# Patient Record
Sex: Female | Born: 1955 | Race: White | Hispanic: No | Marital: Married | Smoking: Never smoker
Health system: Southern US, Community
[De-identification: ages and names within clinical notes are randomized; demographics above are authoritative.]

## PROBLEM LIST (undated history)

## (undated) DIAGNOSIS — K589 Irritable bowel syndrome without diarrhea: Secondary | ICD-10-CM

## (undated) DIAGNOSIS — E2740 Unspecified adrenocortical insufficiency: Secondary | ICD-10-CM

## (undated) DIAGNOSIS — G4730 Sleep apnea, unspecified: Secondary | ICD-10-CM

## (undated) DIAGNOSIS — Z9889 Other specified postprocedural states: Secondary | ICD-10-CM

## (undated) DIAGNOSIS — I456 Pre-excitation syndrome: Secondary | ICD-10-CM

## (undated) DIAGNOSIS — K219 Gastro-esophageal reflux disease without esophagitis: Secondary | ICD-10-CM

## (undated) DIAGNOSIS — H269 Unspecified cataract: Secondary | ICD-10-CM

## (undated) DIAGNOSIS — D649 Anemia, unspecified: Secondary | ICD-10-CM

## (undated) DIAGNOSIS — H049 Disorder of lacrimal system, unspecified: Secondary | ICD-10-CM

## (undated) DIAGNOSIS — C4491 Basal cell carcinoma of skin, unspecified: Secondary | ICD-10-CM

## (undated) DIAGNOSIS — I639 Cerebral infarction, unspecified: Secondary | ICD-10-CM

## (undated) DIAGNOSIS — R059 Cough, unspecified: Secondary | ICD-10-CM

## (undated) DIAGNOSIS — N390 Urinary tract infection, site not specified: Secondary | ICD-10-CM

## (undated) DIAGNOSIS — I4891 Unspecified atrial fibrillation: Secondary | ICD-10-CM

## (undated) DIAGNOSIS — Z8619 Personal history of other infectious and parasitic diseases: Secondary | ICD-10-CM

## (undated) DIAGNOSIS — I509 Heart failure, unspecified: Secondary | ICD-10-CM

## (undated) DIAGNOSIS — I10 Essential (primary) hypertension: Secondary | ICD-10-CM

## (undated) DIAGNOSIS — Z87440 Personal history of urinary (tract) infections: Secondary | ICD-10-CM

## (undated) DIAGNOSIS — Z87442 Personal history of urinary calculi: Secondary | ICD-10-CM

## (undated) DIAGNOSIS — R112 Nausea with vomiting, unspecified: Secondary | ICD-10-CM

## (undated) DIAGNOSIS — F419 Anxiety disorder, unspecified: Secondary | ICD-10-CM

## (undated) DIAGNOSIS — E785 Hyperlipidemia, unspecified: Secondary | ICD-10-CM

## (undated) DIAGNOSIS — R05 Cough: Secondary | ICD-10-CM

## (undated) DIAGNOSIS — N183 Chronic kidney disease, stage 3 unspecified: Secondary | ICD-10-CM

## (undated) DIAGNOSIS — M1990 Unspecified osteoarthritis, unspecified site: Secondary | ICD-10-CM

## (undated) HISTORY — PX: URETEROSCOPY: SHX842

## (undated) HISTORY — PX: APPENDECTOMY: SHX54

## (undated) HISTORY — DX: Irritable bowel syndrome, unspecified: K58.9

## (undated) HISTORY — DX: Unspecified atrial fibrillation: I48.91

## (undated) HISTORY — PX: SPINE SURGERY: SHX786

## (undated) HISTORY — PX: CARDIAC ELECTROPHYSIOLOGY STUDY AND ABLATION: SHX1294

## (undated) HISTORY — PX: CERVICAL SPINE SURGERY: SHX589

## (undated) HISTORY — DX: Unspecified cataract: H26.9

## (undated) HISTORY — DX: Hyperlipidemia, unspecified: E78.5

## (undated) HISTORY — DX: Unspecified osteoarthritis, unspecified site: M19.90

## (undated) HISTORY — DX: Pre-excitation syndrome: I45.6

## (undated) HISTORY — DX: Essential (primary) hypertension: I10

## (undated) HISTORY — PX: CARPAL TUNNEL RELEASE: SHX101

## (undated) HISTORY — PX: TOTAL HIP ARTHROPLASTY: SHX124

## (undated) HISTORY — PX: EYE SURGERY: SHX253

## (undated) HISTORY — PX: COLON SURGERY: SHX602

## (undated) HISTORY — DX: Disorder of lacrimal system, unspecified: H04.9

## (undated) HISTORY — PX: JOINT REPLACEMENT: SHX530

## (undated) HISTORY — PX: EYELID CARCINOMA EXCISION: SHX1563

## (undated) HISTORY — PX: OTHER SURGICAL HISTORY: SHX169

## (undated) HISTORY — PX: TOTAL ABDOMINAL HYSTERECTOMY: SHX209

## (undated) SURGERY — CYSTOSCOPY, WITH STENT INSERTION
Anesthesia: Choice | Laterality: Left

---

## 1998-02-09 DIAGNOSIS — Z8619 Personal history of other infectious and parasitic diseases: Secondary | ICD-10-CM

## 1998-02-09 HISTORY — DX: Personal history of other infectious and parasitic diseases: Z86.19

## 2000-02-11 ENCOUNTER — Other Ambulatory Visit: Admission: RE | Admit: 2000-02-11 | Discharge: 2000-02-11 | Payer: Self-pay | Admitting: Obstetrics and Gynecology

## 2001-09-05 ENCOUNTER — Other Ambulatory Visit: Admission: RE | Admit: 2001-09-05 | Discharge: 2001-09-05 | Payer: Self-pay | Admitting: Obstetrics and Gynecology

## 2002-11-09 ENCOUNTER — Other Ambulatory Visit: Admission: RE | Admit: 2002-11-09 | Discharge: 2002-11-09 | Payer: Self-pay | Admitting: Obstetrics and Gynecology

## 2003-11-19 ENCOUNTER — Other Ambulatory Visit: Admission: RE | Admit: 2003-11-19 | Discharge: 2003-11-19 | Payer: Self-pay | Admitting: Obstetrics and Gynecology

## 2004-04-15 ENCOUNTER — Ambulatory Visit (HOSPITAL_COMMUNITY): Admission: RE | Admit: 2004-04-15 | Discharge: 2004-04-15 | Payer: Self-pay | Admitting: Orthopedic Surgery

## 2006-11-26 ENCOUNTER — Encounter: Admission: RE | Admit: 2006-11-26 | Discharge: 2006-11-26 | Payer: Self-pay | Admitting: Orthopedic Surgery

## 2007-01-04 ENCOUNTER — Inpatient Hospital Stay (HOSPITAL_COMMUNITY): Admission: RE | Admit: 2007-01-04 | Discharge: 2007-01-07 | Payer: Self-pay | Admitting: Orthopedic Surgery

## 2007-07-05 ENCOUNTER — Encounter: Admission: RE | Admit: 2007-07-05 | Discharge: 2007-07-05 | Payer: Self-pay | Admitting: Orthopedic Surgery

## 2007-08-02 ENCOUNTER — Inpatient Hospital Stay (HOSPITAL_COMMUNITY): Admission: RE | Admit: 2007-08-02 | Discharge: 2007-08-05 | Payer: Self-pay | Admitting: Orthopedic Surgery

## 2007-12-15 ENCOUNTER — Ambulatory Visit (HOSPITAL_BASED_OUTPATIENT_CLINIC_OR_DEPARTMENT_OTHER): Admission: RE | Admit: 2007-12-15 | Discharge: 2007-12-15 | Payer: Self-pay | Admitting: Family Medicine

## 2009-07-18 ENCOUNTER — Encounter: Admission: RE | Admit: 2009-07-18 | Discharge: 2009-07-18 | Payer: Self-pay | Admitting: Orthopedic Surgery

## 2009-08-08 ENCOUNTER — Ambulatory Visit (HOSPITAL_COMMUNITY): Admission: RE | Admit: 2009-08-08 | Discharge: 2009-08-08 | Payer: Self-pay | Admitting: Orthopedic Surgery

## 2010-03-02 ENCOUNTER — Encounter: Payer: Self-pay | Admitting: Orthopedic Surgery

## 2010-06-24 NOTE — Op Note (Signed)
Connie Haynes, Connie Haynes                  ACCOUNT NO.:  [id number]   MEDICAL RECORD NO.:  [id number]          PATIENT TYPE:  INP   LOCATION:  0006                         FACILITY:  [facility]   PHYSICIAN:  Madlyn Frankel. Charlann Boxer, M.D.  DATE OF BIRTH:  09-Feb-1956   DATE OF PROCEDURE:  08/02/2007  DATE OF DISCHARGE:                               OPERATIVE REPORT   PREOPERATIVE DIAGNOSIS:  Left hip osteoarthritis.   POSTOPERATIVE DIAGNOSIS:  Left hip osteoarthritis.   PROCEDURE:  Left total hip replacement.   COMPONENTS USED:  DePuy hip system size 50 pinnacle cup, a 36 metal  neutral liner, size 2 high Trilock stem with 36 +1.5 ceramic ball.   SURGEON:  Madlyn Frankel. Charlann Boxer, M.D.   ASSISTANT:  [name]. Mann, PA.   ANESTHESIA:  General.   BLOOD LOSS:  450.   DRAINS:  One.   COMPLICATIONS:  None.   INDICATIONS FOR PROCEDURE:  Ms. Deemer is a 55 year old female with  history of bilateral hip osteoarthritis.  She is status post a right hip  resurfacing that failed and was ultimately revised to a hip replacement.  She is here for complaints of left hip pain and wished to proceed with  hip replacement options versus resurfacing.  Given the results she had  with the right hip, she is eager to get this taken care of due to her  pain.  Risks and benefits reviewed including failure of component,  infection, DVT, she had been through this before and consent obtained.   PROCEDURE IN DETAIL:  The patient was brought to operative theater.  Once adequate anesthesia, preoperative antibiotics 2 mg of Ancef  administered, the patient was positioned in the right lateral decubitus  position with left side up.  Bony prominences were padded.  Left lower  extremity was then prescrubbed, prepped and draped in sterile fashion.  A lateral incision was made.  Sharp dissection was carried to the  iliotibial band which was then incised posteriorly into the gluteal  fascia.  The short external rotators were taken down  separate from the  posterior capsule.  Initially, I created an L capsulotomy preserving the  posterior leaflet to protect the sciatic nerve and was attempting to  repair it at the end of the case; however, due to debridement from  reamers the superior capsule was not available.  The hip was very stable  and deemed not necessary, so capsulectomy carried out at the end.  Hip  was dislocated and a neck osteotomy made based off anatomic landmarks  and reviewed under radiographs.  I checked with a trial after the  resection and was happy that the neck cut matched up to the neck length  and head size.   At this point, we attended to the femur.  Femoral retractors were  placed.  I used a starting drill and a box osteotome to assure  laterality of my component.  I hand reamed once irrigated to prevent fat  emboli.  I broached up to a size 2 broach with a 0 setting my  anteversion at 20-25 degrees.  Once I had broached up and this had a  good firm fit, I cleaned up the neck cut with a calcar planer.  I then  packed femur into the acetabulum.   Acetabular retractors were placed and labrectomy carried out and I began  reaming with a 44 reamer straight and with curved reamers up to 49  reamer with good bony bed preparation.  I subsequently impacted a 50 mm  pinnacle cup at 20 degrees of forward flexion and 35-40 degrees of  abduction.  It appeared to be positioned anatomically with good bony  coverage.  A single cancellus screw was used to support the cup.  I  placed a 36 trial liner and sent it back to the femur.  Trial reduction  was carried.  I first used a standard neck based on radiographs.  There  was touch bit of impingement with internal rotation and thus I chose to  use the offset neck and with this with a +1.5 I still had a millimeter  or so of shuck, the hip range of motion was very stable and the leg  lengths were very comparable to the down leg as I compared it in the  preoperative  position.   Given all these parameters, all trial components were removed, the  central hole then replaced in the pinnacle shell and the final 36 metal  liner impacted without any difficulties or complications.   The final 2 high Trilock stem was then impacted.  It sat at the level  where the neck cut was made and had very good torsional control and  stability.  Based on this and my trial reduction, I chose to use the 1.5  ceramic ball to provide her with a hard bearing surface but decrease  metal liner debris.  This was impacted onto the trunnion and the hip  reduced.  Hip was irrigated throughout the case and again at this point.  Again, I attempted to reapproximate the posterior capsule and it was not  positive, so I removed the posterior capsule to prevent impingement.  Medium Hemovac drain was placed deep.  The iliotibial band was  reapproximated using #1 Ethibond, #1 Vicryl was then used on the gluteal  fascia.  The wound was closed with 2-0 Vicryl and running 4-0 Monocryl.  Hip was cleaned, dried and dressed sterilely with Steri-Strips and  sterile dressing.  She was brought to the recovery room in stable  condition tolerating the procedure well.      Madlyn Frankel Charlann Boxer, M.D.  Electronically Signed     MDO/MEDQ  D:  08/02/2007  T:  08/02/2007  Job:  [id number]

## 2010-06-24 NOTE — H&P (Signed)
Connie Haynes, Connie Haynes                  ACCOUNT NO.:  [id number]   MEDICAL RECORD NO.:  [id number]          PATIENT TYPE:  INP   LOCATION:  NA                           FACILITY:  [facility]   PHYSICIAN:  Madlyn Frankel. Charlann Boxer, M.D.  DATE OF BIRTH:  October 15, 1955   DATE OF ADMISSION:  08/02/2007  DATE OF DISCHARGE:                              HISTORY & PHYSICAL   PROCEDURE:  Left total hip arthroplasty.   CHIEF COMPLAINT:  Left hip pain.   HISTORY OF PRESENT ILLNESS:  A 55 year old female with a history of left  hip pain secondary to osteoarthritis.  It has been refractory to all  conservative treatment.  It has required her to use a cane for assisted  ambulation.  She also has a history of right total hip replacement and  has done well with this.   PRIMARY CARE PHYSICIAN:  Birdena Jubilee, MD.   SPECIALIST:  Dr. Luella Cook.   PAST MEDICAL HISTORY:  1. Significant for osteoarthritis.  2. Hypertension.  3. Wolff-Parkinson-White syndrome.   PAST SURGICAL HISTORY:  1. Right hip resurfacing which failed requiring right hip replacement      by Dr. Charlann Boxer.  2. Hysterectomy.  3. Colon surgery.  4. Ablation for Wolff-Parkinson-White back in 1993.   FAMILY HISTORY:  Congestive heart failure, cancer.   SOCIAL HISTORY:  She is married.  She works as a Architectural technologist.  Primary caregiver will be her husband after surgery.   DRUG ALLERGIES:  1. CODEINE causes severe nausea and vomiting.  2. She also had nausea and vomiting after ANESTHESIA last time.   CURRENT MEDICATIONS:  1. Benicar 40/25 one p.o. daily.  2. Premarin 0.3 p.o. daily.  3. Amitiza 24 mcg two times a day with food.  4. Celebrex 200 mg one p.o. daily.  5. Multivitamin p.o. daily.   REVIEW OF SYSTEMS:  None other than the HPI.   PHYSICAL EXAMINATION:  GENERAL:  Awake, alert and oriented, well-  developed, well-nourished in no acute distress.  VITAL SIGNS:  Pulse 72, respirations 18, blood pressure 118/74.  NECK:  Supple.  No  carotid bruits.  CHEST:  Lungs are clear to auscultation bilaterally.  BREASTS:  Deferred.  HEART:  Regular rate and rhythm.  S1-S2 distinct.  ABDOMEN:  Soft, nontender, nondistended.  Bowel sounds are present.  GENITOURINARY:  Deferred.  EXTREMITIES:  Left hip has decreased range of motion, increased pain  with motion.  SKIN:  No cellulitis.  NEUROLOGIC:  Intact distal sensibilities.   DIAGNOSTICS:  Labs, EKG, chest x-ray all pending presurgical testing.   IMPRESSION:  Left hip osteoarthritis.   PLAN OF ACTION:  Left total hip arthroplasty at [facility] by  surgeon Dr. Durene Romans on August 02, 2007.  Risks and complications were  discussed.   PLAN:  1. Postoperative medications were provided at time of history and      physical including Lovenox Robaxin, iron, aspirin, Colace, MiraLax.      Pain medicines will be provided after surgery.  2. Pain control will be to utilize Toradol plus Darvocet as she  has      severe nausea and vomiting with any codeine or codeine derivatives.      At time of discharge, more than likely with utilize Darvocet for      pain control.     ______________________________  [name]. [address], [place]      Madlyn Frankel. Charlann Boxer, M.D.  Electronically Signed    BLM/MEDQ  D:  07/29/2007  T:  07/29/2007  Job:  [id number]   cc:   Birdena Jubilee, MD  Fax: [phone]

## 2010-06-24 NOTE — H&P (Signed)
NAMEELENORA, Connie Haynes                  ACCOUNT NO.:  [id number]   MEDICAL RECORD NO.:  [id number]          PATIENT TYPE:  INP   LOCATION:  NA                           FACILITY:  [facility]   PHYSICIAN:  Madlyn Frankel. Charlann Boxer, M.D.  DATE OF BIRTH:  March 08, 1955   DATE OF ADMISSION:  01/04/2007  DATE OF DISCHARGE:                              HISTORY & PHYSICAL   PROCEDURE:  Conversion right total hip arthroplasty.   CHIEF COMPLAINTS:  Right hip pain.   HISTORY OF PRESENT ILLNESS:  A 55 year old female seen in our office  with right hip and groin pain after a history of right hip resurfacing  surgery in 2007.  She has had a significant pain, 100% worse than she  was before surgery.  There has been no improvement in her overall pain  relief, strengthening, function, and has decreased quality of life as a  result.  There has been no source of infection noted with laboratory  testing.  After significant discussion with the patient, surgery with  conversion to total hip replacement would be needed to improve quality  of life and function.  She was presurgically assessed by her primary  care physician, Dr. Birdena Jubilee, with referral for full cardiology  workup before surgery.   PAST MEDICAL HISTORY:  Significant for:  1. Osteoarthritis.  2. Hypertension.  3. Wolff-Parkinson-White.   PAST SURGICAL HISTORY:  1. Detached colon 1985.  2. Hysterectomy 1989.  3. Ablation 1993.  4. Hip resurfacing 2007.   FAMILY HISTORY:  Heart disease, hypertension, cancer, arthritis.   SOCIAL HISTORY:  Married.  Primary caregiver after surgery will be  husband and friends.   DRUG ALLERGIES:  CODEINE.   MEDICATIONS:  1. Celebrex 200 mg p.o. b.i.d.  2. Premarin 0.3 mg daily.  3. Benicar 40/25 one p.o. daily.  4. Amitiza 24 mcg p.o. daily.  5. Multivitamin daily.   REVIEW OF SYSTEMS:  PULMONARY:  History of bronchitis and cough.  GENITOURINARY:  History of kidney stones.  Otherwise, see HPI.   PHYSICAL  EXAMINATION:  Pulse 72, respirations 18, blood pressure 114/76.  GENERAL:  Awake, alert and oriented, well-developed, well-nourished, in  no acute distress.  NECK:  Supple.  No carotid bruits.  CHEST:  Lungs clear to auscultation bilaterally.  BREASTS:  Deferred.  HEART:  Regular rate and rhythm without murmurs.  ABDOMEN:  Soft, nontender, nondistended.  Bowel sounds present.  GENITOURINARY:  Deferred.  EXTREMITIES:  Decreased range of motion right hip with increased pain  with any range of motion.  SKIN:  Old posterolateral incision well healed.  No cellulitis.  NEUROLOGIC:  Intact distal sensibilities.   LABORATORIES:  EKG attached with stress echocardiogram to follow.  Chest  x-ray pending.   IMPRESSION:  1. Failed right hip resurfacing.  2. Hypertension.  3. Wolff-Parkinson-White.   PLAN OF ACTION:  Conversion right total hip arthroplasty by surgeon Dr.  Durene Romans.  Risks and complications were discussed with Dr. Charlann Boxer.   Postoperative medications including Lovenox, Robaxin, iron, Colace,  MiraLax provided at time of history and physical.  Postoperative pain  medication will be provided at time of surgery.     ______________________________  [name] [address], [place]      Madlyn Frankel. Charlann Boxer, M.D.  Electronically Signed    BLM/MEDQ  D:  12/30/2006  T:  12/31/2006  Job:  [id number]   cc:   Birdena Jubilee, MD  Fax: [phone]

## 2010-06-24 NOTE — Op Note (Signed)
Connie Haynes, Connie Haynes                  ACCOUNT NO.:  [id number]   MEDICAL RECORD NO.:  [id number]          PATIENT TYPE:  INP   LOCATION:  X004                         FACILITY:  [facility]   PHYSICIAN:  Madlyn Frankel. Charlann Boxer, M.D.  DATE OF BIRTH:  Feb 14, 1955   DATE OF PROCEDURE:  01/04/2007  DATE OF DISCHARGE:                               OPERATIVE REPORT   PREOPERATIVE DIAGNOSIS:  Failed right hip resurfacing   POSTOPERATIVE DIAGNOSIS:  Failed right hip resurfacing   PROCEDURE:  Revision right total hip replacement with a Biomet hip  system, size 54 Regenerex cup, single cancellous screw, a 36 +5 vitamin  E knee polyethylene insert, neutral with a size 5 standard taper lock  stem with 36+ plus 0 Delta ceramic ball.   SURGEON:  Madlyn Frankel. Charlann Boxer, M.D.   ASSISTANT:  Dwyane Luo.   ANESTHESIA:  Spinal.   BLOOD LOSS:  500 mL.   DRAINS:  x1.   COMPLICATIONS:  None.   INDICATIONS FOR THE PROCEDURE:  Connie Haynes is a very pleasant 56 year old  female status post a right hip resurfacing done over a year ago with  persistent pain and inability to function normally.  Radiographs were  concerning for malposition of the cup versus complications related to  implant insertion.   I reviewed with her the concern, rule out infection of the perioperative  plate.  We reviewed the risks of persistent discomfort, persistent  weakness, persistent pain as well as increased risk with revision  surgery of dislocation, infection.  Consent was obtained for the benefit  of pain relief and increased function.   PROCEDURE IN DETAIL:  The patient was brought to operative theater.  Once adequate anesthesia and preoperative antibiotics, Ancef, were  administered, the patient was positioned in the left lateral decubitus  position with the right side up.  The right lower extremity prescrubbed  and prepped and draped in a sterile fashion.  A portion of her lateral  incision was made and sharp dissection was carried down to  the  iliotibial band, and gluteus fascia was incised posteriorly for  posterior approach.  A pseudocapsular layer was identified and taken  down off the posterior aspect of the hip joint and as well as at the  border the gluteus medius.  Flap was created and joint identified.  Using a bone hook, I was able to dislocate the hip joint.  It was  immediately evident at this point there was no fracture, and there was  no evidence of an obviously loose cup or femoral head component.  At  this point, I made a neck osteotomy into the trochanteric fossa.  I used  an oscillating saw down to the stem portion of this resurfaced femoral  component and then used a sharp osteotome to debride the remaining neck  without complication.  At this point, exposure of the femur was carried  out with the debridement of synovium and capsule around the area.  With  the femur exposed, I would used a starting drill and then a hand reamer  to open up the femoral canal.  Then began broaching with a size 5  broach.  This broach sat very tight within the proximal calcar.  I  milled the proximal area to match this of the neck so I could better fit  my trial components.  Following placement of this, I did attend down to  the acetabulum, with the femur rotated anteriorly, I was able to expose  the acetabulum without difficulty.  Synovial capsular debridement was  carried out as necessary preserving the posterior leaflet for protection  against the sciatic nerve with retractors and then later to be used in  anatomic repair.  Cup was exposed and the rim identified.  I used an  osteotome.  After approximately 15-20 minutes of work with care not to  cause bone loss, I was able to remove the cup.  It did have some  fixation, but there was no significant bone loss upon removal.  At this  point, I began reaming with a 51 reamer.  She had a 52 cup in place.  I  reamed up to 53 reamer at which point I felt there was good bony   fixation.  I chose a 54 Regenerex cup to improve my fixation with the  holes placed anterior.  The cup had initial excellent scratch fit at 35-  40 degrees of abduction, 20 degrees of flexion.  Single cancellous screw  was used to augment this with an excellent purchase.  Trial liner was  placed, and attention was now directed to a trial reduction.  With a  size 5 femoral stem at basically 20 degrees of anteversion, I used a  standard neck and a 36 +0 ball.  The hip was very stable.  Leg lengths  appeared to be more equal.  She was probably at least a centimeter short  on this right side preoperatively.   At this point, all trial components were removed.  The final size 5  standard stem was then impacted into the level of the neck cut.  The e-  poly 36 +5 neutral liner was impacted into the shell without difficulty.  Based on the position of the trials and the position of the final  components, the final 36 +0 standard ceramic ball was impacted on a  clean and dried trunnion and the hip reduced.  Hip stability was noted  to be as good as the trial reduction.  I irrigated the wound throughout  the case and again at this point.  The posterior pseudocapsular layer  was reapproximated to the superior pseudocapsule layer.   A medium Hemovac drain was placed deep.  The iliotibial band and gluteal  fascia was reapproximated using a #1 Vicryl.  The remaining wound was  closed with 2-0 Vicryl and a running 4-0 Monocryl.  The hip was cleaned,  dried and dressed sterilely with Steri-Strips and a Mepilex dressing.  She was brought to the recovery room in stable condition.      Madlyn Frankel Charlann Boxer, M.D.  Electronically Signed     MDO/MEDQ  D:  01/04/2007  T:  01/04/2007  Job:  [id number]

## 2010-06-27 NOTE — Discharge Summary (Signed)
NAMETALULA, ISLAND                  ACCOUNT NO.:  [id number]   MEDICAL RECORD NO.:  [id number]          PATIENT TYPE:  INP   LOCATION:  1621                         FACILITY:  [facility]   PHYSICIAN:  Madlyn Frankel. Charlann Boxer, M.D.  DATE OF BIRTH:  02-12-1955   DATE OF ADMISSION:  01/04/2007  DATE OF DISCHARGE:  01/07/2007                               DISCHARGE SUMMARY   ADMITTING DIAGNOSES:  1. Osteoarthritis status post right hip resurfacing.  2. Hypertension.  3. Wolff-Parkinson-White.   DISCHARGE DIAGNOSES:  1. Osteoarthritis.  2. Hypertension.  3. Wolff-Parkinson-White.  4. Postoperative hypokalemia.   HISTORY OF PRESENT ILLNESS:  [name] is a very pleasant 55 year old  female with a history of right hip and groin pain after history of hip  resurfacing in 2007.  She had a significant amount of pain, 100% worse  than she was before surgery.  She had no improvement in overall pain  relief.  Her strength and functioning and quality of life were all  decreased.  No source of infection noted with laboratory testing.  After  significant discussion with the patient, surgery with converting to  total hip replacement was needed to improve quality of life and  function.  She was presurgically assessed by her primary care physician,  Dr. Birdena Jubilee, with referral for full cardiology workup before  surgery.   LABORATORIES:  Preadmission CBC, hematocrit 37.7; at discharge was 25.3  and stable.  Coags all negative.  Routine chemistry:  Glucose 102, all  others normal.  During her hospital stay her potassium dipped down to  3.3 and at discharge her sodium was 134, potassium 3.3, glucose was 120.  Kidney function normal, GFR greater than 60, her calcium was 7.9 at  discharge.  Routine chemistry, GI workup negative.  UA showed cloudy,  small leukocyte esterase, many epithelials and some hyaline casts.   RADIOLOGY:  Chest two-view:  No active disease.  Pelvis one-view:  Right  total hip replacement  in satisfactory position.   CARDIOLOGY:  EKG showed nonspecific changes unchanged from previous EKG  and cleared by cardiology.   PROCEDURE:  Revision right total hip replacement.  Surgeon:  Dr. Durene Romans.  Assistant:  Dwyane Luo, PA-C.  Components were metal on poly.   HOSPITAL COURSE:  The patient underwent revision right total hip  replacement, tolerated procedure well, was admitted to orthopedic floor.  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  She did remain afebrile throughout her course of  stay.  She states she felt much better even on postoperative day #1  compared with her last surgery.  Hemovac was pulled, dressing was  changed on a daily basis after postoperative day #1.  There was no  significant drainage from the wound at any time.  She was  neurovascularly intact of her right lower extremity throughout.  She was  weightbearing as tolerated during her physical therapy.  We held her  blood pressure medicines and gave her some K-Dur.  Day #2, again  replaced a little bit of potassium.  She was afebrile.  She was walking  without pain.  Day #3, doing extremely well, ready to go home.   DISCHARGE DISPOSITION:  Stable and improved condition with home health  care PT.   DISCHARGE PHYSICAL THERAPY:  She is weightbearing as tolerated with the  use of a rolling walker.   DISCHARGE DIET:  No restrictions.   DISCHARGE WOUND CARE:  Keep dry.   DISCHARGE MEDICATIONS:  1. Lovenox 40 mg subcu q.24h. x11 days.  2. Robaxin 500 mg p.o. q.6h.  3. Iron 325 mg one p.o. t.i.d. x3 weeks.  4. Enteric-coated aspirin 325 mg one p.o. daily x4 weeks after Lovenox      completed.  5. Colace 100 mg p.o. b.i.d.  6. MiraLax 17 g p.o. daily.  7. Vicodin 5/325 one to two p.o. q.4-6h. p.r.n. pain.  8. Premarin 0.3 mg p.o. q.a.m.  9. Benicar 40/25 one p.o. q.a.m.  10.Amitiza 24 mcg one b.i.d.  11.Celebrex 200 mg one p.o. b.i.d. x1 week.   12.Multivitamin daily.   DISCHARGE FOLLOWUP:  Follow up with Dr. Charlann Boxer at phone number [phone].     ______________________________  [name]. [address], [place]      Madlyn Frankel. Charlann Boxer, M.D.  Electronically Signed    BLM/MEDQ  D:  01/18/2007  T:  01/18/2007  Job:  [id number]   cc:   Birdena Jubilee, MD  Fax: [phone]

## 2010-06-27 NOTE — Discharge Summary (Signed)
Connie Haynes, [name]                  ACCOUNT NO.:  [id number]   MEDICAL RECORD NO.:  [id number]          PATIENT TYPE:  INP   LOCATION:  1607                         FACILITY:  [facility]   PHYSICIAN:  Madlyn Frankel. Charlann Boxer, M.D.  DATE OF BIRTH:  1955-03-30   DATE OF ADMISSION:  08/02/2007  DATE OF DISCHARGE:  08/05/2007                               DISCHARGE SUMMARY   ADMITTING DIAGNOSES:  1. Osteoarthritis.  2. Hypertension.  3. Wolff-Parkinson-White syndrome.   DISCHARGE DIAGNOSES:  1. Osteoarthritis.  2. Hypertension.  3. Wolff-Parkinson-White syndrome.   HISTORY OF PRESENT ILLNESS:  A 55 year old female with a history of left  hip pain secondary to osteoarthritis.  Refractory to all conservative  treatment.   PROCEDURE:  Left total hip arthroplasty by surgeon Dr. Durene Romans.  Assistant, Dwyane Luo, PA-C.   LABS PREADMISSION:  CBC, hemoglobin/hematocrit, platelets respectively  were 13.7, 39.6, and 252,000.  At time of discharge hemoglobin 9.2,  hematocrit 26.8, and platelets 200,000.  White cell differential on  admission was all within normal limits.  Coagulation, her PTT was 23,  otherwise all normal.  Routine chemistry on admission sodium 139,  potassium 3.7, glucose 79, creatinine 0.86.  Time of discharge sodium  137, potassium 3.8, glucose 96, creatinine 0.79.  Kidney function, GFR  greater than 60 throughout, her calcium is 8.4 at time of discharge.  UA  showed a trace leukocyte esterase and some hyaline casts.   Cardiology EKG normal sinus rhythm.   HOSPITAL COURSE:  The patient admitted to hospital underwent left total  hip arthroplasty, tolerated procedure well.  She remained  hemodynamically stable throughout the course of stay.  Day 1, we pulled  her Hemovac drain from her hip.  The dressing was dry.  She is  neurovascularly intact to the left lower extremity.  She was  weightbearing as tolerated.  She made good progress with physical  therapy.  Pain was  well-controlled with her pain management regimen.  She had minimal nausea and vomiting.  She did extremely well.  By day 3  was stable.  Made good progress in physical therapy and was ready to go  home.   DISCHARGE DISPOSITION:  Discharged home, stable improved condition.   DISCHARGE PHYSICAL THERAPY:  Weightbearing as tolerated with use of  rolling walker.   DISCHARGE DIET:  Regular.   DISCHARGE WOUND CARE:  Keep dry.   DISCHARGE MEDICATIONS:  1. Lovenox 40 mg subcu q.24 times 11 days.  2. Robaxin 500 mg q. p.o. q.6.  3. Iron 325 mg p.o. t.i.d. x2 weeks.  4. Aspirin 325 mg p.o. daily x4 weeks after Lovenox.  5. Colace 100 mg p.o. b.i.d.  6. MiraLax 17 grams p.o. daily.  7. Darvocet N 100 one to two p.o. q.4 to 6 p.r.n. moderate pain.  8. Dilaudid 2 mg 1 to 2 p.o. q.4 to 6 p.r.n. severe pain.  9. Celebrex 2 mg p.o. b.i.d.  10.Multivitamin daily.  11.Benicar 40/25 one p.o. q. a.m.  12.Premarin 0.3 mg p.o. q. a.m.  13.Amitiza 24 mg IV as twice daily.   DISCHARGE  FOLLOWUP:  Follow up with Dr. Charlann Boxer at phone number [phone] in  2 weeks for wound check.     ______________________________  Connie Haynes. Connie Haynes, Connie Haynes      Madlyn Frankel. Charlann Boxer, M.D.  Electronically Signed    BLM/MEDQ  D:  08/29/2007  T:  08/29/2007  Job:  [id number]   cc:   Birdena Jubilee, MD  Fax: [phone]

## 2010-11-06 LAB — CBC
HCT: 26.8 — ABNORMAL LOW
HCT: 28.1 — ABNORMAL LOW
HCT: 39.6
Hemoglobin: 13.7
Hemoglobin: 9.2 — ABNORMAL LOW
Hemoglobin: 9.8 — ABNORMAL LOW
MCHC: 34.4
MCHC: 34.7
MCHC: 34.7
MCV: 88.7
MCV: 89.2
MCV: 89.4
Platelets: 200
Platelets: 215
Platelets: 252
RBC: 3 — ABNORMAL LOW
RBC: 3.16 — ABNORMAL LOW
RBC: 4.44
RDW: 13.4
RDW: 13.6
RDW: 13.6
WBC: 7.3
WBC: 7.4
WBC: 7.8

## 2010-11-06 LAB — TYPE AND SCREEN
ABO/RH(D): O POS
Antibody Screen: NEGATIVE

## 2010-11-06 LAB — URINE MICROSCOPIC-ADD ON

## 2010-11-06 LAB — BASIC METABOLIC PANEL
BUN: 14
BUN: 17
BUN: 22
CO2: 24
CO2: 26
CO2: 29
Calcium: 8.4
Calcium: 8.6
Calcium: 9.3
Chloride: 101
Chloride: 106
Chloride: 108
Creatinine, Ser: 0.79
Creatinine, Ser: 0.79
Creatinine, Ser: 0.86
GFR calc Af Amer: >60
GFR calc Af Amer: >60
GFR calc Af Amer: >60
GFR calc non Af Amer: >60
GFR calc non Af Amer: >60
GFR calc non Af Amer: >60
Glucose, Bld: 128 — ABNORMAL HIGH
Glucose, Bld: 79
Glucose, Bld: 96
Potassium: 3.7
Potassium: 3.8
Potassium: 4.2
Sodium: 137
Sodium: 139
Sodium: 139

## 2010-11-06 LAB — URINALYSIS, ROUTINE W REFLEX MICROSCOPIC
Bilirubin Urine: NEGATIVE
Glucose, UA: NEGATIVE
Hgb urine dipstick: NEGATIVE
Ketones, ur: NEGATIVE
Nitrite: NEGATIVE
Protein, ur: NEGATIVE
Specific Gravity, Urine: 1.017
Urobilinogen, UA: 0.2
pH: 5

## 2010-11-06 LAB — DIFFERENTIAL
Basophils Absolute: 0.1
Basophils Relative: 1
Eosinophils Absolute: 0.2
Eosinophils Relative: 3
Lymphocytes Relative: 30
Lymphs Abs: 2.2
Monocytes Absolute: 0.4
Monocytes Relative: 6
Neutro Abs: 4.4
Neutrophils Relative %: 60

## 2010-11-06 LAB — PROTIME-INR
INR: 0.9
Prothrombin Time: 12.2

## 2010-11-06 LAB — APTT: aPTT: 23 — ABNORMAL LOW

## 2010-11-18 LAB — COMPREHENSIVE METABOLIC PANEL
ALT: 18
AST: 17
Albumin: 4
Alkaline Phosphatase: 79
BUN: 19
CO2: 27
Calcium: 9.6
Chloride: 105
Creatinine, Ser: 0.86
GFR calc Af Amer: >60
GFR calc non Af Amer: >60
Glucose, Bld: 102 — ABNORMAL HIGH
Potassium: 4
Sodium: 140
Total Bilirubin: 0.8
Total Protein: 7.1

## 2010-11-18 LAB — PROTIME-INR
INR: 0.9
Prothrombin Time: 12.5

## 2010-11-18 LAB — URINALYSIS, ROUTINE W REFLEX MICROSCOPIC
Bilirubin Urine: NEGATIVE
Glucose, UA: NEGATIVE
Hgb urine dipstick: NEGATIVE
Ketones, ur: NEGATIVE
Nitrite: NEGATIVE
Protein, ur: NEGATIVE
Specific Gravity, Urine: 1.028
Urobilinogen, UA: 0.2
pH: 5

## 2010-11-18 LAB — CBC
HCT: 25.3 — ABNORMAL LOW
HCT: 26.2 — ABNORMAL LOW
HCT: 37.7
Hemoglobin: 13.1
Hemoglobin: 8.7 — ABNORMAL LOW
Hemoglobin: 9 — ABNORMAL LOW
MCHC: 34.2
MCHC: 34.5
MCHC: 34.7
MCV: 88.7
MCV: 89.6
MCV: 89.9
Platelets: 207
Platelets: 218
Platelets: 344
RBC: 2.83 — ABNORMAL LOW
RBC: 2.91 — ABNORMAL LOW
RBC: 4.25
RDW: 12.7
RDW: 12.7
RDW: 12.8
WBC: 5.6
WBC: 6
WBC: 9

## 2010-11-18 LAB — ABO/RH: ABO/RH(D): O POS

## 2010-11-18 LAB — BASIC METABOLIC PANEL
BUN: 9
BUN: 9
CO2: 26
CO2: 28
Calcium: 7.7 — ABNORMAL LOW
Calcium: 7.9 — ABNORMAL LOW
Chloride: 102
Chloride: 106
Creatinine, Ser: 0.65
Creatinine, Ser: 0.67
GFR calc Af Amer: >60
GFR calc Af Amer: >60
GFR calc non Af Amer: >60
GFR calc non Af Amer: >60
Glucose, Bld: 116 — ABNORMAL HIGH
Glucose, Bld: 120 — ABNORMAL HIGH
Potassium: 3.3 — ABNORMAL LOW
Potassium: 3.3 — ABNORMAL LOW
Sodium: 134 — ABNORMAL LOW
Sodium: 138

## 2010-11-18 LAB — URINE MICROSCOPIC-ADD ON

## 2010-11-18 LAB — TYPE AND SCREEN
ABO/RH(D): O POS
Antibody Screen: NEGATIVE

## 2010-11-18 LAB — APTT: aPTT: 27

## 2011-02-16 ENCOUNTER — Other Ambulatory Visit (HOSPITAL_BASED_OUTPATIENT_CLINIC_OR_DEPARTMENT_OTHER): Payer: Self-pay | Admitting: Family Medicine

## 2011-02-16 ENCOUNTER — Ambulatory Visit (HOSPITAL_BASED_OUTPATIENT_CLINIC_OR_DEPARTMENT_OTHER)
Admission: RE | Admit: 2011-02-16 | Discharge: 2011-02-16 | Disposition: A | Discharge status: Home / Self Care | Payer: BC Managed Care – PPO | Source: Ambulatory Visit | Attending: Family Medicine | Admitting: Family Medicine

## 2011-02-16 DIAGNOSIS — R05 Cough: Secondary | ICD-10-CM

## 2011-02-16 DIAGNOSIS — R0989 Other specified symptoms and signs involving the circulatory and respiratory systems: Secondary | ICD-10-CM

## 2011-02-16 DIAGNOSIS — R059 Cough, unspecified: Secondary | ICD-10-CM

## 2011-02-16 DIAGNOSIS — J8410 Pulmonary fibrosis, unspecified: Secondary | ICD-10-CM

## 2011-04-21 ENCOUNTER — Encounter: Payer: Self-pay | Admitting: *Deleted

## 2011-04-21 ENCOUNTER — Other Ambulatory Visit: Payer: BC Managed Care – PPO

## 2011-04-21 ENCOUNTER — Ambulatory Visit (INDEPENDENT_AMBULATORY_CARE_PROVIDER_SITE_OTHER): Payer: BC Managed Care – PPO | Admitting: Critical Care Medicine

## 2011-04-21 DIAGNOSIS — R05 Cough: Secondary | ICD-10-CM

## 2011-04-21 DIAGNOSIS — N200 Calculus of kidney: Secondary | ICD-10-CM | POA: Insufficient documentation

## 2011-04-21 DIAGNOSIS — E785 Hyperlipidemia, unspecified: Secondary | ICD-10-CM

## 2011-04-21 DIAGNOSIS — M1990 Unspecified osteoarthritis, unspecified site: Secondary | ICD-10-CM | POA: Insufficient documentation

## 2011-04-21 DIAGNOSIS — R059 Cough, unspecified: Secondary | ICD-10-CM

## 2011-04-21 DIAGNOSIS — K589 Irritable bowel syndrome without diarrhea: Secondary | ICD-10-CM | POA: Insufficient documentation

## 2011-04-21 DIAGNOSIS — I456 Pre-excitation syndrome: Secondary | ICD-10-CM

## 2011-04-21 DIAGNOSIS — I10 Essential (primary) hypertension: Secondary | ICD-10-CM

## 2011-04-21 MED ORDER — HYDROCOD POLST-CPM POLST ER 10-8 MG PO CP12: 1.0000 | ORAL_CAPSULE | Freq: Two times a day (BID) | ORAL | Status: DC | PRN

## 2011-04-21 MED ORDER — OMEPRAZOLE MAGNESIUM 20 MG PO TBEC: 20.0000 mg | DELAYED_RELEASE_TABLET | Freq: Two times a day (BID) | ORAL | Status: DC

## 2011-04-21 MED ORDER — PREDNISONE 10 MG PO TABS: ORAL_TABLET | ORAL | Status: DC

## 2011-04-21 MED ORDER — TRAMADOL HCL 50 MG PO TABS: ORAL_TABLET | ORAL | Status: DC

## 2011-04-21 MED ORDER — BENZONATATE 200 MG PO CAPS: ORAL_CAPSULE | ORAL | Status: DC

## 2011-04-21 NOTE — Patient Instructions (Addendum)
Use Tramadol Connie Haynes per cough protocol Labs today Stop dulera Stop fish oil Increase omeprazole to twice daily 1/2 before a meal Prednisone 10mg  Take 4 for two days three for two days two for two days one for two days Follow cough protocol Strict reflux diet Return 4 weeks

## 2011-04-21 NOTE — Progress Notes (Signed)
Subjective:    Patient ID: Connie Haynes, female    DOB: October 16, 1955, 56 y.o.   MRN: [id number] 56 y.o. WF with cyclical cough but no true asthma or rhinitis/sinusitis HPI Comments: Chronic cough since whooping cough episode in 2000 Now since 11/12 cough worsening . ? Etiology. Worse 12/12 and Rx abx and not improved 1/13 CXR not significant Now over past week worse.  Cough This is a chronic problem. The current episode started more than 1 year ago. The problem has been gradually worsening. The problem occurs every few minutes (Worse at night, and is nonstop then). The cough is non-productive. Associated symptoms include headaches, shortness of breath and wheezing. Pertinent negatives include no chest pain, chills, ear congestion, ear pain, fever, heartburn, hemoptysis, myalgias, nasal congestion, postnasal drip, rash, rhinorrhea or sore throat. Associated symptoms comments: Pt stays hoarse. The symptoms are aggravated by lying down and cold air. She has tried a beta-agonist inhaler and steroid inhaler (steroid shot has helped. dulera has not helped) for the symptoms. There is no history of asthma, bronchiectasis, bronchitis, COPD, emphysema, environmental allergies or pneumonia.    Past Medical History  Diagnosis Date  . Wolff-Parkinson-White (WPW) syndrome   . Hypertension   . Osteoarthritis   . Hyperlipidemia   . IBS (irritable bowel syndrome)   . Kidney stones      Family History  Problem Relation Age of Onset  . Leukemia Brother   . Heart disease Mother   . Heart disease Maternal Grandfather   . Hypertension Mother   . Hyperlipidemia Mother   . Cancer Paternal Grandfather   . Cancer Maternal Uncle     bone     History   Social History  . Marital Status: Married    Spouse Name: N/A    Number of Children: N/A  . Years of Education: N/A   Occupational History  . Geologist, engineering    Social History Main Topics  . Smoking status: Never Smoker   . Smokeless tobacco:  Never Used  . Alcohol Use: No  . Drug Use: No  . Sexually Active: Not on file   Other Topics Concern  . Not on file   Social History Narrative  . No narrative on file     Allergies  Allergen Reactions  . Codeine     vomiting     No outpatient prescriptions prior to visit.      Review of Systems  Constitutional: Positive for activity change and unexpected weight change. Negative for fever, chills, diaphoresis, appetite change and fatigue.  HENT: Positive for congestion and voice change. Negative for hearing loss, ear pain, nosebleeds, sore throat, facial swelling, rhinorrhea, sneezing, mouth sores, trouble swallowing, neck pain, neck stiffness, dental problem, postnasal drip, sinus pressure, tinnitus and ear discharge.   Eyes: Positive for discharge. Negative for photophobia, itching and visual disturbance.  Respiratory: Positive for cough, choking, chest tightness, shortness of breath and wheezing. Negative for apnea, hemoptysis and stridor.   Cardiovascular: Positive for palpitations. Negative for chest pain and leg swelling.  Gastrointestinal: Positive for vomiting. Negative for heartburn, nausea, abdominal pain, constipation, blood in stool and abdominal distention.  Genitourinary: Negative for dysuria, urgency, frequency, hematuria, flank pain, decreased urine volume and difficulty urinating.  Musculoskeletal: Positive for joint swelling and arthralgias. Negative for myalgias, back pain and gait problem.  Skin: Negative for color change, pallor and rash.  Neurological: Positive for dizziness, tremors, weakness, light-headedness and headaches. Negative for seizures, syncope, speech difficulty and numbness.  Hematological: Negative for environmental allergies and adenopathy. Does not bruise/bleed easily.  Psychiatric/Behavioral: Positive for sleep disturbance and agitation. Negative for confusion. The patient is not nervous/anxious.        Objective:   Physical Exam  Filed  Vitals:   04/21/11 1107  BP: 150/100  Pulse: 73  Temp: 97.6 F (36.4 C)  TempSrc: Oral  Height: 5\' 5"  (1.651 m)  Weight: 244 lb 9.6 oz (110.95 kg)  SpO2: 99%    Gen: Pleasant,obese , in no distress,  normal affect  ENT: No lesions,  mouth clear,  oropharynx clear, no postnasal drip  Neck: No JVD, no TMG, no carotid bruits  Lungs: No use of accessory muscles, no dullness to percussion, prominent pseudowheeze  Cardiovascular: RRR, heart sounds normal, no murmur or gallops, no peripheral edema  Abdomen: soft and NT, no HSM,  BS normal  Musculoskeletal: No deformities, no cyanosis or clubbing  Neuro: alert, non focal  Skin: Warm, no lesions or rashes   CXR : NAD        Assessment & Plan:   Cough Cyclical cough without true asthma No evident sinus disease Suspect due to upper airway instability and GERD/LPR Doubt mold is an issue but will check hypersens panel  Plan  Use Tramadol Kimberlee Nearing per cough protocol Labs today>>>rast assay/hypersensitivity panel Stop dulera Stop fish oil Increase omeprazole to twice daily 1/2 before a meal Prednisone 10mg  Take 4 for two days three for two days two for two days one for two days Strict reflux diet Return 4 weeks      Updated Medication List Outpatient Encounter Prescriptions as of 04/21/2011  Medication Sig Dispense Refill  . benzonatate (TESSALON) 200 MG capsule Take 1 every 4 hours per cough protocol  90 capsule  6  . Calcium-Vitamin D (CALTRATE 600 PLUS-VIT D PO) Take 1 tablet by mouth daily.      . celecoxib (CELEBREX) 200 MG capsule Take 200 mg by mouth daily.      . DULoxetine (CYMBALTA) 30 MG capsule Take 30 mg by mouth daily.      . ergocalciferol (VITAMIN D2) 50000 UNITS capsule Take 50,000 Units by mouth 2 (two) times a week.      . losartan-hydrochlorothiazide (HYZAAR) 100-12.5 MG per tablet Take 1 tablet by mouth 2 (two) times daily.      . Multiple Vitamin (MULTIVITAMIN) tablet Take 1 tablet by mouth  daily.      Marland Kitchen omeprazole (PRILOSEC OTC) 20 MG tablet Take 1 tablet (20 mg total) by mouth 2 (two) times daily.  60 tablet  6  . DISCONTD: benzonatate (TESSALON) 200 MG capsule Take 200 mg by mouth 3 (three) times daily as needed.      Marland Kitchen DISCONTD: mometasone-formoterol (DULERA) 100-5 MCG/ACT AERO Inhale 2 puffs into the lungs 2 (two) times daily.      Marland Kitchen DISCONTD: Omega-3 Fatty Acids (FISH OIL) 1200 MG CAPS Take 2 capsules by mouth daily.      Marland Kitchen DISCONTD: omeprazole (PRILOSEC OTC) 20 MG tablet Take 20 mg by mouth daily.      . predniSONE (DELTASONE) 10 MG tablet Take 4 for two days three for two days two for two days one for two days  20 tablet  0  . traMADol (ULTRAM) 50 MG tablet Take per cyclic cough protocol  1-2 every 4-6 hour  30 tablet  2  . DISCONTD: Hydrocod Polst-Chlorphen Polst (TUSSICAPS) 10-8 MG CP12 Take 1 capsule by mouth 2 (two) times daily as needed.  20 each  0

## 2011-04-21 NOTE — Assessment & Plan Note (Addendum)
Cyclical cough without true asthma No evident sinus disease Suspect due to upper airway instability and GERD/LPR Doubt mold is an issue but will check hypersens panel  Plan  Use Tramadol Kimberlee Nearing per cough protocol Labs today>>>rast assay/hypersensitivity panel Stop dulera Stop fish oil Increase omeprazole to twice daily 1/2 before a meal Prednisone 10mg  Take 4 for two days three for two days two for two days one for two days Strict reflux diet Return 4 weeks

## 2011-04-22 ENCOUNTER — Telehealth: Payer: Self-pay | Admitting: Critical Care Medicine

## 2011-04-22 LAB — ALLERGY FULL PROFILE
Allergen, D pternoyssinus,d7: <0.1 kU/L (ref <0.35)
Allergen,Goose feathers, e70: <0.1 kU/L (ref <0.35)
Alternaria Alternata: <0.1 kU/L (ref <0.35)
Aspergillus fumigatus, IgG: <0.1 kU/L (ref <0.35)
Bahia Grass: <0.1 kU/L (ref <0.35)
Bermuda Grass: <0.1 kU/L (ref <0.35)
Box Elder IgE: <0.1 kU/L (ref <0.35)
Candida Albicans: <0.1 kU/L (ref <0.35)
Cat Dander: <0.1 kU/L (ref <0.35)
Common Ragweed: <0.1 kU/L (ref <0.35)
Curvularia lunata: <0.1 kU/L (ref <0.35)
D. farinae: <0.1 kU/L (ref <0.35)
Dog Dander: <0.1 kU/L (ref <0.35)
Elm IgE: <0.1 kU/L (ref <0.35)
Fescue: <0.1 kU/L (ref <0.35)
G005 Rye, Perennial: <0.1 kU/L (ref <0.35)
G009 Red Top: <0.1 kU/L (ref <0.35)
Goldenrod: <0.1 kU/L (ref <0.35)
Helminthosporium halodes: <0.1 kU/L (ref <0.35)
House Dust Hollister: <0.1 kU/L (ref <0.35)
IgE (Immunoglobulin E), Serum: <1.5 IU/mL (ref 0.0–180.0)
Lamb's Quarters: <0.1 kU/L (ref <0.35)
Oak: <0.1 kU/L (ref <0.35)
Plantain: <0.1 kU/L (ref <0.35)
Stemphylium Botryosum: <0.1 kU/L (ref <0.35)
Sycamore Tree: <0.1 kU/L (ref <0.35)
Timothy Grass: <0.1 kU/L (ref <0.35)

## 2011-04-22 NOTE — Telephone Encounter (Signed)
Call pt and tell her allergy lab all negative for any severe allergies

## 2011-04-23 NOTE — Telephone Encounter (Signed)
Pt returned Crystal's call.  Holly D Pryor ° °

## 2011-04-23 NOTE — Telephone Encounter (Signed)
Pt returned call & stated she can be reached at [phone].  Connie Haynes

## 2011-04-23 NOTE — Telephone Encounter (Signed)
Called # provided below - [email or web address] 

## 2011-04-23 NOTE — Telephone Encounter (Signed)
Called pt's home and cell #'s - [email or web address] on both #'s

## 2011-04-24 NOTE — Telephone Encounter (Signed)
Pt returned Crystal's call.  Pt has asked that you call her at work, [phone].  Pt stated to tell whomever answers the phone that you MUST speak w/ her.  Antionette Fairy

## 2011-04-24 NOTE — Telephone Encounter (Signed)
Called, spoke with pt.  I informed her allergy lab all neg for any severe allergies per PW.  She verbalized understanding of this and voiced no further questions/concerns at this time.

## 2011-04-26 LAB — HYPERSENSITIVITY PNUEMONITIS PROFILE

## 2011-04-27 NOTE — Progress Notes (Signed)
Quick Note:  Call pt and tell her labs are ok, No change in medications No evidence of any allergies ______

## 2011-04-28 ENCOUNTER — Telehealth: Payer: Self-pay | Admitting: Critical Care Medicine

## 2011-04-28 NOTE — Progress Notes (Signed)
Quick Note:  Called pt's home # - [email or web address] ______ 

## 2011-04-28 NOTE — Telephone Encounter (Signed)
lmomtcb x1--looks like crystal has already given pt her results--not showing where anyone else has tried to call her

## 2011-04-29 NOTE — Telephone Encounter (Signed)
lmomtcb x 2  

## 2011-04-30 NOTE — Progress Notes (Signed)
Quick Note:  Called, spoke with pt. She is aware labs ar ok; no evidence of any allergies per PW. Advised no change in recs. She verbalized understanding of this and voiced no further questions/concerns at this time ______

## 2011-04-30 NOTE — Telephone Encounter (Signed)
LMTCB x 3 

## 2011-05-01 NOTE — Telephone Encounter (Signed)
Per protocol I will sign off on message and await call back. Jennifer Castillo, CMA  

## 2011-05-27 ENCOUNTER — Ambulatory Visit: Payer: BC Managed Care – PPO | Admitting: Critical Care Medicine

## 2011-06-04 ENCOUNTER — Ambulatory Visit (INDEPENDENT_AMBULATORY_CARE_PROVIDER_SITE_OTHER): Payer: BC Managed Care – PPO | Admitting: Adult Health

## 2011-06-04 ENCOUNTER — Encounter: Payer: Self-pay | Admitting: Adult Health

## 2011-06-04 VITALS — BP 118/64 | HR 68 | Temp 96.7°F | Ht 65.0 in | Wt 242.6 lb

## 2011-06-04 DIAGNOSIS — R05 Cough: Secondary | ICD-10-CM

## 2011-06-04 DIAGNOSIS — R059 Cough, unspecified: Secondary | ICD-10-CM

## 2011-06-04 MED ORDER — BENZONATATE 200 MG PO CAPS: ORAL_CAPSULE | ORAL | Status: DC

## 2011-06-04 MED ORDER — TRAMADOL HCL 50 MG PO TABS: ORAL_TABLET | ORAL | Status: AC

## 2011-06-04 NOTE — Progress Notes (Signed)
Subjective:    Patient ID: Connie Haynes, female    DOB: 07/03/1955, 56 y.o.   MRN: [id number] 56 y.o. WF with cyclical cough but no true asthma or rhinitis/sinusitis  04/21/11 Initial OV  Chronic cough since whooping cough episode in 2000.  Now since 11/12 cough worsening . ? Etiology. Worse 12/12 and Rx abx and not improved 1/13 CXR not significant Now over past week worse. 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 Does feel some better but  dry cough returned after finishing the cough protocol given at last ov. The prednisone really helped. Does not feel PND. Does have rare indigestion. No sinus symptoms or throat clearing. Cough can get quite severe .  Spirometry today in office is normal. She is a never smoker. 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  No weight loss, night sweats. No discolored mucus. Cough for >13 years.  She stopped her cyclical protocol and GERD tx after couple of weeks.     Past Medical History  Diagnosis Date  . Wolff-Parkinson-White (WPW) syndrome   . Hypertension   . Osteoarthritis   . Hyperlipidemia   . IBS (irritable bowel syndrome)   . Kidney stones      Family History  Problem Relation Age of Onset  . Leukemia Brother   . Heart disease Mother   . Heart disease Maternal Grandfather   . Hypertension Mother   . Hyperlipidemia Mother   . Cancer Paternal Grandfather   . Cancer Maternal Uncle     bone     History   Social History  . Marital Status: Married    Spouse Name: N/A    Number of Children: N/A  . Years of Education: N/A   Occupational History  . Geologist, engineering    Social History Main Topics  . Smoking status: Never Smoker   . Smokeless tobacco: Never Used  . Alcohol Use: No  . Drug Use: No  . Sexually Active: Not on file   Other Topics Concern  . Not on file   Social History Narrative  . No narrative on file      Allergies  Allergen Reactions  . Codeine     vomiting     No outpatient prescriptions prior to visit.      Review of Systems  Constitutional:   No  weight loss, night sweats,  Fevers, chills, fatigue, or  lassitude.  HEENT:   No headaches,  Difficulty swallowing,  Tooth/dental problems, or  Sore throat,                No sneezing, itching, ear ache, nasal congestion, post nasal drip,   CV:  No chest pain,  Orthopnea, PND, swelling in lower extremities, anasarca, dizziness, palpitations, syncope.   GI  No heartburn, indigestion, abdominal pain, nausea, vomiting, diarrhea, change in bowel habits, loss of appetite, bloody stools.   Resp: No shortness of breath with exertion or at rest.  No excess mucus, no productive cough,   No coughing up of blood.  No change in color of mucus.  No wheezing.  No chest wall deformity  Skin: no rash or lesions.  GU: no dysuria, change in color of urine, no urgency or frequency.  No flank pain, no hematuria   MS:  No joint pain or swelling.  No decreased range of motion.  No back pain.  Psych:  No change in mood or affect. No depression or anxiety.  No memory loss.          Objective:   Physical Exam                              Gen: Pleasant,obese , in no distress,  normal affect  ENT: No lesions,  mouth clear,  oropharynx clear, no postnasal drip  Neck: No JVD, no TMG, no carotid bruits  Lungs: No use of accessory muscles, no dullness to percussion, prominent pseudowheeze  Cardiovascular: RRR, heart sounds normal, no murmur or gallops, no peripheral edema  Abdomen: soft and NT, no HSM,  BS normal  Musculoskeletal: No deformities, no cyanosis or clubbing  Neuro: alert, non focal  Skin: Warm, no lesions or rashes   CXR : NAD        Assessment & Plan:

## 2011-06-04 NOTE — Assessment & Plan Note (Signed)
Cyclical cough. -w/up unrevealing. Suspect GERD/LPR /? PND component.  Spirometry is nml  CXR 02/2011 ok.  Never smoker.  RAST/Hypersensitivity profile neg.  If not improving may consider CT sinus next step.   Plan  Cont aggressive cough  Control with GERD and AR prevention regimen.  Advised may take >6 weeks to allow upper airway to heal.   Delsym 2 tsp Twice daily   Tessalon every 4 hr for cough   Add Tramadol 50mg  1-2 every 4 hr as needed.  Omeprazole 20mg  Twice daily  Before meal  Pepcid 20mg  At bedtime   Strict reflux diet Clortrimeton 4mg  2 At bedtime   Zyrtec 10mg  in am.  Sips of water, sugarless candy to avoid cough or throat clearing  NO MINTS>  follow up Dr. Delford Field  In 4 weeks.  Please contact office for sooner follow up if symptoms do not improve or worsen or seek emergency care

## 2011-06-04 NOTE — Patient Instructions (Addendum)
Delsym 2 tsp Twice daily   Tessalon every 4 hr for cough   Add Tramadol 50mg  1-2 every 4 hr as needed.  Omeprazole 20mg  Twice daily  Before meal  Pepcid 20mg  At bedtime   Strict reflux diet Clortrimeton 4mg  2 At bedtime   Zyrtec 10mg  in am.  Sips of water, sugarless candy to avoid cough or throat clearing  NO MINTS>  follow up Dr. Delford Field  In 4 weeks.  Please contact office for sooner follow up if symptoms do not improve or worsen or seek emergency care

## 2011-06-05 ENCOUNTER — Telehealth: Payer: Self-pay | Admitting: Critical Care Medicine

## 2011-06-05 NOTE — Telephone Encounter (Signed)
Pt states that she had ct done at [facility]. She is to come by on Monday and sign a release so that we can get her records. Will forward to JJ as a reminder of this.

## 2011-06-05 NOTE — Telephone Encounter (Signed)
Spoke with pt. She states that [facility] had wanted her to call back with information regarding ct chest done back in 2008. She states that she could not find the report, but did find letter that they mailed her and it states that there is a pulmonary nodule that is less than 4 mm in diameter and rec f/u ct chest in 6- 12 months. She states that she never did have a followup ct however. Please advise, thanks!

## 2011-06-05 NOTE — Telephone Encounter (Signed)
Please ask her where she had this done and get a copy please.

## 2011-06-08 NOTE — Telephone Encounter (Signed)
Pt returned to office and signed records release form.  Form faxed to [facility] @ [phone] for records to be sent to Dr Delford Field.  Release has been sent to be scanned into epic.

## 2011-06-16 ENCOUNTER — Telehealth: Payer: Self-pay | Admitting: Critical Care Medicine

## 2011-06-16 NOTE — Telephone Encounter (Signed)
Received copies from  [facility] ,on 06/16/11 . Forwarded  7 pages to Dr. Delford Field ,for review.

## 2011-07-02 ENCOUNTER — Ambulatory Visit: Payer: BC Managed Care – PPO | Admitting: Critical Care Medicine

## 2011-07-16 ENCOUNTER — Encounter: Payer: Self-pay | Admitting: Critical Care Medicine

## 2011-07-16 ENCOUNTER — Ambulatory Visit (HOSPITAL_BASED_OUTPATIENT_CLINIC_OR_DEPARTMENT_OTHER)
Admission: RE | Admit: 2011-07-16 | Discharge: 2011-07-16 | Disposition: A | Discharge status: Home / Self Care | Payer: BC Managed Care – PPO | Source: Ambulatory Visit | Attending: Critical Care Medicine | Admitting: Critical Care Medicine

## 2011-07-16 ENCOUNTER — Ambulatory Visit (INDEPENDENT_AMBULATORY_CARE_PROVIDER_SITE_OTHER): Payer: BC Managed Care – PPO | Admitting: Critical Care Medicine

## 2011-07-16 VITALS — BP 122/82 | HR 70 | Temp 98.0°F | Ht 65.0 in | Wt 246.0 lb

## 2011-07-16 DIAGNOSIS — R059 Cough, unspecified: Secondary | ICD-10-CM | POA: Insufficient documentation

## 2011-07-16 DIAGNOSIS — R05 Cough: Secondary | ICD-10-CM

## 2011-07-16 DIAGNOSIS — R911 Solitary pulmonary nodule: Secondary | ICD-10-CM | POA: Insufficient documentation

## 2011-07-16 DIAGNOSIS — J984 Other disorders of lung: Secondary | ICD-10-CM | POA: Insufficient documentation

## 2011-07-16 MED ORDER — FAMOTIDINE 20 MG PO TABS: ORAL_TABLET | ORAL | Status: DC

## 2011-07-16 MED ORDER — BENZONATATE 200 MG PO CAPS: ORAL_CAPSULE | ORAL | Status: DC

## 2011-07-16 MED ORDER — DEXTROMETHORPHAN POLISTIREX 30 MG/5ML PO LQCR: 60.0000 mg | Freq: Two times a day (BID) | ORAL | Status: DC | PRN

## 2011-07-16 NOTE — Assessment & Plan Note (Signed)
Right upper and right lower lobe lung nodules less than 5 mm in size 2008 Plan Repeat CT scan for further evaluation

## 2011-07-16 NOTE — Patient Instructions (Signed)
Change tessalon, delsym to as needed Stop pepcid once current bottle runs out Stay on omeprazole twice daily STRICT reflux diet CT chest today Stay on zyrtec as you are doing Return 4 months, I will call with CT Results

## 2011-07-16 NOTE — Progress Notes (Signed)
Subjective:    Patient ID: Connie Haynes, female    DOB: 07-28-1955, 56 y.o.   MRN: [id number]  HPI  56 y.o. WF with cyclical cough but no true asthma or rhinitis/sinusitis  04/21/11 Initial OV  Chronic cough since whooping cough episode in 2000.  Now since 11/12 cough worsening . ? Etiology. Worse 12/12 and Rx abx and not improved 1/13 CXR not significant Now over past week worse. 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 Does feel some better but  dry cough returned after finishing the cough protocol given at last ov. The prednisone really helped. Does not feel PND. Does have rare indigestion. No sinus symptoms or throat clearing. Cough can get quite severe .  Spirometry today in office is normal. She is a never smoker. 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  No weight loss, night sweats. No discolored mucus. Cough for >13 years.  She stopped her cyclical protocol and GERD tx after couple of weeks.   07/16/2011 Cough is now better.  Pt will still occ cough.  No real wheeze.  No real heartburn now. Pt denies any significant sore throat, nasal congestion or excess secretions, fever, chills, sweats, unintended weight loss, pleurtic or exertional chest pain, orthopnea PND, or leg swelling Pt denies any increase in rescue therapy over baseline, denies waking up needing it or having any early am or nocturnal exacerbations of coughing/wheezing/or dyspnea. Pt also denies any obvious fluctuation in symptoms with  weather or environmental change or other alleviating or aggravating factors  And additional issues the patient has a history of right lower lobe lung nodules these are less than 5 mm in diameter last image 2008. The patient desires additional imaging  Past Medical History  Diagnosis Date  . Wolff-Parkinson-White (WPW) syndrome   . Hypertension   . Osteoarthritis   .  Hyperlipidemia   . IBS (irritable bowel syndrome)   . Kidney stones      Family History  Problem Relation Age of Onset  . Leukemia Brother   . Heart disease Mother   . Heart disease Maternal Grandfather   . Hypertension Mother   . Hyperlipidemia Mother   . Cancer Paternal Grandfather   . Cancer Maternal Uncle     bone     History   Social History  . Marital Status: Married    Spouse Name: N/A    Number of Children: N/A  . Years of Education: N/A   Occupational History  . Geologist, engineering    Social History Main Topics  . Smoking status: Never Smoker   . Smokeless tobacco: Never Used  . Alcohol Use: No  . Drug Use: No  . Sexually Active: Not on file   Other Topics Concern  . Not on file   Social History Narrative  . No narrative on file     Allergies  Allergen Reactions  . Codeine     vomiting     Outpatient Prescriptions Prior to Visit  Medication Sig Dispense Refill  . Calcium-Vitamin D (CALTRATE 600 PLUS-VIT D PO) Take 1 tablet by mouth daily.      . DULoxetine (CYMBALTA) 30 MG capsule Take 30 mg by mouth daily.      . ergocalciferol (VITAMIN D2) 50000 UNITS capsule Take 50,000 Units by mouth 2 (two) times a week.      . losartan-hydrochlorothiazide (HYZAAR)  100-12.5 MG per tablet Take 1 tablet by mouth 2 (two) times daily.      . Multiple Vitamin (MULTIVITAMIN) tablet Take 1 tablet by mouth daily.      Marland Kitchen omeprazole (PRILOSEC OTC) 20 MG tablet Take 1 tablet (20 mg total) by mouth 2 (two) times daily.  60 tablet  6  . benzonatate (TESSALON) 200 MG capsule Take 1 every 4 hours per cough protocol  90 capsule  6  . celecoxib (CELEBREX) 200 MG capsule Take 200 mg by mouth daily.      . predniSONE (DELTASONE) 10 MG tablet Take 4 for two days three for two days two for two days one for two days  20 tablet  0     Review of Systems Constitutional:   No  weight loss, night sweats,  Fevers, chills, fatigue, lassitude. HEENT:   No headaches,  Difficulty  swallowing,  Tooth/dental problems,  Sore throat,                No sneezing, itching, ear ache, nasal congestion, post nasal drip,   CV:  No chest pain,  Orthopnea, PND, swelling in lower extremities, anasarca, dizziness, palpitations  GI  No heartburn, indigestion, abdominal pain, nausea, vomiting, diarrhea, change in bowel habits, loss of appetite  Resp: No shortness of breath with exertion or at rest.  No excess mucus, no productive cough,  No non-productive cough,  No coughing up of blood.  No change in color of mucus.  No wheezing.  No chest wall deformity  Skin: no rash or lesions.  GU: no dysuria, change in color of urine, no urgency or frequency.  No flank pain.  MS:  No joint pain or swelling.  No decreased range of motion.  No back pain.  Psych:  No change in mood or affect. No depression or anxiety.  No memory loss.     Objective:   Physical Exam  Filed Vitals:   07/16/11 0942  BP: 122/82  Pulse: 70  Temp: 98 F (36.7 C)  TempSrc: Oral  Height: 5\' 5"  (1.651 m)  Weight: 246 lb (111.585 kg)  SpO2: 98%    Gen: Pleasant, well-nourished, in no distress,  normal affect  ENT: No lesions,  mouth clear,  oropharynx clear, + postnasal drip, improvement in posterior pharyngeal erythema  Neck: No JVD, no TMG, no carotid bruits  Lungs: No use of accessory muscles, no dullness to percussion, clear without rales or rhonchi, resolution of pseudo-wheezing  Cardiovascular: RRR, heart sounds normal, no murmur or gallops, no peripheral edema  Abdomen: soft and NT, no HSM,  BS normal  Musculoskeletal: No deformities, no cyanosis or clubbing  Neuro: alert, non focal  Skin: Warm, no lesions or rashes  No results found.       Assessment & Plan:   Cough Cyclical cough without true asthma and no active sinus disease. Significant reflux component and postnasal drip syndrome all improved Plan Taper Delsym and Tessalon Perles to as needed Discontinue Pepcid Maintain  proton pump inhibitor twice daily Maintain antihistamine Strict reflux diet Return 4 months  Lung nodule Right upper and right lower lobe lung nodules less than 5 mm in size 2008 Plan Repeat CT scan for further evaluation     Updated Medication List Outpatient Encounter Prescriptions as of 07/16/2011  Medication Sig Dispense Refill  . benzonatate (TESSALON) 200 MG capsule Take 1 every 4 hours as needed  90 capsule  6  . Calcium-Vitamin D (CALTRATE 600 PLUS-VIT D PO)  Take 1 tablet by mouth daily.      . cetirizine (ZYRTEC ALLERGY) 10 MG tablet Take 10 mg by mouth every morning.      Marland Kitchen dextromethorphan (DELSYM) 30 MG/5ML liquid Take 10 mLs (60 mg total) by mouth 2 (two) times daily as needed for cough. 2 tsp twice daily  89 mL    . DULoxetine (CYMBALTA) 30 MG capsule Take 30 mg by mouth daily.      . ergocalciferol (VITAMIN D2) 50000 UNITS capsule Take 50,000 Units by mouth 2 (two) times a week.      . famotidine (PEPCID) 20 MG tablet When current bottle runs out , stop      . losartan-hydrochlorothiazide (HYZAAR) 100-12.5 MG per tablet Take 1 tablet by mouth 2 (two) times daily.      . Multiple Vitamin (MULTIVITAMIN) tablet Take 1 tablet by mouth daily.      Marland Kitchen omeprazole (PRILOSEC OTC) 20 MG tablet Take 1 tablet (20 mg total) by mouth 2 (two) times daily.  60 tablet  6  . traMADol (ULTRAM) 50 MG tablet Take 50 mg by mouth. 1 to 2 every 4 hours as needed      . DISCONTD: benzonatate (TESSALON) 200 MG capsule Take 1 every 4 hours per cough protocol  90 capsule  6  . DISCONTD: dextromethorphan (DELSYM) 30 MG/5ML liquid Take 60 mg by mouth. 2 tsp twice daily      . DISCONTD: famotidine (PEPCID) 20 MG tablet Take 20 mg by mouth at bedtime.      . celecoxib (CELEBREX) 200 MG capsule Take 200 mg by mouth daily.      Marland Kitchen DISCONTD: predniSONE (DELTASONE) 10 MG tablet Take 4 for two days three for two days two for two days one for two days  20 tablet  0

## 2011-07-16 NOTE — Assessment & Plan Note (Signed)
Cyclical cough without true asthma and no active sinus disease. Significant reflux component and postnasal drip syndrome all improved Plan Taper Delsym and Tessalon Perles to as needed Discontinue Pepcid Maintain proton pump inhibitor twice daily Maintain antihistamine Strict reflux diet Return 4 months

## 2011-09-23 DIAGNOSIS — H04209 Unspecified epiphora, unspecified lacrimal gland: Secondary | ICD-10-CM | POA: Insufficient documentation

## 2011-09-23 DIAGNOSIS — IMO0002 Reserved for concepts with insufficient information to code with codable children: Secondary | ICD-10-CM | POA: Insufficient documentation

## 2011-10-07 ENCOUNTER — Emergency Department (HOSPITAL_BASED_OUTPATIENT_CLINIC_OR_DEPARTMENT_OTHER): Payer: BC Managed Care – PPO

## 2011-10-07 ENCOUNTER — Encounter (HOSPITAL_BASED_OUTPATIENT_CLINIC_OR_DEPARTMENT_OTHER): Payer: Self-pay | Admitting: Family Medicine

## 2011-10-07 ENCOUNTER — Emergency Department (HOSPITAL_BASED_OUTPATIENT_CLINIC_OR_DEPARTMENT_OTHER)
Admission: EM | Admit: 2011-10-07 | Discharge: 2011-10-07 | Disposition: A | Discharge status: Home / Self Care | Payer: BC Managed Care – PPO | Attending: Emergency Medicine | Admitting: Emergency Medicine

## 2011-10-07 DIAGNOSIS — M25469 Effusion, unspecified knee: Secondary | ICD-10-CM | POA: Insufficient documentation

## 2011-10-07 DIAGNOSIS — E785 Hyperlipidemia, unspecified: Secondary | ICD-10-CM | POA: Insufficient documentation

## 2011-10-07 DIAGNOSIS — I10 Essential (primary) hypertension: Secondary | ICD-10-CM | POA: Insufficient documentation

## 2011-10-07 DIAGNOSIS — Z8249 Family history of ischemic heart disease and other diseases of the circulatory system: Secondary | ICD-10-CM | POA: Insufficient documentation

## 2011-10-07 DIAGNOSIS — M25461 Effusion, right knee: Secondary | ICD-10-CM

## 2011-10-07 DIAGNOSIS — Z806 Family history of leukemia: Secondary | ICD-10-CM | POA: Insufficient documentation

## 2011-10-07 DIAGNOSIS — Z8489 Family history of other specified conditions: Secondary | ICD-10-CM | POA: Insufficient documentation

## 2011-10-07 DIAGNOSIS — Z809 Family history of malignant neoplasm, unspecified: Secondary | ICD-10-CM | POA: Insufficient documentation

## 2011-10-07 DIAGNOSIS — Z808 Family history of malignant neoplasm of other organs or systems: Secondary | ICD-10-CM | POA: Insufficient documentation

## 2011-10-07 DIAGNOSIS — Z885 Allergy status to narcotic agent status: Secondary | ICD-10-CM | POA: Insufficient documentation

## 2011-10-07 MED ORDER — OXYCODONE-ACETAMINOPHEN 5-325 MG PO TABS: 2.0000 | ORAL_TABLET | ORAL | Status: AC | PRN

## 2011-10-07 NOTE — ED Provider Notes (Signed)
History     CSN: [id number]  Arrival date & time 10/07/11  1191   First MD Initiated Contact with Patient 10/07/11 1852      Chief Complaint  Patient presents with  . Knee Pain    (Consider location/radiation/quality/duration/timing/severity/associated sxs/prior treatment) Patient is a 56 y.o. female presenting with knee pain. The history is provided by the patient. No language interpreter was used.  Knee Pain This is a new problem. The current episode started today. The problem occurs constantly. The problem has been gradually worsening. Nothing aggravates the symptoms. She has tried nothing for the symptoms. The treatment provided moderate relief.  Pt complains of pain in her right knee.  Pt reports she had sudden onset of pain today, now swelling  Past Medical History  Diagnosis Date  . Wolff-Parkinson-White (WPW) syndrome   . Hypertension   . Osteoarthritis   . Hyperlipidemia   . IBS (irritable bowel syndrome)   . Kidney stones     Past Surgical History  Procedure Date  . Tear duct surgery   . Cervical spine surgery   . Total abdominal hysterectomy   . Total hip arthroplasty     x 3  . Colon surgery   . Ablation surgery   . Eyelid carcinoma excision     Family History  Problem Relation Age of Onset  . Leukemia Brother   . Heart disease Mother   . Heart disease Maternal Grandfather   . Hypertension Mother   . Hyperlipidemia Mother   . Cancer Paternal Grandfather   . Cancer Maternal Uncle     bone    History  Substance Use Topics  . Smoking status: Never Smoker   . Smokeless tobacco: Never Used  . Alcohol Use: No    OB History    Grav Para Term Preterm Abortions TAB SAB Ect Mult Living                  Review of Systems  All other systems reviewed and are negative.    Allergies  Codeine  Home Medications   Current Outpatient Rx  Name Route Sig Dispense Refill  . CALTRATE 600 PLUS-VIT D PO Oral Take 1 tablet by mouth daily.    .  CELECOXIB 200 MG PO CAPS Oral Take 200 mg by mouth daily.    . DULOXETINE HCL 30 MG PO CPEP Oral Take 30 mg by mouth daily.    Marland Kitchen LOSARTAN POTASSIUM-HCTZ 100-12.5 MG PO TABS Oral Take 1 tablet by mouth 2 (two) times daily.    . LUBIPROSTONE 24 MCG PO CAPS Oral Take 24 mcg by mouth 2 (two) times daily with a meal.    . ONE-DAILY MULTI VITAMINS PO TABS Oral Take 1 tablet by mouth daily.    . ERGOCALCIFEROL 50000 UNITS PO CAPS Oral Take 50,000 Units by mouth 2 (two) times a week.      BP 151/86  Pulse 98  Temp 99 F (37.2 C) (Oral)  Resp 16  SpO2 97%  Physical Exam  Nursing note and vitals reviewed. Constitutional: She is oriented to person, place, and time. She appears well-developed and well-nourished.  HENT:  Head: Normocephalic and atraumatic.  Nose: Nose normal.  Eyes: EOM are normal.  Musculoskeletal: She exhibits tenderness.       Effusion right knee, decreased range of motion,  nv and ns intact  Neurological: She is alert and oriented to person, place, and time. She has normal reflexes.  Skin: Skin is warm.  Psychiatric: She has a normal mood and affect.    ED Course  Procedures (including critical care time)  Labs Reviewed - No data to display Dg Knee Complete 4 Views Right  10/07/2011  *RADIOLOGY REPORT*  Clinical Data: Knee gave way.  RIGHT KNEE - COMPLETE 4+ VIEW  Comparison: None.  Findings: There is no acute bony or joint abnormality.  No joint effusion is identified.  Tricompartmental degenerative change appears most advanced in the medial compartment.  IMPRESSION: No acute finding.  Tricompartmental osteoarthritis.   Original Report Authenticated By: Bernadene Bell. D'ALESSIO, M.D.      1. Knee effusion, right       MDM  Knee imbolizer, crutches,   Pt has seen Dr. Charlann Boxer in the past.  I advised her to see him for evaluation        Elson Areas, PA 10/07/11 1942  Lonia Skinner [place], [place] 10/07/11 1946

## 2011-10-07 NOTE — ED Notes (Signed)
Pt c/o right knee pain since lunch today. Pt denies injury. Pt walking with a limp.

## 2011-10-08 NOTE — ED Provider Notes (Signed)
Medical screening examination/treatment/procedure(s) were performed by non-physician practitioner and as supervising physician I was immediately available for consultation/collaboration.  Cyndra Numbers, MD 10/08/11 [phone]

## 2011-12-17 ENCOUNTER — Encounter: Payer: Self-pay | Admitting: Critical Care Medicine

## 2011-12-17 ENCOUNTER — Ambulatory Visit (INDEPENDENT_AMBULATORY_CARE_PROVIDER_SITE_OTHER): Payer: BC Managed Care – PPO | Admitting: Critical Care Medicine

## 2011-12-17 ENCOUNTER — Telehealth: Payer: Self-pay | Admitting: *Deleted

## 2011-12-17 VITALS — BP 112/74 | HR 84 | Temp 98.3°F | Ht 65.0 in | Wt 244.0 lb

## 2011-12-17 DIAGNOSIS — R05 Cough: Secondary | ICD-10-CM

## 2011-12-17 DIAGNOSIS — R059 Cough, unspecified: Secondary | ICD-10-CM

## 2011-12-17 MED ORDER — PREDNISONE 10 MG PO TABS: ORAL_TABLET | ORAL | Status: DC

## 2011-12-17 MED ORDER — CHLORPHENIRAMINE TANNATE 12 MG PO TABS: 12.0000 mg | ORAL_TABLET | Freq: Every day | ORAL | Status: DC

## 2011-12-17 MED ORDER — DEXTROMETHORPHAN POLISTIREX 30 MG/5ML PO LQCR: 60.0000 mg | ORAL | Status: DC | PRN

## 2011-12-17 MED ORDER — FLUTICASONE PROPIONATE 50 MCG/ACT NA SUSP: 2.0000 | Freq: Every day | NASAL | Status: DC

## 2011-12-17 MED ORDER — FAMOTIDINE 20 MG PO TABS: 40.0000 mg | ORAL_TABLET | Freq: Every day | ORAL | Status: DC

## 2011-12-17 NOTE — Telephone Encounter (Signed)
Message copied by Valentino Hue on Thu Dec 17, 2011  4:28 PM ------      Message from: Gweneth Dimitri D      Created: Thu Dec 17, 2011  4:10 PM       Call pt regarding tramadol -- she forgot to mention that she is taking this qhs.  Is this still ok?

## 2011-12-17 NOTE — Telephone Encounter (Signed)
Per Dr. Delford Field, pt shouldn't take tramadol for now with the delsym cough protocol.   Called, spoke with pt.  Informed her of this.  She verbalized understanding.

## 2011-12-17 NOTE — Assessment & Plan Note (Signed)
Recurrent cyclical cough d/t postnasal drip and GERD Plan Pulse prednisone Chlorpheniramine 12mg  QHS Delsym/Tessalon for cough protocol Fluticasone nasal spray pepcid 40mg  nightly x 2 weeks Strict reflux diet Stay on PPI Rov 1 month

## 2011-12-17 NOTE — Progress Notes (Signed)
Subjective:    Patient ID: Connie Haynes, female    DOB: February 07, 1956, 55 y.o.   MRN: [id number]  HPI   56 y.o. WF with cyclical cough but no true asthma or rhinitis/sinusitis  04/21/11 Initial OV  Chronic cough since whooping cough episode in 2000.  Now since 11/12 cough worsening . ? Etiology. Worse 12/12 and Rx abx and not improved 1/13 CXR not significant Now over past week worse. 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 Does feel some better but  dry cough returned after finishing the cough protocol given at last ov. The prednisone really helped. Does not feel PND. Does have rare indigestion. No sinus symptoms or throat clearing. Cough can get quite severe .  Spirometry today in office is normal. She is a never smoker. 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  No weight loss, night sweats. No discolored mucus. Cough for >13 years.  She stopped her cyclical protocol and GERD tx after couple of weeks.   07/16/2011 Cough is now better.  Pt will still occ cough.  No real wheeze.  No real heartburn now. Pt denies any significant sore throat, nasal congestion or excess secretions, fever, chills, sweats, unintended weight loss, pleurtic or exertional chest pain, orthopnea PND, or leg swelling Pt denies any increase in rescue therapy over baseline, denies waking up needing it or having any early am or nocturnal exacerbations of coughing/wheezing/or dyspnea. Pt also denies any obvious fluctuation in symptoms with  weather or environmental change or other alleviating or aggravating factors  And additional issues the patient has a history of right lower lobe lung nodules these are less than 5 mm in diameter last image 2008. The patient desires additional imaging  12/17/2011 Hx of cyclical cough without true asthma/pulm disease At last ov 07/2011  we rec: Taper Delsym and Tessalon Perles to as  needed Discontinue Pepcid Maintain proton pump inhibitor twice daily Maintain antihistamine Strict reflux diet Now:  Cough returned one month ago.  No heartburn.  On PPI BID.  Pt had stopped the cough med>>then back on the protocol but did not help as much. On no antihistamine use. No pndrip. Notes some sneezing.  No sore throat.  No real chest pain.  Pt does sigh breaths.   Past Medical History  Diagnosis Date  . Wolff-Parkinson-White (WPW) syndrome   . Hypertension   . Osteoarthritis   . Hyperlipidemia   . IBS (irritable bowel syndrome)   . Kidney stones      Family History  Problem Relation Age of Onset  . Leukemia Brother   . Heart disease Mother   . Heart disease Maternal Grandfather   . Hypertension Mother   . Hyperlipidemia Mother   . Cancer Paternal Grandfather   . Cancer Maternal Uncle     bone     History   Social History  . Marital Status: Married    Spouse Name: N/A    Number of Children: N/A  . Years of Education: N/A   Occupational History  . Geologist, engineering    Social History Main Topics  . Smoking status: Never Smoker   . Smokeless tobacco: Never Used  . Alcohol Use: No  . Drug Use: No  . Sexually Active: Not on file   Other Topics Concern  . Not on file   Social History Narrative  . No narrative  on file     Allergies  Allergen Reactions  . Codeine     vomiting     Outpatient Prescriptions Prior to Visit  Medication Sig Dispense Refill  . Calcium-Vitamin D (CALTRATE 600 PLUS-VIT D PO) Take 1 tablet by mouth daily.      . DULoxetine (CYMBALTA) 30 MG capsule Take 30 mg by mouth daily.      Marland Kitchen losartan-hydrochlorothiazide (HYZAAR) 100-12.5 MG per tablet Take 1 tablet by mouth 2 (two) times daily.      Marland Kitchen lubiprostone (AMITIZA) 24 MCG capsule Take 24 mcg by mouth 2 (two) times daily with a meal.      . Multiple Vitamin (MULTIVITAMIN) tablet Take 1 tablet by mouth daily.      . celecoxib (CELEBREX) 200 MG capsule Take 200 mg by mouth  daily.      . ergocalciferol (VITAMIN D2) 50000 UNITS capsule Take 50,000 Units by mouth 2 (two) times a week.         Review of Systems  Constitutional:   No  weight loss, night sweats,  Fevers, chills, fatigue, lassitude. HEENT:   No headaches,  Difficulty swallowing,  Tooth/dental problems,  Sore throat,                No sneezing, itching, ear ache, nasal congestion, post nasal drip,   CV:  No chest pain,  Orthopnea, PND, swelling in lower extremities, anasarca, dizziness, palpitations  GI  No heartburn, indigestion, abdominal pain, nausea, vomiting, diarrhea, change in bowel habits, loss of appetite  Resp: No shortness of breath with exertion or at rest.  No excess mucus, no productive cough,  No non-productive cough,  No coughing up of blood.  No change in color of mucus.  No wheezing.  No chest wall deformity  Skin: no rash or lesions.  GU: no dysuria, change in color of urine, no urgency or frequency.  No flank pain.  MS:  No joint pain or swelling.  No decreased range of motion.  No back pain.  Psych:  No change in mood or affect. No depression or anxiety.  No memory loss.     Objective:   Physical Exam   Filed Vitals:   12/17/11 1530  BP: 112/74  Pulse: 84  Temp: 98.3 F (36.8 C)  TempSrc: Oral  Height: 5\' 5"  (1.651 m)  Weight: 244 lb (110.678 kg)  SpO2: 93%    Gen: Pleasant, well-nourished, in no distress,  normal affect  ENT: No lesions,  mouth clear,  oropharynx clear, + postnasal drip,mild  posterior pharyngeal erythema  Neck: No JVD, no TMG, no carotid bruits  Lungs: No use of accessory muscles, no dullness to percussion, clear without rales or rhonchi, mild  pseudo-wheezing  Cardiovascular: RRR, heart sounds normal, no murmur or gallops, no peripheral edema  Abdomen: soft and NT, no HSM,  BS normal  Musculoskeletal: No deformities, no cyanosis or clubbing  Neuro: alert, non focal  Skin: Warm, no lesions or rashes  No results found.         Assessment & Plan:   Cough Recurrent cyclical cough d/t postnasal drip and GERD Plan Pulse prednisone Chlorpheniramine 12mg  QHS Delsym/Tessalon for cough protocol Fluticasone nasal spray pepcid 40mg  nightly x 2 weeks Strict reflux diet Stay on PPI Rov 1 month    Updated Medication List Outpatient Encounter Prescriptions as of 12/17/2011  Medication Sig Dispense Refill  . ARTHROTEC 75-0.2 MG TBEC Take 1 tablet by mouth Twice daily.      Marland Kitchen  benzonatate (TESSALON) 200 MG capsule Take 2 capsules by mouth Every 4 hours as needed.      . Calcium-Vitamin D (CALTRATE 600 PLUS-VIT D PO) Take 1 tablet by mouth daily.      . Cholecalciferol (VITAMIN D) 1000 UNITS capsule Take 2,000 Units by mouth daily.      . DULoxetine (CYMBALTA) 30 MG capsule Take 30 mg by mouth daily.      Marland Kitchen losartan-hydrochlorothiazide (HYZAAR) 100-12.5 MG per tablet Take 1 tablet by mouth 2 (two) times daily.      Marland Kitchen lubiprostone (AMITIZA) 24 MCG capsule Take 24 mcg by mouth 2 (two) times daily with a meal.      . Multiple Vitamin (MULTIVITAMIN) tablet Take 1 tablet by mouth daily.      Marland Kitchen omeprazole (PRILOSEC OTC) 20 MG tablet Take 20 mg by mouth 2 (two) times daily.      Marland Kitchen rOPINIRole (REQUIP) 1 MG tablet Take 1 tablet by mouth At bedtime.      . Chlorpheniramine Tannate 12 MG TABS Take 1 tablet (12 mg total) by mouth at bedtime.  30 each  4  . dextromethorphan (DELSYM) 30 MG/5ML liquid Take 10 mLs (60 mg total) by mouth as needed for cough.  89 mL  0  . famotidine (PEPCID) 20 MG tablet Take 2 tablets (40 mg total) by mouth at bedtime.  60 tablet  1  . fluticasone (FLONASE) 50 MCG/ACT nasal spray Place 2 sprays into the nose daily.  16 g  2  . predniSONE (DELTASONE) 10 MG tablet Take 4 for two days three for two days two for two days one for two days  20 tablet  0  . [DISCONTINUED] celecoxib (CELEBREX) 200 MG capsule Take 200 mg by mouth daily.      . [DISCONTINUED] ergocalciferol (VITAMIN D2) 50000 UNITS capsule Take  50,000 Units by mouth 2 (two) times a week.

## 2011-12-17 NOTE — Patient Instructions (Addendum)
Prednisone 10mg  Take 4 for two days three for two days two for two days one for two days Chlorpheniramine 12mg  at bedtime Pepcid 40mg  at bedtime Stay on prilosec  Fluticasone nasal spray two puff each nostril daily Use Delsym and tessalon per cough protocol Return 1 month

## 2012-01-21 ENCOUNTER — Ambulatory Visit: Payer: BC Managed Care – PPO | Admitting: Critical Care Medicine

## 2012-03-03 ENCOUNTER — Ambulatory Visit: Payer: BC Managed Care – PPO | Admitting: Critical Care Medicine

## 2012-04-04 DIAGNOSIS — J329 Chronic sinusitis, unspecified: Secondary | ICD-10-CM | POA: Insufficient documentation

## 2012-04-04 DIAGNOSIS — R519 Headache, unspecified: Secondary | ICD-10-CM | POA: Insufficient documentation

## 2012-04-04 DIAGNOSIS — R51 Headache: Secondary | ICD-10-CM | POA: Insufficient documentation

## 2012-05-16 ENCOUNTER — Ambulatory Visit (INDEPENDENT_AMBULATORY_CARE_PROVIDER_SITE_OTHER): Payer: BC Managed Care – PPO | Admitting: Family Medicine

## 2012-05-16 ENCOUNTER — Encounter: Payer: Self-pay | Admitting: Family Medicine

## 2012-05-16 ENCOUNTER — Ambulatory Visit (HOSPITAL_BASED_OUTPATIENT_CLINIC_OR_DEPARTMENT_OTHER)
Admission: RE | Admit: 2012-05-16 | Discharge: 2012-05-16 | Disposition: A | Discharge status: Home / Self Care | Payer: BC Managed Care – PPO | Source: Ambulatory Visit | Attending: Family Medicine | Admitting: Family Medicine

## 2012-05-16 VITALS — BP 151/78 | HR 83

## 2012-05-16 DIAGNOSIS — M25561 Pain in right knee: Secondary | ICD-10-CM

## 2012-05-16 DIAGNOSIS — M25569 Pain in unspecified knee: Secondary | ICD-10-CM | POA: Insufficient documentation

## 2012-05-16 MED ORDER — DICLOFENAC SODIUM 1 % TD GEL: 4.0000 g | Freq: Four times a day (QID) | TRANSDERMAL | Status: DC

## 2012-05-16 MED ORDER — CLONAZEPAM 1 MG PO TABS: 1.0000 mg | ORAL_TABLET | Freq: Every evening | ORAL | Status: DC | PRN

## 2012-05-16 NOTE — Progress Notes (Signed)
Subjective:     Patient ID: Connie Haynes, female   DOB: [birth year], 57 y.o.   MRN: [id number]  HPI Connie Haynes is here today complaining of pain and swelling in her right knee.  She fell two days ago and would like to have it x-rayed to be sure that she has not broken anything. She had surgery on this knee 3 months ago.      Review of Systems  Musculoskeletal: Positive for joint swelling.  Neurological:       Sleeping Trouble          Objective:   Physical Exam  Musculoskeletal: She exhibits tenderness. Edema: Right knee.       Assessment:     Right Knee Pain  Insomnia     Plan:     Sent for an x-ray Prescriptions given for Voltaren Gel for his knee pain and Klonopin for her insomnia.

## 2012-05-17 ENCOUNTER — Telehealth: Payer: Self-pay | Admitting: Family Medicine

## 2012-05-17 NOTE — Telephone Encounter (Signed)
Connie Haynes tripped and fell a few days ago and injured her left knee.  She was sent for an x-ray which was normal.

## 2012-05-19 ENCOUNTER — Other Ambulatory Visit: Payer: Self-pay | Admitting: Family Medicine

## 2012-05-19 DIAGNOSIS — Z5181 Encounter for therapeutic drug level monitoring: Secondary | ICD-10-CM

## 2012-05-19 DIAGNOSIS — E785 Hyperlipidemia, unspecified: Secondary | ICD-10-CM

## 2012-05-19 DIAGNOSIS — R5383 Other fatigue: Secondary | ICD-10-CM

## 2012-05-19 DIAGNOSIS — R7301 Impaired fasting glucose: Secondary | ICD-10-CM

## 2012-05-19 DIAGNOSIS — R5381 Other malaise: Secondary | ICD-10-CM

## 2012-05-19 DIAGNOSIS — I10 Essential (primary) hypertension: Secondary | ICD-10-CM

## 2012-05-23 ENCOUNTER — Other Ambulatory Visit: Payer: BC Managed Care – PPO | Admitting: *Deleted

## 2012-05-23 DIAGNOSIS — R7301 Impaired fasting glucose: Secondary | ICD-10-CM

## 2012-05-23 DIAGNOSIS — Z5181 Encounter for therapeutic drug level monitoring: Secondary | ICD-10-CM

## 2012-05-23 DIAGNOSIS — R5381 Other malaise: Secondary | ICD-10-CM

## 2012-05-23 DIAGNOSIS — E785 Hyperlipidemia, unspecified: Secondary | ICD-10-CM

## 2012-05-23 DIAGNOSIS — I10 Essential (primary) hypertension: Secondary | ICD-10-CM

## 2012-05-23 DIAGNOSIS — R5383 Other fatigue: Secondary | ICD-10-CM

## 2012-05-23 LAB — COMPREHENSIVE METABOLIC PANEL
ALT: 16 U/L (ref 0–35)
AST: 13 U/L (ref 0–37)
Albumin: 4.7 g/dL (ref 3.5–5.2)
Alkaline Phosphatase: 75 U/L (ref 39–117)
BUN: 21 mg/dL (ref 6–23)
CO2: 26 mEq/L (ref 19–32)
Calcium: 9.7 mg/dL (ref 8.4–10.5)
Chloride: 100 mEq/L (ref 96–112)
Creat: 0.82 mg/dL (ref 0.50–1.10)
Glucose, Bld: 83 mg/dL (ref 70–99)
Potassium: 3.5 mEq/L (ref 3.5–5.3)
Sodium: 137 mEq/L (ref 135–145)
Total Bilirubin: 1 mg/dL (ref 0.3–1.2)
Total Protein: 7.4 g/dL (ref 6.0–8.3)

## 2012-05-23 LAB — CBC WITH DIFFERENTIAL/PLATELET
Basophils Absolute: 0.1 10*3/uL (ref 0.0–0.1)
Basophils Relative: 1 % (ref 0–1)
Eosinophils Absolute: 0.1 10*3/uL (ref 0.0–0.7)
Eosinophils Relative: 1 % (ref 0–5)
HCT: 40.8 % (ref 36.0–46.0)
Hemoglobin: 13.8 g/dL (ref 12.0–15.0)
Lymphocytes Relative: 23 % (ref 12–46)
Lymphs Abs: 2.1 10*3/uL (ref 0.7–4.0)
MCH: 30 pg (ref 26.0–34.0)
MCHC: 33.8 g/dL (ref 30.0–36.0)
MCV: 88.7 fL (ref 78.0–100.0)
Monocytes Absolute: 0.5 10*3/uL (ref 0.1–1.0)
Monocytes Relative: 5 % (ref 3–12)
Neutro Abs: 6.5 10*3/uL (ref 1.7–7.7)
Neutrophils Relative %: 70 % (ref 43–77)
Platelets: 292 10*3/uL (ref 150–400)
RBC: 4.6 MIL/uL (ref 3.87–5.11)
RDW: 13.2 % (ref 11.5–15.5)
WBC: 9.3 10*3/uL (ref 4.0–10.5)

## 2012-05-23 LAB — LIPID PANEL
Cholesterol: 217 mg/dL — ABNORMAL HIGH (ref 0–200)
HDL: 66 mg/dL (ref >39)
LDL Cholesterol: 136 mg/dL — ABNORMAL HIGH (ref 0–99)
Total CHOL/HDL Ratio: 3.3 Ratio
Triglycerides: 77 mg/dL (ref <150)
VLDL: 15 mg/dL (ref 0–40)

## 2012-05-23 LAB — TSH: TSH: 1.489 u[IU]/mL (ref 0.350–4.500)

## 2012-05-25 ENCOUNTER — Encounter: Payer: Self-pay | Admitting: *Deleted

## 2012-05-31 ENCOUNTER — Ambulatory Visit: Payer: BC Managed Care – PPO | Admitting: Family Medicine

## 2012-06-02 ENCOUNTER — Encounter: Payer: Self-pay | Admitting: Family Medicine

## 2012-06-02 ENCOUNTER — Ambulatory Visit (INDEPENDENT_AMBULATORY_CARE_PROVIDER_SITE_OTHER): Payer: BC Managed Care – PPO | Admitting: Family Medicine

## 2012-06-02 VITALS — BP 144/87 | HR 72

## 2012-06-02 DIAGNOSIS — Z01818 Encounter for other preprocedural examination: Secondary | ICD-10-CM

## 2012-06-02 DIAGNOSIS — G2581 Restless legs syndrome: Secondary | ICD-10-CM

## 2012-06-02 NOTE — Progress Notes (Signed)
Subjective:     Patient ID: Connie Haynes, female   DOB: 07-Jan-1956, 57 y.o.   MRN: [id number]  HPI  Connie Haynes is here today to discuss her lab results and to get surgical clearance.  She is scheduled for right shoulder joint replacement surgery on 07/15/12.  She is under the care of Dr. Ranell Patrick with [facility].  She needs a surgical clearance form completed.  Review of Systems  Constitutional: Negative for activity change, fatigue and unexpected weight change.  HENT: Negative.   Eyes: Negative.   Respiratory: Negative for shortness of breath.   Cardiovascular: Negative for chest pain, palpitations and leg swelling.  Gastrointestinal: Negative for diarrhea and constipation.  Endocrine: Negative.   Genitourinary: Negative for difficulty urinating.  Musculoskeletal: Positive for arthralgias (Right Shoulder Pain ).  Skin: Negative.   Neurological: Negative.   Hematological: Negative for adenopathy. Does not bruise/bleed easily.  Psychiatric/Behavioral: Negative for sleep disturbance and dysphoric mood. The patient is not nervous/anxious.       Objective:   Physical Exam  Constitutional: She appears well-nourished. No distress.  HENT:  Head: Normocephalic.  Eyes: No scleral icterus.  Neck: No thyromegaly present.  Cardiovascular: Normal rate, regular rhythm and normal heart sounds.   Pulmonary/Chest: Effort normal and breath sounds normal.  Abdominal: There is no tenderness.  Musculoskeletal: She exhibits no edema and no tenderness.  Neurological: She is alert.  Skin: Skin is warm and dry.  Psychiatric: She has a normal mood and affect. Her behavior is normal. Judgment and thought content normal.       Assessment:     Right Shoulder Pain Pre-Operative Exam Hyperlipidemia     Plan:     1)  She is medically stable for surgery.  Her EKG did not show any significant changes.    2)  She is to work harder on her diet and exercise once she has recovered from her surgery.      TIME 30 MINUTES:  MORE THAN 50 % OF TIME WAS INVOLVED IN COUNSELING.

## 2012-06-12 DIAGNOSIS — F419 Anxiety disorder, unspecified: Secondary | ICD-10-CM | POA: Insufficient documentation

## 2012-06-12 DIAGNOSIS — G2581 Restless legs syndrome: Secondary | ICD-10-CM | POA: Insufficient documentation

## 2012-06-12 DIAGNOSIS — Z01818 Encounter for other preprocedural examination: Secondary | ICD-10-CM | POA: Insufficient documentation

## 2012-06-12 DIAGNOSIS — F411 Generalized anxiety disorder: Secondary | ICD-10-CM | POA: Insufficient documentation

## 2012-06-30 NOTE — H&P (Signed)
Connie Haynes is an 57 y.o. female.    Chief Complaint: left shoulder pain  HPI: Pt is a 57 y.o. female complaining of left shoulder pain for multiple years. Pain had continually increased since the beginning. X-rays in the clinic show end-stage arthritic changes of the left shoulder. Pt has tried various conservative treatments which have failed to alleviate their symptoms, including therapy and shots. Various options are discussed with the patient. Risks, benefits and expectations were discussed with the patient. Patient understand the risks, benefits and expectations and wishes to proceed with surgery.   PCP:  Birdena Jubilee, MD  D/C Plans:  Home with HHPT  PMH: Past Medical History  Diagnosis Date  . Wolff-Parkinson-White (WPW) syndrome   . Hypertension   . Osteoarthritis   . Hyperlipidemia   . IBS (irritable bowel syndrome)   . Kidney stones     PSH: Past Surgical History  Procedure Laterality Date  . Tear duct surgery    . Cervical spine surgery    . Total abdominal hysterectomy    . Total hip arthroplasty      x 3  . Colon surgery    . Ablation surgery    . Eyelid carcinoma excision      Social History:  reports that she has never smoked. She has never used smokeless tobacco. She reports that she does not drink alcohol or use illicit drugs.  Allergies:  Allergies  Allergen Reactions  . Codeine     vomiting    Medications: No current facility-administered medications for this encounter.   Current Outpatient Prescriptions  Medication Sig Dispense Refill  . Calcium-Vitamin D (CALTRATE 600 PLUS-VIT D PO) Take 1 tablet by mouth daily.      . Cholecalciferol (VITAMIN D) 1000 UNITS capsule Take 2,000 Units by mouth daily.      . clonazePAM (KLONOPIN) 1 MG tablet Take 1 tablet (1 mg total) by mouth at bedtime as needed for anxiety. Take 1 tab po q hs for sleep  30 tablet  1  . DULoxetine (CYMBALTA) 30 MG capsule Take 30 mg by mouth daily.      Marland Kitchen  losartan-hydrochlorothiazide (HYZAAR) 100-12.5 MG per tablet Take 1 tablet by mouth 2 (two) times daily.      Marland Kitchen lubiprostone (AMITIZA) 24 MCG capsule Take 24 mcg by mouth 2 (two) times daily with a meal.      . meloxicam (MOBIC) 7.5 MG tablet       . Multiple Vitamin (MULTIVITAMIN) tablet Take 1 tablet by mouth daily.      Marland Kitchen rOPINIRole (REQUIP) 1 MG tablet Take 1 tablet by mouth At bedtime.        No results found for this or any previous visit (from the past 48 hour(s)). No results found.  ROS: Pain with rom of the left upper extremity  Physical Exam: BP:   138/94  ;  HR:   80  ; Resp:   14  : Alert and oriented 57 y.o. female in no acute distress Cranial nerves 2-12 intact Cervical spine: full rom with no tenderness, nv intact distally Chest: active breath sounds bilaterally, no wheeze rhonchi or rales Heart: regular rate and rhythm, no murmur Abd: non tender non distended with active bowel sounds Hip is stable with rom  Left shoulder with moderate decrease in rom Strength 4/5 with ER and IR as compared the left nv intact distally  Assessment/Plan Assessment: left shoulder end stage osteoarthritis  Plan: Patient will undergo a left  total shoulder arthroplasty by Dr. Ranell Patrick at [facility]. Risks benefits and expectations were discussed with the patient. Patient understand risks, benefits and expectations and wishes to proceed.

## 2012-07-01 ENCOUNTER — Ambulatory Visit (INDEPENDENT_AMBULATORY_CARE_PROVIDER_SITE_OTHER): Payer: BC Managed Care – PPO | Admitting: Family Medicine

## 2012-07-01 ENCOUNTER — Encounter: Payer: Self-pay | Admitting: Family Medicine

## 2012-07-01 VITALS — BP 131/85 | HR 74 | Wt 235.0 lb

## 2012-07-01 DIAGNOSIS — R059 Cough, unspecified: Secondary | ICD-10-CM

## 2012-07-01 DIAGNOSIS — R05 Cough: Secondary | ICD-10-CM

## 2012-07-01 MED ORDER — ROPINIROLE HCL 1 MG PO TABS: 1.0000 mg | ORAL_TABLET | Freq: Every day | ORAL | Status: DC

## 2012-07-01 MED ORDER — ALBUTEROL SULFATE HFA 108 (90 BASE) MCG/ACT IN AERS: 2.0000 | INHALATION_SPRAY | Freq: Four times a day (QID) | RESPIRATORY_TRACT | Status: DC | PRN

## 2012-07-01 MED ORDER — NEBIVOLOL HCL 10 MG PO TABS: 10.0000 mg | ORAL_TABLET | Freq: Every day | ORAL | Status: DC

## 2012-07-01 MED ORDER — BENZONATATE 200 MG PO CAPS: 200.0000 mg | ORAL_CAPSULE | Freq: Three times a day (TID) | ORAL | Status: DC | PRN

## 2012-07-01 MED ORDER — FLUTICASONE FUROATE-VILANTEROL 100-25 MCG/INH IN AEPB: 1.0000 | INHALATION_SPRAY | Freq: Every day | RESPIRATORY_TRACT | Status: DC

## 2012-07-01 NOTE — Progress Notes (Signed)
  Subjective:    Patient ID: Connie Haynes, female    DOB: 03-02-1955, 57 y.o.   MRN: [id number]  HPI  Connie Haynes comes in today complaining of chronic cough that she has had for several years.  She has been evaluated by Dr. Delford Field several times for this problem.  She has tried several treatments which have not helped her cough.    Review of Systems  Constitutional: Negative for fever and unexpected weight change.  Respiratory: Positive for cough. Negative for chest tightness, shortness of breath and wheezing.   Cardiovascular: Negative for chest pain, palpitations and leg swelling.   Past Medical History  Diagnosis Date  . Wolff-Parkinson-White (WPW) syndrome   . Hypertension   . Osteoarthritis   . Hyperlipidemia   . IBS (irritable bowel syndrome)   . Kidney stones   . PONV (postoperative nausea and vomiting)     " woke up to soon"  . Cough    Family History  Problem Relation Age of Onset  . Leukemia Brother   . Heart disease Mother   . Heart disease Maternal Grandfather   . Hypertension Mother   . Hyperlipidemia Mother   . Cancer Paternal Grandfather   . 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 No distress.  HENT:  Head: Normocephalic.  Mouth/Throat: No oropharyngeal exudate.  Eyes: Conjunctivae are normal. Right eye exhibits no discharge. Left eye exhibits no discharge.  Neck: Neck supple.  Cardiovascular: Normal rate, regular rhythm and normal heart sounds.  Exam reveals no gallop and no friction rub.   No murmur heard. Pulmonary/Chest: Effort normal and breath  sounds normal. She has no wheezes. She exhibits no tenderness.  Lymphadenopathy:    She has no cervical adenopathy.  Neurological: She is alert.  Skin: Skin is warm and dry. No rash noted.  Psychiatric: She has a normal mood and affect.          Assessment & Plan:

## 2012-07-05 ENCOUNTER — Encounter (HOSPITAL_COMMUNITY): Payer: Self-pay | Admitting: Pharmacy Technician

## 2012-07-07 ENCOUNTER — Encounter (HOSPITAL_COMMUNITY): Payer: Self-pay

## 2012-07-07 ENCOUNTER — Ambulatory Visit (HOSPITAL_COMMUNITY)
Admission: RE | Admit: 2012-07-07 | Discharge: 2012-07-07 | Disposition: A | Discharge status: Home / Self Care | Payer: BC Managed Care – PPO | Source: Ambulatory Visit | Attending: Orthopedic Surgery | Admitting: Orthopedic Surgery

## 2012-07-07 ENCOUNTER — Encounter (HOSPITAL_COMMUNITY)
Admission: RE | Admit: 2012-07-07 | Discharge: 2012-07-07 | Disposition: A | Discharge status: Home / Self Care | Payer: BC Managed Care – PPO | Source: Ambulatory Visit | Attending: Orthopedic Surgery | Admitting: Orthopedic Surgery

## 2012-07-07 DIAGNOSIS — I10 Essential (primary) hypertension: Secondary | ICD-10-CM | POA: Insufficient documentation

## 2012-07-07 DIAGNOSIS — Z01812 Encounter for preprocedural laboratory examination: Secondary | ICD-10-CM | POA: Insufficient documentation

## 2012-07-07 DIAGNOSIS — Z01818 Encounter for other preprocedural examination: Secondary | ICD-10-CM | POA: Insufficient documentation

## 2012-07-07 HISTORY — DX: Nausea with vomiting, unspecified: R11.2

## 2012-07-07 HISTORY — DX: Cough: R05

## 2012-07-07 HISTORY — DX: Other specified postprocedural states: Z98.890

## 2012-07-07 HISTORY — DX: Cough, unspecified: R05.9

## 2012-07-07 LAB — CBC
HCT: 39.1 % (ref 36.0–46.0)
Hemoglobin: 13.2 g/dL (ref 12.0–15.0)
MCH: 30.4 pg (ref 26.0–34.0)
MCHC: 33.8 g/dL (ref 30.0–36.0)
MCV: 90.1 fL (ref 78.0–100.0)
Platelets: 310 10*3/uL (ref 150–400)
RBC: 4.34 MIL/uL (ref 3.87–5.11)
RDW: 12.7 % (ref 11.5–15.5)
WBC: 9.2 10*3/uL (ref 4.0–10.5)

## 2012-07-07 LAB — BASIC METABOLIC PANEL
BUN: 20 mg/dL (ref 6–23)
CO2: 25 mEq/L (ref 19–32)
Calcium: 9.5 mg/dL (ref 8.4–10.5)
Chloride: 101 mEq/L (ref 96–112)
Creatinine, Ser: 0.82 mg/dL (ref 0.50–1.10)
GFR calc Af Amer: 89 mL/min — ABNORMAL LOW (ref >90)
GFR calc non Af Amer: 77 mL/min — ABNORMAL LOW (ref >90)
Glucose, Bld: 112 mg/dL — ABNORMAL HIGH (ref 70–99)
Potassium: 4 mEq/L (ref 3.5–5.1)
Sodium: 139 mEq/L (ref 135–145)

## 2012-07-07 LAB — TYPE AND SCREEN
ABO/RH(D): O POS
Antibody Screen: NEGATIVE

## 2012-07-07 LAB — ABO/RH: ABO/RH(D): O POS

## 2012-07-07 LAB — SURGICAL PCR SCREEN
MRSA, PCR: NEGATIVE
Staphylococcus aureus: NEGATIVE

## 2012-07-07 NOTE — Pre-Procedure Instructions (Signed)
Connie Haynes  07/07/2012   Your procedure is scheduled on:  07/15/12  Report to [facility] at 830 AM.  Call this number if you have problems the morning of surgery: [phone]   Remember:   Do not eat food or drink liquids after midnight.   Take these medicines the morning of surgery with A SIP OF WATER: cymbalta   Do not wear jewelry, make-up or nail polish.  Do not wear lotions, powders, or perfumes. You may wear deodorant.  Do not shave 48 hours prior to surgery. Men may shave face and neck.  Do not bring valuables to the hospital.  [facility] is not responsible                   for any belongings or valuables.  Contacts, dentures or bridgework may not be worn into surgery.  Leave suitcase in the car. After surgery it may be brought to your room.  For patients admitted to the hospital, checkout time is 11:00 AM the day of  discharge.   Patients discharged the day of surgery will not be allowed to drive  home.  Name and phone number of your driver: family  Special Instructions: Shower using CHG 2 nights before surgery and the night before surgery.  If you shower the day of surgery use CHG.  Use special wash - you have one bottle of CHG for all showers.  You should use approximately 1/3 of the bottle for each shower.   Please read over the following fact sheets that you were given: Pain Booklet, Coughing and Deep Breathing, Blood Transfusion Information, MRSA Information and Surgical Site Infection Prevention

## 2012-07-07 NOTE — Progress Notes (Signed)
Ablation was done in 1993 at [facility].   Dr Conni Slipper.    Pt stopped seeing Dr Luella Cook > 1 yr ago.

## 2012-07-11 ENCOUNTER — Encounter (HOSPITAL_COMMUNITY): Payer: Self-pay

## 2012-07-11 NOTE — Progress Notes (Addendum)
Anesthesia Chart Review:  Patient is a 57 year old female scheduled for left total shoulder arthroplasty on 07/15/12 by Dr. Ranell Patrick.  History includes morbid obesity, WPW s/p ablation at [facility] (Dr. Conni Slipper) in 1993, HTN, HLD, IBS, OA, nephrolithiasis, post-operative N/V, cyclical cough without true asthma and felt related to post-nasal drip and GERD (Pulmonologist Dr. Shan Levans), s/p right THA '08, left THA '09, c-spine surgery. She reported seeing cardiology Dr. Luella Cook at [facility] in the past (not since 01/2008), but is now followed by her PCP Dr. Birdena Jubilee who cleared patient for this procedure.    EKG (PCP) done on 06/02/12 showed NSR, occasional PACs, RSR prime in V1.  Preoperative CXR and labs noted.  Patient with a remote history of ablation for WPW.  She is no longer followed by a cardiologist, and is now followed by her PCP who cleared her for this procedure.  If no acute changes then I would anticipate that she could proceed as planned.   Velna Ochs [facility]/Anesthesiology Phone [phone] 07/11/2012 4:56 PM

## 2012-07-14 ENCOUNTER — Encounter: Payer: Self-pay | Admitting: *Deleted

## 2012-07-14 MED ORDER — CEFAZOLIN SODIUM-DEXTROSE 2-3 GM-% IV SOLR
2.0000 g | INTRAVENOUS | Status: AC
  Administered 2012-07-15: 2 g via INTRAVENOUS
  Filled 2012-07-14: qty 50

## 2012-07-15 ENCOUNTER — Inpatient Hospital Stay (HOSPITAL_COMMUNITY): Payer: BC Managed Care – PPO | Admitting: Anesthesiology

## 2012-07-15 ENCOUNTER — Encounter (HOSPITAL_COMMUNITY): Payer: Self-pay | Admitting: Vascular Surgery

## 2012-07-15 ENCOUNTER — Inpatient Hospital Stay (HOSPITAL_COMMUNITY)
Admission: RE | Admit: 2012-07-15 | Discharge: 2012-07-17 | DRG: 491 | Disposition: A | Discharge status: Home / Self Care | Payer: BC Managed Care – PPO | Source: Ambulatory Visit | Attending: Orthopedic Surgery | Admitting: Orthopedic Surgery

## 2012-07-15 ENCOUNTER — Inpatient Hospital Stay (HOSPITAL_COMMUNITY): Payer: BC Managed Care – PPO

## 2012-07-15 ENCOUNTER — Encounter (HOSPITAL_COMMUNITY): Payer: Self-pay | Admitting: Critical Care Medicine

## 2012-07-15 ENCOUNTER — Encounter (HOSPITAL_COMMUNITY)
Admission: RE | Disposition: A | Discharge status: Home / Self Care | Payer: Self-pay | Source: Ambulatory Visit | Attending: Orthopedic Surgery

## 2012-07-15 DIAGNOSIS — N200 Calculus of kidney: Secondary | ICD-10-CM | POA: Diagnosis present

## 2012-07-15 DIAGNOSIS — K589 Irritable bowel syndrome without diarrhea: Secondary | ICD-10-CM | POA: Diagnosis present

## 2012-07-15 DIAGNOSIS — Z96649 Presence of unspecified artificial hip joint: Secondary | ICD-10-CM

## 2012-07-15 DIAGNOSIS — E785 Hyperlipidemia, unspecified: Secondary | ICD-10-CM | POA: Diagnosis present

## 2012-07-15 DIAGNOSIS — I10 Essential (primary) hypertension: Secondary | ICD-10-CM | POA: Diagnosis present

## 2012-07-15 DIAGNOSIS — I456 Pre-excitation syndrome: Secondary | ICD-10-CM | POA: Diagnosis present

## 2012-07-15 DIAGNOSIS — M19019 Primary osteoarthritis, unspecified shoulder: Principal | ICD-10-CM | POA: Diagnosis present

## 2012-07-15 HISTORY — PX: TOTAL SHOULDER ARTHROPLASTY: SHX126

## 2012-07-15 SURGERY — ARTHROPLASTY, SHOULDER, TOTAL
Anesthesia: General | Site: Shoulder | Laterality: Left | Wound class: Clean

## 2012-07-15 MED ORDER — FENTANYL CITRATE 0.05 MG/ML IJ SOLN
INTRAMUSCULAR | Status: DC | PRN
  Administered 2012-07-15: 50 ug via INTRAVENOUS

## 2012-07-15 MED ORDER — PHENOL 1.4 % MT LIQD: 1.0000 | OROMUCOSAL | Status: DC | PRN

## 2012-07-15 MED ORDER — PROPOFOL 10 MG/ML IV BOLUS
INTRAVENOUS | Status: DC | PRN
  Administered 2012-07-15: 160 mg via INTRAVENOUS

## 2012-07-15 MED ORDER — ONDANSETRON HCL 4 MG/2ML IJ SOLN: 4.0000 mg | Freq: Four times a day (QID) | INTRAMUSCULAR | Status: DC | PRN

## 2012-07-15 MED ORDER — MENTHOL 3 MG MT LOZG: 1.0000 | LOZENGE | OROMUCOSAL | Status: DC | PRN

## 2012-07-15 MED ORDER — TRAMADOL HCL 50 MG PO TABS
50.0000 mg | ORAL_TABLET | Freq: Four times a day (QID) | ORAL | Status: DC | PRN
  Administered 2012-07-16: 50 mg via ORAL
  Filled 2012-07-15: qty 1

## 2012-07-15 MED ORDER — PRESCRIPTION MEDICATION: 1.0000 "application " | Freq: Two times a day (BID) | Status: DC

## 2012-07-15 MED ORDER — GLYCOPYRROLATE 0.2 MG/ML IJ SOLN
INTRAMUSCULAR | Status: DC | PRN
  Administered 2012-07-15: 0.2 mg via INTRAVENOUS
  Administered 2012-07-15: 0.1 mg via INTRAVENOUS

## 2012-07-15 MED ORDER — METHOCARBAMOL 100 MG/ML IJ SOLN
500.0000 mg | Freq: Four times a day (QID) | INTRAVENOUS | Status: DC | PRN
  Filled 2012-07-15: qty 5

## 2012-07-15 MED ORDER — LOSARTAN POTASSIUM-HCTZ 100-12.5 MG PO TABS: 1.0000 | ORAL_TABLET | Freq: Two times a day (BID) | ORAL | Status: DC

## 2012-07-15 MED ORDER — MORPHINE SULFATE 2 MG/ML IJ SOLN: 2.0000 mg | INTRAMUSCULAR | Status: DC | PRN

## 2012-07-15 MED ORDER — OXYCODONE HCL 5 MG PO TABS
5.0000 mg | ORAL_TABLET | ORAL | Status: DC | PRN
  Administered 2012-07-15 – 2012-07-17 (×11): 10 mg via ORAL
  Filled 2012-07-15 (×10): qty 2

## 2012-07-15 MED ORDER — METHOCARBAMOL 500 MG PO TABS
ORAL_TABLET | ORAL | Status: AC
  Filled 2012-07-15: qty 1

## 2012-07-15 MED ORDER — POTASSIUM CHLORIDE IN NACL 20-0.9 MEQ/L-% IV SOLN
INTRAVENOUS | Status: DC
  Administered 2012-07-15: 18:00:00 via INTRAVENOUS
  Filled 2012-07-15 (×3): qty 1000

## 2012-07-15 MED ORDER — METOCLOPRAMIDE HCL 5 MG/ML IJ SOLN: 5.0000 mg | Freq: Three times a day (TID) | INTRAMUSCULAR | Status: DC | PRN

## 2012-07-15 MED ORDER — BUPIVACAINE-EPINEPHRINE PF 0.25-1:200000 % IJ SOLN
INTRAMUSCULAR | Status: AC
  Filled 2012-07-15: qty 30

## 2012-07-15 MED ORDER — PHENYLEPHRINE HCL 10 MG/ML IJ SOLN
10.0000 mg | INTRAVENOUS | Status: DC | PRN
  Administered 2012-07-15: 25 ug/min via INTRAVENOUS

## 2012-07-15 MED ORDER — NEBIVOLOL HCL 10 MG PO TABS
10.0000 mg | ORAL_TABLET | Freq: Every day | ORAL | Status: DC
Start: 2012-07-16 — End: 2012-07-17
  Administered 2012-07-16 – 2012-07-17 (×2): 10 mg via ORAL
  Filled 2012-07-15 (×2): qty 1

## 2012-07-15 MED ORDER — BENZONATATE 100 MG PO CAPS
200.0000 mg | ORAL_CAPSULE | Freq: Three times a day (TID) | ORAL | Status: DC | PRN
Start: 2012-07-15 — End: 2012-07-17
  Filled 2012-07-15: qty 2

## 2012-07-15 MED ORDER — ACETAMINOPHEN 650 MG RE SUPP: 650.0000 mg | Freq: Four times a day (QID) | RECTAL | Status: DC | PRN

## 2012-07-15 MED ORDER — OXYCODONE HCL 5 MG PO TABS
ORAL_TABLET | ORAL | Status: AC
  Filled 2012-07-15: qty 2

## 2012-07-15 MED ORDER — MIDAZOLAM HCL 2 MG/2ML IJ SOLN
INTRAMUSCULAR | Status: AC
  Administered 2012-07-15: 2 mg
  Filled 2012-07-15: qty 2

## 2012-07-15 MED ORDER — LUBIPROSTONE 24 MCG PO CAPS
24.0000 ug | ORAL_CAPSULE | Freq: Two times a day (BID) | ORAL | Status: DC
  Administered 2012-07-15 – 2012-07-17 (×4): 24 ug via ORAL
  Filled 2012-07-15 (×6): qty 1

## 2012-07-15 MED ORDER — ONE-DAILY MULTI VITAMINS PO TABS: 1.0000 | ORAL_TABLET | Freq: Every day | ORAL | Status: DC

## 2012-07-15 MED ORDER — MELOXICAM 7.5 MG PO TABS
7.5000 mg | ORAL_TABLET | Freq: Every day | ORAL | Status: DC
Start: 2012-07-15 — End: 2012-07-17
  Administered 2012-07-15 – 2012-07-17 (×3): 7.5 mg via ORAL
  Filled 2012-07-15 (×3): qty 1

## 2012-07-15 MED ORDER — CEFAZOLIN SODIUM-DEXTROSE 2-3 GM-% IV SOLR
2.0000 g | Freq: Four times a day (QID) | INTRAVENOUS | Status: AC
  Administered 2012-07-15 – 2012-07-16 (×3): 2 g via INTRAVENOUS
  Filled 2012-07-15 (×3): qty 50

## 2012-07-15 MED ORDER — BUPIVACAINE-EPINEPHRINE 0.25% -1:200000 IJ SOLN
INTRAMUSCULAR | Status: DC | PRN
  Administered 2012-07-15: 10 mL

## 2012-07-15 MED ORDER — ROPIVACAINE HCL 5 MG/ML IJ SOLN
INTRAMUSCULAR | Status: DC | PRN
  Administered 2012-07-15: 30 mL

## 2012-07-15 MED ORDER — HEMOSTATIC AGENTS (NO CHARGE) OPTIME
TOPICAL | Status: DC | PRN
  Administered 2012-07-15: 1 via TOPICAL

## 2012-07-15 MED ORDER — VITAMIN D 1000 UNITS PO CAPS: 2000.0000 [IU] | ORAL_CAPSULE | Freq: Every day | ORAL | Status: DC

## 2012-07-15 MED ORDER — SUCCINYLCHOLINE CHLORIDE 20 MG/ML IJ SOLN
INTRAMUSCULAR | Status: DC | PRN
  Administered 2012-07-15: 100 mg via INTRAVENOUS

## 2012-07-15 MED ORDER — PHENYLEPHRINE HCL 10 MG/ML IJ SOLN
INTRAMUSCULAR | Status: DC | PRN
  Administered 2012-07-15 (×2): 80 ug via INTRAVENOUS
  Administered 2012-07-15: 40 ug via INTRAVENOUS

## 2012-07-15 MED ORDER — SODIUM CHLORIDE 0.9 % IR SOLN
Status: DC | PRN
  Administered 2012-07-15: 1000 mL

## 2012-07-15 MED ORDER — ONDANSETRON HCL 4 MG/2ML IJ SOLN
INTRAMUSCULAR | Status: DC | PRN
  Administered 2012-07-15 (×2): 4 mg via INTRAVENOUS

## 2012-07-15 MED ORDER — CHLORHEXIDINE GLUCONATE 4 % EX LIQD: 60.0000 mL | Freq: Once | CUTANEOUS | Status: DC

## 2012-07-15 MED ORDER — ROPINIROLE HCL 1 MG PO TABS
1.0000 mg | ORAL_TABLET | Freq: Every evening | ORAL | Status: DC | PRN
  Filled 2012-07-15: qty 1

## 2012-07-15 MED ORDER — ADULT MULTIVITAMIN W/MINERALS CH
1.0000 | ORAL_TABLET | Freq: Every day | ORAL | Status: DC
  Administered 2012-07-15 – 2012-07-17 (×3): 1 via ORAL
  Filled 2012-07-15 (×3): qty 1

## 2012-07-15 MED ORDER — METHOCARBAMOL 500 MG PO TABS
500.0000 mg | ORAL_TABLET | Freq: Four times a day (QID) | ORAL | Status: DC | PRN
  Administered 2012-07-15 – 2012-07-17 (×6): 500 mg via ORAL
  Filled 2012-07-15 (×6): qty 1

## 2012-07-15 MED ORDER — LACTATED RINGERS IV SOLN
INTRAVENOUS | Status: DC
  Administered 2012-07-15 (×2): via INTRAVENOUS

## 2012-07-15 MED ORDER — ONDANSETRON HCL 4 MG PO TABS
4.0000 mg | ORAL_TABLET | Freq: Four times a day (QID) | ORAL | Status: DC | PRN
  Administered 2012-07-16: 4 mg via ORAL
  Filled 2012-07-15: qty 1

## 2012-07-15 MED ORDER — LIDOCAINE HCL (CARDIAC) 20 MG/ML IV SOLN
INTRAVENOUS | Status: DC | PRN
  Administered 2012-07-15: 80 mg via INTRAVENOUS

## 2012-07-15 MED ORDER — CLONAZEPAM 1 MG PO TABS
1.0000 mg | ORAL_TABLET | Freq: Every evening | ORAL | Status: DC | PRN
  Administered 2012-07-16: 1 mg via ORAL
  Filled 2012-07-15: qty 1

## 2012-07-15 MED ORDER — DEXAMETHASONE SODIUM PHOSPHATE 4 MG/ML IJ SOLN
INTRAMUSCULAR | Status: DC | PRN
  Administered 2012-07-15: 8 mg via INTRAVENOUS

## 2012-07-15 MED ORDER — PROMETHAZINE HCL 25 MG PO TABS
25.0000 mg | ORAL_TABLET | Freq: Four times a day (QID) | ORAL | Status: DC | PRN
  Administered 2012-07-15 – 2012-07-17 (×6): 25 mg via ORAL
  Filled 2012-07-15 (×6): qty 1

## 2012-07-15 MED ORDER — THROMBIN 5000 UNITS EX SOLR
CUTANEOUS | Status: AC
  Filled 2012-07-15: qty 5000

## 2012-07-15 MED ORDER — FENTANYL CITRATE 0.05 MG/ML IJ SOLN
INTRAMUSCULAR | Status: AC
  Administered 2012-07-15: 100 ug
  Filled 2012-07-15: qty 2

## 2012-07-15 MED ORDER — DULOXETINE HCL 30 MG PO CPEP
30.0000 mg | ORAL_CAPSULE | Freq: Every day | ORAL | Status: DC
  Administered 2012-07-15 – 2012-07-17 (×3): 30 mg via ORAL
  Filled 2012-07-15 (×3): qty 1

## 2012-07-15 MED ORDER — VITAMIN D3 25 MCG (1000 UNIT) PO TABS
2000.0000 [IU] | ORAL_TABLET | Freq: Every day | ORAL | Status: DC
  Administered 2012-07-15 – 2012-07-17 (×3): 2000 [IU] via ORAL
  Filled 2012-07-15 (×3): qty 2

## 2012-07-15 MED ORDER — METOCLOPRAMIDE HCL 10 MG PO TABS
5.0000 mg | ORAL_TABLET | Freq: Three times a day (TID) | ORAL | Status: DC | PRN
  Administered 2012-07-16 – 2012-07-17 (×3): 10 mg via ORAL
  Filled 2012-07-15 (×2): qty 1
  Filled 2012-07-15: qty 2

## 2012-07-15 MED ORDER — ACETAMINOPHEN 325 MG PO TABS
650.0000 mg | ORAL_TABLET | Freq: Four times a day (QID) | ORAL | Status: DC | PRN
  Administered 2012-07-16 – 2012-07-17 (×2): 650 mg via ORAL
  Filled 2012-07-15 (×2): qty 2

## 2012-07-15 SURGICAL SUPPLY — 68 items
BLADE SAW SAG 73X25 THK (BLADE) ×1
BLADE SAW SGTL 73X25 THK (BLADE) ×1 IMPLANT
BUR SURG 4X8 MED (BURR) IMPLANT
BURR SURG 4X8 MED (BURR)
CEMENT BONE DEPUY (Cement) ×2 IMPLANT
CLOTH BEACON ORANGE TIMEOUT ST (SAFETY) ×2 IMPLANT
COVER SURGICAL LIGHT HANDLE (MISCELLANEOUS) ×2 IMPLANT
DRAPE INCISE IOBAN 66X45 STRL (DRAPES) ×6 IMPLANT
DRAPE U-SHAPE 47X51 STRL (DRAPES) ×2 IMPLANT
DRAPE X-RAY CASS 24X20 (DRAPES) IMPLANT
DRILL BIT 5/64 (BIT) ×2 IMPLANT
DRSG ADAPTIC 3X8 NADH LF (GAUZE/BANDAGES/DRESSINGS) ×2 IMPLANT
DRSG PAD ABDOMINAL 8X10 ST (GAUZE/BANDAGES/DRESSINGS) ×2 IMPLANT
DURAPREP 26ML APPLICATOR (WOUND CARE) ×2 IMPLANT
ELECT BLADE 4.0 EZ CLEAN MEGAD (MISCELLANEOUS) ×2
ELECT NEEDLE TIP 2.8 STRL (NEEDLE) ×2 IMPLANT
ELECT REM PT RETURN 9FT ADLT (ELECTROSURGICAL) ×2
ELECTRODE BLDE 4.0 EZ CLN MEGD (MISCELLANEOUS) ×1 IMPLANT
ELECTRODE REM PT RTRN 9FT ADLT (ELECTROSURGICAL) ×1 IMPLANT
GLENOID ANCHOR PEG CROSSLK 44 (Orthopedic Implant) ×2 IMPLANT
GLOVE BIOGEL PI ORTHO PRO 7.5 (GLOVE) ×1
GLOVE BIOGEL PI ORTHO PRO SZ8 (GLOVE) ×1
GLOVE ORTHO TXT STRL SZ7.5 (GLOVE) ×2 IMPLANT
GLOVE PI ORTHO PRO STRL 7.5 (GLOVE) ×1 IMPLANT
GLOVE PI ORTHO PRO STRL SZ8 (GLOVE) ×1 IMPLANT
GLOVE SURG ORTHO 8.5 STRL (GLOVE) ×4 IMPLANT
GOWN STRL REIN XL XLG (GOWN DISPOSABLE) ×6 IMPLANT
HANDPIECE INTERPULSE COAX TIP (DISPOSABLE)
HEAD HUM ECCENTRIC 44X18 STRL (Trauma) ×2 IMPLANT
HUMERAL STEM 10MM (Trauma) ×2 IMPLANT
KIT BASIN OR (CUSTOM PROCEDURE TRAY) ×2 IMPLANT
KIT ROOM TURNOVER OR (KITS) ×2 IMPLANT
MANIFOLD NEPTUNE II (INSTRUMENTS) ×2 IMPLANT
NDL SUT 6 .5 CRC .975X.05 MAYO (NEEDLE) ×1 IMPLANT
NEEDLE 1/2 CIR MAYO (NEEDLE) ×2 IMPLANT
NEEDLE HYPO 25GX1X1/2 BEV (NEEDLE) ×2 IMPLANT
NEEDLE MAYO TAPER (NEEDLE) ×1
NS IRRIG 1000ML POUR BTL (IV SOLUTION) ×2 IMPLANT
PACK SHOULDER (CUSTOM PROCEDURE TRAY) ×2 IMPLANT
PAD ARMBOARD 7.5X6 YLW CONV (MISCELLANEOUS) ×4 IMPLANT
SET HNDPC FAN SPRY TIP SCT (DISPOSABLE) IMPLANT
SLING ARM IMMOBILIZER LRG (SOFTGOODS) ×2 IMPLANT
SLING ARM IMMOBILIZER MED (SOFTGOODS) IMPLANT
SLING SWATHE LARGE (SOFTGOODS) ×2 IMPLANT
SMARTMIX MINI TOWER (MISCELLANEOUS) ×2
SPONGE GAUZE 4X4 12PLY (GAUZE/BANDAGES/DRESSINGS) ×2 IMPLANT
SPONGE LAP 18X18 X RAY DECT (DISPOSABLE) ×2 IMPLANT
SPONGE LAP 4X18 X RAY DECT (DISPOSABLE) ×2 IMPLANT
SPONGE SURGIFOAM ABS GEL SZ50 (HEMOSTASIS) IMPLANT
STEM HUMERAL 10MM (Trauma) ×1 IMPLANT
STRIP CLOSURE SKIN 1/2X4 (GAUZE/BANDAGES/DRESSINGS) ×2 IMPLANT
SUCTION FRAZIER TIP 10 FR DISP (SUCTIONS) ×2 IMPLANT
SUT FIBERWIRE #2 38 T-5 BLUE (SUTURE) ×8
SUT MNCRL AB 4-0 PS2 18 (SUTURE) ×2 IMPLANT
SUT VIC AB 0 CT1 27 (SUTURE) ×1
SUT VIC AB 0 CT1 27XBRD ANBCTR (SUTURE) ×1 IMPLANT
SUT VIC AB 2-0 CT1 27 (SUTURE) ×2
SUT VIC AB 2-0 CT1 TAPERPNT 27 (SUTURE) ×2 IMPLANT
SUT VICRYL AB 2 0 TIES (SUTURE) ×2 IMPLANT
SUTURE FIBERWR #2 38 T-5 BLUE (SUTURE) ×4 IMPLANT
SYR CONTROL 10ML LL (SYRINGE) ×2 IMPLANT
TAPE CLOTH SURG 6X10 WHT LF (GAUZE/BANDAGES/DRESSINGS) ×2 IMPLANT
TOWEL OR 17X24 6PK STRL BLUE (TOWEL DISPOSABLE) ×2 IMPLANT
TOWEL OR 17X26 10 PK STRL BLUE (TOWEL DISPOSABLE) ×2 IMPLANT
TOWER SMARTMIX MINI (MISCELLANEOUS) ×1 IMPLANT
TRAY FOLEY CATH 14FR (SET/KITS/TRAYS/PACK) ×2 IMPLANT
WATER STERILE IRR 1000ML POUR (IV SOLUTION) ×2 IMPLANT
YANKAUER SUCT BULB TIP NO VENT (SUCTIONS) IMPLANT

## 2012-07-15 NOTE — Brief Op Note (Signed)
07/15/2012  1:15 PM  PATIENT:  Connie Haynes  57 y.o. female  PRE-OPERATIVE DIAGNOSIS:  LEFT SHOULDER OA, END STAGE  POST-OPERATIVE DIAGNOSIS:  LEFT SHOULDER OA, END STAGE  PROCEDURE:  Procedure(s): LEFT TOTAL SHOULDER ARTHROPLASTY (Left),  [employer] APG  SURGEON:  Surgeon(s) and Role:    * Verlee Rossetti, MD - Primary  PHYSICIAN ASSISTANT:   ASSISTANTS: Thea Gist, PA-C   ANESTHESIA:   local, regional and general  EBL:  Total I/O In: 1500 [I.V.:1500] Out: 125 [Blood:125]  BLOOD ADMINISTERED:none  DRAINS: none   LOCAL MEDICATIONS USED:  MARCAINE     SPECIMEN:  No Specimen  DISPOSITION OF SPECIMEN:  N/A  COUNTS:  YES  TOURNIQUET:  * No tourniquets in log *  DICTATION: .Other Dictation: Dictation Number [id number]  PLAN OF CARE: Admit to inpatient   PATIENT DISPOSITION:  PACU - hemodynamically stable.   Delay start of Pharmacological VTE agent (>24hrs) due to surgical blood loss or risk of bleeding: not applicable

## 2012-07-15 NOTE — Transfer of Care (Signed)
Immediate Anesthesia Transfer of Care Note  Patient: Connie Haynes  Procedure(s) Performed: Procedure(s): LEFT TOTAL SHOULDER ARTHROPLASTY (Left)  Patient Location: PACU  Anesthesia Type:GA combined with regional for post-op pain  Level of Consciousness: awake, alert  and oriented  Airway & Oxygen Therapy: Patient Spontanous Breathing and Patient connected to nasal cannula oxygen  Post-op Assessment: Report given to PACU RN, Post -op Vital signs reviewed and stable and Patient moving all extremities  Post vital signs: Reviewed and stable  Complications: No apparent anesthesia complications

## 2012-07-15 NOTE — Interval H&P Note (Signed)
History and Physical Interval Note:  07/15/2012 10:11 AM  Connie Haynes  has presented today for surgery, with the diagnosis of LEFT SHOULDER OA  The various methods of treatment have been discussed with the patient and family. After consideration of risks, benefits and other options for treatment, the patient has consented to  Procedure(s): LEFT TOTAL SHOULDER ARTHROPLASTY (Left) as a surgical intervention .  The patient's history has been reviewed, patient examined, no change in status, stable for surgery.  I have reviewed the patient's chart and labs.  Questions were answered to the patient's satisfaction.     Trisa Cranor,STEVEN R

## 2012-07-15 NOTE — Progress Notes (Signed)
Utilization review completed. Stanly Si, RN, BSN. 

## 2012-07-15 NOTE — Anesthesia Procedure Notes (Addendum)
Anesthesia Regional Block:  Supraclavicular block  Pre-Anesthetic Checklist: ,, timeout performed, Correct Patient, Correct Site, Correct Laterality, Correct Procedure, Correct Position, site marked, Risks and benefits discussed, Surgical consent,  Pre-op evaluation,  At surgeon's request  Laterality: Left and Upper  Prep: chloraprep       Needles:   Needle Type: Echogenic Needle      Needle Gauge: 22 and 22 G    Additional Needles:  Procedures: Doppler guided and ultrasound guided (picture in chart) Supraclavicular block Narrative:  Start time: 07/15/2012 10:00 AM End time: 07/15/2012 10:23 AM Injection made incrementally with aspirations every 5 mL.  Performed by: Personally  Anesthesiologist: T Massagee  Additional Notes: Tolerated well.   Procedure Name: Intubation Date/Time: 07/15/2012 10:50 AM Performed by: Elon Alas Pre-anesthesia Checklist: Patient identified, Timeout performed, Emergency Drugs available, Suction available and Patient being monitored Patient Re-evaluated:Patient Re-evaluated prior to inductionOxygen Delivery Method: Circle system utilized Preoxygenation: Pre-oxygenation with 100% oxygen Intubation Type: IV induction Ventilation: Mask ventilation without difficulty Laryngoscope Size: Mac and 3 Grade View: Grade I Tube type: Oral Tube size: 7.0 mm Number of attempts: 1 Airway Equipment and Method: Stylet Placement Confirmation: positive ETCO2,  ETT inserted through vocal cords under direct vision and breath sounds checked- equal and bilateral Secured at: 21 cm Tube secured with: Tape Dental Injury: Teeth and Oropharynx as per pre-operative assessment

## 2012-07-15 NOTE — Anesthesia Postprocedure Evaluation (Signed)
Anesthesia Post Note  Patient: Connie Haynes  Procedure(s) Performed: Procedure(s) (LRB): LEFT TOTAL SHOULDER ARTHROPLASTY (Left)  Anesthesia type: general  Patient location: PACU  Post pain: Pain level controlled  Post assessment: Patient's Cardiovascular Status Stable  Last Vitals:  Filed Vitals:   07/15/12 1315  BP:   Pulse:   Temp: 36.6 C  Resp:     Post vital signs: Reviewed and stable  Level of consciousness: sedated  Complications: No apparent anesthesia complications

## 2012-07-15 NOTE — Preoperative (Signed)
Beta Blockers   Reason not to administer Beta Blockers:Not Applicable 

## 2012-07-15 NOTE — Anesthesia Preprocedure Evaluation (Addendum)
Anesthesia Evaluation  Patient identified by MRN, date of birth, ID band Patient awake    History of Anesthesia Complications (+) PONV  Airway Mallampati: II  Neck ROM: Full    Dental  (+) Teeth Intact and Dental Advisory Given   Pulmonary  breath sounds clear to auscultation        Cardiovascular hypertension, + dysrhythmias Rhythm:Regular Rate:Normal     Neuro/Psych PSYCHIATRIC DISORDERS Anxiety    GI/Hepatic   Endo/Other  Morbid obesity  Renal/GU      Musculoskeletal  (+) Arthritis -, Osteoarthritis,    Abdominal (+) + obese,   Peds  Hematology   Anesthesia Other Findings   Reproductive/Obstetrics                         Anesthesia Physical Anesthesia Plan  ASA: III  Anesthesia Plan:    Post-op Pain Management:    Induction: Intravenous  Airway Management Planned: Oral ETT  Additional Equipment:   Intra-op Plan:   Post-operative Plan: Extubation in OR  Informed Consent: I have reviewed the patients History and Physical, chart, labs and discussed the procedure including the risks, benefits and alternatives for the proposed anesthesia with the patient or authorized representative who has indicated his/her understanding and acceptance.   Dental advisory given  Plan Discussed with: CRNA and Surgeon  Anesthesia Plan Comments:        Anesthesia Quick Evaluation

## 2012-07-16 ENCOUNTER — Encounter: Payer: Self-pay | Admitting: Family Medicine

## 2012-07-16 LAB — BASIC METABOLIC PANEL
BUN: 13 mg/dL (ref 6–23)
CO2: 24 mEq/L (ref 19–32)
Calcium: 8.8 mg/dL (ref 8.4–10.5)
Chloride: 104 mEq/L (ref 96–112)
Creatinine, Ser: 0.79 mg/dL (ref 0.50–1.10)
GFR calc Af Amer: >90 mL/min (ref >90)
GFR calc non Af Amer: 89 mL/min — ABNORMAL LOW (ref >90)
Glucose, Bld: 127 mg/dL — ABNORMAL HIGH (ref 70–99)
Potassium: 4.1 mEq/L (ref 3.5–5.1)
Sodium: 138 mEq/L (ref 135–145)

## 2012-07-16 LAB — HEMOGLOBIN AND HEMATOCRIT, BLOOD
HCT: 32.8 % — ABNORMAL LOW (ref 36.0–46.0)
Hemoglobin: 11 g/dL — ABNORMAL LOW (ref 12.0–15.0)

## 2012-07-16 NOTE — Op Note (Signed)
NAMEEMILYNN, SRINIVASAN                  ACCOUNT NO.:  [id number]  MEDICAL RECORD NO.:  [id number]  LOCATION:  [id number]                        FACILITY:  [facility]  PHYSICIAN:  Almedia Balls. Ranell Patrick, M.D. DATE OF BIRTH:  20-Sep-1955  DATE OF PROCEDURE:  07/15/2012 DATE OF DISCHARGE:                              OPERATIVE REPORT   PREOPERATIVE DIAGNOSIS:  Left shoulder end-stage osteoarthritis.  POSTOPERATIVE DIAGNOSIS:  Left shoulder end-stage osteoarthritis.  PROCEDURE PERFORMED:  Left shoulder total shoulder arthroplasty using [employer]vantage system.  ATTENDING SURGEON:  Almedia Balls. Ranell Patrick, M.D.  ASSISTANT:  Donnie Coffin. Dixon, P.A., who scrubbed during the entire procedure and necessary for satisfactory completion of surgery.  ANESTHESIA:  General anesthesia was used plus interscalene block.  ESTIMATED BLOOD LOSS:  100 mL.  FLUID REPLACED:  1005 mL crystalloids.  INSTRUMENT COUNTS:  Correct.  COMPLICATIONS:  There were no complications.  ANTIBIOTICS:  Perioperative antibiotics were given.  INDICATIONS:  The patient is a 57 year old female with end-stage arthritis to her left shoulder.  The patient has disabling pain and limited function affecting ADLs and sleep.  The patient has failed conservative management consisting of injections, modification of activity, pain medications, and presents for operative treatment to restore function, eliminate pain in her shoulder.  Informed consent was obtained.  DESCRIPTION OF PROCEDURE:  After adequate level of anesthesia, the patient was positioned in the modified beach-chair position.  Left shoulder correctly identified, sterilely prepped, draped in the usual manner.  Time-out called.  We entered the shoulder using standard deltopectoral incision, started at the coracoid process extending down to the anterior humerus.  Dissection down through subcutaneous tissues. Deltopectoral was identified, cephalic vein taken in line with the deltoid.   Pectoralis was taken medially.  The upper 0.5 cm pectoralis was released off the humeral side.  Conjoined tendon was identified and retracted medially.  Deep retractors placed.  We then released the subscapularis subperiosteally with needle-tip Bovie.  Placed #2 FiberWire suture in a modified Mason-Allen suture technique in the free end of the tendon for repair at the end of the case.  We drilled the rotator interval and released the capsule off the inferior humeral neck as we progressively externally rotated the humerus.  We then assessed the rotator cuff.  It felt robust and no signs of any tearing.  We made a decision to proceed with a standard total shoulder arthroplasty, and the biceps was intact.  We left that in place to protect the leading edge of supraspinatus.  We placed our T-handled retractor, then resected with about 15 degrees of external rotation to 50 degrees of retroversion.  The humeral head with the humeral neck resection guide. Using oscillating saw  we removed the head and used it for bone graft in the back table.  We then removed the bone spurs off the humeral neck and then went ahead and reamed up to a size 10 diaphyseal reaming and then punched for the size 10 stem and then broached first size 8, then size 10, again and set on about 10-15 degrees retroversion.  Next, we went ahead and left our trial humeral component in place, retracted the humerus posteriorly, did a  360-degree glenoid labrum removal and then capsular removal, freed up subscap so it balanced nicely and was in very good condition.  Placed our retractors.  We had 360 degree exposure of glenoid, removed minimal remaining cartilage from that, it was basically full worn.  There was just some granulation tissue present on the face of the glenoid.  No significant deformity was noted.  There was some significant cyst.  We found our center point with our pen for 44 glenoid, reamed for the 44 first by power  and then by hand.  We then went ahead and drilled our central PEG hole.  Finding some cyst, we removed that with rongeur.  We then placed our peripheral guide and drilled our superior to inferior PEG holes.  Once we had this drilled, we dried those peripheral holes with Gelfoam soaked in thrombin.  We mixed our cement on the back table and then using vacuum mixing technique and pressurized and injected cement into the 3 peripheral holes.  We impacted our anchor PEG glenoid in place, allowed the cement to harden in back table.  We then went ahead and trialed with a 44 x 18 eccentric head.  We are happy with soft tissue balancing.  We removed the trial components on the humeral side.  We thoroughly irrigated the canal.  We placed the drill holes for repair of the subscap and placed #2 FiberWire suture in that.  We then went ahead and using impaction grafting technique and graft on the head and impacted the real press-fit stem, global advantage to a size 10 stem in place.  Once that was in place, we went ahead and resected the remaining biceps tissue and did a tenodesis.  We next went ahead and impacted our real 44 x 18 eccentric with the eccentricity doubt [place] and little posteriorly to give the best coverage over the humeral head.  We then reduced the shoulder, thoroughly irrigated, and then repaired the subscap anatomically with the sutures through bone and then rotator interval sutures as well.  We had an anatomic repair not under tension with good mobility, nice stability through irrigation of the deltopectoral interval with repair of the deltopectoral interval with 0 Vicryl suture followed by 2-0 Vicryl subcutaneous closure, and 4-0 Monocryl for skin.  Steri-Strips applied followed by sterile dressing.  The patient tolerated the surgery well.     Almedia Balls. Ranell Patrick, M.D.     SRN/MEDQ  D:  07/15/2012  T:  07/15/2012  Job:  [id number]

## 2012-07-16 NOTE — Evaluation (Signed)
Occupational Therapy Evaluation Patient Details Name: Connie Haynes MRN: [id number] DOB: December 01, 1955 Today's Date: 07/16/2012 Time: 4098-1191 OT Time Calculation (min): 45 min  OT Assessment / Plan / Recommendation Clinical Impression  Pt is a 57 yr old female admitted with left shoulder end stage osteoarthritis.  Underwent left TSA by Dr. Ranell Patrick.  Pt and husband educated on positioning, ADLs, AROM, and AAROM exercises for the LUE.  No further acute OT needs.  Recommend HHOT for progression of rehab on the left shoulder.    OT Assessment  All further OT needs can be met in the next venue of care    Follow Up Recommendations  Home health OT       Equipment Recommendations  None recommended by OT          Precautions / Restrictions Precautions Precautions: Shoulder Type of Shoulder Precautions: elbow wrist and hand AROM OK, AAROM for flexion to 90 degrees, and external rotation to 30 degrees. Precaution Booklet Issued: Yes (comment) Required Braces or Orthoses: Other Brace/Splint Other Brace/Splint: sling for mobility and comfort Restrictions Weight Bearing Restrictions: Yes LUE Weight Bearing: Non weight bearing   Pertinent Vitals/Pain Minimal pain reported in the left shoulder during rest, increases with AAROM exercises but no number stated.    ADL  Eating/Feeding: Simulated;Modified independent Grooming: Simulated;Supervision/safety Where Assessed - Grooming: Unsupported standing Upper Body Bathing: Simulated;Minimal assistance Where Assessed - Upper Body Bathing: Supported sit to stand Lower Body Bathing: Simulated;Minimal assistance Where Assessed - Lower Body Bathing: Supported sit to stand Upper Body Dressing: Performed;Moderate assistance Where Assessed - Upper Body Dressing: Unsupported sitting Lower Body Dressing: Performed;Minimal assistance Where Assessed - Lower Body Dressing: Supported sit to stand Toilet Transfer: Simulated;Modified independent Toilet Transfer  Method: Other (comment) (ambulate without assistive device) Toilet Transfer Equipment: Comfort height toilet Toileting - Clothing Manipulation and Hygiene: Simulated;Minimal assistance Where Assessed - Toileting Clothing Manipulation and Hygiene: Sit to stand from 3-in-1 or toilet Tub/Shower Transfer Method: Not assessed Equipment Used: Other (comment) (sling) Transfers/Ambulation Related to ADLs: Pt is independent for mobility at this time. ADL Comments: Pt and husband educated on ADLS, sling proper positioning, AAROM exercises for flexion and external rotation, and AROM exercises for the non-involved joints.  No further acute OT needs.  Feel pt will benefit from [facility] to continue progression of LUE AROM and strengthening.     OT Problem List: Decreased strength;Decreased range of motion;Pain     Visit Information  Last OT Received On: 07/16/12 Assistance Needed: +1    Subjective Data  Subjective: I finally go some pain medicine so I'm doing good. Patient Stated Goal: to get her shoulder better.   Prior Functioning     Home Living Lives With: Spouse Prior Function Level of Independence: Independent Able to Take Stairs?: Yes Driving: Yes Communication Communication: No difficulties Dominant Hand: Right         Vision/Perception Vision - History Baseline Vision: No visual deficits Patient Visual Report: No change from baseline Vision - Assessment Eye Alignment: Within Functional Limits Vision Assessment: Vision not tested Perception Perception: Within Functional Limits Praxis Praxis: Intact   Cognition  Cognition Arousal/Alertness: Awake/alert Behavior During Therapy: WFL for tasks assessed/performed Overall Cognitive Status: Within Functional Limits for tasks assessed    Extremity/Trunk Assessment Right Upper Extremity Assessment RUE ROM/Strength/Tone: Within functional levels RUE Sensation: WFL - Light Touch RUE Coordination: WFL - gross/fine motor Left  Upper Extremity Assessment LUE ROM/Strength/Tone: Deficits;Unable to fully assess;Due to precautions LUE ROM/Strength/Tone Deficits: Pt able to demonstrate  full AROM elbow flexion and extension as well as grip.  AAROM able to tolerate 0-90 degrees shoulder flexion and 0-30 degrees external rotation. LUE Sensation: WFL - Light Touch LUE Coordination: WFL - gross/fine motor Trunk Assessment Trunk Assessment: Normal     Mobility Bed Mobility Bed Mobility: Supine to Sit Supine to Sit: 6: Modified independent (Device/Increase time);HOB elevated Transfers Transfers: Sit to Stand;Stand to Sit Sit to Stand: 7: Independent;With upper extremity assist;From bed Stand to Sit: 7: Independent;With upper extremity assist;To bed     Exercise Shoulder Exercises Pendulum Exercise: PROM;Left;Standing;15 reps Shoulder Flexion: AAROM;Left;15 reps;Supine Shoulder External Rotation: AAROM;Left;Supine Elbow Flexion: AROM;5 reps;Standing;Left Digit Composite Flexion: AROM;5 reps;Seated;Left Donning/doffing shirt without moving shoulder: Caregiver independent with task;Independent Method for sponge bathing under operated UE: Caregiver independent with task;Independent Donning/doffing sling/immobilizer: Caregiver independent with task;Minimal assistance Correct positioning of sling/immobilizer: Independent;Caregiver independent with task Pendulum exercises (written home exercise program): Independent ROM for elbow, wrist and digits of operated UE: Independent Sling wearing schedule (on at all times/off for ADL's): Independent Proper positioning of operated UE when showering: Independent Dressing change:  (Nursing to address) Positioning of UE while sleeping: Independent   Balance Balance Balance Assessed: Yes Static Standing Balance Static Standing - Balance Support: No upper extremity supported Static Standing - Level of Assistance: 7: Independent   End of Session OT - End of Session Activity  Tolerance: Patient tolerated treatment well Patient left: in bed;with call bell/phone within reach;with family/visitor present Nurse Communication: Mobility status     Dayona Shaheen OTR/L Pager number [phone] 07/16/2012, 3:18 PM

## 2012-07-16 NOTE — Progress Notes (Signed)
Orthopedic Tech Progress Note Patient Details:  Connie Haynes 08/26/1955 [id number] OHF applied to bed Patient ID: [name], female   DOB: [birth year], 57 y.o.   MRN: [id number]   [name] 07/16/2012, 10:07 AM

## 2012-07-16 NOTE — Assessment & Plan Note (Signed)
She was given a prescription for Tessalon Perles.   

## 2012-07-16 NOTE — Progress Notes (Signed)
Orthopedics Progress Note  Subjective: I am hurting a lot  Objective:  Filed Vitals:   07/16/12 0429  BP: 124/57  Pulse: 68  Temp: 98.8 F (37.1 C)  Resp: 18    General: Awake and alert  Musculoskeletal: left shoulder dressing clean and dry Neurovascularly intact  Lab Results  Component Value Date   WBC 9.2 07/07/2012   HGB 11.0* 07/16/2012   HCT 32.8* 07/16/2012   MCV 90.1 07/07/2012   PLT 310 07/07/2012       Component Value Date/Time   NA 138 07/16/2012 0510   K 4.1 07/16/2012 0510   CL 104 07/16/2012 0510   CO2 24 07/16/2012 0510   GLUCOSE 127* 07/16/2012 0510   BUN 13 07/16/2012 0510   CREATININE 0.79 07/16/2012 0510   CREATININE 0.82 05/23/2012 1601   CALCIUM 8.8 07/16/2012 0510   GFRNONAA 89* 07/16/2012 0510   GFRAA >90 07/16/2012 0510    Lab Results  Component Value Date   INR 0.9 07/27/2007   INR 0.9 12/31/2006    Assessment/Plan: POD #1 s/p Procedure(s): LEFT TOTAL SHOULDER ARTHROPLASTY Doing well this morning. Pain control Likely D/C tomorrow Will need home health OT/PT for her shoulder for AAROM, ADLs, minimal WB on the left arm  Almedia Balls. Ranell Patrick, MD 07/16/2012 9:06 AM

## 2012-07-17 MED ORDER — OXYCODONE-ACETAMINOPHEN 5-325 MG PO TABS: 1.0000 | ORAL_TABLET | ORAL | Status: DC | PRN

## 2012-07-17 MED ORDER — BISACODYL 10 MG RE SUPP
10.0000 mg | Freq: Once | RECTAL | Status: AC
  Administered 2012-07-17: 10 mg via RECTAL
  Filled 2012-07-17: qty 1

## 2012-07-17 MED ORDER — METHOCARBAMOL 500 MG PO TABS: 500.0000 mg | ORAL_TABLET | Freq: Three times a day (TID) | ORAL | Status: DC | PRN

## 2012-07-17 NOTE — Progress Notes (Signed)
Orthopedics Progress Note  Subjective: My stomach aches  Objective:  Filed Vitals:   07/17/12 0730  BP: 116/59  Pulse: 87  Temp: 98.9 F (37.2 C)  Resp: 18    General: Awake and alert  Musculoskeletal: left shoulder incision CDI, no erythema, mod swelling in the arm Neurovascularly intact  Lab Results  Component Value Date   WBC 9.2 07/07/2012   HGB 11.0* 07/16/2012   HCT 32.8* 07/16/2012   MCV 90.1 07/07/2012   PLT 310 07/07/2012       Component Value Date/Time   NA 138 07/16/2012 0510   K 4.1 07/16/2012 0510   CL 104 07/16/2012 0510   CO2 24 07/16/2012 0510   GLUCOSE 127* 07/16/2012 0510   BUN 13 07/16/2012 0510   CREATININE 0.79 07/16/2012 0510   CREATININE 0.82 05/23/2012 1601   CALCIUM 8.8 07/16/2012 0510   GFRNONAA 89* 07/16/2012 0510   GFRAA >90 07/16/2012 0510    Lab Results  Component Value Date   INR 0.9 07/27/2007   INR 0.9 12/31/2006    Assessment/Plan: POD #2 s/p Procedure(s): LEFT TOTAL SHOULDER ARTHROPLASTY Stomach distress this AM, no BM yet, just gas. Will recommend a suppository and try to have a good BM prior to her d/c this afternoon If stomach continues to bother her, then will keep her overnight.  Almedia Balls. Ranell Patrick, MD 07/17/2012 10:17 AM

## 2012-07-17 NOTE — Progress Notes (Signed)
   CARE MANAGEMENT NOTE 07/17/2012  Patient:  Connie Haynes, Connie Haynes   Account Number:  [id number]  Date Initiated:  07/17/2012  Documentation initiated by:  [facility]  Subjective/Objective Assessment:   LEFT TOTAL SHOULDER ARTHROPLASTY on 07/15/2012     Action/Plan:   [facility] PT/OT   Anticipated DC Date:  07/17/2012   Anticipated DC Plan:  HOME W HOME HEALTH SERVICES      DC Planning Services  CM consult      Choice offered to / List presented to:          [facility] arranged  HH-2 PT  HH-3 OT      [facility] agency  [facility]   Status of service:  Completed, signed off Medicare Important Message given?   (If response is "NO", the following Medicare IM given date fields will be blank) Date Medicare IM given:   Date Additional Medicare IM given:    Discharge Disposition:  HOME W HOME HEALTH SERVICES  Per UR Regulation:    If discussed at Long Length of Stay Meetings, dates discussed:    Comments:  07/17/2012 1530 Connie Haynes is aware of scheduled dc home today with HH. Isidoro Donning RN CCM Case Mgmt phone [phone]

## 2012-07-17 NOTE — Discharge Summary (Signed)
Physician Discharge Summary   Patient ID: Connie Haynes MRN: [id number] DOB/AGE: 1955-10-27 57 y.o.  Admit date: 07/15/2012 Discharge date: 07/17/2012  Admission Diagnoses:  Left shoulder OA, end stage  Discharge Diagnoses:  Same   Surgeries: Procedure(s): LEFT TOTAL SHOULDER ARTHROPLASTY on 07/15/2012   Consultants: OT Discharged Condition: Stable  Hospital Course: Connie Haynes is an 57 y.o. female who was admitted 07/15/2012 with a chief complaint of left shoulder pain and lack of function, and found to have a diagnosis of left shoulder OA.  They were brought to the operating room on 07/15/2012 and underwent the above named procedures.    The patient had an uncomplicated hospital course and was stable for discharge.  Recent vital signs:  Filed Vitals:   07/17/12 0730  BP: 116/59  Pulse: 87  Temp: 98.9 F (37.2 C)  Resp: 18    Recent laboratory studies:  Results for orders placed during the hospital encounter of 07/15/12  HEMOGLOBIN AND HEMATOCRIT, BLOOD      Result Value Range   Hemoglobin 11.0 (*) 12.0 - 15.0 g/dL   HCT 78.2 (*) 95.6 - 21.3 %  BASIC METABOLIC PANEL      Result Value Range   Sodium 138  135 - 145 mEq/L   Potassium 4.1  3.5 - 5.1 mEq/L   Chloride 104  96 - 112 mEq/L   CO2 24  19 - 32 mEq/L   Glucose, Bld 127 (*) 70 - 99 mg/dL   BUN 13  6 - 23 mg/dL   Creatinine, Ser 0.86  0.50 - 1.10 mg/dL   Calcium 8.8  8.4 - 57.8 mg/dL   GFR calc non Af Amer 89 (*) >90 mL/min   GFR calc Af Amer >90  >90 mL/min    Discharge Medications:     Medication List    ASK your doctor about these medications       benzonatate 200 MG capsule  Commonly known as:  TESSALON  Take 1 capsule (200 mg total) by mouth 3 (three) times daily as needed for cough.     CALTRATE 600 PLUS-VIT D PO  Take 1 tablet by mouth daily.     clonazePAM 1 MG tablet  Commonly known as:  KLONOPIN  Take 1 tablet (1 mg total) by mouth at bedtime as needed for anxiety. Take 1 tab po q hs for  sleep     DULoxetine 30 MG capsule  Commonly known as:  CYMBALTA  Take 30 mg by mouth daily.     lubiprostone 24 MCG capsule  Commonly known as:  AMITIZA  Take 24 mcg by mouth 2 (two) times daily with a meal.     meloxicam 7.5 MG tablet  Commonly known as:  MOBIC  Take 7.5 mg by mouth daily.     multivitamin tablet  Take 1 tablet by mouth daily.     nebivolol 10 MG tablet  Commonly known as:  BYSTOLIC  Take 10 mg by mouth daily.     PRESCRIPTION MEDICATION  Apply 1 application topically 2 (two) times daily. IDCTG cream=  For knee (samples from doctor)     promethazine 25 MG tablet  Commonly known as:  PHENERGAN  Take 25 mg by mouth every 6 (six) hours as needed for nausea.     rOPINIRole 1 MG tablet  Commonly known as:  REQUIP  Take 1 mg by mouth at bedtime as needed (for restless leg).     traMADol 50 MG tablet  Commonly known as:  ULTRAM  Take 50 mg by mouth every 6 (six) hours as needed for pain.     Vitamin D 1000 UNITS capsule  Take 2,000 Units by mouth daily.        Diagnostic Studies: Dg Chest 2 View  07/07/2012   *RADIOLOGY REPORT*  Clinical Data: Preop for left shoulder surgery, on medication for hypertension  CHEST - 2 VIEW  Comparison: Chest x-ray of 02/16/2011 and CT chest of 07/16/2011  Findings: No active infiltrate or effusion is seen.  A probable bone island is noted within the left posterior fifth rib which appears stable.  This area does represent bone not when compared to the CT from 2013.  Mediastinal contours appear normal.  The heart is within normal limits in size.  An anterior cervical spine fusion plate is present.  IMPRESSION: No active lung disease.  Stable bone island in the posterior left fifth rib.   Original Report Authenticated By: Dwyane Dee, M.D.   Dg Shoulder Left Port  07/15/2012   *RADIOLOGY REPORT*  Clinical Data: Postop left shoulder replacement.  PORTABLE LEFT SHOULDER - 2+ VIEW  Comparison: None.  Findings: 1341 hours.  The patient  is status post left shoulder replacement.  No evidence for immediate hardware complications. Gas in the soft tissues is compatible with immediate postoperative state.  IMPRESSION: No evidence for immediate hardware complications.   Original Report Authenticated By: Kennith Center, M.D.    Disposition: 01-Home or Self Care        Follow-up Information   Follow up with Dyneisha Murchison,STEVEN R, MD. Call in 2 weeks. [phone])    Contact information:   [address] [place] [address] [phone]        Signed: Verlee Rossetti 07/17/2012, 10:21 AM

## 2012-07-19 ENCOUNTER — Encounter (HOSPITAL_COMMUNITY): Payer: Self-pay | Admitting: Orthopedic Surgery

## 2012-07-22 ENCOUNTER — Ambulatory Visit (INDEPENDENT_AMBULATORY_CARE_PROVIDER_SITE_OTHER): Payer: BC Managed Care – PPO | Admitting: Family Medicine

## 2012-07-22 ENCOUNTER — Encounter: Payer: Self-pay | Admitting: Family Medicine

## 2012-07-22 ENCOUNTER — Ambulatory Visit (HOSPITAL_BASED_OUTPATIENT_CLINIC_OR_DEPARTMENT_OTHER)
Admission: RE | Admit: 2012-07-22 | Discharge: 2012-07-22 | Disposition: A | Discharge status: Home / Self Care | Payer: BC Managed Care – PPO | Source: Ambulatory Visit | Attending: Family Medicine | Admitting: Family Medicine

## 2012-07-22 VITALS — BP 149/90 | HR 65

## 2012-07-22 DIAGNOSIS — M7989 Other specified soft tissue disorders: Secondary | ICD-10-CM

## 2012-07-22 DIAGNOSIS — R209 Unspecified disturbances of skin sensation: Secondary | ICD-10-CM | POA: Insufficient documentation

## 2012-07-22 DIAGNOSIS — I10 Essential (primary) hypertension: Secondary | ICD-10-CM

## 2012-07-22 MED ORDER — POTASSIUM CHLORIDE ER 10 MEQ PO TBCR: 10.0000 meq | EXTENDED_RELEASE_TABLET | Freq: Two times a day (BID) | ORAL | Status: DC

## 2012-07-22 MED ORDER — RIVAROXABAN 15 MG PO TABS: 15.0000 mg | ORAL_TABLET | Freq: Two times a day (BID) | ORAL | Status: DC

## 2012-07-22 MED ORDER — FUROSEMIDE 40 MG PO TABS: 40.0000 mg | ORAL_TABLET | Freq: Every day | ORAL | Status: DC

## 2012-07-22 MED ORDER — NEBIVOLOL HCL 10 MG PO TABS: 10.0000 mg | ORAL_TABLET | Freq: Every day | ORAL | Status: DC

## 2012-07-22 NOTE — Progress Notes (Signed)
  Subjective:    Patient ID: Connie Haynes, female    DOB: 1955-07-14, 57 y.o.   MRN: [id number]  HPI  Connie Haynes is here today complaining of pain and swelling in both legs.  She recently had surgery (shoulder replacement).  She has noticed some numbness on the outside of her right leg and has been having pian in this leg while doing her physical therapy.  She describes the pain as being mild to moderate in nature and she feels that she is worsening. She is concerned about having a blood clot in her legs.     Review of Systems  Constitutional:       Generalized Edema  Respiratory: Positive for cough (She thinks that her cough may be slightly better.  ).   Cardiovascular: Positive for leg swelling. Negative for palpitations.  Musculoskeletal:       Pain in legs R > L.    Neurological: Positive for numbness.       Right outer upper leg   Past Medical History  Diagnosis Date  . Wolff-Parkinson-White (WPW) syndrome   . Hypertension   . Osteoarthritis   . Hyperlipidemia   . IBS (irritable bowel syndrome)   . Kidney stones   . PONV (postoperative nausea and vomiting)     " woke up to soon"  . Cough    Family History  Problem Relation Age of Onset  . Leukemia Brother   . Heart disease Mother   . Heart disease Maternal Grandfather   . Hypertension Mother   . Hyperlipidemia Mother   . Cancer Paternal Grandfather   . 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 No distress.  Cardiovascular: Normal rate, regular rhythm and normal heart sounds.   Pulmonary/Chest: Effort normal  and breath sounds normal. She has no wheezes.  Musculoskeletal: She exhibits edema and tenderness.  Skin: Skin is warm and dry.          Assessment & Plan:

## 2012-08-16 NOTE — Progress Notes (Deleted)
  Subjective:    Patient ID: Connie Haynes, female    DOB: [birth year], 57 y.o.   MRN: [id number]  HPI    Review of Systems     Objective:   Physical Exam        Assessment & Plan:

## 2012-08-21 DIAGNOSIS — I10 Essential (primary) hypertension: Secondary | ICD-10-CM | POA: Insufficient documentation

## 2012-08-21 DIAGNOSIS — M7989 Other specified soft tissue disorders: Secondary | ICD-10-CM | POA: Insufficient documentation

## 2012-08-21 NOTE — Assessment & Plan Note (Deleted)
She thinks that her cough may be a little better off of the losartan so she was given a prescription for Bystolic.    

## 2012-08-21 NOTE — Assessment & Plan Note (Addendum)
Sent for a venous doppler study which was negative for DVT.  She was given a prescription for Lasix.

## 2012-08-21 NOTE — Assessment & Plan Note (Signed)
She thinks that her cough may be a little better off of the losartan so she was given a prescription for Bystolic.

## 2012-10-04 ENCOUNTER — Ambulatory Visit (INDEPENDENT_AMBULATORY_CARE_PROVIDER_SITE_OTHER): Payer: BC Managed Care – PPO | Admitting: Family Medicine

## 2012-10-04 ENCOUNTER — Encounter: Payer: Self-pay | Admitting: Family Medicine

## 2012-10-04 VITALS — BP 140/86 | HR 77

## 2012-10-04 DIAGNOSIS — Z23 Encounter for immunization: Secondary | ICD-10-CM | POA: Insufficient documentation

## 2012-10-04 DIAGNOSIS — M154 Erosive (osteo)arthritis: Secondary | ICD-10-CM | POA: Insufficient documentation

## 2012-10-04 DIAGNOSIS — R059 Cough, unspecified: Secondary | ICD-10-CM

## 2012-10-04 DIAGNOSIS — R05 Cough: Secondary | ICD-10-CM

## 2012-10-04 DIAGNOSIS — R053 Chronic cough: Secondary | ICD-10-CM | POA: Insufficient documentation

## 2012-10-04 DIAGNOSIS — M159 Polyosteoarthritis, unspecified: Secondary | ICD-10-CM

## 2012-10-04 NOTE — Patient Instructions (Addendum)
1)  Osteoarthritis:    You have an appointment with Dr. Radonna Ricker on 12-13-12 at 12:40 at [facility].

## 2012-10-04 NOTE — Assessment & Plan Note (Signed)
The patient confirmed that they are not allergic to eggs and have never had a bad reaction with the flu shot in the past.  The vaccination was given without difficulty.   

## 2012-10-04 NOTE — Progress Notes (Signed)
Subjective:    Patient ID: Connie Haynes, female    DOB: [birth year], 57 y.o.   MRN: [id number]  HPI  Connie Haynes is here today to discuss getting a referral to a rheumatologist at [facility] (Dr. Radonna Ricker).  He was recommended to her by her physical therapist's brother who is from [facility].  She would like to see a rheumatologist for a couple of reasons.  First, Dr. Charlann Boxer (her orthopedist) wants her to see one to see if they can shed some light/figure out why her cartilage is deteriorating.  She has had several surgeries and needs several more and Dr. Charlann Boxer is really interested in why this is happening to [facility].   She has also had a chronic cough that continues to annoy/worry her.  She has been evaluated several times by a pulmonologist and has been told that her lungs are fine but no real explanation can be given for this chronic cough.  She is worried that whatever is going on in her joints may also be affecting her lungs. She had surgery on her left shoulder with Dr. Ranell Patrick on 07/15/12.  It was noted that her O2 level was very low when she was in recovery which concerned her.   Review of Systems  Constitutional: Negative.   HENT: Negative.   Eyes: Negative.   Respiratory: Positive for apnea, cough and shortness of breath.   Cardiovascular: Negative.   Gastrointestinal: Negative.   Endocrine: Negative.   Genitourinary: Negative.   Musculoskeletal: Positive for myalgias, back pain, joint swelling and arthralgias.  Allergic/Immunologic: Negative.   Neurological: Negative.   Hematological: Negative.   Psychiatric/Behavioral: Negative.     Past Medical History  Diagnosis Date  . Wolff-Parkinson-White (WPW) syndrome   . Hypertension   . Osteoarthritis   . Hyperlipidemia   . IBS (irritable bowel syndrome)   . Kidney stones   . PONV (postoperative nausea and vomiting)     " woke up to soon"  . Cough     Family History  Problem Relation Age of Onset  . Leukemia Brother   . Heart disease Mother    . Heart disease Maternal Grandfather   . Hypertension Mother   . Hyperlipidemia Mother   . Cancer Paternal Grandfather   . 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 No distress.  HENT:  Head: Normocephalic.  Eyes: No scleral icterus.  Neck: No thyromegaly present.  Cardiovascular: Normal rate, regular rhythm and normal heart sounds.   Pulmonary/Chest: Effort normal and breath sounds normal.  Abdominal: There is no tenderness.  Musculoskeletal: She exhibits tenderness (She has pain alll over her body.  She is unable to lift her left arm.  ). She exhibits no edema.  Neurological: She is alert.  Skin: Skin is warm and dry.  Psychiatric: She has a normal mood and affect. Her behavior is normal. Judgment and thought content normal.  Assessment & Plan:

## 2012-10-04 NOTE — Assessment & Plan Note (Addendum)
Sending to [facility] for a thorough evaluation.  She was given a copy of the arthritis panel we did in the spring.  She has an appointment with Dr. Radonna Ricker on 12/13/12.

## 2012-10-04 NOTE — Assessment & Plan Note (Addendum)
Her cough remains despite several attempts to make it go away.  We'll see if Dr. Radonna Ricker can help figure out what is going on.

## 2012-11-24 ENCOUNTER — Encounter: Payer: Self-pay | Admitting: *Deleted

## 2012-11-24 LAB — HM MAMMOGRAPHY: HM Mammogram: NORMAL

## 2012-12-29 ENCOUNTER — Encounter: Payer: Self-pay | Admitting: Family Medicine

## 2012-12-29 ENCOUNTER — Ambulatory Visit (INDEPENDENT_AMBULATORY_CARE_PROVIDER_SITE_OTHER): Payer: BC Managed Care – PPO | Admitting: Family Medicine

## 2012-12-29 VITALS — BP 152/100 | HR 73 | Resp 16 | Ht 65.0 in | Wt 245.0 lb

## 2012-12-29 DIAGNOSIS — R110 Nausea: Secondary | ICD-10-CM

## 2012-12-29 DIAGNOSIS — F411 Generalized anxiety disorder: Secondary | ICD-10-CM

## 2012-12-29 DIAGNOSIS — I10 Essential (primary) hypertension: Secondary | ICD-10-CM

## 2012-12-29 DIAGNOSIS — F3289 Other specified depressive episodes: Secondary | ICD-10-CM

## 2012-12-29 DIAGNOSIS — G894 Chronic pain syndrome: Secondary | ICD-10-CM

## 2012-12-29 DIAGNOSIS — F329 Major depressive disorder, single episode, unspecified: Secondary | ICD-10-CM

## 2012-12-29 MED ORDER — BUPROPION HCL ER (XL) 150 MG PO TB24: 150.0000 mg | ORAL_TABLET | ORAL | Status: DC

## 2012-12-29 MED ORDER — CLONAZEPAM 1 MG PO TABS: 1.0000 mg | ORAL_TABLET | Freq: Every evening | ORAL | Status: DC | PRN

## 2012-12-29 MED ORDER — PREDNISONE 10 MG PO KIT: PACK | ORAL | Status: DC

## 2012-12-29 MED ORDER — ESCITALOPRAM OXALATE 5 MG PO TABS: 5.0000 mg | ORAL_TABLET | Freq: Every day | ORAL | Status: DC

## 2012-12-29 MED ORDER — CELECOXIB 200 MG PO CAPS: 200.0000 mg | ORAL_CAPSULE | Freq: Every day | ORAL | Status: DC

## 2012-12-29 MED ORDER — PROMETHAZINE HCL 25 MG PO TABS: 25.0000 mg | ORAL_TABLET | Freq: Four times a day (QID) | ORAL | Status: DC | PRN

## 2012-12-29 MED ORDER — TRAMADOL HCL 50 MG PO TABS: 50.0000 mg | ORAL_TABLET | Freq: Two times a day (BID) | ORAL | Status: DC | PRN

## 2012-12-29 MED ORDER — LOSARTAN POTASSIUM-HCTZ 100-12.5 MG PO TABS: 1.0000 | ORAL_TABLET | Freq: Every day | ORAL | Status: DC

## 2012-12-29 NOTE — Progress Notes (Signed)
Subjective:    Patient ID: Connie Haynes, female    DOB: [birth year], 57 y.o.   MRN: [id number]  HPI  Skyra is here today to discuss the conditions listed below:     1)  Joint Pain:  She was recently seen at [facility] by a rheumatologist named Dr. Radonna Ricker.  She feels that she really did not get much from her visit with him.  She says that basically she was told that she just has severe osteoarthritis but was not given any reason as to why her cartilage has been destroyed so quickly.  She says that Dr. Radonna Ricker noted that she has all the classic symptoms of Lupus but she was not given that diagnosis because her lab tests did not support the diagnoses. She was not offered any medications to manage her pain.  She controls her pain with Celebrex, Klonopin and Tramadol.  She needs these medications refilled.    2)  Hypertension:  She needs a refill on her Losartan.  She continues taking this medication daily but she feels that her blood pressure is so uncontrolled due to the severe pain she has every day.    3)  Anxiety/Depression:  She brought a letter (2 pages long) explaining her frustration, anxiety and depression that she is facing with her many medical conditions.  She took Cymbalta for a short period of time but she did not see any improvement in her symptoms and stopped taking it.      Review of Systems  Constitutional: Negative.   HENT: Negative.   Eyes: Negative.   Respiratory: Negative.   Cardiovascular: Negative.   Gastrointestinal: Negative.   Endocrine: Negative.   Genitourinary: Negative.   Musculoskeletal: Positive for arthralgias.       Shoulders, knees and feet joints.   Allergic/Immunologic: Negative.   Neurological: Negative.   Hematological: Negative.   Psychiatric/Behavioral: Negative.      Past Medical History  Diagnosis Date  . Wolff-Parkinson-White (WPW) syndrome   . Hypertension   . Osteoarthritis   . Hyperlipidemia   . IBS (irritable bowel syndrome)   . Kidney  stones   . PONV (postoperative nausea and vomiting)     " woke up to soon"  . Cough      Family History  Problem Relation Age of Onset  . Leukemia Brother   . Heart disease Mother   . Heart disease Maternal Grandfather   . Hypertension Mother   . Hyperlipidemia Mother   . Cancer Paternal Grandfather   . 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 Constitutional: She is oriented to person, place, and time. She appears well-developed and well-nourished.  Cardiovascular: Normal rate and regular rhythm.   Pulmonary/Chest: Effort normal and breath sounds normal.  Neurological: She is alert and oriented to person, place, and time.  Skin: Skin is warm and dry.  Psychiatric: Her behavior is normal. Judgment and thought content normal.  She is very  depressed/hopeless at today's visit.        Assessment & Plan:    Susen was seen today for medication management.  Diagnoses and associated orders for this visit:  Chronic pain syndrome - celecoxib (CELEBREX) 200 MG capsule; Take 1 capsule (200 mg total) by mouth daily. - traMADol (ULTRAM) 50 MG tablet; Take 1 tablet (50 mg total) by mouth every 12 (twelve) hours as needed. - PredniSONE 10 MG KIT; Take the dosepak as directed for 12 days  Depressive disorder, not elsewhere classified  -     buPROPion (WELLBUTRIN XL) 150 MG 24 hr tablet; Take 1 tablet (150 mg total) by mouth every morning. - escitalopram (LEXAPRO) 5 MG tablet; Take 1 tablet (5 mg total) by mouth daily.   Anxiety state, unspecified - clonazePAM (KLONOPIN) 1 MG tablet; Take 1  tablet (1 mg total) by mouth at bedtime as needed for anxiety. Take 1 tab po q hs for sleep  Essential hypertension, benign - losartan-hydrochlorothiazide (HYZAAR) 100-12.5 MG per tablet; Take 1 tablet by mouth daily.  Nausea alone - promethazine (PHENERGAN) 25 MG tablet; Take 1 tablet (25 mg total) by mouth every 6 (six) hours as needed for nausea.    Jevon is having a very hard time.  She has significant pain and wants to have some answers.  She was hopeful that her visit to [facility] would provide some explanation as to why her cartilage is being destroyed.  I read Dr. Jaquelyn Bitter notes and did not get any answers.  Sharicka always feels better when she takes a course of steroids so she was given a dosepak.  She is very depressed so we're going to try her on a combination of Wellbutrin and Lexapro.    TIME SPENT "FACE TO FACE" WITH PATIENT - 60 MINS

## 2012-12-29 NOTE — Patient Instructions (Signed)
1)  Mood - Start on 5 mg of Lexapro at night and 150 mg of Wellbutrin XL in the am.  Call [facility] and Dr. Carie Caddy office for an appointment.   2)  Pain - Take the Prednisone Dose Pak for 12 days.   3)  Weight - Get Dr. Nilsa Nutting opinion about gastric surgery.

## 2013-02-09 DIAGNOSIS — Z87440 Personal history of urinary (tract) infections: Secondary | ICD-10-CM

## 2013-02-09 HISTORY — DX: Personal history of urinary (tract) infections: Z87.440

## 2013-02-13 ENCOUNTER — Encounter: Payer: Self-pay | Admitting: *Deleted

## 2013-02-20 DIAGNOSIS — H02059 Trichiasis without entropian unspecified eye, unspecified eyelid: Secondary | ICD-10-CM | POA: Insufficient documentation

## 2013-02-20 DIAGNOSIS — H02139 Senile ectropion of unspecified eye, unspecified eyelid: Secondary | ICD-10-CM | POA: Insufficient documentation

## 2013-02-20 DIAGNOSIS — IMO0001 Reserved for inherently not codable concepts without codable children: Secondary | ICD-10-CM | POA: Insufficient documentation

## 2013-03-03 ENCOUNTER — Encounter: Payer: Self-pay | Admitting: Family Medicine

## 2013-03-03 ENCOUNTER — Ambulatory Visit (INDEPENDENT_AMBULATORY_CARE_PROVIDER_SITE_OTHER): Payer: BC Managed Care – PPO | Admitting: Family Medicine

## 2013-03-03 DIAGNOSIS — G894 Chronic pain syndrome: Secondary | ICD-10-CM

## 2013-03-03 DIAGNOSIS — F411 Generalized anxiety disorder: Secondary | ICD-10-CM

## 2013-03-03 MED ORDER — DULOXETINE HCL 30 MG PO CPEP: 30.0000 mg | ORAL_CAPSULE | Freq: Every day | ORAL | Status: DC

## 2013-03-03 MED ORDER — ESCITALOPRAM OXALATE 5 MG PO TABS: 5.0000 mg | ORAL_TABLET | Freq: Every day | ORAL | Status: DC

## 2013-03-03 MED ORDER — DULOXETINE HCL 60 MG PO CPEP: 60.0000 mg | ORAL_CAPSULE | Freq: Every day | ORAL | Status: DC

## 2013-03-03 MED ORDER — DULOXETINE HCL 30 MG PO CPEP: ORAL_CAPSULE | ORAL | Status: DC

## 2013-03-03 NOTE — Patient Instructions (Addendum)
1)  Mood -   Plan A - Cymbalta 30 mg for 7 days then 60 mg  Plan B - Cymbalta 30 mg and Lexapro 5 mg   2)  Rheumatologist - ? Autoimmune Disease to cartilage?; How low of prednisone could you do chronically to improve quality of life.

## 2013-03-03 NOTE — Progress Notes (Signed)
Subjective:    Patient ID: Connie Haynes, female    DOB: [birth year], 58 y.o.   MRN: [id number]  HPI  Connie Haynes is here today to follow up on her medications. She started taking Lexapro and Wellbutrin last month. She did not like the Welblutrin so she stopped it about 2 weeks into it. She feels that she did pretty well on the Lexapro but likes how Cymbalta helped both her mood and pain.  She thinks that the Lexapro does not last the entire day.  She is wondering if the Lexapro dosage should be increased or if she should switch back to the Cymbalta.   Review of Systems  Constitutional: Positive for fatigue.  Psychiatric/Behavioral: The patient is nervous/anxious.        She feels that the Lexapro stops helping in the evening hours  All other systems reviewed and are negative.    Past Medical History  Diagnosis Date  . Wolff-Parkinson-White (WPW) syndrome   . Hypertension   . Osteoarthritis   . Hyperlipidemia   . IBS (irritable bowel syndrome)   . Kidney stones   . PONV (postoperative nausea and vomiting)     " woke up to soon"  . Cough   . Depression   . Disorder of tear duct system      Past Surgical History  Procedure Laterality Date  . Tear duct surgery    . Cervical spine surgery    . Total abdominal hysterectomy    . Total hip arthroplasty      x 3  . Colon surgery    . Ablation surgery      1993 [facility] (Dr. Hulen Skains)  . Eyelid carcinoma excision    . Joint replacement    . Cervical spine surgery    . 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 Leukemia Brother   . Heart disease Mother   . Hypertension Mother   . Hyperlipidemia Mother   . Heart disease Maternal Grandfather   . Cancer Paternal Grandfather   . Lung cancer Paternal Grandfather   . Cancer Maternal Uncle     bone     Current Outpatient Prescriptions on File Prior to Visit  Medication Sig Dispense Refill  . Calcium-Vitamin D (CALTRATE 600 PLUS-VIT D PO) Take 1 tablet by mouth daily.      . celecoxib (CELEBREX) 200 MG capsule Take 1 capsule (200 mg total) by mouth daily.  60 capsule  11  . Cholecalciferol (VITAMIN D) 1000 UNITS capsule Take 2,000 Units by mouth daily.      . clonazePAM (KLONOPIN) 1 MG tablet Take 1 tablet (1 mg total) by mouth at bedtime as needed for anxiety. Take 1 tab po q hs for sleep  30 tablet  0  . furosemide (LASIX) 40 MG tablet Take 1 tablet (  40 mg total) by mouth daily.  90 tablet  3  . losartan-hydrochlorothiazide (HYZAAR) 100-12.5 MG per tablet Take 1 tablet by mouth daily.  30 tablet  11  . lubiprostone (AMITIZA) 24 MCG capsule Take 24 mcg by mouth 2 (two) times daily with a meal.      . methocarbamol (ROBAXIN) 500 MG tablet Take 1 tablet (500 mg total) by mouth 3 (three) times daily as needed.  60 tablet  1  . Multiple Vitamin (MULTIVITAMIN) tablet Take 1 tablet by mouth daily.      . potassium chloride (K-DUR) 10 MEQ tablet Take 1 tablet (10 mEq total) by mouth 2 (two) times daily.  60 tablet  11  . PRESCRIPTION MEDICATION Apply 1 application topically 2 (two) times daily. IDCTG cream=  For knee (samples from doctor)      . promethazine (PHENERGAN) 25 MG tablet Take 1 tablet (25 mg total) by mouth every 6 (six) hours as needed for nausea.  60 tablet  5  . rOPINIRole (REQUIP) 1 MG tablet Take 1 mg by mouth at bedtime as needed (for restless leg).      . traMADol (ULTRAM) 50 MG tablet Take 1 tablet (50 mg total) by  mouth every 12 (twelve) hours as needed.  60 tablet  3   No current facility-administered medications on file prior to visit.     Allergies  Allergen Reactions  . Codeine     vomiting     Immunization History  Administered Date(s) Administered  . Influenza Whole 11/10/2010  . Influenza,inj,Quad PF,36+ Mos 10/04/2012  . Tdap 05/06/2005        Objective:   Physical Exam  Nursing note and vitals reviewed. Constitutional: She is oriented to person, place, and time. She appears well-developed and well-nourished.  Cardiovascular: Normal rate and regular rhythm.   Pulmonary/Chest: Effort normal and breath sounds normal.  Neurological: She is alert and oriented to person, place, and time.  Skin: Skin is warm and dry.  Psychiatric: She has a normal mood and affect.      Assessment & Plan:    Colinda was seen today for medication management.  Diagnoses and associated orders for this visit:  Anxiety state, unspecified Comments: She is going to start with Cymbalta and made add some Lexapro if needed.   - escitalopram (LEXAPRO) 5 MG tablet; Take 1 tablet (5 mg total) by mouth daily.  Chronic pain syndrome Comments: She will start on 30 mg of Cymbalta then increase to 60 mg.   - DULoxetine (CYMBALTA) 30 MG capsule; Take 1 capsule daily for 7 days then increase to 2 capsules daily - DULoxetine (CYMBALTA) 60 MG capsule; Take 1 capsule (60 mg total) by mouth daily. - DULoxetine (CYMBALTA) 30 MG capsule; Take 1 capsule (30 mg total) by mouth daily.

## 2013-03-04 DIAGNOSIS — F329 Major depressive disorder, single episode, unspecified: Secondary | ICD-10-CM | POA: Insufficient documentation

## 2013-03-04 DIAGNOSIS — F3289 Other specified depressive episodes: Secondary | ICD-10-CM | POA: Insufficient documentation

## 2013-03-04 DIAGNOSIS — G894 Chronic pain syndrome: Secondary | ICD-10-CM | POA: Insufficient documentation

## 2013-03-09 DIAGNOSIS — H04229 Epiphora due to insufficient drainage, unspecified lacrimal gland: Secondary | ICD-10-CM | POA: Insufficient documentation

## 2013-03-25 DIAGNOSIS — H04559 Acquired stenosis of unspecified nasolacrimal duct: Secondary | ICD-10-CM | POA: Insufficient documentation

## 2013-04-22 ENCOUNTER — Other Ambulatory Visit: Payer: Self-pay | Admitting: Family Medicine

## 2013-05-01 DIAGNOSIS — H04122 Dry eye syndrome of left lacrimal gland: Secondary | ICD-10-CM | POA: Insufficient documentation

## 2013-06-01 DIAGNOSIS — H019 Unspecified inflammation of eyelid: Secondary | ICD-10-CM | POA: Insufficient documentation

## 2013-07-25 NOTE — Progress Notes (Signed)
[place], [place]  - Please enter preop orders in Epic for Connie Haynes - surgery is 6/30 and she is coming for preop at [facility]  On Friday  6/19.  Thanks.

## 2013-07-26 ENCOUNTER — Encounter (HOSPITAL_COMMUNITY): Payer: Self-pay | Admitting: Pharmacy Technician

## 2013-07-28 ENCOUNTER — Encounter (HOSPITAL_COMMUNITY): Payer: Self-pay

## 2013-07-28 ENCOUNTER — Encounter (HOSPITAL_COMMUNITY)
Admission: RE | Admit: 2013-07-28 | Discharge: 2013-07-28 | Disposition: A | Discharge status: Home / Self Care | Payer: BC Managed Care – PPO | Source: Ambulatory Visit | Attending: Orthopedic Surgery | Admitting: Orthopedic Surgery

## 2013-07-28 ENCOUNTER — Ambulatory Visit (HOSPITAL_COMMUNITY)
Admission: RE | Admit: 2013-07-28 | Discharge: 2013-07-28 | Disposition: A | Discharge status: Home / Self Care | Payer: BC Managed Care – PPO | Source: Ambulatory Visit | Attending: Orthopedic Surgery | Admitting: Orthopedic Surgery

## 2013-07-28 ENCOUNTER — Encounter (INDEPENDENT_AMBULATORY_CARE_PROVIDER_SITE_OTHER): Payer: Self-pay

## 2013-07-28 DIAGNOSIS — Z01812 Encounter for preprocedural laboratory examination: Secondary | ICD-10-CM | POA: Insufficient documentation

## 2013-07-28 DIAGNOSIS — Z96619 Presence of unspecified artificial shoulder joint: Secondary | ICD-10-CM | POA: Insufficient documentation

## 2013-07-28 DIAGNOSIS — R05 Cough: Secondary | ICD-10-CM | POA: Insufficient documentation

## 2013-07-28 DIAGNOSIS — Z01810 Encounter for preprocedural cardiovascular examination: Secondary | ICD-10-CM | POA: Insufficient documentation

## 2013-07-28 DIAGNOSIS — R059 Cough, unspecified: Secondary | ICD-10-CM | POA: Insufficient documentation

## 2013-07-28 DIAGNOSIS — Z981 Arthrodesis status: Secondary | ICD-10-CM | POA: Insufficient documentation

## 2013-07-28 DIAGNOSIS — Z01818 Encounter for other preprocedural examination: Secondary | ICD-10-CM | POA: Insufficient documentation

## 2013-07-28 LAB — URINALYSIS, ROUTINE W REFLEX MICROSCOPIC
Bilirubin Urine: NEGATIVE
Glucose, UA: NEGATIVE mg/dL
Hgb urine dipstick: NEGATIVE
Ketones, ur: NEGATIVE mg/dL
Nitrite: NEGATIVE
Protein, ur: NEGATIVE mg/dL
Specific Gravity, Urine: 1.022 (ref 1.005–1.030)
Urobilinogen, UA: 0.2 mg/dL (ref 0.0–1.0)
pH: 5 (ref 5.0–8.0)

## 2013-07-28 LAB — CBC
HCT: 36.7 % (ref 36.0–46.0)
Hemoglobin: 12.1 g/dL (ref 12.0–15.0)
MCH: 29.8 pg (ref 26.0–34.0)
MCHC: 33 g/dL (ref 30.0–36.0)
MCV: 90.4 fL (ref 78.0–100.0)
Platelets: 283 10*3/uL (ref 150–400)
RBC: 4.06 MIL/uL (ref 3.87–5.11)
RDW: 12.7 % (ref 11.5–15.5)
WBC: 6.4 10*3/uL (ref 4.0–10.5)

## 2013-07-28 LAB — SURGICAL PCR SCREEN
MRSA, PCR: NEGATIVE
Staphylococcus aureus: POSITIVE — AB

## 2013-07-28 LAB — BASIC METABOLIC PANEL
BUN: 15 mg/dL (ref 6–23)
CO2: 28 mEq/L (ref 19–32)
Calcium: 9.9 mg/dL (ref 8.4–10.5)
Chloride: 102 mEq/L (ref 96–112)
Creatinine, Ser: 0.79 mg/dL (ref 0.50–1.10)
GFR calc Af Amer: >90 mL/min (ref >90)
GFR calc non Af Amer: 89 mL/min — ABNORMAL LOW (ref >90)
Glucose, Bld: 95 mg/dL (ref 70–99)
Potassium: 4.1 mEq/L (ref 3.7–5.3)
Sodium: 141 mEq/L (ref 137–147)

## 2013-07-28 LAB — URINE MICROSCOPIC-ADD ON

## 2013-07-28 LAB — APTT: aPTT: 27 seconds (ref 24–37)

## 2013-07-28 LAB — PROTIME-INR
INR: 0.87 (ref 0.00–1.49)
Prothrombin Time: 11.7 seconds (ref 11.6–15.2)

## 2013-07-28 NOTE — Progress Notes (Signed)
07-28-13 Labs viewable in epic note urinalysis.-[facility], is preferred Pharmacy [phone].

## 2013-07-28 NOTE — Pre-Procedure Instructions (Addendum)
07-28-13 EKG, CXR done today. 07-28-13 1600 Positive PCR for Staph aureus , Rx. Called to [place], [place] for Mupirocin Ointment. Pt. To use as directed. 08-01-12 1300 Surgery time changed-pt. Notified of change to 1005 AM , to arrive by 0700 AM, to be NPO after midnight except take meds with water.

## 2013-07-28 NOTE — Patient Instructions (Addendum)
[facility]  07/28/2013   Your procedure is scheduled on:  6-30 -2015  Enter through [facility] and follow signs to [facility]. Arrive at   1:00  PM.  Call this number if you have problems the morning of surgery: [phone]  Or Presurgical Testing [phone]([facility] [facility]) For Living Will and/or Health Care Power Attorney Forms: please provide copy for your medical record,may bring AM of surgery(Forms should be already notarized -we do not provide this service).(Yes/ 07-28-13 will provide AM of Surgery copy Living will/ HCPOA).     Do not eat food:After Midnight.  May have clear liquids:up to 6 Hours before arrival. Nothing after : 1000 Am  Clear liquids include soda, tea, black coffee, apple or grape juice, broth.  Take these medicines the morning of surgery with A SIP OF WATER: Cymbalta.   Do not wear jewelry, make-up or nail polish.  Do not wear lotions, powders, or perfumes. You may wear deodorant.  Do not shave 48 hours(2 days) prior to first CHG shower(legs and under arms).(Shaving face and neck okay.)  Do not bring valuables to the hospital.(Hospital is not responsible for lost valuables).  Contacts, dentures or removable bridgework, body piercing, hair pins may not be worn into surgery.  Leave suitcase in the car. After surgery it may be brought to your room.  For patients admitted to the hospital, checkout time is 11:00 AM the day of discharge.(Restricted visitors-Any Persons displaying flu-like symptoms or illness).    Patients discharged the day of surgery will not be allowed to drive home. Must have responsible person with you x 24 hours once discharged.  Name and phone number of your driver: spouse Toy Baker- [phone] cell  Special Instructions: CHG(Chlorhedine 4%-"Hibiclens","Betasept","Aplicare") Shower Use Special Wash: see special instructions.(avoid face and genitals)   Please read over the following fact sheets that you were given: MRSA  Information, Blood Transfusion fact sheet, Incentive Spirometry Instruction.  Remember : Type/Screen "Blue armbands" - may not be removed once applied(would result in being retested AM of surgery, if removed).  Failure to follow these instructions may result in Cancellation of your surgery.   ________________________    [facility] - Preparing for Surgery Before surgery, you can play an important role.  Because skin is not sterile, your skin needs to be as free of germs as possible.  You can reduce the number of germs on your skin by washing with CHG (chlorahexidine gluconate) soap before surgery.  CHG is an antiseptic cleaner which kills germs and bonds with the skin to continue killing germs even after washing. Please DO NOT use if you have an allergy to CHG or antibacterial soaps.  If your skin becomes reddened/irritated stop using the CHG and inform your nurse when you arrive at Short Stay. Do not shave (including legs and underarms) for at least 48 hours prior to the first CHG shower.  You may shave your face/neck. Please follow these instructions carefully:  1.  Shower with CHG Soap the night before surgery and the  morning of Surgery.  2.  If you choose to wash your hair, wash your hair first as usual with your  normal  shampoo.  3.  After you shampoo, rinse your hair and body thoroughly to remove the  shampoo.                           4.  Use CHG as you would any other  liquid soap.  You can apply chg directly  to the skin and wash                       Gently with a scrungie or clean washcloth.  5.  Apply the CHG Soap to your body ONLY FROM THE NECK DOWN.   Do not use on face/ open                           Wound or open sores. Avoid contact with eyes, ears mouth and genitals (private parts).                       Wash face,  Genitals (private parts) with your normal soap.             6.  Wash thoroughly, paying special attention to the area where your surgery  will be performed.  7.   Thoroughly rinse your body with warm water from the neck down.  8.  DO NOT shower/wash with your normal soap after using and rinsing off  the CHG Soap.                9.  Pat yourself dry with a clean towel.            10.  Wear clean pajamas.            11.  Place clean sheets on your bed the night of your first shower and do not  sleep with pets. Day of Surgery : Do not apply any lotions/deodorants the morning of surgery.  Please wear clean clothes to the hospital/surgery center.  FAILURE TO FOLLOW THESE INSTRUCTIONS MAY RESULT IN THE CANCELLATION OF YOUR SURGERY PATIENT SIGNATURE_________________________________  NURSE SIGNATURE__________________________________  ________________________________________________________________________   Adam Phenix  An incentive spirometer is a tool that can help keep your lungs clear and active. This tool measures how well you are filling your lungs with each breath. Taking long deep breaths may help reverse or decrease the chance of developing breathing (pulmonary) problems (especially infection) following:  A long period of time when you are unable to move or be active. BEFORE THE PROCEDURE   If the spirometer includes an indicator to show your best effort, your nurse or respiratory therapist will set it to a desired goal.  If possible, sit up straight or lean slightly forward. Try not to slouch.  Hold the incentive spirometer in an upright position. INSTRUCTIONS FOR USE  1. Sit on the edge of your bed if possible, or sit up as far as you can in bed or on a chair. 2. Hold the incentive spirometer in an upright position. 3. Breathe out normally. 4. Place the mouthpiece in your mouth and seal your lips tightly around it. 5. Breathe in slowly and as deeply as possible, raising the piston or the ball toward the top of the column. 6. Hold your breath for 3-5 seconds or for as long as possible. Allow the piston or ball to fall to the bottom  of the column. 7. Remove the mouthpiece from your mouth and breathe out normally. 8. Rest for a few seconds and repeat Steps 1 through 7 at least 10 times every 1-2 hours when you are awake. Take your time and take a few normal breaths between deep breaths. 9. The spirometer may include an indicator to show your best effort. Use the indicator as a  goal to work toward during each repetition. 10. After each set of 10 deep breaths, practice coughing to be sure your lungs are clear. If you have an incision (the cut made at the time of surgery), support your incision when coughing by placing a pillow or rolled up towels firmly against it. Once you are able to get out of bed, walk around indoors and cough well. You may stop using the incentive spirometer when instructed by your caregiver.  RISKS AND COMPLICATIONS  Take your time so you do not get dizzy or light-headed.  If you are in pain, you may need to take or ask for pain medication before doing incentive spirometry. It is harder to take a deep breath if you are having pain. AFTER USE  Rest and breathe slowly and easily.  It can be helpful to keep track of a log of your progress. Your caregiver can provide you with a simple table to help with this. If you are using the spirometer at home, follow these instructions: [place] IF:   You are having difficultly using the spirometer.  You have trouble using the spirometer as often as instructed.  Your pain medication is not giving enough relief while using the spirometer.  You develop fever of 100.5 F (38.1 C) or higher. SEEK IMMEDIATE MEDICAL CARE IF:   You cough up bloody sputum that had not been present before.  You develop fever of 102 F (38.9 C) or greater.  You develop worsening pain at or near the incision site. MAKE SURE YOU:   Understand these instructions.  Will watch your condition.  Will get help right away if you are not doing well or get worse. Document  Released: 06/08/2006 Document Revised: 04/20/2011 Document Reviewed: 08/09/2006 Exit[employer] Patient Information 2014 [employer], [place].   ________________________________________________________________________  WHAT IS A BLOOD TRANSFUSION? Blood Transfusion Information  A transfusion is the replacement of blood or some of its parts. Blood is made up of multiple cells which provide different functions.  Red blood cells carry oxygen and are used for blood loss replacement.  White blood cells fight against infection.  Platelets control bleeding.  Plasma helps clot blood.  Other blood products are available for specialized needs, such as hemophilia or other clotting disorders. BEFORE THE TRANSFUSION  Who gives blood for transfusions?   Healthy volunteers who are fully evaluated to make sure their blood is safe. This is blood bank blood. Transfusion therapy is the safest it has ever been in the practice of medicine. Before blood is taken from a donor, a complete history is taken to make sure that person has no history of diseases nor engages in risky social behavior (examples are intravenous drug use or sexual activity with multiple partners). The donor's travel history is screened to minimize risk of transmitting infections, such as malaria. The donated blood is tested for signs of infectious diseases, such as HIV and hepatitis. The blood is then tested to be sure it is compatible with you in order to minimize the chance of a transfusion reaction. If you or a relative donates blood, this is often done in anticipation of surgery and is not appropriate for emergency situations. It takes many days to process the donated blood. RISKS AND COMPLICATIONS Although transfusion therapy is very safe and saves many lives, the main dangers of transfusion include:   Getting an infectious disease.  Developing a transfusion reaction. This is an allergic reaction to something in the blood you were given. Every  precaution is taken to prevent this. The decision to have a blood transfusion has been considered carefully by your caregiver before blood is given. Blood is not given unless the benefits outweigh the risks. AFTER THE TRANSFUSION  Right after receiving a blood transfusion, you will usually feel much better and more energetic. This is especially true if your red blood cells have gotten low (anemic). The transfusion raises the level of the red blood cells which carry oxygen, and this usually causes an energy increase.  The nurse administering the transfusion will monitor you carefully for complications. HOME CARE INSTRUCTIONS  No special instructions are needed after a transfusion. You may find your energy is better. Speak with your caregiver about any limitations on activity for underlying diseases you may have. SEEK MEDICAL CARE IF:   Your condition is not improving after your transfusion.  You develop redness or irritation at the intravenous (IV) site. SEEK IMMEDIATE MEDICAL CARE IF:  Any of the following symptoms occur over the next 12 hours:  Shaking chills.  You have a temperature by mouth above 102 F (38.9 C), not controlled by medicine.  Chest, back, or muscle pain.  People around you feel you are not acting correctly or are confused.  Shortness of breath or difficulty breathing.  Dizziness and fainting.  You get a rash or develop hives.  You have a decrease in urine output.  Your urine turns a dark color or changes to pink, red, or brown. Any of the following symptoms occur over the next 10 days:  You have a temperature by mouth above 102 F (38.9 C), not controlled by medicine.  Shortness of breath.  Weakness after normal activity.  The white part of the eye turns yellow (jaundice).  You have a decrease in the amount of urine or are urinating less often.  Your urine turns a dark color or changes to pink, red, or brown. Document Released: 01/24/2000 Document  Revised: 04/20/2011 Document Reviewed: 09/12/2007 [facility] Patient Information 2014 [place], [place].  _______________________________________________________________________

## 2013-08-02 NOTE — H&P (Signed)
TOTAL KNEE ADMISSION H&P  Patient is being admitted for right total knee arthroplasty with saphenous nerve scar excision.  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  HPI: Connie Haynes, 58 y.o. female, has a history of pain and functional disability in the right knee due to arthritis and has failed non-surgical conservative treatments for greater than 12 weeks to includeNSAID's and/or analgesics, corticosteriod injections and activity modification.  Onset of symptoms was gradual, starting 1.5+ years ago with rapidlly worsening course since that time. The patient noted prior procedures on the knee to include  arthroscopy and menisectomy on the right knee(s).  Patient currently rates pain in the right knee(s) at 10 out of 10 with activity. Patient has night pain, worsening of pain with activity and weight bearing, pain that interferes with activities of daily living, pain with passive range of motion, crepitus and joint swelling.  Patient has evidence of periarticular osteophytes and joint space narrowing and meniscal injury by imaging studies.  There is no active infection.  Risks, benefits and expectations were discussed with the patient.  Risks including but not limited to the risk of anesthesia, blood clots, nerve damage, blood vessel damage, failure of the prosthesis, infection and up to and including death.  Patient understand the risks, benefits and expectations and wishes to proceed with surgery.   PCP: Ival Bible, MD  D/C Plans:      Home with HHPT  Post-op Meds:       No Rx given  Tranexamic Acid:      Is to be given  Decadron:      Is to be given  FYI:     ASA post-op  Norco with phenergan post-op   Patient Active Problem List   Diagnosis Date Noted  . Chronic pain syndrome 03/04/2013  . Depressive disorder, not elsewhere classified 03/04/2013  . Need for prophylactic vaccination and inoculation against influenza  10/04/2012  . Erosive osteoarthritis of multiple sites 10/04/2012  . Chronic cough 10/04/2012  . Swelling of limb 08/21/2012  . Essential hypertension, benign 08/21/2012  . Preoperative general physical examination 06/12/2012  . Restless leg syndrome 06/12/2012  . Anxiety state, unspecified 06/12/2012  . Lung nodule 07/16/2011  . Cough 04/21/2011  . Wolff-Parkinson-White (WPW) syndrome   . Osteoarthritis   . Hyperlipidemia   . IBS (irritable bowel syndrome)   . Kidney stones    Past Medical History  Diagnosis Date  . Wolff-Parkinson-White (WPW) syndrome   . Hypertension   . Hyperlipidemia   . IBS (irritable bowel syndrome)     controlled with med  . Kidney stones   . PONV (postoperative nausea and vomiting)     " woke up to soon"  . Cough   . Depression   . Disorder of tear duct system     frequent tearing of eyes  . Osteoarthritis     arthritis -joints-limited ROM shoulders more on right    Past Surgical History  Procedure Laterality Date  . Tear duct surgery    . Cervical spine surgery      cervical fusion with hardware retained  . Total abdominal hysterectomy    . Total hip arthroplasty      x 3(rt x2, lt x1)  . Ablation surgery      1993 [facility] (Dr. Hulen Skains)  . Eyelid carcinoma excision    . Joint replacement    . Cervical spine surgery    . Total  shoulder arthroplasty Left 07/15/2012    Procedure: LEFT TOTAL SHOULDER ARTHROPLASTY;  Surgeon: Augustin Schooling, MD;  Location: [facility];  Service: Orthopedics;  Laterality: Left;  . Colon surgery      "colon detached from abdomen"  . Ablation coronary      '93 for WPW syndrome    No prescriptions prior to admission   Allergies  Allergen Reactions  . Codeine     vomiting    History  Substance Use Topics  . Smoking status: Never Smoker   . Smokeless tobacco: Never Used  . Alcohol Use: No    Family History  Problem Relation Age of Onset  . Leukemia Brother   . Heart disease Mother   . Hypertension Mother    . Hyperlipidemia Mother   . Heart disease Maternal Grandfather   . Cancer Paternal Grandfather   . Lung cancer Paternal Grandfather   . Cancer Maternal Uncle     bone     Review of Systems  Constitutional: Negative.   HENT: Negative.   Eyes: Negative.   Respiratory: Positive for cough.   Cardiovascular: Positive for palpitations (WPW).  Gastrointestinal: Positive for diarrhea.  Genitourinary: Negative.   Musculoskeletal: Positive for joint pain.  Skin: Negative.   Neurological: Positive for tremors.  Endo/Heme/Allergies: Negative.   Psychiatric/Behavioral: Positive for depression. The patient is nervous/anxious.     Objective:  Physical Exam  Constitutional: She is oriented to person, place, and time. She appears well-developed and well-nourished.  HENT:  Head: Normocephalic and atraumatic.  Mouth/Throat: Oropharynx is clear and moist.  Eyes: Pupils are equal, round, and reactive to light.  Neck: Neck supple. No JVD present. No tracheal deviation present. No thyromegaly present.  Cardiovascular: Normal rate, regular rhythm, normal heart sounds and intact distal pulses.   Respiratory: Effort normal and breath sounds normal. No stridor. No respiratory distress. She has no wheezes.  GI: Soft. There is no tenderness. There is no guarding.  Musculoskeletal:       Right knee: She exhibits decreased range of motion, swelling, laceration (healed from KA, medial side painful) and bony tenderness. She exhibits no ecchymosis, no deformity and no erythema. Tenderness found.  Lymphadenopathy:    She has no cervical adenopathy.  Neurological: She is alert and oriented to person, place, and time.  Skin: Skin is warm and dry.  Psychiatric: She has a normal mood and affect.     Labs:  Estimated body mass index is 0.00 kg/(m^2) as calculated from the following:   Height as of 12/29/12: 5\' 5"  (1.651 m).   Weight as of 03/03/13: 0 kg (0 lb).   Imaging Review Plain radiographs  demonstrate severe degenerative joint disease of the right knee(s). The overall alignment is neutral. The bone quality appears to be good for age and reported activity level.  Assessment/Plan:  End stage arthritis, right knee   The patient history, physical examination, clinical judgment of the provider and imaging studies are consistent with end stage degenerative joint disease of the right knee(s) and total knee arthroplasty is deemed medically necessary. The treatment options including medical management, injection therapy arthroscopy and arthroplasty were discussed at length. The risks and benefits of total knee arthroplasty were presented and reviewed. The risks due to aseptic loosening, infection, stiffness, patella tracking problems, thromboembolic complications and other imponderables were discussed. The patient acknowledged the explanation, agreed to proceed with the plan and consent was signed. Patient is being admitted for inpatient treatment for surgery, pain control, PT, OT,  prophylactic antibiotics, VTE prophylaxis, progressive ambulation and ADL's and discharge planning. The patient is planning to be discharged home with home health services.     West Pugh Charlane Westry   PA-C  08/02/2013, 11:20 AM

## 2013-08-07 ENCOUNTER — Telehealth: Payer: Self-pay

## 2013-08-07 NOTE — Telephone Encounter (Signed)
Kathern from [facility] called to see about Connie Haynes's refills for Amitiza, she would like for [place] to call back at [phone]

## 2013-08-08 ENCOUNTER — Encounter (HOSPITAL_COMMUNITY): Payer: BC Managed Care – PPO | Admitting: Anesthesiology

## 2013-08-08 ENCOUNTER — Encounter (HOSPITAL_COMMUNITY)
Admission: RE | Disposition: A | Discharge status: Home Health | Payer: Self-pay | Source: Ambulatory Visit | Attending: Orthopedic Surgery

## 2013-08-08 ENCOUNTER — Inpatient Hospital Stay (HOSPITAL_COMMUNITY): Payer: BC Managed Care – PPO | Admitting: Anesthesiology

## 2013-08-08 ENCOUNTER — Inpatient Hospital Stay (HOSPITAL_COMMUNITY)
Admission: RE | Admit: 2013-08-08 | Discharge: 2013-08-09 | DRG: 470 | Disposition: A | Discharge status: Home Health | Payer: BC Managed Care – PPO | Source: Ambulatory Visit | Attending: Orthopedic Surgery | Admitting: Orthopedic Surgery

## 2013-08-08 ENCOUNTER — Encounter (HOSPITAL_COMMUNITY): Payer: Self-pay | Admitting: *Deleted

## 2013-08-08 DIAGNOSIS — Z87442 Personal history of urinary calculi: Secondary | ICD-10-CM

## 2013-08-08 DIAGNOSIS — M6580 Other synovitis and tenosynovitis, unspecified site: Secondary | ICD-10-CM | POA: Diagnosis present

## 2013-08-08 DIAGNOSIS — K589 Irritable bowel syndrome without diarrhea: Secondary | ICD-10-CM | POA: Diagnosis present

## 2013-08-08 DIAGNOSIS — G5780 Other specified mononeuropathies of unspecified lower limb: Secondary | ICD-10-CM | POA: Diagnosis present

## 2013-08-08 DIAGNOSIS — Z6841 Body Mass Index (BMI) 40.0 and over, adult: Secondary | ICD-10-CM

## 2013-08-08 DIAGNOSIS — E785 Hyperlipidemia, unspecified: Secondary | ICD-10-CM | POA: Diagnosis present

## 2013-08-08 DIAGNOSIS — M1710 Unilateral primary osteoarthritis, unspecified knee: Principal | ICD-10-CM | POA: Diagnosis present

## 2013-08-08 DIAGNOSIS — I10 Essential (primary) hypertension: Secondary | ICD-10-CM | POA: Diagnosis present

## 2013-08-08 DIAGNOSIS — Z96659 Presence of unspecified artificial knee joint: Secondary | ICD-10-CM

## 2013-08-08 DIAGNOSIS — Z96651 Presence of right artificial knee joint: Secondary | ICD-10-CM

## 2013-08-08 HISTORY — PX: TOTAL KNEE ARTHROPLASTY: SHX125

## 2013-08-08 LAB — TYPE AND SCREEN
ABO/RH(D): O POS
Antibody Screen: NEGATIVE

## 2013-08-08 SURGERY — ARTHROPLASTY, KNEE, TOTAL
Anesthesia: Spinal | Site: Knee | Laterality: Right

## 2013-08-08 MED ORDER — SODIUM CHLORIDE 0.9 % IJ SOLN
INTRAMUSCULAR | Status: DC | PRN
  Administered 2013-08-08: 9 mL

## 2013-08-08 MED ORDER — LOSARTAN POTASSIUM 50 MG PO TABS
100.0000 mg | ORAL_TABLET | Freq: Every day | ORAL | Status: DC
  Administered 2013-08-08: 100 mg via ORAL
  Filled 2013-08-08 (×2): qty 2

## 2013-08-08 MED ORDER — KETOROLAC TROMETHAMINE 30 MG/ML IJ SOLN
INTRAMUSCULAR | Status: DC | PRN
  Administered 2013-08-08: 30 mg

## 2013-08-08 MED ORDER — DEXAMETHASONE SODIUM PHOSPHATE 10 MG/ML IJ SOLN
INTRAMUSCULAR | Status: AC
  Filled 2013-08-08: qty 1

## 2013-08-08 MED ORDER — ROCURONIUM BROMIDE 100 MG/10ML IV SOLN
INTRAVENOUS | Status: AC
  Filled 2013-08-08: qty 1

## 2013-08-08 MED ORDER — ASPIRIN EC 325 MG PO TBEC
325.0000 mg | DELAYED_RELEASE_TABLET | Freq: Two times a day (BID) | ORAL | Status: DC
  Administered 2013-08-09: 325 mg via ORAL
  Filled 2013-08-08 (×3): qty 1

## 2013-08-08 MED ORDER — ALUM & MAG HYDROXIDE-SIMETH 200-200-20 MG/5ML PO SUSP: 30.0000 mL | ORAL | Status: DC | PRN

## 2013-08-08 MED ORDER — DEXAMETHASONE SODIUM PHOSPHATE 10 MG/ML IJ SOLN
10.0000 mg | Freq: Once | INTRAMUSCULAR | Status: AC
  Administered 2013-08-08: 10 mg via INTRAVENOUS

## 2013-08-08 MED ORDER — CHLORHEXIDINE GLUCONATE 4 % EX LIQD: 60.0000 mL | Freq: Once | CUTANEOUS | Status: DC

## 2013-08-08 MED ORDER — FENTANYL CITRATE 0.05 MG/ML IJ SOLN
INTRAMUSCULAR | Status: AC
  Filled 2013-08-08: qty 2

## 2013-08-08 MED ORDER — BUPIVACAINE-EPINEPHRINE (PF) 0.25% -1:200000 IJ SOLN
INTRAMUSCULAR | Status: DC | PRN
  Administered 2013-08-08: 26 mL

## 2013-08-08 MED ORDER — LACTATED RINGERS IV SOLN: INTRAVENOUS | Status: DC

## 2013-08-08 MED ORDER — FENTANYL CITRATE 0.05 MG/ML IJ SOLN
INTRAMUSCULAR | Status: DC | PRN
  Administered 2013-08-08: 100 ug via INTRAVENOUS

## 2013-08-08 MED ORDER — LACTATED RINGERS IV SOLN
INTRAVENOUS | Status: DC | PRN
  Administered 2013-08-08 (×2): via INTRAVENOUS

## 2013-08-08 MED ORDER — PROMETHAZINE HCL 25 MG/ML IJ SOLN: 6.2500 mg | Freq: Four times a day (QID) | INTRAMUSCULAR | Status: DC | PRN

## 2013-08-08 MED ORDER — SODIUM CHLORIDE 0.9 % IV SOLN
INTRAVENOUS | Status: DC
  Administered 2013-08-08: 16:00:00 via INTRAVENOUS
  Filled 2013-08-08 (×3): qty 1000

## 2013-08-08 MED ORDER — LIDOCAINE HCL (CARDIAC) 20 MG/ML IV SOLN
INTRAVENOUS | Status: AC
  Filled 2013-08-08: qty 5

## 2013-08-08 MED ORDER — PROPOFOL 10 MG/ML IV BOLUS
INTRAVENOUS | Status: AC
  Filled 2013-08-08: qty 20

## 2013-08-08 MED ORDER — CEFAZOLIN SODIUM-DEXTROSE 2-3 GM-% IV SOLR
INTRAVENOUS | Status: AC
  Filled 2013-08-08: qty 50

## 2013-08-08 MED ORDER — ONDANSETRON HCL 4 MG/2ML IJ SOLN
INTRAMUSCULAR | Status: AC
  Filled 2013-08-08: qty 2

## 2013-08-08 MED ORDER — BISACODYL 10 MG RE SUPP: 10.0000 mg | Freq: Every day | RECTAL | Status: DC | PRN

## 2013-08-08 MED ORDER — POLYETHYLENE GLYCOL 3350 17 G PO PACK
17.0000 g | PACK | Freq: Two times a day (BID) | ORAL | Status: DC
  Administered 2013-08-08: 17 g via ORAL

## 2013-08-08 MED ORDER — PROMETHAZINE HCL 25 MG/ML IJ SOLN: 6.2500 mg | INTRAMUSCULAR | Status: DC | PRN

## 2013-08-08 MED ORDER — BUPIVACAINE-EPINEPHRINE (PF) 0.25% -1:200000 IJ SOLN
INTRAMUSCULAR | Status: AC
  Filled 2013-08-08: qty 30

## 2013-08-08 MED ORDER — HYDROMORPHONE HCL PF 1 MG/ML IJ SOLN
0.2500 mg | INTRAMUSCULAR | Status: DC | PRN
  Administered 2013-08-08: 0.5 mg via INTRAVENOUS

## 2013-08-08 MED ORDER — DIPHENHYDRAMINE HCL 25 MG PO CAPS
25.0000 mg | ORAL_CAPSULE | Freq: Four times a day (QID) | ORAL | Status: DC | PRN
  Administered 2013-08-09 (×2): 25 mg via ORAL
  Filled 2013-08-08 (×2): qty 1

## 2013-08-08 MED ORDER — PROPOFOL INFUSION 10 MG/ML OPTIME
INTRAVENOUS | Status: DC | PRN
  Administered 2013-08-08: 70 ug/kg/min via INTRAVENOUS

## 2013-08-08 MED ORDER — ONDANSETRON HCL 4 MG/2ML IJ SOLN
INTRAMUSCULAR | Status: DC | PRN
  Administered 2013-08-08: 4 mg via INTRAVENOUS

## 2013-08-08 MED ORDER — DULOXETINE HCL 30 MG PO CPEP
30.0000 mg | ORAL_CAPSULE | Freq: Every morning | ORAL | Status: DC
  Administered 2013-08-09: 30 mg via ORAL
  Filled 2013-08-08: qty 1

## 2013-08-08 MED ORDER — LOSARTAN POTASSIUM-HCTZ 100-12.5 MG PO TABS: 1.0000 | ORAL_TABLET | Freq: Every morning | ORAL | Status: DC

## 2013-08-08 MED ORDER — FERROUS SULFATE 325 (65 FE) MG PO TABS
325.0000 mg | ORAL_TABLET | Freq: Three times a day (TID) | ORAL | Status: DC
  Administered 2013-08-09: 325 mg via ORAL
  Filled 2013-08-08 (×5): qty 1

## 2013-08-08 MED ORDER — KETOROLAC TROMETHAMINE 30 MG/ML IJ SOLN
INTRAMUSCULAR | Status: AC
  Filled 2013-08-08: qty 1

## 2013-08-08 MED ORDER — PHENOL 1.4 % MT LIQD: 1.0000 | OROMUCOSAL | Status: DC | PRN

## 2013-08-08 MED ORDER — METOCLOPRAMIDE HCL 5 MG/ML IJ SOLN: 5.0000 mg | Freq: Three times a day (TID) | INTRAMUSCULAR | Status: DC | PRN

## 2013-08-08 MED ORDER — CEFAZOLIN SODIUM-DEXTROSE 2-3 GM-% IV SOLR
2.0000 g | Freq: Four times a day (QID) | INTRAVENOUS | Status: AC
  Administered 2013-08-08 (×2): 2 g via INTRAVENOUS
  Filled 2013-08-08 (×2): qty 50

## 2013-08-08 MED ORDER — KETAMINE HCL 10 MG/ML IJ SOLN
INTRAMUSCULAR | Status: AC
  Filled 2013-08-08: qty 1

## 2013-08-08 MED ORDER — ONDANSETRON HCL 4 MG/2ML IJ SOLN
4.0000 mg | Freq: Four times a day (QID) | INTRAMUSCULAR | Status: DC | PRN
  Administered 2013-08-08: 4 mg via INTRAVENOUS
  Filled 2013-08-08: qty 2

## 2013-08-08 MED ORDER — METHOCARBAMOL 1000 MG/10ML IJ SOLN
500.0000 mg | Freq: Four times a day (QID) | INTRAVENOUS | Status: DC | PRN
  Administered 2013-08-08: 500 mg via INTRAVENOUS
  Filled 2013-08-08: qty 5

## 2013-08-08 MED ORDER — BUPIVACAINE LIPOSOME 1.3 % IJ SUSP
20.0000 mL | Freq: Once | INTRAMUSCULAR | Status: AC
  Administered 2013-08-08: 20 mL
  Filled 2013-08-08: qty 20

## 2013-08-08 MED ORDER — METHOCARBAMOL 500 MG PO TABS: 500.0000 mg | ORAL_TABLET | Freq: Four times a day (QID) | ORAL | Status: DC | PRN

## 2013-08-08 MED ORDER — HYDROCHLOROTHIAZIDE 12.5 MG PO CAPS
12.5000 mg | ORAL_CAPSULE | Freq: Every day | ORAL | Status: DC
  Administered 2013-08-08: 12.5 mg via ORAL
  Filled 2013-08-08 (×2): qty 1

## 2013-08-08 MED ORDER — CELECOXIB 200 MG PO CAPS
200.0000 mg | ORAL_CAPSULE | Freq: Two times a day (BID) | ORAL | Status: DC
  Administered 2013-08-08 – 2013-08-09 (×2): 200 mg via ORAL
  Filled 2013-08-08 (×3): qty 1

## 2013-08-08 MED ORDER — SODIUM CHLORIDE 0.9 % IJ SOLN
INTRAMUSCULAR | Status: AC
  Filled 2013-08-08: qty 10

## 2013-08-08 MED ORDER — SODIUM CHLORIDE 0.9 % IV SOLN
1000.0000 mg | Freq: Once | INTRAVENOUS | Status: AC
  Administered 2013-08-08: 1000 mg via INTRAVENOUS
  Filled 2013-08-08: qty 10

## 2013-08-08 MED ORDER — LUBIPROSTONE 24 MCG PO CAPS
24.0000 ug | ORAL_CAPSULE | Freq: Every evening | ORAL | Status: DC
  Filled 2013-08-08 (×3): qty 1

## 2013-08-08 MED ORDER — MIDAZOLAM HCL 2 MG/2ML IJ SOLN
INTRAMUSCULAR | Status: AC
  Filled 2013-08-08: qty 2

## 2013-08-08 MED ORDER — PROMETHAZINE HCL 25 MG PO TABS
12.5000 mg | ORAL_TABLET | Freq: Four times a day (QID) | ORAL | Status: DC | PRN
Start: 2013-08-08 — End: 2013-08-09

## 2013-08-08 MED ORDER — MAGNESIUM CITRATE PO SOLN: 1.0000 | Freq: Once | ORAL | Status: AC | PRN

## 2013-08-08 MED ORDER — 0.9 % SODIUM CHLORIDE (POUR BTL) OPTIME
TOPICAL | Status: DC | PRN
  Administered 2013-08-08: 1000 mL

## 2013-08-08 MED ORDER — HYDROMORPHONE HCL PF 1 MG/ML IJ SOLN
0.5000 mg | INTRAMUSCULAR | Status: DC | PRN
  Administered 2013-08-08: 0.5 mg via INTRAVENOUS
  Administered 2013-08-08: 2 mg via INTRAVENOUS
  Administered 2013-08-09: 1 mg via INTRAVENOUS
  Filled 2013-08-08 (×2): qty 1
  Filled 2013-08-08: qty 2
  Filled 2013-08-08: qty 1

## 2013-08-08 MED ORDER — KETAMINE HCL 10 MG/ML IJ SOLN
INTRAMUSCULAR | Status: DC | PRN
  Administered 2013-08-08: 20 mg via INTRAVENOUS

## 2013-08-08 MED ORDER — HYDROMORPHONE HCL PF 1 MG/ML IJ SOLN
INTRAMUSCULAR | Status: AC
  Filled 2013-08-08: qty 1

## 2013-08-08 MED ORDER — CEFAZOLIN SODIUM-DEXTROSE 2-3 GM-% IV SOLR
2.0000 g | INTRAVENOUS | Status: AC
  Administered 2013-08-08: 2 g via INTRAVENOUS

## 2013-08-08 MED ORDER — DEXAMETHASONE SODIUM PHOSPHATE 10 MG/ML IJ SOLN
10.0000 mg | Freq: Once | INTRAMUSCULAR | Status: DC
Start: 2013-08-09 — End: 2013-08-09
  Filled 2013-08-08: qty 1

## 2013-08-08 MED ORDER — DOCUSATE SODIUM 100 MG PO CAPS
100.0000 mg | ORAL_CAPSULE | Freq: Two times a day (BID) | ORAL | Status: DC
  Administered 2013-08-08 – 2013-08-09 (×2): 100 mg via ORAL

## 2013-08-08 MED ORDER — HYDROCODONE-ACETAMINOPHEN 7.5-325 MG PO TABS
1.0000 | ORAL_TABLET | ORAL | Status: DC
  Administered 2013-08-08 – 2013-08-09 (×6): 2 via ORAL
  Filled 2013-08-08 (×6): qty 2

## 2013-08-08 MED ORDER — MENTHOL 3 MG MT LOZG: 1.0000 | LOZENGE | OROMUCOSAL | Status: DC | PRN

## 2013-08-08 MED ORDER — ONDANSETRON HCL 4 MG PO TABS: 4.0000 mg | ORAL_TABLET | Freq: Four times a day (QID) | ORAL | Status: DC | PRN

## 2013-08-08 MED ORDER — MIDAZOLAM HCL 5 MG/5ML IJ SOLN
INTRAMUSCULAR | Status: DC | PRN
  Administered 2013-08-08: 2 mg via INTRAVENOUS

## 2013-08-08 MED ORDER — METOCLOPRAMIDE HCL 10 MG PO TABS: 5.0000 mg | ORAL_TABLET | Freq: Three times a day (TID) | ORAL | Status: DC | PRN

## 2013-08-08 MED ORDER — SODIUM CHLORIDE 0.9 % IR SOLN
Status: DC | PRN
  Administered 2013-08-08: 1000 mL

## 2013-08-08 SURGICAL SUPPLY — 58 items
BAG ZIPLOCK 12X15 (MISCELLANEOUS) IMPLANT
BANDAGE ELASTIC 6 VELCRO ST LF (GAUZE/BANDAGES/DRESSINGS) ×2 IMPLANT
BANDAGE ESMARK 6X9 LF (GAUZE/BANDAGES/DRESSINGS) ×1 IMPLANT
BLADE SAW SGTL 13.0X1.19X90.0M (BLADE) ×2 IMPLANT
BNDG ESMARK 6X9 LF (GAUZE/BANDAGES/DRESSINGS) ×2
BOWL SMART MIX CTS (DISPOSABLE) ×2 IMPLANT
CAP KNEE ATTUNE RP ×2 IMPLANT
CEMENT HV SMART SET (Cement) ×4 IMPLANT
CUFF TOURN SGL QUICK 34 (TOURNIQUET CUFF) ×1
CUFF TRNQT CYL 34X4X40X1 (TOURNIQUET CUFF) ×1 IMPLANT
DERMABOND ADVANCED (GAUZE/BANDAGES/DRESSINGS) ×1
DERMABOND ADVANCED .7 DNX12 (GAUZE/BANDAGES/DRESSINGS) ×1 IMPLANT
DRAPE EXTREMITY T 121X128X90 (DRAPE) ×2 IMPLANT
DRAPE POUCH INSTRU U-SHP 10X18 (DRAPES) ×2 IMPLANT
DRAPE U-SHAPE 47X51 STRL (DRAPES) ×2 IMPLANT
DRSG AQUACEL AG ADV 3.5X10 (GAUZE/BANDAGES/DRESSINGS) ×2 IMPLANT
DRSG TEGADERM 4X4.75 (GAUZE/BANDAGES/DRESSINGS) IMPLANT
DURAPREP 26ML APPLICATOR (WOUND CARE) ×4 IMPLANT
ELECT REM PT RETURN 9FT ADLT (ELECTROSURGICAL) ×2
ELECTRODE REM PT RTRN 9FT ADLT (ELECTROSURGICAL) ×1 IMPLANT
FACESHIELD WRAPAROUND (MASK) ×8 IMPLANT
GAUZE SPONGE 2X2 8PLY STRL LF (GAUZE/BANDAGES/DRESSINGS) IMPLANT
GLOVE BIOGEL PI IND STRL 6.5 (GLOVE) ×1 IMPLANT
GLOVE BIOGEL PI IND STRL 7.0 (GLOVE) ×1 IMPLANT
GLOVE BIOGEL PI IND STRL 7.5 (GLOVE) ×1 IMPLANT
GLOVE BIOGEL PI IND STRL 8 (GLOVE) ×2 IMPLANT
GLOVE BIOGEL PI INDICATOR 6.5 (GLOVE) ×1
GLOVE BIOGEL PI INDICATOR 7.0 (GLOVE) ×1
GLOVE BIOGEL PI INDICATOR 7.5 (GLOVE) ×1
GLOVE BIOGEL PI INDICATOR 8 (GLOVE) ×2
GLOVE ECLIPSE 7.5 STRL STRAW (GLOVE) ×2 IMPLANT
GLOVE ECLIPSE 8.0 STRL XLNG CF (GLOVE) ×4 IMPLANT
GLOVE ORTHO TXT STRL SZ7.5 (GLOVE) ×4 IMPLANT
GLOVE SURG SS PI 7.0 STRL IVOR (GLOVE) ×2 IMPLANT
GOWN SPEC L3 XXLG W/TWL (GOWN DISPOSABLE) ×4 IMPLANT
GOWN STRL REUS W/TWL LRG LVL3 (GOWN DISPOSABLE) ×2 IMPLANT
GOWN STRL REUS W/TWL XL LVL3 (GOWN DISPOSABLE) ×2 IMPLANT
HANDPIECE INTERPULSE COAX TIP (DISPOSABLE) ×1
KIT BASIN OR (CUSTOM PROCEDURE TRAY) ×2 IMPLANT
MANIFOLD NEPTUNE II (INSTRUMENTS) ×2 IMPLANT
NDL SAFETY ECLIPSE 18X1.5 (NEEDLE) ×1 IMPLANT
NEEDLE HYPO 18GX1.5 SHARP (NEEDLE) ×1
PACK TOTAL JOINT (CUSTOM PROCEDURE TRAY) ×2 IMPLANT
POSITIONER SURGICAL ARM (MISCELLANEOUS) ×2 IMPLANT
SET HNDPC FAN SPRY TIP SCT (DISPOSABLE) ×1 IMPLANT
SET PAD KNEE POSITIONER (MISCELLANEOUS) ×2 IMPLANT
SPONGE GAUZE 2X2 STER 10/PKG (GAUZE/BANDAGES/DRESSINGS)
SUCTION FRAZIER 12FR DISP (SUCTIONS) ×2 IMPLANT
SUT MNCRL AB 4-0 PS2 18 (SUTURE) ×2 IMPLANT
SUT VIC AB 1 CT1 36 (SUTURE) ×2 IMPLANT
SUT VIC AB 2-0 CT1 27 (SUTURE) ×3
SUT VIC AB 2-0 CT1 TAPERPNT 27 (SUTURE) ×3 IMPLANT
SUT VLOC 180 0 24IN GS25 (SUTURE) ×2 IMPLANT
SYRINGE 60CC LL (MISCELLANEOUS) ×2 IMPLANT
TOWEL OR 17X26 10 PK STRL BLUE (TOWEL DISPOSABLE) ×2 IMPLANT
TRAY FOLEY CATH 14FRSI W/METER (CATHETERS) ×2 IMPLANT
WATER STERILE IRR 1500ML POUR (IV SOLUTION) ×2 IMPLANT
WRAP KNEE MAXI GEL POST OP (GAUZE/BANDAGES/DRESSINGS) ×2 IMPLANT

## 2013-08-08 NOTE — Anesthesia Procedure Notes (Signed)
Spinal  Patient location during procedure: OR Start time: 08/08/2013 10:10 AM End time: 08/08/2013 10:15 AM Staffing Anesthesiologist: Freddie Apley F Performed by: anesthesiologist  Preanesthetic Checklist Completed: patient identified, site marked, surgical consent, pre-op evaluation, timeout performed, IV checked, risks and benefits discussed and monitors and equipment checked Spinal Block Patient position: sitting Prep: Betadine Patient monitoring: heart rate, continuous pulse ox and blood pressure Approach: midline Location: L3-4 Injection technique: single-shot Needle Needle type: Spinocan  Needle gauge: 22 G Needle length: 9 cm Additional Notes Expiration date of kit checked and confirmed. Patient tolerated procedure well, without complications.

## 2013-08-08 NOTE — Anesthesia Postprocedure Evaluation (Signed)
Anesthesia Post Note  Patient: Connie Haynes  Procedure(s) Performed: Procedure(s) (LRB): RIGHT TOTAL KNEE ARTHROPLASTY WITH SAPHENOUS NERVE SCAR EXCISION (Right)  Anesthesia type: Spinal  Patient location: PACU  Post pain: Pain level controlled  Post assessment: Post-op Vital signs reviewed  Last Vitals:  Filed Vitals:   08/08/13 1351  BP: 140/81  Pulse: 63  Temp:   Resp: 14    Post vital signs: Reviewed  Level of consciousness: sedated  Complications: No apparent anesthesia complications

## 2013-08-08 NOTE — Anesthesia Preprocedure Evaluation (Signed)
Anesthesia Evaluation  Patient identified by MRN, date of birth, ID band Patient awake    Reviewed: Allergy & Precautions, H&P , NPO status , Patient's Chart, lab work & pertinent test results  History of Anesthesia Complications (+) PONV and history of anesthetic complications  Airway Mallampati: II  Neck ROM: Full    Dental  (+) Teeth Intact, Dental Advisory Given   Pulmonary neg pulmonary ROS,  breath sounds clear to auscultation        Cardiovascular hypertension, Pt. on medications + dysrhythmias (Hx of WPW; s/p ablation with infrequent tachyarrhythmia ) Rhythm:Regular Rate:Normal     Neuro/Psych PSYCHIATRIC DISORDERS Anxiety Depression negative neurological ROS     GI/Hepatic negative GI ROS, Neg liver ROS,   Endo/Other  Morbid obesity  Renal/GU negative Renal ROS  negative genitourinary   Musculoskeletal  (+) Arthritis -, Osteoarthritis,    Abdominal (+) + obese,   Peds  Hematology negative hematology ROS (+)   Anesthesia Other Findings   Reproductive/Obstetrics negative OB ROS                           Anesthesia Physical Anesthesia Plan  ASA: III  Anesthesia Plan: Spinal   Post-op Pain Management:    Induction: Intravenous  Airway Management Planned: Simple Face Mask  Additional Equipment:   Intra-op Plan:   Post-operative Plan:   Informed Consent: I have reviewed the patients History and Physical, chart, labs and discussed the procedure including the risks, benefits and alternatives for the proposed anesthesia with the patient or authorized representative who has indicated his/her understanding and acceptance.   Dental advisory given  Plan Discussed with: CRNA  Anesthesia Plan Comments:         Anesthesia Quick Evaluation

## 2013-08-08 NOTE — Interval H&P Note (Signed)
History and Physical Interval Note:  08/08/2013 8:49 AM  Connie Haynes  has presented today for surgery, with the diagnosis of RIGHT KNEE OSTEOARTHRITIS  The various methods of treatment have been discussed with the patient and family. After consideration of risks, benefits and other options for treatment, the patient has consented to  Procedure(s): RIGHT TOTAL KNEE ARTHROPLASTY WITH SAPHENOUS NERVE SCAR EXCISION (Right) as a surgical intervention .  The patient's history has been reviewed, patient examined, no change in status, stable for surgery.  I have reviewed the patient's chart and labs.  Questions were answered to the patient's satisfaction.     Mauri Pole

## 2013-08-08 NOTE — Plan of Care (Signed)
Problem: Consults Goal: Diagnosis- Total Joint Replacement Right total knee     

## 2013-08-08 NOTE — Transfer of Care (Signed)
Immediate Anesthesia Transfer of Care Note  Patient: Connie Haynes  Procedure(s) Performed: Procedure(s): RIGHT TOTAL KNEE ARTHROPLASTY WITH SAPHENOUS NERVE SCAR EXCISION (Right)  Patient Location: PACU  Anesthesia Type:Spinal  Level of Consciousness: sedated  Airway & Oxygen Therapy: Patient Spontanous Breathing and Patient connected to face mask oxygen  Post-op Assessment: Report given to PACU RN and Post -op Vital signs reviewed and stable  Post vital signs: Reviewed and stable  Complications: No apparent anesthesia complications

## 2013-08-09 LAB — CBC
HCT: 36.3 % (ref 36.0–46.0)
Hemoglobin: 12.3 g/dL (ref 12.0–15.0)
MCH: 30.7 pg (ref 26.0–34.0)
MCHC: 33.9 g/dL (ref 30.0–36.0)
MCV: 90.5 fL (ref 78.0–100.0)
Platelets: 369 10*3/uL (ref 150–400)
RBC: 4.01 MIL/uL (ref 3.87–5.11)
RDW: 12.4 % (ref 11.5–15.5)
WBC: 14.7 10*3/uL — ABNORMAL HIGH (ref 4.0–10.5)

## 2013-08-09 LAB — BASIC METABOLIC PANEL
BUN: 15 mg/dL (ref 6–23)
CO2: 23 mEq/L (ref 19–32)
Calcium: 8.8 mg/dL (ref 8.4–10.5)
Chloride: 103 mEq/L (ref 96–112)
Creatinine, Ser: 0.77 mg/dL (ref 0.50–1.10)
GFR calc Af Amer: >90 mL/min (ref >90)
GFR calc non Af Amer: 89 mL/min — ABNORMAL LOW (ref >90)
Glucose, Bld: 144 mg/dL — ABNORMAL HIGH (ref 70–99)
Potassium: 4.2 mEq/L (ref 3.7–5.3)
Sodium: 141 mEq/L (ref 137–147)

## 2013-08-09 MED ORDER — POLYETHYLENE GLYCOL 3350 17 G PO PACK: 17.0000 g | PACK | Freq: Two times a day (BID) | ORAL | Status: DC

## 2013-08-09 MED ORDER — FERROUS SULFATE 325 (65 FE) MG PO TABS: 325.0000 mg | ORAL_TABLET | Freq: Three times a day (TID) | ORAL | Status: DC

## 2013-08-09 MED ORDER — METHOCARBAMOL 500 MG PO TABS: 500.0000 mg | ORAL_TABLET | Freq: Four times a day (QID) | ORAL | Status: DC | PRN

## 2013-08-09 MED ORDER — ASPIRIN 325 MG PO TBEC: 325.0000 mg | DELAYED_RELEASE_TABLET | Freq: Two times a day (BID) | ORAL | Status: AC

## 2013-08-09 MED ORDER — DSS 100 MG PO CAPS: 100.0000 mg | ORAL_CAPSULE | Freq: Two times a day (BID) | ORAL | Status: DC

## 2013-08-09 MED ORDER — HYDROCODONE-ACETAMINOPHEN 7.5-325 MG PO TABS: 1.0000 | ORAL_TABLET | ORAL | Status: DC

## 2013-08-09 MED ORDER — PROMETHAZINE HCL 12.5 MG PO TABS: 12.5000 mg | ORAL_TABLET | Freq: Four times a day (QID) | ORAL | Status: DC | PRN

## 2013-08-09 NOTE — Discharge Summary (Signed)
Physician Discharge Summary  Patient ID: CESIA ORF MRN: [id number] DOB/AGE: December 14, 1955 58 y.o.  Admit date: 08/08/2013 Discharge date: 08/09/2013   Procedures:  Procedure(s) (LRB): RIGHT TOTAL KNEE ARTHROPLASTY WITH SAPHENOUS NERVE SCAR EXCISION (Right)  Attending Physician:  Dr. 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 Wolff-Parkinson-White (WPW) syndrome   . Hypertension   . Hyperlipidemia   . IBS (irritable bowel syndrome)     controlled with med  . Kidney stones   . PONV (postoperative nausea and vomiting)     " woke up to soon"  . Cough   . Depression   . Disorder of tear duct system     frequent tearing of eyes  . Osteoarthritis     arthritis -joints-limited ROM shoulders more on right    HPI: Nicki Reaper, 58 y.o. female, has a history of pain and functional disability in the right knee due to arthritis and has failed non-surgical conservative treatments for greater than 12 weeks to includeNSAID's and/or analgesics, corticosteriod injections and activity modification. Onset of symptoms was gradual, starting 1.5+ years ago with rapidlly worsening course since that time. The patient noted prior procedures on the knee to include arthroscopy and menisectomy on the right knee(s). Patient currently rates pain in the right knee(s) at 10 out of 10 with activity. Patient has night pain, worsening of pain with activity and weight bearing, pain that interferes with activities of daily living, pain with passive range of motion, crepitus and joint swelling. Patient has evidence of periarticular osteophytes and joint space narrowing and meniscal injury by imaging studies. There is no active infection. Risks, benefits and expectations were discussed with the patient. Risks including  but not limited to the risk of anesthesia, blood clots, nerve damage, blood vessel damage, failure of the prosthesis, infection and up to and including death. Patient understand the risks, benefits and expectations and wishes to proceed with surgery.   PCP: Ival Bible, MD   Discharged Condition: good  Hospital Course:  Patient underwent the above stated procedure on 08/08/2013. Patient tolerated the procedure well and brought to the recovery room in good condition and subsequently to the floor.  POD #1 BP: 143/78 ; Pulse: 89 ; Temp: 98.6 F (37 C) ; Resp: 18 Patient reports pain as mild, pain controlled. Having some knee pain, but can already tell that the pain from the saphenous nerve has resolved. Ready to be discharged home. Neurovascular intact, dorsiflexion/plantar flexion intact, incision: dressing C/D/I, no cellulitis present and compartment soft.   LABS  Basename    HGB  12.3  HCT  36.3    Discharge Exam: General appearance: alert, cooperative and no distress Extremities: Homans sign is negative, no sign of DVT, no edema, redness or tenderness in the calves or thighs and no ulcers, gangrene or trophic changes  Disposition: Home with follow up in 2 weeks   Follow-up Information   Follow up with Mauri Pole, MD. Schedule an appointment as soon as possible for a visit in 2 weeks.   Specialty:  Orthopedic Surgery   Contact information:   [address] [place] [address] [phone]       Discharge Instructions   Call MD / Call 911    Complete by:  As directed   If you experience chest pain or shortness of breath, CALL 911 and be transported to the hospital emergency room.  If you develope a fever above 101 F, pus (white drainage) or increased drainage or redness at the wound, or calf pain, call your surgeon's office.     Change dressing    Complete by:  As directed   Maintain surgical dressing for 10-14 days, or until follow up in the clinic.      Constipation Prevention    Complete by:  As directed   Drink plenty of fluids.  Prune juice may be helpful.  You may use a stool softener, such as Colace (over the counter) 100 mg twice a day.  Use MiraLax (over the counter) for constipation as needed.     Diet - low sodium heart healthy    Complete by:  As directed      Discharge instructions    Complete by:  As directed   Maintain surgical dressing for 10-14 days, or until follow up in the clinic. Follow up in 2 weeks at [facility] Call with any questions or concerns.     Driving restrictions    Complete by:  As directed   No driving for 4 weeks     Increase activity slowly as tolerated    Complete by:  As directed      TED hose    Complete by:  As directed   Use stockings (TED hose) for 2 weeks on both leg(s).  You may remove them at night for sleeping.     Weight bearing as tolerated    Complete by:  As directed   Laterality:  right  Extremity:  Lower             Medication List    STOP taking these medications       mupirocin nasal ointment 2 %  Commonly known as:  BACTROBAN      TAKE these medications       aspirin 325 MG EC tablet  Take 1 tablet (325 mg total) by mouth 2 (two) times daily.     CALTRATE 600 PLUS-VIT D PO  Take 1 tablet by mouth daily.     celecoxib 200 MG capsule  Commonly known as:  CELEBREX  Take 200 mg by mouth every morning.     DSS 100 MG Caps  Take 100 mg by mouth 2 (two) times daily.     DULoxetine 30 MG capsule  Commonly known as:  CYMBALTA  Take 30 mg by mouth every morning.     ferrous sulfate 325 (65 FE) MG tablet  Take 1 tablet (325 mg total) by mouth 3 (three) times daily after meals.     HYDROcodone-acetaminophen 7.5-325 MG per tablet  Commonly known as:  NORCO  Take 1-2 tablets by mouth every 4 (four) hours.     losartan-hydrochlorothiazide 100-12.5 MG per tablet  Commonly known as:  HYZAAR  Take 1 tablet by mouth every morning.     lubiprostone 24  MCG capsule  Commonly known as:  AMITIZA  Take 24 mcg by mouth every evening.     methocarbamol 500 MG tablet  Commonly known as:  ROBAXIN  Take 1 tablet (500 mg total) by mouth every 6 (six) hours as needed for muscle spasms.     multivitamin tablet  Take 1 tablet by mouth daily.     polyethylene glycol packet  Commonly known as:  MIRALAX / GLYCOLAX  Take 17 g by mouth 2 (two) times daily.     promethazine 12.5 MG tablet  Commonly known as:  PHENERGAN  Take 1 tablet (12.5 mg total) by mouth every 6 (six) hours as needed for nausea.     Vitamin D 1000 UNITS capsule  Take 1,000 Units by mouth daily.         Signed: West Pugh. Gissel Keilman   PA-C  08/09/2013, 5:53 PM

## 2013-08-09 NOTE — Evaluation (Signed)
Occupational Therapy Evaluation Patient Details Name: Connie Haynes MRN: [id number] DOB: [birth year] Today's Date: 08/09/2013    History of Present Illness RTKA   Clinical Impression   This 58 year old female was admitted for the above surgery.  All education was completed. No further OT is needed at this time.    Follow Up Recommendations  No OT follow up    Equipment Recommendations  3 in 1 bedside comode    Recommendations for Other Services       Precautions / Restrictions Precautions Precautions: Knee Restrictions Weight Bearing Restrictions: No      Mobility Bed Mobility              Transfers Overall transfer level: Needs assistance Equipment used: Rolling walker (2 wheeled) Transfers: Sit to/from Stand Sit to Stand: Min guard;From elevated surface         General transfer comment: cues for technique, hand placement    Balance                                            ADL Overall ADL's : Needs assistance/impaired                         Toilet Transfer: Ambulation;Min guard       Tub/ Banker: Ambulation;Walk-in shower;Min guard     General ADL Comments: Husband had assisted with adls prior to OTs arrival.  Pt needs overall mod A for LB adls and set up for UB adls.  Practiced bathroom transfers and educated on sidestepping through tight spaces.  Pt does have a reacher at home:  explained adl uses.  She has a high bed with a long step (aerobic) to assist in getting up.  Reviewed sequence of this.       Vision                     Perception     Praxis      Pertinent Vitals/Pain Sore R knee; repositioned and ice applied     Hand Dominance     Extremity/Trunk Assessment Upper Extremity Assessment Upper Extremity Assessment: WFLs during eval    Lower Extremity Assessment Lower Extremity Assessment: RLE deficits/detail RLE Deficits / Details: able to perform SLR       Communication  Communication Communication: No difficulties   Cognition Arousal/Alertness: Awake/alert Behavior During Therapy: WFL for tasks assessed/performed Overall Cognitive Status: Within Functional Limits for tasks assessed                     General Comments       Exercises       Shoulder Instructions      Home Living Family/patient expects to be discharged to:: Private residence Living Arrangements: Spouse/significant other Available Help at Discharge: Family Type of Home: House Home Access: Stairs to enter Technical brewer of Steps: 2 Entrance Stairs-Rails: None Home Layout: Two level;Able to live on main level with bedroom/bathroom     Bathroom Shower/Tub: Occupational psychologist: Standard     Home Equipment: Environmental consultant - 2 wheels;Bedside commode          Prior Functioning/Environment Level of Independence: Independent             OT Diagnosis:     OT Problem List:  OT Treatment/Interventions:      OT Goals(Current goals can be found in the care plan section) Acute Rehab OT Goals Patient Stated Goal: to go home  OT Frequency:     Barriers to D/C:            Co-evaluation              End of Session    Activity Tolerance: Patient tolerated treatment well Patient left: in chair;with call bell/phone within reach;with family/visitor present   Time: 9417-4081 OT Time Calculation (min): 14 min Charges:  OT General Charges $OT Visit: 1 Procedure OT Evaluation $Initial OT Evaluation Tier I: 1 Procedure OT Treatments $Self Care/Home Management : 8-22 mins G-Codes:    Kaysan Peixoto September 01, 2013, 11:54 AM  Lesle Chris, OTR/L [phone] 2013/09/01

## 2013-08-09 NOTE — Progress Notes (Signed)
   Subjective: 1 Day Post-Op Procedure(s) (LRB): RIGHT TOTAL KNEE ARTHROPLASTY WITH SAPHENOUS NERVE SCAR EXCISION (Right)   Patient reports pain as mild, pain controlled. Having some knee pain, but can already tell that the pain from the saphenous nerve has resolved.  Objective:   VITALS:   Filed Vitals:   08/09/13 0541  BP: 143/78  Pulse: 89  Temp: 98.6 F (37 C)  Resp: 18    Neurovascular intact Dorsiflexion/Plantar flexion intact Incision: dressing C/D/I No cellulitis present Compartment soft  LABS  Recent Labs  08/09/13 0425  HGB 12.3  HCT 36.3  WBC 14.7*  PLT 369     Recent Labs  08/09/13 0425  NA 141  K 4.2  BUN 15  CREATININE 0.77  GLUCOSE 144*     Assessment/Plan: 1 Day Post-Op Procedure(s) (LRB): RIGHT TOTAL KNEE ARTHROPLASTY WITH SAPHENOUS NERVE SCAR EXCISION (Right) Foley cath d/c'ed Advance diet Up with therapy D/C IV fluids Discharge home with home health, if she does well with PT and pain controlled. Follow up in 2 weeks at [facility]. Follow up with OLIN,Filicia Scogin D in 2 weeks.  Contact information:  [facility] [address] [place] [address] [phone]    Morbid Obesity (BMI >40)  Estimated body mass index is 42.43 kg/(m^2) as calculated from the following:   Height as of this encounter: 5\' 5"  (1.651 m).   Weight as of this encounter: 115.667 kg (255 lb). Patient also counseled that weight may inhibit the healing process Patient counseled that losing weight will help with future health issues        West Pugh. Karrington Studnicka   PAC  08/09/2013, 10:05 AM

## 2013-08-09 NOTE — Evaluation (Signed)
Physical Therapy Evaluation Patient Details Name: Connie Haynes MRN: [id number] DOB: [birth year] Today's Date: 08/09/2013   History of Present Illness  RTKA  Clinical Impression  Pt tolerated ambulation well. Will see in PM for steps and decide in DC . Pt having considerable pain, just had meds. Pt will  Benefit from PT to address problems listed in note.    Follow Up Recommendations Home health PT;Supervision/Assistance - 24 hour    Equipment Recommendations  None recommended by PT    Recommendations for Other Services       Precautions / Restrictions Precautions Precautions: Knee      Mobility  Bed Mobility Overal bed mobility: Needs Assistance Bed Mobility: Supine to Sit     Supine to sit: Min guard     General bed mobility comments: for R leg but pt is able to manage R leg to floor  Transfers Overall transfer level: Needs assistance Equipment used: Rolling walker (2 wheeled) Transfers: Sit to/from Stand Sit to Stand: Min guard;From elevated surface         General transfer comment: cues for technique, hand placement  Ambulation/Gait Ambulation/Gait assistance: Min guard Ambulation Distance (Feet): 50 Feet Assistive device: Rolling walker (2 wheeled) Gait Pattern/deviations: Step-to pattern;Antalgic     General Gait Details: cues for sequence  Stairs            Wheelchair Mobility    Modified Rankin (Stroke Patients Only)       Balance                                             Pertinent Vitals/Pain 4-6 ice applied, premedicated.    Home Living Family/patient expects to be discharged to:: Private residence Living Arrangements: Spouse/significant other Available Help at Discharge: Family Type of Home: House Home Access: Stairs to enter Entrance Stairs-Rails: None Entrance Stairs-Number of Steps: 2 Home Layout: Two level;Able to live on main level with bedroom/bathroom Home Equipment: Gilford Rile - 2 wheels;Bedside  commode      Prior Function Level of Independence: Independent               Hand Dominance        Extremity/Trunk Assessment   Upper Extremity Assessment: Defer to OT evaluation           Lower Extremity Assessment: RLE deficits/detail RLE Deficits / Details: able to perform SLR       Communication   Communication: No difficulties  Cognition Arousal/Alertness: Awake/alert Behavior During Therapy: WFL for tasks assessed/performed Overall Cognitive Status: Within Functional Limits for tasks assessed                      General Comments      Exercises Total Joint Exercises Ankle Circles/Pumps: AROM;Both;10 reps;Supine Quad Sets: AROM;Right;10 reps;Supine Heel Slides: AAROM;Right;10 reps;Supine Hip ABduction/ADduction: AROM;Right;10 reps;Supine Straight Leg Raises: AAROM;Right;10 reps;Supine Goniometric ROM: 5-50      Assessment/Plan    PT Assessment Patient needs continued PT services  PT Diagnosis Difficulty walking;Acute pain   PT Problem List Decreased strength;Decreased range of motion;Decreased activity tolerance;Decreased mobility;Pain  PT Treatment Interventions DME instruction;Gait training;Stair training;Functional mobility training;Therapeutic activities;Therapeutic exercise;Patient/family education   PT Goals (Current goals can be found in the Care Plan section) Acute Rehab PT Goals Patient Stated Goal: to go home PT Goal Formulation: With patient Time For Goal Achievement: 08/11/13 Potential  to Achieve Goals: Good    Frequency 7X/week   Barriers to discharge        Co-evaluation               End of Session   Activity Tolerance: Patient tolerated treatment well Patient left: in chair;with call bell/phone within reach Nurse Communication: Mobility status         Time: 0835-0900 PT Time Calculation (min): 25 min   Charges:   PT Evaluation $Initial PT Evaluation Tier I: 1 Procedure PT Treatments $Gait  Training: 8-22 mins $Therapeutic Exercise: 8-22 mins   PT G Codes:          Claretha Cooper 08/09/2013, 9:15 AM Tresa Endo PT [phone]

## 2013-08-09 NOTE — Progress Notes (Signed)
[facility] is providing the following services: Patient declined rw and commode - has both at home already.   If patient discharges after hours, please call [phone].   Linward Headland 08/09/2013, 11:03 AM

## 2013-08-09 NOTE — Progress Notes (Signed)
Physical Therapy Treatment Patient Details Name: BAYLER NEHRING MRN: [id number] DOB: [birth year] Today's Date: 08/09/2013    History of Present Illness RTKA    PT Comments    Ready for DC, instructions complete.  Follow Up Recommendations  Home health PT;Supervision/Assistance - 24 hour     Equipment Recommendations       Recommendations for Other Services       Precautions / Restrictions Precautions Precautions: Knee    Mobility  Bed Mobility   Bed Mobility: Sit to Supine       Sit to supine: Min guard      Transfers Overall transfer level: Needs assistance Equipment used: Rolling walker (2 wheeled) Transfers: Sit to/from Stand Sit to Stand: Min guard         General transfer comment: cues for technique, hand placement  Ambulation/Gait Ambulation/Gait assistance: Min guard Ambulation Distance (Feet): 50 Feet Assistive device: Rolling walker (2 wheeled) Gait Pattern/deviations: Step-through pattern     General Gait Details: cues for sequence   Stairs Stairs: Yes Stairs assistance: Min assist Stair Management: No rails;Step to pattern;Backwards;With walker Number of Stairs: 2 General stair comments: family present for instruction  Wheelchair Mobility    Modified Rankin (Stroke Patients Only)       Balance                                    Cognition Arousal/Alertness: Awake/alert                          Exercises      General Comments        Pertinent Vitals/Pain 3  R knee    Home Living                      Prior Function            PT Goals (current goals can now be found in the care plan section) Progress towards PT goals: Progressing toward goals    Frequency       PT Plan Current plan remains appropriate    Co-evaluation             End of Session   Activity Tolerance: Patient tolerated treatment well Patient left: in bed;with call bell/phone within reach;with  family/visitor present     Time: 9678-9381 PT Time Calculation (min): 21 min  Charges:  $Gait Training: 8-22 mins                    G Codes:      Claretha Cooper 08/09/2013, 4:09 PM

## 2013-08-11 NOTE — Brief Op Note (Signed)
08/08/2013  8:45 AM  PATIENT:  Connie Haynes  58 y.o. female  PRE-OPERATIVE DIAGNOSIS:  1.  RIGHT KNEE OSTEOARTHRITIS, 2. Painful saphenous neuroma  POST-OPERATIVE DIAGNOSIS:  1.  RIGHT KNEE OSTEOARTHRITIS, 2. Painful saphenous neuroma  PROCEDURE:  Procedure(s): 1. RIGHT TOTAL KNEE ARTHROPLASTY - Templated operative note in system  2. SAPHENOUS NERVE SCAR EXCISION (Right) - dictated  SURGEON:  Surgeon(s) and Role:    * Mauri Pole, MD - Primary  PHYSICIAN ASSISTANT: Danae Orleans, PA-C   ANESTHESIA:   spinal  EBL:  Total I/O In: 1 [P.O.:480] Out: -   BLOOD ADMINISTERED:none  DRAINS: none   LOCAL MEDICATIONS USED:  Exparel/Marcaine combination  SPECIMEN:  No Specimen  DISPOSITION OF SPECIMEN:  N/A  COUNTS:  YES  TOURNIQUET:   Total Tourniquet Time Documented: Thigh (Right) - 41 minutes Total: Thigh (Right) - 41 minutes   DICTATION: .Other Dictation: Dictation Number [id number]  PLAN OF CARE: Admit to inpatient   PATIENT DISPOSITION:  PACU - hemodynamically stable.   Delay start of Pharmacological VTE agent (>24hrs) due to surgical blood loss or risk of bleeding: no

## 2013-08-11 NOTE — Op Note (Signed)
NAME:  Connie Haynes                      MEDICAL RECORD NO.:  [id number]                             FACILITY:  [facility]      PHYSICIAN:  Pietro Cassis. Alvan Dame, M.D.  DATE OF BIRTH:  [birth year]      DATE OF PROCEDURE:  6/30//2015                                     OPERATIVE REPORT         PREOPERATIVE DIAGNOSIS:  Right knee osteoarthritis.      POSTOPERATIVE DIAGNOSIS:  Right knee osteoarthritis.      FINDINGS:  The patient was noted to have complete loss of cartilage and   bone-on-bone arthritis with associated osteophytes in the medial and patellofemoral compartments of   the knee with a significant synovitis and associated effusion.      PROCEDURE:  Right total knee replacement.      COMPONENTS USED:  DePuy Attune rotating platform posterior stabilized knee   system, a size 5 femur, 6 tibia, 10 mm PS AOX insert, and 35 anatomic patellar   button.      SURGEON:  Pietro Cassis. Alvan Dame, M.D.      ASSISTANT:  Danae Orleans, PA-C.      ANESTHESIA:  Spinal.      SPECIMENS:  None.      COMPLICATION:  None.      DRAINS:  One Hemovac.  EBL: <100      TOURNIQUET TIME:   Total Tourniquet Time Documented: Thigh (Right) - 41 minutes Total: Thigh (Right) - 41 minutes  .      The patient was stable to the recovery room.      INDICATION FOR PROCEDURE:  Connie Haynes is a 58 y.o. female patient of   mine.  The patient had been seen, evaluated, and treated conservatively in the   office with medication, activity modification, and injections.  The patient had   radiographic changes of bone-on-bone arthritis with endplate sclerosis and osteophytes noted.      The patient failed conservative measures including medication, injections, and activity modification, and at this point was ready for more definitive measures.   Based on the radiographic changes and failed conservative measures, the patient   decided to proceed with total knee replacement.  Risks of infection,   DVT, component failure,  need for revision surgery, postop course, and   expectations were all   discussed and reviewed.  Consent was obtained for benefit of pain   relief.      PROCEDURE IN DETAIL:  The patient was brought to the operative theater.   Once adequate anesthesia, preoperative antibiotics, 2 gm of Ancef administered, the patient was positioned supine with the right thigh tourniquet placed.  The  right lower extremity was prepped and draped in sterile fashion.  A time-   out was performed identifying the patient, planned procedure, and   extremity.      The right lower extremity was placed in the [facility] leg holder.  The leg was   exsanguinated, tourniquet elevated to 250 mmHg.  A midline incision was   made followed by median parapatellar arthrotomy.  Following initial  exposure, attention was first directed to the patella.  Precut   measurement was noted to be 22 mm.  I resected down to 14 mm and used a   35 patellar button to restore patellar height as well as cover the cut   surface.      The lug holes were drilled and a metal shim was placed to protect the   patella from retractors and saw blades.      At this point, attention was now directed to the femur.  The femoral   canal was opened with a drill, irrigated to try to prevent fat emboli.  An   intramedullary rod was passed at 3 degrees valgus, 9 mm of bone was   resected off the distal femur.  Following this resection, the tibia was   subluxated anteriorly.  Using the extramedullary guide, 4 mm of bone was resected off   the proximal medially tibia.  We confirmed the gap would be   stable medially and laterally with a size 6 mm insert as well as confirmed   the cut was perpendicular in the coronal plane, checking with an alignment rod.      Once this was done, I sized the femur to be a size 5 in the anterior-   posterior dimension, chose a standard component based on medial and   lateral dimension.  The size 5 rotation block was then  pinned in   position anterior referenced using the tensioning device to set rotation and assist with balancing her flexion and extension gaps.  The   anterior, posterior, and  chamfer cuts were made without difficulty nor   notching making certain that I was along the anterior cortex to help   with flexion gap stability.      The final box cut was made off the lateral aspect of distal femur.      At this point, the tibia was sized to be a size 6, the size 6 tray was   then pinned in position through the medial third of the tubercle,   drilled, and keel punched.  Trial reduction was now carried with a 5 femur,  6 tibia, a 8 then the 36mm insert, and the 35 patella botton.  The knee was brought to   extension, full extension with good flexion stability with the patella   tracking through the trochlea without application of pressure.  Given   all these findings, the trial components removed.  Final components were   opened and cement was mixed.  The knee was irrigated with normal saline   solution and pulse lavage.  The synovial lining was   then injected with 20cc of Exparel, 30cc of 0.25% Marcaine with epinephrine and 1 cc of Toradol,   total of 61 cc.      The knee was irrigated.  Final implants were then cemented onto clean and   dried cut surfaces of bone with the knee brought to extension with a 10 mm trial insert.      Once the cement had fully cured, the excess cement was removed   throughout the knee.  I confirmed I was satisfied with the range of   motion and stability, and the final 10 mm PS AOX insert was chosen.  It was   placed into the knee.      The tourniquet had been let down at 40 minutes.  No significant   hemostasis required.  The   extensor mechanism was then  reapproximated using #1 Vicryl with the knee   in flexion.  The   remaining wound was closed with 2-0 Vicryl and running 4-0 Monocryl.   The knee was cleaned, dried, dressed sterilely using Dermabond and    Aquacel dressing.  The patient was then   brought to recovery room in stable condition, tolerating the procedure   well.   Please note that Physician Assistant, Danae Orleans, PA-C, was present for the entirety of the case, and was utilized for pre-operative positioning, peri-operative retractor management, general facilitation of the procedure.  He was also utilized for primary wound closure at the end of the case.              Pietro Cassis Alvan Dame, M.D.    08/11/2013 8:53 AM

## 2013-08-11 NOTE — Op Note (Signed)
NAMECHRISANNE, LOOSE                  ACCOUNT NO.:  [id number]  MEDICAL RECORD NO.:  [id number]  LOCATION:  4034                         FACILITY:  [facility]  PHYSICIAN:  Pietro Cassis. Alvan Dame, M.D.  DATE OF BIRTH:  Oct 03, 1955  DATE OF PROCEDURE:  08/08/2013 DATE OF DISCHARGE:  08/09/2013                              OPERATIVE REPORT   PREOPERATIVE DIAGNOSES: 1. Right knee advanced osteoarthritis. 2. Painful right saphenous nerve neuroma.  POSTOPERATIVE DIAGNOSES: 1. Right knee advanced osteoarthritis. 2. Painful right saphenous nerve neuroma.  PROCEDURE: 1. Right total knee arthroplasty.  Please note that this operative     note in her chart as a templated operative note for total knee     arthroplasty. 2. Excision of painful saphenous neuroma dictated under this report.  SURGEON:  Pietro Cassis. Alvan Dame, M.D.  ASSISTANT:  Danae Orleans, PA-C.  Note that, Mr. Guinevere Scarlet was present for the entirety of these 2 cases from preoperative positioning, perioperative management of upper extremity, general facilitation of the case, and primary wound closure.  ANESTHESIA:  Spinal.  SPECIMENS:  None.  COMPLICATION:  None.  DRAINS:  None.  TOURNIQUET TIME:  Per anesthetic record at 40-41 minutes.  INDICATIONS FOR PROCEDURE:  [name] is a pleasant 58 year old female, who I have been following for some time with her right knee.  She had undergone arthroscopically attempted partial meniscectomy, management of her knee in a conservative fashion, but has had persistent and progressive pain.  In the office, we have been trying through differential injections to try to differentiate whether her pain is related to painful saphenous nerve versus intra-articular progression of degenerative changes.  Though she gets some relief with a temporary block of her saphenous nerve, she felt that she got the most relief with an intra-articular block indicating that to me and to her that arthritis and progressive  degenerative changes were reflecting her as much as the saphenous nerve was if not more.  After lengthy discussion and working through this, we felt that the best option at this point was to proceed with total knee arthroplasty with the same time attempt to excise the saphenous nerve and the scarring of it at the site of her previous arthroscopic portal inferior and medial portion of the knee.  Risks, benefits, and necessity of all procedures were discussed and reviewed and consent was also obtained for benefit of pain relief.  PROCEDURE IN DETAIL:  The patient was brought to operative theater. Once adequate anesthesia, preoperative antibiotics administered.  She was positioned supine.  A right thigh tourniquet was placed.  The right lower extremity was then prepped and draped in a sterile fashion.  A time-out was performed identifying the patient, planned procedure, and the extremity.  The 1st portion of the case was directed at the saphenous neuroma excision.  A midline incision was made for my knee arthroplasty.  Soft tissue planes were created, allowing for exposure of this medial aspect of the knee as I had routinely perform for the saphenous neurectomies particularly when there is significant amount of adipose tissue in the skin area, the nerve itself may be less readily available or less readily visualized.  I had  preoperatively marked on her skin the locations of her hypersensitivity and thus sharply excised the fat in the subcutaneous layer, and then doing so, I was able to identify the tract of the saphenous nerve as it went to the medial aspect of her leg and in the coronal plane.  I was able to apply some tension and then sharply excise the nerve branch to allow for retraction of the saphenous nerve more medially.  Upon completion of this, I have briefly washed this area and then proceeded to total knee arthroplasty.  Note again, that the total knee arthroplasty is under  a separate operative note in the system on templates.     Pietro Cassis Alvan Dame, M.D.     MDO/MEDQ  D:  08/11/2013  T:  08/11/2013  Job:  [id number]

## 2013-09-12 ENCOUNTER — Encounter: Payer: Self-pay | Admitting: Family Medicine

## 2013-09-12 ENCOUNTER — Ambulatory Visit (INDEPENDENT_AMBULATORY_CARE_PROVIDER_SITE_OTHER): Payer: BC Managed Care – PPO | Admitting: Family Medicine

## 2013-09-12 VITALS — BP 137/87 | HR 87 | Resp 16

## 2013-09-12 DIAGNOSIS — F411 Generalized anxiety disorder: Secondary | ICD-10-CM

## 2013-09-12 DIAGNOSIS — G894 Chronic pain syndrome: Secondary | ICD-10-CM

## 2013-09-12 DIAGNOSIS — I456 Pre-excitation syndrome: Secondary | ICD-10-CM

## 2013-09-12 DIAGNOSIS — G4700 Insomnia, unspecified: Secondary | ICD-10-CM

## 2013-09-12 DIAGNOSIS — K5900 Constipation, unspecified: Secondary | ICD-10-CM

## 2013-09-12 DIAGNOSIS — G2581 Restless legs syndrome: Secondary | ICD-10-CM

## 2013-09-12 DIAGNOSIS — I10 Essential (primary) hypertension: Secondary | ICD-10-CM

## 2013-09-12 DIAGNOSIS — R110 Nausea: Secondary | ICD-10-CM

## 2013-09-12 MED ORDER — ROPINIROLE HCL 1 MG PO TABS: 1.0000 mg | ORAL_TABLET | Freq: Every day | ORAL | Status: AC

## 2013-09-12 MED ORDER — TRAZODONE HCL 50 MG PO TABS: 50.0000 mg | ORAL_TABLET | Freq: Every day | ORAL | Status: DC

## 2013-09-12 MED ORDER — LOSARTAN POTASSIUM-HCTZ 100-25 MG PO TABS: 1.0000 | ORAL_TABLET | Freq: Every day | ORAL | Status: DC

## 2013-09-12 MED ORDER — CELECOXIB 200 MG PO CAPS: 200.0000 mg | ORAL_CAPSULE | Freq: Every day | ORAL | Status: DC

## 2013-09-12 MED ORDER — PROMETHAZINE HCL 25 MG PO TABS: ORAL_TABLET | ORAL | Status: DC

## 2013-09-12 MED ORDER — LUBIPROSTONE 24 MCG PO CAPS: 24.0000 ug | ORAL_CAPSULE | Freq: Two times a day (BID) | ORAL | Status: AC

## 2013-09-12 MED ORDER — SOTALOL HCL (AF) 80 MG PO TABS: 1.0000 | ORAL_TABLET | Freq: Two times a day (BID) | ORAL | Status: DC

## 2013-09-12 MED ORDER — DULOXETINE HCL 60 MG PO CPEP: 60.0000 mg | ORAL_CAPSULE | Freq: Every morning | ORAL | Status: AC

## 2013-09-12 NOTE — Progress Notes (Signed)
Subjective:    Patient ID: Connie Haynes, female    DOB: 1955/03/04, 58 y.o.   MRN: [id number]  HPI  Connie Haynes is here today to discuss the following conditions and to get refills for several of her medications.    1)  Hypertension:  She is taking the Hyzaar however she feels that it is not getting her blood pressure under control.   2)  IBS:  She is needing to get a refill on her Amitza.   3)  Anxiety:  She is taking the Cymbalta. She would like to get refills. She also wants the clonazepam refilled.  4)  Sleep Disturbanace:  She is having a hard time sleeping at night. She would like to get something to help her rest.   5)  Restless Leg:  She is doing well on the Requip. She needs a refill. She also needs a refill on the Robaxin and the Celebrex.   Review of Systems  Constitutional: Negative for activity change, appetite change and fatigue.  Cardiovascular: Negative for chest pain, palpitations and leg swelling.  Gastrointestinal: Negative for diarrhea and constipation.  Musculoskeletal: Positive for arthralgias, joint swelling and myalgias.  Psychiatric/Behavioral: Positive for sleep disturbance. The patient is nervous/anxious.   All other systems reviewed and are negative.    Past Medical History  Diagnosis Date  . Wolff-Parkinson-White (WPW) syndrome   . Hypertension   . Hyperlipidemia   . IBS (irritable bowel syndrome)     controlled with med  . Kidney stones   . PONV (postoperative nausea and vomiting)     " woke up to soon"  . Cough   . Depression   . Disorder of tear duct system     frequent tearing of eyes  . Osteoarthritis     arthritis -joints-limited ROM shoulders more on right     Past Surgical History  Procedure Laterality Date  . Tear duct surgery    . Cervical spine surgery      cervical fusion with hardware retained  . Total abdominal hysterectomy    . Total hip arthroplasty      x 3(rt x2, lt x1)  . Ablation surgery      1993 [facility] (Dr.  Hulen Skains)  . Eyelid carcinoma excision    . Joint replacement    . Cervical spine surgery    . Total shoulder arthroplasty Left 07/15/2012    Procedure: LEFT TOTAL SHOULDER ARTHROPLASTY;  Surgeon: Augustin Schooling, MD;  Location: [facility];  Service: Orthopedics;  Laterality: Left;  . Colon surgery      "colon detached from abdomen"  . Ablation coronary      '93 for WPW syndrome  . 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 Leukemia Brother   . Heart disease Mother   .  Hypertension Mother   . Hyperlipidemia Mother   . Heart disease Maternal Grandfather   . Cancer Paternal Grandfather   . Lung cancer Paternal Grandfather   . Cancer Maternal Uncle     bone     Current Outpatient Prescriptions on File Prior to Visit  Medication Sig Dispense Refill  . Cholecalciferol (VITAMIN D) 1000 UNITS capsule Take 1,000 Units by mouth daily.       Marland Kitchen losartan-hydrochlorothiazide (HYZAAR) 100-12.5 MG per tablet Take 1 tablet by mouth every morning.      . methocarbamol (ROBAXIN) 500 MG tablet Take 1 tablet (500 mg total) by mouth every 6 (six) hours as needed for muscle spasms.  50 tablet  0  . Multiple Vitamin (MULTIVITAMIN) tablet Take 1 tablet by mouth daily.       No current facility-administered medications on file prior to visit.     Allergies  Allergen Reactions  . Codeine     vomiting     Immunization History    Administered Date(s) Administered  . Influenza Whole 11/10/2010  . Influenza,inj,Quad PF,36+ Mos 10/04/2012  . Tdap 05/06/2005       Objective:   Physical Exam  Nursing note and vitals reviewed. Constitutional: She is oriented to person, place, and time. She appears well-developed and well-nourished.  Cardiovascular: Normal rate and regular rhythm.   Pulmonary/Chest: Effort normal and breath sounds normal.  Neurological: She is alert and oriented to person, place, and time.  Skin: Skin is warm and dry.  Psychiatric: She has a normal mood and affect.      Assessment & Plan:    Reganne was seen today for medication management and medical management of chronic issues.  Diagnoses and associated orders for this visit:  Essential hypertension, benign Comments: Her blood pressure is on the high end of normal.  We'll add a beta blocker to help control her heartrate which will lower her BP.   - losartan-hydrochlorothiazide (HYZAAR) 100-25 MG per tablet; Take 1 tablet by mouth daily.  Restless leg syndrome - rOPINIRole (REQUIP) 1 MG tablet; Take 1 tablet (1 mg total) by mouth at bedtime.  Anxiety state, unspecified - DULoxetine (CYMBALTA) 60 MG capsule; Take 1 capsule (60 mg total) by mouth every morning.  Chronic pain syndrome - DULoxetine (CYMBALTA) 60 MG capsule; Take 1 capsule (60 mg total) by mouth every morning. - celecoxib (CELEBREX) 200 MG capsule; Take 1 capsule (200 mg total) by mouth daily.  WPW (Wolff-Parkinson-White syndrome) Comments: She has been having some new cardiac symptoms recently (chest pain/palpitations).  We'll start her on sotalol and refer her to Dr. Caryl Comes for further evaluation. - Ambulatory referral to Cardiology - Discontinue: SOTALOL AF 80 MG TABS; Take 1 tablet (80 mg total) by mouth 2 (two) times daily.  Insomnia - Discontinue: traZODone (DESYREL) 50 MG tablet; Take 1 tablet (50 mg total) by mouth at bedtime.  Nausea alone - promethazine  (PHENERGAN) 25 MG tablet; Take 1 tab po at bedtime and then q 6 hrs as needed for nausea vomiting  Unspecified constipation - lubiprostone (AMITIZA) 24 MCG capsule; Take 1 capsule (24 mcg total) by mouth 2 (two) times daily with a meal.   TIME SPENT "FACE TO FACE" WITH PATIENT -  30 MINS

## 2013-09-12 NOTE — Patient Instructions (Addendum)
1)  BP/WPW - You are going to see Dr. Caryl Comes on 8/20 at 2:30.  He is located at their [facility] location. We are going to start you on a new medication called Sotalol that you will take twice a day.   2)  Sleep - Try the trazodone at bedtime    Insomnia Insomnia is frequent trouble falling and/or staying asleep. Insomnia can be a long term problem or a short term problem. Both are common. Insomnia can be a short term problem when the wakefulness is related to a certain stress or worry. Long term insomnia is often related to ongoing stress during waking hours and/or poor sleeping habits. Overtime, sleep deprivation itself can make the problem worse. Every little thing feels more severe because you are overtired and your ability to cope is decreased. CAUSES   Stress, anxiety, and depression.  Poor sleeping habits.  Distractions such as TV in the bedroom.  Naps close to bedtime.  Engaging in emotionally charged conversations before bed.  Technical reading before sleep.  Alcohol and other sedatives. They may make the problem worse. They can hurt normal sleep patterns and normal dream activity.  Stimulants such as caffeine for several hours prior to bedtime.  Pain syndromes and shortness of breath can cause insomnia.  Exercise late at night.  Changing time zones may cause sleeping problems (jet lag). It is sometimes helpful to have someone observe your sleeping patterns. They should look for periods of not breathing during the night (sleep apnea). They should also look to see how long those periods last. If you live alone or observers are uncertain, you can also be observed at a sleep clinic where your sleep patterns will be professionally monitored. Sleep apnea requires a checkup and treatment. Give your caregivers your medical history. Give your caregivers observations your family has made about your sleep.  SYMPTOMS   Not feeling rested in the morning.  Anxiety and restlessness at  bedtime.  Difficulty falling and staying asleep. TREATMENT   Your caregiver may prescribe treatment for an underlying medical disorders. Your caregiver can give advice or help if you are using alcohol or other drugs for self-medication. Treatment of underlying problems will usually eliminate insomnia problems.  Medications can be prescribed for short time use. They are generally not recommended for lengthy use.  Over-the-counter sleep medicines are not recommended for lengthy use. They can be habit forming.  You can promote easier sleeping by making lifestyle changes such as:  Using relaxation techniques that help with breathing and reduce muscle tension.  Exercising earlier in the day.  Changing your diet and the time of your last meal. No night time snacks.  Establish a regular time to go to bed.  Counseling can help with stressful problems and worry.  Soothing music and white noise may be helpful if there are background noises you cannot remove.  Stop tedious detailed work at least one hour before bedtime. HOME CARE INSTRUCTIONS   Keep a diary. Inform your caregiver about your progress. This includes any medication side effects. See your caregiver regularly. Take note of:  Times when you are asleep.  Times when you are awake during the night.  The quality of your sleep.  How you feel the next day. This information will help your caregiver care for you.  Get out of bed if you are still awake after 15 minutes. Read or do some quiet activity. Keep the lights down. Wait until you feel sleepy and go back to  bed.  Keep regular sleeping and waking hours. Avoid naps.  Exercise regularly.  Avoid distractions at bedtime. Distractions include watching television or engaging in any intense or detailed activity like attempting to balance the household checkbook.  Develop a bedtime ritual. Keep a familiar routine of bathing, brushing your teeth, climbing into bed at the same time  each night, listening to soothing music. Routines increase the success of falling to sleep faster.  Use relaxation techniques. This can be using breathing and muscle tension release routines. It can also include visualizing peaceful scenes. You can also help control troubling or intruding thoughts by keeping your mind occupied with boring or repetitive thoughts like the old concept of counting sheep. You can make it more creative like imagining planting one beautiful flower after another in your backyard garden.  During your day, work to eliminate stress. When this is not possible use some of the previous suggestions to help reduce the anxiety that accompanies stressful situations. MAKE SURE YOU:   Understand these instructions.  Will watch your condition.  Will get help right away if you are not doing well or get worse. Document Released: 01/24/2000 Document Revised: 04/20/2011 Document Reviewed: 02/23/2007 [facility] Patient Information 2015 [place], [place]. This information is not intended to replace advice given to you by your health care provider. Make sure you discuss any questions you have with your health care provider.

## 2013-09-28 ENCOUNTER — Ambulatory Visit (INDEPENDENT_AMBULATORY_CARE_PROVIDER_SITE_OTHER): Payer: BC Managed Care – PPO | Admitting: Internal Medicine

## 2013-09-28 VITALS — BP 140/82 | HR 79 | Ht 66.0 in | Wt 242.0 lb

## 2013-09-28 DIAGNOSIS — R400 Somnolence: Secondary | ICD-10-CM

## 2013-09-28 DIAGNOSIS — R079 Chest pain, unspecified: Secondary | ICD-10-CM

## 2013-09-28 DIAGNOSIS — R404 Transient alteration of awareness: Secondary | ICD-10-CM

## 2013-09-28 MED ORDER — CARVEDILOL 6.25 MG PO TABS: 6.2500 mg | ORAL_TABLET | Freq: Two times a day (BID) | ORAL | Status: DC

## 2013-09-28 NOTE — Progress Notes (Signed)
ELECTROPHYSIOLOGY CONSULT NOTE  Patient ID: Connie Haynes, MRN: [id number], DOB/AGE: [birth year] 58 y.o. Admit date: (Not on file) Date of Consult: 09/28/2013  Primary Physician: Ival Bible, MD Primary Cardiologist: New  Chief Complaint: palpiat6ions and chest pain     HPI Connie Haynes is a 58 y.o. female   Referred because of a history of WPW now with symptoms of palpitations and chest pain.  She has noted over her recent surgery to have significant perioperative hypertension. She is noted more recently predictably inducible chest discomfort described as pressure in the chest radiating to back and left shoulder associated with diaphoresis nausea and shortness of breath. Typically resolves in 15-60 minutes. It is also   associated with her awaking  at night; A2 last about 30-60 minutes. He has noticed a brackish taste   cardiac risk factors include hypertension dyslipidemia and family history. She does not smoke.   She has sleep disorder breathing and daytime somnolence; nocturnal oxygen saturation monitoring demonstrated low oxygen levels  She has 2 distinct palpitation syndromes. The first is that she is noted her 80 and heart rate will wean into the 80-110 range were for many years and had been in the 70 range. Apparently her thyroid studies and blood count recently have been normal.  She also has spells where she feels her heart racing in the 120-150 range this can last minutes. Sometimes it feels as if its irregular. 1   Past Medical History  Diagnosis Date  . Wolff-Parkinson-White (WPW) syndrome   . Hypertension   . Hyperlipidemia   . IBS (irritable bowel syndrome)     controlled with med  . Kidney stones   . PONV (postoperative nausea and vomiting)     " woke up to soon"  . Cough   . Depression   . Disorder of tear duct system     frequent tearing of eyes  . Osteoarthritis     arthritis -joints-limited ROM shoulders more on right      Surgical History:    Past Surgical History  Procedure Laterality Date  . Tear duct surgery    . Cervical spine surgery      cervical fusion with hardware retained  . Total abdominal hysterectomy    . Total hip arthroplasty      x 3(rt x2, lt x1)  . Ablation surgery      1993 [facility] (Dr. Hulen Skains)  . Eyelid carcinoma excision    . Joint replacement    . Cervical spine surgery    . Total shoulder arthroplasty Left 07/15/2012    Procedure: LEFT TOTAL SHOULDER ARTHROPLASTY;  Surgeon: Augustin Schooling, MD;  Location: [facility];  Service: Orthopedics;  Laterality: Left;  . Colon surgery      "colon detached from abdomen"  . Ablation coronary      '93 for WPW syndrome  . Total knee arthroplasty Right 08/08/2013    Procedure: RIGHT TOTAL KNEE ARTHROPLASTY WITH SAPHENOUS NERVE SCAR EXCISION;  Surgeon: Mauri Pole, MD;  Location: [facility];  Service: Orthopedics;  Laterality: Right;     Home Meds: Prior to Admission medications   Medication Sig Start Date End Date Taking? Authorizing Provider  aspirin 81 MG tablet Take 81 mg by mouth daily.   Yes Historical Provider, MD  celecoxib (CELEBREX) 200 MG capsule Take 1 capsule (200 mg total) by mouth daily. 09/12/13 09/13/14 Yes [name], MD  Cholecalciferol (VITAMIN D) 1000 UNITS capsule Take 1,000 Units by  mouth daily.    Yes Historical Provider, MD  DULoxetine (CYMBALTA) 60 MG capsule Take 1 capsule (60 mg total) by mouth every morning. 09/12/13 09/13/14 Yes [name], MD  losartan-hydrochlorothiazide (HYZAAR) 100-12.5 MG per tablet Take 1 tablet by mouth every morning.   Yes Historical Provider, MD  lubiprostone (AMITIZA) 24 MCG capsule Take 1 capsule (24 mcg total) by mouth 2 (two) times daily with a meal. 09/12/13 09/13/14 Yes [name], MD  methocarbamol (ROBAXIN) 500 MG tablet Take 1 tablet (500 mg total) by mouth every 6 (six) hours as needed for muscle spasms. 08/09/13  Yes Lucille Passy Babish, PA-C  Multiple Vitamin (MULTIVITAMIN) tablet Take 1 tablet by mouth  daily.   Yes Historical Provider, MD  promethazine (PHENERGAN) 25 MG tablet Take 1 tab po at bedtime and then q 6 hrs as needed for nausea vomiting 09/12/13 09/13/14 Yes [name], MD  rOPINIRole (REQUIP) 1 MG tablet Take 1 tablet (1 mg total) by mouth at bedtime. 09/12/13 09/13/14 Yes [name], MD     Allergies:  Allergies  Allergen Reactions  . Codeine     vomiting    History   Social History  . Marital Status: Married    Spouse Name: Toy Baker    Number of Children: N/A  . Years of Education: N/A   Occupational History  . [employer]   Social History Main Topics  . Smoking status: Never Smoker   . Smokeless tobacco: Never Used  . Alcohol Use: No  . Drug Use: No  . Sexual Activity: Yes    Partners: Male   Other Topics Concern  . 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 Leukemia Brother   . Heart disease Mother   . Hypertension Mother   . Hyperlipidemia Mother   . Heart disease Maternal Grandfather   . Cancer Paternal Grandfather   . Lung cancer Paternal Grandfather   . Cancer Maternal Uncle     bone     ROS:  Please see the history of present illness.     All other systems reviewed and negative.    Physical Exam:   Blood pressure 140/82, pulse 79, height 5\' 6"  (1.676 m), weight 242 lb (109.77 kg). General: Well developed, well nourished female in no acute distress. Head: Normocephalic, atraumatic, sclera non-icteric, no xanthomas, nares are without discharge. EENT: normal Lymph Nodes:  none Back: without scoliosis/kyphosis*  no CVA tendersness Neck: Negative for carotid  bruits. JVD not elevated. Lungs: Clear bilaterally to auscultation without wheezes, rales, or rhonchi. Breathing is unlabored. Heart: RRR with a  Loud S1 S2. No  murmur , rubs, or gallops appreciated. Abdomen: Soft, non-tender, non-distended with normoactive bowel sounds. No hepatomegaly. No rebound/guarding. No obvious abdominal masses. Msk:  Strength and tone appear normal for age. Extremities: No clubbing or cyanosis. tredema.  Distal pedal pulses are 2+ and equal bilaterally. Skin: Warm and Dry Neuro: Alert and oriented X 3. CN III-XII intact Grossly  normal sensory and motor function . Psych:  Responds to questions appropriately with a normal affect.      Labs: Cardiac Enzymes No results found for this basename: CKTOTAL, CKMB, TROPONINI,  in the last 72 hours CBC Lab Results  Component Value Date   WBC 14.7* 08/09/2013   HGB 12.3 08/09/2013   HCT 36.3 08/09/2013   MCV 90.5 08/09/2013   PLT 369 08/09/2013   PROTIME: No results found for this basename: LABPROT, INR,  in the last 72 hours Chemistry No results found for this basename: NA, K, CL, CO2, BUN, CREATININE, CALCIUM, LABALBU, PROT, BILITOT, ALKPHOS, ALT, AST, GLUCOSE,  in the last 168 hours Lipids Lab Results  Component Value Date   CHOL 217* 05/23/2012   HDL 66 05/23/2012   LDLCALC 136* 05/23/2012   TRIG 77 05/23/2012   BNP No results found for this basename: probnp   Miscellaneous No results found for this basename: DDIMER    Radiology/Studies:  No results found.  EKG: NSR 80 Gen. 01/18/36   Assessment and Plan:  Palpitations  Hypertension  Chest pain-atypical  Sleep-disordered breathing  Patient has a remote history of ablation for WPW. Her recurrent palpitations are quite distinct from her symptoms then. There are brief her and slower as well as some irregularity suggesting the possibility of atrial fibrillation. We'll undertake monitoring using AliveCor monitor.  Given her chest pain syndrome which is quite  typical we'll undertake a scan Myoview imaging.  Sleep-disordered breathing may be contributing to her blood pressure. We will undertake a sleep study. In addition, she has noted a heart rate; she tells me that outpatient blood work including thyroid is normal. We'll begin her on carvedilol 6.25 twice daily.     Virl Axe

## 2013-09-28 NOTE — Patient Instructions (Signed)
Your physician has recommended you make the following change in your medication:  1) START Carvedilol 6.25 mg twice daily  Your physician has requested that you have a lexiscan myoview. For further information please visit [email or web address]. Please follow instruction sheet, as given.  Your physician has recommended that you have a sleep study. This test records several body functions during sleep, including: brain activity, eye movement, oxygen and carbon dioxide blood levels, heart rate and rhythm, breathing rate and rhythm, the flow of air through your mouth and nose, snoring, body muscle movements, and chest and belly movement.  Your physician recommends that you schedule a follow-up appointment in: 6 weeks with Dr. Caryl Comes.   AliveCor monitor is recommended for you to purchase.

## 2013-10-04 ENCOUNTER — Ambulatory Visit (HOSPITAL_COMMUNITY): Payer: BC Managed Care – PPO | Attending: Internal Medicine | Admitting: Radiology

## 2013-10-04 VITALS — BP 119/81 | Ht 66.0 in | Wt 242.0 lb

## 2013-10-04 DIAGNOSIS — I456 Pre-excitation syndrome: Secondary | ICD-10-CM | POA: Insufficient documentation

## 2013-10-04 DIAGNOSIS — R079 Chest pain, unspecified: Secondary | ICD-10-CM | POA: Diagnosis present

## 2013-10-04 DIAGNOSIS — R9439 Abnormal result of other cardiovascular function study: Secondary | ICD-10-CM | POA: Diagnosis not present

## 2013-10-04 DIAGNOSIS — Z8249 Family history of ischemic heart disease and other diseases of the circulatory system: Secondary | ICD-10-CM | POA: Diagnosis not present

## 2013-10-04 DIAGNOSIS — R0602 Shortness of breath: Secondary | ICD-10-CM

## 2013-10-04 DIAGNOSIS — R0789 Other chest pain: Secondary | ICD-10-CM

## 2013-10-04 MED ORDER — REGADENOSON 0.4 MG/5ML IV SOLN
0.4000 mg | Freq: Once | INTRAVENOUS | Status: AC
  Administered 2013-10-04: 0.4 mg via INTRAVENOUS

## 2013-10-04 MED ORDER — TECHNETIUM TC 99M SESTAMIBI GENERIC - CARDIOLITE
30.0000 | Freq: Once | INTRAVENOUS | Status: AC | PRN
  Administered 2013-10-04: 30 via INTRAVENOUS

## 2013-10-04 NOTE — Progress Notes (Signed)
[facility] [address] [place], [place] [address] [phone]    Cardiology Nuclear Med Study  Connie Haynes is a 58 y.o. female     MRN : [id number]     DOB: 1955-03-09  Procedure Date: 10/04/2013  Nuclear Med Background Indication for Stress Test:  Evaluation for Ischemia History:  H/O WPW Cardiac Risk Factors: Family History - CAD, Hypertension and Lipids  Symptoms:  Chest Pain, Palpitations and SOB   Nuclear Pre-Procedure Caffeine/Decaff Intake:  None NPO After: 5 pm   Lungs:  clear O2 Sat: 96% on room air. IV 0.9% NS with Angio Cath:  22g  IV Site: R Antecubital  IV Started by:  Perrin Maltese, EMT-P  Chest Size (in):  42 Cup Size: C  Height: 5\' 6"  (1.676 m)  Weight:  242 lb (109.77 kg)  BMI:  Body mass index is 39.08 kg/(m^2). Tech Comments:  No Rx this am    Nuclear Med Study 1 or 2 day study: 2 day  Stress Test Type:  Carlton Adam  Reading MD: n/a  Order Authorizing Provider:  S.Klein MD  Resting Radionuclide: Technetium 79m Sestamibi  Resting Radionuclide Dose: 33.0 mCi  On     10-05-13  Stress Radionuclide:  Technetium 66m Sestamibi  Stress Radionuclide Dose: 33.0 mCi   On      10-04-13          Stress Protocol Rest HR: 68 Stress HR: 95  Rest BP: 119/81 Stress BP: 143/84  Exercise Time (min): n/a METS: n/a   Predicted Max HR: 160 bpm % Max HR: 59.38 bpm Rate Pressure Product: 13585   Dose of Adenosine (mg):  n/a Dose of Lexiscan: 0.4 mg  Dose of Atropine (mg): n/a Dose of Dobutamine: n/a mcg/kg/min (at max HR)  Stress Test Technologist: Perrin Maltese, EMT-P  Nuclear Technologist:  Vedia Pereyra, CNMT     Rest Procedure:  Myocardial perfusion imaging was performed at rest 45 minutes following the intravenous administration of Technetium 64m Sestamibi. Rest ECG: NSR - Normal EKG  Stress Procedure:  The patient received IV Lexiscan 0.4 mg over 15-seconds.  Technetium 48m Sestamibi injected at 30-seconds. This patient had  sob, felt funny, and had nausea with the Lexiscan injection. Quantitative spect images were obtained after a 45 minute delay. Stress ECG: No significant change from baseline ECG  QPS Raw Data Images:  There is interference from nuclear activity from structures below the diaphragm. This does not affect the ability to read the study. Stress Images:  There is a medium sized , mild defect in the inferior wall due to the increased uptake in adjacent structures.  The uptake in the other regions is normal.    Rest Images:  There is a medium sized , mild defect in the inferior wall due to the increased uptake in adjacent structures.  The uptake in the other regions is normal. Subtraction (SDS):  No evidence of ischemia. Transient Ischemic Dilatation (Normal <1.22):  1.10 Lung/Heart Ratio (Normal <0.45):  0.24  Quantitative Gated Spect Images QGS EDV:  84 ml QGS ESV:  31 ml  Impression Exercise Capacity:  Lexiscan with no exercise. BP Response:  Normal blood pressure response. Clinical Symptoms:  No significant symptoms noted. ECG Impression:  No significant ST segment change suggestive of ischemia. Comparison with Prior Nuclear Study: No images to compare  Overall Impression:  Low risk stress nuclear study .  There is mild attenuation of the inferior wall that is most  likely due to uptake in the adjacent structures.  LV Ejection Fraction: 63%.  LV Wall Motion:  NL LV Function; NL Wall Motion   Thayer Headings, Brooke Bonito., MD, [facility] 10/05/2013, 4:33 PM [address],  [place] Pager [phone]

## 2013-10-05 ENCOUNTER — Encounter: Payer: Self-pay | Admitting: *Deleted

## 2013-10-05 ENCOUNTER — Ambulatory Visit (HOSPITAL_COMMUNITY): Payer: BC Managed Care – PPO | Attending: Cardiovascular Disease

## 2013-10-05 ENCOUNTER — Telehealth: Payer: Self-pay | Admitting: Internal Medicine

## 2013-10-05 DIAGNOSIS — R0989 Other specified symptoms and signs involving the circulatory and respiratory systems: Secondary | ICD-10-CM

## 2013-10-05 MED ORDER — TECHNETIUM TC 99M SESTAMIBI GENERIC - CARDIOLITE
33.0000 | Freq: Once | INTRAVENOUS | Status: AC | PRN
  Administered 2013-10-05: 33 via INTRAVENOUS

## 2013-10-05 NOTE — Telephone Encounter (Signed)
°  Connie Haynes is patients PT and needs to know if its ok for patient to have PT today? She is scheduled for her 2nd part of stress test. Patient is at PT now. Please call and advise.

## 2013-10-10 ENCOUNTER — Other Ambulatory Visit: Payer: Self-pay | Admitting: *Deleted

## 2013-10-10 MED ORDER — CARVEDILOL 6.25 MG PO TABS: 6.2500 mg | ORAL_TABLET | Freq: Two times a day (BID) | ORAL | Status: DC

## 2013-10-23 ENCOUNTER — Other Ambulatory Visit: Payer: Self-pay | Admitting: Family Medicine

## 2013-11-07 ENCOUNTER — Encounter (HOSPITAL_BASED_OUTPATIENT_CLINIC_OR_DEPARTMENT_OTHER): Payer: BC Managed Care – PPO

## 2013-11-09 ENCOUNTER — Ambulatory Visit (INDEPENDENT_AMBULATORY_CARE_PROVIDER_SITE_OTHER): Payer: BC Managed Care – PPO | Admitting: Internal Medicine

## 2013-11-09 ENCOUNTER — Encounter: Payer: Self-pay | Admitting: Internal Medicine

## 2013-11-09 VITALS — BP 133/86 | HR 62 | Ht 65.0 in | Wt 248.8 lb

## 2013-11-09 DIAGNOSIS — R002 Palpitations: Secondary | ICD-10-CM

## 2013-11-09 MED ORDER — CARVEDILOL 6.25 MG PO TABS: 6.2500 mg | ORAL_TABLET | Freq: Two times a day (BID) | ORAL | Status: DC

## 2013-11-09 MED ORDER — LOSARTAN POTASSIUM-HCTZ 100-12.5 MG PO TABS: 1.0000 | ORAL_TABLET | Freq: Every morning | ORAL | Status: DC

## 2013-11-09 NOTE — Progress Notes (Signed)
Patient Care Team: [name], MD as PCP - General (Family Medicine) Elsie Stain, MD as Attending Physician (Pulmonary Disease)   HPI  Connie Haynes is a 58 y.o. female Seen in followup for chest pain sleep disordered breathing and a history of ablation for WPW with recurrent palpitations.  Myoview scanning was normal 9/15; sleep study is pending. We will try to arrange it as home study  At our last visit we added carvedilol for her blood pressure. She swears is the next best thing to slice bread or chocolate chip cookies  She was using the AliveCor monitor for palpitations. She brings with her today; it was reviewed and showed only normal rhythm. Unfortunately palpitations of greatest note are really rather brief and by the time she gets the AliveCor monitor to have passed  Past Medical History  Diagnosis Date  . Wolff-Parkinson-White (WPW) syndrome   . Hypertension   . Hyperlipidemia   . IBS (irritable bowel syndrome)     controlled with med  . Kidney stones   . PONV (postoperative nausea and vomiting)     " woke up to soon"  . Cough   . Depression   . Disorder of tear duct system     frequent tearing of eyes  . Osteoarthritis     arthritis -joints-limited ROM shoulders more on right    Past Surgical History  Procedure Laterality Date  . Tear duct surgery    . Cervical spine surgery      cervical fusion with hardware retained  . Total abdominal hysterectomy    . Total hip arthroplasty      x 3(rt x2, lt x1)  . Ablation surgery      1993 [facility] (Dr. Hulen Skains)  . Eyelid carcinoma excision    . Joint replacement    . Cervical spine surgery    . Total shoulder arthroplasty Left 07/15/2012    Procedure: LEFT TOTAL SHOULDER ARTHROPLASTY;  Surgeon: Augustin Schooling, MD;  Location: [facility];  Service: Orthopedics;  Laterality: Left;  . Colon surgery      "colon detached from abdomen"  . Ablation coronary      '93 for WPW syndrome  . Total knee arthroplasty  Right 08/08/2013    Procedure: RIGHT TOTAL KNEE ARTHROPLASTY WITH SAPHENOUS NERVE SCAR EXCISION;  Surgeon: Mauri Pole, MD;  Location: [facility];  Service: Orthopedics;  Laterality: Right;    Current Outpatient Prescriptions  Medication Sig Dispense Refill  . aspirin 81 MG tablet Take 81 mg by mouth daily.      . carvedilol (COREG) 6.25 MG tablet Take 1 tablet (6.25 mg total) by mouth 2 (two) times daily.  30 tablet  2  . celecoxib (CELEBREX) 200 MG capsule Take 1 capsule (200 mg total) by mouth daily.  30 capsule  11  . Cholecalciferol (VITAMIN D) 1000 UNITS capsule Take 1,000 Units by mouth daily.       . DULoxetine (CYMBALTA) 60 MG capsule Take 1 capsule (60 mg total) by mouth every morning.  30 capsule  5  . losartan-hydrochlorothiazide (HYZAAR) 100-12.5 MG per tablet Take 1 tablet by mouth every morning.      . lubiprostone (AMITIZA) 24 MCG capsule Take 1 capsule (24 mcg total) by mouth 2 (two) times daily with a meal.  60 capsule  11  . Multiple Vitamin (MULTIVITAMIN) tablet Take 1 tablet by mouth daily.      . promethazine (PHENERGAN) 25 MG tablet  Take 1 tab po at bedtime and then q 6 hrs as needed for nausea vomiting  60 tablet  5  . rOPINIRole (REQUIP) 1 MG tablet Take 1 tablet (1 mg total) by mouth at bedtime.  30 tablet  11   No current facility-administered medications for this visit.    Allergies  Allergen Reactions  . Codeine Other (See Comments)    vomiting    Review of Systems negative except from HPI and PMH  Physical Exam BP 133/86  Pulse 62  Ht 5\' 5"  (1.651 m)  Wt 248 lb 12.8 oz (112.855 kg)  BMI 41.40 kg/m2 Well developed and well nourished in no acute distress HENT normal E scleral and icterus clear Neck Supple JVP flat; carotids brisk and full Clear to ausculation  Regular rate and rhythm, no murmurs gallops or rub Soft with active bowel sounds No clubbing cyanosis  Edema Alert and oriented, grossly normal motor and sensory function Skin Warm and  Dry  ECG demonstrates sinus rhythm at 62 Intervals 13/09/39 axis leftward at -6 RSR prime  Assessment and  Plan  Hypertension  Palpitations  Sleep apnea  We will see if we can arrange a home sleep study given the delay in hospital study.  She will continue to use the AliveCor monitor which demonstrated on these occasions only artifact is noted they're frequently very brief and thus have  past.  Blood pressure is much improved  Potassium levels July 15 were normal

## 2013-11-09 NOTE — Patient Instructions (Addendum)
Your physician recommends that you continue on your current medications as directed. Please refer to the Current Medication list given to you today.  Your physician has recommended that you have a home sleep study. This test records several body functions during sleep, including: brain activity, eye movement, oxygen and carbon dioxide blood levels, heart rate and rhythm, breathing rate and rhythm, the flow of air through your mouth and nose, snoring, body muscle movements, and chest and belly movement.  Your physician wants you to follow-up in: 6 months with Dr. Caryl Comes. You will receive a reminder letter in the mail two months in advance. If you don't receive a letter, please call our office to schedule the follow-up appointment.

## 2013-11-20 ENCOUNTER — Telehealth: Payer: Self-pay | Admitting: Internal Medicine

## 2013-11-20 MED ORDER — CARVEDILOL 6.25 MG PO TABS: 12.5000 mg | ORAL_TABLET | Freq: Two times a day (BID) | ORAL | Status: DC

## 2013-11-20 NOTE — Telephone Encounter (Signed)
Discussed with patient she wakes with headache and high BP readings as below. Her palpitations have improved greatly after starting new medications but BP has been uncontrolled.  HR has been 60s-80s. She is wondering what is the status of a home sleep study. Advised her will forward message to Dr. Caryl Comes and his nurse

## 2013-11-20 NOTE — Telephone Encounter (Signed)
New message      New medication is not working.  Bp is 194/112 in the mornings----it goes down a little to 160/94 until the medication wears off.  Pt also says she has dizziness, lightheadedness, etc.  Please advise

## 2013-11-20 NOTE — Telephone Encounter (Signed)
Spoke with Richardson Dopp, PA-C Increased coreg to 12.5 mg bid EKG in one week.

## 2013-11-28 ENCOUNTER — Ambulatory Visit (INDEPENDENT_AMBULATORY_CARE_PROVIDER_SITE_OTHER): Payer: BC Managed Care – PPO | Admitting: *Deleted

## 2013-11-28 VITALS — BP 144/76 | HR 67 | Ht 65.0 in | Wt 249.5 lb

## 2013-11-28 DIAGNOSIS — I10 Essential (primary) hypertension: Secondary | ICD-10-CM

## 2013-11-28 DIAGNOSIS — I456 Pre-excitation syndrome: Secondary | ICD-10-CM

## 2013-11-28 MED ORDER — CARVEDILOL 25 MG PO TABS: 25.0000 mg | ORAL_TABLET | Freq: Two times a day (BID) | ORAL | Status: DC

## 2013-11-28 NOTE — Progress Notes (Signed)
1.) Reason for visit: EKG - Scott Weaver,PA-C increased coreg from 6.25mg  two times a day to 12.5mg  two times a day 11/20/13.                  2.) Name of MD requesting visit: Nicki Reaper Weaver,PA-C/Dr Caryl Comes         3.)H&P: Copied from Dr Olin Pia 11/09/13 office note           Connie Haynes is a 58 y.o. female            Seen in followup for chest pain sleep disordered breathing and a history of ablation for WPW with recurrent palpitations.            Myoview scanning was normal 9/15; sleep study is pending. We will try to arrange it as home study            At our last visit we added carvedilol for her blood pressure. She swears is the next best thing to slice bread or chocolate chip cookies            She was using the [employer] monitor for palpitations. She brings with her today; it was reviewed and showed only normal rhythm.                     Unfortunately palpitations of greatest note are really rather brief and by the time she gets the [employer]ive[employer] monitor to have passed    4.) ROS related to problem:                      Pt states BP continues to be elevated in the afternoon--staying in the 160/94 range after taking medications.              Pt states her heart rate is in the 60-80 range when she checks her BP.  5.)Assessment and plan per MD:             EKG done and reviewed by Dr Radford Pax.            Per Dr Radford Pax :            Increase coreg to 25mg  two times a day.           Pt to take and record her heart rate and blood pressure two hours after taking her medication.           Pt to call with BP readings in 7-10 days.             Note above reviewed and agree with assessment and plan as stated above. Will increase Coreg to 25mg  BID for better BP control.  Signed: Fransico Him, MD [facility]

## 2013-11-28 NOTE — Patient Instructions (Signed)
Increase coreg to 25mg  two times a day.  Your physician has requested that you regularly monitor and record your blood pressure and heart rate readings at home. Please use the same machine at the same time of day to check your readings and record them. Call our office in 7-10 days to report the blood pressure and heart rate readings.

## 2013-12-07 ENCOUNTER — Telehealth: Payer: Self-pay | Admitting: Internal Medicine

## 2013-12-07 MED ORDER — AMLODIPINE BESYLATE 5 MG PO TABS: 5.0000 mg | ORAL_TABLET | Freq: Every day | ORAL | Status: DC

## 2013-12-07 MED ORDER — CARVEDILOL 25 MG PO TABS: ORAL_TABLET | ORAL | Status: DC

## 2013-12-07 NOTE — Telephone Encounter (Signed)
Follow up  ° ° ° °Returning call back to nurse  °

## 2013-12-07 NOTE — Telephone Encounter (Signed)
Called w/BP readings.  Was in office 10/20 for nurse visit for EKG.  BP was elevated. Dr. Radford Pax (DOD) increased Coreg to 25 mg BID.  Her BP readings over the last week are: 10/21: 8 AM 184/114 63; 12:00 164/100; 4:00 169/101 64; 7:30 PM 144/88 72  10/22: AM 162/98 74; 1:00 164/97 66; 4:00 169/97 97; 7 pm 160/103 78  other days BP range was 160-170/90's-114  10/27 197-110 after meds Also states Coreg makes her very tired and elevated BP makes her hyper Also wanted to know if her sleep study had been read. Spoke w/Dr. Caryl Comes who advises for her to reduce Coreg 12.5 mg BID; will add Amlopidine 5 mg daily. The copy of the sleep study results from [facility] are on Dr. Olin Pia station to review.  Advised pt that someone would call her once reviewed. Also advised per Dr. Caryl Comes to continue to monitor her BP and call her PCP for follow up of BP

## 2013-12-07 NOTE — Telephone Encounter (Signed)
New message          bp is still very high since medication change / pt would like to know if you have her sleep study results

## 2013-12-08 ENCOUNTER — Other Ambulatory Visit: Payer: Self-pay

## 2013-12-08 DIAGNOSIS — G4730 Sleep apnea, unspecified: Secondary | ICD-10-CM

## 2013-12-08 DIAGNOSIS — R0683 Snoring: Secondary | ICD-10-CM

## 2013-12-17 ENCOUNTER — Emergency Department (HOSPITAL_BASED_OUTPATIENT_CLINIC_OR_DEPARTMENT_OTHER): Payer: BC Managed Care – PPO

## 2013-12-17 ENCOUNTER — Emergency Department (HOSPITAL_BASED_OUTPATIENT_CLINIC_OR_DEPARTMENT_OTHER)
Admission: EM | Admit: 2013-12-17 | Discharge: 2013-12-17 | Disposition: A | Discharge status: Home / Self Care | Payer: BC Managed Care – PPO | Attending: Emergency Medicine | Admitting: Emergency Medicine

## 2013-12-17 ENCOUNTER — Encounter (HOSPITAL_BASED_OUTPATIENT_CLINIC_OR_DEPARTMENT_OTHER): Payer: Self-pay | Admitting: *Deleted

## 2013-12-17 DIAGNOSIS — Z79899 Other long term (current) drug therapy: Secondary | ICD-10-CM | POA: Insufficient documentation

## 2013-12-17 DIAGNOSIS — M1990 Unspecified osteoarthritis, unspecified site: Secondary | ICD-10-CM | POA: Insufficient documentation

## 2013-12-17 DIAGNOSIS — I10 Essential (primary) hypertension: Secondary | ICD-10-CM | POA: Diagnosis not present

## 2013-12-17 DIAGNOSIS — Z7982 Long term (current) use of aspirin: Secondary | ICD-10-CM | POA: Diagnosis not present

## 2013-12-17 DIAGNOSIS — Z791 Long term (current) use of non-steroidal anti-inflammatories (NSAID): Secondary | ICD-10-CM | POA: Insufficient documentation

## 2013-12-17 DIAGNOSIS — N200 Calculus of kidney: Secondary | ICD-10-CM | POA: Diagnosis not present

## 2013-12-17 DIAGNOSIS — R109 Unspecified abdominal pain: Secondary | ICD-10-CM | POA: Diagnosis present

## 2013-12-17 LAB — URINALYSIS, ROUTINE W REFLEX MICROSCOPIC
Bilirubin Urine: NEGATIVE
Glucose, UA: NEGATIVE mg/dL
Ketones, ur: NEGATIVE mg/dL
Nitrite: NEGATIVE
Protein, ur: NEGATIVE mg/dL
Specific Gravity, Urine: 1.027 (ref 1.005–1.030)
Urobilinogen, UA: 0.2 mg/dL (ref 0.0–1.0)
pH: 5 (ref 5.0–8.0)

## 2013-12-17 LAB — COMPREHENSIVE METABOLIC PANEL
ALT: 19 U/L (ref 0–35)
AST: 18 U/L (ref 0–37)
Albumin: 4.2 g/dL (ref 3.5–5.2)
Alkaline Phosphatase: 86 U/L (ref 39–117)
Anion gap: 15 (ref 5–15)
BUN: 21 mg/dL (ref 6–23)
CO2: 23 mEq/L (ref 19–32)
Calcium: 9.7 mg/dL (ref 8.4–10.5)
Chloride: 104 mEq/L (ref 96–112)
Creatinine, Ser: 1 mg/dL (ref 0.50–1.10)
GFR calc Af Amer: 70 mL/min — ABNORMAL LOW (ref >90)
GFR calc non Af Amer: 60 mL/min — ABNORMAL LOW (ref >90)
Glucose, Bld: 125 mg/dL — ABNORMAL HIGH (ref 70–99)
Potassium: 4.1 mEq/L (ref 3.7–5.3)
Sodium: 142 mEq/L (ref 137–147)
Total Bilirubin: 0.5 mg/dL (ref 0.3–1.2)
Total Protein: 7.2 g/dL (ref 6.0–8.3)

## 2013-12-17 LAB — CBC WITH DIFFERENTIAL/PLATELET
Basophils Absolute: 0.1 10*3/uL (ref 0.0–0.1)
Basophils Relative: 1 % (ref 0–1)
Eosinophils Absolute: 0.2 10*3/uL (ref 0.0–0.7)
Eosinophils Relative: 1 % (ref 0–5)
HCT: 37.7 % (ref 36.0–46.0)
Hemoglobin: 12.5 g/dL (ref 12.0–15.0)
Lymphocytes Relative: 8 % — ABNORMAL LOW (ref 12–46)
Lymphs Abs: 1.4 10*3/uL (ref 0.7–4.0)
MCH: 30.3 pg (ref 26.0–34.0)
MCHC: 33.2 g/dL (ref 30.0–36.0)
MCV: 91.3 fL (ref 78.0–100.0)
Monocytes Absolute: 0.8 10*3/uL (ref 0.1–1.0)
Monocytes Relative: 5 % (ref 3–12)
Neutro Abs: 14.8 10*3/uL — ABNORMAL HIGH (ref 1.7–7.7)
Neutrophils Relative %: 85 % — ABNORMAL HIGH (ref 43–77)
Platelets: 306 10*3/uL (ref 150–400)
RBC: 4.13 MIL/uL (ref 3.87–5.11)
RDW: 13.6 % (ref 11.5–15.5)
WBC: 17.4 10*3/uL — ABNORMAL HIGH (ref 4.0–10.5)

## 2013-12-17 LAB — URINE MICROSCOPIC-ADD ON

## 2013-12-17 LAB — LIPASE, BLOOD: Lipase: 15 U/L (ref 11–59)

## 2013-12-17 MED ORDER — ONDANSETRON HCL 4 MG/2ML IJ SOLN
4.0000 mg | Freq: Once | INTRAMUSCULAR | Status: AC
Start: 2013-12-17 — End: 2013-12-17
  Administered 2013-12-17: 4 mg via INTRAVENOUS
  Filled 2013-12-17: qty 2

## 2013-12-17 MED ORDER — OXYCODONE-ACETAMINOPHEN 5-325 MG PO TABS: 1.0000 | ORAL_TABLET | Freq: Four times a day (QID) | ORAL | Status: DC | PRN

## 2013-12-17 MED ORDER — FENTANYL CITRATE 0.05 MG/ML IJ SOLN
50.0000 ug | Freq: Once | INTRAMUSCULAR | Status: AC
  Administered 2013-12-17: 50 ug via INTRAVENOUS
  Filled 2013-12-17: qty 2

## 2013-12-17 MED ORDER — MORPHINE SULFATE 4 MG/ML IJ SOLN
4.0000 mg | Freq: Once | INTRAMUSCULAR | Status: AC
  Administered 2013-12-17: 4 mg via INTRAVENOUS
  Filled 2013-12-17: qty 1

## 2013-12-17 MED ORDER — ONDANSETRON 8 MG PO TBDP: ORAL_TABLET | ORAL | Status: DC

## 2013-12-17 MED ORDER — ONDANSETRON HCL 4 MG/2ML IJ SOLN
4.0000 mg | Freq: Once | INTRAMUSCULAR | Status: AC
  Administered 2013-12-17: 4 mg via INTRAVENOUS
  Filled 2013-12-17: qty 2

## 2013-12-17 MED ORDER — KETOROLAC TROMETHAMINE 30 MG/ML IJ SOLN
30.0000 mg | Freq: Once | INTRAMUSCULAR | Status: AC
  Administered 2013-12-17: 30 mg via INTRAVENOUS
  Filled 2013-12-17: qty 1

## 2013-12-17 NOTE — Discharge Instructions (Signed)
Percocet as prescribed as needed for pain.  If your symptoms have not resolved in the next 2-3 days, call [facility] to schedule a follow-up appointment. The contact information has been provided in this discharge summary.  Return to the emergency department if you develop worsening pain, high fever, or other new and concerning symptoms.   Kidney Stones Kidney stones (urolithiasis) are deposits that form inside your kidneys. The intense pain is caused by the stone moving through the urinary tract. When the stone moves, the ureter goes into spasm around the stone. The stone is usually passed in the urine.  CAUSES   A disorder that makes certain neck glands produce too much parathyroid hormone (primary hyperparathyroidism).  A buildup of uric acid crystals, similar to gout in your joints.  Narrowing (stricture) of the ureter.  A kidney obstruction present at birth (congenital obstruction).  Previous surgery on the kidney or ureters.  Numerous kidney infections. SYMPTOMS   Feeling sick to your stomach (nauseous).  Throwing up (vomiting).  Blood in the urine (hematuria).  Pain that usually spreads (radiates) to the groin.  Frequency or urgency of urination. DIAGNOSIS   Taking a history and physical exam.  Blood or urine tests.  CT scan.  Occasionally, an examination of the inside of the urinary bladder (cystoscopy) is performed. TREATMENT   Observation.  Increasing your fluid intake.  Extracorporeal shock wave lithotripsy--This is a noninvasive procedure that uses shock waves to break up kidney stones.  Surgery may be needed if you have severe pain or persistent obstruction. There are various surgical procedures. Most of the procedures are performed with the use of small instruments. Only small incisions are needed to accommodate these instruments, so recovery time is minimized. The size, location, and chemical composition are all important variables that will  determine the proper choice of action for you. Talk to your health care provider to better understand your situation so that you will minimize the risk of injury to yourself and your kidney.  HOME CARE INSTRUCTIONS   Drink enough water and fluids to keep your urine clear or pale yellow. This will help you to pass the stone or stone fragments.  Strain all urine through the provided strainer. Keep all particulate matter and stones for your health care provider to see. The stone causing the pain may be as small as a grain of salt. It is very important to use the strainer each and every time you pass your urine. The collection of your stone will allow your health care provider to analyze it and verify that a stone has actually passed. The stone analysis will often identify what you can do to reduce the incidence of recurrences.  Only take over-the-counter or prescription medicines for pain, discomfort, or fever as directed by your health care provider.  Make a follow-up appointment with your health care provider as directed.  Get follow-up X-rays if required. The absence of pain does not always mean that the stone has passed. It may have only stopped moving. If the urine remains completely obstructed, it can cause loss of kidney function or even complete destruction of the kidney. It is your responsibility to make sure X-rays and follow-ups are completed. Ultrasounds of the kidney can show blockages and the status of the kidney. Ultrasounds are not associated with any radiation and can be performed easily in a matter of minutes. SEEK MEDICAL CARE IF:  You experience pain that is progressive and unresponsive to any pain medicine you have been  prescribed. SEEK IMMEDIATE MEDICAL CARE IF:   Pain cannot be controlled with the prescribed medicine.  You have a fever or shaking chills.  The severity or intensity of pain increases over 18 hours and is not relieved by pain medicine.  You develop a new onset  of abdominal pain.  You feel faint or pass out.  You are unable to urinate. MAKE SURE YOU:   Understand these instructions.  Will watch your condition.  Will get help right away if you are not doing well or get worse. Document Released: 01/26/2005 Document Revised: 09/28/2012 Document Reviewed: 06/29/2012 [employer] Patient Information 2015 [place], [place]. This information is not intended to replace advice given to you by your health care provider. Make sure you discuss any questions you have with your health care provider.

## 2013-12-17 NOTE — ED Notes (Signed)
Left flank pain radiating to LL abdomen- pain started at 0430- +vomiting

## 2013-12-18 ENCOUNTER — Encounter: Payer: Self-pay | Admitting: Internal Medicine

## 2013-12-18 ENCOUNTER — Encounter (HOSPITAL_COMMUNITY)
Admission: EM | Disposition: A | Discharge status: Home Health | Payer: Self-pay | Source: Home / Self Care | Attending: Internal Medicine

## 2013-12-18 ENCOUNTER — Inpatient Hospital Stay (HOSPITAL_BASED_OUTPATIENT_CLINIC_OR_DEPARTMENT_OTHER)
Admission: EM | Admit: 2013-12-18 | Discharge: 2013-12-22 | DRG: 872 | Disposition: A | Discharge status: Home Health | Payer: BC Managed Care – PPO | Attending: Internal Medicine | Admitting: Internal Medicine

## 2013-12-18 ENCOUNTER — Inpatient Hospital Stay (HOSPITAL_COMMUNITY): Payer: BC Managed Care – PPO | Admitting: Anesthesiology

## 2013-12-18 ENCOUNTER — Encounter (HOSPITAL_BASED_OUTPATIENT_CLINIC_OR_DEPARTMENT_OTHER): Payer: Self-pay | Admitting: *Deleted

## 2013-12-18 ENCOUNTER — Other Ambulatory Visit: Payer: Self-pay | Admitting: Urology

## 2013-12-18 DIAGNOSIS — Z801 Family history of malignant neoplasm of trachea, bronchus and lung: Secondary | ICD-10-CM | POA: Diagnosis not present

## 2013-12-18 DIAGNOSIS — Z7982 Long term (current) use of aspirin: Secondary | ICD-10-CM | POA: Diagnosis not present

## 2013-12-18 DIAGNOSIS — Z23 Encounter for immunization: Secondary | ICD-10-CM | POA: Diagnosis not present

## 2013-12-18 DIAGNOSIS — N10 Acute tubulo-interstitial nephritis: Secondary | ICD-10-CM

## 2013-12-18 DIAGNOSIS — E785 Hyperlipidemia, unspecified: Secondary | ICD-10-CM | POA: Diagnosis present

## 2013-12-18 DIAGNOSIS — D72829 Elevated white blood cell count, unspecified: Secondary | ICD-10-CM | POA: Diagnosis present

## 2013-12-18 DIAGNOSIS — F329 Major depressive disorder, single episode, unspecified: Secondary | ICD-10-CM | POA: Diagnosis present

## 2013-12-18 DIAGNOSIS — N179 Acute kidney failure, unspecified: Secondary | ICD-10-CM | POA: Diagnosis present

## 2013-12-18 DIAGNOSIS — Z79899 Other long term (current) drug therapy: Secondary | ICD-10-CM | POA: Diagnosis not present

## 2013-12-18 DIAGNOSIS — R509 Fever, unspecified: Secondary | ICD-10-CM | POA: Diagnosis present

## 2013-12-18 DIAGNOSIS — N390 Urinary tract infection, site not specified: Secondary | ICD-10-CM | POA: Diagnosis present

## 2013-12-18 DIAGNOSIS — N136 Pyonephrosis: Secondary | ICD-10-CM | POA: Diagnosis present

## 2013-12-18 DIAGNOSIS — N201 Calculus of ureter: Secondary | ICD-10-CM

## 2013-12-18 DIAGNOSIS — K589 Irritable bowel syndrome without diarrhea: Secondary | ICD-10-CM | POA: Diagnosis present

## 2013-12-18 DIAGNOSIS — N202 Calculus of kidney with calculus of ureter: Secondary | ICD-10-CM | POA: Diagnosis present

## 2013-12-18 DIAGNOSIS — I10 Essential (primary) hypertension: Secondary | ICD-10-CM | POA: Diagnosis present

## 2013-12-18 DIAGNOSIS — Z885 Allergy status to narcotic agent status: Secondary | ICD-10-CM | POA: Diagnosis not present

## 2013-12-18 DIAGNOSIS — Z8249 Family history of ischemic heart disease and other diseases of the circulatory system: Secondary | ICD-10-CM

## 2013-12-18 DIAGNOSIS — Z87442 Personal history of urinary calculi: Secondary | ICD-10-CM | POA: Diagnosis not present

## 2013-12-18 DIAGNOSIS — Z79891 Long term (current) use of opiate analgesic: Secondary | ICD-10-CM | POA: Diagnosis not present

## 2013-12-18 DIAGNOSIS — Z96643 Presence of artificial hip joint, bilateral: Secondary | ICD-10-CM | POA: Diagnosis present

## 2013-12-18 DIAGNOSIS — A4151 Sepsis due to Escherichia coli [E. coli]: Secondary | ICD-10-CM | POA: Diagnosis present

## 2013-12-18 DIAGNOSIS — M1990 Unspecified osteoarthritis, unspecified site: Secondary | ICD-10-CM | POA: Diagnosis present

## 2013-12-18 DIAGNOSIS — Z806 Family history of leukemia: Secondary | ICD-10-CM | POA: Diagnosis not present

## 2013-12-18 DIAGNOSIS — I456 Pre-excitation syndrome: Secondary | ICD-10-CM | POA: Diagnosis present

## 2013-12-18 DIAGNOSIS — Z96612 Presence of left artificial shoulder joint: Secondary | ICD-10-CM | POA: Diagnosis present

## 2013-12-18 DIAGNOSIS — N12 Tubulo-interstitial nephritis, not specified as acute or chronic: Secondary | ICD-10-CM

## 2013-12-18 HISTORY — PX: CYSTOSCOPY/RETROGRADE/URETEROSCOPY: SHX5316

## 2013-12-18 LAB — CBC WITH DIFFERENTIAL/PLATELET
Basophils Absolute: 0.1 10*3/uL (ref 0.0–0.1)
Basophils Relative: 0 % (ref 0–1)
Eosinophils Absolute: 0.1 10*3/uL (ref 0.0–0.7)
Eosinophils Relative: 1 % (ref 0–5)
HCT: 32.7 % — ABNORMAL LOW (ref 36.0–46.0)
Hemoglobin: 10.9 g/dL — ABNORMAL LOW (ref 12.0–15.0)
Lymphocytes Relative: 4 % — ABNORMAL LOW (ref 12–46)
Lymphs Abs: 0.7 10*3/uL (ref 0.7–4.0)
MCH: 30.6 pg (ref 26.0–34.0)
MCHC: 33.3 g/dL (ref 30.0–36.0)
MCV: 91.9 fL (ref 78.0–100.0)
Monocytes Absolute: 0.8 10*3/uL (ref 0.1–1.0)
Monocytes Relative: 5 % (ref 3–12)
Neutro Abs: 15.7 10*3/uL — ABNORMAL HIGH (ref 1.7–7.7)
Neutrophils Relative %: 90 % — ABNORMAL HIGH (ref 43–77)
Platelets: 225 10*3/uL (ref 150–400)
RBC: 3.56 MIL/uL — ABNORMAL LOW (ref 3.87–5.11)
RDW: 13.6 % (ref 11.5–15.5)
WBC: 17.4 10*3/uL — ABNORMAL HIGH (ref 4.0–10.5)

## 2013-12-18 LAB — BASIC METABOLIC PANEL
Anion gap: 16 — ABNORMAL HIGH (ref 5–15)
BUN: 26 mg/dL — ABNORMAL HIGH (ref 6–23)
CO2: 23 mEq/L (ref 19–32)
Calcium: 8.7 mg/dL (ref 8.4–10.5)
Chloride: 97 mEq/L (ref 96–112)
Creatinine, Ser: 1.3 mg/dL — ABNORMAL HIGH (ref 0.50–1.10)
GFR calc Af Amer: 51 mL/min — ABNORMAL LOW (ref >90)
GFR calc non Af Amer: 44 mL/min — ABNORMAL LOW (ref >90)
Glucose, Bld: 152 mg/dL — ABNORMAL HIGH (ref 70–99)
Potassium: 3.8 mEq/L (ref 3.7–5.3)
Sodium: 136 mEq/L — ABNORMAL LOW (ref 137–147)

## 2013-12-18 LAB — URINALYSIS, ROUTINE W REFLEX MICROSCOPIC
Bilirubin Urine: NEGATIVE
Glucose, UA: NEGATIVE mg/dL
Ketones, ur: NEGATIVE mg/dL
Nitrite: NEGATIVE
Protein, ur: 100 mg/dL — AB
Specific Gravity, Urine: 1.021 (ref 1.005–1.030)
Urobilinogen, UA: 1 mg/dL (ref 0.0–1.0)
pH: 5 (ref 5.0–8.0)

## 2013-12-18 LAB — URINE MICROSCOPIC-ADD ON

## 2013-12-18 SURGERY — CYSTOSCOPY/RETROGRADE/URETEROSCOPY
Anesthesia: General | Site: Ureter | Laterality: Left

## 2013-12-18 MED ORDER — LIDOCAINE HCL (CARDIAC) 20 MG/ML IV SOLN
INTRAVENOUS | Status: AC
  Filled 2013-12-18: qty 5

## 2013-12-18 MED ORDER — SODIUM CHLORIDE 0.9 % IR SOLN
Status: DC | PRN
  Administered 2013-12-18: 3000 mL

## 2013-12-18 MED ORDER — HYDROMORPHONE HCL 1 MG/ML IJ SOLN
1.0000 mg | INTRAMUSCULAR | Status: AC | PRN
  Administered 2013-12-18: 1 mg via INTRAVENOUS
  Filled 2013-12-18: qty 1

## 2013-12-18 MED ORDER — IOHEXOL 300 MG/ML  SOLN
INTRAMUSCULAR | Status: DC | PRN
  Administered 2013-12-18: 4 mL

## 2013-12-18 MED ORDER — METOCLOPRAMIDE HCL 5 MG/ML IJ SOLN
INTRAMUSCULAR | Status: AC
  Filled 2013-12-18: qty 2

## 2013-12-18 MED ORDER — HYDROMORPHONE HCL 1 MG/ML IJ SOLN
1.0000 mg | Freq: Once | INTRAMUSCULAR | Status: AC
  Administered 2013-12-18: 1 mg via INTRAVENOUS
  Filled 2013-12-18: qty 1

## 2013-12-18 MED ORDER — METOCLOPRAMIDE HCL 5 MG/ML IJ SOLN
INTRAMUSCULAR | Status: DC | PRN
Start: 2013-12-18 — End: 2013-12-19
  Administered 2013-12-18: 10 mg via INTRAVENOUS

## 2013-12-18 MED ORDER — PIPERACILLIN-TAZOBACTAM 3.375 G IVPB
3.3750 g | Freq: Once | INTRAVENOUS | Status: AC
  Administered 2013-12-18: 3.375 g via INTRAVENOUS
  Filled 2013-12-18: qty 50

## 2013-12-18 MED ORDER — DEXAMETHASONE SODIUM PHOSPHATE 10 MG/ML IJ SOLN
INTRAMUSCULAR | Status: DC | PRN
  Administered 2013-12-18: 10 mg via INTRAVENOUS

## 2013-12-18 MED ORDER — ONDANSETRON HCL 4 MG/2ML IJ SOLN
INTRAMUSCULAR | Status: AC
  Filled 2013-12-18: qty 2

## 2013-12-18 MED ORDER — SUCCINYLCHOLINE CHLORIDE 20 MG/ML IJ SOLN
INTRAMUSCULAR | Status: DC | PRN
  Administered 2013-12-18: 100 mg via INTRAVENOUS

## 2013-12-18 MED ORDER — METOPROLOL TARTRATE 1 MG/ML IV SOLN
INTRAVENOUS | Status: DC | PRN
  Administered 2013-12-18 – 2013-12-19 (×2): 1 mg via INTRAVENOUS

## 2013-12-18 MED ORDER — METOPROLOL TARTRATE 1 MG/ML IV SOLN
INTRAVENOUS | Status: AC
  Filled 2013-12-18: qty 5

## 2013-12-18 MED ORDER — FENTANYL CITRATE 0.05 MG/ML IJ SOLN
INTRAMUSCULAR | Status: AC
  Filled 2013-12-18: qty 2

## 2013-12-18 MED ORDER — ONDANSETRON HCL 4 MG/2ML IJ SOLN
4.0000 mg | Freq: Three times a day (TID) | INTRAMUSCULAR | Status: AC | PRN
  Administered 2013-12-18: 4 mg via INTRAVENOUS
  Filled 2013-12-18: qty 2

## 2013-12-18 MED ORDER — DEXTROSE 5 % IV SOLN
1.0000 g | Freq: Every day | INTRAVENOUS | Status: DC
  Administered 2013-12-19 – 2013-12-21 (×3): 1 g via INTRAVENOUS
  Filled 2013-12-18 (×5): qty 10

## 2013-12-18 MED ORDER — ONDANSETRON HCL 4 MG/2ML IJ SOLN
4.0000 mg | Freq: Once | INTRAMUSCULAR | Status: AC
  Administered 2013-12-18: 4 mg via INTRAVENOUS
  Filled 2013-12-18: qty 2

## 2013-12-18 MED ORDER — LIDOCAINE HCL (PF) 2 % IJ SOLN
INTRAMUSCULAR | Status: DC | PRN
Start: 2013-12-18 — End: 2013-12-19
  Administered 2013-12-18: 75 mg via INTRADERMAL

## 2013-12-18 MED ORDER — PROPOFOL 10 MG/ML IV BOLUS
INTRAVENOUS | Status: DC | PRN
Start: 2013-12-18 — End: 2013-12-19
  Administered 2013-12-18: 200 mg via INTRAVENOUS

## 2013-12-18 MED ORDER — DEXAMETHASONE SODIUM PHOSPHATE 10 MG/ML IJ SOLN
INTRAMUSCULAR | Status: AC
Start: 2013-12-18 — End: 2013-12-18
  Filled 2013-12-18: qty 1

## 2013-12-18 MED ORDER — 0.9 % SODIUM CHLORIDE (POUR BTL) OPTIME
TOPICAL | Status: DC | PRN
  Administered 2013-12-18: 1000 mL

## 2013-12-18 MED ORDER — LACTATED RINGERS IV SOLN
INTRAVENOUS | Status: DC | PRN
  Administered 2013-12-18: 23:00:00 via INTRAVENOUS

## 2013-12-18 MED ORDER — HEPARIN SODIUM (PORCINE) 5000 UNIT/ML IJ SOLN
5000.0000 [IU] | Freq: Three times a day (TID) | INTRAMUSCULAR | Status: DC
  Administered 2013-12-19 – 2013-12-22 (×11): 5000 [IU] via SUBCUTANEOUS
  Filled 2013-12-18 (×12): qty 1

## 2013-12-18 MED ORDER — FENTANYL CITRATE 0.05 MG/ML IJ SOLN
INTRAMUSCULAR | Status: DC | PRN
  Administered 2013-12-18: 100 ug via INTRAVENOUS

## 2013-12-18 MED ORDER — ONDANSETRON HCL 4 MG/2ML IJ SOLN
INTRAMUSCULAR | Status: DC | PRN
  Administered 2013-12-18: 4 mg via INTRAVENOUS

## 2013-12-18 MED ORDER — PROPOFOL 10 MG/ML IV BOLUS
INTRAVENOUS | Status: AC
  Filled 2013-12-18: qty 20

## 2013-12-18 MED ORDER — PROMETHAZINE HCL 25 MG/ML IJ SOLN
25.0000 mg | Freq: Once | INTRAMUSCULAR | Status: AC
  Administered 2013-12-18: 25 mg via INTRAVENOUS
  Filled 2013-12-18: qty 1

## 2013-12-18 SURGICAL SUPPLY — 14 items
BAG URO CATCHER STRL LF (DRAPE) ×2 IMPLANT
BASKET LASER NITINOL 1.9FR (BASKET) ×2 IMPLANT
BASKET ZERO TIP NITINOL 2.4FR (BASKET) IMPLANT
CATH URET 5FR 28IN OPEN ENDED (CATHETERS) IMPLANT
DRAPE CAMERA CLOSED 9X96 (DRAPES) ×2 IMPLANT
GLOVE SURG SS PI 8.0 STRL IVOR (GLOVE) IMPLANT
GOWN STRL REUS W/TWL XL LVL3 (GOWN DISPOSABLE) ×2 IMPLANT
GUIDEWIRE ANG ZIPWIRE 038X150 (WIRE) IMPLANT
GUIDEWIRE STR DUAL SENSOR (WIRE) IMPLANT
MANIFOLD NEPTUNE II (INSTRUMENTS) ×2 IMPLANT
PACK CYSTO (CUSTOM PROCEDURE TRAY) ×2 IMPLANT
STENT CONTOUR 6FRX26X.038 (STENTS) ×2 IMPLANT
TUBING CONNECTING 10 (TUBING) ×2 IMPLANT
WIRE COONS/BENSON .038X145CM (WIRE) ×2 IMPLANT

## 2013-12-18 NOTE — ED Provider Notes (Signed)
CSN: [id number]     Arrival date & time 12/18/13  1718 History   First MD Initiated Contact with Patient 12/18/13 1743     Chief Complaint  Patient presents with  . Back Pain     (Consider location/radiation/quality/duration/timing/severity/associated sxs/prior Treatment) HPI Comments: Patient presents to the emergency department tonight after being diagnosed with a left ureteral stone yesterday. Patient's pain was controlled last night and she was discharged to home with oxycodone. She has also been taking Phenergan which she had at home. Her pain returned this morning is been severe. It is located in her left flank and suprapubic area of her abdomen. It has been associated with a new fever that has been as high as 102.58F. She has had several episodes of vomiting. No chest pain, cough, shortness of breath. She has taken aspirin with improvement in her fever. Pt passed three small stones she describes as gravel this morning.   Patient is a 58 y.o. female presenting with back pain. The history is provided by the patient.  Back Pain Associated symptoms: dysuria and fever   Associated symptoms: no abdominal pain, no chest pain and no headaches     Past Medical History  Diagnosis Date  . Wolff-Parkinson-White (WPW) syndrome   . Hypertension   . Hyperlipidemia   . IBS (irritable bowel syndrome)     controlled with med  . Kidney stones   . PONV (postoperative nausea and vomiting)     " woke up to soon"  . Cough   . Depression   . Disorder of tear duct system     frequent tearing of eyes  . Osteoarthritis     arthritis -joints-limited ROM shoulders more on right   Past Surgical History  Procedure Laterality Date  . Tear duct surgery    . Cervical spine surgery      cervical fusion with hardware retained  . Total abdominal hysterectomy    . Total hip arthroplasty      x 3(rt x2, lt x1)  . Ablation surgery      1993 [facility] (Dr. Hulen Skains)  . Eyelid carcinoma excision    . Joint  replacement    . Cervical spine surgery    . Total shoulder arthroplasty Left 07/15/2012    Procedure: LEFT TOTAL SHOULDER ARTHROPLASTY;  Surgeon: Augustin Schooling, MD;  Location: [facility];  Service: Orthopedics;  Laterality: Left;  . Colon surgery      "colon detached from abdomen"  . Ablation coronary      '93 for WPW syndrome  . Total knee arthroplasty Right 08/08/2013    Procedure: RIGHT TOTAL KNEE ARTHROPLASTY WITH SAPHENOUS NERVE SCAR EXCISION;  Surgeon: Mauri Pole, MD;  Location: [facility];  Service: Orthopedics;  Laterality: Right;   Family History  Problem Relation Age of Onset  . Leukemia Brother   . Heart disease Mother   . Hypertension Mother   . Hyperlipidemia Mother   . Heart disease Maternal Grandfather   . Cancer Paternal Grandfather   . Lung cancer Paternal Grandfather   . Cancer Maternal Uncle     bone   History  Substance Use Topics  . Smoking status: Never Smoker   . Smokeless tobacco: Never Used  . Alcohol Use: No   OB History    No data available     Review of Systems  Constitutional: Positive for fever.  HENT: Negative for rhinorrhea and sore throat.   Eyes: Negative for redness.  Respiratory: Negative for  cough.   Cardiovascular: Negative for chest pain.  Gastrointestinal: Negative for nausea, vomiting, abdominal pain and diarrhea.  Genitourinary: Positive for dysuria, hematuria and flank pain.  Musculoskeletal: Positive for back pain. Negative for myalgias.  Skin: Negative for rash.  Neurological: Negative for headaches.    Allergies  Codeine  Home Medications   Prior to Admission medications   Medication Sig Start Date End Date Taking? Authorizing Provider  amLODipine (NORVASC) 5 MG tablet Take 1 tablet (5 mg total) by mouth daily. 12/07/13   Deboraha Sprang, MD  aspirin 81 MG tablet Take 81 mg by mouth daily.    Historical Provider, MD  carvedilol (COREG) 25 MG tablet Take 12.5 mg (1/2 tablet) twice a day 12/07/13   Deboraha Sprang, MD   celecoxib (CELEBREX) 200 MG capsule Take 1 capsule (200 mg total) by mouth daily. 09/12/13 09/13/14  [name], MD  Cholecalciferol (VITAMIN D) 1000 UNITS capsule Take 1,000 Units by mouth daily.     Historical Provider, MD  DULoxetine (CYMBALTA) 60 MG capsule Take 1 capsule (60 mg total) by mouth every morning. 09/12/13 09/13/14  [name], MD  losartan-hydrochlorothiazide (HYZAAR) 100-12.5 MG per tablet Take 1 tablet by mouth every morning. 11/09/13   Deboraha Sprang, MD  lubiprostone (AMITIZA) 24 MCG capsule Take 1 capsule (24 mcg total) by mouth 2 (two) times daily with a meal. 09/12/13 09/13/14  [name], MD  Multiple Vitamin (MULTIVITAMIN) tablet Take 1 tablet by mouth daily.    Historical Provider, MD  ondansetron (ZOFRAN ODT) 8 MG disintegrating tablet 8mg  ODT q4 hours prn nausea 12/17/13   Veryl Speak, MD  oxyCODONE-acetaminophen (PERCOCET) 5-325 MG per tablet Take 1-2 tablets by mouth every 6 (six) hours as needed. 12/17/13   Veryl Speak, MD  promethazine (PHENERGAN) 25 MG tablet Take 1 tab po at bedtime and then q 6 hrs as needed for nausea vomiting 09/12/13 09/13/14  [name], MD  rOPINIRole (REQUIP) 1 MG tablet Take 1 tablet (1 mg total) by mouth at bedtime. 09/12/13 09/13/14  [name], MD   BP 146/86 mmHg  Pulse 98  Temp(Src) 100.5 F (38.1 C) (Oral)  Resp 22  SpO2 99%   Physical Exam  Constitutional: She appears well-developed and well-nourished.  HENT:  Head: Normocephalic and atraumatic.  Eyes: Conjunctivae are normal. Right eye exhibits no discharge. Left eye exhibits no discharge.  Neck: Normal range of motion. Neck supple.  Cardiovascular: Normal rate, regular rhythm and normal heart sounds.   Pulmonary/Chest: Effort normal and breath sounds normal.  Abdominal: Soft. There is no tenderness. There is CVA tenderness (severe, left-sided).  Musculoskeletal: Normal range of motion. She exhibits tenderness.  Neurological: She is alert.  Skin: Skin is warm and dry.   Psychiatric: She has a normal mood and affect.  Nursing note and vitals reviewed.   ED Course  Procedures (including critical care time) Labs Review Labs Reviewed  URINALYSIS, ROUTINE W REFLEX MICROSCOPIC - Abnormal; Notable for the following:    Color, Urine AMBER (*)    APPearance TURBID (*)    Hgb urine dipstick LARGE (*)    Protein, ur 100 (*)    Leukocytes, UA SMALL (*)    All other components within normal limits  URINE MICROSCOPIC-ADD ON - Abnormal; Notable for the following:    Bacteria, UA MANY (*)    All other components within normal limits  CBC WITH DIFFERENTIAL - Abnormal; Notable for the following:  WBC 17.4 (*)    RBC 3.56 (*)    Hemoglobin 10.9 (*)    HCT 32.7 (*)    Neutrophils Relative % 90 (*)    Neutro Abs 15.7 (*)    Lymphocytes Relative 4 (*)    All other components within normal limits  BASIC METABOLIC PANEL - Abnormal; Notable for the following:    Sodium 136 (*)    Glucose, Bld 152 (*)    BUN 26 (*)    Creatinine, Ser 1.30 (*)    GFR calc non Af Amer 44 (*)    GFR calc Af Amer 51 (*)    Anion gap 16 (*)    All other components within normal limits  URINE CULTURE    Imaging Review Ct Renal Stone Study  12/17/2013   CLINICAL DATA:  Severe left flank pain with microscopic hematuria for the past 8 hr. History of nephrolithiasis, hysterectomy and bilateral oophorectomy. Detached cecum repair with appendectomy.  EXAM: CT ABDOMEN AND PELVIS WITHOUT CONTRAST  TECHNIQUE: Multidetector CT imaging of the abdomen and pelvis was performed following the standard protocol without IV contrast.  COMPARISON:  Portable pelvis dated 08/02/2007.  No prior CTs.  FINDINGS: Mild dilatation of the the left renal collecting system and ureter to the level of the distal ureter. The ureter below that level is obscured by streak artifact produced by the bilateral hip prostheses. Left perinephric soft tissue stranding. Mild enlargement of the left kidney compared to the right  kidney. The right kidney and visualized portion of the right ureter have normal appearances. The urinary bladder is obscured by the streak artifacts. There are multiple small rounded calcific densities in the inferior left pelvis on the scout image. At least some of these were previously present.  Normal non contrasted appearance of the liver, spleen, pancreas, gallbladder and adrenal glands. Uterus and ovaries are surgically absent. The visualized bowel loops are unremarkable. No evidence of appendicitis. No enlarged lymph nodes. Mild linear scarring at both lung bases. Lumbar and lower thoracic spine degenerative changes.  IMPRESSION: 1. Mild left hydronephrosis and hydroureter to the level of the inferior pelvis. The distal ureter and urinary bladder are obscured by the streak artifact produced by the patient's bilateral hip prostheses. The hydronephrosis this most likely due to a nonvisualized distal left ureteral calculus. 2. Otherwise, unremarkable examination.   Electronically Signed   By: Enrique Sack M.D.   On: 12/17/2013 13:13     EKG Interpretation None       7:13 PM Patient seen and examined. Work-up initiated. Pt discussed with Dr. Leonides Schanz who has seen.    Vital signs reviewed and are as follows: BP 146/86 mmHg  Pulse 98  Temp(Src) 100.5 F (38.1 C) (Oral)  Resp 22  SpO2 99%  I spoke with Dr. Jeffie Pollock who would like patient transported to [facility] for stenting. Zosyn ordered.   I spoke with Dr. Alcario Drought who will be the admitting physician.   Pt updated. Additional pain medication ordered.   MDM   Final diagnoses:  Left ureteral stone  Pyelonephritis   Admit for infected L ureteral stone, intractable symptoms.     Carlisle Cater, PA-C 12/18/13 1922

## 2013-12-18 NOTE — H&P (Addendum)
Day of Surgery  Subjective: Connie Haynes is a 58 yo WM who I was asked to see in consultation for a history of stones who had the onset of left flank pain with nausea and  Vomiting and a low grade fever on Sunday.   She was seen in the ER and sent home with pain meds but returned today with increased pain and a fever to 102 with chills and tachycardia.   Her CT reveals a possible small left UVJ stone with obstruction. She has pyuria, hematuria and bacturia on her UA but the culture is pending.   She was given Zosyn at [facility] and was sent to [facility] for cystoscopy and stenting.    ROS:  Review of Systems  Constitutional: Positive for fever and chills.  Respiratory: Positive for shortness of breath.   Cardiovascular: Positive for chest pain.  Gastrointestinal: Positive for nausea, vomiting and constipation.  Genitourinary: Positive for dysuria, urgency and flank pain.  Musculoskeletal: Positive for back pain and joint pain.  Skin: Negative.   Neurological: Positive for headaches.  Endo/Heme/Allergies: Does not bruise/bleed easily.  Psychiatric/Behavioral: Negative for depression. The patient is not nervous/anxious.   All other systems reviewed and are negative.  Allergies  Allergen Reactions  . Codeine Other (See Comments)    vomiting    Past Medical History  Diagnosis Date  . Wolff-Parkinson-White (WPW) syndrome   . Hypertension   . Hyperlipidemia   . IBS (irritable bowel syndrome)     controlled with med  . Kidney stones   . PONV (postoperative nausea and vomiting)     " woke up to soon"  . Cough   . Depression   . Disorder of tear duct system     frequent tearing of eyes  . Osteoarthritis     arthritis -joints-limited ROM shoulders more on right    Past Surgical History  Procedure Laterality Date  . Tear duct surgery    . Cervical spine surgery      cervical fusion with hardware retained  . Total abdominal hysterectomy    . Total hip arthroplasty      x 3(rt x2, lt x1)   . Ablation surgery      19 93 [facility] (Dr. Hulen Skains)  . Eyelid carcinoma excision    . Joint replacement    . Cervical spine surgery    . Total shoulder arthroplasty Left 07/15/2012    Procedure: LEFT TOTAL SHOULDER ARTHROPLASTY;  Surgeon: Augustin Schooling, MD;  Location: [place];  Service: Orthopedics;  Laterality: Left;  . Colon surgery      "colon detached from abdomen"  . Cardiac electrophysiology study and ablation      '93 for WPW syndrome  . Total knee arthroplasty Right 08/08/2013    Procedure: RIGHT TOTAL KNEE ARTHROPLASTY WITH SAPHENOUS NERVE SCAR EXCISION;  Surgeon: Mauri Pole, MD;  Location: [facility];  Service: Orthopedics;  Laterality: Right;    History   Social History  . Marital Status: Married    Spouse Name: Toy Baker    Number of Children: N/A  . Years of Education: N/A   Occupational History  . [employer]   Social History Main Topics  . Smoking status: Never Smoker   . Smokeless tobacco: Never Used  . Alcohol Use: No  . Drug Use: No  . Sexual Activity:    Partners: Male   Other Topics Concern  . 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 Leukemia Brother   . Heart disease Mother   . Hypertension Mother   . Hyperlipidemia Mother   . Heart disease Maternal Grandfather   . Cancer Paternal Grandfather   . Lung cancer Paternal Grandfather   . Cancer Maternal Uncle     bone    Anti-infectives: Anti-infectives    Start     Dose/Rate Route Frequency Ordered Stop   12/18/13 2359  [MAR Hold]  cefTRIAXone (ROCEPHIN) 1 g in dextrose 5 % 50 mL IVPB     (MAR Hold since 12/18/13  2307)   1 g100 mL/hr over 30 Minutes Intravenous Daily at bedtime 12/18/13 2252     12/18/13 1915  piperacillin-tazobactam (ZOSYN) IVPB 3.375 g     3.375 g12.5 mL/hr over 240 Minutes Intravenous  Once 12/18/13 1902        Current Facility-Administered Medications  Medication Dose Route Frequency Provider Last Rate Last Dose  . [MAR Hold] cefTRIAXone (ROCEPHIN) 1 g in dextrose 5 % 50 mL IVPB  1 g Intravenous QHS Etta Quill, DO      . [MAR Hold] heparin injection 5,000 Units  5,000 Units Subcutaneous 3 times per day Etta Quill, DO      . [MAR Hold] HYDROmorphone (DILAUDID) injection 1 mg  1 mg Intravenous Q4H PRN Carlisle Cater, PA-C   1 mg at 12/18/13 2057  . [MAR Hold] ondansetron (ZOFRAN) injection 4 mg  4 mg Intravenous Q8H PRN Carlisle Cater, PA-C   4 mg at 12/18/13 1926  . piperacillin-tazobactam (ZOSYN) IVPB 3.375 g  3.375 g Intravenous Once Carlisle Cater, PA-C 12.5 mL/hr at 12/18/13 1927 3.375 g at 12/18/13 1927     Objective: Vital signs in last 24 hours: Temp:  [98.1 F (36.7 C)-100.5 F (38.1 C)] 98.1 F (36.7 C) (11/09 2255) Pulse Rate:  [89-106] 106 (11/09 2255) Resp:  [18-22] 18 (11/09 2153) BP: (109-146)/(62-86) 130/83 mmHg (11/09 2255) SpO2:  [90 %-99 %] 90 % (11/09 2255) Weight:  [112 kg (246 lb 14.6 oz)] 112 kg (246 lb 14.6 oz) (11/09 2255)  Intake/Output from previous day:   Intake/Output this shift: Total I/O In: -  Out: 400 [Urine:400]   Physical Exam  Constitutional: She is oriented to person, place, and time and well-developed, well-nourished, and in no distress.  HENT:  Head: Normocephalic and atraumatic.  Neck: Normal range of motion. Neck supple. No JVD present. No thyromegaly present.  Cardiovascular: Regular rhythm and normal heart sounds.   tachycardic  Pulmonary/Chest: Effort normal and breath sounds normal. No respiratory distress.  Abdominal: Soft. She exhibits no mass. There is tenderness (right upper and lower quadrant tender).  There is no rebound.  Musculoskeletal: Normal range of motion. She exhibits no edema or tenderness.  Neurological: She is alert and oriented to person, place, and time.  Skin: Skin is warm and dry.  Psychiatric: Mood and affect normal.    Lab Results:   Recent Labs  12/17/13 1212 12/18/13 1810  WBC 17.4* 17.4*  HGB 12.5 10.9*  HCT 37.7  32.7*  PLT 306 225   BMET  Recent Labs  12/17/13 1212 12/18/13 1810  NA 142 136*  K 4.1 3.8  CL 104 97  CO2 23 23  GLUCOSE 125* 152*  BUN 21 26*  CREATININE 1.00 1.30*  CALCIUM 9.7 8.7   PT/INR No results for input(s): LABPROT, INR in the last 72 hours. ABG No results for input(s): PHART, HCO3 in the last 72 hours.  Invalid input(s): PCO2, PO2  Studies/Results: Ct Renal Stone Study  12/17/2013   CLINICAL DATA:  Severe left flank pain with microscopic hematuria for the past 8 hr. History of nephrolithiasis, hysterectomy and bilateral oophorectomy. Detached cecum repair with appendectomy.  EXAM: CT ABDOMEN AND PELVIS WITHOUT CONTRAST  TECHNIQUE: Multidetector CT imaging of the abdomen and pelvis was performed following the standard protocol without IV contrast.  COMPARISON:  Portable pelvis dated 08/02/2007.  No prior CTs.  FINDINGS: Mild dilatation of the the left renal collecting system and ureter to the level of the distal ureter. The ureter below that level is obscured by streak artifact produced by the bilateral hip prostheses. Left perinephric soft tissue stranding. Mild enlargement of the left kidney compared to the right kidney. The right kidney and visualized portion of the right ureter have normal appearances. The urinary bladder is obscured by the streak artifacts. There are multiple small rounded calcific densities in the inferior left pelvis on the scout image. At least some of these were previously present.  Normal non contrasted appearance of the liver, spleen, pancreas, gallbladder and adrenal glands. Uterus and ovaries are  surgically absent. The visualized bowel loops are unremarkable. No evidence of appendicitis. No enlarged lymph nodes. Mild linear scarring at both lung bases. Lumbar and lower thoracic spine degenerative changes.  IMPRESSION: 1. Mild left hydronephrosis and hydroureter to the level of the inferior pelvis. The distal ureter and urinary bladder are obscured by the streak artifact produced by the patient's bilateral hip prostheses. The hydronephrosis this most likely due to a nonvisualized distal left ureteral calculus. 2. Otherwise, unremarkable examination.   Electronically Signed   By: Enrique Sack M.D.   On: 12/17/2013 13:13   I have discussed her case with the ER physician and reviewed her CT films and report as well as her labs and UA.   She has mild ARI and a leukocytosis.   Assessment: s/p Procedure(s): CYSTOSCOPY/RETROGRADE/LEFT STENT  Probably LUVJ stone with fever and sepsis.   Plan: I am going to do cystoscopy with left stent insertion with delayed ureteroscopy.   I have reviewed the risks of bleeding, infection, ureteral injury, need for secondary procedures, thrombotic events and anesthetic risks.   CC: Dr. Dion Saucier and Dr. Jennette Kettle.     LOS: 0 days    Tauna Macfarlane J 12/18/2013

## 2013-12-18 NOTE — Plan of Care (Signed)
58 yo F with infected and obstructing ureteral stone.  Being sent over for cystoscopy and stent.  Fevers and leukocytosis at this point.  Headed to [place] per report from [facility].

## 2013-12-18 NOTE — Anesthesia Preprocedure Evaluation (Addendum)
Anesthesia Evaluation  Patient identified by MRN, date of birth, ID band Patient awake    Reviewed: Allergy & Precautions, H&P , NPO status , Patient's Chart, lab work & pertinent test results  History of Anesthesia Complications (+) PONV and history of anesthetic complications  Airway Mallampati: II  TM Distance: >3 FB Neck ROM: Full    Dental no notable dental hx.    Pulmonary  Chronic cough breath sounds clear to auscultation  Pulmonary exam normal       Cardiovascular hypertension, Pt. on medications and Pt. on home beta blockers Rhythm:Regular Rate:Normal  Cardiology office visit 11-09-13 reviewed. (Dr. Caryl Comes)  H/O WPW s/p ablation.  ECG: Normal. CXR: June 2015: No active disease.   Neuro/Psych PSYCHIATRIC DISORDERS Anxiety Depression negative neurological ROS     GI/Hepatic negative GI ROS, Neg liver ROS,   Endo/Other  Morbid obesity  Renal/GU Renal disease  negative genitourinary   Musculoskeletal  (+) Arthritis -,   Abdominal (+) + obese,   Peds negative pediatric ROS (+)  Hematology negative hematology ROS (+)   Anesthesia Other Findings   Reproductive/Obstetrics negative OB ROS                            Anesthesia Physical Anesthesia Plan  ASA: III and emergent  Anesthesia Plan: General   Post-op Pain Management:    Induction: Intravenous  Airway Management Planned: LMA  Additional Equipment:   Intra-op Plan:   Post-operative Plan: Extubation in OR  Informed Consent: I have reviewed the patients History and Physical, chart, labs and discussed the procedure including the risks, benefits and alternatives for the proposed anesthesia with the patient or authorized representative who has indicated his/her understanding and acceptance.   Dental advisory given  Plan Discussed with: CRNA  Anesthesia Plan Comments:         Anesthesia Quick Evaluation

## 2013-12-18 NOTE — Brief Op Note (Signed)
12/18/2013  11:55 PM  PATIENT:  Connie Haynes  58 y.o. female  PRE-OPERATIVE DIAGNOSIS:  left ureteral stone  POST-OPERATIVE DIAGNOSIS:  left ureteral stone  PROCEDURE:  Procedure(s): CYSTOSCOPY/RETROGRADE/LEFT STENT (Left)  SURGEON:  Surgeon(s) and Role:    * Malka So, MD - Primary  PHYSICIAN ASSISTANT:   ASSISTANTS: none   ANESTHESIA:   general  EBL:  Total I/O In: -  Out: 400 [Urine:400]  BLOOD ADMINISTERED:none  DRAINS: 6 x 26 left JJ stent   LOCAL MEDICATIONS USED:  NONE  SPECIMEN:  No Specimen  DISPOSITION OF SPECIMEN:  N/A  COUNTS:  YES  TOURNIQUET:  * No tourniquets in log *  DICTATION: .Other Dictation: Dictation Number [id number]  PLAN OF CARE: Discharge to home after PACU  PATIENT DISPOSITION:  PACU - hemodynamically stable.   Delay start of Pharmacological VTE agent (>24hrs) due to surgical blood loss or risk of bleeding: not applicable

## 2013-12-18 NOTE — Assessment & Plan Note (Signed)
Ablated.

## 2013-12-18 NOTE — ED Notes (Signed)
Back pain since yesterday.

## 2013-12-18 NOTE — Plan of Care (Signed)
Problem: Phase I Progression Outcomes Goal: Pain controlled with appropriate interventions Outcome: Progressing     

## 2013-12-18 NOTE — ED Provider Notes (Signed)
CSN: [id number]     Arrival date & time 12/17/13  1126 History   First MD Initiated Contact with Patient 12/17/13 1228     Chief Complaint  Patient presents with  . Flank Pain     (Consider location/radiation/quality/duration/timing/severity/associated sxs/prior Treatment) HPI Comments: Patient is a 58 year old female who presents with severe left flank pain that woke her from sleep this morning.  She denies dysuria, fevers, injury.  She has had kidney stones in the past and feels as though this may be what is happening now.   Patient is a 58 y.o. female presenting with flank pain. The history is provided by the patient.  Flank Pain This is a new problem. The current episode started 3 to 5 hours ago. The problem occurs constantly. The problem has been rapidly worsening. Pertinent negatives include no abdominal pain. Nothing aggravates the symptoms. Nothing relieves the symptoms. She has tried nothing for the symptoms.    Past Medical History  Diagnosis Date  . Wolff-Parkinson-White (WPW) syndrome   . Hypertension   . Hyperlipidemia   . IBS (irritable bowel syndrome)     controlled with med  . Kidney stones   . PONV (postoperative nausea and vomiting)     " woke up to soon"  . Cough   . Depression   . Disorder of tear duct system     frequent tearing of eyes  . Osteoarthritis     arthritis -joints-limited ROM shoulders more on right   Past Surgical History  Procedure Laterality Date  . Tear duct surgery    . Cervical spine surgery      cervical fusion with hardware retained  . Total abdominal hysterectomy    . Total hip arthroplasty      x 3(rt x2, lt x1)  . Ablation surgery      1993 [facility] (Dr. Hulen Skains)  . Eyelid carcinoma excision    . Joint replacement    . Cervical spine surgery    . Total shoulder arthroplasty Left 07/15/2012    Procedure: LEFT TOTAL SHOULDER ARTHROPLASTY;  Surgeon: Augustin Schooling, MD;  Location: [place];  Service: Orthopedics;  Laterality: Left;   . Colon surgery      "colon detached from abdomen"  . Ablation coronary      '93 for WPW syndrome  . Total knee arthroplasty Right 08/08/2013    Procedure: RIGHT TOTAL KNEE ARTHROPLASTY WITH SAPHENOUS NERVE SCAR EXCISION;  Surgeon: Mauri Pole, MD;  Location: [facility];  Service: Orthopedics;  Laterality: Right;   Family History  Problem Relation Age of Onset  . Leukemia Brother   . Heart disease Mother   . Hypertension Mother   . Hyperlipidemia Mother   . Heart disease Maternal Grandfather   . Cancer Paternal Grandfather   . Lung cancer Paternal Grandfather   . Cancer Maternal Uncle     bone   History  Substance Use Topics  . Smoking status: Never Smoker   . Smokeless tobacco: Never Used  . Alcohol Use: No   OB History    No data available     Review of Systems  Gastrointestinal: Negative for abdominal pain.  Genitourinary: Positive for flank pain.  All other systems reviewed and are negative.     Allergies  Codeine  Home Medications   Prior to Admission medications   Medication Sig Start Date End Date Taking? Authorizing Provider  amLODipine (NORVASC) 5 MG tablet Take 1 tablet (5 mg total) by mouth  daily. 12/07/13   Deboraha Sprang, MD  aspirin 81 MG tablet Take 81 mg by mouth daily.    Historical Provider, MD  carvedilol (COREG) 25 MG tablet Take 12.5 mg (1/2 tablet) twice a day 12/07/13   Deboraha Sprang, MD  celecoxib (CELEBREX) 200 MG capsule Take 1 capsule (200 mg total) by mouth daily. 09/12/13 09/13/14  [name], MD  Cholecalciferol (VITAMIN D) 1000 UNITS capsule Take 1,000 Units by mouth daily.     Historical Provider, MD  DULoxetine (CYMBALTA) 60 MG capsule Take 1 capsule (60 mg total) by mouth every morning. 09/12/13 09/13/14  [name], MD  losartan-hydrochlorothiazide (HYZAAR) 100-12.5 MG per tablet Take 1 tablet by mouth every morning. 11/09/13   Deboraha Sprang, MD  lubiprostone (AMITIZA) 24 MCG capsule Take 1 capsule (24 mcg total) by mouth 2 (two)  times daily with a meal. 09/12/13 09/13/14  [name], MD  Multiple Vitamin (MULTIVITAMIN) tablet Take 1 tablet by mouth daily.    Historical Provider, MD  ondansetron (ZOFRAN ODT) 8 MG disintegrating tablet 8mg  ODT q4 hours prn nausea 12/17/13   Veryl Speak, MD  oxyCODONE-acetaminophen (PERCOCET) 5-325 MG per tablet Take 1-2 tablets by mouth every 6 (six) hours as needed. 12/17/13   Veryl Speak, MD  promethazine (PHENERGAN) 25 MG tablet Take 1 tab po at bedtime and then q 6 hrs as needed for nausea vomiting 09/12/13 09/13/14  [name], MD  rOPINIRole (REQUIP) 1 MG tablet Take 1 tablet (1 mg total) by mouth at bedtime. 09/12/13 09/13/14  [name], MD   BP 150/70 mmHg  Pulse 77  Temp(Src) 99.4 F (37.4 C) (Oral)  Resp 18  Ht 5\' 5"  (1.651 m)  Wt 212 lb (96.163 kg)  BMI 35.28 kg/m2  SpO2 98%  LMP  Physical Exam  Constitutional: She is oriented to person, place, and time. She appears well-developed and well-nourished. No distress.  HENT:  Head: Normocephalic and atraumatic.  Neck: Normal range of motion. Neck supple.  Cardiovascular: Normal rate and regular rhythm.  Exam reveals no gallop and no friction rub.   No murmur heard. Pulmonary/Chest: Effort normal and breath sounds normal. No respiratory distress. She has no wheezes.  Abdominal: Soft. Bowel sounds are normal. She exhibits no distension. There is tenderness. There is no rebound and no guarding.  There is ttp in the left lower quadrant and left flank.    Musculoskeletal: Normal range of motion.  Neurological: She is alert and oriented to person, place, and time.  Skin: Skin is warm and dry. She is not diaphoretic.  Nursing note and vitals reviewed.   ED Course  Procedures (including critical care time) Labs Review Labs Reviewed  URINALYSIS, ROUTINE W REFLEX MICROSCOPIC - Abnormal; Notable for the following:    APPearance TURBID (*)    Hgb urine dipstick LARGE (*)    Leukocytes, UA TRACE (*)    All other components  within normal limits  CBC WITH DIFFERENTIAL - Abnormal; Notable for the following:    WBC 17.4 (*)    Neutrophils Relative % 85 (*)    Neutro Abs 14.8 (*)    Lymphocytes Relative 8 (*)    All other components within normal limits  COMPREHENSIVE METABOLIC PANEL - Abnormal; Notable for the following:    Glucose, Bld 125 (*)    GFR calc non Af Amer 60 (*)    GFR calc Af Amer 70 (*)    All other components within  normal limits  URINE MICROSCOPIC-ADD ON - Abnormal; Notable for the following:    Bacteria, UA MANY (*)    All other components within normal limits  LIPASE, BLOOD    Imaging Review Ct Renal Stone Study  12/17/2013   CLINICAL DATA:  Severe left flank pain with microscopic hematuria for the past 8 hr. History of nephrolithiasis, hysterectomy and bilateral oophorectomy. Detached cecum repair with appendectomy.  EXAM: CT ABDOMEN AND PELVIS WITHOUT CONTRAST  TECHNIQUE: Multidetector CT imaging of the abdomen and pelvis was performed following the standard protocol without IV contrast.  COMPARISON:  Portable pelvis dated 08/02/2007.  No prior CTs.  FINDINGS: Mild dilatation of the the left renal collecting system and ureter to the level of the distal ureter. The ureter below that level is obscured by streak artifact produced by the bilateral hip prostheses. Left perinephric soft tissue stranding. Mild enlargement of the left kidney compared to the right kidney. The right kidney and visualized portion of the right ureter have normal appearances. The urinary bladder is obscured by the streak artifacts. There are multiple small rounded calcific densities in the inferior left pelvis on the scout image. At least some of these were previously present.  Normal non contrasted appearance of the liver, spleen, pancreas, gallbladder and adrenal glands. Uterus and ovaries are surgically absent. The visualized bowel loops are unremarkable. No evidence of appendicitis. No enlarged lymph nodes. Mild linear  scarring at both lung bases. Lumbar and lower thoracic spine degenerative changes.  IMPRESSION: 1. Mild left hydronephrosis and hydroureter to the level of the inferior pelvis. The distal ureter and urinary bladder are obscured by the streak artifact produced by the patient's bilateral hip prostheses. The hydronephrosis this most likely due to a nonvisualized distal left ureteral calculus. 2. Otherwise, unremarkable examination.   Electronically Signed   By: Enrique Sack M.D.   On: 12/17/2013 13:13     EKG Interpretation None      MDM   Final diagnoses:  Left flank pain  Renal calculus    CT reveals hydronephrosis on the left consistent with obstruction.  She has had a hip replacement in the past and the metal is obscuring the distal ureter an bladder.  I suspect a stone.  As the obstruction seems to be in the distal ureter, I feel she has a good chance of passing the stone on her own.  If she is not improving in the next 2-3 days, she is to follow up with urology.    Veryl Speak, MD 12/18/13 [phone]

## 2013-12-18 NOTE — ED Provider Notes (Signed)
Medical screening examination/treatment/procedure(s) were conducted as a shared visit with non-physician practitioner(s) and myself.  I personally evaluated the patient during the encounter.   EKG Interpretation None      Pt is a 58 y.o. F with history of hypertension, hyperlipidemia, prior kidney stones who presents to the emergency department with left flank pain, fevers, dysuria, decreased urinary output. Recently diagnosed with a left ureteral stone. She appears to have a infected stone today with a urine with small leukocytes and many bacteria, large hemoglobin. Leukocytosis of 17.4. She has mild acute renal failure, creatinine 1.3.  She does have a fever in the ED. We'll give IV antibiotics. Discussed with urology on-call who will see her at [facility] for intervention tonight. Dynamically stable, nontoxic.  [place], DO 12/18/13 1930

## 2013-12-18 NOTE — H&P (Signed)
[facility] History and Physical  Connie Haynes MCN:[id number] DOB: 11-01-1955 DOA: 12/18/2013  Referring physician: EDP PCP: Ival Bible, MD   Chief Complaint: Fever, chills   HPI: Connie Haynes is a 58 y.o. female h/o renal stones in past, diagnosed with probable L ureteral stone yesterday, returns to ED at [facility] today with new onset fever, chills, ongoing left flank pain, new dysuria, decreased UOP.  Nothing makes symptoms better or worse.  Now has UTI on top of kidney stone, transferred to [facility] at request of urology with plan to put in stent tonight (actually patient is already in pre-op area).  Review of Systems: Systems reviewed.  As above, otherwise negative  Past Medical History  Diagnosis Date  . Wolff-Parkinson-White (WPW) syndrome   . Hypertension   . Hyperlipidemia   . IBS (irritable bowel syndrome)     controlled with med  . Kidney stones   . PONV (postoperative nausea and vomiting)     " woke up to soon"  . Cough   . Depression   . Disorder of tear duct system     frequent tearing of eyes  . Osteoarthritis     arthritis -joints-limited ROM shoulders more on right   Past Surgical History  Procedure Laterality Date  . Tear duct surgery    . Cervical spine surgery      cervical fusion with hardware retained  . Total abdominal hysterectomy    . Total hip arthroplasty      x 3(rt x2, lt x1)  . Ablation surgery      1993 [facility] (Dr. Hulen Skains)  . Eyelid carcinoma excision    . Joint replacement    . Cervical spine surgery    . Total shoulder arthroplasty Left 07/15/2012    Procedure: LEFT TOTAL SHOULDER ARTHROPLASTY;  Surgeon: Augustin Schooling, MD;  Location: [facility];  Service: Orthopedics;  Laterality: Left;  . Colon surgery      "colon detached from abdomen"  . Cardiac electrophysiology study and ablation      '93 for WPW syndrome  . Total knee arthroplasty Right 08/08/2013    Procedure: RIGHT TOTAL KNEE ARTHROPLASTY WITH SAPHENOUS NERVE SCAR EXCISION;   Surgeon: Mauri Pole, MD;  Location: [facility];  Service: Orthopedics;  Laterality: Right;   Social History:  reports that she has never smoked. She has never used smokeless tobacco. She reports that she does not drink alcohol or use illicit drugs.  Allergies  Allergen Reactions  . Codeine Other (See Comments)    vomiting    Family History  Problem Relation Age of Onset  . Leukemia Brother   . Heart disease Mother   . Hypertension Mother   . Hyperlipidemia Mother   . Heart disease Maternal Grandfather   . Cancer Paternal Grandfather   . Lung cancer Paternal Grandfather   . Cancer Maternal Uncle     bone     Prior to Admission medications   Medication Sig Start Date End Date Taking? Authorizing Provider  amLODipine (NORVASC) 5 MG tablet Take 1 tablet (5 mg total) by mouth daily. 12/07/13   Deboraha Sprang, MD  aspirin 81 MG tablet Take 81 mg by mouth daily.    Historical Provider, MD  carvedilol (COREG) 25 MG tablet Take 12.5 mg (1/2 tablet) twice a day 12/07/13   Deboraha Sprang, MD  celecoxib (CELEBREX) 200 MG capsule Take 1 capsule (200 mg total) by mouth daily. 09/12/13 09/13/14  [name], MD  Cholecalciferol (VITAMIN D) 1000 UNITS capsule Take 1,000 Units by mouth daily.     Historical Provider, MD  DULoxetine (CYMBALTA) 60 MG capsule Take 1 capsule (60 mg total) by mouth every morning. 09/12/13 09/13/14  [name], MD  losartan-hydrochlorothiazide (HYZAAR) 100-12.5 MG per tablet Take 1 tablet by mouth every morning. 11/09/13   Deboraha Sprang, MD  lubiprostone (AMITIZA) 24 MCG capsule Take 1 capsule (24 mcg total) by mouth 2 (two) times daily with a meal. 09/12/13 09/13/14  [name], MD  Multiple Vitamin (MULTIVITAMIN) tablet Take 1 tablet by mouth daily.    Historical Provider, MD  ondansetron (ZOFRAN ODT) 8 MG disintegrating tablet 8mg  ODT q4 hours prn nausea 12/17/13   Veryl Speak, MD  oxyCODONE-acetaminophen (PERCOCET) 5-325 MG per tablet Take 1-2 tablets by mouth every 6  (six) hours as needed. 12/17/13   Veryl Speak, MD  promethazine (PHENERGAN) 25 MG tablet Take 1 tab po at bedtime and then q 6 hrs as needed for nausea vomiting 09/12/13 09/13/14  [name], MD  rOPINIRole (REQUIP) 1 MG tablet Take 1 tablet (1 mg total) by mouth at bedtime. 09/12/13 09/13/14  [name], MD   Physical Exam: Filed Vitals:   12/18/13 2255  BP: 130/83  Pulse: 106  Temp: 98.1 F (36.7 C)  Resp:     BP 130/83 mmHg  Pulse 106  Temp(Src) 98.1 F (36.7 C) (Oral)  Resp 18  Ht 5\' 5"  (1.651 m)  Wt 112 kg (246 lb 14.6 oz)  BMI 41.09 kg/m2  SpO2 90%  General Appearance:    Alert, oriented, no distress, appears stated age  Head:    Normocephalic, atraumatic  Eyes:    PERRL, EOMI, sclera non-icteric        Nose:   Nares without drainage or epistaxis. Mucosa, turbinates normal  Throat:   Moist mucous membranes. Oropharynx without erythema or exudate.  Neck:   Supple. No carotid bruits.  No thyromegaly.  No lymphadenopathy.   Back:     No CVA tenderness, no spinal tenderness  Lungs:     Clear to auscultation bilaterally, without wheezes, rhonchi or rales  Chest wall:    No tenderness to palpitation  Heart:    Regular rate and rhythm without murmurs, gallops, rubs  Abdomen:     Soft, non-tender, nondistended, normal bowel sounds, no organomegaly  Genitalia:    deferred  Rectal:    deferred  Extremities:   No clubbing, cyanosis or edema.  Pulses:   2+ and symmetric all extremities  Skin:   Skin color, texture, turgor normal, no rashes or lesions  Lymph nodes:   Cervical, supraclavicular, and axillary nodes normal  Neurologic:   CNII-XII intact. Normal strength, sensation and reflexes      throughout    Labs on Admission:  Basic Metabolic Panel:  Recent Labs Lab 12/17/13 1212 12/18/13 1810  NA 142 136*  K 4.1 3.8  CL 104 97  CO2 23 23  GLUCOSE 125* 152*  BUN 21 26*  CREATININE 1.00 1.30*  CALCIUM 9.7 8.7   Liver Function Tests:  Recent Labs Lab  12/17/13 1212  AST 18  ALT 19  ALKPHOS 86  BILITOT 0.5  PROT 7.2  ALBUMIN 4.2    Recent Labs Lab 12/17/13 1212  LIPASE 15   No results for input(s): AMMONIA in the last 168 hours. CBC:  Recent Labs Lab 12/17/13 1212 12/18/13 1810  WBC 17.4* 17.4*  NEUTROABS 14.8* 15.7*  HGB 12.5 10.9*  HCT 37.7 32.7*  MCV 91.3 91.9  PLT 306 225   Cardiac Enzymes: No results for input(s): CKTOTAL, CKMB, CKMBINDEX, TROPONINI in the last 168 hours.  BNP (last 3 results) No results for input(s): PROBNP in the last 8760 hours. CBG: No results for input(s): GLUCAP in the last 168 hours.  Radiological Exams on Admission: Ct Renal Stone Study  12/17/2013   CLINICAL DATA:  Severe left flank pain with microscopic hematuria for the past 8 hr. History of nephrolithiasis, hysterectomy and bilateral oophorectomy. Detached cecum repair with appendectomy.  EXAM: CT ABDOMEN AND PELVIS WITHOUT CONTRAST  TECHNIQUE: Multidetector CT imaging of the abdomen and pelvis was performed following the standard protocol without IV contrast.  COMPARISON:  Portable pelvis dated 08/02/2007.  No prior CTs.  FINDINGS: Mild dilatation of the the left renal collecting system and ureter to the level of the distal ureter. The ureter below that level is obscured by streak artifact produced by the bilateral hip prostheses. Left perinephric soft tissue stranding. Mild enlargement of the left kidney compared to the right kidney. The right kidney and visualized portion of the right ureter have normal appearances. The urinary bladder is obscured by the streak artifacts. There are multiple small rounded calcific densities in the inferior left pelvis on the scout image. At least some of these were previously present.  Normal non contrasted appearance of the liver, spleen, pancreas, gallbladder and adrenal glands. Uterus and ovaries are surgically absent. The visualized bowel loops are unremarkable. No evidence of appendicitis. No enlarged  lymph nodes. Mild linear scarring at both lung bases. Lumbar and lower thoracic spine degenerative changes.  IMPRESSION: 1. Mild left hydronephrosis and hydroureter to the level of the inferior pelvis. The distal ureter and urinary bladder are obscured by the streak artifact produced by the patient's bilateral hip prostheses. The hydronephrosis this most likely due to a nonvisualized distal left ureteral calculus. 2. Otherwise, unremarkable examination.   Electronically Signed   By: Enrique Sack M.D.   On: 12/17/2013 13:13    EKG: Independently reviewed.  Assessment/Plan Principal Problem:   Left ureteral stone Active Problems:   UTI (urinary tract infection)   1. Left ureteral stone complicated by presence of UTI now - 1. Patient already in pre-op area with plans to put in left ureteral stent momentarily, agree with proceeding 1. From cardiac risk standpoint has a low-risk stress test done just under 3 months ago 2. H/o WPW but this was ablated back in 1993. 2. ABX: got zosyn in ED x1, putting patient on rocephin IV q24h 3. Repeat CBC in AM to monitor leukocytosis. 4. Tylenol PRN fever 5. Dilaudid PRN pain, caution with this as dilaudid has already caused her to drop O2 sats in ED requiring O2 via [place], she is not normally on O2 at home.  Hopefully pain will be greatly improved post stent placement. 6. Zofran PRN nausea 7. Advance diet post op as tolerated.    Code Status: Full Code  Family Communication: Brother at bedside Disposition Plan: Admit to inpatient   Time spent: 70 min  Shyna Duignan M. [facility] Pager [phone]  If 7AM-7PM, please contact the day team taking care of the patient [email or web address] Password [email or web address] 12/18/2013, 11:09 PM

## 2013-12-19 ENCOUNTER — Encounter (HOSPITAL_COMMUNITY): Payer: Self-pay | Admitting: Urology

## 2013-12-19 DIAGNOSIS — N179 Acute kidney failure, unspecified: Secondary | ICD-10-CM

## 2013-12-19 DIAGNOSIS — N390 Urinary tract infection, site not specified: Secondary | ICD-10-CM

## 2013-12-19 DIAGNOSIS — N132 Hydronephrosis with renal and ureteral calculous obstruction: Secondary | ICD-10-CM

## 2013-12-19 DIAGNOSIS — A419 Sepsis, unspecified organism: Secondary | ICD-10-CM

## 2013-12-19 DIAGNOSIS — R319 Hematuria, unspecified: Secondary | ICD-10-CM

## 2013-12-19 LAB — BASIC METABOLIC PANEL
Anion gap: 15 (ref 5–15)
BUN: 22 mg/dL (ref 6–23)
CO2: 21 mEq/L (ref 19–32)
Calcium: 8.8 mg/dL (ref 8.4–10.5)
Chloride: 103 mEq/L (ref 96–112)
Creatinine, Ser: 1.26 mg/dL — ABNORMAL HIGH (ref 0.50–1.10)
GFR calc Af Amer: 53 mL/min — ABNORMAL LOW (ref >90)
GFR calc non Af Amer: 45 mL/min — ABNORMAL LOW (ref >90)
Glucose, Bld: 160 mg/dL — ABNORMAL HIGH (ref 70–99)
Potassium: 4.2 mEq/L (ref 3.7–5.3)
Sodium: 139 mEq/L (ref 137–147)

## 2013-12-19 LAB — CBC
HCT: 32 % — ABNORMAL LOW (ref 36.0–46.0)
Hemoglobin: 10.7 g/dL — ABNORMAL LOW (ref 12.0–15.0)
MCH: 30.1 pg (ref 26.0–34.0)
MCHC: 33.4 g/dL (ref 30.0–36.0)
MCV: 90.1 fL (ref 78.0–100.0)
Platelets: 199 10*3/uL (ref 150–400)
RBC: 3.55 MIL/uL — ABNORMAL LOW (ref 3.87–5.11)
RDW: 13.8 % (ref 11.5–15.5)
WBC: 17.3 10*3/uL — ABNORMAL HIGH (ref 4.0–10.5)

## 2013-12-19 MED ORDER — LACTATED RINGERS IV SOLN: INTRAVENOUS | Status: DC

## 2013-12-19 MED ORDER — OXYCODONE-ACETAMINOPHEN 5-325 MG PO TABS
2.0000 | ORAL_TABLET | ORAL | Status: DC | PRN
  Administered 2013-12-19 – 2013-12-22 (×9): 2 via ORAL
  Filled 2013-12-19 (×9): qty 2

## 2013-12-19 MED ORDER — FENTANYL CITRATE 0.05 MG/ML IJ SOLN
INTRAMUSCULAR | Status: AC
  Filled 2013-12-19: qty 2

## 2013-12-19 MED ORDER — ONDANSETRON 8 MG PO TBDP
8.0000 mg | ORAL_TABLET | Freq: Three times a day (TID) | ORAL | Status: DC | PRN
  Filled 2013-12-19: qty 1

## 2013-12-19 MED ORDER — CARVEDILOL 12.5 MG PO TABS
12.5000 mg | ORAL_TABLET | Freq: Two times a day (BID) | ORAL | Status: DC
  Administered 2013-12-19 – 2013-12-22 (×7): 12.5 mg via ORAL
  Filled 2013-12-19 (×7): qty 1

## 2013-12-19 MED ORDER — DULOXETINE HCL 60 MG PO CPEP
60.0000 mg | ORAL_CAPSULE | Freq: Every morning | ORAL | Status: DC
  Administered 2013-12-19 – 2013-12-22 (×4): 60 mg via ORAL
  Filled 2013-12-19 (×4): qty 1

## 2013-12-19 MED ORDER — LUBIPROSTONE 24 MCG PO CAPS
24.0000 ug | ORAL_CAPSULE | Freq: Two times a day (BID) | ORAL | Status: DC
  Administered 2013-12-19 – 2013-12-22 (×7): 24 ug via ORAL
  Filled 2013-12-19 (×8): qty 1

## 2013-12-19 MED ORDER — LOSARTAN POTASSIUM 50 MG PO TABS
100.0000 mg | ORAL_TABLET | Freq: Every day | ORAL | Status: DC
  Administered 2013-12-19: 100 mg via ORAL
  Filled 2013-12-19: qty 2

## 2013-12-19 MED ORDER — AMLODIPINE BESYLATE 5 MG PO TABS
5.0000 mg | ORAL_TABLET | Freq: Every day | ORAL | Status: DC
  Administered 2013-12-19 – 2013-12-22 (×4): 5 mg via ORAL
  Filled 2013-12-19 (×4): qty 1

## 2013-12-19 MED ORDER — PROMETHAZINE HCL 25 MG/ML IJ SOLN: 6.2500 mg | INTRAMUSCULAR | Status: DC | PRN

## 2013-12-19 MED ORDER — HYDROCHLOROTHIAZIDE 12.5 MG PO CAPS
12.5000 mg | ORAL_CAPSULE | Freq: Every day | ORAL | Status: DC
  Administered 2013-12-19: 12.5 mg via ORAL
  Filled 2013-12-19: qty 1

## 2013-12-19 MED ORDER — CELECOXIB 200 MG PO CAPS
200.0000 mg | ORAL_CAPSULE | Freq: Every day | ORAL | Status: DC
  Administered 2013-12-19 – 2013-12-22 (×4): 200 mg via ORAL
  Filled 2013-12-19 (×4): qty 1

## 2013-12-19 MED ORDER — ROPINIROLE HCL 1 MG PO TABS
1.0000 mg | ORAL_TABLET | Freq: Every day | ORAL | Status: DC
  Administered 2013-12-19 – 2013-12-21 (×3): 1 mg via ORAL
  Filled 2013-12-19 (×4): qty 1

## 2013-12-19 MED ORDER — PNEUMOCOCCAL VAC POLYVALENT 25 MCG/0.5ML IJ INJ
0.5000 mL | INJECTION | INTRAMUSCULAR | Status: AC
  Administered 2013-12-20: 0.5 mL via INTRAMUSCULAR
  Filled 2013-12-19 (×2): qty 0.5

## 2013-12-19 MED ORDER — FENTANYL CITRATE 0.05 MG/ML IJ SOLN
25.0000 ug | INTRAMUSCULAR | Status: DC | PRN
  Administered 2013-12-19: 25 ug via INTRAVENOUS

## 2013-12-19 MED ORDER — SODIUM CHLORIDE 0.9 % IV SOLN
INTRAVENOUS | Status: DC
  Administered 2013-12-19: 11:00:00 via INTRAVENOUS

## 2013-12-19 MED ORDER — ASPIRIN 81 MG PO CHEW
81.0000 mg | CHEWABLE_TABLET | Freq: Every day | ORAL | Status: DC
  Administered 2013-12-19 – 2013-12-22 (×4): 81 mg via ORAL
  Filled 2013-12-19 (×4): qty 1

## 2013-12-19 MED ORDER — LOSARTAN POTASSIUM-HCTZ 100-12.5 MG PO TABS: 1.0000 | ORAL_TABLET | Freq: Every morning | ORAL | Status: DC

## 2013-12-19 MED ORDER — PROMETHAZINE HCL 25 MG/ML IJ SOLN
25.0000 mg | Freq: Four times a day (QID) | INTRAMUSCULAR | Status: DC | PRN
  Administered 2013-12-19 – 2013-12-22 (×9): 25 mg via INTRAVENOUS
  Filled 2013-12-19 (×9): qty 1

## 2013-12-19 MED ORDER — SODIUM CHLORIDE 0.9 % IV SOLN
INTRAVENOUS | Status: DC
  Administered 2013-12-19 – 2013-12-21 (×5): via INTRAVENOUS
  Administered 2013-12-22: 75 mL/h via INTRAVENOUS

## 2013-12-19 NOTE — Progress Notes (Signed)
[facility] PROGRESS NOTE  Connie Haynes WSF:[id number] DOB: [birth year] DOA: 12/18/2013 PCP: Ival Bible, MD  Brief narrative 58 year old female with history of hypertension,irritable bowel syndrome, history of kidney stones, depression, WPW syndrome, hypertension who presented to the ED with a severe flank pain noted prior to admission without any fever, dysuria. A  CT scan in the ED showed left-sided hydronephrosis with possible small left UVJ stone. She was discharged home but returned back to the ED the same evening with dysuria and fever of 102F.with workup showing leukocytosis and UTI. Given renal stone with hydronephrosis and UTI and mild acute kidney injury she was transferred to [facility] for urology evaluation. Patient was taken to or for cystoscopy and left ureteral stenting done. No stone was found but had some inspissated pus which appear to be obstructing.    Assessment/plan Sepsis secondary to UTI with acute obstructing left hydronephrosis Patient underwent cystoscopy with left ureteral stent placement. No stone was identified as per urology. -UA positive for UTI. Received one dose Zosyn in the ED and currently on empiric IV Rocephin.Marland Kitchen Pending urine culture and plan for antibiotic course for 10-14 days -Supportive care with IV fluids, pain control and antiemetics. Still has leukocytosis but afebrile and abdominal pain improving.. Monitor repeat WBC in a.m.  Acute kidney injury Mild likely secondary to hydronephrosis. Monitor with fluids.will for her Hyzaar.  Hypertension Blood pressure stable. Holding Hyzaar for acute kidney injury. Continue carvedilol.  Continue remaining home medications.    Code Status:full code Family Communication:none at bedside Disposition Plan: home possibly tomorrow with antibiotics based on urine culture and sensitivity   Consultants:  Dr. Jeffie Pollock (urology)  Procedures:  Cystoscopy with stenting of left  ureter  Antibiotics:  IV Zosyn at 1  IV Rocephin since 11/10  HPI/Subjective: Patient seen and examined. Reports left flank and lower quadrant pain which is improving. Reports some nausea but no vomiting. Has been afebrile today .  Objective: Filed Vitals:   12/19/13 0428  BP: 108/66  Pulse: 75  Temp: 98 F (36.7 C)  Resp: 20    Intake/Output Summary (Last 24 hours) at 12/19/13 1058 Last data filed at 12/19/13 0449  Gross per 24 hour  Intake    600 ml  Output   1150 ml  Net   -550 ml   Filed Weights   12/18/13 2255  Weight: 112 kg (246 lb 14.6 oz)    Exam:   General:  Middle aged obese female in no acute distress  HEENT: No pallor, moist oral mucosa  Chest: Clear to auscultation bilaterally no normal S1 and S2, no murmurs  Abdomen: Soft, nondistended, left lower quadrant and left flank tenderness  Extremities: Warm, no edema   Data Reviewed: Basic Metabolic Panel:  Recent Labs Lab 12/17/13 1212 12/18/13 1810 12/19/13 0425  NA 142 136* 139  K 4.1 3.8 4.2  CL 104 97 103  CO2 23 23 21   GLUCOSE 125* 152* 160*  BUN 21 26* 22  CREATININE 1.00 1.30* 1.26*  CALCIUM 9.7 8.7 8.8   Liver Function Tests:  Recent Labs Lab 12/17/13 1212  AST 18  ALT 19  ALKPHOS 86  BILITOT 0.5  PROT 7.2  ALBUMIN 4.2    Recent Labs Lab 12/17/13 1212  LIPASE 15   No results for input(s): AMMONIA in the last 168 hours. CBC:  Recent Labs Lab 12/17/13 1212 12/18/13 1810 12/19/13 0425  WBC 17.4* 17.4* 17.3*  NEUTROABS 14.8* 15.7*  --   HGB 12.5  10.9* 10.7*  HCT 37.7 32.7* 32.0*  MCV 91.3 91.9 90.1  PLT 306 225 199   Cardiac Enzymes: No results for input(s): CKTOTAL, CKMB, CKMBINDEX, TROPONINI in the last 168 hours. BNP (last 3 results) No results for input(s): PROBNP in the last 8760 hours. CBG: No results for input(s): GLUCAP in the last 168 hours.  No results found for this or any previous visit (from the past 240 hour(s)).   Studies: Ct Renal  Stone Study  12/17/2013   CLINICAL DATA:  Severe left flank pain with microscopic hematuria for the past 8 hr. History of nephrolithiasis, hysterectomy and bilateral oophorectomy. Detached cecum repair with appendectomy.  EXAM: CT ABDOMEN AND PELVIS WITHOUT CONTRAST  TECHNIQUE: Multidetector CT imaging of the abdomen and pelvis was performed following the standard protocol without IV contrast.  COMPARISON:  Portable pelvis dated 08/02/2007.  No prior CTs.  FINDINGS: Mild dilatation of the the left renal collecting system and ureter to the level of the distal ureter. The ureter below that level is obscured by streak artifact produced by the bilateral hip prostheses. Left perinephric soft tissue stranding. Mild enlargement of the left kidney compared to the right kidney. The right kidney and visualized portion of the right ureter have normal appearances. The urinary bladder is obscured by the streak artifacts. There are multiple small rounded calcific densities in the inferior left pelvis on the scout image. At least some of these were previously present.  Normal non contrasted appearance of the liver, spleen, pancreas, gallbladder and adrenal glands. Uterus and ovaries are surgically absent. The visualized bowel loops are unremarkable. No evidence of appendicitis. No enlarged lymph nodes. Mild linear scarring at both lung bases. Lumbar and lower thoracic spine degenerative changes.  IMPRESSION: 1. Mild left hydronephrosis and hydroureter to the level of the inferior pelvis. The distal ureter and urinary bladder are obscured by the streak artifact produced by the patient's bilateral hip prostheses. The hydronephrosis this most likely due to a nonvisualized distal left ureteral calculus. 2. Otherwise, unremarkable examination.   Electronically Signed   By: Enrique Sack M.D.   On: 12/17/2013 13:13    Scheduled Meds: . amLODipine  5 mg Oral Daily  . aspirin  81 mg Oral Daily  . carvedilol  12.5 mg Oral BID WC  .  cefTRIAXone (ROCEPHIN)  IV  1 g Intravenous QHS  . celecoxib  200 mg Oral Daily  . DULoxetine  60 mg Oral q morning - 10a  . fentaNYL      . heparin  5,000 Units Subcutaneous 3 times per day  . lubiprostone  24 mcg Oral BID WC  . [START ON 12/20/2013] pneumococcal 23 valent vaccine  0.5 mL Intramuscular Tomorrow-1000  . rOPINIRole  1 mg Oral QHS   Continuous Infusions: . sodium chloride       Time spent: 25 minutes    Kensley Valladares  [facility] [phone] If 7PM-7AM, please contact night-coverage at [email or web address], password [facility] 12/19/2013, 10:58 AM  LOS: 1 day

## 2013-12-19 NOTE — Care Management Note (Addendum)
    Page 1 of 1   12/23/2013     2:18:53 PM CARE MANAGEMENT NOTE 12/23/2013  Patient:  Connie Haynes, Connie Haynes   Account Number:  [id number]  Date Initiated:  12/19/2013  Documentation initiated by:  [facility]  Subjective/Objective Assessment:   58 Y/O F ADMITTED W/L URETERAL STONE.     Action/Plan:   FROM HOME.   Anticipated DC Date:  12/22/2013   Anticipated DC Plan:  [place]  CM consult  Patient refused services      Choice offered to / List presented to:  C-1 Patient           Status of service:  Completed, signed off Medicare Important Message given?   (If response is "NO", the following Medicare IM given date fields will be blank) Date Medicare IM given:   Medicare IM given by:   Date Additional Medicare IM given:   Additional Medicare IM given by:    Discharge Disposition:  HOME/SELF CARE  Per UR Regulation:  Reviewed for med. necessity/level of care/duration of stay  If discussed at [place] of Stay Meetings, dates discussed:    Comments:  12/23/13 Connie Makela RN,BSN NCM [phone] UPON REVIEW OF CHART NOTED THAT Connie Haynes SPOKE TO PATIENT ABOUT Connie Haynes WOULD NEED TO PRIVATE PAY FOR HHC(SEE [place] DECLINED [place].  12/19/13 Connie Kozuch RN,[name] POD#1 CYSTOSCOPY.IV ABX.NO ANTICIPATED D/C NEEDS.

## 2013-12-19 NOTE — Progress Notes (Signed)
Patient ID: Connie Haynes, female   DOB: 1955/11/22, 58 y.o.   MRN: [id number] 1 Day Post-Op  Subjective: Connie Haynes had urosepsis with left ureteral obstruction and is feeling much better this morning with reduced pain.  At cystoscopy last night I didn't find an obvious stone but she had some inspissated pus that appeared obstructing.  Her temperature curve is declining and her Cr has improved.    ROS:  Review of Systems  Constitutional: Negative for chills.  Gastrointestinal: Negative for nausea.  Genitourinary: Positive for flank pain.    Anti-infectives: Anti-infectives    Start     Dose/Rate Route Frequency Ordered Stop   12/18/13 2359  cefTRIAXone (ROCEPHIN) 1 g in dextrose 5 % 50 mL IVPB     1 g100 mL/hr over 30 Minutes Intravenous Daily at bedtime 12/18/13 2252     12/18/13 1915  piperacillin-tazobactam (ZOSYN) IVPB 3.375 g     3.375 g12.5 mL/hr over 240 Minutes Intravenous  Once 12/18/13 1902 12/18/13 2327      Current Facility-Administered Medications  Medication Dose Route Frequency Provider Last Rate Last Dose  . amLODipine (NORVASC) tablet 5 mg  5 mg Oral Daily Malka So, MD      . aspirin chewable tablet 81 mg  81 mg Oral Daily Malka So, MD      . carvedilol (COREG) tablet 12.5 mg  12.5 mg Oral BID WC Malka So, MD      . cefTRIAXone (ROCEPHIN) 1 g in dextrose 5 % 50 mL IVPB  1 g Intravenous QHS Etta Quill, DO      . celecoxib (CELEBREX) capsule 200 mg  200 mg Oral Daily Malka So, MD      . DULoxetine (CYMBALTA) DR capsule 60 mg  60 mg Oral q morning - 10a Malka So, MD      . fentaNYL (SUBLIMAZE) 0.05 MG/ML injection           . heparin injection 5,000 Units  5,000 Units Subcutaneous 3 times per day Etta Quill, DO   5,000 Units at 12/19/13 4132  . losartan (COZAAR) tablet 100 mg  100 mg Oral Daily Etta Quill, DO       And  . hydrochlorothiazide (MICROZIDE) capsule 12.5 mg  12.5 mg Oral Daily Etta Quill, DO      . lubiprostone (AMITIZA)  capsule 24 mcg  24 mcg Oral BID WC Malka So, MD      . ondansetron (ZOFRAN-ODT) disintegrating tablet 8 mg  8 mg Oral Q8H PRN Malka So, MD      . Derrill Memo ON 12/20/2013] pneumococcal 23 valent vaccine (PNU-IMMUNE) injection 0.5 mL  0.5 mL Intramuscular Tomorrow-1000 Etta Quill, DO      . rOPINIRole (REQUIP) tablet 1 mg  1 mg Oral QHS Malka So, MD   1 mg at 12/19/13 0130     Objective: Vital signs in last 24 hours: Temp:  [98 F (36.7 C)-100.5 F (38.1 C)] 98 F (36.7 C) (11/10 0428) Pulse Rate:  [75-106] 75 (11/10 0428) Resp:  [2-25] 20 (11/10 0428) BP: (108-146)/(60-87) 108/66 mmHg (11/10 0428) SpO2:  [90 %-100 %] 92 % (11/10 0428) Weight:  [112 kg (246 lb 14.6 oz)] 112 kg (246 lb 14.6 oz) (11/09 2255)  Intake/Output from previous day: 11/09 0701 - 11/10 0700 In: 600 [I.V.:600] Out: 1150 [Urine:1150] Intake/Output this shift:     Physical Exam  Constitutional: She is well-developed, well-nourished, and  in no distress.  Cardiovascular: Normal rate and regular rhythm.   Pulmonary/Chest: Effort normal and breath sounds normal.  Abdominal: Soft. She exhibits no distension. There is tenderness (left flank). There is no guarding.  Vitals reviewed.   Lab Results:   Recent Labs  12/18/13 1810 12/19/13 0425  WBC 17.4* 17.3*  HGB 10.9* 10.7*  HCT 32.7* 32.0*  PLT 225 199   BMET  Recent Labs  12/18/13 1810 12/19/13 0425  NA 136* 139  K 3.8 4.2  CL 97 103  CO2 23 21  GLUCOSE 152* 160*  BUN 26* 22  CREATININE 1.30* 1.26*  CALCIUM 8.7 8.8   PT/INR No results for input(s): LABPROT, INR in the last 72 hours. ABG No results for input(s): PHART, HCO3 in the last 72 hours.  Invalid input(s): PCO2, PO2  Studies/Results: Ct Renal Stone Study  12/17/2013   CLINICAL DATA:  Severe left flank pain with microscopic hematuria for the past 8 hr. History of nephrolithiasis, hysterectomy and bilateral oophorectomy. Detached cecum repair with appendectomy.   EXAM: CT ABDOMEN AND PELVIS WITHOUT CONTRAST  TECHNIQUE: Multidetector CT imaging of the abdomen and pelvis was performed following the standard protocol without IV contrast.  COMPARISON:  Portable pelvis dated 08/02/2007.  No prior CTs.  FINDINGS: Mild dilatation of the the left renal collecting system and ureter to the level of the distal ureter. The ureter below that level is obscured by streak artifact produced by the bilateral hip prostheses. Left perinephric soft tissue stranding. Mild enlargement of the left kidney compared to the right kidney. The right kidney and visualized portion of the right ureter have normal appearances. The urinary bladder is obscured by the streak artifacts. There are multiple small rounded calcific densities in the inferior left pelvis on the scout image. At least some of these were previously present.  Normal non contrasted appearance of the liver, spleen, pancreas, gallbladder and adrenal glands. Uterus and ovaries are surgically absent. The visualized bowel loops are unremarkable. No evidence of appendicitis. No enlarged lymph nodes. Mild linear scarring at both lung bases. Lumbar and lower thoracic spine degenerative changes.  IMPRESSION: 1. Mild left hydronephrosis and hydroureter to the level of the inferior pelvis. The distal ureter and urinary bladder are obscured by the streak artifact produced by the patient's bilateral hip prostheses. The hydronephrosis this most likely due to a nonvisualized distal left ureteral calculus. 2. Otherwise, unremarkable examination.   Electronically Signed   By: Enrique Sack M.D.   On: 12/17/2013 13:13   Labs reviewed.   Assessment: s/p Procedure(s): CYSTOSCOPY/RETROGRADE/LEFT STENT  She is doing well post cystoscopy and stenting for left ureteral obstruction with urosepsis.  Plan: Continue antibiotics for a total of 10 days based on her culture which is pending.   Could empirically switch to oral cipro and consider discharge. I  will arrange office f/u next week for stent removal.   CC: Dr. Clementeen Graham.     LOS: 1 day    Vannie Hilgert J 12/19/2013

## 2013-12-19 NOTE — Transfer of Care (Signed)
Immediate Anesthesia Transfer of Care Note  Patient: Connie Haynes  Procedure(s) Performed: Procedure(s): CYSTOSCOPY/RETROGRADE/LEFT STENT (Left)  Patient Location: PACU  Anesthesia Type:General  Level of Consciousness: awake, sedated and patient cooperative  Airway & Oxygen Therapy: Patient Spontanous Breathing and Patient connected to face mask oxygen  Post-op Assessment: Report given to PACU RN and Post -op Vital signs reviewed and stable  Post vital signs: Reviewed and stable  Complications: No apparent anesthesia complications

## 2013-12-19 NOTE — Op Note (Signed)
Connie Haynes, [place]                  ACCOUNT NO.:  [id number]  MEDICAL RECORD NO.:  [id number]  LOCATION:  52                         FACILITY:  [facility]  PHYSICIAN:  Marshall Cork. Jeffie Pollock, M.D.    DATE OF BIRTH:  [birth year]  DATE OF PROCEDURE:  12/18/2013 DATE OF DISCHARGE:                              OPERATIVE REPORT   Patient of Jennette Kettle.  PROCEDURE:  Cystoscopy, left retrograde pyelogram with placement of left ureteral stent.  PREOPERATIVE DIAGNOSES:  Left distal ureteral stone with obstruction and sepsis.  POSTOPERATIVE DIAGNOSES:  Left distal ureteral stone with obstruction and sepsis.  SURGEON:  Marshall Cork. Jeffie Pollock, M.D.  ANESTHESIA:  General.  SPECIMEN:  None.  DRAINS:  6-French 26-cm left double-J stent.  COMPLICATIONS:  None.  INDICATIONS:  Ms. Ferch is a 58 year old white female with a history of stones, she had an onset on Sunday of left flank pain.  She had low- grade fever.  She was initially seen in the emergency room then, but sent home with pain medication.  She returns today with increased pain and a fever of 102.  A CT scan demonstrated hydronephrosis on the left with a possible left distal ureteral stone.  Although, she has bilateral hip arthroplasties which caused artifact rendering accurate assessment of the distal ureter difficult but it was felt that she needed a left ureteral stent to relieve the obstruction.  FINDINGS OF PROCEDURE:  She had been given Zosyn in the ER at approximately 7 o'clock.  She was taken to the operating room, where general anesthetic was induced.  She was placed in lithotomy position and was fitted with PAS hose.  Her perineum and genitalia were prepped with Betadine solution.  She was draped in usual sterile fashion.  Cystoscopy was performed, a 22-French scope and 12-degree lens. Examination revealed a normal urethra.  The bladder wall had mild trabeculation.  No tumors, stones, or inflammation were noted.  The right ureteral  orifice was unremarkable.  The left ureteral orifice had some slight erythema at the meatus, but no edema.  A 5-French open-end catheter was inserted and contrast was instilled in the left ureter very gently.  This demonstrated no real filling defect in the distal ureter and the ureter was fairly delicate to the kidney without __________ much hydronephrosis had been noted on the CT scan.  However, with the patient's persistent symptoms in the holding area, it was felt that the placement of the stent was indicated.  A guidewire was passed through the open-end catheter to the kidney. Once the guidewire was in place, there was some thick purulent material that effluxed with moderate pressure from the ureteral orifice consistent with a mild pyelonephrosis.  Once the wire was in position, a 6-French 26-cm double-J stent without string was inserted into the kidney under fluoroscopic guidance.  The tip of the stent went in the upper calyx and did not coil completely, but was well positioned.  Upon removal of the wire, good coil formed in the bladder.  At this point, the bladder was drained.  The patient was taken down from lithotomy position.  Her anesthetic was reversed.  She was moved to recovery room  in stable condition.  There were no complications.     Marshall Cork. Jeffie Pollock, M.D.     JJW/MEDQ  D:  12/19/2013  T:  12/19/2013  Job:  [id number]

## 2013-12-19 NOTE — Discharge Instructions (Signed)
Ureteral Stent Implantation Ureteral stent implantation is the implantation of a soft plastic tube with multiple holes into the tube that drains urine from your kidney to your bladder (ureter). The stent helps drain your kidney when there is a blockage of the flow of urine in your ureter. The stent has a coil on each end to keep it from falling out. One end stays in the kidney. The other end stays in the bladder. It is most often taken out after any blockage has been removed or your ureter has healed. Short-term stents have a string attached to make removal quite easy. Removal of a short-term stent can be done in your health care provider's office or by you at home. Long-term stents need to be changed every few months. LET [facility] CARE PROVIDER KNOW ABOUT:  Any allergies you have.  All medicines you are taking, including vitamins, herbs, eye drops, creams, and over-the-counter medicines.  Previous problems you or members of your family have had with the use of anesthetics.  Any blood disorders you have.  Previous surgeries you have had.  Medical conditions you have. RISKS AND COMPLICATIONS Generally, ureteral stent implantation is a safe procedure. However, as with any procedure, complications can occur. Possible complications include:  Movement of the stent away from where it was originally placed (migration). This may affect the ability of the stent to properly drain your kidney. If migration of the stent occurs, the stent may need to be replaced or repositioned.  Perforation of the ureter.  Infection. BEFORE THE PROCEDURE  You may be asked to wash your genital area with sterile soap the morning of your procedure.  You may be given an oral antibiotic which you should take with a sip of water as prescribed by your health care provider.  You may be asked to not eat or drink for 8 hours before the surgery. PROCEDURE  First you will be given an anesthetic so you do not feel pain  during the procedure.  Your health care provider will insert a special lighted instrument called a cystoscope into your bladder. This allows your health care provider to see the opening to your ureter.  A thin wire is carefully threaded into your bladder and up the ureter. The stent is inserted over the wire and the wire is then removed.  Your bladder will be emptied of urine. AFTER THE PROCEDURE You will be taken to a recovery room until it is okay for you to go home. Document Released: 01/24/2000 Document Revised: 01/31/2013 Document Reviewed: 07/05/2012 [facility] Patient Information 2015 [place], [place]. This information is not intended to replace advice given to you by your health care provider. Make sure you discuss any questions you have with your health care provider.

## 2013-12-19 NOTE — Anesthesia Postprocedure Evaluation (Signed)
  Anesthesia Post-op Note  Patient: Connie Haynes  Procedure(s) Performed: Procedure(s) (LRB): CYSTOSCOPY/RETROGRADE/LEFT STENT (Left)  Patient Location: PACU  Anesthesia Type: General  Level of Consciousness: awake and alert   Airway and Oxygen Therapy: Patient Spontanous Breathing  Post-op Pain: mild  Post-op Assessment: Post-op Vital signs reviewed, Patient's Cardiovascular Status Stable, Respiratory Function Stable, Patent Airway and No signs of Nausea or vomiting  Last Vitals:  Filed Vitals:   12/19/13 0428  BP: 108/66  Pulse: 75  Temp: 36.7 C  Resp: 20    Post-op Vital Signs: stable   Complications: No apparent anesthesia complications

## 2013-12-20 LAB — BASIC METABOLIC PANEL
Anion gap: 12 (ref 5–15)
BUN: 28 mg/dL — ABNORMAL HIGH (ref 6–23)
CO2: 22 mEq/L (ref 19–32)
Calcium: 8.9 mg/dL (ref 8.4–10.5)
Chloride: 104 mEq/L (ref 96–112)
Creatinine, Ser: 1.25 mg/dL — ABNORMAL HIGH (ref 0.50–1.10)
GFR calc Af Amer: 53 mL/min — ABNORMAL LOW (ref >90)
GFR calc non Af Amer: 46 mL/min — ABNORMAL LOW (ref >90)
Glucose, Bld: 142 mg/dL — ABNORMAL HIGH (ref 70–99)
Potassium: 4.2 mEq/L (ref 3.7–5.3)
Sodium: 138 mEq/L (ref 137–147)

## 2013-12-20 LAB — CBC
HCT: 30.3 % — ABNORMAL LOW (ref 36.0–46.0)
Hemoglobin: 10 g/dL — ABNORMAL LOW (ref 12.0–15.0)
MCH: 30.1 pg (ref 26.0–34.0)
MCHC: 33 g/dL (ref 30.0–36.0)
MCV: 91.3 fL (ref 78.0–100.0)
Platelets: 191 10*3/uL (ref 150–400)
RBC: 3.32 MIL/uL — ABNORMAL LOW (ref 3.87–5.11)
RDW: 14 % (ref 11.5–15.5)
WBC: 12.2 10*3/uL — ABNORMAL HIGH (ref 4.0–10.5)

## 2013-12-20 MED ORDER — MORPHINE SULFATE 2 MG/ML IJ SOLN: 1.0000 mg | INTRAMUSCULAR | Status: DC | PRN

## 2013-12-20 NOTE — Plan of Care (Signed)
Problem: Phase II Progression Outcomes Goal: Pain controlled Outcome: Completed/Met Date Met:  12/20/13 Goal: Progress activity as tolerated unless otherwise ordered Outcome: Completed/Met Date Met:  12/20/13 Goal: Progressing with IS, TCDB Outcome: Completed/Met Date Met:  12/20/13 Goal: Surgical site without signs of infection Outcome: Completed/Met Date Met:  12/20/13 Goal: Foley discontinued Outcome: Completed/Met Date Met:  12/20/13 Goal: Tolerating diet Outcome: Completed/Met Date Met:  12/20/13

## 2013-12-20 NOTE — Progress Notes (Signed)
[facility] PROGRESS NOTE  RAYHANA SLIDER TGG:[id number] DOB: October 24, 1955 DOA: 12/18/2013 PCP: Ival Bible, MD  Brief narrative 58 year old female with history of hypertension,irritable bowel syndrome, history of kidney stones, depression, WPW syndrome, hypertension who presented to the ED with a severe flank pain noted prior to admission without any fever, dysuria. A  CT scan in the ED showed left-sided hydronephrosis with possible small left UVJ stone. She was discharged home but returned back to the ED the same evening with dysuria and fever of 102F.with workup showing leukocytosis and UTI. Given renal stone with hydronephrosis and UTI and mild acute kidney injury she was transferred to [facility] for urology evaluation. Patient was taken to or for cystoscopy and left ureteral stenting done. No stone was found but had some inspissated pus which appear to be obstructing. Earlier today she reported that she passed two small stones.     Assessment/plan Sepsis secondary to UTI with acute obstructing left hydronephrosis Patient underwent cystoscopy with left ureteral stent placement. No stone was identified as per urology. -UA positive for UTI. Received one dose Zosyn in the ED and currently on empiric IV Rocephin.Marland Kitchen Pending urine culture and plan for antibiotic course for 10-14 days. Urine cultures grew enterococcus and sensitivities are pending.  -Supportive care with IV fluids, pain control and antiemetics. Still has leukocytosis but afebrile and abdominal pain improving.. Monitor repeat WBC in a.m.  Acute kidney injury Mild likely secondary to hydronephrosis. Monitor with fluids.will for her Hyzaar.  Hypertension Blood pressure stable. Holding Hyzaar for acute kidney injury. Continue carvedilol.   Leukocytosis: improving.   Continue remaining home medications.    Code Status:full code Family Communication:none at bedside Disposition Plan: home possibly tomorrow with  antibiotics based on urine culture and sensitivity   Consultants:  Dr. Jeffie Pollock (urology)  Procedures:  Cystoscopy with stenting of left ureter  Antibiotics:  IV Zosyn at 1  IV Rocephin since 11/10  HPI/Subjective: Patient seen and examined. Reports left flank and lower quadrant pain which is improving. Reports some nausea but no vomiting. Has been afebrile today .  Objective: Filed Vitals:   12/20/13 1549  BP: 117/61  Pulse: 68  Temp: 97.7 F (36.5 C)  Resp: 18    Intake/Output Summary (Last 24 hours) at 12/20/13 1603 Last data filed at 12/20/13 1341  Gross per 24 hour  Intake   2305 ml  Output   1100 ml  Net   1205 ml   Filed Weights   12/18/13 2255  Weight: 112 kg (246 lb 14.6 oz)    Exam:   General:  Middle aged obese female in no acute distress  HEENT: No pallor, moist oral mucosa  Chest: Clear to auscultation bilaterally no normal S1 and S2, no murmurs  Abdomen: Soft, nondistended, left lower quadrant and left flank tenderness  Extremities: Warm, no edema   Data Reviewed: Basic Metabolic Panel:  Recent Labs Lab 12/17/13 1212 12/18/13 1810 12/19/13 0425 12/20/13 0456  NA 142 136* 139 138  K 4.1 3.8 4.2 4.2  CL 104 97 103 104  CO2 23 23 21 22   GLUCOSE 125* 152* 160* 142*  BUN 21 26* 22 28*  CREATININE 1.00 1.30* 1.26* 1.25*  CALCIUM 9.7 8.7 8.8 8.9   Liver Function Tests:  Recent Labs Lab 12/17/13 1212  AST 18  ALT 19  ALKPHOS 86  BILITOT 0.5  PROT 7.2  ALBUMIN 4.2    Recent Labs Lab 12/17/13 1212  LIPASE 15   No results  for input(s): AMMONIA in the last 168 hours. CBC:  Recent Labs Lab 12/17/13 1212 12/18/13 1810 12/19/13 0425 12/20/13 0456  WBC 17.4* 17.4* 17.3* 12.2*  NEUTROABS 14.8* 15.7*  --   --   HGB 12.5 10.9* 10.7* 10.0*  HCT 37.7 32.7* 32.0* 30.3*  MCV 91.3 91.9 90.1 91.3  PLT 306 225 199 191   Cardiac Enzymes: No results for input(s): CKTOTAL, CKMB, CKMBINDEX, TROPONINI in the last 168 hours. BNP  (last 3 results) No results for input(s): PROBNP in the last 8760 hours. CBG: No results for input(s): GLUCAP in the last 168 hours.  Recent Results (from the past 240 hour(s))  Urine culture     Status: None (Preliminary result)   Collection Time: 12/18/13  5:32 PM  Result Value Ref Range Status   Specimen Description URINE, CLEAN CATCH  Final   Special Requests none Normal  Final   Culture  Setup Time   Final    12/19/2013 00:38 Performed at [facility]   Final    30,000 COLONIES/ML Performed at [place] Performed at [facility]    Report Status PENDING  Incomplete     Studies: No results found.  Scheduled Meds: . amLODipine  5 mg Oral Daily  . aspirin  81 mg Oral Daily  . carvedilol  12.5 mg Oral BID WC  . cefTRIAXone (ROCEPHIN)  IV  1 g Intravenous QHS  . celecoxib  200 mg Oral Daily  . DULoxetine  60 mg Oral q morning - 10a  . heparin  5,000 Units Subcutaneous 3 times per day  . lubiprostone  24 mcg Oral BID WC  . rOPINIRole  1 mg Oral QHS   Continuous Infusions: . sodium chloride 75 mL/hr at 12/20/13 1425     Time spent: 25 minutes    [facility] [phone] If 7PM-7AM, please contact night-coverage at [email or web address], password [facility] 12/20/2013, 4:03 PM  LOS: 2 days

## 2013-12-20 NOTE — Progress Notes (Signed)
Patient ID: Connie Haynes, female   DOB: Aug 29, 1955, 58 y.o.   MRN: [id number] 2 Days Post-Op  Subjective: Connie Haynes continues to improve.   Her pain is better and she has no more chills, fever or nausea.  Her culture is growing GNR's but sens are pending.  ROS:  Review of Systems  Constitutional: Negative for fever and chills.  Gastrointestinal: Negative for nausea.  Genitourinary: Positive for flank pain (but improved).  All other systems reviewed and are negative.   Anti-infectives: Anti-infectives    Start     Dose/Rate Route Frequency Ordered Stop   12/18/13 2359  cefTRIAXone (ROCEPHIN) 1 g in dextrose 5 % 50 mL IVPB     1 g100 mL/hr over 30 Minutes Intravenous Daily at bedtime 12/18/13 2252     12/18/13 1915  piperacillin-tazobactam (ZOSYN) IVPB 3.375 g     3.375 g12.5 mL/hr over 240 Minutes Intravenous  Once 12/18/13 1902 12/18/13 2327      Current Facility-Administered Medications  Medication Dose Route Frequency Provider Last Rate Last Dose  . 0.9 %  sodium chloride infusion   Intravenous Continuous Nishant Dhungel, MD 75 mL/hr at 12/20/13 0025    . amLODipine (NORVASC) tablet 5 mg  5 mg Oral Daily Malka So, MD   5 mg at 12/19/13 1005  . aspirin chewable tablet 81 mg  81 mg Oral Daily Malka So, MD   81 mg at 12/19/13 1005  . carvedilol (COREG) tablet 12.5 mg  12.5 mg Oral BID WC Malka So, MD   12.5 mg at 12/19/13 1713  . cefTRIAXone (ROCEPHIN) 1 g in dextrose 5 % 50 mL IVPB  1 g Intravenous QHS Etta Quill, DO   1 g at 12/19/13 2313  . celecoxib (CELEBREX) capsule 200 mg  200 mg Oral Daily Malka So, MD   200 mg at 12/19/13 1005  . DULoxetine (CYMBALTA) DR capsule 60 mg  60 mg Oral q morning - 10a Malka So, MD   60 mg at 12/19/13 1005  . heparin injection 5,000 Units  5,000 Units Subcutaneous 3 times per day Etta Quill, DO   5,000 Units at 12/20/13 0545  . lubiprostone (AMITIZA) capsule 24 mcg  24 mcg Oral BID WC Malka So, MD   24 mcg at 12/19/13  1713  . oxyCODONE-acetaminophen (PERCOCET/ROXICET) 5-325 MG per tablet 2 tablet  2 tablet Oral Q4H PRN Louellen Molder, MD   2 tablet at 12/19/13 1713  . pneumococcal 23 valent vaccine (PNU-IMMUNE) injection 0.5 mL  0.5 mL Intramuscular Tomorrow-1000 Etta Quill, DO      . promethazine (PHENERGAN) injection 25 mg  25 mg Intravenous Q6H PRN Nishant Dhungel, MD   25 mg at 12/19/13 1713  . rOPINIRole (REQUIP) tablet 1 mg  1 mg Oral QHS Malka So, MD   1 mg at 12/19/13 2313     Objective: Vital signs in last 24 hours: Temp:  [97.4 F (36.3 C)-98.1 F (36.7 C)] 98.1 F (36.7 C) (11/11 0429) Pulse Rate:  [66-89] 66 (11/11 0429) Resp:  [18-20] 18 (11/11 0429) BP: (107-115)/(59-64) 115/64 mmHg (11/11 0429) SpO2:  [93 %-95 %] 95 % (11/11 0429)  Intake/Output from previous day: 11/10 0701 - 11/11 0700 In: 813.8 [P.O.:480; I.V.:283.8; IV Piggyback:50] Out: 1100 [Urine:1100] Intake/Output this shift: Total I/O In: 290 [P.O.:240; IV Piggyback:50] Out: 300 [Urine:300]   Physical Exam  Constitutional: She is well-developed, well-nourished, and in no distress.  Pulmonary/Chest: Effort normal.  Abdominal: Soft. There is tenderness (reduced right flank tenderness).    Lab Results:   Recent Labs  12/19/13 0425 12/20/13 0456  WBC 17.3* 12.2*  HGB 10.7* 10.0*  HCT 32.0* 30.3*  PLT 199 191   BMET  Recent Labs  12/19/13 0425 12/20/13 0456  NA 139 138  K 4.2 4.2  CL 103 104  CO2 21 22  GLUCOSE 160* 142*  BUN 22 28*  CREATININE 1.26* 1.25*  CALCIUM 8.8 8.9   PT/INR No results for input(s): LABPROT, INR in the last 72 hours. ABG No results for input(s): PHART, HCO3 in the last 72 hours.  Invalid input(s): PCO2, PO2  Studies/Results: No results found.  Labs reviewed.   WBC falling.  Cr stable.  Cx noted above.  Notes from med service reviewed.  Assessment: s/p Procedure(s): CYSTOSCOPY/RETROGRADE/LEFT STENT  Left hydro with urosepsis now improving post  stent.  30K GNR on culture but sens pending.  Plan: Probable D/C home today. She will see me on Monday for stent removal.      LOS: 2 days    Malka So 12/20/2013

## 2013-12-20 NOTE — Evaluation (Signed)
Physical Therapy Evaluation Patient Details Name: Connie Haynes MRN: [id number] DOB: 1955/12/06 Today's Date: 12/20/2013   History of Present Illness  58 yo female s/p cystoscopy 12/19/13. 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   Clinical Impression  On eval, pt was Min guard assist for mobility-able to ambulate ~150 feet with rollator. Pt is noticeably weaker than normal-was independent at baseline. Recommend use of walker until strength, balance improve and HHPT. Feel pt could potentially benefit from another night's stay in hospital  if okay with MD.     Follow Up Recommendations Home health PT;Supervision - Intermittent    Equipment Recommendations  None recommended by PT    Recommendations for Other Services       Precautions / Restrictions Precautions Precautions: Fall Restrictions Weight Bearing Restrictions: No      Mobility  Bed Mobility Overal bed mobility: Modified Independent                Transfers Overall transfer level: Needs assistance   Transfers: Sit to/from Stand Sit to Stand: Supervision            Ambulation/Gait Ambulation/Gait assistance: Min guard Ambulation Distance (Feet): 150 Feet Assistive device: 4-wheeled walker Gait Pattern/deviations: Step-through pattern;Trunk flexed;Decreased stride length     General Gait Details: very slow gait speed. fatigues fairly easily.   Stairs            Wheelchair Mobility    Modified Rankin (Stroke Patients Only)       Balance                                             Pertinent Vitals/Pain Pain Assessment: No/denies pain    Home Living Family/patient expects to be discharged to:: Private residence Living Arrangements: Spouse/significant other Available Help at Discharge: Family Type of Home: House Home Access: Stairs to enter Entrance Stairs-Rails: None Entrance Stairs-Number of Steps: 2 Home Layout: Two level;Able to live on main level with bedroom/bathroom Home  Equipment: Gilford Rile - 2 wheels;Bedside commode      Prior Function Level of Independence: Independent               Hand Dominance        Extremity/Trunk Assessment   Upper Extremity Assessment: Generalized weakness           Lower Extremity Assessment: Generalized weakness      Cervical / Trunk Assessment: Normal  Communication   Communication: No difficulties  Cognition Arousal/Alertness: Awake/alert Behavior During Therapy: WFL for tasks assessed/performed Overall Cognitive Status: Within Functional Limits for tasks assessed                      General Comments      Exercises        Assessment/Plan    PT Assessment Patient needs continued PT services  PT Diagnosis Difficulty walking;Generalized weakness   PT Problem List Decreased strength;Decreased activity tolerance;Decreased balance;Decreased mobility  PT Treatment Interventions DME instruction;Gait training;Functional mobility training;Therapeutic activities;Therapeutic exercise;Patient/family education;Balance training   PT Goals (Current goals can be found in the Care Plan section) Acute Rehab PT Goals Patient Stated Goal: to feel better/stronger PT Goal Formulation: With patient Time For Goal Achievement: 01/03/14 Potential to Achieve Goals: Good    Frequency Min 3X/week   Barriers to discharge        Co-evaluation  End of Session   Activity Tolerance: Patient limited by fatigue Patient left: in bed;with call bell/phone within reach;with family/visitor present           Time: 1425-1445 PT Time Calculation (min) (ACUTE ONLY): 20 min   Charges:   PT Evaluation $Initial PT Evaluation Tier I: 1 Procedure PT Treatments $Gait Training: 8-22 mins   PT G Codes:          Weston Anna, MPT Pager: [phone]

## 2013-12-21 ENCOUNTER — Encounter (HOSPITAL_COMMUNITY): Payer: Self-pay | Admitting: Urology

## 2013-12-21 LAB — URINE CULTURE
Colony Count: 30000
Special Requests: NORMAL

## 2013-12-21 MED ORDER — ACETAMINOPHEN 325 MG PO TABS
650.0000 mg | ORAL_TABLET | Freq: Four times a day (QID) | ORAL | Status: DC | PRN
  Administered 2013-12-21: 650 mg via ORAL
  Filled 2013-12-21: qty 2

## 2013-12-21 MED ORDER — ACETAMINOPHEN 650 MG RE SUPP: 650.0000 mg | Freq: Four times a day (QID) | RECTAL | Status: DC | PRN

## 2013-12-21 NOTE — Progress Notes (Signed)
[facility] PROGRESS NOTE  Connie Haynes JQZ:[id number] DOB: [birth year] DOA: 12/18/2013 PCP: Ival Bible, MD  Brief narrative 58 year old female with history of hypertension,irritable bowel syndrome, history of kidney stones, depression, WPW syndrome, hypertension who presented to the ED with a severe flank pain noted prior to admission without any fever, dysuria. A  CT scan in the ED showed left-sided hydronephrosis with possible small left UVJ stone. She was discharged home but returned back to the ED the same evening with dysuria and fever of 102F.with workup showing leukocytosis and UTI. Given renal stone with hydronephrosis and UTI and mild acute kidney injury she was transferred to [facility] for urology evaluation. Patient was taken to or for cystoscopy and left ureteral stenting done. No stone was found but had some inspissated pus which appear to be obstructing. Earlier today she reported that she passed two small stones/ clots.     Assessment/plan Sepsis secondary to UTI with acute obstructing left hydronephrosis Patient underwent cystoscopy with left ureteral stent placement. No stone was identified as per urology. -UA positive for UTI. Received one dose Zosyn in the ED and currently on empiric IV Rocephin.Marland Kitchen Pending urine culture and plan for antibiotic course for 10-14 days. Urine cultures grew  ecoli sensitive to cipro and keflex.  -Supportive care with IV fluids, pain control and antiemetics. Still has leukocytosis but afebrile and abdominal pain improving.. Monitor repeat WBC in a.m.  Acute kidney injury Mild likely secondary to hydronephrosis. Monitor with fluids.will for her Hyzaar.  Hypertension Blood pressure stable. Holding Hyzaar for acute kidney injury. Continue carvedilol.   Leukocytosis: improving.   Continue remaining home medications.    Code Status:full code Family Communication:none at bedside Disposition Plan: home possibly tomorrow with  antibiotics   Consultants:  Dr. Jeffie Pollock (urology)  Procedures:  Cystoscopy with stenting of left ureter  Antibiotics:  IV Zosyn at 1  IV Rocephin since 11/10  HPI/Subjective: Patient seen and examined. Reports left flank and lower quadrant pain which is improving. Reports some nausea but no vomiting. Has been afebrile today but in severe pain requiring IV pain medications.   Objective: Filed Vitals:   12/21/13 1423  BP: 114/50  Pulse: 89  Temp: 99.7 F (37.6 C)  Resp: 18    Intake/Output Summary (Last 24 hours) at 12/21/13 1747 Last data filed at 12/21/13 1300  Gross per 24 hour  Intake    480 ml  Output    950 ml  Net   -470 ml   Filed Weights   12/18/13 2255  Weight: 112 kg (246 lb 14.6 oz)    Exam:   General:  Middle aged obese female in no acute distress  HEENT: No pallor, moist oral mucosa  Chest: Clear to auscultation bilaterally no normal S1 and S2, no murmurs  Abdomen: Soft, nondistended, left lower quadrant and left flank tenderness  Extremities: Warm, no edema   Data Reviewed: Basic Metabolic Panel:  Recent Labs Lab 12/17/13 1212 12/18/13 1810 12/19/13 0425 12/20/13 0456  NA 142 136* 139 138  K 4.1 3.8 4.2 4.2  CL 104 97 103 104  CO2 23 23 21 22   GLUCOSE 125* 152* 160* 142*  BUN 21 26* 22 28*  CREATININE 1.00 1.30* 1.26* 1.25*  CALCIUM 9.7 8.7 8.8 8.9   Liver Function Tests:  Recent Labs Lab 12/17/13 1212  AST 18  ALT 19  ALKPHOS 86  BILITOT 0.5  PROT 7.2  ALBUMIN 4.2    Recent Labs Lab 12/17/13 1212  LIPASE 15   No results for input(s): AMMONIA in the last 168 hours. CBC:  Recent Labs Lab 12/17/13 1212 12/18/13 1810 12/19/13 0425 12/20/13 0456  WBC 17.4* 17.4* 17.3* 12.2*  NEUTROABS 14.8* 15.7*  --   --   HGB 12.5 10.9* 10.7* 10.0*  HCT 37.7 32.7* 32.0* 30.3*  MCV 91.3 91.9 90.1 91.3  PLT 306 225 199 191   Cardiac Enzymes: No results for input(s): CKTOTAL, CKMB, CKMBINDEX, TROPONINI in the last 168  hours. BNP (last 3 results) No results for input(s): PROBNP in the last 8760 hours. CBG: No results for input(s): GLUCAP in the last 168 hours.  Recent Results (from the past 240 hour(s))  Urine culture     Status: None   Collection Time: 12/18/13  5:32 PM  Result Value Ref Range Status   Specimen Description URINE, CLEAN CATCH  Final   Special Requests none Normal  Final   Culture  Setup Time   Final    12/19/2013 00:38 Performed at [place]   Final    30,000 COLONIES/ML Performed at [place]   Final    ESCHERICHIA COLI Performed at [facility]    Report Status 12/21/2013 FINAL  Final   Organism ID, Bacteria ESCHERICHIA COLI  Final      Susceptibility   Escherichia coli - MIC*    AMPICILLIN >=32 RESISTANT Resistant     CEFAZOLIN <=4 SENSITIVE Sensitive     CEFTRIAXONE <=1 SENSITIVE Sensitive     CIPROFLOXACIN <=0.25 SENSITIVE Sensitive     GENTAMICIN <=1 SENSITIVE Sensitive     LEVOFLOXACIN <=0.12 SENSITIVE Sensitive     NITROFURANTOIN <=16 SENSITIVE Sensitive     TOBRAMYCIN <=1 SENSITIVE Sensitive     TRIMETH/SULFA >=320 RESISTANT Resistant     PIP/TAZO <=4 SENSITIVE Sensitive     * ESCHERICHIA COLI     Studies: No results found.  Scheduled Meds: . amLODipine  5 mg Oral Daily  . aspirin  81 mg Oral Daily  . carvedilol  12.5 mg Oral BID WC  . cefTRIAXone (ROCEPHIN)  IV  1 g Intravenous QHS  . celecoxib  200 mg Oral Daily  . DULoxetine  60 mg Oral q morning - 10a  . heparin  5,000 Units Subcutaneous 3 times per day  . lubiprostone  24 mcg Oral BID WC  . rOPINIRole  1 mg Oral QHS   Continuous Infusions: . sodium chloride 75 mL/hr at 12/21/13 0516     Time spent: 25 minutes    Norlene Lanes  [facility] [phone] If 7PM-7AM, please contact night-coverage at [email or web address], password [facility] 12/21/2013, 5:47 PM  LOS: 3 days

## 2013-12-21 NOTE — Plan of Care (Signed)
Problem: Discharge Progression Outcomes Goal: Tolerating diet Outcome: Completed/Met Date Met:  12/21/13     

## 2013-12-21 NOTE — Progress Notes (Signed)
Physical Therapy Treatment Patient Details Name: Connie Haynes MRN: [id number] DOB: [birth year] Today's Date: 12/21/2013    History of Present Illness 58 yo female s/p cystoscopy 12/19/13. 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     PT Comments    Pt continues to be limited by fatigue, and self reports of weakness and pain.  Pt's O2 sats dropped to 85% on RA after 50 feet of gait; all other vitals WNL.  Pt was positioned in chair and taken back to her room; nursing aware.    Follow Up Recommendations  Home health PT;Supervision - Intermittent     Equipment Recommendations  None recommended by PT    Recommendations for Other Services       Precautions / Restrictions Precautions Precautions: Fall Restrictions Weight Bearing Restrictions: No    Mobility  Bed Mobility Overal bed mobility: Modified Independent             General bed mobility comments: HOB elevated; increased time  Transfers Overall transfer level: Needs assistance Equipment used: Rolling walker (2 wheeled) Transfers: Sit to/from Stand Sit to Stand: Min guard         General transfer comment: pt requires min time to steady   Ambulation/Gait Ambulation/Gait assistance: Min assist;+2 safety/equipment Ambulation Distance (Feet): 50 Feet Assistive device: Rolling walker (2 wheeled) Gait Pattern/deviations: Step-through pattern;Decreased stride length;Decreased weight shift to right;Decreased weight shift to left Gait velocity: decreased   General Gait Details: significantly decreased gait speed; pt fatigues easily; O2 sats dropped to 85% on RA; pt states she is limited by pain and weakness   Stairs            Wheelchair Mobility    Modified Rankin (Stroke Patients Only)       Balance                                    Cognition Arousal/Alertness: Awake/alert Behavior During Therapy: WFL for tasks assessed/performed Overall Cognitive Status: Within Functional Limits for tasks assessed                      Exercises      General Comments        Pertinent Vitals/Pain Pain Assessment: 0-10 Pain Score: 4  Pain Location: abdomen Pain Intervention(s): Limited activity within patient's tolerance;Monitored during session;Repositioned    Home Living                      Prior Function            PT Goals (current goals can now be found in the care plan section) Progress towards PT goals: Progressing toward goals    Frequency  Min 3X/week    PT Plan Current plan remains appropriate    Co-evaluation             End of Session Equipment Utilized During Treatment: Gait belt Activity Tolerance: Patient limited by fatigue Patient left: in chair;with call bell/phone within reach;with nursing/sitter in room     Time: 1357-1425 PT Time Calculation (min) (ACUTE ONLY): 28 min  Charges:                       G Codes:      Connie Haynes,Connie Haynes, SPTA 12/21/2013, 3:15 PM   reviewed above  Connie Haynes  PTA [facility]  Acute  Rehab Pager  [phone]    

## 2013-12-21 NOTE — Progress Notes (Signed)
Agree with previous nurse assessment/documentation. No new changes noted.

## 2013-12-21 NOTE — Progress Notes (Signed)
Patient ID: Connie Haynes, female   DOB: [birth year], 58 y.o.   MRN: [id number] 3 Days Post-Op  Subjective: Connie Haynes had a chill and increased pain today but is doing better now.  She was too weak for discharge.   She has been passing small clots which might be the source of the pain.    ROS:  Review of Systems  Constitutional: Positive for chills.  Gastrointestinal: Positive for abdominal pain. Negative for nausea.    Anti-infectives: Anti-infectives    Start     Dose/Rate Route Frequency Ordered Stop   12/18/13 2359  cefTRIAXone (ROCEPHIN) 1 g in dextrose 5 % 50 mL IVPB     1 g100 mL/hr over 30 Minutes Intravenous Daily at bedtime 12/18/13 2252     12/18/13 1915  piperacillin-tazobactam (ZOSYN) IVPB 3.375 g     3.375 g12.5 mL/hr over 240 Minutes Intravenous  Once 12/18/13 1902 12/18/13 2327      Current Facility-Administered Medications  Medication Dose Route Frequency Provider Last Rate Last Dose  . 0.9 %  sodium chloride infusion   Intravenous Continuous Nishant Dhungel, MD 75 mL/hr at 12/21/13 0516    . amLODipine (NORVASC) tablet 5 mg  5 mg Oral Daily Malka So, MD   5 mg at 12/21/13 1004  . aspirin chewable tablet 81 mg  81 mg Oral Daily Malka So, MD   81 mg at 12/21/13 1004  . carvedilol (COREG) tablet 12.5 mg  12.5 mg Oral BID WC Malka So, MD   12.5 mg at 12/21/13 1717  . cefTRIAXone (ROCEPHIN) 1 g in dextrose 5 % 50 mL IVPB  1 g Intravenous QHS Etta Quill, DO   1 g at 12/20/13 2228  . celecoxib (CELEBREX) capsule 200 mg  200 mg Oral Daily Malka So, MD   200 mg at 12/21/13 1004  . DULoxetine (CYMBALTA) DR capsule 60 mg  60 mg Oral q morning - 10a Malka So, MD   60 mg at 12/21/13 1004  . heparin injection 5,000 Units  5,000 Units Subcutaneous 3 times per day Etta Quill, DO   5,000 Units at 12/21/13 1448  . lubiprostone (AMITIZA) capsule 24 mcg  24 mcg Oral BID WC Malka So, MD   24 mcg at 12/21/13 1717  . morphine 2 MG/ML injection 1-2 mg  1-2 mg  Intravenous Q4H PRN Hosie Poisson, MD      . oxyCODONE-acetaminophen (PERCOCET/ROXICET) 5-325 MG per tablet 2 tablet  2 tablet Oral Q4H PRN Nishant Dhungel, MD   2 tablet at 12/21/13 1104  . promethazine (PHENERGAN) injection 25 mg  25 mg Intravenous Q6H PRN Nishant Dhungel, MD   25 mg at 12/21/13 1104  . rOPINIRole (REQUIP) tablet 1 mg  1 mg Oral QHS Malka So, MD   1 mg at 12/20/13 2229     Objective: Vital signs in last 24 hours: Temp:  [98.2 F (36.8 C)-99.7 F (37.6 C)] 99.7 F (37.6 C) (11/12 1423) Pulse Rate:  [65-89] 89 (11/12 1423) Resp:  [18] 18 (11/12 1423) BP: (114-125)/(50-71) 114/50 mmHg (11/12 1423) SpO2:  [94 %-99 %] 98 % (11/12 1423)  Intake/Output from previous day: 11/11 0701 - 11/12 0700 In: 575 [I.V.:575] Out: 500 [Urine:500] Intake/Output this shift: Total I/O In: 480 [P.O.:480] Out: 650 [Urine:650]   Physical Exam  Constitutional: She is well-developed, well-nourished, and in no distress.  Vitals reviewed.   Lab Results:   Recent Labs  12/19/13 0425 12/20/13  0456  WBC 17.3* 12.2*  HGB 10.7* 10.0*  HCT 32.0* 30.3*  PLT 199 191   BMET  Recent Labs  12/19/13 0425 12/20/13 0456  NA 139 138  K 4.2 4.2  CL 103 104  CO2 21 22  GLUCOSE 160* 142*  BUN 22 28*  CREATININE 1.26* 1.25*  CALCIUM 8.8 8.9   PT/INR No results for input(s): LABPROT, INR in the last 72 hours. ABG No results for input(s): PHART, HCO3 in the last 72 hours.  Invalid input(s): PCO2, PO2  Studies/Results: No results found.   Assessment: s/p Procedure(s): CYSTOSCOPY/RETROGRADE/LEFT STENT  She had another chill and pain episode today and has been passing small clots which may have contributed to the pain.   Her culture grew e. Coli with resistance to sulfa and ampicillin but sensitive to the rest of the panel.  Plan: Hopefully she will be able to go home tomorrow.      LOS: 3 days    Jaylend Reiland J 12/21/2013

## 2013-12-21 NOTE — Plan of Care (Signed)
Problem: Phase II Progression Outcomes Goal: Vital signs stable Outcome: Completed/Met Date Met:  12/21/13

## 2013-12-22 MED ORDER — PROMETHAZINE HCL 25 MG PO TABS: ORAL_TABLET | ORAL | Status: DC

## 2013-12-22 MED ORDER — CIPROFLOXACIN HCL 500 MG PO TABS: 500.0000 mg | ORAL_TABLET | Freq: Two times a day (BID) | ORAL | Status: DC

## 2013-12-22 MED ORDER — OXYCODONE-ACETAMINOPHEN 5-325 MG PO TABS: 1.0000 | ORAL_TABLET | Freq: Four times a day (QID) | ORAL | Status: DC | PRN

## 2013-12-22 NOTE — Discharge Summary (Signed)
Physician Discharge Summary  Connie Haynes TGG:[id number] DOB: Oct 06, 1955 DOA: 12/18/2013  PCP: Ival Bible, MD  Admit date: 12/18/2013 Discharge date: 12/22/2013  Time spent: 30 minutes  Recommendations for Outpatient Follow-up:  1. Follow up with urology on Monday as recommended.   Discharge Diagnoses:  Principal Problem:   Left ureteral stone Active Problems:   UTI (urinary tract infection)   Discharge Condition: improved  Diet recommendation:regular.  Filed Weights   12/18/13 2255  Weight: 112 kg (246 lb 14.6 oz)    History of present illness: 58 year old female with history of hypertension,irritable bowel syndrome, history of kidney stones, depression, WPW syndrome, hypertension who presented to the ED with a severe flank pain noted prior to admission without any fever, dysuria. A CT scan in the ED showed left-sided hydronephrosis with possible small left UVJ stone. She was discharged home but returned back to the ED the same evening with dysuria and fever of 102F.with workup showing leukocytosis and UTI. Given renal stone with hydronephrosis and UTI and mild acute kidney injury she was transferred to [facility] for urology evaluation. Patient was taken to or for cystoscopy and left ureteral stenting done. No stone was found but had some inspissated pus which appear to be obstructing.   Hospital Course:  Sepsis secondary to UTI with acute obstructing left hydronephrosis Patient underwent cystoscopy with left ureteral stent placement. No stone was identified as per urology. -UA positive for UTI. Received one dose Zosyn in the ED and currently on empiric IV Rocephin.. . Urine cultures grew ecoli sensitive to cipro and keflex.  -Supportive care with IV fluids, pain control and antiemetics given during the hospitalization. Still has leukocytosis but afebrile and abdominal pain improving.  Acute kidney injury Mild likely secondary to hydronephrosis. Improved.    Hypertension Blood pressure stable. Continue carvedilol.   Leukocytosis: improving.   Continue remaining home medications.   Procedures:  Cystoscopy with stenting of left ureter  Consultations:  DR Jeffie Pollock  Discharge Exam: Filed Vitals:   12/22/13 1008  BP: 132/72  Pulse: 77  Temp:   Resp:     General: alert afebrile comfortable  Cardiovascular: s1s2 Respiratory:ctab  Discharge Instructions You were cared for by a hospitalist during your hospital stay. If you have any questions about your discharge medications or the care you received while you were in the hospital after you are discharged, you can call the unit and asked to speak with the hospitalist on call if the hospitalist that took care of you is not available. Once you are discharged, your primary care physician will handle any further medical issues. Please note that NO REFILLS for any discharge medications will be authorized once you are discharged, as it is imperative that you return to your primary care physician (or establish a relationship with a primary care physician if you do not have one) for your aftercare needs so that they can reassess your need for medications and monitor your lab values.  Discharge Instructions    Diet - low sodium heart healthy    Complete by:  As directed      Discharge instructions    Complete by:  As directed   Follow up with Urology as recommended.          Current Discharge Medication List    START taking these medications   Details  ciprofloxacin (CIPRO) 500 MG tablet Take 1 tablet (500 mg total) by mouth 2 (two) times daily. Qty: 10 tablet, Refills: 0  CONTINUE these medications which have CHANGED   Details  oxyCODONE-acetaminophen (PERCOCET) 5-325 MG per tablet Take 1-2 tablets by mouth every 6 (six) hours as needed. Qty: 15 tablet, Refills: 0    promethazine (PHENERGAN) 25 MG tablet Take 1 tab po at bedtime and then q 6 hrs as needed for nausea  vomiting Qty: 15 tablet, Refills: 0      CONTINUE these medications which have NOT CHANGED   Details  amLODipine (NORVASC) 5 MG tablet Take 1 tablet (5 mg total) by mouth daily. Qty: 30 tablet, Refills: 11    aspirin 81 MG tablet Take 81 mg by mouth daily.    carvedilol (COREG) 25 MG tablet Take 12.5 mg (1/2 tablet) twice a day Qty: 60 tablet, Refills: 3    celecoxib (CELEBREX) 200 MG capsule Take 1 capsule (200 mg total) by mouth daily. Qty: 30 capsule, Refills: 11   Associated Diagnoses: Chronic pain syndrome    Cholecalciferol (VITAMIN D) 1000 UNITS capsule Take 1,000 Units by mouth daily.     DULoxetine (CYMBALTA) 60 MG capsule Take 1 capsule (60 mg total) by mouth every morning. Qty: 30 capsule, Refills: 5   Associated Diagnoses: Anxiety state, unspecified; Chronic pain syndrome    losartan-hydrochlorothiazide (HYZAAR) 100-12.5 MG per tablet Take 1 tablet by mouth every morning. Qty: 30 tablet, Refills: 5    lubiprostone (AMITIZA) 24 MCG capsule Take 1 capsule (24 mcg total) by mouth 2 (two) times daily with a meal. Qty: 60 capsule, Refills: 11   Associated Diagnoses: Unspecified constipation    Multiple Vitamin (MULTIVITAMIN) tablet Take 1 tablet by mouth daily.    ondansetron (ZOFRAN ODT) 8 MG disintegrating tablet 8mg  ODT q4 hours prn nausea Qty: 8 tablet, Refills: 0    rOPINIRole (REQUIP) 1 MG tablet Take 1 tablet (1 mg total) by mouth at bedtime. Qty: 30 tablet, Refills: 11   Associated Diagnoses: Restless leg syndrome       Allergies  Allergen Reactions  . Codeine Other (See Comments)    vomiting   Follow-up Information    Follow up with Malka So, MD.   Specialty:  Urology   Why:  Please call office for a f/u appt for next week to have the stent removed.    Contact information:   [address] [place] [address] [phone]       Follow up with Ival Bible, MD.   Specialty:  Family Medicine   Why:  As needed   Contact information:    [address] [place] [address] [phone]        The results of significant diagnostics from this hospitalization (including imaging, microbiology, ancillary and laboratory) are listed below for reference.    Significant Diagnostic Studies: Ct Renal Stone Study  12/17/2013   CLINICAL DATA:  Severe left flank pain with microscopic hematuria for the past 8 hr. History of nephrolithiasis, hysterectomy and bilateral oophorectomy. Detached cecum repair with appendectomy.  EXAM: CT ABDOMEN AND PELVIS WITHOUT CONTRAST  TECHNIQUE: Multidetector CT imaging of the abdomen and pelvis was performed following the standard protocol without IV contrast.  COMPARISON:  Portable pelvis dated 08/02/2007.  No prior CTs.  FINDINGS: Mild dilatation of the the left renal collecting system and ureter to the level of the distal ureter. The ureter below that level is obscured by streak artifact produced by the bilateral hip prostheses. Left perinephric soft tissue stranding. Mild enlargement of the left kidney compared to the right kidney. The right  kidney and visualized portion of the right ureter have normal appearances. The urinary bladder is obscured by the streak artifacts. There are multiple small rounded calcific densities in the inferior left pelvis on the scout image. At least some of these were previously present.  Normal non contrasted appearance of the liver, spleen, pancreas, gallbladder and adrenal glands. Uterus and ovaries are surgically absent. The visualized bowel loops are unremarkable. No evidence of appendicitis. No enlarged lymph nodes. Mild linear scarring at both lung bases. Lumbar and lower thoracic spine degenerative changes.  IMPRESSION: 1. Mild left hydronephrosis and hydroureter to the level of the inferior pelvis. The distal ureter and urinary bladder are obscured by the streak artifact produced by the patient's bilateral hip prostheses. The hydronephrosis this most likely due to a  nonvisualized distal left ureteral calculus. 2. Otherwise, unremarkable examination.   Electronically Signed   By: Enrique Sack M.D.   On: 12/17/2013 13:13    Microbiology: Recent Results (from the past 240 hour(s))  Urine culture     Status: None   Collection Time: 12/18/13  5:32 PM  Result Value Ref Range Status   Specimen Description URINE, CLEAN CATCH  Final   Special Requests none Normal  Final   Culture  Setup Time   Final    12/19/2013 00:38 Performed at [place]   Final    30,000 COLONIES/ML Performed at [facility]    Culture   Final    ESCHERICHIA COLI Performed at [facility]    Report Status 12/21/2013 FINAL  Final   Organism ID, Bacteria ESCHERICHIA COLI  Final      Susceptibility   Escherichia coli - MIC*    AMPICILLIN >=32 RESISTANT Resistant     CEFAZOLIN <=4 SENSITIVE Sensitive     CEFTRIAXONE <=1 SENSITIVE Sensitive     CIPROFLOXACIN <=0.25 SENSITIVE Sensitive     GENTAMICIN <=1 SENSITIVE Sensitive     LEVOFLOXACIN <=0.12 SENSITIVE Sensitive     NITROFURANTOIN <=16 SENSITIVE Sensitive     TOBRAMYCIN <=1 SENSITIVE Sensitive     TRIMETH/SULFA >=320 RESISTANT Resistant     PIP/TAZO <=4 SENSITIVE Sensitive     * ESCHERICHIA COLI     Labs: Basic Metabolic Panel:  Recent Labs Lab 12/17/13 1212 12/18/13 1810 12/19/13 0425 12/20/13 0456  NA 142 136* 139 138  K 4.1 3.8 4.2 4.2  CL 104 97 103 104  CO2 23 23 21 22   GLUCOSE 125* 152* 160* 142*  BUN 21 26* 22 28*  CREATININE 1.00 1.30* 1.26* 1.25*  CALCIUM 9.7 8.7 8.8 8.9   Liver Function Tests:  Recent Labs Lab 12/17/13 1212  AST 18  ALT 19  ALKPHOS 86  BILITOT 0.5  PROT 7.2  ALBUMIN 4.2    Recent Labs Lab 12/17/13 1212  LIPASE 15   No results for input(s): AMMONIA in the last 168 hours. CBC:  Recent Labs Lab 12/17/13 1212 12/18/13 1810 12/19/13 0425 12/20/13 0456  WBC 17.4* 17.4* 17.3* 12.2*  NEUTROABS 14.8* 15.7*  --   --   HGB  12.5 10.9* 10.7* 10.0*  HCT 37.7 32.7* 32.0* 30.3*  MCV 91.3 91.9 90.1 91.3  PLT 306 225 199 191   Cardiac Enzymes: No results for input(s): CKTOTAL, CKMB, CKMBINDEX, TROPONINI in the last 168 hours. BNP: BNP (last 3 results) No results for input(s): PROBNP in the last 8760 hours. CBG: No results for input(s): GLUCAP in the last 168 hours.  SignedHosie Poisson  [facility] 12/22/2013, 2:32 PM

## 2013-12-22 NOTE — Progress Notes (Signed)
Physical Therapy Treatment Patient Details Name: Connie Haynes MRN: [id number] DOB: 1955-11-22 Today's Date: 12/22/2013    History of Present Illness 58 yo female s/p cystoscopy 12/19/13. 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     PT Comments    Pt feeling much better.  Already OOB, assisted with  amb in hallway using RW then practiced going up/down one step.  Pt hoping to D/C to home today.  Follow Up Recommendations  Home health PT;Supervision - Intermittent     Equipment Recommendations   (has all equipment from prior TKR)    Recommendations for Other Services       Precautions / Restrictions Precautions Precautions: Fall Restrictions Weight Bearing Restrictions: No    Mobility  Bed Mobility               General bed mobility comments: Pt OOB in recliner  Transfers Overall transfer level: Needs assistance Equipment used: Rolling walker (2 wheeled) Transfers: Sit to/from Stand Sit to Stand: Modified independent (Device/Increase time)         General transfer comment: good safety cognition and use of hands to steady self  Ambulation/Gait Ambulation/Gait assistance: Supervision Ambulation Distance (Feet): 85 Feet Assistive device: Rolling walker (2 wheeled) Gait Pattern/deviations: Step-through pattern Gait velocity: WFL slightly decreased   General Gait Details: increased gait distance and tolerance.  Still required use of RW due to mod c/o weakness/fatigue   Stairs            Wheelchair Mobility    Modified Rankin (Stroke Patients Only)       Balance                                    Cognition                            Exercises      General Comments        Pertinent Vitals/Pain Pain Assessment: No/denies pain    Home Living                      Prior Function            PT Goals (current goals can now be found in the care plan section) Progress towards PT goals: Progressing toward goals     Frequency  Min 3X/week    PT Plan      Co-evaluation             End of Session Equipment Utilized During Treatment: Gait belt Activity Tolerance: Patient tolerated treatment well Patient left: in chair;with call bell/phone within reach;with nursing/sitter in room     Time: 0922-0935 PT Time Calculation (min) (ACUTE ONLY): 13 min  Charges:  $Gait Training: 8-22 mins                    G Codes:      Rica Koyanagi  PTA [facility]  Acute  Rehab Pager      [phone]

## 2013-12-22 NOTE — Progress Notes (Signed)
[place], RN CM referred this patient to me yesterday based on the PT recommendation for [facility] PT. After verifying the patient's insurance, it was found that she has used 30/30 PT visits allotted through her insurance plan for the year. I informed the patient of this and let her know the private pay cost for PT is $175/visit. The patient decided she did not want to have [facility] PT at this time.   Lurlean Leyden 12/22/2013, 2:31 PM

## 2014-01-08 ENCOUNTER — Encounter (HOSPITAL_BASED_OUTPATIENT_CLINIC_OR_DEPARTMENT_OTHER): Payer: BC Managed Care – PPO

## 2014-01-22 ENCOUNTER — Encounter: Payer: Self-pay | Admitting: Pulmonary Disease

## 2014-01-22 ENCOUNTER — Ambulatory Visit (INDEPENDENT_AMBULATORY_CARE_PROVIDER_SITE_OTHER): Payer: BC Managed Care – PPO | Admitting: Pulmonary Disease

## 2014-01-22 ENCOUNTER — Other Ambulatory Visit: Payer: Self-pay | Admitting: Pulmonary Disease

## 2014-01-22 VITALS — BP 134/74 | HR 76 | Temp 97.7°F | Ht 65.0 in | Wt 240.0 lb

## 2014-01-22 DIAGNOSIS — G4733 Obstructive sleep apnea (adult) (pediatric): Secondary | ICD-10-CM

## 2014-01-22 NOTE — Assessment & Plan Note (Signed)
The patient has severe obstructive sleep apnea by her recent sleep study, and clearly has disrupted sleep at night but not a lot of sleepiness during the day. I have had a long discussion with her about sleep apnea, including its impact to her quality of life and cardiovascular health. I have recommended a trial of C Pap, and she is agreeable to this. I will set the patient up on cpap at a moderate pressure level to allow for desensitization, and will troubleshoot the device over the next 4-6weeks if needed.  The pt is to call me if having issues with tolerance.  Will then optimize the pressure once patient is able to wear cpap on a consistent basis.

## 2014-01-22 NOTE — Progress Notes (Signed)
Subjective:    Patient ID: Connie Haynes, female    DOB: 10/12/1955, 58 y.o.   MRN: [id number]  HPI The patient is a 58 year old female who I've been asked to see for management of obstructive sleep apnea. She has had a recent home sleep test in October of this year, and was found to have severe OSA with an AHI of 40 events per hour. The patient has been noted to have loud snoring, as well as witnessed apneas during her various hospital stays. She is also had occasional choking arousals. She has frequent awakenings at night, and is not rested in the mornings upon arising. Despite this, she denies any alertness issues during the day or night, and has no sleepiness with driving. Her Epworth score today is 0, and she tells me that her weight is neutral over the last 2 years.   Sleep Questionnaire What time do you typically go to bed?( Between what hours) 9-9:30 Pm 9-9:30 Pm at 1434 on 01/22/14 by Inge Rise, CMA How long does it take you to fall asleep? 20 min or more 20 min or more at 1434 on 01/22/14 by Inge Rise, CMA How many times during the night do you wake up? 4 4 at 1434 on 01/22/14 by Inge Rise, [employer] What time do you get out of bed to start your day? 0700 0700 at 1434 on 01/22/14 by Inge Rise, CMA Do you drive or operate heavy machinery in your occupation? No No at 1434 on 01/22/14 by Inge Rise, CMA How much has your weight changed (up or down) over the past two years? (In pounds) 0 oz (0 kg) 0 oz (0 kg) at 1434 on 01/22/14 by Inge Rise, CMA Have you ever had a sleep study before? Yes Yes at 1434 on 01/22/14 by [place] If yes, [facility] at 1434 on 01/22/14 by Inge Rise, CMA If yes, date of study? 11/2013 11/2013 at 1434 on 01/22/14 by Inge Rise, CMA Do you currently use CPAP? No No at 1434 on 01/22/14 by Inge Rise, CMA Do you wear oxygen at any time? No No at 1434 on 01/22/14 by Inge Rise,  CMA   Review of Systems  Constitutional: Negative for fever and unexpected weight change.  HENT: Negative for congestion, dental problem, ear pain, nosebleeds, postnasal drip, rhinorrhea, sinus pressure, sneezing, sore throat and trouble swallowing.   Eyes: Negative for redness and itching.  Respiratory: Positive for cough. Negative for chest tightness, shortness of breath and wheezing.   Cardiovascular: Positive for palpitations. Negative for leg swelling.  Gastrointestinal: Negative for nausea and vomiting.  Genitourinary: Negative for dysuria.  Musculoskeletal: Positive for arthralgias. Negative for joint swelling.  Skin: Negative for rash.  Neurological: Negative for headaches.  Hematological: Does not bruise/bleed easily.  Psychiatric/Behavioral: Negative for dysphoric mood. The patient is not nervous/anxious.        Objective:   Physical Exam Constitutional:  Overweight female, no acute distress  HENT:  Nares patent without discharge  Oropharynx without exudate, palate and uvula are mildly elongated  Eyes:  Perrla, eomi, no scleral icterus  Neck:  No JVD, no TMG  Cardiovascular:  Normal rate, regular rhythm, no rubs or gallops.  No murmurs        Intact distal pulses  Pulmonary :  Normal breath sounds, no stridor or respiratory distress   No rales, rhonchi, or wheezing  Abdominal:  Soft,  nondistended, bowel sounds present.  No tenderness noted.   Musculoskeletal:  No lower extremity edema noted.  Lymph Nodes:  No cervical lymphadenopathy noted  Skin:  No cyanosis noted  Neurologic:  Alert, appropriate, moves all 4 extremities without obvious deficit.         Assessment & Plan:

## 2014-01-22 NOTE — Patient Instructions (Signed)
Will start on cpap at a moderate level.  Please call if having tolerance issues. Work on weight loss followup with me again in 8 weeks.

## 2014-03-19 ENCOUNTER — Encounter: Payer: Self-pay | Admitting: Pulmonary Disease

## 2014-03-19 ENCOUNTER — Ambulatory Visit (INDEPENDENT_AMBULATORY_CARE_PROVIDER_SITE_OTHER): Payer: BC Managed Care – PPO | Admitting: Pulmonary Disease

## 2014-03-19 VITALS — BP 122/66 | HR 55 | Temp 97.0°F | Ht 65.0 in | Wt 241.0 lb

## 2014-03-19 DIAGNOSIS — G4733 Obstructive sleep apnea (adult) (pediatric): Secondary | ICD-10-CM

## 2014-03-19 NOTE — Patient Instructions (Signed)
Will send an order to your home care company to work with you on mask fit. Stay on cpap for now, but call in 3-4 weeks with feedback with how things are going.  If you continue to have issues, will change you to a bilevel device.  Work on weight loss Schedule a followup with me again in 66mos.

## 2014-03-19 NOTE — Progress Notes (Signed)
   Subjective:    Patient ID: Connie Haynes, female    DOB: [birth year], 59 y.o.   MRN: [id number]  HPI The patient comes in today for follow-up of her obstructive sleep apnea. She is wearing C Pap compliantly by her download, but is having significant issues with pressure tolerance. She is having to turn her machine off and let it start at lower pressure multiple times during the night, and her high pressure is only 12 cm. She is also having significant problems with mask leak, which leads to an elevated AHI at times. Does not feel that she has slept well.   Review of Systems  Constitutional: Negative for fever and unexpected weight change.  HENT: Negative for congestion, dental problem, ear pain, nosebleeds, postnasal drip, rhinorrhea, sinus pressure, sneezing, sore throat and trouble swallowing.   Eyes: Negative for redness and itching.  Respiratory: Negative for cough, chest tightness, shortness of breath and wheezing.   Cardiovascular: Negative for palpitations and leg swelling.  Gastrointestinal: Negative for nausea and vomiting.  Genitourinary: Negative for dysuria.  Musculoskeletal: Negative for joint swelling.  Skin: Negative for rash.  Neurological: Negative for headaches.  Hematological: Does not bruise/bleed easily.  Psychiatric/Behavioral: Negative for dysphoric mood. The patient is not nervous/anxious.        Objective:   Physical Exam Obese female in no acute distress Nose without purulence or discharge noted No skin breakdown or pressure necrosis from the C Pap mask Neck without lymphadenopathy or thyromegaly Lower extremities with mild edema, no cyanosis Alert and oriented, moves all 4 extremities.       Assessment & Plan:

## 2014-03-19 NOTE — Assessment & Plan Note (Signed)
The patient has severe obstructive sleep apnea, and has been wearing C Pap compliantly since the last visit. She is having significant issues with pressure tolerance, despite only being at a moderate level. I suspect she is going to have to be changed to bilevel, but she is willing to give this a little more time with a better fitting mask. Also encouraged her to work aggressively on weight loss. We will get her referred to her home care company to work on mask fit.

## 2014-03-20 ENCOUNTER — Other Ambulatory Visit: Payer: Self-pay | Admitting: Gastroenterology

## 2014-03-20 DIAGNOSIS — R112 Nausea with vomiting, unspecified: Secondary | ICD-10-CM

## 2014-04-05 ENCOUNTER — Ambulatory Visit (HOSPITAL_COMMUNITY)
Admission: RE | Admit: 2014-04-05 | Discharge: 2014-04-05 | Disposition: A | Discharge status: Home / Self Care | Payer: BC Managed Care – PPO | Source: Ambulatory Visit | Attending: Gastroenterology | Admitting: Gastroenterology

## 2014-04-05 DIAGNOSIS — R112 Nausea with vomiting, unspecified: Secondary | ICD-10-CM | POA: Diagnosis present

## 2014-04-05 MED ORDER — SINCALIDE 5 MCG IJ SOLR
0.0200 ug/kg | Freq: Once | INTRAMUSCULAR | Status: AC
  Administered 2014-04-05: 2.2 ug via INTRAVENOUS

## 2014-04-05 MED ORDER — TECHNETIUM TC 99M MEBROFENIN IV KIT
5.4000 | PACK | Freq: Once | INTRAVENOUS | Status: AC | PRN
  Administered 2014-04-05: 5 via INTRAVENOUS

## 2014-04-30 ENCOUNTER — Encounter (HOSPITAL_COMMUNITY): Payer: Self-pay | Admitting: *Deleted

## 2014-05-05 ENCOUNTER — Other Ambulatory Visit: Payer: Self-pay | Admitting: Cardiology

## 2014-05-07 ENCOUNTER — Telehealth: Payer: Self-pay | Admitting: Internal Medicine

## 2014-05-07 MED ORDER — CARVEDILOL 25 MG PO TABS: ORAL_TABLET | ORAL | Status: DC

## 2014-05-07 MED ORDER — LOSARTAN POTASSIUM-HCTZ 100-12.5 MG PO TABS: 1.0000 | ORAL_TABLET | Freq: Every morning | ORAL | Status: DC

## 2014-05-07 NOTE — Telephone Encounter (Signed)
Patient was asking for refills on Carvedilol and Hyzaar until she sees Dr. Caryl Comes in May. Informed patient rx sent for requested meds. Rx sent for 3 months, sent to [facility]. Patient thanked me for helping.

## 2014-05-07 NOTE — Telephone Encounter (Signed)
New Message  Pt wanted to speak w/ rn about medication. Pt made appt w/ Dr. Caryl Comes for 5/5 @ 9:30. Please call back and discuss.

## 2014-05-09 NOTE — Anesthesia Preprocedure Evaluation (Signed)
Anesthesia Evaluation  Patient identified by MRN, date of birth, ID band Patient awake    Reviewed: Allergy & Precautions, NPO status , Patient's Chart, lab work & pertinent test results  History of Anesthesia Complications (+) PONV and history of anesthetic complications  Airway Mallampati: II  TM Distance: >3 FB Neck ROM: Full    Dental no notable dental hx. (+) Dental Advisory Given   Pulmonary sleep apnea and Continuous Positive Airway Pressure Ventilation ,  breath sounds clear to auscultation  Pulmonary exam normal       Cardiovascular hypertension, Pt. on medications and Pt. on home beta blockers + dysrhythmias Rhythm:Regular Rate:Normal  Hx of wpw s/p ablation   Neuro/Psych PSYCHIATRIC DISORDERS Anxiety Depression negative neurological ROS     GI/Hepatic negative GI ROS, Neg liver ROS,   Endo/Other  negative endocrine ROS  Renal/GU Renal disease  negative genitourinary   Musculoskeletal  (+) Arthritis -, Osteoarthritis,    Abdominal   Peds negative pediatric ROS (+)  Hematology negative hematology ROS (+)   Anesthesia Other Findings   Reproductive/Obstetrics negative OB ROS                             Anesthesia Physical Anesthesia Plan  ASA: III  Anesthesia Plan: MAC   Post-op Pain Management:    Induction: Intravenous  Airway Management Planned: Nasal Cannula  Additional Equipment:   Intra-op Plan:   Post-operative Plan:   Informed Consent: I have reviewed the patients History and Physical, chart, labs and discussed the procedure including the risks, benefits and alternatives for the proposed anesthesia with the patient or authorized representative who has indicated his/her understanding and acceptance.   Dental advisory given  Plan Discussed with: CRNA  Anesthesia Plan Comments:         Anesthesia Quick Evaluation

## 2014-05-10 ENCOUNTER — Ambulatory Visit (HOSPITAL_COMMUNITY)
Admission: RE | Admit: 2014-05-10 | Discharge: 2014-05-10 | Disposition: A | Discharge status: Home / Self Care | Payer: BC Managed Care – PPO | Source: Ambulatory Visit | Attending: Gastroenterology | Admitting: Gastroenterology

## 2014-05-10 ENCOUNTER — Ambulatory Visit (HOSPITAL_COMMUNITY): Payer: BC Managed Care – PPO | Admitting: Anesthesiology

## 2014-05-10 ENCOUNTER — Encounter (HOSPITAL_COMMUNITY): Payer: Self-pay | Admitting: Gastroenterology

## 2014-05-10 ENCOUNTER — Encounter (HOSPITAL_COMMUNITY)
Admission: RE | Disposition: A | Discharge status: Home / Self Care | Payer: Self-pay | Source: Ambulatory Visit | Attending: Gastroenterology

## 2014-05-10 DIAGNOSIS — Z90710 Acquired absence of both cervix and uterus: Secondary | ICD-10-CM | POA: Diagnosis not present

## 2014-05-10 DIAGNOSIS — R112 Nausea with vomiting, unspecified: Secondary | ICD-10-CM | POA: Diagnosis present

## 2014-05-10 DIAGNOSIS — Z96651 Presence of right artificial knee joint: Secondary | ICD-10-CM | POA: Diagnosis not present

## 2014-05-10 DIAGNOSIS — F329 Major depressive disorder, single episode, unspecified: Secondary | ICD-10-CM | POA: Insufficient documentation

## 2014-05-10 DIAGNOSIS — Z7982 Long term (current) use of aspirin: Secondary | ICD-10-CM | POA: Diagnosis not present

## 2014-05-10 DIAGNOSIS — F419 Anxiety disorder, unspecified: Secondary | ICD-10-CM | POA: Insufficient documentation

## 2014-05-10 DIAGNOSIS — Z791 Long term (current) use of non-steroidal anti-inflammatories (NSAID): Secondary | ICD-10-CM | POA: Insufficient documentation

## 2014-05-10 DIAGNOSIS — G4730 Sleep apnea, unspecified: Secondary | ICD-10-CM | POA: Diagnosis not present

## 2014-05-10 DIAGNOSIS — I10 Essential (primary) hypertension: Secondary | ICD-10-CM | POA: Insufficient documentation

## 2014-05-10 DIAGNOSIS — K589 Irritable bowel syndrome without diarrhea: Secondary | ICD-10-CM | POA: Insufficient documentation

## 2014-05-10 DIAGNOSIS — Z9989 Dependence on other enabling machines and devices: Secondary | ICD-10-CM | POA: Insufficient documentation

## 2014-05-10 DIAGNOSIS — Z96643 Presence of artificial hip joint, bilateral: Secondary | ICD-10-CM | POA: Insufficient documentation

## 2014-05-10 DIAGNOSIS — Z6839 Body mass index (BMI) 39.0-39.9, adult: Secondary | ICD-10-CM | POA: Insufficient documentation

## 2014-05-10 DIAGNOSIS — Z79899 Other long term (current) drug therapy: Secondary | ICD-10-CM | POA: Insufficient documentation

## 2014-05-10 DIAGNOSIS — K2960 Other gastritis without bleeding: Secondary | ICD-10-CM | POA: Insufficient documentation

## 2014-05-10 DIAGNOSIS — Z79891 Long term (current) use of opiate analgesic: Secondary | ICD-10-CM | POA: Diagnosis not present

## 2014-05-10 DIAGNOSIS — Z1211 Encounter for screening for malignant neoplasm of colon: Secondary | ICD-10-CM | POA: Diagnosis present

## 2014-05-10 DIAGNOSIS — Z96612 Presence of left artificial shoulder joint: Secondary | ICD-10-CM | POA: Insufficient documentation

## 2014-05-10 DIAGNOSIS — K828 Other specified diseases of gallbladder: Secondary | ICD-10-CM | POA: Diagnosis not present

## 2014-05-10 DIAGNOSIS — M19011 Primary osteoarthritis, right shoulder: Secondary | ICD-10-CM | POA: Insufficient documentation

## 2014-05-10 DIAGNOSIS — K219 Gastro-esophageal reflux disease without esophagitis: Secondary | ICD-10-CM | POA: Diagnosis not present

## 2014-05-10 HISTORY — PX: ESOPHAGOGASTRODUODENOSCOPY (EGD) WITH PROPOFOL: SHX5813

## 2014-05-10 HISTORY — DX: Sleep apnea, unspecified: G47.30

## 2014-05-10 HISTORY — PX: COLONOSCOPY WITH PROPOFOL: SHX5780

## 2014-05-10 SURGERY — ESOPHAGOGASTRODUODENOSCOPY (EGD) WITH PROPOFOL
Anesthesia: Monitor Anesthesia Care

## 2014-05-10 MED ORDER — LIDOCAINE HCL (CARDIAC) 20 MG/ML IV SOLN
INTRAVENOUS | Status: AC
  Filled 2014-05-10: qty 5

## 2014-05-10 MED ORDER — LIDOCAINE HCL (CARDIAC) 20 MG/ML IV SOLN
INTRAVENOUS | Status: DC | PRN
  Administered 2014-05-10: 75 mg via INTRAVENOUS

## 2014-05-10 MED ORDER — GLYCOPYRROLATE 0.2 MG/ML IJ SOLN
INTRAMUSCULAR | Status: AC
  Filled 2014-05-10: qty 1

## 2014-05-10 MED ORDER — PROPOFOL 10 MG/ML IV BOLUS
INTRAVENOUS | Status: AC
  Filled 2014-05-10: qty 20

## 2014-05-10 MED ORDER — ONDANSETRON HCL 4 MG/2ML IJ SOLN
INTRAMUSCULAR | Status: AC
  Filled 2014-05-10: qty 2

## 2014-05-10 MED ORDER — PROPOFOL INFUSION 10 MG/ML OPTIME
INTRAVENOUS | Status: DC | PRN
  Administered 2014-05-10: 300 ug/kg/min via INTRAVENOUS

## 2014-05-10 MED ORDER — LABETALOL HCL 5 MG/ML IV SOLN
INTRAVENOUS | Status: DC | PRN
  Administered 2014-05-10: 5 mg via INTRAVENOUS

## 2014-05-10 MED ORDER — LACTATED RINGERS IV SOLN
INTRAVENOUS | Status: DC
  Administered 2014-05-10: 1000 mL via INTRAVENOUS

## 2014-05-10 MED ORDER — GLYCOPYRROLATE 0.2 MG/ML IJ SOLN
INTRAMUSCULAR | Status: DC | PRN
  Administered 2014-05-10: 0.2 mg via INTRAVENOUS

## 2014-05-10 MED ORDER — SODIUM CHLORIDE 0.9 % IV SOLN: INTRAVENOUS | Status: DC

## 2014-05-10 MED ORDER — LIDOCAINE VISCOUS 2 % MT SOLN
OROMUCOSAL | Status: AC
  Filled 2014-05-10: qty 15

## 2014-05-10 MED ORDER — ONDANSETRON HCL 4 MG/2ML IJ SOLN
INTRAMUSCULAR | Status: DC | PRN
  Administered 2014-05-10: 4 mg via INTRAVENOUS

## 2014-05-10 SURGICAL SUPPLY — 24 items

## 2014-05-10 NOTE — Anesthesia Postprocedure Evaluation (Signed)
  Anesthesia Post-op Note  Patient: Connie Haynes  Procedure(s) Performed: Procedure(s) (LRB): ESOPHAGOGASTRODUODENOSCOPY (EGD) WITH PROPOFOL (N/A) COLONOSCOPY WITH PROPOFOL (N/A)  Patient Location: PACU  Anesthesia Type: MAC  Level of Consciousness: awake and alert   Airway and Oxygen Therapy: Patient Spontanous Breathing  Post-op Pain: mild  Post-op Assessment: Post-op Vital signs reviewed, Patient's Cardiovascular Status Stable, Respiratory Function Stable, Patent Airway and No signs of Nausea or vomiting  Last Vitals:  Filed Vitals:   05/10/14 0830  BP: 198/96  Pulse: 67  Temp:   Resp: 19    Post-op Vital Signs: stable   Complications: No apparent anesthesia complications

## 2014-05-10 NOTE — H&P (Signed)
Connie Haynes is an 59 y.o. female.   Chief Complaint: Colorectal cancer screening. HPI: 59 year old white female with multiple medical problems listed below, here for an EGD/Colonoscopy. See office notes for details.   Past Medical History  Diagnosis Date  . Wolff-Parkinson-White (WPW) syndrome   . Hypertension   . Hyperlipidemia   . IBS (irritable bowel syndrome)     controlled with med  . Kidney stones   . PONV (postoperative nausea and vomiting)     " woke up to soon"  . Cough     hx. dry cough chronic  . Depression   . Disorder of tear duct system     frequent tearing of eyes  . Osteoarthritis     arthritis -joints-limited ROM shoulders more on right  . Sleep apnea     cpap use settings" 5-15 automatic"   Past Surgical History  Procedure Laterality Date  . Tear duct surgery    . Cervical spine surgery      cervical fusion with hardware retained  . Total abdominal hysterectomy    . Total hip arthroplasty      x 3(rt x2, lt x1)  . Ablation surgery      1993 [facility] (Dr. Hulen Skains)  . Eyelid carcinoma excision    . Joint replacement    . Cervical spine surgery    . Total shoulder arthroplasty Left 07/15/2012    Procedure: LEFT TOTAL SHOULDER ARTHROPLASTY;  Surgeon: Augustin Schooling, MD;  Location: [facility];  Service: Orthopedics;  Laterality: Left;  . Colon surgery      "colon detached from abdomen"  . Cardiac electrophysiology study and ablation      '93 for WPW syndrome  . Total knee arthroplasty Right 08/08/2013    Procedure: RIGHT TOTAL KNEE ARTHROPLASTY WITH SAPHENOUS NERVE SCAR EXCISION;  Surgeon: Mauri Pole, MD;  Location: [facility];  Service: Orthopedics;  Laterality: Right;  . Ureteroscopy    . Cystoscopy/retrograde/ureteroscopy Left 12/18/2013    Procedure: CYSTOSCOPY/RETROGRADE/LEFT STENT;  Surgeon: Malka So, MD;  Location: [facility];  Service: Urology;  Laterality: Left;   Family History  Problem Relation Age of Onset  . Leukemia Brother   . Heart disease  Mother   . Hypertension Mother   . Hyperlipidemia Mother   . Heart disease Maternal Grandfather   . Cancer Paternal Grandfather   . Lung cancer Paternal Grandfather   . Cancer Maternal Uncle     bone   Social History:  reports that she has never smoked. She has never used smokeless tobacco. She reports that she does not drink alcohol or use illicit drugs.  Allergies:  Allergies  Allergen Reactions  . Codeine Other (See Comments)    vomiting   Medications Prior to Admission  Medication Sig Dispense Refill  . amLODipine (NORVASC) 5 MG tablet Take 1 tablet (5 mg total) by mouth daily. 30 tablet 11  . aspirin 81 MG tablet Take 81 mg by mouth every morning.     . carvedilol (COREG) 25 MG tablet TAKE ONE TABLET BY MOUTH TWICE DAILY 60 tablet 0  . carvedilol (COREG) 25 MG tablet Take 12.5 mg (1/2 tablet) twice a day 60 tablet 3  . celecoxib (CELEBREX) 200 MG capsule Take 1 capsule (200 mg total) by mouth daily. 30 capsule 11  . DULoxetine (CYMBALTA) 60 MG capsule Take 1 capsule (60 mg total) by mouth every morning. 30 capsule 5  . esomeprazole (NEXIUM) 20 MG capsule Take 20  mg by mouth daily as needed (indigestion.).    Marland Kitchen losartan-hydrochlorothiazide (HYZAAR) 100-12.5 MG per tablet Take 1 tablet by mouth every morning. 30 tablet 3  . lubiprostone (AMITIZA) 24 MCG capsule Take 1 capsule (24 mcg total) by mouth 2 (two) times daily with a meal. 60 capsule 11  . Multiple Vitamin (MULTIVITAMIN) tablet Take 1 tablet by mouth every morning.     . promethazine (PHENERGAN) 25 MG tablet Take 1 tab po at bedtime and then q 6 hrs as needed for nausea vomiting 15 tablet 0  . rOPINIRole (REQUIP) 1 MG tablet Take 1 tablet (1 mg total) by mouth at bedtime. 30 tablet 11  . oxyCODONE-acetaminophen (PERCOCET) 5-325 MG per tablet Take 1-2 tablets by mouth every 6 (six) hours as needed. (Patient not taking: Reported on 04/18/2014) 15 tablet 0   No results found for this or any previous visit (from the past 48  hour(s)). No results found.  Review of Systems  Constitutional: Negative.   HENT: Negative.   Eyes: Negative.   Respiratory: Negative.   Cardiovascular: Negative.   Gastrointestinal: Positive for heartburn, nausea, vomiting and constipation. Negative for blood in stool and melena.  Genitourinary: Negative.   Musculoskeletal: Positive for joint pain.  Skin: Negative.   Psychiatric/Behavioral: Positive for depression. Negative for suicidal ideas, hallucinations, memory loss and substance abuse. The patient is nervous/anxious. The patient does not have insomnia.    Blood pressure 167/86, pulse 58, temperature 97.9 F (36.6 C), temperature source Oral, resp. rate 11, height 5\' 5"  (1.651 m), weight 108.863 kg (240 lb), SpO2 99 %. Physical Exam  Constitutional: She is oriented to person, place, and time. She appears well-developed and well-nourished.  Morbidly obese  HENT:  Head: Normocephalic and atraumatic.  Eyes: Conjunctivae and EOM are normal. Pupils are equal, round, and reactive to light.  Neck: Normal range of motion. Neck supple.  Cardiovascular: Normal rate and regular rhythm.   Respiratory: Effort normal and breath sounds normal.  GI: Soft. Bowel sounds are normal.  Musculoskeletal: Normal range of motion.  Neurological: She is alert and oriented to person, place, and time.  Skin: Skin is warm and dry.  Psychiatric: She has a normal mood and affect. Her behavior is normal. Judgment and thought content normal.    Assessment/Plan Colorectal cancer screening/GERD/Nausea & vomiting/Dysphagia: proceed with an EGD/Colonoscopy at this time.  Jameer Storie 05/10/2014, 7:26 AM

## 2014-05-10 NOTE — Transfer of Care (Signed)
Immediate Anesthesia Transfer of Care Note  Patient: Connie Haynes  Procedure(s) Performed: Procedure(s): ESOPHAGOGASTRODUODENOSCOPY (EGD) WITH PROPOFOL (N/A) COLONOSCOPY WITH PROPOFOL (N/A)  Patient Location: PACU  Anesthesia Type:MAC  Level of Consciousness: awake, alert , oriented and patient cooperative  Airway & Oxygen Therapy: Patient Spontanous Breathing and Patient connected to nasal cannula oxygen  Post-op Assessment: Report given to RN, Post -op Vital signs reviewed and stable and Patient moving all extremities X 4  Post vital signs: stable  Last Vitals:  Filed Vitals:   05/10/14 0804  BP: 141/96  Pulse: 76  Temp:   Resp: 22    Complications: No apparent anesthesia complications

## 2014-05-10 NOTE — Discharge Instructions (Signed)
Colonoscopy, Care After °These instructions give you information on caring for yourself after your procedure. Your doctor may also give you more specific instructions. Call your doctor if you have any problems or questions after your procedure. °HOME CARE °· Do not drive for 24 hours. °· Do not sign important papers or use machinery for 24 hours. °· You may shower. °· You may go back to your usual activities, but go slower for the first 24 hours. °· Take rest breaks often during the first 24 hours. °· Walk around or use warm packs on your belly (abdomen) if you have belly cramping or gas. °· Drink enough fluids to keep your pee (urine) clear or pale yellow. °· Resume your normal diet. Avoid heavy or fried foods. °· Avoid drinking alcohol for 24 hours or as told by your doctor. °· Only take medicines as told by your doctor. °If a tissue sample (biopsy) was taken during the procedure:  °· Do not take aspirin or blood thinners for 7 days, or as told by your doctor. °· Do not drink alcohol for 7 days, or as told by your doctor. °· Eat soft foods for the first 24 hours. °GET HELP IF: °You still have a small amount of blood in your poop (stool) 2-3 days after the procedure. °GET HELP RIGHT AWAY IF: °· You have more than a small amount of blood in your poop. °· You see clumps of tissue (blood clots) in your poop. °· Your belly is puffy (swollen). °· You feel sick to your stomach (nauseous) or throw up (vomit). °· You have a fever. °· You have belly pain that gets worse and medicine does not help. °MAKE SURE YOU: °· Understand these instructions. °· Will watch your condition. °· Will get help right away if you are not doing well or get worse. °Document Released: 02/28/2010 Document Revised: 01/31/2013 Document Reviewed: 10/03/2012 °[employer]® Patient Information ©2015 [employer]. This information is not intended to replace advice given to you by your health care provider. Make sure you discuss any questions you have with  your health care provider. ° °

## 2014-05-11 ENCOUNTER — Encounter (HOSPITAL_COMMUNITY): Payer: Self-pay | Admitting: Gastroenterology

## 2014-05-14 ENCOUNTER — Telehealth: Payer: Self-pay | Admitting: Pulmonary Disease

## 2014-05-14 DIAGNOSIS — G4733 Obstructive sleep apnea (adult) (pediatric): Secondary | ICD-10-CM

## 2014-05-14 NOTE — Telephone Encounter (Signed)
Spoke with patient-she would like to know if [facility] will adjust her pressure to 5-12 and see how she does; states the pressure is waking her up.   [place] please advise. Thanks.

## 2014-05-14 NOTE — Telephone Encounter (Signed)
Spoke with patient-she feels the true trouble could be coming from her pressure settings. Just not sleeping well at night and waking tired. I have printed Download from Eugenio Saenz and placed on your desk.   DME: [facility]  [place] please advise.

## 2014-05-14 NOTE — Telephone Encounter (Signed)
Let her know that I have reviewed her download.  This shows issues with mask leak the first 10 days of Feb, but now had been doing very well.  Her sleep apnea is completely controlled, her download looks very good.  She is needing between 8-13 of pressure.  If she wants to try a different mask for comfort, would be happy to send an order.

## 2014-05-15 NOTE — Telephone Encounter (Signed)
Spoke with pt and advised that order was sent to [facility] to change pressure per Dr Gwenette Greet.

## 2014-05-15 NOTE — Telephone Encounter (Signed)
Ok to send order to change pressure to 5-12 on auto

## 2014-05-16 NOTE — Op Note (Signed)
[facility] [place], [address]   OPERATIVE PROCEDURE REPORT  PATIENT: Connie, Haynes  MR#: [id number] BIRTHDATE: [birth year] GENDER: female ENDOSCOPIST: Edmonia James, MD ASSISTANT:   Elspeth Cho, technician & Laverta Baltimore, RN PROCEDURE DATE: 05/10/2014 PRE-PROCEDURE PREPARATION: Patient fasted for 4 hours prior to procedure. The patient was prepped with a gallon of Golytely the night prior to the procedure. The patient has fasted for 4 hours prior to the procedure PRE-PROCEDURE PHYSICAL: Patient has stable vital signs.  Neck is supple.  There is no JVD, thyromegaly or LAD.  Chest clear to auscultation.  S1 and S2 regular.  Abdomen soft, non-distended, non-tender with NABS. PROCEDURE:     Colonoscopy, diagnostic ASA CLASS:     Class III INDICATIONS:     Colorectal cancer screening-average risk patient for colon cancer. MEDICATIONS:     Monitored anesthesia care  DESCRIPTION OF PROCEDURE:   After the risks, benefits, and alternatives of the procedure were thoroughly explained [including a 10% missed rate of cancer and polyps], informed consent was obtained.  Digital rectal exam was performed.  The Pentax Adult Colon [id number]  was introduced through the anus  and advanced to the cecum, which was identified by both the appendix and ileocecal valve , limited by No adverse events experienced.  The quality of the prep was adequate . Multiple washes were done. Small lesions could be missed. The instrument was then slowly withdrawn as the colon was fully examined. Estimated blood loss is zero unless otherwise noted in this procedure report. Images could not be captured due to technical problems.     COLON FINDINGS: A normal appearing cecum, ileocecal valve, and appendiceal orifice were identified.  The ascending, transverse, descending, sigmoid colon, and rectum appeared unremarkable. The entire colonic mucosa appeared healthy with a normal  vascular pattern. No masses, polyps, diverticula or AVMs were noted. The appendiceal orifice and the ICV were identified and photographed. The terminal ileum appeared normal.  Retroflexed views revealed no abnormalities.  The patient tolerated the procedure without immediate complications. The scope was then withdrawn from the patient and the procedure terminated.  TIME TO CECUM:   5 minutes 00 seconds WITHDRAW TIME:  6 minutes 00 seconds  IMPRESSION:     Normal colonoscopy upto the ceum.  RECOMMENDATIONS:     1.  Continue current medications. 2.  Continue surveillance. 3.  High fiber diet with liberal fluid intake. 4.  OP follow-up is advised on a PRN basis.  REPEAT EXAM:      In 10 years  for a repeat colonoscopy.  If the patient has any abnormal GI symptoms in the interim, she have been advised to contact the office as soon as possible for further recommendations.   REFERRED PV:XYIAX Zanard, M.D.  eSigned:  Edmonia James, MD 2014/06/13 4:20 PM   CPT CODES:     [phone] Colonoscopy, flexible, proximal to splenic flexure; diagnostic, with or without collection of specimen(s) by brushing or washing, with or without colon decompression (separate procedure) ICD CODES:     Z12.11 Encounter for screening for malignant neoplasm of colon  The ICD and CPT codes recommended by this software are interpretations from the data that the clinical staff has captured with the software.  The verification of the translation of this report to the ICD and CPT codes and modifiers is the sole responsibility of the health care institution and practicing physician where this report was generated.  [place]. will not  be held responsible for the validity of the ICD and CPT codes included on this report.  AMA assumes no liability for data contained or not contained herein. CPT is a Designer, television/film set of the [employer].

## 2014-05-16 NOTE — Op Note (Signed)
[facility] [place], [address]   OPERATIVE PROCEDURE REPORT  PATIENT :Connie Haynes, Connie Haynes  MR#: [id number] BIRTHDATE :14-Apr-1955 GENDER: female ENDOSCOPIST: Edmonia James, MD ASSISTANT:   Elspeth Cho, technician Laverta Baltimore PROCEDURE DATE: 05/10/2014 PRE-PROCEDURE PREPERATION: Patient fasted for 4 hours prior to procedure. PRE-PROCEDURE PHYSICAL: Patient has stable vital signs.  Neck is supple.  There is no JVD, thyromegaly or LAD.  Chest clear to auscultation.  S1 and S2 regular.  Abdomen soft, non-distended, non-tender with NABS. PROCEDURE:     EGD, diagnostic ASA CLASS:     Class III INDICATIONS:     1) Nausea & vomiting 2) GERD 3) Biliary dyskinesia.  MEDICATIONS:     Monitored anesthesia care TOPICAL ANESTHETIC:   Viscous lidocaine-10 cc PO.  DESCRIPTION OF PROCEDURE: After the risks benefits and alternatives of the procedure were thoroughly explained, informed consent was obtained.  The Pentax Gastroscope [id number]  was introduced through the mouth and advanced to the second portion of the duodenum , without limitations. The instrument was slowly withdrawn as the mucosa was fully examined. Estimated blood loss is zero unless otherwise noted in this procedure report. Images were not available as there was a technical problems with the equipment.      STOMACH: Mild antral gastritis; otherwise normal EGD. Except for mild antral gastritis, the esophagus, stomach and the proximal small bowel appeared normal. There were no ulcers, erosions, masses or polyps noted. Retroflexed views revealed no abnormalities. The scope was then withdrawn from the patient and the procedure terminated. The patient tolerated the procedure without immediate complications.  IMPRESSION:  Mild antral gastritis; otherwise normal EGD.  RECOMMENDATIONS:     1.  Anti-reflux regimen to be followed. 2.  Continue current medications. 3.  Continue PPI's. 4.  OP  follow-up is advised on a PRN basis. 5. Surgical evaluation for biliary dyskinesia. 6. Low fat diet.  REPEAT EXAM:  no repeat exam necessary.  DISCHARGE INSTRUCTIONS: standard discharge instruction sgiven. _______________________________ eSignedEdmonia James, MD 06/06/2014 4:29 PM   CPT CODES:     [id number] Upper gastrointestinal endoscopy including esophagus, stomach, and either the duodenum and/or jejunum as appropriate; diagnostic, with or without collection of specimen(s) by brushing or washing (separate procedure) DIAGNOSIS CODES:     R11.2, Nausea & vomiting K21.9, GERD K82.8 Biliary dyskinesia a K29.00 Acute gastritis without bleeding  The ICD and CPT codes recommended by this software are interpretations from the data that the clinical staff has captured with the software.  The verification of the translation of this report to the ICD and CPT codes and modifiers is the sole responsibility of the health care institution and practicing physician where this report was generated.  [place]. will not be held responsible for the validity of the ICD and CPT codes included on this report.  AMA assumes no liability for data contained or not contained herein. CPT is a Designer, television/film set of the [employer].   CC: Ival Bible, M.D.

## 2014-06-14 ENCOUNTER — Ambulatory Visit (INDEPENDENT_AMBULATORY_CARE_PROVIDER_SITE_OTHER): Payer: BC Managed Care – PPO | Admitting: Internal Medicine

## 2014-06-14 ENCOUNTER — Encounter: Payer: Self-pay | Admitting: Internal Medicine

## 2014-06-14 VITALS — BP 130/80 | HR 58 | Ht 65.0 in | Wt 247.4 lb

## 2014-06-14 DIAGNOSIS — R002 Palpitations: Secondary | ICD-10-CM

## 2014-06-14 MED ORDER — CARVEDILOL 25 MG PO TABS: 25.0000 mg | ORAL_TABLET | Freq: Two times a day (BID) | ORAL | Status: DC

## 2014-06-14 MED ORDER — AMLODIPINE BESYLATE 5 MG PO TABS: 5.0000 mg | ORAL_TABLET | Freq: Every day | ORAL | Status: DC

## 2014-06-14 NOTE — Progress Notes (Signed)
Patient Care Team: [name], MD as PCP - General (Family Medicine) Elsie Stain, MD as Attending Physician (Pulmonary Disease)   HPI  Connie Haynes is a 59 y.o. female Seen in followup for chest pain sleep and disordered breathing and a history of ablation for WPW with recurrent palpitations.  Myoview scanning was normal 9/15;  sleep study was abnormal 11/15 with an AHI of 39 she saw Dr. Gwenette Greet. Unfortunately  CPAP had no symptomatic benefit. Her blood pressure remains well-controlled.  She is scheduled to undergo shoulder surgery next month.  She notes bruising on her arms. She has stopped her aspirin on her own. This is improved somewhat. She has not had recent blood work.         Past Medical History  Diagnosis Date  . Wolff-Parkinson-White (WPW) syndrome   . Hypertension   . Hyperlipidemia   . IBS (irritable bowel syndrome)     controlled with med  . Kidney stones   . PONV (postoperative nausea and vomiting)     " woke up to soon"  . Cough     hx. dry cough chronic  . Depression   . Disorder of tear duct system     frequent tearing of eyes  . Osteoarthritis     arthritis -joints-limited ROM shoulders more on right  . Sleep apnea     cpap use settings" 5-15 automatic"    Past Surgical History  Procedure Laterality Date  . Tear duct surgery    . Cervical spine surgery      cervical fusion with hardware retained  . Total abdominal hysterectomy    . Total hip arthroplasty      x 3(rt x2, lt x1)  . Ablation surgery      1993 [facility] (Dr. Hulen Skains)  . Eyelid carcinoma excision    . Joint replacement    . Cervical spine surgery    . Total shoulder arthroplasty Left 07/15/2012    Procedure: LEFT TOTAL SHOULDER ARTHROPLASTY;  Surgeon: Augustin Schooling, MD;  Location: [facility];  Service: Orthopedics;  Laterality: Left;  . Colon surgery      "colon detached from abdomen"  . Cardiac electrophysiology study and ablation      '93 for WPW syndrome  .  Total knee arthroplasty Right 08/08/2013    Procedure: RIGHT TOTAL KNEE ARTHROPLASTY WITH SAPHENOUS NERVE SCAR EXCISION;  Surgeon: Mauri Pole, MD;  Location: [facility];  Service: Orthopedics;  Laterality: Right;  . Ureteroscopy    . Cystoscopy/retrograde/ureteroscopy Left 12/18/2013    Procedure: CYSTOSCOPY/RETROGRADE/LEFT STENT;  Surgeon: Malka So, MD;  Location: [facility];  Service: Urology;  Laterality: Left;  . Esophagogastroduodenoscopy (egd) with propofol N/A 05/10/2014    Procedure: ESOPHAGOGASTRODUODENOSCOPY (EGD) WITH PROPOFOL;  Surgeon: Juanita Craver, MD;  [facility];  Service: Endoscopy;  Laterality: N/A;  . Colonoscopy with propofol N/A 05/10/2014    Procedure: COLONOSCOPY WITH PROPOFOL;  Surgeon: Juanita Craver, MD;  [facility];  Service: Endoscopy;  Laterality: N/A;    Current Outpatient Prescriptions  Medication Sig Dispense Refill  . amLODipine (NORVASC) 5 MG tablet Take 1 tablet (5 mg total) by mouth daily. 30 tablet 11  . carvedilol (COREG) 25 MG tablet TAKE ONE TABLET BY MOUTH TWICE DAILY 60 tablet 0  . DULoxetine (CYMBALTA) 60 MG capsule Take 1 capsule (60 mg total) by mouth every morning. 30 capsule 5  . esomeprazole (NEXIUM) 20 MG capsule Take 20 mg by  mouth daily as needed (indigestion.).    Marland Kitchen losartan-hydrochlorothiazide (HYZAAR) 100-12.5 MG per tablet Take 1 tablet by mouth every morning. 30 tablet 3  . lubiprostone (AMITIZA) 24 MCG capsule Take 1 capsule (24 mcg total) by mouth 2 (two) times daily with a meal. 60 capsule 11  . Multiple Vitamin (MULTIVITAMIN) tablet Take 1 tablet by mouth every morning.     . promethazine (PHENERGAN) 25 MG tablet Take 1 tab po at bedtime and then q 6 hrs as needed for nausea vomiting 15 tablet 0  . rOPINIRole (REQUIP) 1 MG tablet Take 1 tablet (1 mg total) by mouth at bedtime. 30 tablet 11   No current facility-administered medications for this visit.    Allergies  Allergen Reactions  . Codeine Other (See  Comments) and Nausea And Vomiting    vomiting  . Hydrocodone Nausea Only    Review of Systems negative except from HPI and PMH  Physical Exam BP 130/80 mmHg  Pulse 58  Ht 5\' 5"  (1.651 m)  Wt 247 lb 6.4 oz (112.22 kg)  BMI 41.17 kg/m2 Well developed and well nourished in no acute distress HENT normal E scleral and icterus clear Neck Supple JVP flat; carotids brisk and full Clear to ausculation  Regular rate and rhythm, no murmurs gallops or rub Soft with active bowel sounds No clubbing cyanosis  Edema Alert and oriented, grossly normal motor and sensory function Skin Warm and Dry superficial bruising   ECG demonstrates sinus rhythm at 62 Intervals 13/09/39 axis leftward at -6 RSR prime  Assessment and  Plan  Hypertension  Palpitations  Sleep apnea\  Preoperative consultation  Bruising   No improvement with CPAP.  Blood pressures improved  No significant symptoms of chest pain. She is exercising well. Surgical risks should be well within  acceptable range she will need CBC evaluation preoperatively. This is important context of her bruising also.

## 2014-06-14 NOTE — Patient Instructions (Signed)
Medication Instructions:  Your physician recommends that you continue on your current medications as directed. Please refer to the Current Medication list given to you today.  Labwork: None ordered  Testing/Procedures: None ordered  Follow-Up: Your physician wants you to follow-up in: 1 year with Dr. Caryl Comes.  You will receive a reminder letter in the mail two months in advance. If you don't receive a letter, please call our office to schedule the follow-up appointment.   Thank you for choosing [facility]!!

## 2014-06-15 ENCOUNTER — Telehealth: Payer: Self-pay | Admitting: *Deleted

## 2014-06-15 NOTE — Telephone Encounter (Signed)
Faxed cardiac clearance to [facility] for: right knee, tka-medial & lateral w/wo patella resurfacing.

## 2014-06-26 ENCOUNTER — Encounter (HOSPITAL_COMMUNITY)
Admission: RE | Admit: 2014-06-26 | Discharge: 2014-06-26 | Disposition: A | Discharge status: Home / Self Care | Payer: BC Managed Care – PPO | Source: Ambulatory Visit | Attending: Orthopedic Surgery | Admitting: Orthopedic Surgery

## 2014-06-26 ENCOUNTER — Encounter (HOSPITAL_COMMUNITY): Payer: Self-pay

## 2014-06-26 HISTORY — DX: Personal history of urinary calculi: Z87.442

## 2014-06-26 HISTORY — DX: Basal cell carcinoma of skin, unspecified: C44.91

## 2014-06-26 HISTORY — DX: Anxiety disorder, unspecified: F41.9

## 2014-06-26 HISTORY — DX: Personal history of other infectious and parasitic diseases: Z86.19

## 2014-06-26 LAB — BASIC METABOLIC PANEL
Anion gap: 13 (ref 5–15)
BUN: 14 mg/dL (ref 6–20)
CO2: 24 mmol/L (ref 22–32)
Calcium: 9.3 mg/dL (ref 8.9–10.3)
Chloride: 101 mmol/L (ref 101–111)
Creatinine, Ser: 0.88 mg/dL (ref 0.44–1.00)
GFR calc Af Amer: >60 mL/min (ref >60)
GFR calc non Af Amer: >60 mL/min (ref >60)
Glucose, Bld: 100 mg/dL — ABNORMAL HIGH (ref 65–99)
Potassium: 3 mmol/L — ABNORMAL LOW (ref 3.5–5.1)
Sodium: 138 mmol/L (ref 135–145)

## 2014-06-26 LAB — SURGICAL PCR SCREEN
MRSA, PCR: NEGATIVE
Staphylococcus aureus: NEGATIVE

## 2014-06-26 LAB — CBC
HCT: 37.4 % (ref 36.0–46.0)
Hemoglobin: 12.5 g/dL (ref 12.0–15.0)
MCH: 30.3 pg (ref 26.0–34.0)
MCHC: 33.4 g/dL (ref 30.0–36.0)
MCV: 90.8 fL (ref 78.0–100.0)
Platelets: 297 10*3/uL (ref 150–400)
RBC: 4.12 MIL/uL (ref 3.87–5.11)
RDW: 13.2 % (ref 11.5–15.5)
WBC: 5.8 10*3/uL (ref 4.0–10.5)

## 2014-06-26 NOTE — Pre-Procedure Instructions (Signed)
Connie Haynes  06/26/2014   Your procedure is scheduled on:  May 20  Report to [facility] Admitting at 08:30 AM.  Call this number if you have problems the morning of surgery: [phone]   Remember:   Do not eat food or drink liquids after midnight.   Take these medicines the morning of surgery with A SIP OF WATER: Amlodipine, Carvedilol, Cymbalta, Nexium,    STOP Multiple Vitamin, Cranberry, Vitamin D today   STOP/ Do not take Aspirin, Aleve, Naproxen, Advil, Ibuprofen, Motrin, Vitamins, Herbs, or Supplements starting today   Do not wear jewelry, make-up or nail polish.  Do not wear lotions, powders, or perfumes. You may wear deodorant.  Do not shave 48 hours prior to surgery. Men may shave face and neck.  Do not bring valuables to the hospital.  [facility] is not responsible for any belongings or valuables.               Contacts, dentures or bridgework may not be worn into surgery.  Leave suitcase in the car. After surgery it may be brought to your room.  For patients admitted to the hospital, discharge time is determined by your treatment team.               Special Instructions: [facility] - Preparing for Surgery  Before surgery, you can play an important role.  Because skin is not sterile, your skin needs to be as free of germs as possible.  You can reduce the number of germs on you skin by washing with CHG (chlorahexidine gluconate) soap before surgery.  CHG is an antiseptic cleaner which kills germs and bonds with the skin to continue killing germs even after washing.  Please DO NOT use if you have an allergy to CHG or antibacterial soaps.  If your skin becomes reddened/irritated stop using the CHG and inform your nurse when you arrive at Short Stay.  Do not shave (including legs and underarms) for at least 48 hours prior to the first CHG shower.  You may shave your face.  Please follow these instructions carefully:   1.  Shower with CHG Soap the night before  surgery and the morning of Surgery.  2.  If you choose to wash your hair, wash your hair first as usual with your normal shampoo.  3.  After you shampoo, rinse your hair and body thoroughly to remove the shampoo.  4.  Use CHG as you would any other liquid soap.  You can apply CHG directly to the skin and wash gently with scrungie or a clean washcloth.  5.  Apply the CHG Soap to your body ONLY FROM THE NECK DOWN.  Do not use on open wounds or open sores.  Avoid contact with your eyes, ears, mouth and genitals (private parts).  Wash genitals (private parts) with your normal soap.  6.  Wash thoroughly, paying special attention to the area where your surgery will be performed.  7.  Thoroughly rinse your body with warm water from the neck down.  8.  DO NOT shower/wash with your normal soap after using and rinsing off the CHG Soap.  9.  Pat yourself dry with a clean towel.            10.  Wear clean pajamas.            11.  Place clean sheets on your bed the night of your first shower and do not sleep with pets.  Day of Surgery  Do not apply any lotions the morning of surgery.  Please wear clean clothes to the hospital/surgery center.     Please read over the following fact sheets that you were given: Pain Booklet, Coughing and Deep Breathing and Surgical Site Infection Prevention

## 2014-06-26 NOTE — Progress Notes (Addendum)
Patient denies CP/ Shob. Cardiologist is Dr. Caryl Comes. Last OV noted in Epic. Report stress test 09/2013 in Epic. PCP Dr. Elza Rafter with [facility]. Reports home sleep study 01/2014. Home sleep study results printed out and placed on chart.

## 2014-06-26 NOTE — H&P (Signed)
Connie Haynes is an 59 y.o. female.    Chief Complaint: right shoulder pain  HPI: Pt is a 59 y.o. female complaining of right shoulder pain for multiple years. Pain had continually increased since the beginning. X-rays in the clinic show end-stage arthritic changes of the right shoulder. Pt has tried various conservative treatments which have failed to alleviate their symptoms, including injections and therapy. Various options are discussed with the patient. Risks, benefits and expectations were discussed with the patient. Patient understand the risks, benefits and expectations and wishes to proceed with surgery.   PCP:  Ival Bible, MD  D/C Plans: Home  PMH: Past Medical History  Diagnosis Date  . Wolff-Parkinson-White (WPW) syndrome   . Hypertension   . Hyperlipidemia   . IBS (irritable bowel syndrome)     controlled with med  . Kidney stones   . PONV (postoperative nausea and vomiting)     " woke up to soon"  . Cough     hx. dry cough chronic  . Depression   . Disorder of tear duct system     frequent tearing of eyes  . Osteoarthritis     arthritis -joints-limited ROM shoulders more on right  . Sleep apnea     cpap use settings" 5-15 automatic"    PSH: Past Surgical History  Procedure Laterality Date  . Tear duct surgery    . Cervical spine surgery      cervical fusion with hardware retained  . Total abdominal hysterectomy    . Total hip arthroplasty      x 3(rt x2, lt x1)  . Ablation surgery      1993 [facility] (Dr. Hulen Skains)  . Eyelid carcinoma excision    . Joint replacement    . Cervical spine surgery    . Total shoulder arthroplasty Left 07/15/2012    Procedure: LEFT TOTAL SHOULDER ARTHROPLASTY;  Surgeon: Augustin Schooling, MD;  Location: [facility];  Service: Orthopedics;  Laterality: Left;  . Colon surgery      "colon detached from abdomen"  . Cardiac electrophysiology study and ablation      '93 for WPW syndrome  . Total knee arthroplasty Right 08/08/2013   Procedure: RIGHT TOTAL KNEE ARTHROPLASTY WITH SAPHENOUS NERVE SCAR EXCISION;  Surgeon: Mauri Pole, MD;  [facility];  Service: Orthopedics;  Laterality: Right;  . Ureteroscopy    . Cystoscopy/retrograde/ureteroscopy Left 12/18/2013    Procedure: CYSTOSCOPY/RETROGRADE/LEFT STENT;  Surgeon: Malka So, MD;  [facility];  Service: Urology;  Laterality: Left;  . Esophagogastroduodenoscopy (egd) with propofol N/A 05/10/2014    Procedure: ESOPHAGOGASTRODUODENOSCOPY (EGD) WITH PROPOFOL;  Surgeon: Juanita Craver, MD;  [facility];  Service: Endoscopy;  Laterality: N/A;  . Colonoscopy with propofol N/A 05/10/2014    Procedure: COLONOSCOPY WITH PROPOFOL;  Surgeon: Juanita Craver, MD;  [facility];  Service: Endoscopy;  Laterality: N/A;    Social History:  reports that she has never smoked. She has never used smokeless tobacco. She reports that she does not drink alcohol or use illicit drugs.  Allergies:  Allergies  Allergen Reactions  . Codeine Nausea And Vomiting  . Hydrocodone Nausea Only    Medications: No current facility-administered medications for this encounter.   Current Outpatient Prescriptions  Medication Sig Dispense Refill  . amLODipine (NORVASC) 5 MG tablet Take 1 tablet (5 mg total) by mouth daily. 30 tablet 11  . amoxicillin (AMOXIL) 500 MG capsule Take 2,000 mg by mouth as needed (dental procedure).     Marland Kitchen  carvedilol (COREG) 25 MG tablet Take 1 tablet (25 mg total) by mouth 2 (two) times daily. (Patient taking differently: Take 12.5 mg by mouth 2 (two) times daily. ) 60 tablet 11  . cholecalciferol (VITAMIN D) 1000 UNITS tablet Take 1,000 Units by mouth daily.    . clonazePAM (KLONOPIN) 2 MG tablet Take 2 mg by mouth as needed (MRI).     Marland Kitchen CRANBERRY PO Take 1 tablet by mouth daily.    . DULoxetine (CYMBALTA) 60 MG capsule Take 1 capsule (60 mg total) by mouth every morning. 30 capsule 5  . esomeprazole (NEXIUM) 20 MG capsule Take 20 mg by mouth daily  as needed (indigestion.).    Marland Kitchen losartan-hydrochlorothiazide (HYZAAR) 100-12.5 MG per tablet Take 1 tablet by mouth every morning. 30 tablet 3  . lubiprostone (AMITIZA) 24 MCG capsule Take 1 capsule (24 mcg total) by mouth 2 (two) times daily with a meal. 60 capsule 11  . Multiple Vitamin (MULTIVITAMIN) tablet Take 1 tablet by mouth every morning.     . promethazine (PHENERGAN) 25 MG tablet Take 1 tab po at bedtime and then q 6 hrs as needed for nausea vomiting (Patient taking differently: Take 50 mg by mouth at bedtime. 25 mg with Requip and 25 mg with Percocet) 15 tablet 0  . rOPINIRole (REQUIP) 1 MG tablet Take 1 tablet (1 mg total) by mouth at bedtime. 30 tablet 11  . oxyCODONE-acetaminophen (PERCOCET/ROXICET) 5-325 MG per tablet Take 1 tablet by mouth at bedtime as needed (pain).       No results found for this or any previous visit (from the past 48 hour(s)). No results found.  ROS: Pain with rom of the right upper extremity  Physical Exam:  Alert and oriented 59 y.o. female in no acute distress Cranial nerves 2-12 intact Cervical spine: full rom with no tenderness, nv intact distally Chest: active breath sounds bilaterally, no wheeze rhonchi or rales Heart: regular rate and rhythm, no murmur Abd: non tender non distended with active bowel sounds Hip is stable with rom  Right shoulder moderate to severe pain with rom nv intact distally No rashes or edema  Assessment/Plan Assessment: right shoulder end stage osteoarthritis  Plan: Patient will undergo a right total vs reverse total shoulder arthroplasty by Dr. Veverly Fells at [facility]. Risks benefits and expectations were discussed with the patient. Patient understand risks, benefits and expectations and wishes to proceed.

## 2014-06-27 ENCOUNTER — Encounter (HOSPITAL_COMMUNITY): Payer: Self-pay

## 2014-06-27 NOTE — Progress Notes (Signed)
Anesthesia Chart Review:  Patient is a 59 year old female scheduled for right shoulder total versus reverse arthroplasty on 06/29/14 by Dr. Veverly Fells.  History includes non-smoker, WPW s/p ablation at [place] (Dr. Hulen Skains) in 1993, HTN, HLD, IBS, OA, post-operative N/V, cyclical cough without true asthma and felt related to post-nasal drip and GERD (Pulmonologist Dr. Asencion Noble), OSA with CPAP (Dr. Gwenette Greet), anxiety, skin cancer, s/p bilateral THA, c-spine surgery, colon surgery, right TKA '15, left total shoulder '14, nephrolithiasis s/p left ureteral stent '15. BMI is consistent with morbid obesity. PCP Dr. Ival Bible [facility]) medically cleared patient for this procedure. Cardiologist is Dr. Caryl Comes, last visit 06/14/14. He felt "Surgical risks should be well within acceptable range."   06/14/14 EKG: SB at 58 bpm, septal infarct (age undetermined).  10/04/13 Nuclear stress test: Overall Impression: Low risk stress nuclear study . There is mild attenuation of the inferior wall that is most likely due to uptake in the adjacent structures. LV Ejection Fraction: 63%. LV Wall Motion: NL LV Function; NL Wall Motion.  07/28/13 CXR: There is no active cardiopulmonary disease.  Preoperatively labs noted.   If no acute changes then I anticipate that she can proceed as planned.  George [facility] Short Stay Center/Anesthesiology Phone [phone] 06/27/2014 11:04 AM

## 2014-06-28 NOTE — Progress Notes (Signed)
Pt. Informed of surgery time change.  Patient stated she was already aware and was able to teach back that she will arrive at 05:30 DOS.

## 2014-06-29 ENCOUNTER — Inpatient Hospital Stay (HOSPITAL_COMMUNITY): Payer: BC Managed Care – PPO | Admitting: Anesthesiology

## 2014-06-29 ENCOUNTER — Inpatient Hospital Stay (HOSPITAL_COMMUNITY): Payer: BC Managed Care – PPO | Admitting: Vascular Surgery

## 2014-06-29 ENCOUNTER — Encounter (HOSPITAL_COMMUNITY): Payer: Self-pay | Admitting: *Deleted

## 2014-06-29 ENCOUNTER — Encounter (HOSPITAL_COMMUNITY)
Admission: RE | Disposition: A | Discharge status: Home / Self Care | Payer: Self-pay | Source: Ambulatory Visit | Attending: Orthopedic Surgery

## 2014-06-29 ENCOUNTER — Inpatient Hospital Stay (HOSPITAL_COMMUNITY)
Admission: RE | Admit: 2014-06-29 | Discharge: 2014-07-01 | DRG: 483 | Disposition: A | Discharge status: Home / Self Care | Payer: BC Managed Care – PPO | Source: Ambulatory Visit | Attending: Orthopedic Surgery | Admitting: Orthopedic Surgery

## 2014-06-29 ENCOUNTER — Inpatient Hospital Stay (HOSPITAL_COMMUNITY): Payer: BC Managed Care – PPO

## 2014-06-29 DIAGNOSIS — Z96651 Presence of right artificial knee joint: Secondary | ICD-10-CM | POA: Diagnosis present

## 2014-06-29 DIAGNOSIS — F329 Major depressive disorder, single episode, unspecified: Secondary | ICD-10-CM | POA: Diagnosis present

## 2014-06-29 DIAGNOSIS — M19011 Primary osteoarthritis, right shoulder: Principal | ICD-10-CM | POA: Diagnosis present

## 2014-06-29 DIAGNOSIS — Z96643 Presence of artificial hip joint, bilateral: Secondary | ICD-10-CM | POA: Diagnosis present

## 2014-06-29 DIAGNOSIS — Z885 Allergy status to narcotic agent status: Secondary | ICD-10-CM | POA: Diagnosis not present

## 2014-06-29 DIAGNOSIS — Z96619 Presence of unspecified artificial shoulder joint: Secondary | ICD-10-CM

## 2014-06-29 DIAGNOSIS — E785 Hyperlipidemia, unspecified: Secondary | ICD-10-CM | POA: Diagnosis present

## 2014-06-29 DIAGNOSIS — Z79899 Other long term (current) drug therapy: Secondary | ICD-10-CM

## 2014-06-29 DIAGNOSIS — I10 Essential (primary) hypertension: Secondary | ICD-10-CM | POA: Diagnosis present

## 2014-06-29 DIAGNOSIS — Z96612 Presence of left artificial shoulder joint: Secondary | ICD-10-CM | POA: Diagnosis present

## 2014-06-29 DIAGNOSIS — G4730 Sleep apnea, unspecified: Secondary | ICD-10-CM | POA: Diagnosis present

## 2014-06-29 DIAGNOSIS — Z6841 Body Mass Index (BMI) 40.0 and over, adult: Secondary | ICD-10-CM

## 2014-06-29 DIAGNOSIS — M25511 Pain in right shoulder: Secondary | ICD-10-CM | POA: Diagnosis present

## 2014-06-29 DIAGNOSIS — Z96611 Presence of right artificial shoulder joint: Secondary | ICD-10-CM

## 2014-06-29 DIAGNOSIS — I456 Pre-excitation syndrome: Secondary | ICD-10-CM | POA: Diagnosis present

## 2014-06-29 DIAGNOSIS — K589 Irritable bowel syndrome without diarrhea: Secondary | ICD-10-CM | POA: Diagnosis present

## 2014-06-29 DIAGNOSIS — Z79891 Long term (current) use of opiate analgesic: Secondary | ICD-10-CM | POA: Diagnosis not present

## 2014-06-29 HISTORY — PX: REVERSE SHOULDER ARTHROPLASTY: SHX5054

## 2014-06-29 SURGERY — ARTHROPLASTY, SHOULDER, TOTAL, REVERSE
Anesthesia: Regional | Site: Shoulder | Laterality: Right

## 2014-06-29 MED ORDER — PROMETHAZINE HCL 25 MG PO TABS
25.0000 mg | ORAL_TABLET | Freq: Four times a day (QID) | ORAL | Status: DC | PRN
  Administered 2014-06-29 – 2014-06-30 (×4): 25 mg via ORAL
  Filled 2014-06-29 (×4): qty 1

## 2014-06-29 MED ORDER — AMOXICILLIN 500 MG PO CAPS: 2000.0000 mg | ORAL_CAPSULE | ORAL | Status: DC | PRN

## 2014-06-29 MED ORDER — HYDROMORPHONE HCL 1 MG/ML IJ SOLN: 1.0000 mg | INTRAMUSCULAR | Status: DC | PRN

## 2014-06-29 MED ORDER — CEFAZOLIN SODIUM-DEXTROSE 2-3 GM-% IV SOLR
INTRAVENOUS | Status: AC
  Filled 2014-06-29: qty 50

## 2014-06-29 MED ORDER — BUPIVACAINE-EPINEPHRINE (PF) 0.25% -1:200000 IJ SOLN
INTRAMUSCULAR | Status: AC
  Filled 2014-06-29: qty 30

## 2014-06-29 MED ORDER — MIDAZOLAM HCL 5 MG/5ML IJ SOLN
INTRAMUSCULAR | Status: DC | PRN
  Administered 2014-06-29: 2 mg via INTRAVENOUS

## 2014-06-29 MED ORDER — DEXAMETHASONE SODIUM PHOSPHATE 4 MG/ML IJ SOLN
INTRAMUSCULAR | Status: DC | PRN
  Administered 2014-06-29: 4 mg via INTRAVENOUS

## 2014-06-29 MED ORDER — SODIUM CHLORIDE 0.9 % IR SOLN
Status: DC | PRN
  Administered 2014-06-29: 1000 mL

## 2014-06-29 MED ORDER — DULOXETINE HCL 60 MG PO CPEP
60.0000 mg | ORAL_CAPSULE | Freq: Every morning | ORAL | Status: DC
  Administered 2014-06-30 – 2014-07-01 (×2): 60 mg via ORAL
  Filled 2014-06-29 (×2): qty 1

## 2014-06-29 MED ORDER — CEFAZOLIN SODIUM-DEXTROSE 2-3 GM-% IV SOLR
2.0000 g | Freq: Four times a day (QID) | INTRAVENOUS | Status: AC
  Administered 2014-06-29 – 2014-06-30 (×3): 2 g via INTRAVENOUS
  Filled 2014-06-29 (×3): qty 50

## 2014-06-29 MED ORDER — SUCCINYLCHOLINE CHLORIDE 20 MG/ML IJ SOLN
INTRAMUSCULAR | Status: DC | PRN
  Administered 2014-06-29: 100 mg via INTRAVENOUS

## 2014-06-29 MED ORDER — CLONAZEPAM 2 MG PO TABS: 2.0000 mg | ORAL_TABLET | ORAL | Status: DC | PRN

## 2014-06-29 MED ORDER — ADULT MULTIVITAMIN W/MINERALS CH
1.0000 | ORAL_TABLET | Freq: Every morning | ORAL | Status: DC
  Administered 2014-06-30 – 2014-07-01 (×2): 1 via ORAL
  Filled 2014-06-29 (×4): qty 1

## 2014-06-29 MED ORDER — ARTIFICIAL TEARS OP OINT
TOPICAL_OINTMENT | OPHTHALMIC | Status: DC | PRN
  Administered 2014-06-29: 1 via OPHTHALMIC

## 2014-06-29 MED ORDER — ONDANSETRON HCL 4 MG/2ML IJ SOLN: 4.0000 mg | Freq: Four times a day (QID) | INTRAMUSCULAR | Status: DC | PRN

## 2014-06-29 MED ORDER — METOCLOPRAMIDE HCL 5 MG PO TABS: 5.0000 mg | ORAL_TABLET | Freq: Three times a day (TID) | ORAL | Status: DC | PRN

## 2014-06-29 MED ORDER — FENTANYL CITRATE (PF) 100 MCG/2ML IJ SOLN
INTRAMUSCULAR | Status: DC | PRN
  Administered 2014-06-29 (×3): 12.5 ug via INTRAVENOUS
  Administered 2014-06-29: 50 ug via INTRAVENOUS
  Administered 2014-06-29: 12.5 ug via INTRAVENOUS

## 2014-06-29 MED ORDER — OXYCODONE-ACETAMINOPHEN 5-325 MG PO TABS: 1.0000 | ORAL_TABLET | Freq: Every evening | ORAL | Status: DC | PRN

## 2014-06-29 MED ORDER — KETOROLAC TROMETHAMINE 30 MG/ML IJ SOLN
INTRAMUSCULAR | Status: AC
  Filled 2014-06-29: qty 1

## 2014-06-29 MED ORDER — PROMETHAZINE HCL 25 MG/ML IJ SOLN
6.2500 mg | INTRAMUSCULAR | Status: DC | PRN
Start: 2014-06-29 — End: 2014-06-29

## 2014-06-29 MED ORDER — HYDROMORPHONE HCL 1 MG/ML IJ SOLN
INTRAMUSCULAR | Status: AC
  Filled 2014-06-29: qty 1

## 2014-06-29 MED ORDER — CARVEDILOL 25 MG PO TABS
25.0000 mg | ORAL_TABLET | Freq: Two times a day (BID) | ORAL | Status: DC
  Administered 2014-06-30 – 2014-07-01 (×3): 25 mg via ORAL
  Filled 2014-06-29 (×3): qty 1

## 2014-06-29 MED ORDER — MIDAZOLAM HCL 2 MG/2ML IJ SOLN
INTRAMUSCULAR | Status: AC
  Filled 2014-06-29: qty 2

## 2014-06-29 MED ORDER — PHENYLEPHRINE HCL 10 MG/ML IJ SOLN
10.0000 mg | INTRAVENOUS | Status: DC | PRN
  Administered 2014-06-29: 20 ug/min via INTRAVENOUS

## 2014-06-29 MED ORDER — SODIUM CHLORIDE 0.9 % IV SOLN
INTRAVENOUS | Status: DC
  Administered 2014-06-29: 15:00:00 via INTRAVENOUS

## 2014-06-29 MED ORDER — VITAMIN D3 25 MCG (1000 UNIT) PO TABS
1000.0000 [IU] | ORAL_TABLET | Freq: Every day | ORAL | Status: DC
  Administered 2014-06-30 – 2014-07-01 (×2): 1000 [IU] via ORAL
  Filled 2014-06-29 (×5): qty 1

## 2014-06-29 MED ORDER — BUPIVACAINE-EPINEPHRINE 0.25% -1:200000 IJ SOLN
INTRAMUSCULAR | Status: DC | PRN
  Administered 2014-06-29: 10 mL

## 2014-06-29 MED ORDER — PROMETHAZINE HCL 25 MG PO TABS
25.0000 mg | ORAL_TABLET | Freq: Every day | ORAL | Status: DC
  Administered 2014-06-29: 25 mg via ORAL
  Filled 2014-06-29: qty 1

## 2014-06-29 MED ORDER — OXYCODONE HCL 5 MG PO TABS
5.0000 mg | ORAL_TABLET | ORAL | Status: DC | PRN
  Administered 2014-06-29: 10 mg via ORAL
  Administered 2014-06-29: 5 mg via ORAL
  Administered 2014-06-30 – 2014-07-01 (×10): 10 mg via ORAL
  Filled 2014-06-29 (×12): qty 2

## 2014-06-29 MED ORDER — LACTATED RINGERS IV SOLN
INTRAVENOUS | Status: DC | PRN
  Administered 2014-06-29 (×2): via INTRAVENOUS

## 2014-06-29 MED ORDER — KETOROLAC TROMETHAMINE 30 MG/ML IJ SOLN
30.0000 mg | Freq: Once | INTRAMUSCULAR | Status: AC | PRN
  Administered 2014-06-29: 30 mg via INTRAVENOUS

## 2014-06-29 MED ORDER — LIDOCAINE HCL (CARDIAC) 20 MG/ML IV SOLN
INTRAVENOUS | Status: DC | PRN
  Administered 2014-06-29: 50 mg via INTRAVENOUS

## 2014-06-29 MED ORDER — ACETAMINOPHEN 650 MG RE SUPP: 650.0000 mg | Freq: Four times a day (QID) | RECTAL | Status: DC | PRN

## 2014-06-29 MED ORDER — ONDANSETRON HCL 4 MG/2ML IJ SOLN
INTRAMUSCULAR | Status: DC | PRN
Start: 2014-06-29 — End: 2014-06-29
  Administered 2014-06-29: 4 mg via INTRAVENOUS

## 2014-06-29 MED ORDER — OXYCODONE-ACETAMINOPHEN 5-325 MG PO TABS: 1.0000 | ORAL_TABLET | ORAL | Status: DC | PRN

## 2014-06-29 MED ORDER — LOSARTAN POTASSIUM-HCTZ 100-12.5 MG PO TABS: 1.0000 | ORAL_TABLET | Freq: Every morning | ORAL | Status: DC

## 2014-06-29 MED ORDER — BUPIVACAINE-EPINEPHRINE (PF) 0.5% -1:200000 IJ SOLN
INTRAMUSCULAR | Status: DC | PRN
  Administered 2014-06-29: 20 mL via PERINEURAL

## 2014-06-29 MED ORDER — HYDROCHLOROTHIAZIDE 12.5 MG PO CAPS
12.5000 mg | ORAL_CAPSULE | Freq: Every day | ORAL | Status: DC
  Administered 2014-06-29 – 2014-07-01 (×3): 12.5 mg via ORAL
  Filled 2014-06-29 (×3): qty 1

## 2014-06-29 MED ORDER — PROPOFOL INFUSION 10 MG/ML OPTIME
INTRAVENOUS | Status: DC | PRN
  Administered 2014-06-29: 25 ug/kg/min via INTRAVENOUS

## 2014-06-29 MED ORDER — MEPERIDINE HCL 25 MG/ML IJ SOLN: 6.2500 mg | INTRAMUSCULAR | Status: DC | PRN

## 2014-06-29 MED ORDER — HYDROMORPHONE HCL 1 MG/ML IJ SOLN
0.2500 mg | INTRAMUSCULAR | Status: DC | PRN
  Administered 2014-06-29: 0.5 mg via INTRAVENOUS

## 2014-06-29 MED ORDER — METHOCARBAMOL 500 MG PO TABS: 500.0000 mg | ORAL_TABLET | Freq: Three times a day (TID) | ORAL | Status: DC | PRN

## 2014-06-29 MED ORDER — MENTHOL 3 MG MT LOZG: 1.0000 | LOZENGE | OROMUCOSAL | Status: DC | PRN

## 2014-06-29 MED ORDER — CEFAZOLIN SODIUM-DEXTROSE 2-3 GM-% IV SOLR
2.0000 g | INTRAVENOUS | Status: AC
  Administered 2014-06-29: 2 g via INTRAVENOUS

## 2014-06-29 MED ORDER — PROPOFOL 10 MG/ML IV BOLUS
INTRAVENOUS | Status: AC
  Filled 2014-06-29: qty 20

## 2014-06-29 MED ORDER — PROPOFOL 10 MG/ML IV BOLUS
INTRAVENOUS | Status: DC | PRN
  Administered 2014-06-29: 200 mg via INTRAVENOUS

## 2014-06-29 MED ORDER — ONDANSETRON HCL 4 MG PO TABS: 4.0000 mg | ORAL_TABLET | Freq: Four times a day (QID) | ORAL | Status: DC | PRN

## 2014-06-29 MED ORDER — PANTOPRAZOLE SODIUM 40 MG PO TBEC
80.0000 mg | DELAYED_RELEASE_TABLET | Freq: Every day | ORAL | Status: DC
  Administered 2014-06-30: 80 mg via ORAL
  Filled 2014-06-29: qty 2

## 2014-06-29 MED ORDER — FENTANYL CITRATE (PF) 250 MCG/5ML IJ SOLN
INTRAMUSCULAR | Status: AC
  Filled 2014-06-29: qty 5

## 2014-06-29 MED ORDER — METOCLOPRAMIDE HCL 5 MG/ML IJ SOLN: 5.0000 mg | Freq: Three times a day (TID) | INTRAMUSCULAR | Status: DC | PRN

## 2014-06-29 MED ORDER — LIDOCAINE HCL 4 % MT SOLN
OROMUCOSAL | Status: DC | PRN
  Administered 2014-06-29: 4 mL via TOPICAL

## 2014-06-29 MED ORDER — LUBIPROSTONE 24 MCG PO CAPS
24.0000 ug | ORAL_CAPSULE | Freq: Two times a day (BID) | ORAL | Status: DC
  Administered 2014-06-29 – 2014-07-01 (×4): 24 ug via ORAL
  Filled 2014-06-29 (×8): qty 1

## 2014-06-29 MED ORDER — PROMETHAZINE HCL 25 MG/ML IJ SOLN
12.5000 mg | INTRAMUSCULAR | Status: AC
  Filled 2014-06-29: qty 1

## 2014-06-29 MED ORDER — LOSARTAN POTASSIUM 50 MG PO TABS
100.0000 mg | ORAL_TABLET | Freq: Every day | ORAL | Status: DC
  Administered 2014-06-29 – 2014-07-01 (×3): 100 mg via ORAL
  Filled 2014-06-29 (×3): qty 2

## 2014-06-29 MED ORDER — PHENOL 1.4 % MT LIQD: 1.0000 | OROMUCOSAL | Status: DC | PRN

## 2014-06-29 MED ORDER — AMLODIPINE BESYLATE 5 MG PO TABS
5.0000 mg | ORAL_TABLET | Freq: Every day | ORAL | Status: DC
  Administered 2014-06-30 – 2014-07-01 (×2): 5 mg via ORAL
  Filled 2014-06-29 (×2): qty 1

## 2014-06-29 MED ORDER — METHOCARBAMOL 500 MG PO TABS
500.0000 mg | ORAL_TABLET | Freq: Four times a day (QID) | ORAL | Status: DC | PRN
  Administered 2014-06-29 – 2014-06-30 (×5): 500 mg via ORAL
  Filled 2014-06-29 (×5): qty 1

## 2014-06-29 MED ORDER — METHOCARBAMOL 1000 MG/10ML IJ SOLN: 500.0000 mg | Freq: Four times a day (QID) | INTRAVENOUS | Status: DC | PRN

## 2014-06-29 MED ORDER — CHLORHEXIDINE GLUCONATE 4 % EX LIQD
60.0000 mL | Freq: Once | CUTANEOUS | Status: DC
  Filled 2014-06-29: qty 60

## 2014-06-29 MED ORDER — ROPINIROLE HCL 1 MG PO TABS
1.0000 mg | ORAL_TABLET | Freq: Every day | ORAL | Status: DC
  Administered 2014-06-29 – 2014-06-30 (×2): 1 mg via ORAL
  Filled 2014-06-29 (×2): qty 1

## 2014-06-29 MED ORDER — THROMBIN 5000 UNITS EX SOLR
CUTANEOUS | Status: AC
  Filled 2014-06-29: qty 5000

## 2014-06-29 MED ORDER — ACETAMINOPHEN 325 MG PO TABS: 650.0000 mg | ORAL_TABLET | Freq: Four times a day (QID) | ORAL | Status: DC | PRN

## 2014-06-29 SURGICAL SUPPLY — 82 items
BIT DRILL 170X2.5X (BIT) IMPLANT
BIT DRILL 5/64X5 DISP (BIT) ×3 IMPLANT
BIT DRL 170X2.5X (BIT)
BLADE SAG 18X100X1.27 (BLADE) ×3 IMPLANT
BLADE SAW SAG 73X25 THK (BLADE)
BLADE SAW SGTL 73X25 THK (BLADE) IMPLANT
BUR SURG 4X8 MED (BURR) IMPLANT
BURR SURG 4X8 MED (BURR)
CAPT SHLDR REVTOTAL 1 ×3 IMPLANT
COVER SURGICAL LIGHT HANDLE (MISCELLANEOUS) ×3 IMPLANT
DRAPE IMP U-DRAPE 54X76 (DRAPES) ×6 IMPLANT
DRAPE INCISE IOBAN 66X45 STRL (DRAPES) IMPLANT
DRAPE ORTHO SPLIT 77X108 STRL (DRAPES) ×2
DRAPE SURG ORHT 6 SPLT 77X108 (DRAPES) ×4 IMPLANT
DRAPE U-SHAPE 47X51 STRL (DRAPES) ×3 IMPLANT
DRAPE X-RAY CASS 24X20 (DRAPES) IMPLANT
DRILL 2.5 (BIT)
DRILL BIT 5/64 (BIT) ×3 IMPLANT
DRSG ADAPTIC 3X8 NADH LF (GAUZE/BANDAGES/DRESSINGS) ×3 IMPLANT
DRSG PAD ABDOMINAL 8X10 ST (GAUZE/BANDAGES/DRESSINGS) ×3 IMPLANT
DURAPREP 26ML APPLICATOR (WOUND CARE) ×3 IMPLANT
ELECT BLADE 4.0 EZ CLEAN MEGAD (MISCELLANEOUS) ×3
ELECT NEEDLE TIP 2.8 STRL (NEEDLE) ×3 IMPLANT
ELECT REM PT RETURN 9FT ADLT (ELECTROSURGICAL) ×3
ELECTRODE BLDE 4.0 EZ CLN MEGD (MISCELLANEOUS) ×2 IMPLANT
ELECTRODE REM PT RTRN 9FT ADLT (ELECTROSURGICAL) ×2 IMPLANT
GAUZE SPONGE 4X4 12PLY STRL (GAUZE/BANDAGES/DRESSINGS) ×3 IMPLANT
GLOVE BIOGEL PI ORTHO PRO 7.5 (GLOVE) ×1
GLOVE BIOGEL PI ORTHO PRO SZ8 (GLOVE) ×1
GLOVE ORTHO TXT STRL SZ7.5 (GLOVE) ×3 IMPLANT
GLOVE PI ORTHO PRO STRL 7.5 (GLOVE) ×2 IMPLANT
GLOVE PI ORTHO PRO STRL SZ8 (GLOVE) ×2 IMPLANT
GLOVE SURG ORTHO 8.5 STRL (GLOVE) ×6 IMPLANT
GOWN STRL REUS W/ TWL LRG LVL3 (GOWN DISPOSABLE) ×2 IMPLANT
GOWN STRL REUS W/ TWL XL LVL3 (GOWN DISPOSABLE) ×4 IMPLANT
GOWN STRL REUS W/TWL LRG LVL3 (GOWN DISPOSABLE) ×1
GOWN STRL REUS W/TWL XL LVL3 (GOWN DISPOSABLE) ×2
HANDPIECE INTERPULSE COAX TIP (DISPOSABLE)
KIT BASIN OR (CUSTOM PROCEDURE TRAY) ×3 IMPLANT
KIT ROOM TURNOVER OR (KITS) ×3 IMPLANT
MANIFOLD NEPTUNE II (INSTRUMENTS) ×3 IMPLANT
NDL SUT 6 .5 CRC .975X.05 MAYO (NEEDLE) ×2 IMPLANT
NEEDLE 1/2 CIR MAYO (NEEDLE) IMPLANT
NEEDLE HYPO 25GX1X1/2 BEV (NEEDLE) ×3 IMPLANT
NEEDLE MAYO TAPER (NEEDLE) ×1
NS IRRIG 1000ML POUR BTL (IV SOLUTION) ×3 IMPLANT
PACK SHOULDER (CUSTOM PROCEDURE TRAY) ×3 IMPLANT
PACK UNIVERSAL I (CUSTOM PROCEDURE TRAY) ×3 IMPLANT
PAD ARMBOARD 7.5X6 YLW CONV (MISCELLANEOUS) ×6 IMPLANT
PIN GUIDE 1.2 (PIN) IMPLANT
PIN GUIDE GLENOPHERE 1.5MX300M (PIN) IMPLANT
PIN METAGLENE 2.5 (PIN) IMPLANT
SET HNDPC FAN SPRY TIP SCT (DISPOSABLE) IMPLANT
SLING ARM FOAM STRAP LRG (SOFTGOODS) ×3 IMPLANT
SLING ARM IMMOBILIZER LRG (SOFTGOODS) IMPLANT
SLING ARM IMMOBILIZER MED (SOFTGOODS) IMPLANT
SLING ARM LRG ADULT FOAM STRAP (SOFTGOODS) IMPLANT
SLING ARM MED ADULT FOAM STRAP (SOFTGOODS) IMPLANT
SMARTMIX MINI TOWER (MISCELLANEOUS)
SPONGE LAP 18X18 X RAY DECT (DISPOSABLE) ×3 IMPLANT
SPONGE LAP 4X18 X RAY DECT (DISPOSABLE) ×3 IMPLANT
SPONGE SURGIFOAM ABS GEL SZ50 (HEMOSTASIS) IMPLANT
STRIP CLOSURE SKIN 1/2X4 (GAUZE/BANDAGES/DRESSINGS) ×3 IMPLANT
SUCTION FRAZIER TIP 10 FR DISP (SUCTIONS) ×3 IMPLANT
SUT FIBERWIRE #2 38 T-5 BLUE (SUTURE) ×15
SUT MNCRL AB 4-0 PS2 18 (SUTURE) ×3 IMPLANT
SUT VIC AB 0 CT1 27 (SUTURE) ×1
SUT VIC AB 0 CT1 27XBRD ANBCTR (SUTURE) ×2 IMPLANT
SUT VIC AB 0 CT2 27 (SUTURE) ×3 IMPLANT
SUT VIC AB 2-0 CT1 27 (SUTURE) ×1
SUT VIC AB 2-0 CT1 TAPERPNT 27 (SUTURE) ×2 IMPLANT
SUT VICRYL 0 CT 1 36IN (SUTURE) ×3 IMPLANT
SUT VICRYL AB 2 0 TIES (SUTURE) IMPLANT
SUTURE FIBERWR #2 38 T-5 BLUE (SUTURE) ×10 IMPLANT
SYR CONTROL 10ML LL (SYRINGE) ×3 IMPLANT
TOWEL OR 17X24 6PK STRL BLUE (TOWEL DISPOSABLE) ×3 IMPLANT
TOWEL OR 17X26 10 PK STRL BLUE (TOWEL DISPOSABLE) ×3 IMPLANT
TOWER CARTRIDGE SMART MIX (DISPOSABLE) IMPLANT
TOWER SMARTMIX MINI (MISCELLANEOUS) IMPLANT
TRAY FOLEY CATH 16FRSI W/METER (SET/KITS/TRAYS/PACK) IMPLANT
WATER STERILE IRR 1000ML POUR (IV SOLUTION) ×3 IMPLANT
YANKAUER SUCT BULB TIP NO VENT (SUCTIONS) ×3 IMPLANT

## 2014-06-29 NOTE — Progress Notes (Signed)
Orthopedics Progress Note  Subjective: Patient reports block wearing off but pain manageable.  Objective:  Filed Vitals:   06/29/14 1258  BP: 148/78  Pulse: 70  Temp: 97.6 F (36.4 C)  Resp: 16    General: Awake and alert  Musculoskeletal: able to move the fingers and the hand well, not able to move the proximal arm due to block. Dressing intact Neurovascularly intact  Lab Results  Component Value Date   WBC 5.8 06/26/2014   HGB 12.5 06/26/2014   HCT 37.4 06/26/2014   MCV 90.8 06/26/2014   PLT 297 06/26/2014       Component Value Date/Time   NA 138 06/26/2014 1300   K 3.0* 06/26/2014 1300   CL 101 06/26/2014 1300   CO2 24 06/26/2014 1300   GLUCOSE 100* 06/26/2014 1300   BUN 14 06/26/2014 1300   CREATININE 0.88 06/26/2014 1300   CREATININE 0.82 05/23/2012 1601   CALCIUM 9.3 06/26/2014 1300   GFRNONAA >60 06/26/2014 1300   GFRAA >60 06/26/2014 1300    Lab Results  Component Value Date   INR 0.87 07/28/2013   INR 0.9 07/27/2007   INR 0.9 12/31/2006    Assessment/Plan: s/p Procedure(s): REVERSE TOTAL SHOULDER ARTHROPLASTY Limit weight bearing, OT tomorrow focused on gentle ADLs and positioning.  Very important to keep the arm up front and not let it get extended. Likely D/C Sunday  Doran Heater. Veverly Fells, MD 06/29/2014 6:18 PM

## 2014-06-29 NOTE — Anesthesia Procedure Notes (Addendum)
Procedure Name: Intubation Date/Time: 06/29/2014 7:48 AM Performed by: Suzy Bouchard Pre-anesthesia Checklist: Timeout performed, Patient identified, Emergency Drugs available, Suction available and Patient being monitored Patient Re-evaluated:Patient Re-evaluated prior to inductionOxygen Delivery Method: Circle system utilized Preoxygenation: Pre-oxygenation with 100% oxygen Intubation Type: IV induction Ventilation: Mask ventilation without difficulty Laryngoscope Size: Miller and 2 Grade View: Grade I Tube type: Oral Tube size: 7.0 mm Number of attempts: 1 Airway Equipment and Method: Stylet and LTA kit utilized Placement Confirmation: ETT inserted through vocal cords under direct vision,  breath sounds checked- equal and bilateral and positive ETCO2 Secured at: 21 cm Tube secured with: Tape Dental Injury: Teeth and Oropharynx as per pre-operative assessment    Anesthesia Regional Block:  Interscalene brachial plexus block  Pre-Anesthetic Checklist: ,, timeout performed, Correct Patient, Correct Site, Correct Laterality, Correct Procedure, Correct Position, site marked, Risks and benefits discussed,  Surgical consent,  Pre-op evaluation,  At surgeon's request and post-op pain management  Laterality: Right  Prep: chloraprep       Needles:  Injection technique: Single-shot  Needle Type: Stimiplex     Needle Length: 4cm 4 cm Needle Gauge: 21 and 21 G    Additional Needles:  Procedures: ultrasound guided (picture in chart) Interscalene brachial plexus block Narrative:  Injection made incrementally with aspirations every 5 mL.  Performed by: Personally  Anesthesiologist: Nolon Nations  Additional Notes: Patient tolerated the procedure well without complications

## 2014-06-29 NOTE — Transfer of Care (Signed)
Immediate Anesthesia Transfer of Care Note  Patient: Connie Haynes  Procedure(s) Performed: Procedure(s): REVERSE TOTAL SHOULDER ARTHROPLASTY (Right)  Patient Location: PACU  Anesthesia Type:GA combined with regional for post-op pain  Level of Consciousness: awake and alert   Airway & Oxygen Therapy: Patient Spontanous Breathing and Patient connected to nasal cannula oxygen  Post-op Assessment: Report given to RN and Post -op Vital signs reviewed and stable  Post vital signs: Reviewed and stable  Last Vitals:  Filed Vitals:   06/29/14 0551  BP: 134/59  Pulse: 61  Temp: 94.4 C    Complications: No apparent anesthesia complications

## 2014-06-29 NOTE — Anesthesia Preprocedure Evaluation (Signed)
Anesthesia Evaluation  Patient identified by MRN, date of birth, ID band Patient awake    Reviewed: Allergy & Precautions, NPO status , Patient's Chart, lab work & pertinent test results  History of Anesthesia Complications (+) PONV and history of anesthetic complications  Airway Mallampati: II  TM Distance: >3 FB Neck ROM: Full    Dental no notable dental hx. (+) Dental Advisory Given   Pulmonary sleep apnea and Continuous Positive Airway Pressure Ventilation ,  breath sounds clear to auscultation  Pulmonary exam normal       Cardiovascular hypertension, Pt. on medications and Pt. on home beta blockers Normal cardiovascular exam+ dysrhythmias Rhythm:Regular Rate:Normal  Hx of wpw s/p ablation   Neuro/Psych PSYCHIATRIC DISORDERS Anxiety Depression negative neurological ROS     GI/Hepatic negative GI ROS, Neg liver ROS,   Endo/Other  negative endocrine ROS  Renal/GU Renal disease  negative genitourinary   Musculoskeletal  (+) Arthritis -, Osteoarthritis,    Abdominal   Peds negative pediatric ROS (+)  Hematology negative hematology ROS (+)   Anesthesia Other Findings   Reproductive/Obstetrics negative OB ROS                             Anesthesia Physical  Anesthesia Plan  ASA: III  Anesthesia Plan: General and Regional   Post-op Pain Management: MAC Combined w/ Regional for Post-op pain   Induction: Intravenous  Airway Management Planned: Oral ETT  Additional Equipment: None  Intra-op Plan:   Post-operative Plan: Extubation in OR  Informed Consent: I have reviewed the patients History and Physical, chart, labs and discussed the procedure including the risks, benefits and alternatives for the proposed anesthesia with the patient or authorized representative who has indicated his/her understanding and acceptance.   Dental advisory given  Plan Discussed with:  CRNA  Anesthesia Plan Comments:         Anesthesia Quick Evaluation

## 2014-06-29 NOTE — Anesthesia Postprocedure Evaluation (Signed)
Anesthesia Post Note  Patient: Connie Haynes  Procedure(s) Performed: Procedure(s) (LRB): REVERSE TOTAL SHOULDER ARTHROPLASTY (Right)  Anesthesia type: General + interscalene block  Patient location: PACU  Post pain: Pain level controlled  Post assessment: Post-op Vital signs reviewed  Last Vitals: BP 138/99 mmHg  Pulse 61  Temp(Src) 36.2 C (Oral)  Resp 16  Ht 5\' 5"  (1.651 m)  Wt 246 lb (111.585 kg)  BMI 40.94 kg/m2  SpO2 96%  Post vital signs: Reviewed  Level of consciousness: sedated  Complications: No apparent anesthesia complications

## 2014-06-29 NOTE — Brief Op Note (Signed)
06/29/2014  10:44 AM  PATIENT:  Connie Haynes  59 y.o. female  PRE-OPERATIVE DIAGNOSIS:  RIGHT SHOULDER END-STAGE OA, RC INSUFFICIENCY  POST-OPERATIVE DIAGNOSIS:  RIGHT SHOULDER END-STAGE OA, RC INSUFFICIENCY  PROCEDURE:  Procedure(s): REVERSE TOTAL SHOULDER ARTHROPLASTY (Right) DePuy Delta Xtend  SURGEON:  Surgeon(s) and Role:    * Netta Cedars, MD - Primary  PHYSICIAN ASSISTANT:   ASSISTANTS: [name], PA-C   ANESTHESIA:   regional and general  EBL:  Total I/O In: 1000 [I.V.:1000] Out: 400 [Blood:400]  BLOOD ADMINISTERED:none  DRAINS: none   LOCAL MEDICATIONS USED:  MARCAINE     SPECIMEN:  No Specimen  DISPOSITION OF SPECIMEN:  N/A  COUNTS:  YES  TOURNIQUET:  * No tourniquets in log *  DICTATION: .Other Dictation: Dictation Number [id number]  PLAN OF CARE: Admit to inpatient   PATIENT DISPOSITION:  PACU - hemodynamically stable.   Delay start of Pharmacological VTE agent (>24hrs) due to surgical blood loss or risk of bleeding: not applicable

## 2014-06-29 NOTE — Discharge Instructions (Signed)
Ice to the shoulder at all times.  Keep the arm propped forward so you can see your elbow.  Ok to do gentle Activities of Daily Living.  No forceful use of the right arm.  Keep the incision clean and dry for one week, then ok to get wet in the shower.  Follow up in two weeks in the office  [phone]

## 2014-06-30 LAB — BASIC METABOLIC PANEL
Anion gap: 12 (ref 5–15)
BUN: 10 mg/dL (ref 6–20)
CO2: 23 mmol/L (ref 22–32)
Calcium: 8.4 mg/dL — ABNORMAL LOW (ref 8.9–10.3)
Chloride: 103 mmol/L (ref 101–111)
Creatinine, Ser: 0.85 mg/dL (ref 0.44–1.00)
GFR calc Af Amer: >60 mL/min (ref >60)
GFR calc non Af Amer: >60 mL/min (ref >60)
Glucose, Bld: 138 mg/dL — ABNORMAL HIGH (ref 65–99)
Potassium: 3.7 mmol/L (ref 3.5–5.1)
Sodium: 138 mmol/L (ref 135–145)

## 2014-06-30 LAB — HEMOGLOBIN AND HEMATOCRIT, BLOOD
HCT: 32.1 % — ABNORMAL LOW (ref 36.0–46.0)
Hemoglobin: 10.8 g/dL — ABNORMAL LOW (ref 12.0–15.0)

## 2014-06-30 NOTE — Op Note (Signed)
Connie Haynes, Connie Haynes                  ACCOUNT NO.:  [id number]  MEDICAL RECORD NO.:  [id number]  LOCATION:  5N25C                        FACILITY:  [facility]  PHYSICIAN:  Doran Heater. Veverly Fells, M.D. DATE OF BIRTH:  [birth year]  DATE OF PROCEDURE:  06/29/2014 DATE OF DISCHARGE:                              OPERATIVE REPORT   PREOPERATIVE DIAGNOSIS:  Right shoulder end-stage arthritis with rotator cuff insufficiency.  POSTOPERATIVE DIAGNOSIS:  Right shoulder end-stage arthritis with rotator cuff insufficiency.  PROCEDURE PERFORMED:  Right shoulder reverse total shoulder arthroplasty using DePuy Delta Xtend prosthesis.  ATTENDING SURGEON:  Doran Heater. Veverly Fells, MD  ASSISTANT:  Abbott Pao. Dixon, PA-C who has scrubbed during the entire procedure and necessary for satisfactory completion of surgery.  ANESTHESIA:  General anesthesia was used plus interscalene block.  ESTIMATED BLOOD LOSS:  Minimal.  FLUID REPLACEMENT:  1200 mL crystalloids.  INSTRUMENT COUNTS:  Correct.  COMPLICATIONS:  No complications.  ANTIBIOTICS:  Perioperative antibiotics were given.  INDICATIONS:  Patient is a 59 year old female who presents with history of right shoulder pain and dysfunction.  The patient had now years of pain, consistently getting worse despite injections, modification activity, and pain medications.  Patient also over the last at least 6 months to a year had declining function to the point where she is not able to return her hand up to behind her head without exquisite pain. The combination of severe wear in her shoulder based on x-ray and MRI scan and significant atrophy in her subscapularis and supraspinatus tendons had [place] concerned about her potential candidacy for a standard total shoulder arthroplasty.  Discussed with patient options including continued conservative management which she felt was just not acceptable due to the pain level versus treatment with surgical arthroplasty, either  reverse or standard total shoulder, she would like to proceed with surgery.  Our decision will be made at the time of surgery based on the quality of the rotator cuff tendon despite her having significant atrophy on the muscles on MRI.  Risks and benefits were discussed. Informed consent obtained.  DESCRIPTION OF PROCEDURE:  After an adequate level of anesthesia was achieved, the patient was positioned in modified beach chair position, right shoulder correctly identified and sterilely prepped and draped in usual manner.  Time-out was called.  We entered the shoulder using the standard deltopectoral approach starting at coracoid process extending down to the anterior humerus.  Dissection down through subcutaneous tissues.  We identified the cephalic vein took it laterally with the deltoid, pectoralis taken medially.  Conjoined tendon retracted medially.  Subscapularis released subperiosteally off the lesser tuberosity and tagged for repair at the end.  Subscap actually looked to be in decent shape.  The supraspinatus however was very thin and did not have great excursion.  We decided at that time that this would be a reverse type situation.  She had terrible synovitis in the shoulder, extreme thickening of the synovium and an inflammation.  There was advanced arthritis noted in the shoulder.  We went ahead and placed our shoulder in extension and externally rotated and was able to deliver the humeral head out of the wound.  The supraspinatus was sacrificed.  The infraspinatus was released and then tagged for repair.  We then entered the proximal humerus using a 6 mm reamer and then we reamed up to size 12 mm and then placed a 12 intramedullary guide for our head resection which we did a 10 degrees of retroversion with an oscillating saw.  We then did our metaphyseal preparation for a size 1 right.  Once we had that in place, we placed our trial implant, retracted the humerus posteriorly,  released the biceps and tenodesed it.  We then went ahead and did a 360-degree capsular removal, careful to protect axillary nerve inferiorly, made sure the subscap was released and balanced well.  We then went ahead and found our center point for our guide pin placed that and then reamed initially for the metaglene.  We then removed excess bone at the periphery, drilled our central PEG hole and then placed the bed again in glenoid position, got a 48 locked inferiorly, 30 locked in the base of the coracoid and 18 nonlocked posteriorly, could not get one anteriorly, she had a very small glenoid.  We then selected 38 standard glenosphere and impacted out and screwed it into position, nice and tight.  We checked the axillary nerve to make sure it was free and clear, which it was, reduced the shoulder to initially the 3, then a 6 and felt like we could probably get a 9 poly in place.  We had good stability with a 6, but I could get a little gapping and little sulcus. We dislocated the trial components.  We went ahead and removed our trial humeral components and then irrigated thoroughly the humeral shaft.  We placed #2 FiberWire suture in the lesser tuberosity for repair of the subscapularis at the end.  We then went ahead and used available bone graft in the humeral head with impaction grafting technique to impact the HA coated size 12 stem with 1 right metaphyseal component set on 0, were placed in 10 degrees of retroversion.  We impacted that in place, we had our sutures in place.  We then selected 38+ 9 poly, impacted that on the humeral side and reduced the shoulder, had nice snap as we reduced it.  Could get the patient's arm on her belly.  She had negative sulcus and no gapping with external rotation, so we felt like we had our attention right.  We do not have excessive tension on the axillary nerve.  Next, we went ahead and repaired our infraspinatus tendon back to the greater  tuberosity.  We then did a subscapularis anatomic repair back to lesser tuberosity and could externally rotate to about 20 degrees.  After we had completed that repair and we were able to put a little stitch in the rotator interval area as well, so we had a good soft tissue repair.  We were hoping all that will heal, that should give her better improved function with this reverse shoulder as she is young at 67 to have this type of placement.  We just felt that basically with over 6 months possibly year of not moving her shoulder much that she just would not be able to power a standard shoulder replacement.  Thus, we felt like we had a nice stable implant.  We thoroughly irrigated.  We then repaired deltopectoral interval with 0 Vicryl suture followed by 2- 0 Vicryl subcutaneous closure and 4-0 Monocryl for skin.  Sterile dressings applied.  The patient tolerated the surgery well.  Doran Heater. Veverly Fells, M.D.     SRN/MEDQ  D:  06/29/2014  T:  06/30/2014  Job:  [id number]

## 2014-06-30 NOTE — Plan of Care (Signed)
Problem: Consults Goal: Diagnosis - Shoulder Surgery Reverse Total Shoulder Arthroplasty     

## 2014-06-30 NOTE — Progress Notes (Signed)
Subjective: 1 Day Post-Op Procedure(s) (LRB): REVERSE TOTAL SHOULDER ARTHROPLASTY (Right) Patient reports pain as 2 on 0-10 scale.    Objective: Vital signs in last 24 hours: Temp:  [97.1 F (36.2 C)-98.3 F (36.8 C)] 98.1 F (36.7 C) (05/21 0609) Pulse Rate:  [60-96] 79 (05/21 0609) Resp:  [8-21] 16 (05/20 1258) BP: (119-152)/(60-99) 119/60 mmHg (05/21 0609) SpO2:  [93 %-99 %] 96 % (05/21 0609)  Intake/Output from previous day: 05/20 0701 - 05/21 0700 In: 1500 [I.V.:1500] Out: 400 [Blood:400] Intake/Output this shift:     Recent Labs  06/30/14 0345  HGB 10.8*    Recent Labs  06/30/14 0345  HCT 32.1*    Recent Labs  06/30/14 0345  NA 138  K 3.7  CL 103  CO2 23  BUN 10  CREATININE 0.85  GLUCOSE 138*  CALCIUM 8.4*   No results for input(s): LABPT, INR in the last 72 hours.  Neurovascular intact  Assessment/Plan: 1 Day Post-Op Procedure(s) (LRB): REVERSE TOTAL SHOULDER ARTHROPLASTY (Right) Up with therapy.DC Sunday  Monseratt Ledin A 06/30/2014, 8:06 AM

## 2014-06-30 NOTE — Evaluation (Signed)
Occupational Therapy Evaluation Patient Details Name: Connie Haynes MRN: [id number] DOB: [birth year] Today's Date: 06/30/2014    History of Present Illness Pt is a 59 y.o. Female s/p R reverse TSA.    Clinical Impression   PTA pt lived at home and was independent with ADLs. Pt is limited by decreased ROM and pain in R shoulder and requires assist for ADLs. Pt participated in therapeutic exercises for ROM and required assist. Pt will benefit from acute OT to progress to Supervision level to return home with family support.     Follow Up Recommendations  No OT follow up;Supervision/Assistance - 24 hour    Equipment Recommendations  None recommended by OT    Recommendations for Other Services       Precautions / Restrictions Precautions Precautions: Shoulder Type of Shoulder Precautions: Active Protocol: sling for comfort and sleep, AROM elbow, wrist, hand, ROM FF 0-90 and ER 0-30, NO ABD Shoulder Interventions: Shoulder sling/immobilizer;For comfort (And sleep) Precaution Booklet Issued: Yes (comment) Precaution Comments: Educated pt on shoulder precautions using handout Required Braces or Orthoses: Sling Restrictions Weight Bearing Restrictions: Yes RUE Weight Bearing: Non weight bearing      Mobility Bed Mobility               General bed mobility comments: Pt up in recliner when OT arrived  Transfers Overall transfer level: Needs assistance Equipment used: None Transfers: Sit to/from Stand Sit to Stand: Min guard         General transfer comment: Min guard for safety due to medication         ADL Overall ADL's : Needs assistance/impaired Eating/Feeding: Set up;Sitting   Grooming: Set up;Sitting   Upper Body Bathing: Minimal assitance;Sitting   Lower Body Bathing: Min guard;Sit to/from stand   Upper Body Dressing : Moderate assistance;Sitting   Lower Body Dressing: Min guard;Sit to/from stand   Toilet Transfer: Min guard;Ambulation            Functional mobility during ADLs: Min guard General ADL Comments: Pt dressed when OT arrived and familiar with compensatory techniques. She reports her family will assist.     Vision Additional Comments: No change from baseline          Pertinent Vitals/Pain Pain Assessment: 0-10 Pain Score: 3  Pain Location: R shoulder Pain Descriptors / Indicators: Aching;Sore Pain Intervention(s): Limited activity within patient's tolerance;Monitored during session;Repositioned;Premedicated before session;Ice applied     Hand Dominance Left   Extremity/Trunk Assessment Upper Extremity Assessment Upper Extremity Assessment: RUE deficits/detail RUE Deficits / Details: R reverse TSA RUE: Unable to fully assess due to pain;Unable to fully assess due to immobilization RUE Coordination: decreased gross motor   Lower Extremity Assessment Lower Extremity Assessment: Overall WFL for tasks assessed   Cervical / Trunk Assessment Cervical / Trunk Assessment: Normal   Communication Communication Communication: No difficulties   Cognition Arousal/Alertness: Awake/alert Behavior During Therapy: WFL for tasks assessed/performed Overall Cognitive Status: Within Functional Limits for tasks assessed                                Home Living Family/patient expects to be discharged to:: Private residence Living Arrangements: Spouse/significant other Available Help at Discharge: Family Type of Home: House Home Access: Stairs to enter Technical brewer of Steps: 2 Entrance Stairs-Rails: None Home Layout: Two level;Able to live on main level with bedroom/bathroom     Bathroom Shower/Tub: Walk-in shower  Home Equipment: [place] - 2 wheels;Bedside commode          Prior Functioning/Environment Level of Independence: Independent             OT Diagnosis: Generalized weakness;Acute pain   OT Problem List: Decreased strength;Decreased range of motion;Decreased  activity tolerance;Impaired UE functional use;Pain   OT Treatment/Interventions: Self-care/ADL training;Therapeutic exercise;Energy conservation;DME and/or AE instruction;Therapeutic activities;Patient/family education    OT Goals(Current goals can be found in the care plan section) Acute Rehab OT Goals Patient Stated Goal: to go home OT Goal Formulation: With patient Time For Goal Achievement: 07/14/14 Potential to Achieve Goals: Good ADL Goals Pt Will Perform Grooming: with supervision;standing Pt Will Transfer to Toilet: with supervision;ambulating Pt/caregiver will Perform Home Exercise Program: Increased ROM;Right Upper extremity;With written HEP provided;With Supervision  OT Frequency: Min 2X/week    End of Session Equipment Utilized During Treatment: Other (comment) (sling)  Activity Tolerance: Patient tolerated treatment well Patient left: in chair;with call bell/phone within reach;with family/visitor present   Time: 1109-1140 OT Time Calculation (min): 31 min Charges:  OT General Charges $OT Visit: 1 Procedure OT Evaluation $Initial OT Evaluation Tier I: 1 Procedure OT Treatments $Therapeutic Exercise: 8-22 mins G-Codes:    Villa Herb M 07-28-14, 1:41 PM  Cyndie Chime, OTR/L Occupational Therapist [phone] (pager)

## 2014-07-01 NOTE — Progress Notes (Signed)
Subjective: 2 Days Post-Op Procedure(s) (LRB): REVERSE TOTAL SHOULDER ARTHROPLASTY (Right) Patient reports pain as mild.  Tolerating regular diet.  Pain controlled with oral pain meds.  Eager to go home.  Husband at bedside.  Objective: Vital signs in last 24 hours: Temp:  [98 F (36.7 C)-98.4 F (36.9 C)] 98 F (36.7 C) (05/22 0445) Pulse Rate:  [69-91] 69 (05/22 0445) Resp:  [16] 16 (05/22 0445) BP: (99-102)/(46-50) 99/50 mmHg (05/22 0445) SpO2:  [91 %-94 %] 94 % (05/22 0445)  Intake/Output from previous day: 05/21 0701 - 05/22 0700 In: 480 [P.O.:480] Out: -  Intake/Output this shift:     Recent Labs  06/30/14 0345  HGB 10.8*    Recent Labs  06/30/14 0345  HCT 32.1*    Recent Labs  06/30/14 0345  NA 138  K 3.7  CL 103  CO2 23  BUN 10  CREATININE 0.85  GLUCOSE 138*  CALCIUM 8.4*   No results for input(s): LABPT, INR in the last 72 hours.  PE:  R UE dressed and dry.  NVI at R UE.  Assessment/Plan: 2 Days Post-Op Procedure(s) (LRB): REVERSE TOTAL SHOULDER ARTHROPLASTY (Right) D/c home today.  Wylene Simmer 07/01/2014, 9:28 AM

## 2014-07-01 NOTE — Progress Notes (Signed)
Occupational Therapy Treatment Patient Details Name: Connie Haynes MRN: [id number] DOB: 1955-12-17 Today's Date: 07/01/2014    History of present illness Pt is a 59 y.o. Female s/p R reverse TSA.    OT comments  Pt is progressing with therapeutic exercises and ADLs. Reinforced education on precautions and pt participated in therapeutic ROM with family assist correctly. Pt is safe for d/c home from OT standpoint.    Follow Up Recommendations  No OT follow up;Supervision/Assistance - 24 hour    Equipment Recommendations  None recommended by OT    Recommendations for Other Services      Precautions / Restrictions Precautions Precautions: Shoulder Type of Shoulder Precautions: Active Protocol: sling for comfort and sleep, AROM elbow, wrist, hand, ROM FF 0-90 and ER 0-30, NO ABD Shoulder Interventions: Shoulder sling/immobilizer;For comfort (and sleep) Precaution Comments: Educated pt on shoulder precautions using handout Required Braces or Orthoses: Sling Restrictions Weight Bearing Restrictions: Yes RUE Weight Bearing: Non weight bearing       Mobility Bed Mobility               General bed mobility comments: Pt sitting EOB.   Transfers Overall transfer level: Modified independent                        ADL Overall ADL's : Needs assistance/impaired                                       General ADL Comments: Session focused on reinforcing shoulder precautions and performing therapeutic exercises. Pt was unable to recall exercises from yesterday. Reviewed handout with exercises and pt performed with both OT and family assistance. Educated family member on how to assist pt with exercises including where to hold, movement, and goals.                 Cognition   Behavior During Therapy: WFL for tasks assessed/performed Overall Cognitive Status: Within Functional Limits for tasks assessed                                     Pertinent Vitals/ Pain       Pain Assessment: No/denies pain         Frequency Min 2X/week     Progress Toward Goals  OT Goals(current goals can now be found in the care plan section)  Progress towards OT goals: Progressing toward goals     Plan Discharge plan remains appropriate       End of Session Equipment Utilized During Treatment: Other (comment) (sling)   Activity Tolerance Patient tolerated treatment well   Patient Left in chair;with call bell/phone within reach;with family/visitor present   Nurse Communication          Time: 1157-2620 OT Time Calculation (min): 23 min  Charges: OT General Charges $OT Visit: 1 Procedure OT Treatments $Self Care/Home Management : 8-22 mins $Therapeutic Exercise: 8-22 mins  Juluis Rainier 07/01/2014, 8:49 AM  Cyndie Chime, OTR/L Occupational Therapist [phone] (pager)

## 2014-07-02 ENCOUNTER — Encounter (HOSPITAL_COMMUNITY): Payer: Self-pay | Admitting: Orthopedic Surgery

## 2014-07-04 NOTE — Discharge Summary (Signed)
Physician Discharge Summary   Patient ID: Connie Haynes MRN: [id number] DOB/AGE: 1955-03-23 59 y.o.  Admit date: 06/29/2014 Discharge date: 07/01/2014  Admission Diagnoses:  Active Problems:   S/P shoulder replacement   Discharge Diagnoses:  Same   Surgeries: Procedure(s): REVERSE TOTAL SHOULDER ARTHROPLASTY on 06/29/2014   Consultants: PT/OT  Discharged Condition: Stable  Hospital Course: Connie Haynes is an 59 y.o. female who was admitted 06/29/2014 with a chief complaint of No chief complaint on file. , and found to have a diagnosis of <principal problem not specified>.  They were brought to the operating room on 06/29/2014 and underwent the above named procedures.    The patient had an uncomplicated hospital course and was stable for discharge.  Recent vital signs:  Filed Vitals:   07/01/14 0445  BP: 99/50  Pulse: 69  Temp: 98 F (36.7 C)  Resp: 16    Recent laboratory studies:  Results for orders placed or performed during the hospital encounter of 06/29/14  Hemoglobin and hematocrit, blood  Result Value Ref Range   Hemoglobin 10.8 (L) 12.0 - 15.0 g/dL   HCT 32.1 (L) 36.0 - 03.5 %  Basic metabolic panel  Result Value Ref Range   Sodium 138 135 - 145 mmol/L   Potassium 3.7 3.5 - 5.1 mmol/L   Chloride 103 101 - 111 mmol/L   CO2 23 22 - 32 mmol/L   Glucose, Bld 138 (H) 65 - 99 mg/dL   BUN 10 6 - 20 mg/dL   Creatinine, Ser 0.85 0.44 - 1.00 mg/dL   Calcium 8.4 (L) 8.9 - 10.3 mg/dL   GFR calc non Af Amer >60 >60 mL/min   GFR calc Af Amer >60 >60 mL/min   Anion gap 12 5 - 15    Discharge Medications:     Medication List    TAKE these medications        amLODipine 5 MG tablet  Commonly known as:  NORVASC  Take 1 tablet (5 mg total) by mouth daily.     amoxicillin 500 MG capsule  Commonly known as:  AMOXIL  Take 2,000 mg by mouth as needed (dental procedure).     carvedilol 25 MG tablet  Commonly known as:  COREG  Take 1 tablet (25 mg total) by mouth 2  (two) times daily.     cholecalciferol 1000 UNITS tablet  Commonly known as:  VITAMIN D  Take 1,000 Units by mouth daily.     clonazePAM 2 MG tablet  Commonly known as:  KLONOPIN  Take 2 mg by mouth as needed (MRI).     CRANBERRY PO  Take 1 tablet by mouth daily.     DULoxetine 60 MG capsule  Commonly known as:  CYMBALTA  Take 1 capsule (60 mg total) by mouth every morning.     esomeprazole 20 MG capsule  Commonly known as:  NEXIUM  Take 20 mg by mouth daily as needed (indigestion.).     losartan-hydrochlorothiazide 100-12.5 MG per tablet  Commonly known as:  HYZAAR  Take 1 tablet by mouth every morning.     lubiprostone 24 MCG capsule  Commonly known as:  AMITIZA  Take 1 capsule (24 mcg total) by mouth 2 (two) times daily with a meal.     methocarbamol 500 MG tablet  Commonly known as:  ROBAXIN  Take 1 tablet (500 mg total) by mouth 3 (three) times daily as needed.     multivitamin tablet  Take 1 tablet by mouth  every morning.     oxyCODONE-acetaminophen 5-325 MG per tablet  Commonly known as:  PERCOCET/ROXICET  Take 1 tablet by mouth at bedtime as needed (pain).     oxyCODONE-acetaminophen 5-325 MG per tablet  Commonly known as:  ROXICET  Take 1-2 tablets by mouth every 4 (four) hours as needed for severe pain.     promethazine 25 MG tablet  Commonly known as:  PHENERGAN  Take 1 tab po at bedtime and then q 6 hrs as needed for nausea vomiting     rOPINIRole 1 MG tablet  Commonly known as:  REQUIP  Take 1 tablet (1 mg total) by mouth at bedtime.        Diagnostic Studies: Dg Shoulder Right Port  2014-07-09   CLINICAL DATA:  59 year old female with surgical history of shoulder replacement.  EXAM: PORTABLE RIGHT SHOULDER - 2+ VIEW  COMPARISON:  Chest x-ray 07/28/2013  FINDINGS: Early postoperative changes of reverse right shoulder hemi arthroplasty. Gas in the surgical bed.  No perihardware fracture identified.  IMPRESSION: Early postoperative changes of right  shoulder reverse hemi arthroplasty. No complicating features identified.  Signed,  [name]. [name], DO  [facility]  [facility]   Electronically Signed   By: [name] D.O.   On: 09-Jul-2014 14:08    Disposition: 01-Home or Self Care      Discharge Instructions    Call MD / Call 911    Complete by:  As directed   If you experience chest pain or shortness of breath, CALL 911 and be transported to the hospital emergency room.  If you develope a fever above 101 F, pus (white drainage) or increased drainage or redness at the wound, or calf pain, call your surgeon's office.     Constipation Prevention    Complete by:  As directed   Drink plenty of fluids.  Prune juice may be helpful.  You may use a stool softener, such as Colace (over the counter) 100 mg twice a day.  Use MiraLax (over the counter) for constipation as needed.     Diet - low sodium heart healthy    Complete by:  As directed      Increase activity slowly as tolerated    Complete by:  As directed      Non weight bearing    Complete by:  As directed   Laterality:  right  Extremity:  Upper           Follow-up Information    Follow up with NORRIS,STEVEN R, MD. Call in 2 weeks.   Specialty:  Orthopedic Surgery   Why:  [phone]   Contact information:   [address] [place] [address] [phone]        Signed: [name] 07/04/2014, 9:25 PM

## 2014-08-15 ENCOUNTER — Telehealth: Payer: Self-pay | Admitting: Pulmonary Disease

## 2014-08-15 NOTE — Telephone Encounter (Signed)
Patient says she is unable to wear CPAP machine.  She doesn't like it, but she tried to use it.  The machine makes her feel worse.  She is in a cervical collar right now and has strep throat, she has not been able to use it while having these issues. Patient is wondering if there is something else going on because she is tired all the time.  She said that she has C3-C4 moved forward and pushing on Esophagus.  Patient says that when she was in the hospital after her surgery, her oxygen level kept dropping to the 70's even on oxygen.  She said her blood pressure was dropping as well, she takes 3 blood pressure medications because her blood pressure is typically high.  Patient wants to know if the problem with her neck could be causing a lot of the issues with her breathing when she's sleeping.    Patient is aware that RA is out of office today and ok to wait until tomorrow.  RA - please advise.

## 2014-08-16 NOTE — Telephone Encounter (Signed)
Spoke with pt, she is aware of recs but does not agree with it.  States that she has not been wearing cpap and is feeling much better.  Pt scheduled for next available with TP to discuss this in office.  Nothing further needed.

## 2014-08-16 NOTE — Telephone Encounter (Signed)
Connie Haynes pt - CPAP is for severe OSA & would advise to continue using it - can lower pressure if required to 10 cm Pl make appt with TP for other issues - O2 check & CXR to ensure diaphragm function ok

## 2014-09-04 ENCOUNTER — Encounter: Payer: Self-pay | Admitting: Adult Health

## 2014-09-04 ENCOUNTER — Ambulatory Visit (INDEPENDENT_AMBULATORY_CARE_PROVIDER_SITE_OTHER): Payer: BC Managed Care – PPO | Admitting: Adult Health

## 2014-09-04 VITALS — BP 108/72 | HR 86 | Temp 98.1°F | Ht 65.0 in | Wt 250.0 lb

## 2014-09-04 DIAGNOSIS — G4733 Obstructive sleep apnea (adult) (pediatric): Secondary | ICD-10-CM | POA: Diagnosis not present

## 2014-09-04 NOTE — Progress Notes (Signed)
   Subjective:    Patient ID: Connie Haynes, female    DOB: 1955/06/06, 59 y.o.   MRN: [id number]  HPI 59 year old female with severe obstructive sleep apnea with an AHI of 40 events per hour. She was diagnosed in December 2015 and started on CPAP  09/04/2014 Follow up : CPAP  Patient presents for a follow-up for sleep apnea Patient complains that since she has started C Pap that she has not felt any better. Initially she tried to wear for several hours , had no foreseeable improvement in her symptoms. He feels that it has made her symptoms worse with restless leg daytime sleepiness. She feels better when she doesn't wear. He recently started only wearing it 2-4 hours a night. Family says that even with wearing C Pap. She still has significant apnea events that they have to shake her to wake her up. She says that she has a lot of trouble going to sleep and feels tired during the daytime.  She says that she was recently in the hospital for shoulder surgery and was wearing her C Pap and was told by the nursing her oxygen level had dropped down. Patients initial test was a home sleep study She denies any chest pain, orthopnea, PND, or increased leg swelling. She does tell me that she started Ambien.  She was advised to avoid over sedating medications and sleep meds.    Review of Systems    Constitutional:   No  weight loss, night sweats,  Fevers, chills, fatigue, or  lassitude.  HEENT:   No headaches,  Difficulty swallowing,  Tooth/dental problems, or  Sore throat,                No sneezing, itching, ear ache, nasal congestion, post nasal drip,   CV:  No chest pain,  Orthopnea, PND, swelling in lower extremities, anasarca, dizziness, palpitations, syncope.   GI  No heartburn, indigestion, abdominal pain, nausea, vomiting, diarrhea, change in bowel habits, loss of appetite, bloody stools.   Resp: No shortness of breath with exertion or at rest.  No excess mucus, no productive cough,  No  non-productive cough,  No coughing up of blood.  No change in color of mucus.  No wheezing.  No chest wall deformity  Skin: no rash or lesions.  GU: no dysuria, change in color of urine, no urgency or frequency.  No flank pain, no hematuria   MS:  No joint pain or swelling.  No decreased range of motion.  No back pain.  Psych:  No change in mood or affect. No depression or anxiety.  No memory loss.      Objective:   Physical Exam GEN: A/Ox3; pleasant , NAD, obese   HEENT:  [place]/AT,  EACs-clear, TMs-wnl, NOSE-clear, THROAT-clear, no lesions, no postnasal drip or exudate noted.   NECK:  Supple w/ fair ROM; no JVD; normal carotid impulses w/o bruits; no thyromegaly or nodules palpated; no lymphadenopathy.  RESP  Clear  P & A; w/o, wheezes/ rales/ or rhonchi.no accessory muscle use, no dullness to percussion  CARD:  RRR, no m/r/g  , no peripheral edema, pulses intact, no cyanosis or clubbing.  GI:   Soft & nt; nml bowel sounds; no organomegaly or masses detected.  Musco: Warm bil, no deformities or joint swelling noted.   Neuro: alert, no focal deficits noted.    Skin: Warm, no lesions or rashes         Assessment & Plan:

## 2014-09-04 NOTE — Assessment & Plan Note (Signed)
Severe sleep apnea without significant improvement with C Pap despite  reported usage. We'll set patient up for a C Pap titration study. Does see if we can determine what is contributing to her ongoing sleep apnea symptoms. Patient's avoid sleep medicines and over sedating medications

## 2014-09-04 NOTE — Assessment & Plan Note (Signed)
Encouraged on weight loss

## 2014-09-04 NOTE — Patient Instructions (Addendum)
We are setting you up for a CPAP titration study at the sleep lab.  Avoid Ambien please.  follow up Dr. Elsworth Soho  In 2 months and As needed

## 2014-09-05 NOTE — Progress Notes (Signed)
Reviewed & agree with plan  

## 2014-09-17 ENCOUNTER — Ambulatory Visit: Payer: BC Managed Care – PPO | Admitting: Pulmonary Disease

## 2014-10-04 ENCOUNTER — Encounter (HOSPITAL_COMMUNITY): Payer: Self-pay | Admitting: Orthopedic Surgery

## 2014-10-09 ENCOUNTER — Ambulatory Visit: Payer: BC Managed Care – PPO | Admitting: Pulmonary Disease

## 2014-11-22 ENCOUNTER — Encounter (HOSPITAL_BASED_OUTPATIENT_CLINIC_OR_DEPARTMENT_OTHER): Payer: BC Managed Care – PPO

## 2014-12-13 NOTE — H&P (Signed)
Connie Haynes is an 59 y.o. female.    Chief Complaint: right shoulder pain and right hand numbness  HPI: Pt is a 59 y.o. female complaining of subluxation of right shoulder s/p reverse total shoulder and ongoing carpal tunnel symptoms right hand. Pain had continually increased since the beginning. Pt has tried various conservative treatments which have failed to alleviate their symptoms, including injections and therapy. Various options are discussed with the patient. Risks, benefits and expectations were discussed with the patient. Patient understand the risks, benefits and expectations and wishes to proceed with surgery.   PCP:  Ival Bible, MD  D/C Plans: Home  PMH: Past Medical History  Diagnosis Date  . Wolff-Parkinson-White (WPW) syndrome   . Hypertension   . Hyperlipidemia   . IBS (irritable bowel syndrome)     controlled with med  . PONV (postoperative nausea and vomiting)     " woke up to soon"  . Cough     hx. dry cough chronic  . Disorder of tear duct system     frequent tearing of eyes  . Osteoarthritis     arthritis -joints-limited ROM shoulders more on right  . Anxiety   . History of kidney stones   . Basal cell carcinoma     eye lid  . H/O: whooping cough 2000  . Sleep apnea     cpap use settings" 5-15 automatic"    PSH: Past Surgical History  Procedure Laterality Date  . Tear duct surgery    . Cervical spine surgery      cervical fusion with hardware retained  . Total abdominal hysterectomy    . Total hip arthroplasty      x 3(rt x2, lt x1)  . Ablation surgery      1993 [facility] (Dr. Hulen Skains)  . Eyelid carcinoma excision    . Joint replacement    . Total shoulder arthroplasty Left 07/15/2012    Procedure: LEFT TOTAL SHOULDER ARTHROPLASTY;  Surgeon: Augustin Schooling, MD;  Location: [facility];  Service: Orthopedics;  Laterality: Left;  . Cardiac electrophysiology study and ablation      '93 for WPW syndrome  . Total knee arthroplasty Right 08/08/2013   Procedure: RIGHT TOTAL KNEE ARTHROPLASTY WITH SAPHENOUS NERVE SCAR EXCISION;  Surgeon: Mauri Pole, MD;  Location: [facility];  Service: Orthopedics;  Laterality: Right;  . Ureteroscopy  stent placement  . Cystoscopy/retrograde/ureteroscopy Left 12/18/2013    Procedure: CYSTOSCOPY/RETROGRADE/LEFT STENT;  Surgeon: Malka So, MD;  Location: [facility];  Service: Urology;  Laterality: Left;  . Esophagogastroduodenoscopy (egd) with propofol N/A 05/10/2014    Procedure: ESOPHAGOGASTRODUODENOSCOPY (EGD) WITH PROPOFOL;  Surgeon: Juanita Craver, MD;  Location: [facility];  Service: Endoscopy;  Laterality: N/A;  . Colonoscopy with propofol N/A 05/10/2014    Procedure: COLONOSCOPY WITH PROPOFOL;  Surgeon: Juanita Craver, MD;  Location: [facility];  Service: Endoscopy;  Laterality: N/A;  . Colon surgery      "colon detached from abdomen"  . Reverse shoulder arthroplasty Right 06/29/2014    Procedure: REVERSE TOTAL SHOULDER ARTHROPLASTY;  Surgeon: Netta Cedars, MD;  Location: [facility];  Service: Orthopedics;  Laterality: Right;    Social History:  reports that she has never smoked. She has never used smokeless tobacco. She reports that she does not drink alcohol or use illicit drugs.  Allergies:  Allergies  Allergen Reactions  . Codeine Nausea And Vomiting  . Hydrocodone Nausea Only    Medications: No current facility-administered medications for this encounter.  Current Outpatient Prescriptions  Medication Sig Dispense Refill  . amLODipine (NORVASC) 5 MG tablet Take 1 tablet (5 mg total) by mouth daily. 30 tablet 11  . amoxicillin (AMOXIL) 500 MG capsule Take 2,000 mg by mouth as needed (dental procedure).     . carvedilol (COREG) 25 MG tablet Take 1 tablet (25 mg total) by mouth 2 (two) times daily. (Patient taking differently: Take 12.5 mg by mouth 2 (two) times daily. ) 60 tablet 11  . cholecalciferol (VITAMIN D) 1000 UNITS tablet Take 1,000 Units by mouth daily.    Marland Kitchen CRANBERRY PO Take 1 tablet by  mouth daily.    Marland Kitchen esomeprazole (NEXIUM) 20 MG capsule Take 20 mg by mouth daily as needed (indigestion.).    Marland Kitchen losartan-hydrochlorothiazide (HYZAAR) 100-12.5 MG per tablet Take 1 tablet by mouth every morning. 30 tablet 3  . Multiple Vitamin (MULTIVITAMIN) tablet Take 1 tablet by mouth every morning.     Marland Kitchen oxyCODONE-acetaminophen (PERCOCET/ROXICET) 5-325 MG per tablet Take 1 tablet by mouth at bedtime as needed (pain).     . promethazine (PHENERGAN) 25 MG tablet Take 1 tab po at bedtime and then q 6 hrs as needed for nausea vomiting (Patient taking differently: Take 50 mg by mouth at bedtime. 25 mg with Requip and 25 mg with Percocet) 15 tablet 0  . zolpidem (AMBIEN) 10 MG tablet Take 1 tablet by mouth daily as needed.      No results found for this or any previous visit (from the past 48 hour(s)). No results found.  ROS: Pain with rom of the right upper extremity  Physical Exam:  Alert and oriented 59 y.o. female in no acute distress Cranial nerves 2-12 intact Cervical spine: full rom with no tenderness, nv intact distally Chest: active breath sounds bilaterally, no wheeze rhonchi or rales Heart: regular rate and rhythm, no murmur Abd: non tender non distended with active bowel sounds Hip is stable with rom  Right upper extremity s/p reverse total shoulder in good position, no dislocation currently Right palm/wrist with positive tinel's and phalen's signs No rashes or edema  Assessment/Plan Assessment: 1. Right reverse total shoulder subluxation 2. Right carpal tunnel syndrome  Plan: Patient will undergo a right shoulder poly exchange and right carpal tunnel release by Dr. Veverly Fells at [facility]. Risks benefits and expectations were discussed with the patient. Patient understand risks, benefits and expectations and wishes to proceed.

## 2014-12-19 ENCOUNTER — Other Ambulatory Visit (HOSPITAL_COMMUNITY): Payer: Self-pay | Admitting: *Deleted

## 2014-12-19 ENCOUNTER — Encounter (HOSPITAL_COMMUNITY): Payer: Self-pay

## 2014-12-19 ENCOUNTER — Encounter (HOSPITAL_COMMUNITY)
Admission: RE | Admit: 2014-12-19 | Discharge: 2014-12-19 | Disposition: A | Discharge status: Home / Self Care | Payer: BC Managed Care – PPO | Source: Ambulatory Visit | Attending: Orthopedic Surgery | Admitting: Orthopedic Surgery

## 2014-12-19 DIAGNOSIS — E785 Hyperlipidemia, unspecified: Secondary | ICD-10-CM | POA: Diagnosis not present

## 2014-12-19 DIAGNOSIS — Z96651 Presence of right artificial knee joint: Secondary | ICD-10-CM | POA: Diagnosis not present

## 2014-12-19 DIAGNOSIS — Z96612 Presence of left artificial shoulder joint: Secondary | ICD-10-CM | POA: Diagnosis not present

## 2014-12-19 DIAGNOSIS — Z6841 Body Mass Index (BMI) 40.0 and over, adult: Secondary | ICD-10-CM | POA: Diagnosis not present

## 2014-12-19 DIAGNOSIS — T8484XA Pain due to internal orthopedic prosthetic devices, implants and grafts, initial encounter: Secondary | ICD-10-CM | POA: Diagnosis not present

## 2014-12-19 DIAGNOSIS — G5601 Carpal tunnel syndrome, right upper limb: Secondary | ICD-10-CM | POA: Diagnosis not present

## 2014-12-19 DIAGNOSIS — I10 Essential (primary) hypertension: Secondary | ICD-10-CM | POA: Diagnosis not present

## 2014-12-19 DIAGNOSIS — K589 Irritable bowel syndrome without diarrhea: Secondary | ICD-10-CM | POA: Diagnosis not present

## 2014-12-19 DIAGNOSIS — G4730 Sleep apnea, unspecified: Secondary | ICD-10-CM | POA: Diagnosis not present

## 2014-12-19 DIAGNOSIS — Z96611 Presence of right artificial shoulder joint: Secondary | ICD-10-CM | POA: Diagnosis not present

## 2014-12-19 DIAGNOSIS — M1990 Unspecified osteoarthritis, unspecified site: Secondary | ICD-10-CM | POA: Diagnosis not present

## 2014-12-19 DIAGNOSIS — Z79899 Other long term (current) drug therapy: Secondary | ICD-10-CM | POA: Diagnosis not present

## 2014-12-19 DIAGNOSIS — Z96643 Presence of artificial hip joint, bilateral: Secondary | ICD-10-CM | POA: Diagnosis not present

## 2014-12-19 DIAGNOSIS — Z79891 Long term (current) use of opiate analgesic: Secondary | ICD-10-CM | POA: Diagnosis not present

## 2014-12-19 DIAGNOSIS — M25311 Other instability, right shoulder: Secondary | ICD-10-CM | POA: Diagnosis present

## 2014-12-19 DIAGNOSIS — Y831 Surgical operation with implant of artificial internal device as the cause of abnormal reaction of the patient, or of later complication, without mention of misadventure at the time of the procedure: Secondary | ICD-10-CM | POA: Diagnosis not present

## 2014-12-19 HISTORY — DX: Anemia, unspecified: D64.9

## 2014-12-19 HISTORY — DX: Personal history of urinary (tract) infections: Z87.440

## 2014-12-19 LAB — BASIC METABOLIC PANEL
Anion gap: 10 (ref 5–15)
BUN: 11 mg/dL (ref 6–20)
CO2: 23 mmol/L (ref 22–32)
Calcium: 9.9 mg/dL (ref 8.9–10.3)
Chloride: 105 mmol/L (ref 101–111)
Creatinine, Ser: 0.82 mg/dL (ref 0.44–1.00)
GFR calc Af Amer: >60 mL/min (ref >60)
GFR calc non Af Amer: >60 mL/min (ref >60)
Glucose, Bld: 100 mg/dL — ABNORMAL HIGH (ref 65–99)
Potassium: 4.3 mmol/L (ref 3.5–5.1)
Sodium: 138 mmol/L (ref 135–145)

## 2014-12-19 LAB — CBC
HCT: 37.7 % (ref 36.0–46.0)
Hemoglobin: 12.6 g/dL (ref 12.0–15.0)
MCH: 29.6 pg (ref 26.0–34.0)
MCHC: 33.4 g/dL (ref 30.0–36.0)
MCV: 88.7 fL (ref 78.0–100.0)
Platelets: 283 10*3/uL (ref 150–400)
RBC: 4.25 MIL/uL (ref 3.87–5.11)
RDW: 13.4 % (ref 11.5–15.5)
WBC: 6.3 10*3/uL (ref 4.0–10.5)

## 2014-12-19 LAB — SURGICAL PCR SCREEN
MRSA, PCR: NEGATIVE
Staphylococcus aureus: NEGATIVE

## 2014-12-19 NOTE — Progress Notes (Signed)
Pt has hx of WPW with an ablation done "years ago". She states she's not had any issues with it. Denies chest pain or sob.  EKG - 06/14/14 in [facility] Echo - 10/04/13 in [facility].  Has Sleep Apnea but unable to tolerate Cpap. Sleep Study in [facility].

## 2014-12-19 NOTE — Pre-Procedure Instructions (Signed)
Connie Haynes  12/19/2014     Your procedure is scheduled on Friday, December 21, 2014. Please call [phone] at 8 AM morning of surgery to find out time of surgery.   Report to [facility] Entrance "A" Admitting Office two hours prior to surgery start time.    Call this number if you have problems the morning of surgery: [phone]     Remember:  Do not eat food or drink liquids after midnight Thursday, 12/20/14.  Take these medicines the morning of surgery with A SIP OF WATER: Amlodipine (Norvasc), Carvedilol (Coreg), Duloxetine (Cymbalta), Nexium - if needed, Oxycodone (Percocet) - if needed  Stop  Multivitamins and Herbal medications as of today. Do not use any Aspirin products of NSAIDS (Ibuprofen, Aleve, etc.) prior to surgery.   Do not wear jewelry, make-up or nail polish.  Do not wear lotions, powders, or perfumes.  You may NOT wear deodorant.  Do not shave 48 hours prior to surgery.    Do not bring valuables to the hospital.  [facility] is not responsible for any belongings or valuables.  Contacts, dentures or bridgework may not be worn into surgery.  Leave your suitcase in the car.  After surgery it may be brought to your room.  For patients admitted to the hospital, discharge time will be determined by your treatment team.  Patients discharged the day of surgery will not be allowed to drive home.   Special instructions:  [place] - Preparing for Surgery  Before surgery, you can play an important role.  Because skin is not sterile, your skin needs to be as free of germs as possible.  You can reduce the number of germs on you skin by washing with CHG (chlorahexidine gluconate) soap before surgery.  CHG is an antiseptic cleaner which kills germs and bonds with the skin to continue killing germs even after washing.  Please DO NOT use if you have an allergy to CHG or antibacterial soaps.  If your skin becomes reddened/irritated stop using the CHG and inform  your nurse when you arrive at Short Stay.  Do not shave (including legs and underarms) for at least 48 hours prior to the first CHG shower.  You may shave your face.  Please follow these instructions carefully:   1.  Shower with CHG Soap the night before surgery and the                                morning of Surgery.  2.  If you choose to wash your hair, wash your hair first as usual with your       normal shampoo.  3.  After you shampoo, rinse your hair and body thoroughly to remove the                      Shampoo.  4.  Use CHG as you would any other liquid soap.  You can apply chg directly       to the skin and wash gently with scrungie or a clean washcloth.  5.  Apply the CHG Soap to your body ONLY FROM THE NECK DOWN.        Do not use on open wounds or open sores.  Avoid contact with your eyes, ears, mouth and genitals (private parts).  Wash genitals (private parts) with your normal soap.  6.  Wash thoroughly, paying special  attention to the area where your surgery        will be performed.  7.  Thoroughly rinse your body with warm water from the neck down.  8.  DO NOT shower/wash with your normal soap after using and rinsing off       the CHG Soap.  9.  Pat yourself dry with a clean towel.            10.  Wear clean pajamas.            11.  Place clean sheets on your bed the night of your first shower and do not        sleep with pets.  Day of Surgery  Do not apply any lotions/deodorants the morning of surgery.  Please wear clean clothes to the hospital.   Please read over the following fact sheets that you were given. Pain Booklet, Coughing and Deep Breathing, MRSA Information and Surgical Site Infection Prevention

## 2014-12-20 MED ORDER — CEFAZOLIN SODIUM-DEXTROSE 2-3 GM-% IV SOLR: 2.0000 g | INTRAVENOUS | Status: AC

## 2014-12-20 MED ORDER — CHLORHEXIDINE GLUCONATE 4 % EX LIQD: 60.0000 mL | Freq: Once | CUTANEOUS | Status: DC

## 2014-12-21 ENCOUNTER — Encounter (HOSPITAL_COMMUNITY)
Admission: RE | Disposition: A | Discharge status: Home / Self Care | Payer: Self-pay | Source: Ambulatory Visit | Attending: Orthopedic Surgery

## 2014-12-21 ENCOUNTER — Observation Stay (HOSPITAL_COMMUNITY): Payer: BC Managed Care – PPO

## 2014-12-21 ENCOUNTER — Ambulatory Visit (HOSPITAL_COMMUNITY): Payer: BC Managed Care – PPO | Admitting: Certified Registered"

## 2014-12-21 ENCOUNTER — Encounter (HOSPITAL_COMMUNITY): Payer: Self-pay | Admitting: Certified Registered"

## 2014-12-21 ENCOUNTER — Observation Stay (HOSPITAL_COMMUNITY)
Admission: RE | Admit: 2014-12-21 | Discharge: 2014-12-22 | Disposition: A | Discharge status: Home / Self Care | Payer: BC Managed Care – PPO | Source: Ambulatory Visit | Attending: Orthopedic Surgery | Admitting: Orthopedic Surgery

## 2014-12-21 DIAGNOSIS — Z96643 Presence of artificial hip joint, bilateral: Secondary | ICD-10-CM | POA: Insufficient documentation

## 2014-12-21 DIAGNOSIS — T8484XA Pain due to internal orthopedic prosthetic devices, implants and grafts, initial encounter: Principal | ICD-10-CM | POA: Insufficient documentation

## 2014-12-21 DIAGNOSIS — M1990 Unspecified osteoarthritis, unspecified site: Secondary | ICD-10-CM | POA: Insufficient documentation

## 2014-12-21 DIAGNOSIS — Z6841 Body Mass Index (BMI) 40.0 and over, adult: Secondary | ICD-10-CM | POA: Insufficient documentation

## 2014-12-21 DIAGNOSIS — Z96611 Presence of right artificial shoulder joint: Secondary | ICD-10-CM | POA: Insufficient documentation

## 2014-12-21 DIAGNOSIS — Z96619 Presence of unspecified artificial shoulder joint: Secondary | ICD-10-CM

## 2014-12-21 DIAGNOSIS — Z79899 Other long term (current) drug therapy: Secondary | ICD-10-CM | POA: Insufficient documentation

## 2014-12-21 DIAGNOSIS — G5601 Carpal tunnel syndrome, right upper limb: Secondary | ICD-10-CM | POA: Insufficient documentation

## 2014-12-21 DIAGNOSIS — G4730 Sleep apnea, unspecified: Secondary | ICD-10-CM | POA: Insufficient documentation

## 2014-12-21 DIAGNOSIS — K589 Irritable bowel syndrome without diarrhea: Secondary | ICD-10-CM | POA: Insufficient documentation

## 2014-12-21 DIAGNOSIS — Y831 Surgical operation with implant of artificial internal device as the cause of abnormal reaction of the patient, or of later complication, without mention of misadventure at the time of the procedure: Secondary | ICD-10-CM | POA: Insufficient documentation

## 2014-12-21 DIAGNOSIS — E785 Hyperlipidemia, unspecified: Secondary | ICD-10-CM | POA: Insufficient documentation

## 2014-12-21 DIAGNOSIS — M25311 Other instability, right shoulder: Secondary | ICD-10-CM | POA: Insufficient documentation

## 2014-12-21 DIAGNOSIS — Z79891 Long term (current) use of opiate analgesic: Secondary | ICD-10-CM | POA: Insufficient documentation

## 2014-12-21 DIAGNOSIS — I10 Essential (primary) hypertension: Secondary | ICD-10-CM | POA: Insufficient documentation

## 2014-12-21 DIAGNOSIS — Z96612 Presence of left artificial shoulder joint: Secondary | ICD-10-CM | POA: Insufficient documentation

## 2014-12-21 DIAGNOSIS — Z96651 Presence of right artificial knee joint: Secondary | ICD-10-CM | POA: Insufficient documentation

## 2014-12-21 HISTORY — PX: CARPAL TUNNEL RELEASE: SHX101

## 2014-12-21 HISTORY — PX: TOTAL SHOULDER REVISION: SHX6130

## 2014-12-21 SURGERY — REVISION, TOTAL ARTHROPLASTY, SHOULDER
Anesthesia: General | Site: Wrist | Laterality: Right

## 2014-12-21 MED ORDER — DEXTROSE 5 % IV SOLN
10.0000 mg | INTRAVENOUS | Status: DC | PRN
  Administered 2014-12-21: 25 ug/min via INTRAVENOUS

## 2014-12-21 MED ORDER — METOCLOPRAMIDE HCL 5 MG/ML IJ SOLN: 5.0000 mg | Freq: Three times a day (TID) | INTRAMUSCULAR | Status: DC | PRN

## 2014-12-21 MED ORDER — ONDANSETRON HCL 4 MG/2ML IJ SOLN
INTRAMUSCULAR | Status: AC
  Filled 2014-12-21: qty 2

## 2014-12-21 MED ORDER — AMLODIPINE BESYLATE 5 MG PO TABS
5.0000 mg | ORAL_TABLET | Freq: Every day | ORAL | Status: DC
Start: 2014-12-22 — End: 2014-12-22

## 2014-12-21 MED ORDER — POLYETHYLENE GLYCOL 3350 17 G PO PACK: 17.0000 g | PACK | Freq: Every day | ORAL | Status: DC | PRN

## 2014-12-21 MED ORDER — EPHEDRINE SULFATE 50 MG/ML IJ SOLN
INTRAMUSCULAR | Status: AC
  Filled 2014-12-21: qty 1

## 2014-12-21 MED ORDER — PANTOPRAZOLE SODIUM 40 MG PO TBEC: 80.0000 mg | DELAYED_RELEASE_TABLET | Freq: Every day | ORAL | Status: DC

## 2014-12-21 MED ORDER — SUCCINYLCHOLINE CHLORIDE 20 MG/ML IJ SOLN
INTRAMUSCULAR | Status: AC
  Filled 2014-12-21: qty 1

## 2014-12-21 MED ORDER — LIDOCAINE HCL (CARDIAC) 20 MG/ML IV SOLN
INTRAVENOUS | Status: DC | PRN
  Administered 2014-12-21: 50 mg via INTRAVENOUS

## 2014-12-21 MED ORDER — PROPOFOL 10 MG/ML IV BOLUS
INTRAVENOUS | Status: AC
  Filled 2014-12-21: qty 20

## 2014-12-21 MED ORDER — SCOPOLAMINE 1 MG/3DAYS TD PT72
MEDICATED_PATCH | TRANSDERMAL | Status: DC | PRN
  Administered 2014-12-21: 1 via TRANSDERMAL

## 2014-12-21 MED ORDER — ALBUMIN HUMAN 5 % IV SOLN
INTRAVENOUS | Status: DC | PRN
  Administered 2014-12-21: 17:00:00 via INTRAVENOUS

## 2014-12-21 MED ORDER — LACTATED RINGERS IV SOLN
INTRAVENOUS | Status: DC
  Administered 2014-12-21 (×2): via INTRAVENOUS

## 2014-12-21 MED ORDER — METOCLOPRAMIDE HCL 5 MG PO TABS
5.0000 mg | ORAL_TABLET | Freq: Three times a day (TID) | ORAL | Status: DC | PRN
  Administered 2014-12-22: 10 mg via ORAL
  Filled 2014-12-21: qty 2

## 2014-12-21 MED ORDER — ACETAMINOPHEN 650 MG RE SUPP: 650.0000 mg | Freq: Four times a day (QID) | RECTAL | Status: DC | PRN

## 2014-12-21 MED ORDER — ROCURONIUM BROMIDE 50 MG/5ML IV SOLN
INTRAVENOUS | Status: AC
  Filled 2014-12-21: qty 1

## 2014-12-21 MED ORDER — LOSARTAN POTASSIUM-HCTZ 100-12.5 MG PO TABS: 1.0000 | ORAL_TABLET | Freq: Every morning | ORAL | Status: DC

## 2014-12-21 MED ORDER — ONDANSETRON HCL 4 MG/2ML IJ SOLN
INTRAMUSCULAR | Status: DC | PRN
Start: 2014-12-21 — End: 2014-12-21
  Administered 2014-12-21 (×2): 4 mg via INTRAVENOUS

## 2014-12-21 MED ORDER — NEOSTIGMINE METHYLSULFATE 10 MG/10ML IV SOLN
INTRAVENOUS | Status: DC | PRN
  Administered 2014-12-21: 3 mg via INTRAVENOUS

## 2014-12-21 MED ORDER — OXYCODONE-ACETAMINOPHEN 5-325 MG PO TABS: 1.0000 | ORAL_TABLET | ORAL | Status: DC | PRN

## 2014-12-21 MED ORDER — CARVEDILOL 12.5 MG PO TABS
12.5000 mg | ORAL_TABLET | Freq: Two times a day (BID) | ORAL | Status: DC
  Administered 2014-12-22 (×2): 12.5 mg via ORAL
  Filled 2014-12-21 (×2): qty 1

## 2014-12-21 MED ORDER — PROMETHAZINE HCL 25 MG PO TABS: 25.0000 mg | ORAL_TABLET | Freq: Four times a day (QID) | ORAL | Status: DC | PRN

## 2014-12-21 MED ORDER — PROMETHAZINE HCL 25 MG PO TABS
50.0000 mg | ORAL_TABLET | Freq: Every day | ORAL | Status: DC
  Administered 2014-12-21: 50 mg via ORAL
  Filled 2014-12-21: qty 2

## 2014-12-21 MED ORDER — FENTANYL CITRATE (PF) 250 MCG/5ML IJ SOLN
INTRAMUSCULAR | Status: AC
  Filled 2014-12-21: qty 5

## 2014-12-21 MED ORDER — BISACODYL 10 MG RE SUPP: 10.0000 mg | Freq: Every day | RECTAL | Status: DC | PRN

## 2014-12-21 MED ORDER — MIDAZOLAM HCL 2 MG/2ML IJ SOLN
INTRAMUSCULAR | Status: AC
  Filled 2014-12-21: qty 2

## 2014-12-21 MED ORDER — GLYCOPYRROLATE 0.2 MG/ML IJ SOLN
INTRAMUSCULAR | Status: DC | PRN
  Administered 2014-12-21 (×2): 0.4 mg via INTRAVENOUS

## 2014-12-21 MED ORDER — DEXTROSE 5 % IV SOLN
500.0000 mg | Freq: Once | INTRAVENOUS | Status: AC
  Administered 2014-12-21: 500 mg via INTRAVENOUS
  Filled 2014-12-21: qty 5

## 2014-12-21 MED ORDER — ONDANSETRON HCL 4 MG/2ML IJ SOLN
4.0000 mg | Freq: Four times a day (QID) | INTRAMUSCULAR | Status: DC | PRN
  Administered 2014-12-22 (×2): 4 mg via INTRAVENOUS
  Filled 2014-12-21 (×2): qty 2

## 2014-12-21 MED ORDER — OXYCODONE-ACETAMINOPHEN 5-325 MG PO TABS: 1.0000 | ORAL_TABLET | Freq: Every evening | ORAL | Status: DC | PRN

## 2014-12-21 MED ORDER — SUCCINYLCHOLINE CHLORIDE 20 MG/ML IJ SOLN
INTRAMUSCULAR | Status: DC | PRN
  Administered 2014-12-21: 140 mg via INTRAVENOUS

## 2014-12-21 MED ORDER — MEPERIDINE HCL 25 MG/ML IJ SOLN: 6.2500 mg | INTRAMUSCULAR | Status: DC | PRN

## 2014-12-21 MED ORDER — ZOLPIDEM TARTRATE 5 MG PO TABS: 5.0000 mg | ORAL_TABLET | Freq: Every day | ORAL | Status: DC | PRN

## 2014-12-21 MED ORDER — LIDOCAINE HCL (CARDIAC) 20 MG/ML IV SOLN
INTRAVENOUS | Status: AC
  Filled 2014-12-21: qty 5

## 2014-12-21 MED ORDER — BUPIVACAINE-EPINEPHRINE (PF) 0.25% -1:200000 IJ SOLN
INTRAMUSCULAR | Status: AC
  Filled 2014-12-21: qty 30

## 2014-12-21 MED ORDER — DEXAMETHASONE SODIUM PHOSPHATE 10 MG/ML IJ SOLN
INTRAMUSCULAR | Status: DC | PRN
  Administered 2014-12-21: 8 mg via INTRAVENOUS

## 2014-12-21 MED ORDER — HYDROMORPHONE HCL 1 MG/ML IJ SOLN
INTRAMUSCULAR | Status: AC
  Administered 2014-12-21: 0.5 mg via INTRAVENOUS
  Filled 2014-12-21: qty 1

## 2014-12-21 MED ORDER — DEXAMETHASONE SODIUM PHOSPHATE 4 MG/ML IJ SOLN
INTRAMUSCULAR | Status: AC
  Filled 2014-12-21: qty 1

## 2014-12-21 MED ORDER — CARVEDILOL 25 MG PO TABS: 25.0000 mg | ORAL_TABLET | Freq: Two times a day (BID) | ORAL | Status: DC

## 2014-12-21 MED ORDER — OXYCODONE HCL 5 MG/5ML PO SOLN
5.0000 mg | Freq: Once | ORAL | Status: DC | PRN
Start: 2014-12-21 — End: 2014-12-21

## 2014-12-21 MED ORDER — BUPIVACAINE-EPINEPHRINE 0.25% -1:200000 IJ SOLN
INTRAMUSCULAR | Status: DC | PRN
  Administered 2014-12-21: 30 mL

## 2014-12-21 MED ORDER — CEFAZOLIN SODIUM-DEXTROSE 2-3 GM-% IV SOLR: 2.0000 g | INTRAVENOUS | Status: DC

## 2014-12-21 MED ORDER — PHENOL 1.4 % MT LIQD
1.0000 | OROMUCOSAL | Status: DC | PRN
Start: 2014-12-21 — End: 2014-12-22

## 2014-12-21 MED ORDER — ONDANSETRON HCL 4 MG PO TABS: 4.0000 mg | ORAL_TABLET | Freq: Four times a day (QID) | ORAL | Status: DC | PRN

## 2014-12-21 MED ORDER — BUPIVACAINE HCL (PF) 0.25 % IJ SOLN
INTRAMUSCULAR | Status: AC
  Filled 2014-12-21: qty 30

## 2014-12-21 MED ORDER — MENTHOL 3 MG MT LOZG: 1.0000 | LOZENGE | OROMUCOSAL | Status: DC | PRN

## 2014-12-21 MED ORDER — CEFAZOLIN SODIUM-DEXTROSE 2-3 GM-% IV SOLR
2.0000 g | Freq: Four times a day (QID) | INTRAVENOUS | Status: DC
  Administered 2014-12-22 (×2): 2 g via INTRAVENOUS
  Filled 2014-12-21 (×3): qty 50

## 2014-12-21 MED ORDER — MIDAZOLAM HCL 2 MG/2ML IJ SOLN
INTRAMUSCULAR | Status: AC
  Filled 2014-12-21: qty 4

## 2014-12-21 MED ORDER — SODIUM CHLORIDE 0.9 % IV SOLN: INTRAVENOUS | Status: DC

## 2014-12-21 MED ORDER — OXYCODONE HCL 5 MG PO TABS
5.0000 mg | ORAL_TABLET | ORAL | Status: DC | PRN
  Administered 2014-12-21 – 2014-12-22 (×2): 10 mg via ORAL
  Filled 2014-12-21: qty 2

## 2014-12-21 MED ORDER — 0.9 % SODIUM CHLORIDE (POUR BTL) OPTIME
TOPICAL | Status: DC | PRN
Start: 2014-12-21 — End: 2014-12-21
  Administered 2014-12-21: 1000 mL

## 2014-12-21 MED ORDER — CEFAZOLIN SODIUM-DEXTROSE 2-3 GM-% IV SOLR
INTRAVENOUS | Status: AC
  Administered 2014-12-21: 2 g via INTRAVENOUS
  Filled 2014-12-21: qty 50

## 2014-12-21 MED ORDER — DIPHENHYDRAMINE HCL 50 MG/ML IJ SOLN
INTRAMUSCULAR | Status: DC | PRN
  Administered 2014-12-21: 12.5 mg via INTRAVENOUS

## 2014-12-21 MED ORDER — HYDROCHLOROTHIAZIDE 12.5 MG PO CAPS: 12.5000 mg | ORAL_CAPSULE | Freq: Every day | ORAL | Status: DC

## 2014-12-21 MED ORDER — METHOCARBAMOL 500 MG PO TABS: 500.0000 mg | ORAL_TABLET | Freq: Four times a day (QID) | ORAL | Status: DC | PRN

## 2014-12-21 MED ORDER — LOSARTAN POTASSIUM 50 MG PO TABS: 100.0000 mg | ORAL_TABLET | Freq: Every day | ORAL | Status: DC

## 2014-12-21 MED ORDER — PROPOFOL 10 MG/ML IV BOLUS
INTRAVENOUS | Status: DC | PRN
  Administered 2014-12-21 (×2): 10 mg via INTRAVENOUS
  Administered 2014-12-21: 30 mg via INTRAVENOUS
  Administered 2014-12-21: 200 mg via INTRAVENOUS

## 2014-12-21 MED ORDER — LUBIPROSTONE 24 MCG PO CAPS
24.0000 ug | ORAL_CAPSULE | Freq: Two times a day (BID) | ORAL | Status: DC
  Administered 2014-12-22: 24 ug via ORAL
  Filled 2014-12-21 (×3): qty 1

## 2014-12-21 MED ORDER — PHENYLEPHRINE 40 MCG/ML (10ML) SYRINGE FOR IV PUSH (FOR BLOOD PRESSURE SUPPORT)
PREFILLED_SYRINGE | INTRAVENOUS | Status: AC
Start: 2014-12-21 — End: 2014-12-21
  Filled 2014-12-21: qty 10

## 2014-12-21 MED ORDER — ACETAMINOPHEN 325 MG PO TABS: 650.0000 mg | ORAL_TABLET | Freq: Four times a day (QID) | ORAL | Status: DC | PRN

## 2014-12-21 MED ORDER — ROPINIROLE HCL 1 MG PO TABS
4.0000 mg | ORAL_TABLET | Freq: Every day | ORAL | Status: DC
  Administered 2014-12-22: 4 mg via ORAL
  Filled 2014-12-21: qty 4

## 2014-12-21 MED ORDER — DULOXETINE HCL 60 MG PO CPEP: 60.0000 mg | ORAL_CAPSULE | Freq: Every day | ORAL | Status: DC

## 2014-12-21 MED ORDER — DOCUSATE SODIUM 100 MG PO CAPS: 100.0000 mg | ORAL_CAPSULE | Freq: Two times a day (BID) | ORAL | Status: DC

## 2014-12-21 MED ORDER — FENTANYL CITRATE (PF) 250 MCG/5ML IJ SOLN
INTRAMUSCULAR | Status: AC
Start: 2014-12-21 — End: 2014-12-21
  Filled 2014-12-21: qty 5

## 2014-12-21 MED ORDER — METHOCARBAMOL 1000 MG/10ML IJ SOLN
500.0000 mg | Freq: Four times a day (QID) | INTRAVENOUS | Status: DC | PRN
  Filled 2014-12-21: qty 5

## 2014-12-21 MED ORDER — ROCURONIUM BROMIDE 100 MG/10ML IV SOLN
INTRAVENOUS | Status: DC | PRN
  Administered 2014-12-21: 10 mg via INTRAVENOUS

## 2014-12-21 MED ORDER — MIDAZOLAM HCL 5 MG/5ML IJ SOLN
INTRAMUSCULAR | Status: DC | PRN
  Administered 2014-12-21: 2 mg via INTRAVENOUS

## 2014-12-21 MED ORDER — DIPHENHYDRAMINE HCL 50 MG/ML IJ SOLN
INTRAMUSCULAR | Status: AC
  Filled 2014-12-21: qty 1

## 2014-12-21 MED ORDER — HYDROMORPHONE HCL 1 MG/ML IJ SOLN
1.0000 mg | INTRAMUSCULAR | Status: DC | PRN
  Administered 2014-12-21 – 2014-12-22 (×3): 1 mg via INTRAVENOUS
  Filled 2014-12-21 (×3): qty 1

## 2014-12-21 MED ORDER — ADULT MULTIVITAMIN W/MINERALS CH
1.0000 | ORAL_TABLET | Freq: Every morning | ORAL | Status: DC
  Filled 2014-12-21: qty 1

## 2014-12-21 MED ORDER — OXYCODONE HCL 5 MG PO TABS: 5.0000 mg | ORAL_TABLET | Freq: Once | ORAL | Status: DC | PRN

## 2014-12-21 MED ORDER — OXYCODONE HCL 5 MG PO TABS
ORAL_TABLET | ORAL | Status: AC
  Filled 2014-12-21: qty 2

## 2014-12-21 MED ORDER — BUPIVACAINE HCL (PF) 0.25 % IJ SOLN
INTRAMUSCULAR | Status: DC | PRN
  Administered 2014-12-21: 8 mL

## 2014-12-21 MED ORDER — HYDROMORPHONE HCL 1 MG/ML IJ SOLN
0.2500 mg | INTRAMUSCULAR | Status: DC | PRN
  Administered 2014-12-21 (×2): 0.5 mg via INTRAVENOUS

## 2014-12-21 MED ORDER — FENTANYL CITRATE (PF) 100 MCG/2ML IJ SOLN
INTRAMUSCULAR | Status: DC | PRN
  Administered 2014-12-21 (×3): 50 ug via INTRAVENOUS
  Administered 2014-12-21: 100 ug via INTRAVENOUS
  Administered 2014-12-21 (×3): 50 ug via INTRAVENOUS

## 2014-12-21 MED ORDER — VITAMIN D 1000 UNITS PO TABS: 1000.0000 [IU] | ORAL_TABLET | Freq: Every day | ORAL | Status: DC

## 2014-12-21 MED ORDER — AMOXICILLIN 500 MG PO CAPS: 2000.0000 mg | ORAL_CAPSULE | ORAL | Status: DC | PRN

## 2014-12-21 SURGICAL SUPPLY — 99 items
BANDAGE ELASTIC 3 VELCRO ST LF (GAUZE/BANDAGES/DRESSINGS) ×6 IMPLANT
BANDAGE ELASTIC 4 VELCRO ST LF (GAUZE/BANDAGES/DRESSINGS) ×3 IMPLANT
BNDG COHESIVE 3X5 TAN STRL LF (GAUZE/BANDAGES/DRESSINGS) ×3 IMPLANT
BNDG ESMARK 4X9 LF (GAUZE/BANDAGES/DRESSINGS) ×3 IMPLANT
BNDG GAUZE ELAST 4 BULKY (GAUZE/BANDAGES/DRESSINGS) ×3 IMPLANT
BOWL SMART MIX CTS (DISPOSABLE) IMPLANT
BRUSH FEMORAL CANAL (MISCELLANEOUS) IMPLANT
BUR SURG 4X8 MED (BURR) IMPLANT
BURR SURG 4X8 MED (BURR)
COVER SURGICAL LIGHT HANDLE (MISCELLANEOUS) ×6 IMPLANT
CUFF TOURNIQUET SINGLE 18IN (TOURNIQUET CUFF) ×3 IMPLANT
CUFF TOURNIQUET SINGLE 24IN (TOURNIQUET CUFF) IMPLANT
CUP D38 DXTEND STAND PLUS 6 HU (Orthopedic Implant) ×3 IMPLANT
CUP STD D38 DXTEND PLUS 6 HU (Orthopedic Implant) ×2 IMPLANT
DRAPE INCISE IOBAN 66X45 STRL (DRAPES) ×9 IMPLANT
DRAPE SURG 17X23 STRL (DRAPES) ×3 IMPLANT
DRAPE U-SHAPE 47X51 STRL (DRAPES) ×3 IMPLANT
DRAPE X-RAY CASS 24X20 (DRAPES) IMPLANT
DRILL BIT 5/64 (BIT) IMPLANT
DRSG ADAPTIC 3X8 NADH LF (GAUZE/BANDAGES/DRESSINGS) ×3 IMPLANT
DRSG PAD ABDOMINAL 8X10 ST (GAUZE/BANDAGES/DRESSINGS) ×6 IMPLANT
DURAPREP 26ML APPLICATOR (WOUND CARE) ×6 IMPLANT
ELECT BLADE 4.0 EZ CLEAN MEGAD (MISCELLANEOUS) ×3
ELECT NEEDLE TIP 2.8 STRL (NEEDLE) ×3 IMPLANT
ELECT REM PT RETURN 9FT ADLT (ELECTROSURGICAL) ×3
ELECTRODE BLDE 4.0 EZ CLN MEGD (MISCELLANEOUS) ×2 IMPLANT
ELECTRODE REM PT RTRN 9FT ADLT (ELECTROSURGICAL) ×2 IMPLANT
GAUZE SPONGE 4X4 12PLY STRL (GAUZE/BANDAGES/DRESSINGS) ×3 IMPLANT
GAUZE XEROFORM 1X8 LF (GAUZE/BANDAGES/DRESSINGS) IMPLANT
GAUZE XEROFORM 5X9 LF (GAUZE/BANDAGES/DRESSINGS) ×3 IMPLANT
GLOVE BIOGEL PI IND STRL 6.5 (GLOVE) ×4 IMPLANT
GLOVE BIOGEL PI IND STRL 7.0 (GLOVE) ×4 IMPLANT
GLOVE BIOGEL PI INDICATOR 6.5 (GLOVE) ×2
GLOVE BIOGEL PI INDICATOR 7.0 (GLOVE) ×2
GLOVE BIOGEL PI ORTHO PRO 7.5 (GLOVE) ×2
GLOVE BIOGEL PI ORTHO PRO SZ8 (GLOVE) ×2
GLOVE ORTHO TXT STRL SZ7.5 (GLOVE) ×6 IMPLANT
GLOVE PI ORTHO PRO STRL 7.5 (GLOVE) ×4 IMPLANT
GLOVE PI ORTHO PRO STRL SZ8 (GLOVE) ×4 IMPLANT
GLOVE SURG ORTHO 8.5 STRL (GLOVE) ×6 IMPLANT
GOWN STRL REUS W/ TWL LRG LVL3 (GOWN DISPOSABLE) ×4 IMPLANT
GOWN STRL REUS W/ TWL XL LVL3 (GOWN DISPOSABLE) ×8 IMPLANT
GOWN STRL REUS W/TWL LRG LVL3 (GOWN DISPOSABLE) ×2
GOWN STRL REUS W/TWL XL LVL3 (GOWN DISPOSABLE) ×4
HANDPIECE INTERPULSE COAX TIP (DISPOSABLE)
HUMERAL SPACER (Trauma) ×3 IMPLANT
KIT BASIN OR (CUSTOM PROCEDURE TRAY) ×3 IMPLANT
KIT ROOM TURNOVER OR (KITS) ×3 IMPLANT
MANIFOLD NEPTUNE II (INSTRUMENTS) ×3 IMPLANT
NDL SUT 6 .5 CRC .975X.05 MAYO (NEEDLE) ×2 IMPLANT
NEEDLE 1/2 CIR MAYO (NEEDLE) IMPLANT
NEEDLE HYPO 25GX1X1/2 BEV (NEEDLE) ×3 IMPLANT
NEEDLE MAYO TAPER (NEEDLE) ×1
NS IRRIG 1000ML POUR BTL (IV SOLUTION) ×3 IMPLANT
PACK ORTHO EXTREMITY (CUSTOM PROCEDURE TRAY) ×3 IMPLANT
PACK SHOULDER (CUSTOM PROCEDURE TRAY) ×3 IMPLANT
PAD ARMBOARD 7.5X6 YLW CONV (MISCELLANEOUS) ×6 IMPLANT
PAD CAST 4YDX4 CTTN HI CHSV (CAST SUPPLIES) ×4 IMPLANT
PADDING CAST ABS 4INX4YD NS (CAST SUPPLIES) ×1
PADDING CAST ABS COTTON 4X4 ST (CAST SUPPLIES) ×2 IMPLANT
PADDING CAST COTTON 4X4 STRL (CAST SUPPLIES) ×2
SET HNDPC FAN SPRY TIP SCT (DISPOSABLE) IMPLANT
SLING ARM IMMOBILIZER LRG (SOFTGOODS) ×3 IMPLANT
SLING ARM IMMOBILIZER MED (SOFTGOODS) IMPLANT
SLING ARM IMMOBILIZER XL (CAST SUPPLIES) ×3 IMPLANT
SPACER HUMERAL (Trauma) ×2 IMPLANT
SPACER STAND PE CUP D38 PLUS 9 (Orthopedic Implant) ×3 IMPLANT
SPACER STD PE CUP D38 PLUS 9 (Orthopedic Implant) ×2 IMPLANT
SPLINT PLASTER CAST XFAST 4X15 (CAST SUPPLIES) ×2 IMPLANT
SPLINT PLASTER XTRA FAST SET 4 (CAST SUPPLIES) ×1
SPONGE GAUZE 4X4 12PLY STER LF (GAUZE/BANDAGES/DRESSINGS) ×6 IMPLANT
SPONGE LAP 18X18 X RAY DECT (DISPOSABLE) ×3 IMPLANT
SPONGE LAP 4X18 X RAY DECT (DISPOSABLE) ×3 IMPLANT
SPONGE SCRUB IODOPHOR (GAUZE/BANDAGES/DRESSINGS) IMPLANT
STAPLER VISISTAT 35W (STAPLE) ×3 IMPLANT
STRIP CLOSURE SKIN 1/2X4 (GAUZE/BANDAGES/DRESSINGS) ×3 IMPLANT
SUCTION FRAZIER TIP 10 FR DISP (SUCTIONS) ×3 IMPLANT
SUT ETHILON 3 0 PS 1 (SUTURE) ×3 IMPLANT
SUT FIBERWIRE #2 38 T-5 BLUE (SUTURE) ×6
SUT MNCRL AB 4-0 PS2 18 (SUTURE) ×3 IMPLANT
SUT VIC AB 0 CT1 27 (SUTURE) ×2
SUT VIC AB 0 CT1 27XBRD ANBCTR (SUTURE) ×4 IMPLANT
SUT VIC AB 2-0 CT1 27 (SUTURE) ×1
SUT VIC AB 2-0 CT1 TAPERPNT 27 (SUTURE) ×2 IMPLANT
SUT VICRYL AB 2 0 TIES (SUTURE) ×3 IMPLANT
SUTURE FIBERWR #2 38 T-5 BLUE (SUTURE) ×4 IMPLANT
SWAB CULTURE LIQUID MINI MALE (MISCELLANEOUS) ×3 IMPLANT
SYR CONTROL 10ML LL (SYRINGE) ×3 IMPLANT
SYR TOOMEY 50ML (SYRINGE) ×3 IMPLANT
TAPE CLOTH SURG 6X10 WHT LF (GAUZE/BANDAGES/DRESSINGS) ×3 IMPLANT
TOWEL OR 17X24 6PK STRL BLUE (TOWEL DISPOSABLE) ×3 IMPLANT
TOWEL OR 17X26 10 PK STRL BLUE (TOWEL DISPOSABLE) ×3 IMPLANT
TOWER CARTRIDGE SMART MIX (DISPOSABLE) IMPLANT
TRAY FOLEY CATH 16FRSI W/METER (SET/KITS/TRAYS/PACK) ×3 IMPLANT
TUBE ANAEROBIC SPECIMEN COL (MISCELLANEOUS) ×3 IMPLANT
TUBE CONNECTING 12X1/4 (SUCTIONS) IMPLANT
UNDERPAD 30X30 INCONTINENT (UNDERPADS AND DIAPERS) ×3 IMPLANT
WATER STERILE IRR 1000ML POUR (IV SOLUTION) IMPLANT
YANKAUER SUCT BULB TIP NO VENT (SUCTIONS) IMPLANT

## 2014-12-21 NOTE — Anesthesia Procedure Notes (Signed)
Procedure Name: Intubation Date/Time: 12/21/2014 3:30 PM Performed by: Lavell Luster Pre-anesthesia Checklist: Patient identified, Emergency Drugs available, Suction available, Patient being monitored and Timeout performed Patient Re-evaluated:Patient Re-evaluated prior to inductionOxygen Delivery Method: Circle system utilized Preoxygenation: Pre-oxygenation with 100% oxygen Intubation Type: IV induction Ventilation: Mask ventilation without difficulty Laryngoscope Size: Mac and 3 Grade View: Grade I Tube type: Oral Tube size: 7.0 mm Number of attempts: 1 Airway Equipment and Method: Stylet Secured at: 21 cm Tube secured with: Tape Dental Injury: Teeth and Oropharynx as per pre-operative assessment

## 2014-12-21 NOTE — Brief Op Note (Signed)
12/21/2014  5:23 PM  PATIENT:  Connie Haynes  59 y.o. 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 AND RIGHT CARPAL TUNNEL  PROCEDURE:  Procedure(s): RIGHT SHOULDER POLY EXCHANGE  (Right) RIGHT CARPAL TUNNEL RELEASE  (Right)  SURGEON:  Surgeon(s) and Role:    * Netta Cedars, MD - Primary  PHYSICIAN ASSISTANT:   ASSISTANTS: [name], PA-C   ANESTHESIA:   general  EBL:  Total I/O In: 250 [IV Piggyback:250] Out: 600 [Blood:600]  BLOOD ADMINISTERED:none  DRAINS: none   LOCAL MEDICATIONS USED:  MARCAINE     SPECIMEN:  No Specimen  DISPOSITION OF SPECIMEN:  micro  COUNTS:  YES  TOURNIQUET:   Total Tourniquet Time Documented: Forearm (Right) - 17 minutes Total: Forearm (Right) - 17 minutes   DICTATION: .Other Dictation: Dictation Number [id number]  PLAN OF CARE: Admit for overnight observation  PATIENT DISPOSITION:  PACU - hemodynamically stable.   Delay start of Pharmacological VTE agent (>24hrs) due to surgical blood loss or risk of bleeding: no

## 2014-12-21 NOTE — Anesthesia Postprocedure Evaluation (Signed)
  Anesthesia Post-op Note  Patient: Connie Haynes  Procedure(s) Performed: Procedure(s): RIGHT SHOULDER POLY EXCHANGE  (Right) RIGHT CARPAL TUNNEL RELEASE  (Right)  Patient Location: PACU  Anesthesia Type:General  Level of Consciousness: awake, alert  and oriented  Airway and Oxygen Therapy: Patient Spontanous Breathing  Post-op Pain: mild  Post-op Assessment: Post-op Vital signs reviewed, Patient's Cardiovascular Status Stable, Respiratory Function Stable, Patent Airway and Pain level controlled              Post-op Vital Signs: stable  Last Vitals:  Filed Vitals:   12/21/14 1750  BP:   Pulse:   Temp: 36.2 C  Resp:     Complications: No apparent anesthesia complications

## 2014-12-21 NOTE — Interval H&P Note (Signed)
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 After consideration of risks, benefits and other options for treatment, the patient has consented to  Procedure(s): RIGHT SHOULDER POLY EXCHANGE  (Right) RIGHT CARPAL TUNNEL RELEASE  (Right) as a surgical intervention .  The patient's history has been reviewed, patient examined, no change in status, stable for surgery.  I have reviewed the patient's chart and labs.  Questions were answered to the patient's satisfaction.     Niambi Smoak,STEVEN R

## 2014-12-21 NOTE — Transfer of Care (Signed)
Immediate Anesthesia Transfer of Care Note  Patient: Connie Haynes  Procedure(s) Performed: Procedure(s): RIGHT SHOULDER POLY EXCHANGE  (Right) RIGHT CARPAL TUNNEL RELEASE  (Right)  Patient Location: PACU  Anesthesia Type:General  Level of Consciousness: awake, alert , oriented and patient cooperative  Airway & Oxygen Therapy: Patient Spontanous Breathing and Patient connected to face mask oxygen  Post-op Assessment: Report given to RN, Post -op Vital signs reviewed and stable and Patient moving all extremities  Post vital signs: Reviewed and stable  Last Vitals:  BP 146/79 HR 75 RR 19 SpO2 95% Resting comfortably, maintains good airway .  Complications: No apparent anesthesia complications

## 2014-12-21 NOTE — Discharge Instructions (Signed)
Ice to the shoulder as much as possible.  Keep the right hand elevated at heart level if possible.  Prop a pillow behind the right shoulder/arm to keep the arm across the waist.  Keep the splint clean and dry. Wiggle fingers  Keep the shoulder incision clean and dry and covered for one week, then ok to shower.  Follow up in the office in two weeks  [phone]

## 2014-12-21 NOTE — Anesthesia Preprocedure Evaluation (Addendum)
Anesthesia Evaluation  Patient identified by MRN, date of birth, ID band Patient awake    Reviewed: Allergy & Precautions, NPO status , Patient's Chart, lab work & pertinent test results  History of Anesthesia Complications (+) PONV  Airway Mallampati: II  TM Distance: >3 FB Neck ROM: Full    Dental  (+) Teeth Intact, Dental Advisory Given   Pulmonary sleep apnea ,    breath sounds clear to auscultation       Cardiovascular hypertension, Pt. on medications and Pt. on home beta blockers  Rhythm:Regular Rate:Normal     Neuro/Psych Anxiety Depression    GI/Hepatic negative GI ROS, Neg liver ROS,   Endo/Other  Morbid obesity  Renal/GU Renal disease     Musculoskeletal  (+) Arthritis ,   Abdominal (+)  Abdomen: soft. Bowel sounds: normal.  Peds  Hematology  (+) anemia ,   Anesthesia Other Findings   Reproductive/Obstetrics                           Anesthesia Physical Anesthesia Plan  ASA: III  Anesthesia Plan: General   Post-op Pain Management:    Induction: Intravenous  Airway Management Planned: Oral ETT  Additional Equipment:   Intra-op Plan:   Post-operative Plan: Extubation in OR  Informed Consent: I have reviewed the patients History and Physical, chart, labs and discussed the procedure including the risks, benefits and alternatives for the proposed anesthesia with the patient or authorized representative who has indicated his/her understanding and acceptance.   Dental advisory given  Plan Discussed with: CRNA, Anesthesiologist and Surgeon  Anesthesia Plan Comments: (Pt has untreated OSA not compliant with CPAP.  She will need to stay overnight in the hospital for Sp02 monitoring.)        Anesthesia Quick Evaluation

## 2014-12-22 DIAGNOSIS — T8484XA Pain due to internal orthopedic prosthetic devices, implants and grafts, initial encounter: Secondary | ICD-10-CM | POA: Diagnosis not present

## 2014-12-22 NOTE — Progress Notes (Signed)
   Subjective: 1 Day Post-Op Procedure(s) (LRB): RIGHT SHOULDER POLY EXCHANGE  (Right) RIGHT CARPAL TUNNEL RELEASE  (Right)  Doing well this morning Mild pain in shoulder and hand but ready for d/c home Patient reports pain as mild.  Objective:   VITALS:   Filed Vitals:   12/22/14 0437  BP: 115/62  Pulse: 69  Temp: 97.8 F (36.6 C)  Resp: 16    Right shoulder dressing and splint intact nv intact distally Sling in place    LABS  Recent Labs  12/19/14 0944  HGB 12.6  HCT 37.7  WBC 6.3  PLT 283     Recent Labs  12/19/14 0944  NA 138  K 4.3  BUN 11  CREATININE 0.82  GLUCOSE 100*     Assessment/Plan: 1 Day Post-Op Procedure(s) (LRB): RIGHT SHOULDER POLY EXCHANGE  (Right) RIGHT CARPAL TUNNEL RELEASE  (Right) D/c home today  F/u in 2 weeks  Merla Riches, MPAS, PA-C  12/22/2014, 7:20 AM

## 2014-12-22 NOTE — Op Note (Signed)
Connie Haynes, HORSE                  ACCOUNT NO.:  [id number]  MEDICAL RECORD NO.:  [id number]  LOCATION:  [id number]                        FACILITY:  [facility]  PHYSICIAN:  Doran Heater. Veverly Fells, M.D. DATE OF BIRTH:  [birth year]  DATE OF PROCEDURE:  12/21/2014 DATE OF DISCHARGE:                              OPERATIVE REPORT   PREOPERATIVE DIAGNOSIS:  Right shoulder instability following reverse shoulder arthroplasty and right hand carpal tunnel syndrome, severe.  POSTOPERATIVE DIAGNOSIS:  Right shoulder instability following reverse shoulder arthroplasty and right hand carpal tunnel syndrome, severe.  PROCEDURE PERFORMED:  Right shoulder polyethylene exchange for total shoulder replacement and right open carpal tunnel release.  ATTENDING SURGEON:  Doran Heater. Veverly Fells, MD.  ASSISTANT:  Charletta Cousin Dixon, [place], who scrubbed the entire procedure and was necessary for satisfactory completion of surgery.  ANESTHESIA:  General anesthesia was used.  ESTIMATED BLOOD LOSS:  100 mL.  FLUID REPLACEMENT:  1000 mL crystalloid.  INSTRUMENT COUNTS:  Correct.  COMPLICATIONS:  There were no complications.  ANTIBIOTICS:  Perioperative antibiotics were given.  INDICATIONS:  The patient is a 59 year old female who is status post right reverse total shoulder arthroplasty.  Initially, the patient did very well with excellent pain relief and return of function.  The patient recently has complained of feeling of instability.  It has not formally dislocated, but it felt like it wanted to.  She has also had a worsening carpal tunnel syndrome status post carpal tunnel release on the left previously and has done well.  She presents desiring right carpal tunnel release and right shoulder polyethylene exchange to tighten the shoulder up, done during the same surgical setting.  The risks and benefits of surgery were discussed.  Informed consent was obtained.  DESCRIPTION OF PROCEDURE:  After an adequate level of  anesthesia was achieved, the patient was positioned in a supine position.  The right arm was correctly identified.  A nonsterile tourniquet was placed in the proximal forearm, the right arm, and sterilely prepped and draped from the performed tourniquet down to the hand.  A time-out was called.  We did go ahead and used local anesthesia in the carpal canal and in the palmar incision with 0.25% Marcaine without epinephrine prior to initiating the skin incision.  Once we had that Marcaine in, we did go ahead and do a small palmar skin incision line with the third and fourth ray with careful not to violate the palmar/wrist crease.  We used loupe magnification.  Charletta Cousin Dixon, PA-C scrubbed during this portion and the remainder of the surgery.  We dissected down through subcutaneous tissues.  We split the superficial palmar fascia in line with the skin incision and then identified the transverse carpal ligament, divided that in its entirety from the superficial forearm fascia into the mid palmar space.  We had complete release of the median nerve.  We protected the median nerve during the release which again was done under loupe magnification.  Once we were sure that we had a complete carpal tunnel decompression and release of the nerve that was in good shape, we irrigated the wound thoroughly and closed it with interrupted nylon suture.  We placed  a sterile compressive bandage, deflated the tourniquet at 14 minutes and 300 mmHg.  We then went ahead and completely changed our sterile prep and drape.  We removed the forearm tourniquet, set the patient up in modified beach chair position, ensured the patient was secured to the table, and all neurovascular structures were padded appropriately.  I did an exam under anesthesia in the right shoulder.  I did not feel any gross laxity in the shoulder.  We then sterilely prepped and draped the shoulder and arm in the usual manner. A second  time-out was called.  We used the patient's prior deltopectoral incision starting at the coracoid process extending down to the anterior humerus, dissection down through subcutaneous tissues.  We identified the plane between the deltoid and pectoralis muscle.  We developed that with a needle-tip Bovie, and then a Cobb elevator.  We retracted the conjoined tendon medially.  We then went ahead and identified the pseudo- capsule and opened that up.  Clear fluid was expressed.  We did go and culture this and sent it for an aerobic, anaerobic, and gram stain.  We dissected enough of the upper portion of the scarred subscap remnant. We left the lower portion intact, and we also removed scar tissue in the rotator interval window including some suture material.  We were then able to extend the shoulder and externally rotate, and I was able to look at the articulation to me.  It did appear loose.  I was able to sublux and almost dislocate the shoulder just with the patient in her semi-sitting position without a whole lot of force, and was unable to; after a little bit more releasing, dislocate the shoulder.  We went ahead and removed it and placed a trial +9 metal shim within a +6 poly and that seemed to fit her quite well.  We then removed the trial components.  We then impacted.  We also checked the stem integrity which was nice since stout and strong and secured.  We then impacted the +9 metal shim for the Delta Xtend reverse shoulder; and then, used a 38+6 initially; and then, we removed that and did a 38+9 as there was still a little bit of a slight sulcus even with the real 38+6 in place.  So, we did exchange for 38+9, and we were happy with that build up of 1.9 cm on the humeral side.  This seemed to secure within nicely.  Conjoined tendon was nice and tight, especially on extension if anything.  She had a tiny bit of slight sulcus in flexion but not very much.  We checked the axillary  nerve; it was not under undue tension.  We irrigated thoroughly and closed the deltopectoral interval with interrupted #1 Vicryl suture, followed by 2-0 Vicryl in the subcutaneous and staples. A sterile bandage was applied.  The patient was taken to the recovery room in stable condition.     Doran Heater. Veverly Fells, M.D.     SRN/MEDQ  D:  12/21/2014  T:  12/22/2014  Job:  [id number]

## 2014-12-22 NOTE — Evaluation (Signed)
Occupational Therapy Evaluation Patient Details Name: Connie Haynes MRN: [id number] DOB: 09-18-1955 Today's Date: 12/22/2014    History of Present Illness Connie Haynes is an 59 y.o. female who was admitted 12/21/2014. Pt is s/p right shoulder replacement.    Clinical Impression   Patient evaluated by OT. Patient plans to d/c home with husband who works during the day. No further acute OT needs at this time. Recommending OPOT once MD clears and finds appropriate. D/C shoulder handout given and reviewed with patient.Please see instructions completed and educated on below. Exercises that were completed are in documented in docflow sheets. Patient aware of all restrictions and limitations. FF -0-90*, Abduction -0-60*, and ER -0-30*. Patient independently able to direct caregiver prn for assistance with ADLs and caregiver shows competency. Pt with shoulder surgery ~5 months ago.     Follow Up Recommendations  No OT follow up;Supervision/Assistance - 24 hour    Equipment Recommendations  None recommended by OT    Recommendations for Other Services  None at this time    Precautions / Restrictions Precautions Precautions: Shoulder Type of Shoulder Precautions: FF 0-90*, Abduction 0-60*, ER 0-30*, AROM to elbow, wrist, fingers OK Shoulder Interventions: Shoulder sling/immobilizer;For comfort (and during sleep) Restrictions Weight Bearing Restrictions: Yes RUE Weight Bearing: Non weight bearing    Mobility Bed Mobility General bed mobility comments: Pt found seated EOB. Discussed bed mobility with patient and reiterated importance of NWB to RUE.   Transfers Overall transfer level: Needs assistance Equipment used: None Transfers: Sit to/from Stand Sit to Stand: Supervision General transfer comment: increased time needed, husband able to provide needed assistance prn.     Balance Overall balance assessment: Needs assistance Sitting-balance support: No upper extremity supported;Feet  supported Sitting balance-Leahy Scale: Good     Standing balance support: No upper extremity supported;During functional activity Standing balance-Leahy Scale: Good    ADL Overall ADL's : Needs assistance/impaired Eating/Feeding: Set up;Sitting   Grooming: Set up;Standing   Upper Body Bathing: Standing;Moderate assistance   Lower Body Bathing: Sit to/from stand;Minimal assistance   Upper Body Dressing : Moderate assistance;Sitting   Lower Body Dressing: Moderate assistance;Sit to/from stand   Toilet Transfer: Supervision/safety           Functional mobility during ADLs: Supervision/safety General ADL Comments: Pt requires increased assistance secondary to recent shoulder replacement surgery. Pt's husband able to assist prn and appropriately. Pt with reverse TSA ~5 months ago. Reiterated all precautions/restricitions, bathing & dressing techniques, and exercises. Also discussed use of 3-in-1 over toilet seat and in walk-in shower for safe transfers. Educated husband on how he can assist patient up when sitting on lower surfaces.     Pertinent Vitals/Pain Pain Assessment: 0-10 Pain Score: 10-Worst pain ever Pain Location: right shoulder during movement, 6/10 during rest Pain Descriptors / Indicators: Aching;Discomfort;Grimacing;Guarding Pain Intervention(s): Limited activity within patient's tolerance;Monitored during session;Repositioned;Ice applied     Hand Dominance Left   Extremity/Trunk Assessment Upper Extremity Assessment Upper Extremity Assessment: Generalized weakness;RUE deficits/detail;LUE deficits/detail RUE Deficits / Details: limited ROM due to recent shoulder replacement  LUE Deficits / Details: recent carpal tunnel release    Lower Extremity Assessment Lower Extremity Assessment: Generalized weakness   Cervical / Trunk Assessment Cervical / Trunk Assessment: Normal   Communication Communication Communication: No difficulties   Cognition  Arousal/Alertness: Awake/alert Behavior During Therapy: WFL for tasks assessed/performed Overall Cognitive Status: Within Functional Limits for tasks assessed         Shoulder Instructions Shoulder Instructions Donning/doffing shirt without  moving shoulder: Minimal assistance;Patient able to independently direct caregiver;Caregiver independent with task Method for sponge bathing under operated UE: Minimal assistance;Patient able to independently direct caregiver;Caregiver independent with task Donning/doffing sling/immobilizer: Moderate assistance;Patient able to independently direct caregiver;Caregiver independent with task Correct positioning of sling/immobilizer: Supervision/safety;Caregiver independent with task Pendulum exercises (written home exercise program):  (n/a) ROM for elbow, wrist and digits of operated UE: Minimal assistance;Patient able to independently direct caregiver;Caregiver independent with task Sling wearing schedule (on at all times/off for ADL's): Independent Proper positioning of operated UE when showering: Supervision/safety;Caregiver independent with task Positioning of UE while sleeping: Supervision/safety;Caregiver independent with task    Home Living Family/patient expects to be discharged to:: Private residence Living Arrangements: Spouse/significant other Available Help at Discharge: Family Type of Home: House Home Access: Stairs to enter Technical brewer of Steps: 2   [place]: Two level;Able to live on main level with bedroom/bathroom     Bathroom Shower/Tub: Occupational psychologist: Standard     Home Equipment: Environmental consultant - 2 wheels;Bedside commode;Cane - single point    Prior Functioning/Environment Level of Independence: Independent      OT Diagnosis: Generalized weakness;Acute pain   OT Problem List:  n/a, no acute OT needs identified    OT Treatment/Interventions:   n/a, no acute OT needs identified    OT  Goals(Current goals can be found in the care plan section) Acute Rehab OT Goals Patient Stated Goal: go home, decrease pain and increase independence  OT Goal Formulation: All assessment and education complete, DC therapy  OT Frequency:  n/a, no acute OT needs identified    Barriers to D/C:  None known at this time    End of Session Equipment Utilized During Treatment: Other (comment) (sling) Nurse Communication: Other (comment);Mobility status (pt ready for d/c from OT standpoing)  Activity Tolerance: Patient tolerated treatment well Patient left: in chair;with call bell/phone within reach;with family/visitor present   Time: 0821-0900 OT Time Calculation (min): 39 min Charges:  OT General Charges $OT Visit: 1 Procedure OT Evaluation $Initial OT Evaluation Tier I: 1 Procedure OT Treatments $Self Care/Home Management : 8-22 mins $Therapeutic Exercise: 8-22 mins G-Codes: OT G-codes **NOT FOR INPATIENT CLASS** Functional Limitation: Self care Self Care Current Status [id number]): At least 20 percent but less than 40 percent impaired, limited or restricted Self Care Goal Status [id number]): At least 20 percent but less than 40 percent impaired, limited or restricted Self Care Discharge Status [phone]): At least 20 percent but less than 40 percent impaired, limited or restricted  Braylee Bosher , MS, OTR/L, CLT Pager: [phone]  12/22/2014, 9:20 AM

## 2014-12-22 NOTE — Discharge Summary (Signed)
Physician Discharge Summary   Patient ID: Connie Haynes MRN: [id number] DOB/AGE: [id number] y.o.  Admit date: 12/21/2014 Discharge date: 12/22/2014  Admission Diagnoses:  Active Problems:   S/P shoulder replacement   Discharge Diagnoses:  Same   Surgeries: Procedure(s): RIGHT SHOULDER POLY EXCHANGE  RIGHT CARPAL TUNNEL RELEASE  on 12/21/2014   Consultants: PT/OT  Discharged Condition: Stable  Hospital Course: Connie Haynes is an 59 y.o. female who was admitted 12/21/2014 with a chief complaint of No chief complaint on file. , and found to have a diagnosis of <principal problem not specified>.  They were brought to the operating room on 12/21/2014 and underwent the above named procedures.    The patient had an uncomplicated hospital course and was stable for discharge.  Recent vital signs:  Filed Vitals:   12/22/14 0437  BP: 115/62  Pulse: 69  Temp: 97.8 F (36.6 C)  Resp: 16    Recent laboratory studies:  Results for orders placed or performed during the hospital encounter of 12/21/14  Anaerobic culture  Result Value Ref Range   Specimen Description WOUND RIGHT SHOULDER    Special Requests PATIENT ON FOLLOWING 2 GRAMS ANCEF    Gram Stain      RARE WBC PRESENT,BOTH PMN AND MONONUCLEAR NO SQUAMOUS EPITHELIAL CELLS SEEN NO ORGANISMS SEEN Performed at [facility]    Culture PENDING    Report Status PENDING   Wound culture  Result Value Ref Range   Specimen Description WOUND RIGHT SHOULDER    Special Requests PATIENT ON FOLLOWING 2 GRAMS ANCEF    Gram Stain      RARE WBC PRESENT,BOTH PMN AND MONONUCLEAR NO SQUAMOUS EPITHELIAL CELLS SEEN NO ORGANISMS SEEN Performed at [facility]    Culture PENDING    Report Status PENDING     Discharge Medications:     Medication List    STOP taking these medications        amoxicillin 500 MG capsule  Commonly known as:  AMOXIL      TAKE these medications        AMITIZA 24 MCG capsule    Generic drug:  lubiprostone  Take 24 mg by mouth 2 (two) times daily.     amLODipine 5 MG tablet  Commonly known as:  NORVASC  Take 1 tablet (5 mg total) by mouth daily.     carvedilol 25 MG tablet  Commonly known as:  COREG  Take 1 tablet (25 mg total) by mouth 2 (two) times daily.     cholecalciferol 1000 UNITS tablet  Commonly known as:  VITAMIN D  Take 1,000 Units by mouth daily.     CRANBERRY PO  Take 1 tablet by mouth daily.     DULoxetine 60 MG capsule  Commonly known as:  CYMBALTA  Take 60 mg by mouth daily.     esomeprazole 20 MG capsule  Commonly known as:  NEXIUM  Take 20 mg by mouth daily as needed (indigestion.).     losartan-hydrochlorothiazide 100-12.5 MG tablet  Commonly known as:  HYZAAR  Take 1 tablet by mouth every morning.     multivitamin tablet  Take 1 tablet by mouth every morning.     oxyCODONE-acetaminophen 5-325 MG tablet  Commonly known as:  PERCOCET/ROXICET  Take 1 tablet by mouth at bedtime as needed (pain).     oxyCODONE-acetaminophen 5-325 MG tablet  Commonly known as:  ROXICET  Take 1-2 tablets by mouth every 4 (four) hours as needed  for severe pain.     promethazine 25 MG tablet  Commonly known as:  PHENERGAN  Take 1 tab po at bedtime and then q 6 hrs as needed for nausea vomiting     promethazine 25 MG tablet  Commonly known as:  PHENERGAN  Take 1 tablet (25 mg total) by mouth every 6 (six) hours as needed for nausea or vomiting.     rOPINIRole 4 MG tablet  Commonly known as:  REQUIP  Take 4 mg by mouth at bedtime.     zolpidem 10 MG tablet  Commonly known as:  AMBIEN  Take 1 tablet by mouth daily as needed for sleep.        Diagnostic Studies: Dg Shoulder Right Port  12/22/2014  CLINICAL DATA:  Status post right shoulder arthroplasty. EXAM: PORTABLE RIGHT SHOULDER - 1 VIEW COMPARISON:  06/29/2014 FINDINGS: Reverse shoulder arthropathic components appear well seated and aligned. There is no acute fracture or evidence of  an operative complication. IMPRESSION: Well-aligned right shoulder prosthesis. Electronically Signed   By: Lajean Manes M.D.   On: 12-22-14 18:43    Disposition: 01-Home or Self Care        Follow-up Information    Follow up with NORRIS,STEVEN R, MD. Call in 2 weeks.   Specialty:  Orthopedic Surgery   Why:  [phone]   Contact information:   [address] [place] [address] [id number]        Signed: [name] 12/22/2014, 7:21 AM

## 2014-12-24 ENCOUNTER — Encounter (HOSPITAL_COMMUNITY): Payer: Self-pay | Admitting: Orthopedic Surgery

## 2014-12-24 LAB — WOUND CULTURE: Culture: NO GROWTH

## 2014-12-26 LAB — ANAEROBIC CULTURE

## 2015-03-19 ENCOUNTER — Other Ambulatory Visit: Payer: Self-pay | Admitting: Orthopedic Surgery

## 2015-03-19 DIAGNOSIS — M25512 Pain in left shoulder: Secondary | ICD-10-CM

## 2015-03-19 DIAGNOSIS — Z471 Aftercare following joint replacement surgery: Secondary | ICD-10-CM

## 2015-03-19 DIAGNOSIS — Z96612 Presence of left artificial shoulder joint: Secondary | ICD-10-CM

## 2015-03-20 ENCOUNTER — Ambulatory Visit
Admission: RE | Admit: 2015-03-20 | Discharge: 2015-03-20 | Disposition: A | Discharge status: Home / Self Care | Payer: BC Managed Care – PPO | Source: Ambulatory Visit | Attending: Orthopedic Surgery | Admitting: Orthopedic Surgery

## 2015-03-20 DIAGNOSIS — Z471 Aftercare following joint replacement surgery: Secondary | ICD-10-CM

## 2015-03-20 DIAGNOSIS — M25512 Pain in left shoulder: Secondary | ICD-10-CM

## 2015-03-20 DIAGNOSIS — Z96612 Presence of left artificial shoulder joint: Secondary | ICD-10-CM

## 2015-05-02 NOTE — H&P (Signed)
Connie Haynes is an 60 y.o. female.    Chief Complaint: left shoulder pain  HPI: Pt is a 60 y.o. female complaining of left shoulder pain for multiple years. Pain had continually increased since the beginning. X-rays in the clinic show loose glenoid s/p left total shoulder arthroplasty. Pt has tried various conservative treatments which have failed to alleviate their symptoms, including injections and therapy. Various options are discussed with the patient. Risks, benefits and expectations were discussed with the patient. Patient understand the risks, benefits and expectations and wishes to proceed with surgery.   PCP:  Ival Bible, MD  D/C Plans: Home  PMH: Past Medical History  Diagnosis Date  . Wolff-Parkinson-White (WPW) syndrome   . Hypertension   . Hyperlipidemia   . IBS (irritable bowel syndrome)     controlled with med  . PONV (postoperative nausea and vomiting)     " woke up to soon"  . Cough     hx. dry cough chronic  . Disorder of tear duct system     frequent tearing of eyes  . Osteoarthritis     arthritis -joints-limited ROM shoulders more on right  . Anxiety   . History of kidney stones   . Basal cell carcinoma     eye lid  . H/O: whooping cough 2000  . Sleep apnea     no longer using cpap  . History of kidney infection 2015    ? sepsis  . Anemia     PSH: Past Surgical History  Procedure Laterality Date  . Tear duct surgery    . Cervical spine surgery      cervical fusion with hardware retained  . Total abdominal hysterectomy    . Total hip arthroplasty      x 3(rt x2, lt x1)  . Ablation surgery      1993 [facility] (Dr. Hulen Skains)  . Eyelid carcinoma excision    . Joint replacement    . Total shoulder arthroplasty Left 07/15/2012    Procedure: LEFT TOTAL SHOULDER ARTHROPLASTY;  Surgeon: Augustin Schooling, MD;  Location: [facility];  Service: Orthopedics;  Laterality: Left;  . Cardiac electrophysiology study and ablation      '93 for WPW syndrome  . Total  knee arthroplasty Right 08/08/2013    Procedure: RIGHT TOTAL KNEE ARTHROPLASTY WITH SAPHENOUS NERVE SCAR EXCISION;  Surgeon: Mauri Pole, MD;  Location: [facility];  Service: Orthopedics;  Laterality: Right;  . Ureteroscopy  stent placement  . Cystoscopy/retrograde/ureteroscopy Left 12/18/2013    Procedure: CYSTOSCOPY/RETROGRADE/LEFT STENT;  Surgeon: Malka So, MD;  Location: [facility];  Service: Urology;  Laterality: Left;  . Esophagogastroduodenoscopy (egd) with propofol N/A 05/10/2014    Procedure: ESOPHAGOGASTRODUODENOSCOPY (EGD) WITH PROPOFOL;  Surgeon: Juanita Craver, MD;  Location: [facility];  Service: Endoscopy;  Laterality: N/A;  . Colonoscopy with propofol N/A 05/10/2014    Procedure: COLONOSCOPY WITH PROPOFOL;  Surgeon: Juanita Craver, MD;  Location: [facility];  Service: Endoscopy;  Laterality: N/A;  . Colon surgery      "colon detached from abdomen"  . Reverse shoulder arthroplasty Right 06/29/2014    Procedure: REVERSE TOTAL SHOULDER ARTHROPLASTY;  Surgeon: Netta Cedars, MD;  Location: [facility];  Service: Orthopedics;  Laterality: Right;  . Appendectomy    . Total shoulder revision Right 12/21/2014    Procedure: RIGHT SHOULDER POLY EXCHANGE ;  Surgeon: Netta Cedars, MD;  Location: [facility];  Service: Orthopedics;  Laterality: Right;  . Carpal tunnel release Right 12/21/2014  Procedure: RIGHT CARPAL TUNNEL RELEASE ;  Surgeon: Netta Cedars, MD;  Location: [facility];  Service: Orthopedics;  Laterality: Right;    Social History:  reports that she has never smoked. She has never used smokeless tobacco. She reports that she does not drink alcohol or use illicit drugs.  Allergies:  Allergies  Allergen Reactions  . Codeine Nausea And Vomiting  . Hydrocodone Nausea Only    Medications: No current facility-administered medications for this encounter.   Current Outpatient Prescriptions  Medication Sig Dispense Refill  . AMITIZA 24 MCG capsule Take 24 mg by mouth 2 (two) times daily.    Marland Kitchen  amLODipine (NORVASC) 5 MG tablet Take 1 tablet (5 mg total) by mouth daily. 30 tablet 11  . carvedilol (COREG) 25 MG tablet Take 1 tablet (25 mg total) by mouth 2 (two) times daily. (Patient taking differently: Take 12.5 mg by mouth 2 (two) times daily. ) 60 tablet 11  . cholecalciferol (VITAMIN D) 1000 UNITS tablet Take 1,000 Units by mouth daily.    Marland Kitchen CRANBERRY PO Take 1 tablet by mouth daily.    . DULoxetine (CYMBALTA) 60 MG capsule Take 60 mg by mouth daily.    Marland Kitchen esomeprazole (NEXIUM) 20 MG capsule Take 20 mg by mouth daily as needed (indigestion.).    Marland Kitchen losartan-hydrochlorothiazide (HYZAAR) 100-12.5 MG per tablet Take 1 tablet by mouth every morning. 30 tablet 3  . Multiple Vitamin (MULTIVITAMIN) tablet Take 1 tablet by mouth every morning.     Marland Kitchen oxyCODONE-acetaminophen (PERCOCET/ROXICET) 5-325 MG per tablet Take 1 tablet by mouth at bedtime as needed (pain).     Marland Kitchen oxyCODONE-acetaminophen (ROXICET) 5-325 MG tablet Take 1-2 tablets by mouth every 4 (four) hours as needed for severe pain. 60 tablet 0  . promethazine (PHENERGAN) 25 MG tablet Take 1 tablet (25 mg total) by mouth every 6 (six) hours as needed for nausea or vomiting. 60 tablet 1  . rOPINIRole (REQUIP) 4 MG tablet Take 4 mg by mouth at bedtime.     Marland Kitchen zolpidem (AMBIEN) 10 MG tablet Take 1 tablet by mouth daily as needed for sleep.       No results found for this or any previous visit (from the past 48 hour(s)). No results found.  ROS: Pain with rom of the left upper extremity  Physical Exam:  Alert and oriented 60 y.o. female in no acute distress Cranial nerves 2-12 intact Cervical spine: full rom with no tenderness, nv intact distally Chest: active breath sounds bilaterally, no wheeze rhonchi or rales Heart: regular rate and rhythm, no murmur Abd: non tender non distended with active bowel sounds Hip is stable with rom  Left shoulder painful rom nv intact distally No rashes or edema  Assessment/Plan Assessment: left  shoulder painful arthroplasty  Plan: Patient will undergo a left shoulder revision to reverse total shoulder by Dr. Veverly Fells at [facility]. Risks benefits and expectations were discussed with the patient. Patient understand risks, benefits and expectations and wishes to proceed.

## 2015-05-06 NOTE — Pre-Procedure Instructions (Addendum)
Connie Haynes  05/06/2015      [facility] - [place], Connie Haynes - [address] [place] [address] Phone: [phone] Fax: [phone]    Your procedure is scheduled on Wed, April 5  Report to [name] Admitting at 11:45 AM  Call this number if you have problems the morning of surgery:  [phone]   Remember:  Do not eat food or drink liquids after midnight.  Take these medicines the morning of surgery with A SIP OF WATER ,Amlodipine(Norvasc),Carvedilol(Coreg),Nexium(Esomeprazole),Pain Pill(if needed),and Phenergan(Promethazine-if needed)              No Goody's,BC's,Aleve,Aspirin,Ibuprofen,Motrin,Advil,Fish Oil,celebrex or any Herbal Medications.including  Vitamins(vit D, cranberry,multi)05/10/15   Do not wear jewelry, make-up or nail polish.  Do not wear lotions, powders, or perfumes.  You may wear deodorant.  Do not shave 48 hours prior to surgery.    Do not bring valuables to the hospital.  [facility] is not responsible for any belongings or valuables.  Contacts, dentures or bridgework may not be worn into surgery.  Leave your suitcase in the car.  After surgery it may be brought to your room.  For patients admitted to the hospital, discharge time will be determined by your treatment team.  Patients discharged the day of surgery will not be allowed to drive home.    Special instructions:  Connie Haynes - Preparing for Surgery  Before surgery, you can play an important role.  Because skin is not sterile, your skin needs to be as free of germs as possible.  You can reduce the number of germs on you skin by washing with CHG (chlorahexidine gluconate) soap before surgery.  CHG is an antiseptic cleaner which kills germs and bonds with the skin to continue killing germs even after washing.  Please DO NOT use if you have an allergy to CHG or antibacterial soaps.  If your skin becomes reddened/irritated stop using the CHG  and inform your nurse when you arrive at Short Stay.  Do not shave (including legs and underarms) for at least 48 hours prior to the first CHG shower.  You may shave your face.  Please follow these instructions carefully:   1.  Shower with CHG Soap the night before surgery and the                                morning of Surgery.  2.  If you choose to wash your hair, wash your hair first as usual with your       normal shampoo.  3.  After you shampoo, rinse your hair and body thoroughly to remove the                      Shampoo.  4.  Use CHG as you would any other liquid soap.  You can apply chg directly       to the skin and wash gently with scrungie or a clean washcloth.  5.  Apply the CHG Soap to your body ONLY FROM THE NECK DOWN.        Do not use on open wounds or open sores.  Avoid contact with your eyes,       ears, mouth and genitals (private parts).  Wash genitals (private parts)       with your normal soap.  6.  Wash thoroughly, paying  special attention to the area where your surgery        will be performed.  7.  Thoroughly rinse your body with warm water from the neck down.  8.  DO NOT shower/wash with your normal soap after using and rinsing off       the CHG Soap.  9.  Pat yourself dry with a clean towel.            10.  Wear clean pajamas.            11.  Place clean sheets on your bed the night of your first shower and do not        sleep with pets.  Day of Surgery  Do not apply any lotions/deoderants the morning of surgery.  Please wear clean clothes to the hospital/surgery center.    Please read over the following fact sheets that you were given. Pain Booklet, Coughing and Deep Breathing, Blood Transfusion Information, MRSA Information and Surgical Site Infection Prevention

## 2015-05-07 ENCOUNTER — Encounter (HOSPITAL_COMMUNITY): Payer: Self-pay

## 2015-05-07 ENCOUNTER — Encounter (HOSPITAL_COMMUNITY)
Admission: RE | Admit: 2015-05-07 | Discharge: 2015-05-07 | Disposition: A | Discharge status: Home / Self Care | Payer: BC Managed Care – PPO | Source: Ambulatory Visit | Attending: Orthopedic Surgery | Admitting: Orthopedic Surgery

## 2015-05-07 DIAGNOSIS — Z0183 Encounter for blood typing: Secondary | ICD-10-CM | POA: Diagnosis not present

## 2015-05-07 DIAGNOSIS — Z01812 Encounter for preprocedural laboratory examination: Secondary | ICD-10-CM | POA: Diagnosis not present

## 2015-05-07 DIAGNOSIS — I10 Essential (primary) hypertension: Secondary | ICD-10-CM | POA: Insufficient documentation

## 2015-05-07 DIAGNOSIS — K219 Gastro-esophageal reflux disease without esophagitis: Secondary | ICD-10-CM | POA: Insufficient documentation

## 2015-05-07 DIAGNOSIS — Z01818 Encounter for other preprocedural examination: Secondary | ICD-10-CM | POA: Diagnosis present

## 2015-05-07 DIAGNOSIS — Z96651 Presence of right artificial knee joint: Secondary | ICD-10-CM | POA: Diagnosis not present

## 2015-05-07 DIAGNOSIS — Z79899 Other long term (current) drug therapy: Secondary | ICD-10-CM | POA: Insufficient documentation

## 2015-05-07 DIAGNOSIS — K589 Irritable bowel syndrome without diarrhea: Secondary | ICD-10-CM | POA: Insufficient documentation

## 2015-05-07 DIAGNOSIS — Z96643 Presence of artificial hip joint, bilateral: Secondary | ICD-10-CM | POA: Insufficient documentation

## 2015-05-07 DIAGNOSIS — G4733 Obstructive sleep apnea (adult) (pediatric): Secondary | ICD-10-CM | POA: Diagnosis not present

## 2015-05-07 DIAGNOSIS — E785 Hyperlipidemia, unspecified: Secondary | ICD-10-CM | POA: Insufficient documentation

## 2015-05-07 HISTORY — DX: Urinary tract infection, site not specified: N39.0

## 2015-05-07 HISTORY — DX: Gastro-esophageal reflux disease without esophagitis: K21.9

## 2015-05-07 LAB — CBC
HCT: 38.3 % (ref 36.0–46.0)
Hemoglobin: 12.4 g/dL (ref 12.0–15.0)
MCH: 28.5 pg (ref 26.0–34.0)
MCHC: 32.4 g/dL (ref 30.0–36.0)
MCV: 88 fL (ref 78.0–100.0)
Platelets: 233 10*3/uL (ref 150–400)
RBC: 4.35 MIL/uL (ref 3.87–5.11)
RDW: 13.8 % (ref 11.5–15.5)
WBC: 5.5 10*3/uL (ref 4.0–10.5)

## 2015-05-07 LAB — BASIC METABOLIC PANEL
Anion gap: 11 (ref 5–15)
BUN: 16 mg/dL (ref 6–20)
CO2: 20 mmol/L — ABNORMAL LOW (ref 22–32)
Calcium: 8.8 mg/dL — ABNORMAL LOW (ref 8.9–10.3)
Chloride: 105 mmol/L (ref 101–111)
Creatinine, Ser: 1.2 mg/dL — ABNORMAL HIGH (ref 0.44–1.00)
GFR calc Af Amer: 55 mL/min — ABNORMAL LOW (ref >60)
GFR calc non Af Amer: 47 mL/min — ABNORMAL LOW (ref >60)
Glucose, Bld: 94 mg/dL (ref 65–99)
Potassium: 4 mmol/L (ref 3.5–5.1)
Sodium: 136 mmol/L (ref 135–145)

## 2015-05-07 LAB — TYPE AND SCREEN
ABO/RH(D): O POS
Antibody Screen: NEGATIVE

## 2015-05-07 LAB — PROTIME-INR
INR: 0.96 (ref 0.00–1.49)
Prothrombin Time: 13 seconds (ref 11.6–15.2)

## 2015-05-07 LAB — SURGICAL PCR SCREEN
MRSA, PCR: NEGATIVE
Staphylococcus aureus: NEGATIVE

## 2015-05-07 NOTE — Progress Notes (Signed)
Patient bp low at preadmit . Re taken and was a little higher. Patient stated usually higher  In 120's. No symptoms. inst her to call her pcp. And if had any symptoms

## 2015-05-08 NOTE — Progress Notes (Signed)
Anesthesia Chart Review: Patient is a 60 year old female scheduled for left shoulder revision total shoulder arthroplasty to reverse total shoulder arthroplasty on 05/15/15 by Dr. Veverly Fells. Anesthesia type is posted for Choice with interscalene block.  History includes non-smoker, WPW s/p ablation at [facility] (Dr. Hulen Skains) in 1993, HTN, HLD, IBS, OA, post-operative N/V, cyclical cough without true asthma and felt related to post-nasal drip and GERD, OSA with CPAP, anxiety, skin cancer, s/p bilateral THA, c-spine surgery, colon surgery, right TKA '15, left total shoulder '14, nephrolithiasis s/p left ureteral stent '15, right reverse total shoulder 06/29/14, right shoulder poly exchange 12/21/14. BMI is consistent with morbid obesity.   PCP Dr. Ival Bible [facility], see Care Everywhere). Pulmonologist is with [facility], last visit with Rexene Edison, NP/Dr. Elsworth Soho. Cardiologist is Dr. Caryl Comes, last visit 06/14/14.   Meds include Amitiza, amlodipine, carvedilol, celecoxib, esomeprazole, losartan-HCTZ, promethazine, ropinirole.  06/14/14 EKG: SB at 58 bpm, septal infarct (age undetermined).  10/04/13 Nuclear stress test: Overall Impression: Low risk stress nuclear study . There is mild attenuation of the inferior wall that is most likely due to uptake in the adjacent structures. LV Ejection Fraction: 63%. LV Wall Motion: NL LV Function; NL Wall Motion.  06/04/11 Spirometry: FVC 2.75 (84%), FEV1 2.15 (83%), FEF25-75% 2.08 (81%). Normal spirometry.  Preoperatively labs noted.   If no acute changes then I anticipate that she can proceed as planned.  George Hugh [facility] Short Stay Center/Anesthesiology Phone [phone] 05/08/2015 12:10 PM

## 2015-05-14 MED ORDER — CEFAZOLIN SODIUM-DEXTROSE 2-4 GM/100ML-% IV SOLN
2.0000 g | INTRAVENOUS | Status: AC
  Administered 2015-05-15: 2 g via INTRAVENOUS
  Filled 2015-05-14: qty 100

## 2015-05-15 ENCOUNTER — Inpatient Hospital Stay (HOSPITAL_COMMUNITY)
Admission: RE | Admit: 2015-05-15 | Discharge: 2015-05-17 | DRG: 483 | Disposition: A | Discharge status: Home / Self Care | Payer: BC Managed Care – PPO | Source: Ambulatory Visit | Attending: Orthopedic Surgery | Admitting: Orthopedic Surgery

## 2015-05-15 ENCOUNTER — Inpatient Hospital Stay (HOSPITAL_COMMUNITY): Payer: BC Managed Care – PPO | Admitting: Emergency Medicine

## 2015-05-15 ENCOUNTER — Inpatient Hospital Stay (HOSPITAL_COMMUNITY): Payer: BC Managed Care – PPO | Admitting: Anesthesiology

## 2015-05-15 ENCOUNTER — Encounter (HOSPITAL_COMMUNITY)
Admission: RE | Disposition: A | Discharge status: Home / Self Care | Payer: Self-pay | Source: Ambulatory Visit | Attending: Orthopedic Surgery

## 2015-05-15 ENCOUNTER — Encounter (HOSPITAL_COMMUNITY): Payer: Self-pay | Admitting: *Deleted

## 2015-05-15 ENCOUNTER — Inpatient Hospital Stay (HOSPITAL_COMMUNITY): Payer: BC Managed Care – PPO

## 2015-05-15 DIAGNOSIS — Z85828 Personal history of other malignant neoplasm of skin: Secondary | ICD-10-CM | POA: Diagnosis not present

## 2015-05-15 DIAGNOSIS — E785 Hyperlipidemia, unspecified: Secondary | ICD-10-CM | POA: Diagnosis present

## 2015-05-15 DIAGNOSIS — Z96619 Presence of unspecified artificial shoulder joint: Secondary | ICD-10-CM

## 2015-05-15 DIAGNOSIS — K589 Irritable bowel syndrome without diarrhea: Secondary | ICD-10-CM | POA: Diagnosis present

## 2015-05-15 DIAGNOSIS — Z79899 Other long term (current) drug therapy: Secondary | ICD-10-CM | POA: Diagnosis not present

## 2015-05-15 DIAGNOSIS — F419 Anxiety disorder, unspecified: Secondary | ICD-10-CM | POA: Diagnosis present

## 2015-05-15 DIAGNOSIS — M25512 Pain in left shoulder: Secondary | ICD-10-CM | POA: Diagnosis present

## 2015-05-15 DIAGNOSIS — G4730 Sleep apnea, unspecified: Secondary | ICD-10-CM | POA: Diagnosis present

## 2015-05-15 DIAGNOSIS — Z96612 Presence of left artificial shoulder joint: Secondary | ICD-10-CM

## 2015-05-15 DIAGNOSIS — I456 Pre-excitation syndrome: Secondary | ICD-10-CM | POA: Diagnosis present

## 2015-05-15 DIAGNOSIS — T84098A Other mechanical complication of other internal joint prosthesis, initial encounter: Secondary | ICD-10-CM | POA: Diagnosis present

## 2015-05-15 DIAGNOSIS — I10 Essential (primary) hypertension: Secondary | ICD-10-CM | POA: Diagnosis present

## 2015-05-15 DIAGNOSIS — Z96643 Presence of artificial hip joint, bilateral: Secondary | ICD-10-CM | POA: Diagnosis present

## 2015-05-15 DIAGNOSIS — Y838 Other surgical procedures as the cause of abnormal reaction of the patient, or of later complication, without mention of misadventure at the time of the procedure: Secondary | ICD-10-CM | POA: Diagnosis present

## 2015-05-15 HISTORY — PX: REVISION TOTAL SHOULDER TO REVERSE TOTAL SHOULDER: SHX6313

## 2015-05-15 SURGERY — REVISION, REVERSE TOTAL ARTHROPLASTY, SHOULDER
Anesthesia: General | Site: Shoulder | Laterality: Left

## 2015-05-15 MED ORDER — LACTATED RINGERS IV SOLN
INTRAVENOUS | Status: DC
  Administered 2015-05-15 (×3): via INTRAVENOUS

## 2015-05-15 MED ORDER — FENTANYL CITRATE (PF) 100 MCG/2ML IJ SOLN
50.0000 ug | INTRAMUSCULAR | Status: DC | PRN
Start: 2015-05-15 — End: 2015-05-15
  Administered 2015-05-15 (×2): 50 ug via INTRAVENOUS

## 2015-05-15 MED ORDER — PROPOFOL 10 MG/ML IV BOLUS
INTRAVENOUS | Status: DC | PRN
  Administered 2015-05-15: 30 mg via INTRAVENOUS
  Administered 2015-05-15: 140 mg via INTRAVENOUS

## 2015-05-15 MED ORDER — ONDANSETRON HCL 4 MG/2ML IJ SOLN
4.0000 mg | Freq: Four times a day (QID) | INTRAMUSCULAR | Status: DC | PRN
  Administered 2015-05-16 (×3): 4 mg via INTRAVENOUS
  Filled 2015-05-15 (×4): qty 2

## 2015-05-15 MED ORDER — FENTANYL CITRATE (PF) 250 MCG/5ML IJ SOLN
INTRAMUSCULAR | Status: AC
  Filled 2015-05-15: qty 5

## 2015-05-15 MED ORDER — METOCLOPRAMIDE HCL 5 MG PO TABS
5.0000 mg | ORAL_TABLET | Freq: Three times a day (TID) | ORAL | Status: DC | PRN
  Administered 2015-05-16: 10 mg via ORAL
  Filled 2015-05-15: qty 2

## 2015-05-15 MED ORDER — MENTHOL 3 MG MT LOZG: 1.0000 | LOZENGE | OROMUCOSAL | Status: DC | PRN

## 2015-05-15 MED ORDER — MIDAZOLAM HCL 2 MG/2ML IJ SOLN
INTRAMUSCULAR | Status: AC
  Filled 2015-05-15: qty 2

## 2015-05-15 MED ORDER — FENTANYL CITRATE (PF) 100 MCG/2ML IJ SOLN
25.0000 ug | INTRAMUSCULAR | Status: DC | PRN
  Administered 2015-05-15 (×2): 50 ug via INTRAVENOUS

## 2015-05-15 MED ORDER — OXYCODONE-ACETAMINOPHEN 5-325 MG PO TABS
ORAL_TABLET | ORAL | Status: AC
  Administered 2015-05-15: 2 via ORAL
  Filled 2015-05-15: qty 2

## 2015-05-15 MED ORDER — ONDANSETRON HCL 4 MG/2ML IJ SOLN
INTRAMUSCULAR | Status: DC | PRN
  Administered 2015-05-15: 4 mg via INTRAVENOUS

## 2015-05-15 MED ORDER — DOCUSATE SODIUM 100 MG PO CAPS
100.0000 mg | ORAL_CAPSULE | Freq: Two times a day (BID) | ORAL | Status: DC
  Administered 2015-05-15 – 2015-05-17 (×4): 100 mg via ORAL
  Filled 2015-05-15 (×4): qty 1

## 2015-05-15 MED ORDER — FENTANYL CITRATE (PF) 100 MCG/2ML IJ SOLN
INTRAMUSCULAR | Status: AC
  Administered 2015-05-15: 50 ug via INTRAVENOUS
  Filled 2015-05-15: qty 2

## 2015-05-15 MED ORDER — ROPINIROLE HCL 1 MG PO TABS
4.0000 mg | ORAL_TABLET | Freq: Every day | ORAL | Status: DC
  Administered 2015-05-15 – 2015-05-16 (×2): 4 mg via ORAL
  Filled 2015-05-15 (×2): qty 4

## 2015-05-15 MED ORDER — LUBIPROSTONE 24 MCG PO CAPS
24.0000 ug | ORAL_CAPSULE | Freq: Two times a day (BID) | ORAL | Status: DC
  Administered 2015-05-15 – 2015-05-17 (×4): 24 ug via ORAL
  Filled 2015-05-15 (×4): qty 1

## 2015-05-15 MED ORDER — BUPIVACAINE-EPINEPHRINE (PF) 0.5% -1:200000 IJ SOLN
INTRAMUSCULAR | Status: DC | PRN
  Administered 2015-05-15: 15 mL via PERINEURAL

## 2015-05-15 MED ORDER — PROMETHAZINE HCL 25 MG PO TABS
25.0000 mg | ORAL_TABLET | Freq: Four times a day (QID) | ORAL | Status: DC | PRN
  Administered 2015-05-15 – 2015-05-16 (×2): 25 mg via ORAL
  Filled 2015-05-15 (×2): qty 1

## 2015-05-15 MED ORDER — PROPOFOL 10 MG/ML IV BOLUS
INTRAVENOUS | Status: AC
  Filled 2015-05-15: qty 20

## 2015-05-15 MED ORDER — SODIUM CHLORIDE 0.9 % IV SOLN
INTRAVENOUS | Status: DC
  Administered 2015-05-15 – 2015-05-16 (×2): via INTRAVENOUS

## 2015-05-15 MED ORDER — LIDOCAINE HCL (CARDIAC) 20 MG/ML IV SOLN
INTRAVENOUS | Status: DC | PRN
  Administered 2015-05-15: 60 mg via INTRAVENOUS

## 2015-05-15 MED ORDER — 0.9 % SODIUM CHLORIDE (POUR BTL) OPTIME
TOPICAL | Status: DC | PRN
  Administered 2015-05-15: 1000 mL

## 2015-05-15 MED ORDER — CELECOXIB 200 MG PO CAPS
200.0000 mg | ORAL_CAPSULE | Freq: Two times a day (BID) | ORAL | Status: DC
  Administered 2015-05-15 – 2015-05-17 (×4): 200 mg via ORAL
  Filled 2015-05-15 (×4): qty 1

## 2015-05-15 MED ORDER — ROCURONIUM BROMIDE 50 MG/5ML IV SOLN
INTRAVENOUS | Status: AC
  Filled 2015-05-15: qty 1

## 2015-05-15 MED ORDER — POLYETHYLENE GLYCOL 3350 17 G PO PACK: 17.0000 g | PACK | Freq: Every day | ORAL | Status: DC | PRN

## 2015-05-15 MED ORDER — BUPIVACAINE-EPINEPHRINE (PF) 0.25% -1:200000 IJ SOLN
INTRAMUSCULAR | Status: AC
  Filled 2015-05-15: qty 30

## 2015-05-15 MED ORDER — TOBRAMYCIN SULFATE 1.2 G IJ SOLR
INTRAMUSCULAR | Status: DC | PRN
  Administered 2015-05-15: 1.2 g

## 2015-05-15 MED ORDER — HYDROCHLOROTHIAZIDE 12.5 MG PO CAPS
12.5000 mg | ORAL_CAPSULE | Freq: Every day | ORAL | Status: DC
  Administered 2015-05-16: 12.5 mg via ORAL
  Filled 2015-05-15 (×2): qty 1

## 2015-05-15 MED ORDER — CARVEDILOL 12.5 MG PO TABS
12.5000 mg | ORAL_TABLET | Freq: Two times a day (BID) | ORAL | Status: DC
  Administered 2015-05-15 – 2015-05-16 (×2): 12.5 mg via ORAL
  Filled 2015-05-15: qty 1

## 2015-05-15 MED ORDER — HYDROMORPHONE HCL 1 MG/ML IJ SOLN
1.0000 mg | INTRAMUSCULAR | Status: DC | PRN
  Administered 2015-05-16 (×2): 1 mg via INTRAVENOUS
  Filled 2015-05-15 (×2): qty 1

## 2015-05-15 MED ORDER — CARVEDILOL 12.5 MG PO TABS: 12.5000 mg | ORAL_TABLET | Freq: Two times a day (BID) | ORAL | Status: DC

## 2015-05-15 MED ORDER — PHENOL 1.4 % MT LIQD
1.0000 | OROMUCOSAL | Status: DC | PRN
Start: 2015-05-15 — End: 2015-05-17

## 2015-05-15 MED ORDER — METAXALONE 800 MG PO TABS: 800.0000 mg | ORAL_TABLET | Freq: Four times a day (QID) | ORAL | Status: DC | PRN

## 2015-05-15 MED ORDER — SCOPOLAMINE 1 MG/3DAYS TD PT72
MEDICATED_PATCH | TRANSDERMAL | Status: AC
Start: 2015-05-15 — End: 2015-05-15
  Administered 2015-05-15: 1 via TRANSDERMAL
  Filled 2015-05-15: qty 1

## 2015-05-15 MED ORDER — PHENYLEPHRINE HCL 10 MG/ML IJ SOLN
10.0000 mg | INTRAVENOUS | Status: DC | PRN
  Administered 2015-05-15: 30 ug/min via INTRAVENOUS

## 2015-05-15 MED ORDER — LIDOCAINE HCL (CARDIAC) 20 MG/ML IV SOLN
INTRAVENOUS | Status: AC
  Filled 2015-05-15: qty 5

## 2015-05-15 MED ORDER — VANCOMYCIN HCL 1000 MG IV SOLR
INTRAVENOUS | Status: AC
  Filled 2015-05-15: qty 1000

## 2015-05-15 MED ORDER — EPHEDRINE SULFATE 50 MG/ML IJ SOLN
INTRAMUSCULAR | Status: DC | PRN
  Administered 2015-05-15: 5 mg via INTRAVENOUS
  Administered 2015-05-15: 10 mg via INTRAVENOUS

## 2015-05-15 MED ORDER — PANTOPRAZOLE SODIUM 40 MG PO TBEC
40.0000 mg | DELAYED_RELEASE_TABLET | Freq: Every day | ORAL | Status: DC
  Administered 2015-05-16 – 2015-05-17 (×2): 40 mg via ORAL
  Filled 2015-05-15 (×2): qty 1

## 2015-05-15 MED ORDER — VANCOMYCIN HCL 1000 MG IV SOLR
INTRAVENOUS | Status: DC | PRN
  Administered 2015-05-15: 1000 mg

## 2015-05-15 MED ORDER — LACTATED RINGERS IV SOLN: INTRAVENOUS | Status: DC

## 2015-05-15 MED ORDER — CEFAZOLIN SODIUM-DEXTROSE 2-4 GM/100ML-% IV SOLN
2.0000 g | Freq: Four times a day (QID) | INTRAVENOUS | Status: AC
  Administered 2015-05-15 – 2015-05-16 (×3): 2 g via INTRAVENOUS
  Filled 2015-05-15 (×3): qty 100

## 2015-05-15 MED ORDER — ONDANSETRON HCL 4 MG PO TABS: 4.0000 mg | ORAL_TABLET | Freq: Four times a day (QID) | ORAL | Status: DC | PRN

## 2015-05-15 MED ORDER — ACETAMINOPHEN 650 MG RE SUPP: 650.0000 mg | Freq: Four times a day (QID) | RECTAL | Status: DC | PRN

## 2015-05-15 MED ORDER — LOSARTAN POTASSIUM-HCTZ 100-12.5 MG PO TABS: 1.0000 | ORAL_TABLET | Freq: Every morning | ORAL | Status: DC

## 2015-05-15 MED ORDER — BUPIVACAINE-EPINEPHRINE 0.25% -1:200000 IJ SOLN
INTRAMUSCULAR | Status: DC | PRN
  Administered 2015-05-15: 10 mL

## 2015-05-15 MED ORDER — CHLORHEXIDINE GLUCONATE 4 % EX LIQD: 60.0000 mL | Freq: Once | CUTANEOUS | Status: AC

## 2015-05-15 MED ORDER — ACETAMINOPHEN 325 MG PO TABS: 650.0000 mg | ORAL_TABLET | Freq: Four times a day (QID) | ORAL | Status: DC | PRN

## 2015-05-15 MED ORDER — PROMETHAZINE HCL 25 MG PO TABS: 25.0000 mg | ORAL_TABLET | Freq: Four times a day (QID) | ORAL | Status: DC | PRN

## 2015-05-15 MED ORDER — ADULT MULTIVITAMIN W/MINERALS CH
1.0000 | ORAL_TABLET | Freq: Every morning | ORAL | Status: DC
  Administered 2015-05-16 – 2015-05-17 (×2): 1 via ORAL
  Filled 2015-05-15 (×3): qty 1

## 2015-05-15 MED ORDER — AMLODIPINE BESYLATE 5 MG PO TABS
5.0000 mg | ORAL_TABLET | Freq: Every day | ORAL | Status: DC
  Administered 2015-05-16: 5 mg via ORAL
  Filled 2015-05-15 (×2): qty 1

## 2015-05-15 MED ORDER — VITAMIN D 1000 UNITS PO TABS
1000.0000 [IU] | ORAL_TABLET | Freq: Every day | ORAL | Status: DC
  Administered 2015-05-16 – 2015-05-17 (×2): 1000 [IU] via ORAL
  Filled 2015-05-15 (×2): qty 1

## 2015-05-15 MED ORDER — LOSARTAN POTASSIUM 50 MG PO TABS
100.0000 mg | ORAL_TABLET | Freq: Every day | ORAL | Status: DC
  Administered 2015-05-16: 100 mg via ORAL
  Filled 2015-05-15 (×2): qty 2

## 2015-05-15 MED ORDER — METAXALONE 800 MG PO TABS
800.0000 mg | ORAL_TABLET | Freq: Four times a day (QID) | ORAL | Status: DC
  Administered 2015-05-15 – 2015-05-17 (×6): 800 mg via ORAL
  Filled 2015-05-15 (×7): qty 1

## 2015-05-15 MED ORDER — FENTANYL CITRATE (PF) 100 MCG/2ML IJ SOLN
INTRAMUSCULAR | Status: AC
  Filled 2015-05-15: qty 2

## 2015-05-15 MED ORDER — TOBRAMYCIN SULFATE 1.2 G IJ SOLR
INTRAMUSCULAR | Status: AC
  Filled 2015-05-15: qty 1.2

## 2015-05-15 MED ORDER — MIDAZOLAM HCL 2 MG/2ML IJ SOLN
INTRAMUSCULAR | Status: AC
  Administered 2015-05-15: 2 mg via INTRAVENOUS
  Filled 2015-05-15: qty 2

## 2015-05-15 MED ORDER — CRANBERRY 250 MG PO TABS: ORAL_TABLET | Freq: Every day | ORAL | Status: DC

## 2015-05-15 MED ORDER — CARVEDILOL 25 MG PO TABS
25.0000 mg | ORAL_TABLET | Freq: Two times a day (BID) | ORAL | Status: DC
  Filled 2015-05-15: qty 1

## 2015-05-15 MED ORDER — MEPERIDINE HCL 25 MG/ML IJ SOLN: 6.2500 mg | INTRAMUSCULAR | Status: DC | PRN

## 2015-05-15 MED ORDER — OXYCODONE-ACETAMINOPHEN 5-325 MG PO TABS: 1.0000 | ORAL_TABLET | ORAL | Status: DC | PRN

## 2015-05-15 MED ORDER — DEXAMETHASONE SODIUM PHOSPHATE 4 MG/ML IJ SOLN
INTRAMUSCULAR | Status: DC | PRN
  Administered 2015-05-15: 4 mg via INTRAVENOUS

## 2015-05-15 MED ORDER — MIDAZOLAM HCL 2 MG/2ML IJ SOLN
1.0000 mg | INTRAMUSCULAR | Status: DC | PRN
  Administered 2015-05-15: 2 mg via INTRAVENOUS

## 2015-05-15 MED ORDER — METOCLOPRAMIDE HCL 5 MG/ML IJ SOLN
5.0000 mg | Freq: Three times a day (TID) | INTRAMUSCULAR | Status: DC | PRN
  Administered 2015-05-16: 5 mg via INTRAVENOUS
  Filled 2015-05-15: qty 2

## 2015-05-15 MED ORDER — OXYCODONE-ACETAMINOPHEN 5-325 MG PO TABS
1.0000 | ORAL_TABLET | ORAL | Status: DC | PRN
  Administered 2015-05-15 – 2015-05-16 (×6): 2 via ORAL
  Filled 2015-05-15 (×5): qty 2

## 2015-05-15 MED ORDER — ROCURONIUM BROMIDE 100 MG/10ML IV SOLN
INTRAVENOUS | Status: DC | PRN
  Administered 2015-05-15: 40 mg via INTRAVENOUS

## 2015-05-15 SURGICAL SUPPLY — 72 items
BIT DRILL 170X2.5X (BIT) ×1 IMPLANT
BIT DRILL 5/64X5 DISP (BIT) ×2 IMPLANT
BIT DRL 170X2.5X (BIT) ×1
BLADE SAG 18X100X1.27 (BLADE) ×2 IMPLANT
BNDG COHESIVE 4X5 TAN STRL (GAUZE/BANDAGES/DRESSINGS) IMPLANT
BONE CANC CHIPS 20CC PCAN1/4 (Bone Implant) ×2 IMPLANT
BOWL SMART MIX CTS (DISPOSABLE) ×2 IMPLANT
CAPT SHLDR REVTOTAL 1 ×2 IMPLANT
CEMENT BONE DEPUY (Cement) ×2 IMPLANT
CHIPS CANC BONE 20CC PCAN1/4 (Bone Implant) ×1 IMPLANT
COVER SURGICAL LIGHT HANDLE (MISCELLANEOUS) ×2 IMPLANT
DRAPE IMP U-DRAPE 54X76 (DRAPES) ×4 IMPLANT
DRAPE INCISE IOBAN 66X45 STRL (DRAPES) ×2 IMPLANT
DRAPE ORTHO SPLIT 77X108 STRL (DRAPES) ×2
DRAPE SURG ORHT 6 SPLT 77X108 (DRAPES) ×2 IMPLANT
DRAPE U-SHAPE 47X51 STRL (DRAPES) ×2 IMPLANT
DRAPE X-RAY CASS 24X20 (DRAPES) IMPLANT
DRILL 2.5 (BIT) ×1
DRSG ADAPTIC 3X8 NADH LF (GAUZE/BANDAGES/DRESSINGS) ×2 IMPLANT
DRSG PAD ABDOMINAL 8X10 ST (GAUZE/BANDAGES/DRESSINGS) ×2 IMPLANT
DURAPREP 26ML APPLICATOR (WOUND CARE) ×2 IMPLANT
ELECT BLADE 4.0 EZ CLEAN MEGAD (MISCELLANEOUS) ×2
ELECT NEEDLE TIP 2.8 STRL (NEEDLE) ×2 IMPLANT
ELECT REM PT RETURN 9FT ADLT (ELECTROSURGICAL) ×2
ELECTRODE BLDE 4.0 EZ CLN MEGD (MISCELLANEOUS) ×1 IMPLANT
ELECTRODE REM PT RTRN 9FT ADLT (ELECTROSURGICAL) ×1 IMPLANT
GAUZE SPONGE 4X4 12PLY STRL (GAUZE/BANDAGES/DRESSINGS) ×2 IMPLANT
GLOVE BIOGEL PI ORTHO PRO 7.5 (GLOVE) ×1
GLOVE BIOGEL PI ORTHO PRO SZ8 (GLOVE) ×1
GLOVE ORTHO TXT STRL SZ7.5 (GLOVE) ×2 IMPLANT
GLOVE PI ORTHO PRO STRL 7.5 (GLOVE) ×1 IMPLANT
GLOVE PI ORTHO PRO STRL SZ8 (GLOVE) ×1 IMPLANT
GLOVE SURG ORTHO 8.5 STRL (GLOVE) ×2 IMPLANT
GOWN STRL REUS W/ TWL LRG LVL3 (GOWN DISPOSABLE) ×1 IMPLANT
GOWN STRL REUS W/ TWL XL LVL3 (GOWN DISPOSABLE) ×2 IMPLANT
GOWN STRL REUS W/TWL LRG LVL3 (GOWN DISPOSABLE) ×1
GOWN STRL REUS W/TWL XL LVL3 (GOWN DISPOSABLE) ×2
HANDPIECE INTERPULSE COAX TIP (DISPOSABLE) ×1
KIT BASIN OR (CUSTOM PROCEDURE TRAY) ×2 IMPLANT
KIT ROOM TURNOVER OR (KITS) ×2 IMPLANT
MANIFOLD NEPTUNE II (INSTRUMENTS) ×2 IMPLANT
NEEDLE 1/2 CIR MAYO (NEEDLE) ×2 IMPLANT
NEEDLE HYPO 25GX1X1/2 BEV (NEEDLE) ×2 IMPLANT
NS IRRIG 1000ML POUR BTL (IV SOLUTION) ×2 IMPLANT
PACK SHOULDER (CUSTOM PROCEDURE TRAY) ×2 IMPLANT
PAD ARMBOARD 7.5X6 YLW CONV (MISCELLANEOUS) ×4 IMPLANT
PIN GUIDE 1.2 (PIN) ×2 IMPLANT
PIN GUIDE GLENOPHERE 1.5MX300M (PIN) ×2 IMPLANT
PIN METAGLENE 2.5 (PIN) ×2 IMPLANT
SET HNDPC FAN SPRY TIP SCT (DISPOSABLE) ×1 IMPLANT
SLING ARM LRG ADULT FOAM STRAP (SOFTGOODS) ×2 IMPLANT
SLING ARM MED ADULT FOAM STRAP (SOFTGOODS) IMPLANT
SPONGE LAP 18X18 X RAY DECT (DISPOSABLE) IMPLANT
SPONGE LAP 4X18 X RAY DECT (DISPOSABLE) ×2 IMPLANT
STAPLER SKIN 35 WIDE (STAPLE) ×2 IMPLANT
STRIP CLOSURE SKIN 1/2X4 (GAUZE/BANDAGES/DRESSINGS) ×2 IMPLANT
SUCTION FRAZIER HANDLE 10FR (MISCELLANEOUS) ×1
SUCTION TUBE FRAZIER 10FR DISP (MISCELLANEOUS) ×1 IMPLANT
SUT FIBERWIRE #2 38 T-5 BLUE (SUTURE) ×4
SUT MNCRL AB 4-0 PS2 18 (SUTURE) ×2 IMPLANT
SUT VIC AB 2-0 CT1 27 (SUTURE) ×2
SUT VIC AB 2-0 CT1 TAPERPNT 27 (SUTURE) ×2 IMPLANT
SUT VICRYL 0 CT 1 36IN (SUTURE) ×2 IMPLANT
SUTURE FIBERWR #2 38 T-5 BLUE (SUTURE) ×2 IMPLANT
SYR CONTROL 10ML LL (SYRINGE) ×2 IMPLANT
TAPE CLOTH SURG 6X10 WHT LF (GAUZE/BANDAGES/DRESSINGS) ×2 IMPLANT
TOWEL OR 17X24 6PK STRL BLUE (TOWEL DISPOSABLE) ×2 IMPLANT
TOWEL OR 17X26 10 PK STRL BLUE (TOWEL DISPOSABLE) ×2 IMPLANT
TOWER CARTRIDGE SMART MIX (DISPOSABLE) IMPLANT
TRAY FOLEY CATH 16FRSI W/METER (SET/KITS/TRAYS/PACK) ×2 IMPLANT
WATER STERILE IRR 1000ML POUR (IV SOLUTION) ×2 IMPLANT
YANKAUER SUCT BULB TIP NO VENT (SUCTIONS) ×2 IMPLANT

## 2015-05-15 NOTE — Anesthesia Postprocedure Evaluation (Signed)
Anesthesia Post Note  Patient: Connie Haynes  Procedure(s) Performed: Procedure(s) (LRB): LEFT SHOULDER REVISION TOTAL SHOULDER ARTHRPLASTY TO REVERSE TOTAL SHOULDER ARTHROPLASTY  (Left)  Patient location during evaluation: PACU Anesthesia Type: General Level of consciousness: awake and alert Pain management: pain level controlled Vital Signs Assessment: post-procedure vital signs reviewed and stable Respiratory status: spontaneous breathing, nonlabored ventilation, respiratory function stable and patient connected to nasal cannula oxygen Cardiovascular status: blood pressure returned to baseline and stable Postop Assessment: no signs of nausea or vomiting Anesthetic complications: no    Last Vitals:  Filed Vitals:   05/15/15 1935 05/15/15 2019  BP: 108/57 113/60  Pulse: 73 67  Temp:  36.5 C  Resp: 16 18    Last Pain:  Filed Vitals:   05/15/15 2020  PainSc: 4                  [name]

## 2015-05-15 NOTE — Transfer of Care (Signed)
Immediate Anesthesia Transfer of Care Note  Patient: Connie Haynes  Procedure(s) Performed: Procedure(s): LEFT SHOULDER REVISION TOTAL SHOULDER ARTHRPLASTY TO REVERSE TOTAL SHOULDER ARTHROPLASTY  (Left)  Patient Location: PACU  Anesthesia Type:GA combined with regional for post-op pain  Level of Consciousness: awake, oriented and patient cooperative  Airway & Oxygen Therapy: Patient Spontanous Breathing and Patient connected to nasal cannula oxygen  Post-op Assessment: Report given to RN, Post -op Vital signs reviewed and stable and Patient moving all extremities  Post vital signs: Reviewed and stable  Last Vitals:  Filed Vitals:   05/15/15 1315 05/15/15 1320  BP: 135/75 146/79  Pulse: 64 57  Temp:    Resp: 16 20    Complications: No apparent anesthesia complications

## 2015-05-15 NOTE — Anesthesia Procedure Notes (Addendum)
Anesthesia Regional Block:  Interscalene brachial plexus block  Pre-Anesthetic Checklist: ,, timeout performed, Correct Patient, Correct Site, Correct Laterality, Correct Procedure, Correct Position, site marked, Risks and benefits discussed,  Surgical consent,  Pre-op evaluation,  At surgeon's request and post-op pain management  Laterality: Left  Prep: chloraprep       Needles:  Injection technique: Single-shot  Needle Type: Echogenic Needle     Needle Length: 9cm 9 cm Needle Gauge: 21 and 21 G    Additional Needles:  Procedures: ultrasound guided (picture in chart) Interscalene brachial plexus block Narrative:  Start time: 05/15/2015 1:05 PM End time: 05/15/2015 1:10 PM Injection made incrementally with aspirations every 5 mL.  Performed by: Personally  Anesthesiologist: Suella Broad D  Additional Notes: No immediate complications noted. Pt tolerated well.    Procedure Name: Intubation Date/Time: 05/15/2015 3:10 PM Performed by: Jenne Campus Pre-anesthesia Checklist: Patient identified, Emergency Drugs available, Suction available and Patient being monitored Patient Re-evaluated:Patient Re-evaluated prior to inductionOxygen Delivery Method: Circle System Utilized Preoxygenation: Pre-oxygenation with 100% oxygen Intubation Type: IV induction Ventilation: Mask ventilation without difficulty Laryngoscope Size: Miller and 2 Grade View: Grade I Tube type: Oral Tube size: 7.0 mm Number of attempts: 1 Airway Equipment and Method: Stylet and Oral airway Placement Confirmation: ETT inserted through vocal cords under direct vision,  positive ETCO2 and breath sounds checked- equal and bilateral Secured at: 21 cm Tube secured with: Tape Dental Injury: Teeth and Oropharynx as per pre-operative assessment

## 2015-05-15 NOTE — Anesthesia Preprocedure Evaluation (Addendum)
Anesthesia Evaluation  Patient identified by MRN, date of birth, ID band Patient awake    Reviewed: Allergy & Precautions, NPO status , Patient's Chart, lab work & pertinent test results, reviewed documented beta blocker date and time   History of Anesthesia Complications (+) PONV and history of anesthetic complications  Airway Mallampati: II  TM Distance: >3 FB Neck ROM: Full    Dental  (+) Teeth Intact, Dental Advisory Given   Pulmonary sleep apnea ,    breath sounds clear to auscultation       Cardiovascular hypertension, Pt. on medications and Pt. on home beta blockers  Rhythm:Regular Rate:Normal     Neuro/Psych PSYCHIATRIC DISORDERS Anxiety Depression negative neurological ROS     GI/Hepatic Neg liver ROS, GERD  Medicated,  Endo/Other  negative endocrine ROS  Renal/GU Renal disease  negative genitourinary   Musculoskeletal  (+) Arthritis ,   Abdominal (+)  Abdomen: soft.    Peds negative pediatric ROS (+)  Hematology negative hematology ROS (+)   Anesthesia Other Findings - WPW Syndrome   Reproductive/Obstetrics negative OB ROS                          Lab Results  Component Value Date   WBC 5.5 05/07/2015   HGB 12.4 05/07/2015   HCT 38.3 05/07/2015   MCV 88.0 05/07/2015   PLT 233 05/07/2015   Lab Results  Component Value Date   CREATININE 1.20* 05/07/2015   BUN 16 05/07/2015   NA 136 05/07/2015   K 4.0 05/07/2015   CL 105 05/07/2015   CO2 20* 05/07/2015   Lab Results  Component Value Date   INR 0.96 05/07/2015   INR 0.87 07/28/2013   INR 0.9 07/27/2007   06/2014 EKG: sinus bradycardia.   Anesthesia Physical Anesthesia Plan  ASA: III  Anesthesia Plan: General   Post-op Pain Management: GA combined w/ Regional for post-op pain   Induction: Intravenous  Airway Management Planned: Oral ETT  Additional Equipment:   Intra-op Plan:   Post-operative Plan:  Extubation in OR  Informed Consent: I have reviewed the patients History and Physical, chart, labs and discussed the procedure including the risks, benefits and alternatives for the proposed anesthesia with the patient or authorized representative who has indicated his/her understanding and acceptance.   Dental advisory given  Plan Discussed with: CRNA  Anesthesia Plan Comments:         Anesthesia Quick Evaluation

## 2015-05-15 NOTE — Brief Op Note (Signed)
05/15/2015  5:33 PM  PATIENT:  Connie Haynes  60 y.o. female  PRE-OPERATIVE DIAGNOSIS:  LEFT SHOULDER FAILED TOTAL SHOULDER ARTHROPLASTY  POST-OPERATIVE DIAGNOSIS:  LEFT SHOULDER FAILED TOTAL SHOULDER ARTHROPLASTY, ROTATOR CUFF INSUFFICIENCY  PROCEDURE:  Procedure(s): LEFT SHOULDER REVISION TOTAL SHOULDER ARTHRPLASTY TO REVERSE TOTAL SHOULDER ARTHROPLASTY  (Left)  SURGEON:  Surgeon(s) and Role:    * Netta Cedars, MD - Primary  PHYSICIAN ASSISTANT:   ASSISTANTS: [name], PA-C   ANESTHESIA:   regional and general  EBL:  Total I/O In: 1500 [I.V.:1500] Out: 385 [Urine:235; Blood:150]  BLOOD ADMINISTERED:none  DRAINS: none   LOCAL MEDICATIONS USED:  MARCAINE     SPECIMEN:  Source of Specimen:  Left shoulder deep fluid culture  DISPOSITION OF SPECIMEN:  micro  COUNTS:  YES  TOURNIQUET:  * No tourniquets in log *  DICTATION: .Other Dictation: Dictation Number [id number]  PLAN OF CARE: Admit to inpatient   PATIENT DISPOSITION:  PACU - hemodynamically stable.   Delay start of Pharmacological VTE agent (>24hrs) due to surgical blood loss or risk of bleeding: not applicable

## 2015-05-15 NOTE — Discharge Instructions (Signed)
Ice to the shoulder as much as you can.  Ok to remove the sling as you would like.  Ok to use the arm for light activity.  Do not push up your full body weight with the left arm.  Keep the incision covered and clean and dry for one week, then ok to get it wet in the shower.   Follow up with Dr Veverly Fells in two weeks in the office [phone]

## 2015-05-15 NOTE — Interval H&P Note (Signed)
History and Physical Interval Note:  05/15/2015 2:53 PM  Connie Haynes  has presented today for surgery, with the diagnosis of [place]   The various methods of treatment have been discussed with the patient and family. After consideration of risks, benefits and other options for treatment, the patient has consented to  Procedure(s): Center Line  (Left) as a surgical intervention .  The patient's history has been reviewed, patient examined, no change in status, stable for surgery.  I have reviewed the patient's chart and labs.  Questions were answered to the patient's satisfaction.     Wava Kildow,STEVEN R

## 2015-05-16 ENCOUNTER — Encounter (HOSPITAL_COMMUNITY): Payer: Self-pay | Admitting: General Practice

## 2015-05-16 LAB — BASIC METABOLIC PANEL
Anion gap: 12 (ref 5–15)
BUN: 16 mg/dL (ref 6–20)
CO2: 20 mmol/L — ABNORMAL LOW (ref 22–32)
Calcium: 8.2 mg/dL — ABNORMAL LOW (ref 8.9–10.3)
Chloride: 104 mmol/L (ref 101–111)
Creatinine, Ser: 1.9 mg/dL — ABNORMAL HIGH (ref 0.44–1.00)
GFR calc Af Amer: 32 mL/min — ABNORMAL LOW (ref >60)
GFR calc non Af Amer: 27 mL/min — ABNORMAL LOW (ref >60)
Glucose, Bld: 118 mg/dL — ABNORMAL HIGH (ref 65–99)
Potassium: 3.9 mmol/L (ref 3.5–5.1)
Sodium: 136 mmol/L (ref 135–145)

## 2015-05-16 LAB — CBC WITH DIFFERENTIAL/PLATELET
Basophils Absolute: 0 10*3/uL (ref 0.0–0.1)
Basophils Relative: 0 %
Eosinophils Absolute: 0.3 10*3/uL (ref 0.0–0.7)
Eosinophils Relative: 4 %
HCT: 32.9 % — ABNORMAL LOW (ref 36.0–46.0)
Hemoglobin: 11 g/dL — ABNORMAL LOW (ref 12.0–15.0)
Lymphocytes Relative: 12 %
Lymphs Abs: 1 10*3/uL (ref 0.7–4.0)
MCH: 30.1 pg (ref 26.0–34.0)
MCHC: 33.4 g/dL (ref 30.0–36.0)
MCV: 89.9 fL (ref 78.0–100.0)
Monocytes Absolute: 0.4 10*3/uL (ref 0.1–1.0)
Monocytes Relative: 5 %
Neutro Abs: 6.8 10*3/uL (ref 1.7–7.7)
Neutrophils Relative %: 79 %
Platelets: 275 10*3/uL (ref 150–400)
RBC: 3.66 MIL/uL — ABNORMAL LOW (ref 3.87–5.11)
RDW: 13.6 % (ref 11.5–15.5)
WBC: 8.6 10*3/uL (ref 4.0–10.5)

## 2015-05-16 MED ORDER — SODIUM CHLORIDE 0.9 % IV BOLUS (SEPSIS)
500.0000 mL | Freq: Once | INTRAVENOUS | Status: AC
Start: 2015-05-16 — End: 2015-05-16
  Administered 2015-05-16: 500 mL via INTRAVENOUS

## 2015-05-16 MED ORDER — CARVEDILOL 12.5 MG PO TABS
12.5000 mg | ORAL_TABLET | Freq: Two times a day (BID) | ORAL | Status: DC
  Administered 2015-05-16: 12.5 mg via ORAL
  Filled 2015-05-16 (×2): qty 1

## 2015-05-16 NOTE — Progress Notes (Signed)
Orthopedics Progress Note  Subjective: Patient complains of nausea and weakness and pain in the left shoulder  Objective:  Filed Vitals:   05/15/15 2019 05/16/15 0552  BP: 113/60 118/50  Pulse: 67 93  Temp: 97.7 F (36.5 C) 97.6 F (36.4 C)  Resp: 18 16    General: Awake and alert  Musculoskeletal: left shoulder wound CDI, bandage changed to gel bandage. Minimal swelling Neurovascularly intact  Lab Results  Component Value Date   WBC 5.5 05/07/2015   HGB 12.4 05/07/2015   HCT 38.3 05/07/2015   MCV 88.0 05/07/2015   PLT 233 05/07/2015       Component Value Date/Time   NA 136 05/07/2015 1017   K 4.0 05/07/2015 1017   CL 105 05/07/2015 1017   CO2 20* 05/07/2015 1017   GLUCOSE 94 05/07/2015 1017   BUN 16 05/07/2015 1017   CREATININE 1.20* 05/07/2015 1017   CREATININE 0.82 05/23/2012 1601   CALCIUM 8.8* 05/07/2015 1017   GFRNONAA 47* 05/07/2015 1017   GFRAA 55* 05/07/2015 1017    Lab Results  Component Value Date   INR 0.96 05/07/2015   INR 0.87 07/28/2013   INR 0.9 07/27/2007    Assessment/Plan: POD #1 s/p Procedure(s): LEFT SHOULDER REVISION TOTAL SHOULDER ARTHRPLASTY TO REVERSE TOTAL SHOULDER ARTHROPLASTY  Patient feels too weak to go home. ? Dehydration following N/V Will hydrate and continue to work on the nausea.  She will need to stay one more night. Plan D/C tomorrow after therapy  Doran Heater. Veverly Fells, MD 05/16/2015 12:43 PM

## 2015-05-16 NOTE — Progress Notes (Signed)
Occupational Therapy Evaluation Patient Details Name: Connie Haynes MRN: [id number] DOB: [birth year] Today's Date: 05/16/2015    History of Present Illness s/p Left shoulder revision total shoulder arthroplasty; PMH includes previous shoulder surgeries   Clinical Impression   Pt admitted with the above diagnoses and presents with below problem list. Pt will benefit from continued acute OT to address the below listed deficits and maximize independence with BADLs prior to d/c home with family. PTA pt was independent to mod I with ADLs. Pt is currently mod A with ADLs, min A for transfers. Session limited by dizziness and nausea standing EOB. No mobility attempted due to dizziness with pt having 1 LOB requiring min A while sidestepping along EOB. Feel pt is not ready for d/c at this time. Weakness noted in LUE at shoulder through wrist levels. OT to continue to follow.    Follow Up Recommendations  Outpatient OT;Supervision/Assistance - 24 hour    Equipment Recommendations  None recommended by OT    Recommendations for Other Services       Precautions / Restrictions Precautions Precautions: Shoulder Type of Shoulder Precautions: NWB; OT Active Protocol  Shoulder Interventions: Shoulder sling/immobilizer Precaution Booklet Issued: Yes (comment) Required Braces or Orthoses: Spinal Brace Restrictions Weight Bearing Restrictions: Yes LUE Weight Bearing: Non weight bearing      Mobility Bed Mobility Overal bed mobility: Needs Assistance Bed Mobility: Supine to Sit;Sit to Supine     Supine to sit: Min guard Sit to supine: Min guard   General bed mobility comments: min guard for safety, HOB elevated, bed rails utilized  Transfers Overall transfer level: Needs assistance Equipment used: Rolling walker (2 wheeled) Transfers: Sit to/from Stand Sit to Stand: Min assist         General transfer comment: HHA and support at waist for balance, + dizziness    Balance Overall  balance assessment: Needs assistance Sitting-balance support: Feet supported;Single extremity supported;No upper extremity supported Sitting balance-Leahy Scale: Good     Standing balance support: Single extremity supported;During functional activity Standing balance-Leahy Scale: Poor Standing balance comment: min A for balance, HHA                            ADL Overall ADL's : Needs assistance/impaired Eating/Feeding: Set up;Sitting   Grooming: Set up;Sitting   Upper Body Bathing: Moderate assistance;Sitting   Lower Body Bathing: Sit to/from stand;Moderate assistance   Upper Body Dressing : Moderate assistance;Sitting   Lower Body Dressing: Moderate assistance;Sit to/from stand   Toilet Transfer: Minimal Production assistant, radio Details (indicate cue type and reason): therapist providing HHA  and support at waist for balance Toileting- Clothing Manipulation and Hygiene: Minimal assistance;Sit to/from stand       Functional mobility during ADLs:  (not attempted due to dizziness/nausea) General ADL Comments: Pt + dizziness/nausea in standing. Sidesteps along EOB completed initially with 1 LOB noted needing min A to correct. Pt later completed SPT to [facility] with min A. Shoulder handouts for exercise and ADLs provided to pt. Pt completed exercises at detailed below.      Vision     Perception     Praxis      Pertinent Vitals/Pain Pain Assessment: 0-10 Pain Score: 5  Pain Location: Left shoulder Pain Descriptors / Indicators: Aching;Sore;Tiring Pain Intervention(s): Limited activity within patient's tolerance;Monitored during session;Repositioned;Premedicated before session;Ice applied     Hand Dominance Left   Extremity/Trunk Assessment Upper Extremity Assessment Upper Extremity Assessment: LUE deficits/detail  LUE Deficits / Details: s/p shoulder repair; weakness noted at shoulder to wrist. LUE: Unable to fully assess due to pain    Lower Extremity Assessment Lower Extremity Assessment: Defer to PT evaluation       Communication Communication Communication: No difficulties   Cognition Arousal/Alertness: Awake/alert Behavior During Therapy: WFL for tasks assessed/performed Overall Cognitive Status: Within Functional Limits for tasks assessed                     General Comments    Pt c/o feeling urgent need to urinate during session. Nursing notified with pt reporting great discomfort. Pt reported sensation started "during therapy and maybe a little before therapy." Nursing removed catheter during session with pt reporting immediate sense of relief, "I feel 100 times better." Assisted pt to/from [facility]. Pt attempted but did not void.     Exercises Exercises: Shoulder     Shoulder Instructions      Home Living Family/patient expects to be discharged to:: Private residence Living Arrangements: Spouse/significant other Available Help at Discharge: Family Type of Home: House Home Access: Stairs to enter [employer] of Steps: 2 Entrance Stairs-Rails: None Home Layout: Two level;Able to live on main level with bedroom/bathroom     Bathroom Shower/Tub: Occupational psychologist: Standard     Home Equipment: Environmental consultant - 2 wheels;Bedside commode;Cane - single point          Prior Functioning/Environment Level of Independence: Independent             OT Diagnosis: Acute pain;Paresis (LUE)   OT Problem List: Decreased strength;Decreased range of motion;Impaired balance (sitting and/or standing);Decreased knowledge of use of DME or AE;Decreased knowledge of precautions;Pain;Impaired UE functional use   OT Treatment/Interventions: Self-care/ADL training;Therapeutic exercise;DME and/or AE instruction;Therapeutic activities;Patient/family education;Balance training    OT Goals(Current goals can be found in the care plan section) Acute Rehab OT Goals Patient Stated Goal: not  stated OT Goal Formulation: With patient Time For Goal Achievement: 05/23/15 Potential to Achieve Goals: Good ADL Goals Pt Will Perform Upper Body Bathing: with modified independence;sitting Pt Will Perform Lower Body Bathing: with supervision;with adaptive equipment;sit to/from stand Pt Will Perform Upper Body Dressing: with modified independence;sitting Pt Will Perform Lower Body Dressing: with supervision;with adaptive equipment;sit to/from stand Pt Will Transfer to Toilet: with modified independence;ambulating (3n1 over toilet) Pt Will Perform Toileting - Clothing Manipulation and hygiene: with modified independence;sit to/from stand Pt Will Perform Tub/Shower Transfer: Shower transfer;with modified independence;ambulating;3 in 1 Pt/caregiver will Perform Home Exercise Program: Left upper extremity;With written HEP provided (active protocol)  OT Frequency: Min 3X/week   Barriers to D/C:            Co-evaluation              End of Session Equipment Utilized During Treatment: Other (comment) (sling) Nurse Communication: Other (comment) (see general comments)  Activity Tolerance: Patient limited by pain;Other (comment) (dizziness, nausea) Patient left: in bed;with call bell/phone within reach;with family/visitor present   Time: 1001-1053 OT Time Calculation (min): 52 min Charges:  OT General Charges $OT Visit: 1 Procedure OT Evaluation $OT Eval Low Complexity: 1 Procedure OT Treatments $Self Care/Home Management : 8-22 mins $Therapeutic Exercise: 8-22 mins G-Codes:    Hortencia Pilar 06-15-2015, 11:26 AM

## 2015-05-16 NOTE — Op Note (Signed)
NAMETANGALA, TIERRABLANCA                  ACCOUNT NO.:  [id number]  MEDICAL RECORD NO.:  [id number]  LOCATION:  5N06C                        FACILITY:  [facility]  PHYSICIAN:  Doran Heater. Veverly Fells, M.D. DATE OF BIRTH:  1955/02/27  DATE OF PROCEDURE:  05/15/2015 DATE OF DISCHARGE:                              OPERATIVE REPORT   PREOPERATIVE DIAGNOSIS:  Left shoulder failed total shoulder arthroplasty.  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  PROCEDURE PERFORMED:  Left shoulder revision total shoulder arthroplasty to reverse total shoulder arthroplasty using DePuy Delta Xtend prosthesis.  ATTENDING SURGEON:  Doran Heater. Veverly Fells, M.D.  ASSISTANT:  Abbott Pao. Dixon, PA-C, who was scrubbed the entire procedure and necessary for satisfactory completion of surgery.  ANESTHESIA:  General anesthesia was used plus interscalene block.  ESTIMATED BLOOD LOSS:  Minimal.  FLUID REPLACEMENT:  1200 mL crystalloid.  COUNTS:  Instrument counts were correct.  COMPLICATIONS:  There were no complications.  ANTIBIOTICS:  Perioperative antibiotics were given.  INDICATIONS:  The patient is a 60 year old female with history of total shoulder arthroplasty several years ago.  The patient has had ongoing pain since her surgery, which is failed to resolve with conservative means.  Recent imaging suggesting potential glenoid loosening.  The patient has had no signs of any systemic infection or deep joint infection.  We discussed options for management, the patient's function has declined steadily, she really has almost no forward elevation or abduction concerning for rotator cuff insufficiency and lack of fixed fulcrum mechanics.  We discussed revising to a reverse shoulder arthroplasty to relieve pain and restore function of the shoulder and the patient strongly agreed with this as she has a reverse shoulder on her right and is quite happy  with that.  Risks and benefits of surgery were discussed, informed consent obtained.  DESCRIPTION OF PROCEDURE:  After an adequate level of anesthesia achieved, the patient was positioned in the modified beach-chair position, and left shoulder correctly identified and sterilely prepped and draped in usual manner.  Time-out called.  Exam under anesthesia revealed external rotation beyond 90 degrees, indicating no subscap passive motion basically full and unobstructed.  We then entered the shoulder using standard deltopectoral incision started at the coracoid process extending down to the anterior humerus.  Dissection was down through the subcutaneous tissues, identified the deltopectoral plane divided that and then elevated the deltoid off the humerus.  There was really no subscap, no supraspinatus or infraspinatus teres minor, seemed to be intact.  We removed the little bit of scarred infraspinatus, but this was thinned and really did not look quite normal tendon or muscle. We released all the way back to the junction of the infraspinatus and teres.  We left the teres intact just to hopefully avoid the horn-blower phenomenon.  There was no evidence of any deep infection.  I did not see any cloudy fluid.  The humeral head was removed with a wedge.  We then removed the bone around the proximal portion of the humeral component. We then placed our stem inserted and retractor in place a couple of distal impacts and then used the mallet to  extract the global advantage stem without any difficulty.  This was a size 10 stem.  Again, there were no signs of any infection.  We then subluxed the humerus posteriorly after releasing the inferior capsule or pseudocapsule.  We were careful to protect the axillary nerve using our local anatomical references.  We then went to the glenoid.  We removed the glenoid component, it was an anchor Peg glenoid, which was definitely loose, it did not appear grossly  loose until I placed a Cobb elevator under the edge of the glenoid and then, I was able to wiggle the Cobb and then definitely get the implant moving, so it did come out with a Cobb and a joker leaving four holes, the central large Peg hole and then three peripheral holes.  These were contained defects.  We used curettes and rongeurs to remove all soft tissue from these holes.  There was no evidence of any stability at all, any ingrowth to the APG Peg.  We sent that all to be cleaned and processed for getting back the patient.  Once we had the glenoid completely cleaned out and prepared, we used pulse irrigator, couple of liters of irrigation to clean out that area and then we took some deep cultures with some of the soft tissue that were removing, ensure doing her glenoid preparation and did aerobic, anaerobic and gram stain culture.  We then used allograft bone graft, cancellous chips and filled up the holes and then used the impactor for the metaglene to make sure that all that bone was compacted as best we possibly could.  We then drilled a central guidepin, which found good purchase.  We then went ahead and tested the little trial metaglene that they have as the yellow plastic metaglene and we were able to place that down and make sure that it seated appropriately and the convexity of the metaglene that matched nicely with convexity of the prepared glenoid. We did our peripheral hand reamer.  We then drilled out the central Peg hole.  We impacted the real metaglene into position and good purchase and good stability.  We then used a peripheral screws of 48 inferiorly, 36 in the base of the coracoid, and an 18 nonlocked posteriorly.  ALPS screws purchased very well and that we had good compression of the metaglene into the native prepared glenoid.  We then placed a 38 eccentric glenosphere in position with the centricity down inferiorly to get better clearance inferiorly off the  scapular neck.  Once that was impacted and screwed into place, then we went ahead and prepared our humerus.  We reamed with hand reamers, we went down through the pedestal and reamed up to size 10.  We then went ahead and impacted our metaphyseal reaming guide into position and then reamed and it was a size 1 central reamer.  Then, we reamed for the humeral component with the Delta Xtend reverse shoulder placement.  We then impacted a 10 body epi-1 centered metaphysis set on 0 and placed in 10 degrees of retroversion and impacted that in position.  We reduced the shoulder with 38 +3 poly trial, felt like the patient could probably tolerate a +6.  We removed the trial components, irrigated the canal with pulse irrigator, dried the canal and then used DePuy 1 cement, mixed with tobramycin and also vancomycin, 1 g of vancomycin and 1.3 g of tobramycin mixed into the cement.  We then placed that cement by hand, hand packed down into  the canal.  We then impacted the HA coated 10 body size 1 centered metaphysis, placed in 10 degrees of retroversion and impacted to the same depth.  We felt very comfortable with the depth that we had and we selected a 38 +6 poly, the real 1.  We impacted that in position, reduced the shoulder with a nice pop and had excellent stability, negative sulcus, negative gapping with external rotation and tight conjoined.  Really quite stable, was pleased with that.  We irrigated thoroughly, no impinging going on and then we went ahead and irrigated with pulse irrigated and then closed with the deltopectoral interval with 0 Vicryl suture followed by 2-0 Vicryl subcutaneous closure and staples for skin.  Sterile bandage was applied.  The patient tolerated the surgery well.     Doran Heater. Veverly Fells, M.D.     SRN/MEDQ  D:  05/15/2015  T:  05/16/2015  Job:  [id number]

## 2015-05-17 ENCOUNTER — Encounter (HOSPITAL_COMMUNITY): Payer: Self-pay | Admitting: Orthopedic Surgery

## 2015-05-17 LAB — BASIC METABOLIC PANEL
Anion gap: 14 (ref 5–15)
BUN: 22 mg/dL — ABNORMAL HIGH (ref 6–20)
CO2: 16 mmol/L — ABNORMAL LOW (ref 22–32)
Calcium: 8.3 mg/dL — ABNORMAL LOW (ref 8.9–10.3)
Chloride: 103 mmol/L (ref 101–111)
Creatinine, Ser: 2.11 mg/dL — ABNORMAL HIGH (ref 0.44–1.00)
GFR calc Af Amer: 28 mL/min — ABNORMAL LOW (ref >60)
GFR calc non Af Amer: 24 mL/min — ABNORMAL LOW (ref >60)
Glucose, Bld: 92 mg/dL (ref 65–99)
Potassium: 5 mmol/L (ref 3.5–5.1)
Sodium: 133 mmol/L — ABNORMAL LOW (ref 135–145)

## 2015-05-17 MED ORDER — SODIUM CHLORIDE 0.9 % IV BOLUS (SEPSIS)
500.0000 mL | Freq: Once | INTRAVENOUS | Status: AC
  Administered 2015-05-17: 500 mL via INTRAVENOUS

## 2015-05-17 NOTE — Progress Notes (Signed)
Pt ready for discharge. Education/instructions reviewed with pt and husband, and all questions/concerns addressed. IV removed and belongings gathered. Pt will be transported out via wheelchair to husband's car. Will continue to monitor 

## 2015-05-17 NOTE — Progress Notes (Signed)
   Subjective: 2 Days Post-Op Procedure(s) (LRB): LEFT SHOULDER REVISION TOTAL SHOULDER ARTHRPLASTY TO REVERSE TOTAL SHOULDER ARTHROPLASTY  (Left)  Pt feeling much better today Yesterday was rough with nausea and hypotension Ready for d/c home  Patient reports pain as mild.  Objective:   VITALS:   Filed Vitals:   05/17/15 0230 05/17/15 0545  BP: 90/55 93/51  Pulse:  65  Temp:  97.7 F (36.5 C)  Resp:  16    Left shoulder incision healing well nv intact distally No rashes or edema  Sling in place  LABS  Recent Labs  05/16/15 1139  HGB 11.0*  HCT 32.9*  WBC 8.6  PLT 275     Recent Labs  05/16/15 1139 05/17/15 0438  NA 136 133*  K 3.9 5.0  BUN 16 22*  CREATININE 1.90* 2.11*  GLUCOSE 118* 92     Assessment/Plan: 2 Days Post-Op Procedure(s) (LRB): LEFT SHOULDER REVISION TOTAL SHOULDER ARTHRPLASTY TO REVERSE TOTAL SHOULDER ARTHROPLASTY  (Left) D/c home today F/u in 2 weeks    Merla Riches, MPAS, PA-C  05/17/2015, 7:58 AM

## 2015-05-17 NOTE — Discharge Summary (Signed)
Physician Discharge Summary   Patient ID: Connie Haynes MRN: [id number] DOB/AGE: November 08, 1955 60 y.o.  Admit date: 05/15/2015 Discharge date: 05/17/2015  Admission Diagnoses:  Active Problems:   S/P shoulder replacement   Discharge Diagnoses:  Same   Surgeries: Procedure(s): LEFT SHOULDER REVISION TOTAL SHOULDER ARTHRPLASTY TO REVERSE TOTAL SHOULDER ARTHROPLASTY  on 05/15/2015   Consultants: PT/OT  Discharged Condition: Stable  Hospital Course: Connie Haynes is an 60 y.o. female who was admitted 05/15/2015 with a chief complaint of No chief complaint on file. , and found to have a diagnosis of <principal problem not specified>.  They were brought to the operating room on 05/15/2015 and underwent the above named procedures.    The patient had an uncomplicated hospital course and was stable for discharge.  Recent vital signs:  Filed Vitals:   05/17/15 0230 05/17/15 0545  BP: 90/55 93/51  Pulse:  65  Temp:  97.7 F (36.5 C)  Resp:  16    Recent laboratory studies:  Results for orders placed or performed during the hospital encounter of 05/15/15  Anaerobic culture  Result Value Ref Range   Specimen Description FLUID SYNOVIAL LEFT SHOULDER    Special Requests NONE    Gram Stain      RARE WBC PRESENT, PREDOMINANTLY MONONUCLEAR NO ORGANISMS SEEN    Culture      NO ANAEROBES ISOLATED; CULTURE IN PROGRESS FOR 5 DAYS   Report Status PENDING   Body fluid culture  Result Value Ref Range   Specimen Description FLUID SYNOVIAL LEFT SHOULDER    Special Requests NONE    Gram Stain      RARE WBC PRESENT, PREDOMINANTLY MONONUCLEAR NO ORGANISMS SEEN    Culture NO GROWTH < 12 HOURS    Report Status PENDING   CBC with Differential/Platelet  Result Value Ref Range   WBC 8.6 4.0 - 10.5 K/uL   RBC 3.66 (L) 3.87 - 5.11 MIL/uL   Hemoglobin 11.0 (L) 12.0 - 15.0 g/dL   HCT 32.9 (L) 36.0 - 46.0 %   MCV 89.9 78.0 - 100.0 fL   MCH 30.1 26.0 - 34.0 pg   MCHC 33.4 30.0 - 36.0 g/dL   RDW 13.6  11.5 - 15.5 %   Platelets 275 150 - 400 K/uL   Neutrophils Relative % 79 %   Neutro Abs 6.8 1.7 - 7.7 K/uL   Lymphocytes Relative 12 %   Lymphs Abs 1.0 0.7 - 4.0 K/uL   Monocytes Relative 5 %   Monocytes Absolute 0.4 0.1 - 1.0 K/uL   Eosinophils Relative 4 %   Eosinophils Absolute 0.3 0.0 - 0.7 K/uL   Basophils Relative 0 %   Basophils Absolute 0.0 0.0 - 0.1 K/uL  Basic metabolic panel  Result Value Ref Range   Sodium 136 135 - 145 mmol/L   Potassium 3.9 3.5 - 5.1 mmol/L   Chloride 104 101 - 111 mmol/L   CO2 20 (L) 22 - 32 mmol/L   Glucose, Bld 118 (H) 65 - 99 mg/dL   BUN 16 6 - 20 mg/dL   Creatinine, Ser 1.90 (H) 0.44 - 1.00 mg/dL   Calcium 8.2 (L) 8.9 - 10.3 mg/dL   GFR calc non Af Amer 27 (L) >60 mL/min   GFR calc Af Amer 32 (L) >60 mL/min   Anion gap 12 5 - 15  Basic metabolic panel  Result Value Ref Range   Sodium 133 (L) 135 - 145 mmol/L   Potassium 5.0 3.5 - 5.1  mmol/L   Chloride 103 101 - 111 mmol/L   CO2 16 (L) 22 - 32 mmol/L   Glucose, Bld 92 65 - 99 mg/dL   BUN 22 (H) 6 - 20 mg/dL   Creatinine, Ser 2.11 (H) 0.44 - 1.00 mg/dL   Calcium 8.3 (L) 8.9 - 10.3 mg/dL   GFR calc non Af Amer 24 (L) >60 mL/min   GFR calc Af Amer 28 (L) >60 mL/min   Anion gap 14 5 - 15    Discharge Medications:     Medication List    TAKE these medications        AMITIZA 24 MCG capsule  Generic drug:  lubiprostone  Take 24 mg by mouth 2 (two) times daily.     amLODipine 5 MG tablet  Commonly known as:  NORVASC  Take 1 tablet (5 mg total) by mouth daily.     carvedilol 25 MG tablet  Commonly known as:  COREG  Take 1 tablet (25 mg total) by mouth 2 (two) times daily.     celecoxib 200 MG capsule  Commonly known as:  CELEBREX  Take 200 mg by mouth 2 (two) times daily.     cholecalciferol 1000 units tablet  Commonly known as:  VITAMIN D  Take 1,000 Units by mouth daily.     CRANBERRY PO  Take 1 tablet by mouth daily.     esomeprazole 20 MG capsule  Commonly known as:   NEXIUM  Take 20 mg by mouth daily as needed (indigestion.).     losartan-hydrochlorothiazide 100-12.5 MG tablet  Commonly known as:  HYZAAR  Take 1 tablet by mouth every morning.     metaxalone 800 MG tablet  Commonly known as:  SKELAXIN  Take 1 tablet (800 mg total) by mouth 4 (four) times daily as needed for muscle spasms.     multivitamin tablet  Take 1 tablet by mouth every morning.     oxyCODONE-acetaminophen 5-325 MG tablet  Commonly known as:  ROXICET  Take 1-2 tablets by mouth every 4 (four) hours as needed for severe pain.     oxyCODONE-acetaminophen 5-325 MG tablet  Commonly known as:  ROXICET  Take 1-2 tablets by mouth every 4 (four) hours as needed for severe pain.     rOPINIRole 4 MG tablet  Commonly known as:  REQUIP  Take 4 mg by mouth at bedtime.      ASK your doctor about these medications        promethazine 25 MG tablet  Commonly known as:  PHENERGAN  Take 1 tab po at bedtime and then q 6 hrs as needed for nausea vomiting  Ask about: Which instructions should I use?     promethazine 25 MG tablet  Commonly known as:  PHENERGAN  Take 1 tablet (25 mg total) by mouth every 6 (six) hours as needed for nausea or vomiting.  Ask about: Which instructions should I use?     promethazine 25 MG tablet  Commonly known as:  PHENERGAN  Take 1 tablet (25 mg total) by mouth every 6 (six) hours as needed for nausea or vomiting.  Ask about: Which instructions should I use?        Diagnostic Studies: Dg Shoulder Left Port  05/15/2015  CLINICAL DATA:  Left shoulder replacement EXAM: LEFT SHOULDER - 1 VIEW COMPARISON:  None. FINDINGS: There is a left total shoulder arthroplasty. Components appear well aligned. There is no fracture or other immediate complication. IMPRESSION: Left total shoulder  arthroplasty Electronically Signed   By: Andreas Newport M.D.   On: 05/15/2015 18:33    Disposition: 01-Home or Self Care        Follow-up Information    Follow up with  NORRIS,STEVEN R, MD. Call in 2 weeks.   Specialty:  Orthopedic Surgery   Why:  [phone]   Contact information:   [address] [place] [address] [id number]        Signed: [name] 05/17/2015, 7:59 AM

## 2015-05-17 NOTE — Progress Notes (Signed)
Utilization review completed.  

## 2015-05-17 NOTE — Progress Notes (Signed)
Occupational Therapy Treatment Patient Details Name: OWEN HANSELMAN MRN: [id number] DOB: 03/28/1955 Today's Date: 05/17/2015    History of present illness s/p Left shoulder revision total shoulder arthroplasty; PMH includes previous shoulder surgeries   OT comments  Pt. Found standing in b.room performing oral care fully dressed.  States she feels much better today.  Spouse present and states he assisted with UB dressing and sling management.  Pt.s sling in proper position.  Both verbalize understanding of stated precautions.  Reviewed exercise handout provided yesterday and answered all questions regarding technique.  Eager for d/c home later today.    Follow Up Recommendations  Outpatient OT;Supervision/Assistance - 24 hour    Equipment Recommendations  None recommended by OT    Recommendations for Other Services      Precautions / Restrictions Precautions Precautions: Shoulder Type of Shoulder Precautions: NWB; OT Active Protocol  Shoulder Interventions: Shoulder sling/immobilizer Required Braces or Orthoses: Sling Restrictions Weight Bearing Restrictions: Yes LUE Weight Bearing: Non weight bearing       Mobility Bed Mobility                  Transfers                      Balance                                   ADL                                                Vision                     Perception     Praxis      Cognition                             Extremity/Trunk Assessment               Exercises Other Exercises Other Exercises: pt. wanted to review handout provided yesterday as she states she was not feeling well the day before. spouse present and also able to recall all indicated exercises on hand out Donning/doffing shirt without moving shoulder: Caregiver independent with task Donning/doffing sling/immobilizer: Caregiver independent with task Correct positioning of  sling/immobilizer: Caregiver independent with task ROM for elbow, wrist and digits of operated UE: Supervision/safety Sling wearing schedule (on at all times/off for ADL's): Caregiver independent with task   Shoulder Instructions Shoulder Instructions Donning/doffing shirt without moving shoulder: Caregiver independent with task Donning/doffing sling/immobilizer: Caregiver independent with task Correct positioning of sling/immobilizer: Caregiver independent with task ROM for elbow, wrist and digits of operated UE: Supervision/safety Sling wearing schedule (on at all times/off for ADL's): Caregiver independent with task     General Comments      Pertinent Vitals/ Pain          Home Living                                          Prior Functioning/Environment              Frequency Min 3X/week  Progress Toward Goals  OT Goals(current goals can now be found in the care plan section)  Progress towards OT goals: Progressing toward goals     Plan Discharge plan remains appropriate    Co-evaluation                 End of Session     Activity Tolerance Patient tolerated treatment well   Patient Left Other (comment) (standing in b.room performing oral care)   Nurse Communication          [id number] OT Time Calculation (min): 9 min  Charges: OT General Charges $OT Visit: 1 Procedure OT Treatments $Therapeutic Exercise: 8-22 mins  Janice Coffin, COTA/L 05/17/2015, 10:07 AM

## 2015-05-17 NOTE — Anesthesia Postprocedure Evaluation (Signed)
Anesthesia Post Note  Patient: Connie Haynes  Procedure(s) Performed: Procedure(s) (LRB): LEFT SHOULDER REVISION TOTAL SHOULDER ARTHRPLASTY TO REVERSE TOTAL SHOULDER ARTHROPLASTY  (Left)  Patient location during evaluation: PACU Anesthesia Type: General Level of consciousness: sedated and patient cooperative Pain management: pain level controlled Vital Signs Assessment: post-procedure vital signs reviewed and stable Respiratory status: spontaneous breathing Cardiovascular status: stable Anesthetic complications: no    Last Vitals:  Filed Vitals:   05/17/15 0230 05/17/15 0545  BP: 90/55 93/51  Pulse:  65  Temp:  36.5 C  Resp:  16    Last Pain:  Filed Vitals:   05/17/15 0545  PainSc: [place]

## 2015-05-17 NOTE — Progress Notes (Signed)
Patient continues to have some nausea, lightheadedness, dizziness and weakness. Patient's B/P trended down throughout the night. Notified on call PA. 500 bolus was ordered and completed. Patient's post-bolus B/P was 93/51. Patient stated that, "she still felt some dizziness and lightheadedness but it is not as bad". Patient continues to have weakness.  Patient is voiding scant amount. Patient voided 100 ml and 75 ml on this shift. Patient continues to retain urine. Bladder scan at 0530 showed 96. Patient is encouraged to drink fluids.   Will continue to monitor.

## 2015-05-19 LAB — BODY FLUID CULTURE: Culture: NO GROWTH

## 2015-05-21 LAB — ANAEROBIC CULTURE

## 2015-06-06 ENCOUNTER — Telehealth: Payer: Self-pay | Admitting: Internal Medicine

## 2015-06-06 DIAGNOSIS — N289 Disorder of kidney and ureter, unspecified: Secondary | ICD-10-CM

## 2015-06-06 NOTE — Telephone Encounter (Signed)
New Message  Pt c/o BP issue:  1. What are your last 5 BP readings?  Yesterday 95/53  Then 103/53 Last night before bed 85/51  2. Are you having any other symptoms (ex. Dizziness, headache, blurred vision, passed out)? Dizziness, headache, blurred vision, for about 3 weeks 3. What is your medication issue? Pt is not sure if it is a BP issue Request a call back to discuss

## 2015-06-06 NOTE — Telephone Encounter (Signed)
Maybe appropriate to see PCP, byut with renal issues would have her hold her losartan Will need  BMET  Tried to contact PCP but could not connect

## 2015-06-06 NOTE — Telephone Encounter (Signed)
I called and spoke with the patient. She is s/p shoulder replacement on 05/16/15. She reports that she was having some low BP readings prior to surgery, but also experienced this inpatient during her stay for her surgery. She states she was given some IV fluids to help with this. She was told to call her cardiologist to follow up. She states she has waited about 2 weeks to see if low BP's would resolve post surgery, but she did have episode of low BP yesterday (95/53) where she became pre-syncopal. She is having low BP readings everyday at home, but yesterday was the first time she felt pre-syncopal. She is currently on amlodipine 5 mg daily, coreg 12.5 mg BID, and losartan/hctz 100/12.5 mg daily. She will feel her heart beating hard with low BP's, but yesterday her HR was 60 bpm with her pre-syncopal spell.  In reviewing her labs-  05/16/15- Hgb- 11.0 (surgery) 05/17/15- BUN/ creat- 22/2.1 05/16/15- BUN/ creat- 16/1.9 05/07/15- BUN/ creat- 16/1.2 12/19/14- BUN/ creat- 11/0.82  I advised the patient I would review with Dr. Caryl Comes to see if med changes/ repeat labs are needed at this time. I will call her back with MD recommendations. She is agreeable.

## 2015-06-07 NOTE — Telephone Encounter (Signed)
I spoke with the patient. She is aware to hold losartan and have a repeat BMP done. She is aware if this is an ongoing issue for her, that she should follow up with her PCP. She states that she has tried to follow up with her PCP in the past for her BP at Dr. Olin Pia request and the PCP was quite upset about this. She already has a follow up with our PA on 5/9.  The patient will be out of town next week. She will hold losartan and we will repeat her BMP on 06/18/15. She is agreeable.

## 2015-06-12 ENCOUNTER — Telehealth: Payer: Self-pay | Admitting: Internal Medicine

## 2015-06-12 MED ORDER — FUROSEMIDE 20 MG PO TABS: 20.0000 mg | ORAL_TABLET | Freq: Every day | ORAL | Status: DC

## 2015-06-12 NOTE — Telephone Encounter (Signed)
New Message  Pt stated taht since she has been off Losartan - for controlling her BP- she has been retaining a lot of water- wanted to know if she can go back on until next OV w/ Caryl Comes. Please call back and discuss.

## 2015-06-12 NOTE — Telephone Encounter (Signed)
Nell Range PA suggest patient take Lasix 20 mg by mouth daily until her appointment with Dr. Caryl Comes. Informed patient to not drink wine for now. Patient will follow-up with Dr. Caryl Comes on 5/9. Patient verbalized understanding.

## 2015-06-12 NOTE — Telephone Encounter (Signed)
Returned patient's call. Patient is complaining of swelling and decreased urine output. Patient is out of town right now, and has been eating out, but trying to watch her salt in take. Patient BP 123/77 and HR 74. Patient also wants to know if she can drink wine. Will consult FLEX/DOD.

## 2015-06-18 ENCOUNTER — Encounter: Payer: Self-pay | Admitting: Physician Assistant

## 2015-06-18 ENCOUNTER — Ambulatory Visit (INDEPENDENT_AMBULATORY_CARE_PROVIDER_SITE_OTHER): Payer: BC Managed Care – PPO | Admitting: Physician Assistant

## 2015-06-18 ENCOUNTER — Other Ambulatory Visit (INDEPENDENT_AMBULATORY_CARE_PROVIDER_SITE_OTHER): Payer: BC Managed Care – PPO | Admitting: *Deleted

## 2015-06-18 VITALS — BP 145/90 | HR 80 | Ht 65.0 in | Wt 251.4 lb

## 2015-06-18 DIAGNOSIS — R002 Palpitations: Secondary | ICD-10-CM | POA: Diagnosis not present

## 2015-06-18 DIAGNOSIS — N289 Disorder of kidney and ureter, unspecified: Secondary | ICD-10-CM

## 2015-06-18 DIAGNOSIS — E8770 Fluid overload, unspecified: Secondary | ICD-10-CM | POA: Diagnosis not present

## 2015-06-18 LAB — BASIC METABOLIC PANEL
BUN: 17 mg/dL (ref 7–25)
CO2: 22 mmol/L (ref 20–31)
Calcium: 9.6 mg/dL (ref 8.6–10.4)
Chloride: 104 mmol/L (ref 98–110)
Creat: 0.87 mg/dL (ref 0.50–0.99)
Glucose, Bld: 93 mg/dL (ref 65–99)
Potassium: 4.3 mmol/L (ref 3.5–5.3)
Sodium: 138 mmol/L (ref 135–146)

## 2015-06-18 MED ORDER — FUROSEMIDE 20 MG PO TABS: 20.0000 mg | ORAL_TABLET | Freq: Every day | ORAL | Status: DC | PRN

## 2015-06-18 MED ORDER — LOSARTAN POTASSIUM-HCTZ 100-12.5 MG PO TABS: 1.0000 | ORAL_TABLET | Freq: Every day | ORAL | Status: DC

## 2015-06-18 NOTE — Patient Instructions (Addendum)
Medication Instructions:   TAKE LASIX 40 MG ONCE A DAY FOR 2 DAYS  THEN TAKE ONLY AS NEEDED FOR EDEMA AND SWELLING  IF YOUR BLOOD PRESSURE RUNS LOWER THAN 110 OR LESS TOP NUMBER OR EXPERIENCE LIGHTHEADENESS OR DIZZINESS  YOU MAY TAKE AN HALF TABLET OF LOSARTAN HCTZ   If you need a refill on your cardiac medications before your next appointment, please call your pharmacy.  LABWORK:  BMET TODAY   FOLLOW UP : WITH DR Caryl Comes AS SCHEDULED   Any Other Special Instructions Will Be Listed Below (If Applicable).  LIMIT YOUR WATER INTAKE TO 2 LITERS A DAY   Low-Sodium Eating Plan Sodium raises blood pressure and causes water to be held in the body. Getting less sodium from food will help lower your blood pressure, reduce any swelling, and protect your heart, liver, and kidneys. We get sodium by adding salt (sodium chloride) to food. Most of our sodium comes from canned, boxed, and frozen foods. Restaurant foods, fast foods, and pizza are also very high in sodium. Even if you take medicine to lower your blood pressure or to reduce fluid in your body, getting less sodium from your food is important.  WHAT IS MY PLAN?  Your health care provider recommends that you limit your sodium intake to _2000MG _________ a day.   WHAT DO I NEED TO KNOW ABOUT THIS EATING PLAN? For the low-sodium eating plan, you will follow these general guidelines:  Choose foods with a % Daily Value for sodium of less than 5% (as listed on the food label).   Use salt-free seasonings or herbs instead of table salt or sea salt.   Check with your health care provider or pharmacist before using salt substitutes.   Eat fresh foods.  Eat more vegetables and fruits.  Limit canned vegetables. If you do use them, rinse them well to decrease the sodium.   Limit cheese to 1 oz (28 g) per day.   Eat lower-sodium products, often labeled as "lower sodium" or "no salt added."  Avoid foods that contain monosodium glutamate  (MSG). MSG is sometimes added to [place] food and some canned foods.  Check food labels (Nutrition Facts labels) on foods to learn how much sodium is in one serving.  Eat more home-cooked food and less restaurant, buffet, and fast food.  When eating at a restaurant, ask that your food be prepared with less salt, or no salt if possible.  HOW DO I READ FOOD LABELS FOR SODIUM INFORMATION? The Nutrition Facts label lists the amount of sodium in one serving of the food. If you eat more than one serving, you must multiply the listed amount of sodium by the number of servings. Food labels may also identify foods as:  Sodium free--Less than 5 mg in a serving.  Very low sodium--35 mg or less in a serving.  Low sodium--140 mg or less in a serving.  Light in sodium--50% less sodium in a serving. For example, if a food that usually has 300 mg of sodium is changed to become light in sodium, it will have 150 mg of sodium.  Reduced sodium--25% less sodium in a serving. For example, if a food that usually has 400 mg of sodium is changed to reduced sodium, it will have 300 mg of sodium. WHAT FOODS CAN I EAT? Grains Low-sodium cereals, including oats, puffed wheat and rice, and shredded wheat cereals. Low-sodium crackers. Unsalted rice and pasta. Lower-sodium bread.  Vegetables Frozen or fresh vegetables. Low-sodium or  reduced-sodium canned vegetables. Low-sodium or reduced-sodium tomato sauce and paste. Low-sodium or reduced-sodium tomato and vegetable juices.  Fruits Fresh, frozen, and canned fruit. Fruit juice.  Meat and Other Protein Products Low-sodium canned tuna and salmon. Fresh or frozen meat, poultry, seafood, and fish. Lamb. Unsalted nuts. Dried beans, peas, and lentils without added salt. Unsalted canned beans. Homemade soups without salt. Eggs.  Dairy Milk. Soy milk. Ricotta cheese. Low-sodium or reduced-sodium cheeses. Yogurt.  Condiments Fresh and dried herbs and spices.  Salt-free seasonings. Onion and garlic powders. Low-sodium varieties of mustard and ketchup. Fresh or refrigerated horseradish. Lemon juice.  Fats and Oils Reduced-sodium salad dressings. Unsalted butter.  Other Unsalted popcorn and pretzels.  The items listed above may not be a complete list of recommended foods or beverages. Contact your dietitian for more options. WHAT FOODS ARE NOT RECOMMENDED? Grains Instant hot cereals. Bread stuffing, pancake, and biscuit mixes. Croutons. Seasoned rice or pasta mixes. Noodle soup cups. Boxed or frozen macaroni and cheese. Self-rising flour. Regular salted crackers. Vegetables Regular canned vegetables. Regular canned tomato sauce and paste. Regular tomato and vegetable juices. Frozen vegetables in sauces. Salted [place] fries. Olives. Angie Fava. Relishes. Sauerkraut. Salsa. Meat and Other Protein Products Salted, canned, smoked, spiced, or pickled meats, seafood, or fish. Bacon, ham, sausage, hot dogs, corned beef, chipped beef, and packaged luncheon meats. Salt pork. Jerky. Pickled herring. Anchovies, regular canned tuna, and sardines. Salted nuts. Dairy Processed cheese and cheese spreads. Cheese curds. Blue cheese and cottage cheese. Buttermilk.  Condiments Onion and garlic salt, seasoned salt, table salt, and sea salt. Canned and packaged gravies. Worcestershire sauce. Tartar sauce. Barbecue sauce. Teriyaki sauce. Soy sauce, including reduced sodium. Steak sauce. Fish sauce. Oyster sauce. Cocktail sauce. Horseradish that you find on the shelf. Regular ketchup and mustard. Meat flavorings and tenderizers. Bouillon cubes. Hot sauce. Tabasco sauce. Marinades. Taco seasonings. Relishes. Fats and Oils Regular salad dressings. Salted butter. Margarine. Ghee. Bacon fat.  Other Potato and tortilla chips. Corn chips and puffs. Salted popcorn and pretzels. Canned or dried soups. Pizza. Frozen entrees and pot pies.  The items listed above may not be a  complete list of foods and beverages to avoid. Contact your dietitian for more information.   This information is not intended to replace advice given to you by your health care provider. Make sure you discuss any questions you have with your health care provider.   Document Released: 07/18/2001 Document Revised: 02/16/2014 Document Reviewed: 11/30/2012 Elsevier Interactive Patient Education 2016 [employer] ENSURE TWICE A DAY

## 2015-06-18 NOTE — Progress Notes (Signed)
Cardiology Office Note   Date:  06/18/2015   ID:  Connie Haynes, DOB 01-08-1956, MRN [id number]  PCP:  Ival Bible, MD  Cardiologist:  Dr Suzie Portela, PA-C   No chief complaint on file.   History of Present Illness: RABECCA Haynes is a 60 y.o. female with a history of WPW, HTN, HLD, IBS, OA.   D/c 04/07 after L shoulder surgery. Before and surgery, low BP>>losartan stopped. Pt retaining water>>Lasix 20 mg qd  LACIANA PICKER presents for management of her blood pressure and volume issues  Ms Connie Haynes has not had chest pain or palpitations.  Her blood pressure was low before she went in the hospital and she was not urinating that often. Then she had shoulder surgery, and her blood pressure was low while she was in the hospital. She remembers being given multiple bags of IV fluid to get her blood pressure up. Her creatinine was up, losartan was discontinued.  After discharge, her blood pressure remained low. She was working to recover from surgery and get stronger. Gradually, her blood pressure increased and eventually became elevated, and size 173/95. She put herself back on losartan.  The edema did not improve and called our office. We started her on Lasix at 20 mg a day.  Since being on the Lasix, her edema has improved. She has not been on potassium supplement but was drinking and Ensure because it has a decent amount of potassium in it and she does not eat bananas. She has not had leg cramping or any other problems. She drinks well over 2 L daily, mostly water.  She is concerned about her blood pressure and about her kidney function. She also was worried because the volume has improved, but she is not back to baseline.   Past Medical History  Diagnosis Date  . Wolff-Parkinson-White (WPW) syndrome   . Hypertension   . Hyperlipidemia   . IBS (irritable bowel syndrome)     controlled with med  . PONV (postoperative nausea and vomiting)     " woke up to soon"  . Cough      hx. dry cough chronic  . Disorder of tear duct system     frequent tearing of eyes  . Osteoarthritis     arthritis -joints-limited ROM shoulders more on right  . Anxiety   . History of kidney stones   . Basal cell carcinoma     eye lid  . H/O: whooping cough 2000  . Sleep apnea     no longer using cpap  . History of kidney infection 2015    ? sepsis  . UTI (lower urinary tract infection)   . History of kidney stones   . GERD (gastroesophageal reflux disease)   . Anemia     denies    Past Surgical History  Procedure Laterality Date  . Tear duct surgery    . Cervical spine surgery      cervical fusion with hardware retained  . Total abdominal hysterectomy    . Total hip arthroplasty      x 3(rt x2, lt x1)  . Ablation surgery      1993 [facility] (Dr. Hulen Skains)  . Eyelid carcinoma excision    . Joint replacement    . Total shoulder arthroplasty Left 07/15/2012    Procedure: LEFT TOTAL SHOULDER ARTHROPLASTY;  Surgeon: Augustin Schooling, MD;  Location: [place];  Service: Orthopedics;  Laterality: Left;  . Cardiac electrophysiology study  and ablation      '93 for WPW syndrome  . Total knee arthroplasty Right 08/08/2013    Procedure: RIGHT TOTAL KNEE ARTHROPLASTY WITH SAPHENOUS NERVE SCAR EXCISION;  Surgeon: Mauri Pole, MD;  [facility];  Service: Orthopedics;  Laterality: Right;  . Ureteroscopy  stent placement  . Cystoscopy/retrograde/ureteroscopy Left 12/18/2013    Procedure: CYSTOSCOPY/RETROGRADE/LEFT STENT;  Surgeon: Malka So, MD;  Location: [facility];  Service: Urology;  Laterality: Left;  . Esophagogastroduodenoscopy (egd) with propofol N/A 05/10/2014    Procedure: ESOPHAGOGASTRODUODENOSCOPY (EGD) WITH PROPOFOL;  Surgeon: Juanita Craver, MD;  [facility];  Service: Endoscopy;  Laterality: N/A;  . Colonoscopy with propofol N/A 05/10/2014    Procedure: COLONOSCOPY WITH PROPOFOL;  Surgeon: Juanita Craver, MD;  [facility];  Service: Endoscopy;  Laterality:  N/A;  . Reverse shoulder arthroplasty Right 06/29/2014    Procedure: REVERSE TOTAL SHOULDER ARTHROPLASTY;  Surgeon: Netta Cedars, MD;  Location: [place];  Service: Orthopedics;  Laterality: Right;  . Appendectomy    . Total shoulder revision Right 12/21/2014    Procedure: RIGHT SHOULDER POLY EXCHANGE ;  Surgeon: Netta Cedars, MD;  Location: [place];  Service: Orthopedics;  Laterality: Right;  . Carpal tunnel release Right 12/21/2014    Procedure: RIGHT CARPAL TUNNEL RELEASE ;  Surgeon: Netta Cedars, MD;  Location: [place];  Service: Orthopedics;  Laterality: Right;  . Carpal tunnel release Left   . Colon surgery      "colon detached from abdomen"  . Revision total shoulder to reverse total shoulder Left 05/15/2015  . Revision total shoulder to reverse total shoulder Left 05/15/2015    Procedure: LEFT SHOULDER REVISION TOTAL SHOULDER ARTHRPLASTY TO REVERSE TOTAL SHOULDER ARTHROPLASTY ;  Surgeon: Netta Cedars, MD;  Location: [place];  Service: Orthopedics;  Laterality: Left;    Current Outpatient Prescriptions  Medication Sig Dispense Refill  . AMITIZA 24 MCG capsule Take 24 mg by mouth 2 (two) times daily.    Marland Kitchen amLODipine (NORVASC) 5 MG tablet Take 1 tablet (5 mg total) by mouth daily. 30 tablet 11  . carvedilol (COREG) 25 MG tablet Take 1 tablet (25 mg total) by mouth 2 (two) times daily. (Patient taking differently: Take 12.5 mg by mouth 2 (two) times daily. ) 60 tablet 11  . celecoxib (CELEBREX) 200 MG capsule Take 200 mg by mouth 2 (two) times daily.    . cholecalciferol (VITAMIN D) 1000 UNITS tablet Take 1,000 Units by mouth daily.    Marland Kitchen CRANBERRY PO Take 1 tablet by mouth daily.    Marland Kitchen esomeprazole (NEXIUM) 20 MG capsule Take 20 mg by mouth daily as needed (indigestion.).    Marland Kitchen furosemide (LASIX) 20 MG tablet Take 1 tablet (20 mg total) by mouth daily. 10 tablet 0  . metaxalone (SKELAXIN) 800 MG tablet Take 1 tablet (800 mg total) by mouth 4 (four) times daily as needed for muscle spasms. 60 tablet 1   . Multiple Vitamin (MULTIVITAMIN) tablet Take 1 tablet by mouth every morning.     Marland Kitchen oxyCODONE-acetaminophen (ROXICET) 5-325 MG tablet Take 1-2 tablets by mouth every 4 (four) hours as needed for severe pain. 60 tablet 0  . promethazine (PHENERGAN) 25 MG tablet Take 1 tablet (25 mg total) by mouth every 6 (six) hours as needed for nausea or vomiting. 60 tablet 0  . rOPINIRole (REQUIP) 4 MG tablet Take 4 mg by mouth at bedtime.      No current facility-administered medications for this visit.  Facility-Administered Medications Ordered in Other Visits  Medication Dose Route Frequency Provider Last Rate Last Dose  . chlorhexidine (HIBICLENS) 4 % liquid 4 application  60 mL Topical Once Kellogg, PA-C        Allergies:   Codeine and Hydrocodone    Social History:  The patient  reports that she has never smoked. She has never used smokeless tobacco. She reports that she does not drink alcohol or use illicit drugs.   Family History:  The patient's family history includes Cancer in her maternal uncle and paternal grandfather; Heart attack in her maternal grandfather and mother; Heart disease in her maternal grandfather and mother; Heart failure in her mother; Hyperlipidemia in her mother; Hypertension in her mother; Leukemia in her brother; Lung cancer in her paternal grandfather.    ROS:  Please see the history of present illness. All other systems are reviewed and negative.    PHYSICAL EXAM: VS:  BP 145/90 mmHg  Pulse 80  Ht 5\' 5"  (1.651 m)  Wt 251 lb 6.4 oz (114.034 kg)  BMI 41.84 kg/m2 , BMI Body mass index is 41.84 kg/(m^2). GEN: Well nourished, well developed, female in no acute distress HEENT: normal for age  Neck: Minimal JVD, positive hepatojugular reflux, no carotid bruit, no masses Cardiac: RRR; no murmur, no rubs, or gallops Respiratory:  Decreased breath sounds bases bilaterally, normal work of breathing GI: soft, nontender, nondistended, + BS MS: no deformity or  atrophy; trace edema; distal pulses are 2+ in all 4 extremities  Skin: warm and dry, no rash Neuro:  Strength and sensation are intact Psych: euthymic mood, full affect   EKG:  EKG is ordered today. The ekg ordered today demonstrates sinus bradycardia, heart rate 59, no change from 06/14/2014.   Recent Labs: 05/16/2015: Hemoglobin 11.0*; Platelets 275 05/17/2015: BUN 22*; Creatinine, Ser 2.11*; Potassium 5.0; Sodium 133*    Lipid Panel    Component Value Date/Time   CHOL 217* 05/23/2012 1601   TRIG 77 05/23/2012 1601   HDL 66 05/23/2012 1601   CHOLHDL 3.3 05/23/2012 1601   VLDL 15 05/23/2012 1601   LDLCALC 136* 05/23/2012 1601     Wt Readings from Last 3 Encounters:  06/18/15 251 lb 6.4 oz (114.034 kg)  05/15/15 242 lb 13 oz (110.139 kg)  05/07/15 242 lb 12.8 oz (110.133 kg)     Other studies Reviewed: Additional studies/ records that were reviewed today include: Office notes, hospital records and previous testing.  ASSESSMENT AND PLAN:  1.  Volume overload: She was not having a problem with dyspnea on exertion until she had problems with hypotension in the hospital and got a lot of extra fluids. She does not eat a diet that is high in sodium, but admits to drinking a great deal of fluid. She is asked to limit the fluid to 2 L daily. When she was on vacation, she probably got extra sodium which contributed to the volume overload.  She is to increase her Lasix up to 40 mg daily for 2 days, and then take it on a when necessary basis only. If her symptoms do not improve, we can do an echocardiogram to look for diastolic dysfunction and further adjust blood pressure medications.  2. Hypertension: I believe that her blood pressure will improve once her volume status is normalized. If not, she can be started on hydralazine or the Norvasc can be increased.  3. Acute renal insufficiency: Prior to admission, her BUN and creatinine were mildly elevated at  16/1.20. On admission, they were  16/1.9, and when checked later they were 22/2.11. She has not had labs done since then. We will draw a BMET today and further evaluation will depend on the results.  4. Morbid obesity: She is encouraged to eat a low sodium heart healthy diet and increase her activity as tolerated.   Current medicines are reviewed at length with the patient today.  The patient has concerns regarding medicines.  The following changes have been made:  Increase Lasix for 2 days and then when necessary only  Labs/ tests ordered today include: BMET   Orders Placed This Encounter  Procedures  . EKG 12-Lead     Disposition:   FU with Dr. Caryl Comes, repeat BMET in one month  Signed, Lenoard Aden  06/18/2015 1:39 PM    [place] Phone: [phone]; Fax: [phone]  This note was written with the assistance of speech recognition software. Please excuse any transcriptional errors.

## 2015-06-24 DIAGNOSIS — R251 Tremor, unspecified: Secondary | ICD-10-CM | POA: Insufficient documentation

## 2015-06-24 DIAGNOSIS — R29898 Other symptoms and signs involving the musculoskeletal system: Secondary | ICD-10-CM | POA: Insufficient documentation

## 2015-06-24 DIAGNOSIS — M545 Low back pain, unspecified: Secondary | ICD-10-CM | POA: Insufficient documentation

## 2015-07-03 ENCOUNTER — Emergency Department (HOSPITAL_BASED_OUTPATIENT_CLINIC_OR_DEPARTMENT_OTHER): Payer: BC Managed Care – PPO

## 2015-07-03 ENCOUNTER — Emergency Department (HOSPITAL_BASED_OUTPATIENT_CLINIC_OR_DEPARTMENT_OTHER)
Admission: EM | Admit: 2015-07-03 | Discharge: 2015-07-03 | Disposition: A | Discharge status: Home / Self Care | Payer: BC Managed Care – PPO | Attending: Emergency Medicine | Admitting: Emergency Medicine

## 2015-07-03 ENCOUNTER — Encounter (HOSPITAL_BASED_OUTPATIENT_CLINIC_OR_DEPARTMENT_OTHER): Payer: Self-pay | Admitting: Emergency Medicine

## 2015-07-03 DIAGNOSIS — S39012A Strain of muscle, fascia and tendon of lower back, initial encounter: Secondary | ICD-10-CM | POA: Diagnosis not present

## 2015-07-03 DIAGNOSIS — M19011 Primary osteoarthritis, right shoulder: Secondary | ICD-10-CM | POA: Diagnosis not present

## 2015-07-03 DIAGNOSIS — M545 Low back pain: Secondary | ICD-10-CM | POA: Diagnosis present

## 2015-07-03 DIAGNOSIS — Y939 Activity, unspecified: Secondary | ICD-10-CM | POA: Diagnosis not present

## 2015-07-03 DIAGNOSIS — Z8673 Personal history of transient ischemic attack (TIA), and cerebral infarction without residual deficits: Secondary | ICD-10-CM | POA: Diagnosis not present

## 2015-07-03 DIAGNOSIS — Y999 Unspecified external cause status: Secondary | ICD-10-CM | POA: Diagnosis not present

## 2015-07-03 DIAGNOSIS — I10 Essential (primary) hypertension: Secondary | ICD-10-CM | POA: Insufficient documentation

## 2015-07-03 DIAGNOSIS — Z79899 Other long term (current) drug therapy: Secondary | ICD-10-CM | POA: Diagnosis not present

## 2015-07-03 DIAGNOSIS — E785 Hyperlipidemia, unspecified: Secondary | ICD-10-CM | POA: Diagnosis not present

## 2015-07-03 DIAGNOSIS — Y92410 Unspecified street and highway as the place of occurrence of the external cause: Secondary | ICD-10-CM | POA: Insufficient documentation

## 2015-07-03 DIAGNOSIS — S161XXA Strain of muscle, fascia and tendon at neck level, initial encounter: Secondary | ICD-10-CM | POA: Insufficient documentation

## 2015-07-03 HISTORY — DX: Cerebral infarction, unspecified: I63.9

## 2015-07-03 MED ORDER — OXYCODONE-ACETAMINOPHEN 5-325 MG PO TABS: 1.0000 | ORAL_TABLET | Freq: Four times a day (QID) | ORAL | Status: DC | PRN

## 2015-07-03 MED FILL — OXYCODONE/APAP 5-325: 5-325 | 2 days supply | Qty: 15 | Fill #0
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
## 2015-08-06 ENCOUNTER — Encounter: Payer: Self-pay | Admitting: Internal Medicine

## 2015-08-06 ENCOUNTER — Ambulatory Visit (INDEPENDENT_AMBULATORY_CARE_PROVIDER_SITE_OTHER): Payer: BC Managed Care – PPO | Admitting: Internal Medicine

## 2015-08-06 VITALS — BP 116/74 | HR 70 | Ht 65.0 in | Wt 249.2 lb

## 2015-08-06 DIAGNOSIS — I456 Pre-excitation syndrome: Secondary | ICD-10-CM

## 2015-08-06 DIAGNOSIS — R002 Palpitations: Secondary | ICD-10-CM | POA: Diagnosis not present

## 2015-08-06 DIAGNOSIS — R0789 Other chest pain: Secondary | ICD-10-CM

## 2015-08-06 MED ORDER — FUROSEMIDE 20 MG PO TABS
ORAL_TABLET | ORAL | Status: DC
Start: 2015-08-06 — End: 2019-08-29

## 2015-08-06 NOTE — Patient Instructions (Addendum)
Medication Instructions: - Your physician has recommended you make the following change in your medication:  1) Stop amlodipine 2) Change lasix (furosemide) to 20 mg two tablets (40 mg) by mouth daily as needed for swelling.  Labwork: - none  Procedures/Testing: - Your physician has requested that you have a lexiscan myoview. For further information please visit [email or web address]. Please follow instruction sheet, as given.  Follow-Up: - Dr. Caryl Comes will see you back on an as needed basis.  Any Additional Special Instructions Will Be Listed Below (If Applicable).     If you need a refill on your cardiac medications before your next appointment, please call your pharmacy.

## 2015-08-06 NOTE — Progress Notes (Signed)
Patient Care Team: [name], MD as PCP - General (Family Medicine) Elsie Stain, MD as Attending Physician (Pulmonary Disease)   HPI  Connie Haynes is a 60 y.o. female Seen in followup for hypertension;  there apparently have been issues with her family doctor being frustrated about the management of her blood pressure and wanting [place] to do it.   Her blood pressure was low before she went in the hospital and she was not urinating that often. Then she had shoulder surgery, and her blood pressure was low while she was in the hospital. She remembers being given multiple bags of IV fluid to get her blood pressure up. Her creatinine was up, losartan was discontinued.  After discharge, her blood pressure remained low. She was working to recover from surgery and get stronger. Gradually, her blood pressure increased and eventually became elevated, and size 173/95. She put herself back on losartan.  The edema did not improve and called our office. We started her on Lasix at 20 mg a day.  Since being on the Lasix, her edema has improved.   She's concerned about her urine output. Her by mouth intake is 1-1.5 L daily; she feels like her urine output is inadequate. The color is relatively light yellow. She was told because of her volume overload when she was seen by the PA to decrease her fluid intake.  She was also started on amlodipine for blood pressures into the 170s. She is now having blood pressures in the 80s--130s  She has a history of ablation for WPW with recurrent palpitations.  Myoview scanning was normal 9/15;  sleep study was abnormal 11/15 with an AHI of 39 she saw Dr. Gwenette Greet. Unfortunately  CPAP had no symptomatic benefit. Her blood pressure remains well-controlled.            Past Medical History  Diagnosis Date  . Wolff-Parkinson-White (WPW) syndrome   . Hypertension   . Hyperlipidemia   . IBS (irritable bowel syndrome)     controlled with med  . PONV  (postoperative nausea and vomiting)     " woke up to soon"  . Cough     hx. dry cough chronic  . Disorder of tear duct system     frequent tearing of eyes  . Osteoarthritis     arthritis -joints-limited ROM shoulders more on right  . Anxiety   . History of kidney stones   . Basal cell carcinoma     eye lid  . H/O: whooping cough 2000  . Sleep apnea     no longer using cpap  . History of kidney infection 2015    ? sepsis  . UTI (lower urinary tract infection)   . History of kidney stones   . GERD (gastroesophageal reflux disease)   . Anemia     denies  . Stroke [facility])     Past Surgical History  Procedure Laterality Date  . Tear duct surgery    . Cervical spine surgery      cervical fusion with hardware retained  . Total abdominal hysterectomy    . Total hip arthroplasty      x 3(rt x2, lt x1)  . Ablation surgery      1993 [facility] (Dr. Hulen Skains)  . Eyelid carcinoma excision    . Joint replacement    . Total shoulder arthroplasty Left 07/15/2012    Procedure: LEFT TOTAL SHOULDER ARTHROPLASTY;  Surgeon: Augustin Schooling, MD;  Location: [place];  Service: Orthopedics;  Laterality: Left;  . Cardiac electrophysiology study and ablation      '93 for WPW syndrome  . Total knee arthroplasty Right 08/08/2013    Procedure: RIGHT TOTAL KNEE ARTHROPLASTY WITH SAPHENOUS NERVE SCAR EXCISION;  Surgeon: Mauri Pole, MD;  [facility];  Service: Orthopedics;  Laterality: Right;  . Ureteroscopy  stent placement  . Cystoscopy/retrograde/ureteroscopy Left 12/18/2013    Procedure: CYSTOSCOPY/RETROGRADE/LEFT STENT;  Surgeon: Malka So, MD;  [facility];  Service: Urology;  Laterality: Left;  . Esophagogastroduodenoscopy (egd) with propofol N/A 05/10/2014    Procedure: ESOPHAGOGASTRODUODENOSCOPY (EGD) WITH PROPOFOL;  Surgeon: Juanita Craver, MD;  [facility];  Service: Endoscopy;  Laterality: N/A;  . Colonoscopy with propofol N/A 05/10/2014    Procedure: COLONOSCOPY WITH  PROPOFOL;  Surgeon: Juanita Craver, MD;  [facility];  Service: Endoscopy;  Laterality: N/A;  . Reverse shoulder arthroplasty Right 06/29/2014    Procedure: REVERSE TOTAL SHOULDER ARTHROPLASTY;  Surgeon: Netta Cedars, MD;  Location: [place];  Service: Orthopedics;  Laterality: Right;  . Appendectomy    . Total shoulder revision Right 12/21/2014    Procedure: RIGHT SHOULDER POLY EXCHANGE ;  Surgeon: Netta Cedars, MD;  Location: [place];  Service: Orthopedics;  Laterality: Right;  . Carpal tunnel release Right 12/21/2014    Procedure: RIGHT CARPAL TUNNEL RELEASE ;  Surgeon: Netta Cedars, MD;  Location: [place];  Service: Orthopedics;  Laterality: Right;  . Carpal tunnel release Left   . Colon surgery      "colon detached from abdomen"  . Revision total shoulder to reverse total shoulder Left 05/15/2015  . Revision total shoulder to reverse total shoulder Left 05/15/2015    Procedure: LEFT SHOULDER REVISION TOTAL SHOULDER ARTHRPLASTY TO REVERSE TOTAL SHOULDER ARTHROPLASTY ;  Surgeon: Netta Cedars, MD;  Location: [place];  Service: Orthopedics;  Laterality: Left;    Current Outpatient Prescriptions  Medication Sig Dispense Refill  . AMITIZA 24 MCG capsule Take 24 mg by mouth 2 (two) times daily.    Marland Kitchen amLODipine (NORVASC) 5 MG tablet Take 1 tablet (5 mg total) by mouth daily. 30 tablet 11  . carvedilol (COREG) 25 MG tablet Take 12.5 mg by mouth 2 (two) times daily.    . celecoxib (CELEBREX) 200 MG capsule Take 200 mg by mouth 2 (two) times daily.    . cholecalciferol (VITAMIN D) 1000 UNITS tablet Take 1,000 Units by mouth daily.    Marland Kitchen CRANBERRY PO Take 1 tablet by mouth daily.    Marland Kitchen esomeprazole (NEXIUM) 20 MG capsule Take 20 mg by mouth daily as needed (indigestion.).    Marland Kitchen furosemide (LASIX) 20 MG tablet Take 1 tablet (20 mg total) by mouth daily as needed. AS NEEDED FOR EDEMA AND SWELLING. 30 tablet 5  . losartan-hydrochlorothiazide (HYZAAR) 100-12.5 MG tablet Take 1 tablet by mouth daily. IF  LIGHTHEADEDNESS OR  DIZZINESS AND BLOOD PRESSURE RUNS SYSTOLIC A999333 OR LESS CUT IN HALF 90 tablet 5  . metaxalone (SKELAXIN) 800 MG tablet Take 1 tablet (800 mg total) by mouth 4 (four) times daily as needed for muscle spasms. 60 tablet 1  . Multiple Vitamin (MULTIVITAMIN) tablet Take 1 tablet by mouth every morning.     Marland Kitchen oxyCODONE-acetaminophen (PERCOCET) 5-325 MG tablet Take 1-2 tablets by mouth every 6 (six) hours as needed. 15 tablet 0  . promethazine (PHENERGAN) 25 MG tablet Take 1 tablet (25 mg total) by mouth every 6 (six) hours as needed for  nausea or vomiting. 60 tablet 0  . rOPINIRole (REQUIP) 4 MG tablet Take 4 mg by mouth at bedtime.      No current facility-administered medications for this visit.   Facility-Administered Medications Ordered in Other Visits  Medication Dose Route Frequency Provider Last Rate Last Dose  . chlorhexidine (HIBICLENS) 4 % liquid 4 application  60 mL Topical Once Brad Dixon, PA-C        Allergies  Allergen Reactions  . Codeine Nausea And Vomiting  . Hydrocodone Nausea Only    Review of Systems negative except from HPI and PMH  Physical Exam BP 116/74 mmHg  Pulse 70  Ht 5\' 5"  (1.651 m)  Wt 249 lb 3.2 oz (113.036 kg)  BMI 41.47 kg/m2 Well developed and well nourished in no acute distress HENT normal E scleral and icterus clear Neck Supple JVP flat; carotids brisk and full Clear to ausculation  Regular rate and rhythm, no murmurs gallops or rub Soft with active bowel sounds No clubbing cyanosis  Edema Alert and oriented, grossly normal motor and sensory function Skin Warm and Dry superficial bruising      Assessment and  Plan  Hypertension  Palpitations  Sleep apnea\    We will have her discontinue her amlodipine.  We will change her furosemide to 40 as needed.  She will continue her fluid restriction so as to minimize her propensity towards edema.  We will have her follow-up with her PCP we will see her as needed

## 2015-08-19 ENCOUNTER — Telehealth (HOSPITAL_COMMUNITY): Payer: Self-pay | Admitting: *Deleted

## 2015-08-19 NOTE — Telephone Encounter (Signed)
Patient given detailed instructions per Myocardial Perfusion Study Information Sheet for the test on 08/21/15. Patient notified to arrive 15 minutes early and that it is imperative to arrive on time for appointment to keep from having the test rescheduled.  If you need to cancel or reschedule your appointment, please call the office within 24 hours of your appointment. Failure to do so may result in a cancellation of your appointment, and a $50 no show fee. Patient verbalized understanding. Geoge Lawrance J Devory Mckinzie, RN  

## 2015-08-21 ENCOUNTER — Ambulatory Visit (HOSPITAL_COMMUNITY): Payer: BC Managed Care – PPO | Attending: Cardiology

## 2015-08-21 DIAGNOSIS — R002 Palpitations: Secondary | ICD-10-CM | POA: Insufficient documentation

## 2015-08-21 DIAGNOSIS — R9439 Abnormal result of other cardiovascular function study: Secondary | ICD-10-CM | POA: Insufficient documentation

## 2015-08-21 DIAGNOSIS — I10 Essential (primary) hypertension: Secondary | ICD-10-CM | POA: Insufficient documentation

## 2015-08-21 DIAGNOSIS — R0789 Other chest pain: Secondary | ICD-10-CM | POA: Diagnosis present

## 2015-08-21 MED ORDER — TECHNETIUM TC 99M TETROFOSMIN IV KIT
31.6000 | PACK | Freq: Once | INTRAVENOUS | Status: AC | PRN
  Administered 2015-08-21: 32 via INTRAVENOUS
  Filled 2015-08-21: qty 32

## 2015-08-21 MED ORDER — REGADENOSON 0.4 MG/5ML IV SOLN
0.4000 mg | Freq: Once | INTRAVENOUS | Status: AC
  Administered 2015-08-21: 0.4 mg via INTRAVENOUS

## 2015-08-22 ENCOUNTER — Ambulatory Visit (HOSPITAL_COMMUNITY): Payer: BC Managed Care – PPO | Attending: Internal Medicine

## 2015-08-22 LAB — MYOCARDIAL PERFUSION IMAGING
LV dias vol: 82 mL (ref 46–106)
LV sys vol: 19 mL
Peak HR: 88 {beats}/min
RATE: 0.31
Rest HR: 59 {beats}/min
SDS: 3
SRS: 9
SSS: 12
TID: 0.85

## 2015-08-22 MED ORDER — TECHNETIUM TC 99M TETROFOSMIN IV KIT
31.7000 | PACK | Freq: Once | INTRAVENOUS | Status: AC | PRN
  Filled 2015-08-22: qty 32

## 2015-08-28 ENCOUNTER — Encounter: Payer: Self-pay | Admitting: Internal Medicine

## 2016-01-16 DIAGNOSIS — M47816 Spondylosis without myelopathy or radiculopathy, lumbar region: Secondary | ICD-10-CM | POA: Insufficient documentation

## 2016-01-16 DIAGNOSIS — R2689 Other abnormalities of gait and mobility: Secondary | ICD-10-CM | POA: Insufficient documentation

## 2016-01-16 DIAGNOSIS — M5116 Intervertebral disc disorders with radiculopathy, lumbar region: Secondary | ICD-10-CM | POA: Insufficient documentation

## 2016-07-16 HISTORY — PX: KNEE ARTHROSCOPY: SUR90

## 2016-07-31 ENCOUNTER — Encounter (HOSPITAL_COMMUNITY): Payer: Self-pay | Admitting: *Deleted

## 2016-08-13 NOTE — Progress Notes (Signed)
Cardiology Office Note Date:  08/14/2016  Patient ID:  Connie Haynes 1955-05-09, MRN [id number] PCP:  [name], MD  Cardiologist:  Dr. Caryl Comes    Chief Complaint: surgical clearance   History of Present Illness: Connie Haynes is a 61 y.o. female with history of WPW s/p ablation, HTN, HLD, IBS, OA, adrenal insufficiency follows with Dr. Tamala Julian (endocrinologist at [facility], also managing steroids per-op).   She comes in today to be seen for dr. Caryl Comes for pre-op clearance pending knee surgery.  She was last seen by Dr. Caryl Comes in June 2017, she had back then recently had issues with BP peri-operatively for shoulder surgery, notes report: "Her blood pressure was low before she went in the hospital and she was not urinating that often. Then she had shoulder surgery, and her blood pressure was low while she was in the hospital. She remembers being given multiple bags of IV fluid to get her blood pressure up. Her creatinine was up, losartan was discontinued. After discharge, her blood pressure remained low. She was working to recover from surgery and get stronger. Gradually, her blood pressure increased and eventually became elevated, and size 173/95. She put herself back on losartan. The edema did not improve and called our office. We started her on Lasix at 20 mg a day. Since being on the Lasix, her edema has improved.  She was concerned about her urine output. Her by mouth intake is 1-1.5 L daily; she feels like her urine output is inadequate. The color is relatively light yellow. She was told because of her volume overload when she was seen by the PA to decrease her fluid intake. She was also started on amlodipine for blood pressures into the 170s. She is now having blood pressures in the 80s--130s"  Dr. Caryl Comes discontinued her amlodipine, made her lasix 40mg  PRN for edema, re-enforced fluid recstriction given propensity to edema and further f/u to be with her PMD.  The patient comes today  feeling quite well outside of her knee pain.  Since her visit last year she was diagnosed with adreanal insufficiency and after being started on steroid therapy has felt 100% better.  She was walking 3 miles a day or most days for exercise without any kind of exertional limitations at a brisk pace until her knee injury in May.  She denies any kind of CP, palpitations or SOB, no dizzy spells, near syncope or syncope. She had an arthroscopic surgery last month and was recommended to undergo replacement surgery, planned for the 17th.  She has had significant weight gain since her last visit, this has been slow and steady since started on steroid tx, she does nt feel like she is retaining fluid, denies any DOE or symptoms of PND orthopnea.   Past Medical History:  Diagnosis Date  . Anemia    denies  . Anxiety   . Basal cell carcinoma    eye lid  . Cough    hx. dry cough chronic  . Disorder of tear duct system    frequent tearing of eyes  . GERD (gastroesophageal reflux disease)   . H/O: whooping cough 2000  . History of kidney infection 2015   ? sepsis  . History of kidney stones   . History of kidney stones   . Hyperlipidemia   . Hypertension   . IBS (irritable bowel syndrome)    controlled with med  . Osteoarthritis    arthritis -joints-limited ROM shoulders more on right  .  PONV (postoperative nausea and vomiting)    " woke up to soon"  . Sleep apnea    no longer using cpap  . Stroke ([place])   . UTI (lower urinary tract infection)   . Wolff-Parkinson-White (WPW) syndrome     Past Surgical History:  Procedure Laterality Date  . ablation surgery     1993 [facility] (Dr. Hulen Skains)  . APPENDECTOMY    . CARDIAC ELECTROPHYSIOLOGY STUDY AND ABLATION     '93 for WPW syndrome  . CARPAL TUNNEL RELEASE Right 12/21/2014   Procedure: RIGHT CARPAL TUNNEL RELEASE ;  Surgeon: Netta Cedars, MD;  Location: [place];  Service: Orthopedics;  Laterality: Right;  . CARPAL TUNNEL RELEASE Left   .  CERVICAL SPINE SURGERY     cervical fusion with hardware retained  . COLON SURGERY     "colon detached from abdomen"  . COLONOSCOPY WITH PROPOFOL N/A 05/10/2014   Procedure: COLONOSCOPY WITH PROPOFOL;  Surgeon: Juanita Craver, MD;  [facility];  Service: Endoscopy;  Laterality: N/A;  . CYSTOSCOPY/RETROGRADE/URETEROSCOPY Left 12/18/2013   Procedure: CYSTOSCOPY/RETROGRADE/LEFT STENT;  Surgeon: Malka So, MD;  [facility];  Service: Urology;  Laterality: Left;  . ESOPHAGOGASTRODUODENOSCOPY (EGD) WITH PROPOFOL N/A 05/10/2014   Procedure: ESOPHAGOGASTRODUODENOSCOPY (EGD) WITH PROPOFOL;  Surgeon: Juanita Craver, MD;  [facility];  Service: Endoscopy;  Laterality: N/A;  . EYELID CARCINOMA EXCISION    . JOINT REPLACEMENT    . REVERSE SHOULDER ARTHROPLASTY Right 06/29/2014   Procedure: REVERSE TOTAL SHOULDER ARTHROPLASTY;  Surgeon: Netta Cedars, MD;  Location: [place];  Service: Orthopedics;  Laterality: Right;  . REVISION TOTAL SHOULDER TO REVERSE TOTAL SHOULDER Left 05/15/2015  . REVISION TOTAL SHOULDER TO REVERSE TOTAL SHOULDER Left 05/15/2015   Procedure: LEFT SHOULDER REVISION TOTAL SHOULDER ARTHRPLASTY TO REVERSE TOTAL SHOULDER ARTHROPLASTY ;  Surgeon: Netta Cedars, MD;  Location: [place];  Service: Orthopedics;  Laterality: Left;  . tear duct surgery    . TOTAL ABDOMINAL HYSTERECTOMY    . TOTAL HIP ARTHROPLASTY     x 3(rt x2, lt x1)  . TOTAL KNEE ARTHROPLASTY Right 08/08/2013   Procedure: RIGHT TOTAL KNEE ARTHROPLASTY WITH SAPHENOUS NERVE SCAR EXCISION;  Surgeon: Mauri Pole, MD;  [facility];  Service: Orthopedics;  Laterality: Right;  . TOTAL SHOULDER ARTHROPLASTY Left 07/15/2012   Procedure: LEFT TOTAL SHOULDER ARTHROPLASTY;  Surgeon: Augustin Schooling, MD;  Location: [place];  Service: Orthopedics;  Laterality: Left;  . TOTAL SHOULDER REVISION Right 12/21/2014   Procedure: RIGHT SHOULDER POLY EXCHANGE ;  Surgeon: Netta Cedars, MD;  Location: [place];  Service: Orthopedics;   Laterality: Right;  . URETEROSCOPY  stent placement    Current Outpatient Prescriptions  Medication Sig Dispense Refill  . AMITIZA 24 MCG capsule Take 24 mg by mouth 2 (two) times daily.    . carvedilol (COREG) 25 MG tablet Take 12.5 mg by mouth 2 (two) times daily.    . celecoxib (CELEBREX) 200 MG capsule Take 200 mg by mouth 2 (two) times daily.    . cholecalciferol (VITAMIN D) 1000 UNITS tablet Take 1,000 Units by mouth daily.    . citalopram (CELEXA) 20 MG tablet Take 20 mg by mouth at bedtime.    Marland Kitchen CRANBERRY PO Take 1 tablet by mouth daily.    Marland Kitchen esomeprazole (NEXIUM) 20 MG capsule Take 20 mg by mouth daily as needed (indigestion.).    Marland Kitchen furosemide (LASIX) 20 MG tablet Take two tablets (40 mg) daily as needed for swelling.    Marland Kitchen  losartan-hydrochlorothiazide (HYZAAR) 100-12.5 MG tablet Take 1 tablet by mouth daily. IF LIGHTHEADEDNESS OR  DIZZINESS AND BLOOD PRESSURE RUNS SYSTOLIC 852 OR LESS CUT IN HALF 90 tablet 5  . Multiple Vitamin (MULTIVITAMIN) tablet Take 1 tablet by mouth every morning.     Marland Kitchen oxyCODONE-acetaminophen (PERCOCET) 5-325 MG tablet Take 1-2 tablets by mouth every 6 (six) hours as needed. 15 tablet 0  . pramipexole (MIRAPEX) 1 MG tablet Take 1 mg by mouth at bedtime.    . promethazine (PHENERGAN) 25 MG tablet Take 1 tablet (25 mg total) by mouth every 6 (six) hours as needed for nausea or vomiting. 60 tablet 0  . tiZANidine (ZANAFLEX) 4 MG tablet Take 4 mg by mouth 2 (two) times daily.     No current facility-administered medications for this visit.    Facility-Administered Medications Ordered in Other Visits  Medication Dose Route Frequency Provider Last Rate Last Dose  . chlorhexidine (HIBICLENS) 4 % liquid 4 application  60 mL Topical Once Dixon, Robb Matar, PA-C      . technetium tetrofosmin (TC-MYOVIEW) injection 31.7 milli Curie  77.8 millicurie Intravenous Once PRN Dorothy Spark, MD        Allergies:   Codeine and Hydrocodone   Social History:  The  patient  reports that she has never smoked. She has never used smokeless tobacco. She reports that she does not drink alcohol or use drugs.   Family History:  The patient's family history includes Cancer in her maternal uncle and paternal grandfather; Heart attack in her maternal grandfather and mother; Heart disease in her maternal grandfather and mother; Heart failure in her mother; Hyperlipidemia in her mother; Hypertension in her mother; Leukemia in her brother; Lung cancer in her paternal grandfather.  ROS:  Please see the history of present illness.    All other systems are reviewed and otherwise negative.   PHYSICAL EXAM:  VS:  BP 110/70   Pulse 65   Ht 5\' 5"  (1.651 m)   Wt 273 lb (123.8 kg)   BMI 45.43 kg/m  BMI: Body mass index is 45.43 kg/m. Well nourished, well developed, in no acute distress  HEENT: normocephalic, atraumatic  Neck: no JVD, carotid bruits or masses Cardiac:  RRR; no significant murmurs, no rubs, or gallops Lungs: CTA b/l, no wheezing, rhonchi or rales  Abd: soft, nontender MS: no deformity or atrophy Ext:  no edema  Skin: warm and dry, no rash Neuro:  No gross deficits appreciated Psych: euthymic mood, full affect   EKG:  Done today and reviewed by myself with Dr. Tamala Julian (DOD): SR 65bpm, PR 16ms, QRS 11ms, QTc 476ms, appears unchanged, no pre-excitation  08/22/15: Lexiscan stress test  Nuclear stress EF: 76%.  There was no ST segment deviation noted during stress.  This is a low risk study.  Basal septal wall decreased radiotracer uptake seen at both rest and stress, no ischemia identified.  Recent Labs: No results found for requested labs within last 8760 hours.  No results found for requested labs within last 8760 hours.   CrCl cannot be calculated (Patient's most recent lab result is older than the maximum 21 days allowed.).   Wt Readings from Last 3 Encounters:  08/14/16 273 lb (123.8 kg)  08/06/15 249 lb 3.2 oz (113 kg)  07/03/15 251 lb  (113.9 kg)     Other studies reviewed: Additional studies/records reviewed today include: summarized above  ASSESSMENT AND PLAN:  1. Pre-op      No hx of  known CAD     She had very good exertional capacity without physical limitations until her knee injury in May     No ischemia on her stress test with LVEF 76% by this study July 2017.     Reviewed the case with the DOD, Dr. Tamala Julian, given above do not feel she needs a new ischemic work up and is low cardiac risk for knee replacement surgery planned      2. HTN     Looks good today, no changes  3. WPW, ablated     no pre-excitation is appreciated on EKG  4. Adrenal insufficiency     Follows with endocriology     She has per-op steroid instructions   Disposition: F/u with annual visits, and PRN  Current medicines are reviewed at length with the patient today.  The patient did not have any concerns regarding medicines.  Haywood Lasso, PA-C 08/14/2016 8:38 AM     [address] [place] [address] [phone] (office)  [phone] (fax)

## 2016-08-14 ENCOUNTER — Ambulatory Visit (INDEPENDENT_AMBULATORY_CARE_PROVIDER_SITE_OTHER): Payer: BC Managed Care – PPO | Admitting: Physician Assistant

## 2016-08-14 VITALS — BP 110/70 | HR 65 | Ht 65.0 in | Wt 273.0 lb

## 2016-08-14 DIAGNOSIS — Z01818 Encounter for other preprocedural examination: Secondary | ICD-10-CM

## 2016-08-14 DIAGNOSIS — I10 Essential (primary) hypertension: Secondary | ICD-10-CM

## 2016-08-14 NOTE — Patient Instructions (Signed)
Medication Instructions:   Your physician recommends that you continue on your current medications as directed. Please refer to the Current Medication list given to you today.   If you need a refill on your cardiac medications before your next appointment, please call your pharmacy.  Labwork: NONE ORDERED  TODAY    Testing/Procedures: NONE ORDERED  TODAY    Follow-Up: Your physician wants you to follow-up in: ONE YEAR WITH  KLE[facility]   You will receive a reminder letter in the mail two months in advance. If you don't receive a letter, please call our office to schedule the follow-up appointment.     Any Other Special Instructions Will Be Listed Below (If Applicable).                                                                                                                                                    

## 2016-08-17 NOTE — H&P (Signed)
TOTAL KNEE ADMISSION H&P  Patient is being admitted for left total knee arthroplasty.  Subjective:  Chief Complaint:     Left knee primary OA / pain  HPI: Connie Haynes, 61 y.o. female, has a history of pain and functional disability in the left knee due to arthritis and has failed non-surgical conservative treatments for greater than 12 weeks to include NSAID's and/or analgesics, corticosteriod injections and activity modification.  Onset of symptoms was gradual, starting  years ago with gradually worsening course since that time. The patient noted prior procedures on the knee to include  arthroscopy and menisectomy on the left knee(s).  Patient currently rates pain in the left knee(s) at 10 out of 10 with activity. Patient has worsening of pain with activity and weight bearing, pain that interferes with activities of daily living, pain with passive range of motion, crepitus and joint swelling.  Patient has evidence of periarticular osteophytes and joint space narrowing by imaging studies.  There is no active infection.   Risks, benefits and expectations were discussed with the patient.  Risks including but not limited to the risk of anesthesia, blood clots, nerve damage, blood vessel damage, failure of the prosthesis, infection and up to and including death.  Patient understand the risks, benefits and expectations and wishes to proceed with surgery.    PCP: [name], MD  D/C Plans:       Home   Post-op Meds:       No Rx given  Tranexamic Acid:      To be given - IV   Decadron:      Is to be given  FYI:     ASA  Oxycodone  Zanaflex  DME:   Pt already has equipment   PT:   OPPT   Patient Active Problem List   Diagnosis Date Noted  . S/P shoulder replacement 06/29/2014  . OSA (obstructive sleep apnea) 01/22/2014  . Left ureteral stone 12/18/2013  . UTI (urinary tract infection) 12/18/2013  . Morbid obesity ([place]) 08/09/2013  . S/P right TKA 08/08/2013  . Chronic pain  syndrome 03/04/2013  . Depressive disorder, not elsewhere classified 03/04/2013  . Need for prophylactic vaccination and inoculation against influenza 10/04/2012  . Erosive osteoarthritis of multiple sites 10/04/2012  . Chronic cough 10/04/2012  . Swelling of limb 08/21/2012  . Essential hypertension, benign 08/21/2012  . Preoperative general physical examination 06/12/2012  . Restless leg syndrome 06/12/2012  . Anxiety state, unspecified 06/12/2012  . Lung nodule 07/16/2011  . Cough 04/21/2011  . Wolff-Parkinson-White (WPW) syndrome   . Osteoarthritis   . Hyperlipidemia   . IBS (irritable bowel syndrome)   . Kidney stones    Past Medical History:  Diagnosis Date  . Anemia    denies  . Anxiety   . Basal cell carcinoma    eye lid  . Cough    hx. dry cough chronic  . Disorder of tear duct system    frequent tearing of eyes  . GERD (gastroesophageal reflux disease)   . H/O: whooping cough 2000  . History of kidney infection 2015   ? sepsis  . History of kidney stones   . History of kidney stones   . Hyperlipidemia   . Hypertension   . IBS (irritable bowel syndrome)    controlled with med  . Osteoarthritis    arthritis -joints-limited ROM shoulders more on right  . PONV (postoperative nausea and vomiting)    " woke up to soon"  .  Sleep apnea    no longer using cpap  . Stroke ([place])   . UTI (lower urinary tract infection)   . Wolff-Parkinson-White (WPW) syndrome     Past Surgical History:  Procedure Laterality Date  . ablation surgery     1993 [facility] (Dr. Hulen Skains)  . APPENDECTOMY    . CARDIAC ELECTROPHYSIOLOGY STUDY AND ABLATION     '93 for WPW syndrome  . CARPAL TUNNEL RELEASE Right 12/21/2014   Procedure: RIGHT CARPAL TUNNEL RELEASE ;  Surgeon: Netta Cedars, MD;  Location: [place];  Service: Orthopedics;  Laterality: Right;  . CARPAL TUNNEL RELEASE Left   . CERVICAL SPINE SURGERY     cervical fusion with hardware retained  . COLON SURGERY     "colon detached  from abdomen"  . COLONOSCOPY WITH PROPOFOL N/A 05/10/2014   Procedure: COLONOSCOPY WITH PROPOFOL;  Surgeon: Juanita Craver, MD;  Location: [facility];  Service: Endoscopy;  Laterality: N/A;  . CYSTOSCOPY/RETROGRADE/URETEROSCOPY Left 12/18/2013   Procedure: CYSTOSCOPY/RETROGRADE/LEFT STENT;  Surgeon: Malka So, MD;  Location: [facility];  Service: Urology;  Laterality: Left;  . ESOPHAGOGASTRODUODENOSCOPY (EGD) WITH PROPOFOL N/A 05/10/2014   Procedure: ESOPHAGOGASTRODUODENOSCOPY (EGD) WITH PROPOFOL;  Surgeon: Juanita Craver, MD;  Location[facility];  Service: Endoscopy;  Laterality: N/A;  . EYELID CARCINOMA EXCISION    . JOINT REPLACEMENT    . REVERSE SHOULDER ARTHROPLASTY Right 06/29/2014   Procedure: REVERSE TOTAL SHOULDER ARTHROPLASTY;  Surgeon: Netta Cedars, MD;  Location: [place];  Service: Orthopedics;  Laterality: Right;  . REVISION TOTAL SHOULDER TO REVERSE TOTAL SHOULDER Left 05/15/2015  . REVISION TOTAL SHOULDER TO REVERSE TOTAL SHOULDER Left 05/15/2015   Procedure: LEFT SHOULDER REVISION TOTAL SHOULDER ARTHRPLASTY TO REVERSE TOTAL SHOULDER ARTHROPLASTY ;  Surgeon: Netta Cedars, MD;  Location: [place];  Service: Orthopedics;  Laterality: Left;  . tear duct surgery    . TOTAL ABDOMINAL HYSTERECTOMY    . TOTAL HIP ARTHROPLASTY     x 3(rt x2, lt x1)  . TOTAL KNEE ARTHROPLASTY Right 08/08/2013   Procedure: RIGHT TOTAL KNEE ARTHROPLASTY WITH SAPHENOUS NERVE SCAR EXCISION;  Surgeon: Mauri Pole, MD;  Location: [facility];  Service: Orthopedics;  Laterality: Right;  . TOTAL SHOULDER ARTHROPLASTY Left 07/15/2012   Procedure: LEFT TOTAL SHOULDER ARTHROPLASTY;  Surgeon: Augustin Schooling, MD;  Location: [place];  Service: Orthopedics;  Laterality: Left;  . TOTAL SHOULDER REVISION Right 12/21/2014   Procedure: RIGHT SHOULDER POLY EXCHANGE ;  Surgeon: Netta Cedars, MD;  Location: [place];  Service: Orthopedics;  Laterality: Right;  . URETEROSCOPY  stent placement    No prescriptions prior to admission.    Allergies  Allergen Reactions  . Codeine Nausea And Vomiting  . Hydrocodone Nausea Only    Social History  Substance Use Topics  . Smoking status: Never Smoker  . Smokeless tobacco: Never Used  . Alcohol use No    Family History  Problem Relation Age of Onset  . Leukemia Brother   . Heart disease Mother   . Hypertension Mother   . Hyperlipidemia Mother   . Heart attack Mother   . Heart failure Mother   . Heart disease Maternal Grandfather   . Heart attack Maternal Grandfather   . Cancer Paternal Grandfather   . Lung cancer Paternal Grandfather   . Cancer Maternal Uncle        bone     Review of Systems  Constitutional: Negative.   HENT: Negative.   Eyes: Negative.   Respiratory: Positive for  cough.   Cardiovascular: Negative.   Gastrointestinal: Positive for heartburn.  Genitourinary: Negative.   Musculoskeletal: Positive for back pain and joint pain.  Skin: Negative.   Neurological: Negative.   Endo/Heme/Allergies: Negative.   Psychiatric/Behavioral: Positive for depression. The patient is nervous/anxious.     Objective:  Physical Exam  Constitutional: She is oriented to person, place, and time. She appears well-developed.  HENT:  Head: Normocephalic.  Eyes: Pupils are equal, round, and reactive to light.  Neck: Neck supple. No JVD present. No tracheal deviation present. No thyromegaly present.  Cardiovascular: Normal rate, regular rhythm and intact distal pulses.   Respiratory: Effort normal and breath sounds normal. No respiratory distress. She has no wheezes.  GI: Soft. There is no tenderness. There is no guarding.  Musculoskeletal:       Left knee: She exhibits decreased range of motion and swelling. She exhibits no ecchymosis, no deformity, no laceration and no erythema. Tenderness found.  Lymphadenopathy:    She has no cervical adenopathy.  Neurological: She is alert and oriented to person, place, and time. A sensory deficit (right thigh pain,  radiating from back issues) is present.  Skin: Skin is warm and dry.  Psychiatric: She has a normal mood and affect.      Labs:  Estimated body mass index is 45.43 kg/m as calculated from the following:   Height as of 08/14/16: 5\' 5"  (1.651 m).   Weight as of 08/14/16: 123.8 kg (273 lb).   Imaging Review Plain radiographs demonstrate severe degenerative joint disease of the left knee(s). The overall alignment is neutral. The bone quality appears to be good for age and reported activity level.  Assessment/Plan:  End stage arthritis, left knee   The patient history, physical examination, clinical judgment of the provider and imaging studies are consistent with end stage degenerative joint disease of the left knee(s) and total knee arthroplasty is deemed medically necessary. The treatment options including medical management, injection therapy arthroscopy and arthroplasty were discussed at length. The risks and benefits of total knee arthroplasty were presented and reviewed. The risks due to aseptic loosening, infection, stiffness, patella tracking problems, thromboembolic complications and other imponderables were discussed. The patient acknowledged the explanation, agreed to proceed with the plan and consent was signed. Patient is being admitted for inpatient treatment for surgery, pain control, PT, OT, prophylactic antibiotics, VTE prophylaxis, progressive ambulation and ADL's and discharge planning. The patient is planning to be discharged home.     [place] Mal[place] Uplinger   PA-C  08/17/2016, 10:53 AM

## 2016-08-18 ENCOUNTER — Other Ambulatory Visit (HOSPITAL_COMMUNITY): Payer: Self-pay | Admitting: Emergency Medicine

## 2016-08-18 NOTE — Progress Notes (Signed)
LOV/cardiology clearance Connie Cradle PA-C 08-14-16 epic   LOV/ endocrinology clearance Tamala Julian MD 08-05-16 epic  EKG 08-14-16 epic  Stress test 08-21-15 epic result reads  "Nuclear stress EF: 76%. There was no ST segment deviation noted during stress. This is a low risk study. Basal septal wall decreased radiotracer uptake seen at both rest and stress, no ischemia identified."  CXR 07-03-15 epic

## 2016-08-18 NOTE — Patient Instructions (Signed)
Connie Haynes  08/18/2016   Your procedure is scheduled on: 08-25-16  Report to [facility] Main  Entrance    Report to admitting at 1025AM   Call this number if you have problems the morning of surgery [phone]   Remember: ONLY 1 PERSON MAY GO WITH YOU TO SHORT STAY TO GET  READY MORNING OF YOUR SURGERY.  Do not eat food or drink liquids :After Midnight.     Take these medicines the morning of surgery with A SIP OF WATER: carvedilol(coreg), esomeprazole(nexium), oxycodone as needed                                You may not have any metal on your body including hair pins and              piercings  Do not wear jewelry, make-up, lotions, powders or perfumes, deodorant             Do not wear nail polish.  Do not shave  48 hours prior to surgery.            Do not bring valuables to the hospital. [place].  Contacts, dentures or bridgework may not be worn into surgery.  Leave suitcase in the car. After surgery it may be brought to your room.                Please read over the following fact sheets you were given: _____________________________________________________________________           [facility] - Preparing for Surgery Before surgery, you can play an important role.  Because skin is not sterile, your skin needs to be as free of germs as possible.  You can reduce the number of germs on your skin by washing with CHG (chlorahexidine gluconate) soap before surgery.  CHG is an antiseptic cleaner which kills germs and bonds with the skin to continue killing germs even after washing. Please DO NOT use if you have an allergy to CHG or antibacterial soaps.  If your skin becomes reddened/irritated stop using the CHG and inform your nurse when you arrive at Short Stay. Do not shave (including legs and underarms) for at least 48 hours prior to the first CHG shower.  You may shave your face/neck. Please  follow these instructions carefully:  1.  Shower with CHG Soap the night before surgery and the  morning of Surgery.  2.  If you choose to wash your hair, wash your hair first as usual with your  normal  shampoo.  3.  After you shampoo, rinse your hair and body thoroughly to remove the  shampoo.                           4.  Use CHG as you would any other liquid soap.  You can apply chg directly  to the skin and wash                       Gently with a scrungie or clean washcloth.  5.  Apply the CHG Soap to your body ONLY FROM THE NECK DOWN.   Do not use on face/  open                           Wound or open sores. Avoid contact with eyes, ears mouth and genitals (private parts).                       Wash face,  Genitals (private parts) with your normal soap.             6.  Wash thoroughly, paying special attention to the area where your surgery  will be performed.  7.  Thoroughly rinse your body with warm water from the neck down.  8.  DO NOT shower/wash with your normal soap after using and rinsing off  the CHG Soap.                9.  Pat yourself dry with a clean towel.            10.  Wear clean pajamas.            11.  Place clean sheets on your bed the night of your first shower and do not  sleep with pets. Day of Surgery : Do not apply any lotions/deodorants the morning of surgery.  Please wear clean clothes to the hospital/surgery center.  FAILURE TO FOLLOW THESE INSTRUCTIONS MAY RESULT IN THE CANCELLATION OF YOUR SURGERY PATIENT SIGNATURE_________________________________  NURSE SIGNATURE__________________________________  ________________________________________________________________________   Adam Phenix  An incentive spirometer is a tool that can help keep your lungs clear and active. This tool measures how well you are filling your lungs with each breath. Taking long deep breaths may help reverse or decrease the chance of developing breathing (pulmonary) problems  (especially infection) following:  A long period of time when you are unable to move or be active. BEFORE THE PROCEDURE   If the spirometer includes an indicator to show your best effort, your nurse or respiratory therapist will set it to a desired goal.  If possible, sit up straight or lean slightly forward. Try not to slouch.  Hold the incentive spirometer in an upright position. INSTRUCTIONS FOR USE  1. Sit on the edge of your bed if possible, or sit up as far as you can in bed or on a chair. 2. Hold the incentive spirometer in an upright position. 3. Breathe out normally. 4. Place the mouthpiece in your mouth and seal your lips tightly around it. 5. Breathe in slowly and as deeply as possible, raising the piston or the ball toward the top of the column. 6. Hold your breath for 3-5 seconds or for as long as possible. Allow the piston or ball to fall to the bottom of the column. 7. Remove the mouthpiece from your mouth and breathe out normally. 8. Rest for a few seconds and repeat Steps 1 through 7 at least 10 times every 1-2 hours when you are awake. Take your time and take a few normal breaths between deep breaths. 9. The spirometer may include an indicator to show your best effort. Use the indicator as a goal to work toward during each repetition. 10. After each set of 10 deep breaths, practice coughing to be sure your lungs are clear. If you have an incision (the cut made at the time of surgery), support your incision when coughing by placing a pillow or rolled up towels firmly against it. Once you are able to get out of bed, walk around indoors and  cough well. You may stop using the incentive spirometer when instructed by your caregiver.  RISKS AND COMPLICATIONS  Take your time so you do not get dizzy or light-headed.  If you are in pain, you may need to take or ask for pain medication before doing incentive spirometry. It is harder to take a deep breath if you are having  pain. AFTER USE  Rest and breathe slowly and easily.  It can be helpful to keep track of a log of your progress. Your caregiver can provide you with a simple table to help with this. If you are using the spirometer at home, follow these instructions: [place] IF:   You are having difficultly using the spirometer.  You have trouble using the spirometer as often as instructed.  Your pain medication is not giving enough relief while using the spirometer.  You develop fever of 100.5 F (38.1 C) or higher. SEEK IMMEDIATE MEDICAL CARE IF:   You cough up bloody sputum that had not been present before.  You develop fever of 102 F (38.9 C) or greater.  You develop worsening pain at or near the incision site. MAKE SURE YOU:   Understand these instructions.  Will watch your condition.  Will get help right away if you are not doing well or get worse. Document Released: 06/08/2006 Document Revised: 04/20/2011 Document Reviewed: 08/09/2006 Exit[employer] Patient Information 2014 Exit[employer], [place].   ________________________________________________________________________  WHAT IS A BLOOD TRANSFUSION? Blood Transfusion Information  A transfusion is the replacement of blood or some of its parts. Blood is made up of multiple cells which provide different functions.  Red blood cells carry oxygen and are used for blood loss replacement.  White blood cells fight against infection.  Platelets control bleeding.  Plasma helps clot blood.  Other blood products are available for specialized needs, such as hemophilia or other clotting disorders. BEFORE THE TRANSFUSION  Who gives blood for transfusions?   Healthy volunteers who are fully evaluated to make sure their blood is safe. This is blood bank blood. Transfusion therapy is the safest it has ever been in the practice of medicine. Before blood is taken from a donor, a complete history is taken to make sure that person has no history  of diseases nor engages in risky social behavior (examples are intravenous drug use or sexual activity with multiple partners). The donor's travel history is screened to minimize risk of transmitting infections, such as malaria. The donated blood is tested for signs of infectious diseases, such as HIV and hepatitis. The blood is then tested to be sure it is compatible with you in order to minimize the chance of a transfusion reaction. If you or a relative donates blood, this is often done in anticipation of surgery and is not appropriate for emergency situations. It takes many days to process the donated blood. RISKS AND COMPLICATIONS Although transfusion therapy is very safe and saves many lives, the main dangers of transfusion include:   Getting an infectious disease.  Developing a transfusion reaction. This is an allergic reaction to something in the blood you were given. Every precaution is taken to prevent this. The decision to have a blood transfusion has been considered carefully by your caregiver before blood is given. Blood is not given unless the benefits outweigh the risks. AFTER THE TRANSFUSION  Right after receiving a blood transfusion, you will usually feel much better and more energetic. This is especially true if your red blood cells have gotten low (anemic).  The transfusion raises the level of the red blood cells which carry oxygen, and this usually causes an energy increase.  The nurse administering the transfusion will monitor you carefully for complications. HOME CARE INSTRUCTIONS  No special instructions are needed after a transfusion. You may find your energy is better. Speak with your caregiver about any limitations on activity for underlying diseases you may have. SEEK MEDICAL CARE IF:   Your condition is not improving after your transfusion.  You develop redness or irritation at the intravenous (IV) site. SEEK IMMEDIATE MEDICAL CARE IF:  Any of the following symptoms  occur over the next 12 hours:  Shaking chills.  You have a temperature by mouth above 102 F (38.9 C), not controlled by medicine.  Chest, back, or muscle pain.  People around you feel you are not acting correctly or are confused.  Shortness of breath or difficulty breathing.  Dizziness and fainting.  You get a rash or develop hives.  You have a decrease in urine output.  Your urine turns a dark color or changes to pink, red, or brown. Any of the following symptoms occur over the next 10 days:  You have a temperature by mouth above 102 F (38.9 C), not controlled by medicine.  Shortness of breath.  Weakness after normal activity.  The white part of the eye turns yellow (jaundice).  You have a decrease in the amount of urine or are urinating less often.  Your urine turns a dark color or changes to pink, red, or brown. Document Released: 01/24/2000 Document Revised: 04/20/2011 Document Reviewed: 09/12/2007 [employer] Patient Information 2014 [place], [place].  _______________________________________________________________________

## 2016-08-19 ENCOUNTER — Encounter (HOSPITAL_COMMUNITY)
Admission: RE | Admit: 2016-08-19 | Discharge: 2016-08-19 | Disposition: A | Discharge status: Home / Self Care | Payer: BC Managed Care – PPO | Source: Ambulatory Visit | Attending: Orthopedic Surgery | Admitting: Orthopedic Surgery

## 2016-08-19 ENCOUNTER — Encounter (HOSPITAL_COMMUNITY): Payer: Self-pay

## 2016-08-19 DIAGNOSIS — Z01812 Encounter for preprocedural laboratory examination: Secondary | ICD-10-CM | POA: Diagnosis present

## 2016-08-19 DIAGNOSIS — M1712 Unilateral primary osteoarthritis, left knee: Secondary | ICD-10-CM | POA: Diagnosis not present

## 2016-08-19 LAB — CBC
HCT: 37.6 % (ref 36.0–46.0)
Hemoglobin: 12.7 g/dL (ref 12.0–15.0)
MCH: 30 pg (ref 26.0–34.0)
MCHC: 33.8 g/dL (ref 30.0–36.0)
MCV: 88.9 fL (ref 78.0–100.0)
Platelets: 269 10*3/uL (ref 150–400)
RBC: 4.23 MIL/uL (ref 3.87–5.11)
RDW: 12.8 % (ref 11.5–15.5)
WBC: 9.5 10*3/uL (ref 4.0–10.5)

## 2016-08-19 LAB — BASIC METABOLIC PANEL WITH GFR
Anion gap: 9 (ref 5–15)
BUN: 32 mg/dL — ABNORMAL HIGH (ref 6–20)
CO2: 25 mmol/L (ref 22–32)
Calcium: 9.5 mg/dL (ref 8.9–10.3)
Chloride: 104 mmol/L (ref 101–111)
Creatinine, Ser: 1.13 mg/dL — ABNORMAL HIGH (ref 0.44–1.00)
GFR calc Af Amer: 59 mL/min — ABNORMAL LOW
GFR calc non Af Amer: 51 mL/min — ABNORMAL LOW
Glucose, Bld: 110 mg/dL — ABNORMAL HIGH (ref 65–99)
Potassium: 3.9 mmol/L (ref 3.5–5.1)
Sodium: 138 mmol/L (ref 135–145)

## 2016-08-19 LAB — SURGICAL PCR SCREEN
MRSA, PCR: NEGATIVE
Staphylococcus aureus: NEGATIVE

## 2016-08-19 NOTE — Progress Notes (Signed)
BMP result routed via epic to Dr Alvan Dame

## 2016-08-24 MED ORDER — DEXTROSE 5 % IV SOLN
3.0000 g | INTRAVENOUS | Status: AC
  Administered 2016-08-25: 3 g via INTRAVENOUS
  Filled 2016-08-24: qty 3

## 2016-08-25 ENCOUNTER — Inpatient Hospital Stay (HOSPITAL_COMMUNITY): Payer: BC Managed Care – PPO | Admitting: Anesthesiology

## 2016-08-25 ENCOUNTER — Encounter (HOSPITAL_COMMUNITY): Payer: Self-pay | Admitting: *Deleted

## 2016-08-25 ENCOUNTER — Inpatient Hospital Stay (HOSPITAL_COMMUNITY)
Admission: RE | Admit: 2016-08-25 | Discharge: 2016-08-27 | DRG: 470 | Disposition: A | Discharge status: Home / Self Care | Payer: BC Managed Care – PPO | Source: Ambulatory Visit | Attending: Orthopedic Surgery | Admitting: Orthopedic Surgery

## 2016-08-25 ENCOUNTER — Encounter (HOSPITAL_COMMUNITY)
Admission: RE | Disposition: A | Discharge status: Home / Self Care | Payer: Self-pay | Source: Ambulatory Visit | Attending: Orthopedic Surgery

## 2016-08-25 DIAGNOSIS — E662 Morbid (severe) obesity with alveolar hypoventilation: Secondary | ICD-10-CM | POA: Diagnosis present

## 2016-08-25 DIAGNOSIS — Z96651 Presence of right artificial knee joint: Secondary | ICD-10-CM | POA: Diagnosis present

## 2016-08-25 DIAGNOSIS — G2581 Restless legs syndrome: Secondary | ICD-10-CM | POA: Diagnosis present

## 2016-08-25 DIAGNOSIS — Z85828 Personal history of other malignant neoplasm of skin: Secondary | ICD-10-CM

## 2016-08-25 DIAGNOSIS — Z79891 Long term (current) use of opiate analgesic: Secondary | ICD-10-CM

## 2016-08-25 DIAGNOSIS — F419 Anxiety disorder, unspecified: Secondary | ICD-10-CM | POA: Diagnosis present

## 2016-08-25 DIAGNOSIS — E785 Hyperlipidemia, unspecified: Secondary | ICD-10-CM | POA: Diagnosis present

## 2016-08-25 DIAGNOSIS — Z90710 Acquired absence of both cervix and uterus: Secondary | ICD-10-CM

## 2016-08-25 DIAGNOSIS — I456 Pre-excitation syndrome: Secondary | ICD-10-CM | POA: Diagnosis present

## 2016-08-25 DIAGNOSIS — K219 Gastro-esophageal reflux disease without esophagitis: Secondary | ICD-10-CM | POA: Diagnosis present

## 2016-08-25 DIAGNOSIS — Z96652 Presence of left artificial knee joint: Secondary | ICD-10-CM

## 2016-08-25 DIAGNOSIS — Z8249 Family history of ischemic heart disease and other diseases of the circulatory system: Secondary | ICD-10-CM

## 2016-08-25 DIAGNOSIS — Z806 Family history of leukemia: Secondary | ICD-10-CM

## 2016-08-25 DIAGNOSIS — Z96643 Presence of artificial hip joint, bilateral: Secondary | ICD-10-CM | POA: Diagnosis present

## 2016-08-25 DIAGNOSIS — Z96659 Presence of unspecified artificial knee joint: Secondary | ICD-10-CM

## 2016-08-25 DIAGNOSIS — Z8673 Personal history of transient ischemic attack (TIA), and cerebral infarction without residual deficits: Secondary | ICD-10-CM

## 2016-08-25 DIAGNOSIS — Z87442 Personal history of urinary calculi: Secondary | ICD-10-CM

## 2016-08-25 DIAGNOSIS — Z8349 Family history of other endocrine, nutritional and metabolic diseases: Secondary | ICD-10-CM

## 2016-08-25 DIAGNOSIS — G894 Chronic pain syndrome: Secondary | ICD-10-CM | POA: Diagnosis present

## 2016-08-25 DIAGNOSIS — K581 Irritable bowel syndrome with constipation: Secondary | ICD-10-CM | POA: Diagnosis present

## 2016-08-25 DIAGNOSIS — Z96612 Presence of left artificial shoulder joint: Secondary | ICD-10-CM | POA: Diagnosis present

## 2016-08-25 DIAGNOSIS — F329 Major depressive disorder, single episode, unspecified: Secondary | ICD-10-CM | POA: Diagnosis present

## 2016-08-25 DIAGNOSIS — Z6841 Body Mass Index (BMI) 40.0 and over, adult: Secondary | ICD-10-CM

## 2016-08-25 DIAGNOSIS — Z885 Allergy status to narcotic agent status: Secondary | ICD-10-CM

## 2016-08-25 DIAGNOSIS — M1712 Unilateral primary osteoarthritis, left knee: Principal | ICD-10-CM | POA: Diagnosis present

## 2016-08-25 DIAGNOSIS — Z981 Arthrodesis status: Secondary | ICD-10-CM

## 2016-08-25 DIAGNOSIS — Z96611 Presence of right artificial shoulder joint: Secondary | ICD-10-CM | POA: Diagnosis present

## 2016-08-25 DIAGNOSIS — I10 Essential (primary) hypertension: Secondary | ICD-10-CM | POA: Diagnosis present

## 2016-08-25 DIAGNOSIS — Z7952 Long term (current) use of systemic steroids: Secondary | ICD-10-CM

## 2016-08-25 DIAGNOSIS — Z801 Family history of malignant neoplasm of trachea, bronchus and lung: Secondary | ICD-10-CM

## 2016-08-25 HISTORY — PX: TOTAL KNEE ARTHROPLASTY: SHX125

## 2016-08-25 LAB — TYPE AND SCREEN
ABO/RH(D): O POS
Antibody Screen: NEGATIVE

## 2016-08-25 SURGERY — ARTHROPLASTY, KNEE, TOTAL
Anesthesia: Spinal | Site: Knee | Laterality: Left

## 2016-08-25 MED ORDER — NON FORMULARY: 40.0000 mg | Freq: Every day | Status: DC

## 2016-08-25 MED ORDER — 0.9 % SODIUM CHLORIDE (POUR BTL) OPTIME
TOPICAL | Status: DC | PRN
  Administered 2016-08-25: 1000 mL

## 2016-08-25 MED ORDER — OXYCODONE HCL 5 MG PO TABS
5.0000 mg | ORAL_TABLET | ORAL | Status: DC
  Administered 2016-08-25: 15 mg via ORAL
  Administered 2016-08-25: 10 mg via ORAL
  Administered 2016-08-26 – 2016-08-27 (×7): 15 mg via ORAL
  Administered 2016-08-27: 10 mg via ORAL
  Administered 2016-08-27: 15 mg via ORAL
  Filled 2016-08-25 (×10): qty 3
  Filled 2016-08-25: qty 2

## 2016-08-25 MED ORDER — ALUM & MAG HYDROXIDE-SIMETH 200-200-20 MG/5ML PO SUSP: 15.0000 mL | ORAL | Status: DC | PRN

## 2016-08-25 MED ORDER — BUPIVACAINE-EPINEPHRINE (PF) 0.25% -1:200000 IJ SOLN
INTRAMUSCULAR | Status: AC
  Filled 2016-08-25: qty 30

## 2016-08-25 MED ORDER — CEFAZOLIN SODIUM-DEXTROSE 2-4 GM/100ML-% IV SOLN
2.0000 g | Freq: Four times a day (QID) | INTRAVENOUS | Status: AC
  Administered 2016-08-25 – 2016-08-26 (×2): 2 g via INTRAVENOUS
  Filled 2016-08-25 (×2): qty 100

## 2016-08-25 MED ORDER — FENTANYL CITRATE (PF) 100 MCG/2ML IJ SOLN
25.0000 ug | INTRAMUSCULAR | Status: DC | PRN
  Administered 2016-08-25: 50 ug via INTRAVENOUS
  Administered 2016-08-25: 25 ug via INTRAVENOUS
  Administered 2016-08-25: 50 ug via INTRAVENOUS
  Filled 2016-08-25 (×3): qty 2

## 2016-08-25 MED ORDER — SODIUM CHLORIDE 0.9 % IJ SOLN
INTRAMUSCULAR | Status: AC
  Filled 2016-08-25: qty 50

## 2016-08-25 MED ORDER — ONDANSETRON HCL 4 MG/2ML IJ SOLN
4.0000 mg | Freq: Four times a day (QID) | INTRAMUSCULAR | Status: DC | PRN
  Administered 2016-08-25: 4 mg via INTRAVENOUS
  Filled 2016-08-25: qty 2

## 2016-08-25 MED ORDER — ESOMEPRAZOLE MAGNESIUM 40 MG PO CPDR
40.0000 mg | DELAYED_RELEASE_CAPSULE | Freq: Every day | ORAL | Status: DC
  Administered 2016-08-26 – 2016-08-27 (×2): 40 mg via ORAL
  Filled 2016-08-25 (×2): qty 1

## 2016-08-25 MED ORDER — DOCUSATE SODIUM 100 MG PO CAPS
100.0000 mg | ORAL_CAPSULE | Freq: Two times a day (BID) | ORAL | Status: DC
  Administered 2016-08-25 – 2016-08-27 (×4): 100 mg via ORAL
  Filled 2016-08-25 (×5): qty 1

## 2016-08-25 MED ORDER — CITALOPRAM HYDROBROMIDE 20 MG PO TABS
20.0000 mg | ORAL_TABLET | Freq: Every day | ORAL | Status: DC
  Administered 2016-08-25 – 2016-08-26 (×2): 20 mg via ORAL
  Filled 2016-08-25 (×2): qty 1

## 2016-08-25 MED ORDER — ACETAMINOPHEN 500 MG PO TABS
1000.0000 mg | ORAL_TABLET | Freq: Three times a day (TID) | ORAL | Status: DC
  Administered 2016-08-25 – 2016-08-27 (×5): 1000 mg via ORAL
  Filled 2016-08-25 (×5): qty 2

## 2016-08-25 MED ORDER — HYDROCORTISONE NA SUCCINATE PF 100 MG IJ SOLR
70.0000 mg | Freq: Every day | INTRAMUSCULAR | Status: DC
Start: 2016-08-25 — End: 2016-08-25
  Administered 2016-08-25: 70 mg via INTRAVENOUS
  Filled 2016-08-25: qty 2

## 2016-08-25 MED ORDER — PROMETHAZINE HCL 25 MG PO TABS
25.0000 mg | ORAL_TABLET | Freq: Four times a day (QID) | ORAL | Status: DC | PRN
  Administered 2016-08-25 – 2016-08-26 (×2): 25 mg via ORAL
  Filled 2016-08-25 (×2): qty 1

## 2016-08-25 MED ORDER — MAGNESIUM CITRATE PO SOLN: 1.0000 | Freq: Once | ORAL | Status: DC | PRN

## 2016-08-25 MED ORDER — STERILE WATER FOR IRRIGATION IR SOLN
Status: DC | PRN
  Administered 2016-08-25: 2000 mL

## 2016-08-25 MED ORDER — LOSARTAN POTASSIUM 50 MG PO TABS
100.0000 mg | ORAL_TABLET | Freq: Every day | ORAL | Status: DC
  Administered 2016-08-25 – 2016-08-26 (×2): 100 mg via ORAL
  Filled 2016-08-25 (×3): qty 2

## 2016-08-25 MED ORDER — FENTANYL CITRATE (PF) 100 MCG/2ML IJ SOLN: 100.0000 ug | Freq: Once | INTRAMUSCULAR | Status: DC

## 2016-08-25 MED ORDER — MEPERIDINE HCL 50 MG/ML IJ SOLN: 6.2500 mg | INTRAMUSCULAR | Status: DC | PRN

## 2016-08-25 MED ORDER — PROPOFOL 10 MG/ML IV BOLUS
INTRAVENOUS | Status: AC
  Filled 2016-08-25: qty 20

## 2016-08-25 MED ORDER — TRANEXAMIC ACID 1000 MG/10ML IV SOLN
1000.0000 mg | INTRAVENOUS | Status: AC
  Administered 2016-08-25: 1000 mg via INTRAVENOUS
  Filled 2016-08-25: qty 1100

## 2016-08-25 MED ORDER — ONDANSETRON HCL 4 MG/2ML IJ SOLN
INTRAMUSCULAR | Status: AC
  Filled 2016-08-25: qty 2

## 2016-08-25 MED ORDER — HYDROMORPHONE HCL-NACL 0.5-0.9 MG/ML-% IV SOSY: 0.2500 mg | PREFILLED_SYRINGE | INTRAVENOUS | Status: DC | PRN

## 2016-08-25 MED ORDER — MENTHOL 3 MG MT LOZG: 1.0000 | LOZENGE | OROMUCOSAL | Status: DC | PRN

## 2016-08-25 MED ORDER — CELECOXIB 200 MG PO CAPS
200.0000 mg | ORAL_CAPSULE | Freq: Two times a day (BID) | ORAL | Status: DC
  Administered 2016-08-25 – 2016-08-27 (×4): 200 mg via ORAL
  Filled 2016-08-25 (×4): qty 1

## 2016-08-25 MED ORDER — SODIUM CHLORIDE 0.9 % IR SOLN
Status: DC | PRN
  Administered 2016-08-25: 1000 mL

## 2016-08-25 MED ORDER — SODIUM CHLORIDE 0.9 % IV SOLN
INTRAVENOUS | Status: DC
  Administered 2016-08-25 – 2016-08-26 (×2): via INTRAVENOUS

## 2016-08-25 MED ORDER — HYDROCORTISONE 5 MG PO TABS
5.0000 mg | ORAL_TABLET | Freq: Every day | ORAL | Status: DC
  Administered 2016-08-25 – 2016-08-26 (×2): 5 mg via ORAL
  Filled 2016-08-25 (×2): qty 1

## 2016-08-25 MED ORDER — DEXAMETHASONE SODIUM PHOSPHATE 10 MG/ML IJ SOLN: 10.0000 mg | Freq: Once | INTRAMUSCULAR | Status: DC

## 2016-08-25 MED ORDER — DEXAMETHASONE SODIUM PHOSPHATE 10 MG/ML IJ SOLN
10.0000 mg | Freq: Once | INTRAMUSCULAR | Status: AC
  Administered 2016-08-26: 10 mg via INTRAVENOUS
  Filled 2016-08-25: qty 1

## 2016-08-25 MED ORDER — DIPHENHYDRAMINE HCL 25 MG PO CAPS: 25.0000 mg | ORAL_CAPSULE | Freq: Four times a day (QID) | ORAL | Status: DC | PRN

## 2016-08-25 MED ORDER — MIDAZOLAM HCL 5 MG/ML IJ SOLN: 2.0000 mg | Freq: Once | INTRAMUSCULAR | Status: DC

## 2016-08-25 MED ORDER — HYDROCORTISONE 5 MG PO TABS: 5.0000 mg | ORAL_TABLET | Freq: Two times a day (BID) | ORAL | Status: DC

## 2016-08-25 MED ORDER — PROMETHAZINE HCL 25 MG/ML IJ SOLN: 6.2500 mg | INTRAMUSCULAR | Status: DC | PRN

## 2016-08-25 MED ORDER — METOCLOPRAMIDE HCL 5 MG PO TABS: 5.0000 mg | ORAL_TABLET | Freq: Three times a day (TID) | ORAL | Status: DC | PRN

## 2016-08-25 MED ORDER — BISACODYL 10 MG RE SUPP: 10.0000 mg | Freq: Every day | RECTAL | Status: DC | PRN

## 2016-08-25 MED ORDER — BUPIVACAINE-EPINEPHRINE (PF) 0.25% -1:200000 IJ SOLN
INTRAMUSCULAR | Status: DC | PRN
  Administered 2016-08-25: 30 mL

## 2016-08-25 MED ORDER — METOCLOPRAMIDE HCL 5 MG/ML IJ SOLN
5.0000 mg | Freq: Three times a day (TID) | INTRAMUSCULAR | Status: DC | PRN
  Administered 2016-08-26: 10 mg via INTRAVENOUS
  Filled 2016-08-25: qty 2

## 2016-08-25 MED ORDER — LOSARTAN POTASSIUM-HCTZ 100-12.5 MG PO TABS: 1.0000 | ORAL_TABLET | Freq: Every day | ORAL | Status: DC

## 2016-08-25 MED ORDER — FUROSEMIDE 40 MG PO TABS
40.0000 mg | ORAL_TABLET | Freq: Every day | ORAL | Status: DC | PRN
  Administered 2016-08-26: 40 mg via ORAL
  Filled 2016-08-25: qty 1

## 2016-08-25 MED ORDER — HYDROCHLOROTHIAZIDE 12.5 MG PO CAPS
12.5000 mg | ORAL_CAPSULE | Freq: Every day | ORAL | Status: DC
  Administered 2016-08-25 – 2016-08-26 (×2): 12.5 mg via ORAL
  Filled 2016-08-25 (×3): qty 1

## 2016-08-25 MED ORDER — FENTANYL CITRATE (PF) 100 MCG/2ML IJ SOLN
INTRAMUSCULAR | Status: AC
Start: 2016-08-25 — End: 2016-08-25
  Administered 2016-08-25: 50 ug
  Filled 2016-08-25: qty 2

## 2016-08-25 MED ORDER — SODIUM CHLORIDE 0.9 % IJ SOLN
INTRAMUSCULAR | Status: DC | PRN
  Administered 2016-08-25: 29 mL via INTRAVENOUS

## 2016-08-25 MED ORDER — KETOROLAC TROMETHAMINE 30 MG/ML IJ SOLN
INTRAMUSCULAR | Status: AC
  Filled 2016-08-25: qty 1

## 2016-08-25 MED ORDER — PROPOFOL 500 MG/50ML IV EMUL
INTRAVENOUS | Status: DC | PRN
  Administered 2016-08-25: 100 ug/kg/min via INTRAVENOUS

## 2016-08-25 MED ORDER — CHLORHEXIDINE GLUCONATE 4 % EX LIQD: 60.0000 mL | Freq: Once | CUTANEOUS | Status: DC

## 2016-08-25 MED ORDER — HYDROCORTISONE 10 MG PO TABS
10.0000 mg | ORAL_TABLET | Freq: Every day | ORAL | Status: DC
  Administered 2016-08-26 – 2016-08-27 (×2): 10 mg via ORAL
  Filled 2016-08-25 (×2): qty 1

## 2016-08-25 MED ORDER — ASPIRIN 81 MG PO CHEW
81.0000 mg | CHEWABLE_TABLET | Freq: Two times a day (BID) | ORAL | Status: DC
  Administered 2016-08-25 – 2016-08-27 (×4): 81 mg via ORAL
  Filled 2016-08-25 (×3): qty 1

## 2016-08-25 MED ORDER — ROPIVACAINE HCL 5 MG/ML IJ SOLN
INTRAMUSCULAR | Status: DC | PRN
  Administered 2016-08-25: 30 mL via PERINEURAL

## 2016-08-25 MED ORDER — LUBIPROSTONE 24 MCG PO CAPS
24.0000 ug | ORAL_CAPSULE | Freq: Two times a day (BID) | ORAL | Status: DC
  Administered 2016-08-26: 24 ug via ORAL
  Filled 2016-08-25 (×4): qty 1

## 2016-08-25 MED ORDER — KETOROLAC TROMETHAMINE 30 MG/ML IJ SOLN
INTRAMUSCULAR | Status: DC | PRN
  Administered 2016-08-25: 30 mg

## 2016-08-25 MED ORDER — PROPOFOL 10 MG/ML IV BOLUS
INTRAVENOUS | Status: AC
  Filled 2016-08-25: qty 40

## 2016-08-25 MED ORDER — TIZANIDINE HCL 4 MG PO TABS
4.0000 mg | ORAL_TABLET | Freq: Four times a day (QID) | ORAL | Status: DC | PRN
  Administered 2016-08-25 – 2016-08-27 (×5): 4 mg via ORAL
  Filled 2016-08-25 (×5): qty 1

## 2016-08-25 MED ORDER — TRANEXAMIC ACID 1000 MG/10ML IV SOLN
1000.0000 mg | Freq: Once | INTRAVENOUS | Status: AC
  Administered 2016-08-25: 1000 mg via INTRAVENOUS
  Filled 2016-08-25: qty 1100

## 2016-08-25 MED ORDER — MIDAZOLAM HCL 2 MG/2ML IJ SOLN
INTRAMUSCULAR | Status: AC
  Administered 2016-08-25: 2 mg
  Filled 2016-08-25: qty 2

## 2016-08-25 MED ORDER — LACTATED RINGERS IV SOLN: INTRAVENOUS | Status: DC

## 2016-08-25 MED ORDER — CARVEDILOL 12.5 MG PO TABS
12.5000 mg | ORAL_TABLET | Freq: Two times a day (BID) | ORAL | Status: DC
  Administered 2016-08-25 – 2016-08-26 (×3): 12.5 mg via ORAL
  Filled 2016-08-25 (×4): qty 1

## 2016-08-25 MED ORDER — FERROUS SULFATE 325 (65 FE) MG PO TABS
325.0000 mg | ORAL_TABLET | Freq: Three times a day (TID) | ORAL | Status: DC
  Administered 2016-08-25: 325 mg via ORAL
  Filled 2016-08-25: qty 1

## 2016-08-25 MED ORDER — LACTATED RINGERS IV SOLN
INTRAVENOUS | Status: DC
Start: 2016-08-25 — End: 2016-08-25
  Administered 2016-08-25 (×2): via INTRAVENOUS

## 2016-08-25 MED ORDER — BUPIVACAINE HCL (PF) 0.75 % IJ SOLN
INTRAMUSCULAR | Status: DC | PRN
  Administered 2016-08-25: 15 mg via INTRATHECAL

## 2016-08-25 MED ORDER — POLYETHYLENE GLYCOL 3350 17 G PO PACK: 17.0000 g | PACK | Freq: Two times a day (BID) | ORAL | Status: DC

## 2016-08-25 MED ORDER — PRAMIPEXOLE DIHYDROCHLORIDE 0.25 MG PO TABS
1.5000 mg | ORAL_TABLET | Freq: Every day | ORAL | Status: DC
  Administered 2016-08-25 – 2016-08-26 (×2): 1.5 mg via ORAL
  Filled 2016-08-25 (×2): qty 6

## 2016-08-25 MED ORDER — ONDANSETRON HCL 4 MG PO TABS
4.0000 mg | ORAL_TABLET | Freq: Four times a day (QID) | ORAL | Status: DC | PRN
  Administered 2016-08-26: 4 mg via ORAL
  Filled 2016-08-25: qty 1

## 2016-08-25 MED ORDER — ONDANSETRON HCL 4 MG/2ML IJ SOLN
INTRAMUSCULAR | Status: DC | PRN
  Administered 2016-08-25: 4 mg via INTRAVENOUS

## 2016-08-25 MED ORDER — PHENOL 1.4 % MT LIQD: 1.0000 | OROMUCOSAL | Status: DC | PRN

## 2016-08-25 SURGICAL SUPPLY — 46 items
BAG ZIPLOCK 12X15 (MISCELLANEOUS) ×2 IMPLANT
BANDAGE ACE 6X5 VEL STRL LF (GAUZE/BANDAGES/DRESSINGS) ×2 IMPLANT
BLADE SAW SGTL 11.0X1.19X90.0M (BLADE) IMPLANT
BLADE SAW SGTL 13.0X1.19X90.0M (BLADE) ×2 IMPLANT
BOWL SMART MIX CTS (DISPOSABLE) ×2 IMPLANT
CAPT KNEE TOTAL 3 ATTUNE ×2 IMPLANT
CEMENT HV SMART SET (Cement) ×4 IMPLANT
COVER SURGICAL LIGHT HANDLE (MISCELLANEOUS) ×2 IMPLANT
CUFF TOURN SGL QUICK 44 (TOURNIQUET CUFF) ×2 IMPLANT
DECANTER SPIKE VIAL GLASS SM (MISCELLANEOUS) ×2 IMPLANT
DERMABOND ADVANCED (GAUZE/BANDAGES/DRESSINGS) ×1
DERMABOND ADVANCED .7 DNX12 (GAUZE/BANDAGES/DRESSINGS) ×1 IMPLANT
DRAPE U-SHAPE 47X51 STRL (DRAPES) ×2 IMPLANT
DRESSING AQUACEL AG SP 3.5X10 (GAUZE/BANDAGES/DRESSINGS) ×1 IMPLANT
DRSG AQUACEL AG SP 3.5X10 (GAUZE/BANDAGES/DRESSINGS) ×2
DURAPREP 26ML APPLICATOR (WOUND CARE) ×4 IMPLANT
ELECT REM PT RETURN 15FT ADLT (MISCELLANEOUS) ×2 IMPLANT
GLOVE BIOGEL PI IND STRL 6.5 (GLOVE) ×1 IMPLANT
GLOVE BIOGEL PI IND STRL 7.0 (GLOVE) ×1 IMPLANT
GLOVE BIOGEL PI IND STRL 7.5 (GLOVE) ×4 IMPLANT
GLOVE BIOGEL PI IND STRL 8.5 (GLOVE) ×1 IMPLANT
GLOVE BIOGEL PI INDICATOR 6.5 (GLOVE) ×1
GLOVE BIOGEL PI INDICATOR 7.0 (GLOVE) ×1
GLOVE BIOGEL PI INDICATOR 7.5 (GLOVE) ×4
GLOVE BIOGEL PI INDICATOR 8.5 (GLOVE) ×1
GLOVE ECLIPSE 8.0 STRL XLNG CF (GLOVE) ×2 IMPLANT
GLOVE ORTHO TXT STRL SZ7.5 (GLOVE) ×4 IMPLANT
GLOVE SURG SS PI 7.5 STRL IVOR (GLOVE) ×2 IMPLANT
GOWN STRL REUS W/ TWL XL LVL3 (GOWN DISPOSABLE) ×1 IMPLANT
GOWN STRL REUS W/TWL LRG LVL3 (GOWN DISPOSABLE) ×2 IMPLANT
GOWN STRL REUS W/TWL XL LVL3 (GOWN DISPOSABLE) ×5 IMPLANT
HANDPIECE INTERPULSE COAX TIP (DISPOSABLE) ×1
MANIFOLD NEPTUNE II (INSTRUMENTS) ×2 IMPLANT
PACK TOTAL KNEE CUSTOM (KITS) ×2 IMPLANT
POSITIONER SURGICAL ARM (MISCELLANEOUS) ×2 IMPLANT
SET HNDPC FAN SPRY TIP SCT (DISPOSABLE) ×1 IMPLANT
SET PAD KNEE POSITIONER (MISCELLANEOUS) ×2 IMPLANT
SUT MNCRL AB 4-0 PS2 18 (SUTURE) ×2 IMPLANT
SUT STRATAFIX 0 PDS 27 VIOLET (SUTURE) ×2
SUT VIC AB 1 CT1 36 (SUTURE) ×4 IMPLANT
SUT VIC AB 2-0 CT1 27 (SUTURE) ×3
SUT VIC AB 2-0 CT1 TAPERPNT 27 (SUTURE) ×3 IMPLANT
SUTURE STRATFX 0 PDS 27 VIOLET (SUTURE) ×1 IMPLANT
TRAY FOLEY CATH SILVER 14FR (SET/KITS/TRAYS/PACK) ×2 IMPLANT
WRAP KNEE MAXI GEL POST OP (GAUZE/BANDAGES/DRESSINGS) ×2 IMPLANT
YANKAUER SUCT BULB TIP 10FT TU (MISCELLANEOUS) ×2 IMPLANT

## 2016-08-25 NOTE — Anesthesia Postprocedure Evaluation (Signed)
Anesthesia Post Note  Patient: Connie Haynes  Procedure(s) Performed: Procedure(s) (LRB): LEFT TOTAL KNEE ARTHROPLASTY (Left)     Patient location during evaluation: PACU Anesthesia Type: Spinal Level of consciousness: oriented and awake and alert Pain management: pain level controlled Vital Signs Assessment: post-procedure vital signs reviewed and stable Respiratory status: spontaneous breathing, respiratory function stable and patient connected to nasal cannula oxygen Cardiovascular status: blood pressure returned to baseline and stable Postop Assessment: no headache, no backache and spinal receding Anesthetic complications: no    Last Vitals:  Vitals:   08/25/16 1530 08/25/16 1550  BP: (!) 146/82 (!) 158/85  Pulse: (!) 55 (!) 55  Resp: 12 14  Temp: 36.9 C 36.7 C    Last Pain:  Vitals:   08/25/16 1657  TempSrc:   PainSc: [place] Najai Waszak

## 2016-08-25 NOTE — Interval H&P Note (Signed)
History and Physical Interval Note:  08/25/2016 11:17 AM  Connie Haynes  has presented today for surgery, with the diagnosis of Left knee osteoarthritis  The various methods of treatment have been discussed with the patient and family. After consideration of risks, benefits and other options for treatment, the patient has consented to  Procedure(s) with comments: LEFT TOTAL KNEE ARTHROPLASTY (Left) - 70 mins as a surgical intervention .  The patient's history has been reviewed, patient examined, no change in status, stable for surgery.  I have reviewed the patient's chart and labs.  Questions were answered to the patient's satisfaction.     Mauri Pole

## 2016-08-25 NOTE — Progress Notes (Signed)
Assisted Dr. Hollis with left, ultrasound guided, adductor canal block. Side rails up, monitors on throughout procedure. See vital signs in flow sheet. Tolerated Procedure well.  

## 2016-08-25 NOTE — Anesthesia Preprocedure Evaluation (Addendum)
Anesthesia Evaluation  Patient identified by MRN, date of birth, ID band Patient awake    Reviewed: Allergy & Precautions, NPO status , Patient's Chart, lab work & pertinent test results, reviewed documented beta blocker date and time   History of Anesthesia Complications (+) PONV and history of anesthetic complications  Airway Mallampati: III       Dental  (+) Teeth Intact, Dental Advisory Given   Pulmonary sleep apnea ,    breath sounds clear to auscultation       Cardiovascular hypertension, Pt. on home beta blockers and Pt. on medications  Rhythm:Regular Rate:Normal     Neuro/Psych PSYCHIATRIC DISORDERS Anxiety Depression CVA    GI/Hepatic Neg liver ROS, GERD  Medicated,  Endo/Other  negative endocrine ROS  Renal/GU Renal disease     Musculoskeletal  (+) Arthritis , Osteoarthritis,    Abdominal   Peds  Hematology negative hematology ROS (+)   Anesthesia Other Findings - Adrenal Insuff. (Follow recs per Endo)  Reproductive/Obstetrics negative OB ROS                            Lab Results  Component Value Date   WBC 9.5 08/19/2016   HGB 12.7 08/19/2016   HCT 37.6 08/19/2016   MCV 88.9 08/19/2016   PLT 269 08/19/2016   Lab Results  Component Value Date   CREATININE 1.13 (H) 08/19/2016   BUN 32 (H) 08/19/2016   NA 138 08/19/2016   K 3.9 08/19/2016   CL 104 08/19/2016   CO2 25 08/19/2016   Lab Results  Component Value Date   INR 0.96 05/07/2015   INR 0.87 07/28/2013   INR 0.9 07/27/2007   08/2016 EKG: normal sinus rhythm.  Anesthesia Physical Anesthesia Plan  ASA: III  Anesthesia Plan: Spinal   Post-op Pain Management:  Regional for Post-op pain   Induction: Intravenous  PONV Risk Score and Plan: 3 and Ondansetron, Dexamethasone, Propofol and Midazolam  Airway Management Planned: Natural Airway  Additional Equipment:   Intra-op Plan:   Post-operative Plan:    Informed Consent: I have reviewed the patients History and Physical, chart, labs and discussed the procedure including the risks, benefits and alternatives for the proposed anesthesia with the patient or authorized representative who has indicated his/her understanding and acceptance.   Dental advisory given  Plan Discussed with: CRNA  Anesthesia Plan Comments:         Anesthesia Quick Evaluation

## 2016-08-25 NOTE — Transfer of Care (Signed)
Immediate Anesthesia Transfer of Care Note  Patient: Connie Haynes  Procedure(s) Performed: Procedure(s) with comments: LEFT TOTAL KNEE ARTHROPLASTY (Left) - 70 mins; Adductor block  Patient Location: PACU  Anesthesia Type:Regional and Spinal  Level of Consciousness: sedated  Airway & Oxygen Therapy: Patient Spontanous Breathing and Patient connected to face mask oxygen  Post-op Assessment: Report given to RN and Post -op Vital signs reviewed and stable  Post vital signs: Reviewed and stable  Last Vitals:  Vitals:   08/25/16 1231 08/25/16 1232  BP:    Pulse: 67 63  Resp: 16 14  Temp:      Last Pain:  Vitals:   08/25/16 1040  TempSrc:   PainSc: 5       Patients Stated Pain Goal: 3 (59/47/07 6151)  Complications: No apparent anesthesia complications

## 2016-08-25 NOTE — Op Note (Signed)
NAME:  Connie Haynes                      MEDICAL RECORD NO.:  [id number]                             FACILITY:  [facility]      PHYSICIAN:  Pietro Cassis. Alvan Dame, M.D.  DATE OF BIRTH:  08/18/1955      DATE OF PROCEDURE:  08/25/2016                                     OPERATIVE REPORT         PREOPERATIVE DIAGNOSIS:  Left knee osteoarthritis.      POSTOPERATIVE DIAGNOSIS:  Left knee osteoarthritis.      FINDINGS:  The patient was noted to have complete loss of cartilage and   bone-on-bone arthritis with associated osteophytes in the medial and patellofemoral compartments of   the knee with a significant synovitis and associated effusion.      PROCEDURE:  Left total knee replacement.      COMPONENTS USED:  DePuy Attune rotating platform posterior stabilized knee   system, a size 5 femur, 5 tibia, size 7 PS AOX insert, and 38 anatomic patellar   button.      SURGEON:  Pietro Cassis. Alvan Dame, M.D.      ASSISTANT:  Danae Orleans, PA-C.      ANESTHESIA:  Regional and Spinal.      SPECIMENS:  None.      COMPLICATION:  None.      DRAINS: None.  EBL: <200cc      TOURNIQUET TIME:   Total Tourniquet Time Documented: Thigh (Left) - 32 minutes Total: Thigh (Left) - 32 minutes  .      The patient was stable to the recovery room.      INDICATION FOR PROCEDURE:  Connie Haynes is a 61 y.o. female patient of   mine.  The patient had been seen, evaluated, and treated conservatively in the   office with medication, activity modification, and injections.  The patient had   radiographic changes of bone-on-bone arthritis with endplate sclerosis and osteophytes noted.      The patient failed conservative measures including medication, injections, and activity modification, and at this point was ready for more definitive measures.   Based on the radiographic changes and failed conservative measures, the patient   decided to proceed with total knee replacement.  Risks of infection,   DVT, component  failure, need for revision surgery, postop course, and   expectations were all   discussed and reviewed.  Consent was obtained for benefit of pain   relief.      PROCEDURE IN DETAIL:  The patient was brought to the operative theater.   Once adequate anesthesia, preoperative antibiotics, 3 gm of Ancef, 1 gm of Tranexamic Acid, and 10 mg of Decadron administered, the patient was positioned supine with the left thigh tourniquet placed.  The  left lower extremity was prepped and draped in sterile fashion.  A time-   out was performed identifying the patient, planned procedure, and   extremity.      The left lower extremity was placed in the [facility] leg holder.  The leg was   exsanguinated, tourniquet elevated to 250 mmHg.  A midline incision was   made  followed by median parapatellar arthrotomy.  Following initial   exposure, attention was first directed to the patella.  Precut   measurement was noted to be 21 mm with significant degenerative changes over lateral facet.  I resected down to 13-14 mm and used a   38 anatomic patellar button to restore patellar height as well as cover the cut   surface.      The lug holes were drilled and a metal shim was placed to protect the   patella from retractors and saw blades.      At this point, attention was now directed to the femur.  The femoral   canal was opened with a drill, irrigated to try to prevent fat emboli.  An   intramedullary rod was passed at 3 degrees valgus, 9 mm of bone was   resected off the distal femur.  Following this resection, the tibia was   subluxated anteriorly.  Using the extramedullary guide, 2 mm of bone was resected off   the proximal medial tibia.  We confirmed the gap would be   stable medially and laterally with a size 5 spacer block as well as confirmed   the cut was perpendicular in the coronal plane, checking with an alignment rod.      Once this was done, I sized the femur to be a size 5 in the anterior-    posterior dimension, chose a standard component based on medial and   lateral dimension.  The size 5 rotation block was then pinned in   position anterior referenced using the C-clamp to set rotation.  The   anterior, posterior, and  chamfer cuts were made without difficulty nor   notching making certain that I was along the anterior cortex to help   with flexion gap stability.      The final box cut was made off the lateral aspect of distal femur.      At this point, the tibia was sized to be a size 5, the size 5 tray was   then pinned in position through the medial third of the tubercle,   drilled, and keel punched.  Trial reduction was now carried with a 5 femur,  5 tibia, a size 6 then 7 PS insert, and the 38 anatomic patella botton.  The knee was brought to   extension, full extension with good flexion stability with the patella   tracking through the trochlea without application of pressure.  Given   all these findings the femoral lug holes were drilled and then the trial components removed.  Final components were   opened and cement was mixed.  The knee was irrigated with normal saline   solution and pulse lavage.  The synovial lining was   then injected with 30 cc of 0.25% Marcaine with epinephrine and 1 cc of Toradol plus 30 cc of NS for a  total of 61 cc.      The knee was irrigated.  Final implants were then cemented onto clean and   dried cut surfaces of bone with the knee brought to extension with a size 7 PS trial insert.      Once the cement had fully cured, the excess cement was removed   throughout the knee.  I confirmed I was satisfied with the range of   motion and stability, and the final size 7 PS AOX insert was chosen.  It was   placed into the knee.      The  tourniquet had been let down at 32 minutes.  No significant   hemostasis required.  The   extensor mechanism was then reapproximated using #1 Vicryl #0 V-lock sutures with the knee   in flexion.  The    remaining wound was closed with 2-0 Vicryl and running 4-0 Monocryl.   The knee was cleaned, dried, dressed sterilely using Dermabond and   Aquacel dressing.  The patient was then   brought to recovery room in stable condition, tolerating the procedure   well.   Please note that Physician Assistant, Danae Orleans, PA-C, was present for the entirety of the case, and was utilized for pre-operative positioning, peri-operative retractor management, general facilitation of the procedure.  He was also utilized for primary wound closure at the end of the case.              Pietro Cassis Alvan Dame, M.D.    08/25/2016 4:13 PM

## 2016-08-25 NOTE — Anesthesia Procedure Notes (Signed)
Anesthesia Regional Block: Adductor canal block   Pre-Anesthetic Checklist: ,, timeout performed, Correct Patient, Correct Site, Correct Laterality, Correct Procedure, Correct Position, site marked, Risks and benefits discussed,  Surgical consent,  Pre-op evaluation,  At surgeon's request and post-op pain management  Laterality: Left  Prep: chloraprep       Needles:  Injection technique: Single-shot  Needle Type: Echogenic Needle     Needle Length: 9cm  Needle Gauge: 21     Additional Needles:   Procedures: ultrasound guided,,,,,,,,  Narrative:  Start time: 08/25/2016 12:30 PM End time: 08/25/2016 12:34 PM Injection made incrementally with aspirations every 5 mL.  Performed by: Personally  Anesthesiologist: Suella Broad D  Additional Notes: Tolerated well.

## 2016-08-26 DIAGNOSIS — Z96643 Presence of artificial hip joint, bilateral: Secondary | ICD-10-CM | POA: Diagnosis present

## 2016-08-26 DIAGNOSIS — I10 Essential (primary) hypertension: Secondary | ICD-10-CM | POA: Diagnosis present

## 2016-08-26 DIAGNOSIS — Z85828 Personal history of other malignant neoplasm of skin: Secondary | ICD-10-CM | POA: Diagnosis not present

## 2016-08-26 DIAGNOSIS — Z806 Family history of leukemia: Secondary | ICD-10-CM | POA: Diagnosis not present

## 2016-08-26 DIAGNOSIS — Z885 Allergy status to narcotic agent status: Secondary | ICD-10-CM | POA: Diagnosis not present

## 2016-08-26 DIAGNOSIS — I456 Pre-excitation syndrome: Secondary | ICD-10-CM | POA: Diagnosis present

## 2016-08-26 DIAGNOSIS — Z8349 Family history of other endocrine, nutritional and metabolic diseases: Secondary | ICD-10-CM | POA: Diagnosis not present

## 2016-08-26 DIAGNOSIS — E785 Hyperlipidemia, unspecified: Secondary | ICD-10-CM | POA: Diagnosis present

## 2016-08-26 DIAGNOSIS — Z90710 Acquired absence of both cervix and uterus: Secondary | ICD-10-CM | POA: Diagnosis not present

## 2016-08-26 DIAGNOSIS — Z7952 Long term (current) use of systemic steroids: Secondary | ICD-10-CM | POA: Diagnosis not present

## 2016-08-26 DIAGNOSIS — M1712 Unilateral primary osteoarthritis, left knee: Secondary | ICD-10-CM | POA: Diagnosis present

## 2016-08-26 DIAGNOSIS — M25562 Pain in left knee: Secondary | ICD-10-CM | POA: Diagnosis present

## 2016-08-26 DIAGNOSIS — Z87442 Personal history of urinary calculi: Secondary | ICD-10-CM | POA: Diagnosis not present

## 2016-08-26 DIAGNOSIS — Z801 Family history of malignant neoplasm of trachea, bronchus and lung: Secondary | ICD-10-CM | POA: Diagnosis not present

## 2016-08-26 DIAGNOSIS — E662 Morbid (severe) obesity with alveolar hypoventilation: Secondary | ICD-10-CM | POA: Diagnosis present

## 2016-08-26 DIAGNOSIS — Z6841 Body Mass Index (BMI) 40.0 and over, adult: Secondary | ICD-10-CM | POA: Diagnosis not present

## 2016-08-26 DIAGNOSIS — Z96612 Presence of left artificial shoulder joint: Secondary | ICD-10-CM | POA: Diagnosis present

## 2016-08-26 DIAGNOSIS — G2581 Restless legs syndrome: Secondary | ICD-10-CM | POA: Diagnosis present

## 2016-08-26 DIAGNOSIS — K219 Gastro-esophageal reflux disease without esophagitis: Secondary | ICD-10-CM | POA: Diagnosis present

## 2016-08-26 DIAGNOSIS — Z8673 Personal history of transient ischemic attack (TIA), and cerebral infarction without residual deficits: Secondary | ICD-10-CM | POA: Diagnosis not present

## 2016-08-26 DIAGNOSIS — Z8249 Family history of ischemic heart disease and other diseases of the circulatory system: Secondary | ICD-10-CM | POA: Diagnosis not present

## 2016-08-26 DIAGNOSIS — Z79891 Long term (current) use of opiate analgesic: Secondary | ICD-10-CM | POA: Diagnosis not present

## 2016-08-26 DIAGNOSIS — Z96651 Presence of right artificial knee joint: Secondary | ICD-10-CM | POA: Diagnosis present

## 2016-08-26 DIAGNOSIS — Z96611 Presence of right artificial shoulder joint: Secondary | ICD-10-CM | POA: Diagnosis present

## 2016-08-26 DIAGNOSIS — Z981 Arthrodesis status: Secondary | ICD-10-CM | POA: Diagnosis not present

## 2016-08-26 LAB — CBC
HCT: 33.2 % — ABNORMAL LOW (ref 36.0–46.0)
Hemoglobin: 10.9 g/dL — ABNORMAL LOW (ref 12.0–15.0)
MCH: 29.8 pg (ref 26.0–34.0)
MCHC: 32.8 g/dL (ref 30.0–36.0)
MCV: 90.7 fL (ref 78.0–100.0)
Platelets: 257 10*3/uL (ref 150–400)
RBC: 3.66 MIL/uL — ABNORMAL LOW (ref 3.87–5.11)
RDW: 13.1 % (ref 11.5–15.5)
WBC: 8.9 10*3/uL (ref 4.0–10.5)

## 2016-08-26 LAB — BASIC METABOLIC PANEL
Anion gap: 11 (ref 5–15)
BUN: 29 mg/dL — ABNORMAL HIGH (ref 6–20)
CO2: 23 mmol/L (ref 22–32)
Calcium: 8.3 mg/dL — ABNORMAL LOW (ref 8.9–10.3)
Chloride: 106 mmol/L (ref 101–111)
Creatinine, Ser: 1.77 mg/dL — ABNORMAL HIGH (ref 0.44–1.00)
GFR calc Af Amer: 34 mL/min — ABNORMAL LOW (ref >60)
GFR calc non Af Amer: 29 mL/min — ABNORMAL LOW (ref >60)
Glucose, Bld: 129 mg/dL — ABNORMAL HIGH (ref 65–99)
Potassium: 3.8 mmol/L (ref 3.5–5.1)
Sodium: 140 mmol/L (ref 135–145)

## 2016-08-26 NOTE — Care Management Note (Signed)
Case Management Note  Patient Details  Name: Connie Haynes MRN: [id number] Date of Birth: [birth year]  Subjective/Objective: 61 y/o f admitted for L TKA. From home. Has 3n1,rw.PT-recc-f/u plan as arranged by surgeon. Otpt PT. No further CM needs.                   Action/Plan:d/c home.   Expected Discharge Date:                  Expected Discharge Plan:  OP Rehab  In-House Referral:     Discharge planning Services  CM Consult  Post Acute Care Choice:    Choice offered to:     DME Arranged:    DME Agency:     HH Arranged:    [place] Agency:     Status of Service:  Completed, signed off  If discussed at [employer] of Stay Meetings, dates discussed:    Additional Comments:  Dessa Phi, RN 08/26/2016, 12:59 PM

## 2016-08-26 NOTE — Evaluation (Signed)
Occupational Therapy Evaluation and Discharge Patient Details Name: Connie Haynes MRN: [id number] DOB: 11/20/1955 Today's Date: 08/26/2016    History of Present Illness Pt s/p L TKR and with hx of bil TSR and R TKR   Clinical Impression   Pt reports she was independent with ADL PTA. Currently pt overall supervision for ADL and functional mobility with the exception of min assist for LB dressing. All knee, safety, and ADL education completed-pt reports she recalls much information from prior sxs. Pt planning to d/c home with supervision from family. No further acute OT needs identified; signing off at this time. Please re-consult if needs change. Thank you for this referral.    Follow Up Recommendations  No OT follow up;Supervision/Assistance - 24 hour (initially)    Equipment Recommendations  None recommended by OT    Recommendations for Other Services       Precautions / Restrictions Precautions Precautions: Knee;Fall Precaution Booklet Issued: No Restrictions Weight Bearing Restrictions: Yes LLE Weight Bearing: Weight bearing as tolerated Other Position/Activity Restrictions: WBAT      Mobility Bed Mobility Overal bed mobility: Needs Assistance Bed Mobility: Supine to Sit;Sit to Supine     Supine to sit: Supervision;HOB elevated Sit to supine: Supervision;HOB elevated   General bed mobility comments: Supervision for safety; no physical assist required. Increased time needed  Transfers Overall transfer level: Needs assistance Equipment used: Rolling walker (2 wheeled) Transfers: Sit to/from Stand Sit to Stand: Supervision         General transfer comment: Supervision for safety with increased time required. Cues for hand placement and technique    Balance Overall balance assessment: Needs assistance Sitting-balance support: Feet supported;No upper extremity supported Sitting balance-Leahy Scale: Good     Standing balance support: Bilateral upper extremity  supported Standing balance-Leahy Scale: Fair                             ADL either performed or assessed with clinical judgement   ADL Overall ADL's : Needs assistance/impaired Eating/Feeding: Independent;Sitting   Grooming: Set up;Sitting   Upper Body Bathing: Set up;Sitting   Lower Body Bathing: Supervison/ safety;Sit to/from stand   Upper Body Dressing : Set up;Sitting   Lower Body Dressing: Minimal assistance;Sit to/from stand Lower Body Dressing Details (indicate cue type and reason): Pt almost able to reach L foot in sitting. Educated on compensatory strategies for LB ADL. Husband to assist as needed. Toilet Transfer: Supervision/safety;Ambulation;BSC;RW   Toileting- Clothing Manipulation and Hygiene: Supervision/safety;Sit to/from Nurse, children's Details (indicate cue type and reason): Educated on walk in shower transfer technique, pt reports she went in side ways last TKA. Educated on use of 3 in 1 in shower as a seat if needed; pt and husband verbalized understanding Functional mobility during ADLs: Supervision/safety;Rolling walker       Vision         Perception     Praxis      Pertinent Vitals/Pain Pain Assessment: Faces Pain Score: 6  Faces Pain Scale: Hurts even more Pain Location: L knee Pain Descriptors / Indicators: Aching;Sore Pain Intervention(s): Monitored during session;Premedicated before session     Hand Dominance     Extremity/Trunk Assessment Upper Extremity Assessment Upper Extremity Assessment: Overall WFL for tasks assessed   Lower Extremity Assessment Lower Extremity Assessment: Defer to PT evaluation    Cervical / Trunk Assessment Cervical / Trunk Assessment: Normal   Communication Communication Communication: No  difficulties   Cognition Arousal/Alertness: Awake/alert Behavior During Therapy: WFL for tasks assessed/performed Overall Cognitive Status: Within Functional Limits for tasks assessed                                      General Comments       Exercises    Shoulder Instructions      Home Living Family/patient expects to be discharged to:: Private residence Living Arrangements: Spouse/significant other Available Help at Discharge: Family Type of Home: House Home Access: Stairs to enter [employer] of Steps: 1 Entrance Stairs-Rails: None Home Layout: Two level;Able to live on main level with bedroom/bathroom     Bathroom Shower/Tub: Occupational psychologist: Standard     Home Equipment: Environmental consultant - 2 wheels;Cane - single point;Bedside commode          Prior Functioning/Environment Level of Independence: Independent                 OT Problem List:        OT Treatment/Interventions:      OT Goals(Current goals can be found in the care plan section) Acute Rehab OT Goals Patient Stated Goal: Regain IND OT Goal Formulation: All assessment and education complete, DC therapy  OT Frequency:     Barriers to D/C:            Co-evaluation              AM-PAC PT "6 Clicks" Daily Activity     Outcome Measure Help from another person eating meals?: None Help from another person taking care of personal grooming?: A Little Help from another person toileting, which includes using toliet, bedpan, or urinal?: A Little Help from another person bathing (including washing, rinsing, drying)?: A Little Help from another person to put on and taking off regular upper body clothing?: None Help from another person to put on and taking off regular lower body clothing?: A Little 6 Click Score: 20   End of Session Equipment Utilized During Treatment: Rolling walker  Activity Tolerance: Patient tolerated treatment well Patient left: in bed;with call bell/phone within reach  OT Visit Diagnosis: Other abnormalities of gait and mobility (R26.89);Pain Pain - Right/Left: Left Pain - part of body: Knee                Time:  1350-1407 OT Time Calculation (min): 17 min Charges:  OT General Charges $OT Visit: 1 Procedure OT Evaluation $OT Eval Moderate Complexity: 1 Procedure G-Codes: OT G-codes **NOT FOR INPATIENT CLASS** Functional Assessment Tool Used: Clinical judgement Functional Limitation: Self care Self Care Current Status (X5170): At least 1 percent but less than 20 percent impaired, limited or restricted Self Care Goal Status (Y1749): At least 1 percent but less than 20 percent impaired, limited or restricted Self Care Discharge Status [phone]): At least 1 percent but less than 20 percent impaired, limited or restricted   Mel Almond A. Ulice Brilliant, M.S., OTR/L Pager: [place] 08/26/2016, 2:14 PM

## 2016-08-26 NOTE — Evaluation (Signed)
Physical Therapy Evaluation Patient Details Name: Connie Haynes MRN: [id number] DOB: 01/02/1956 Today's Date: 08/26/2016   History of Present Illness  Pt s/p L TKR and with hx of bil TSR and R TKR  Clinical Impression  Pt s/p L TKR and presents with decreased L LE strength/ROM and post op pain limiting functional mobility.  Pt should progress to dc home with family assist.    Follow Up Recommendations DC plan and follow up therapy as arranged by surgeon    Equipment Recommendations  None recommended by PT    Recommendations for Other Services OT consult     Precautions / Restrictions Precautions Precautions: Knee;Fall Restrictions Weight Bearing Restrictions: No Other Position/Activity Restrictions: WBAT      Mobility  Bed Mobility Overal bed mobility: Needs Assistance Bed Mobility: Supine to Sit     Supine to sit: Min guard     General bed mobility comments: cues for sequence and use of R LE to self assist  Transfers Overall transfer level: Needs assistance Equipment used: Rolling walker (2 wheeled) Transfers: Sit to/from Stand Sit to Stand: Min assist         General transfer comment: cues for LE management and use of UEs to self assist  Ambulation/Gait Ambulation/Gait assistance: Min assist Ambulation Distance (Feet): 50 Feet Assistive device: Rolling walker (2 wheeled) Gait Pattern/deviations: Step-to pattern;Decreased step length - right;Shuffle;Trunk flexed Gait velocity: decr Gait velocity interpretation: Below normal speed for age/gender General Gait Details: cues for sequence, posture and position from [employer]            Wheelchair Mobility    Modified Rankin (Stroke Patients Only)       Balance                                             Pertinent Vitals/Pain Pain Assessment: 0-10 Pain Score: 6  Pain Location: L knee Pain Descriptors / Indicators: Aching;Sore Pain Intervention(s): Limited activity within  patient's tolerance;Monitored during session;Premedicated before session;Ice applied    Home Living Family/patient expects to be discharged to:: Private residence Living Arrangements: Spouse/significant other Available Help at Discharge: Family Type of Home: House Home Access: Stairs to enter Entrance Stairs-Rails: None Entrance Stairs-Number of Steps: 1 Home Layout: Two level;Able to live on main level with bedroom/bathroom Home Equipment: Gilford Rile - 2 wheels;Cane - single point;Bedside commode      Prior Function Level of Independence: Independent               Hand Dominance        Extremity/Trunk Assessment   Upper Extremity Assessment Upper Extremity Assessment: Overall WFL for tasks assessed    Lower Extremity Assessment Lower Extremity Assessment: LLE deficits/detail LLE Deficits / Details: 3/5 quads with IND SLR and AAROM at knee -10 - 70    Cervical / Trunk Assessment Cervical / Trunk Assessment: Normal  Communication   Communication: No difficulties  Cognition Arousal/Alertness: Awake/alert Behavior During Therapy: WFL for tasks assessed/performed Overall Cognitive Status: Within Functional Limits for tasks assessed                                        General Comments      Exercises Total Joint Exercises Ankle Circles/Pumps: AROM;Both;15 reps;Supine Quad Sets: AROM;Both;10 reps;Supine Heel  Slides: AAROM;Left;15 reps;Supine Straight Leg Raises: AAROM;AROM;Left;15 reps;Supine   Assessment/Plan    PT Assessment Patient needs continued PT services  PT Problem List Decreased strength;Decreased range of motion;Decreased activity tolerance;Decreased mobility;Pain;Obesity;Decreased knowledge of use of DME       PT Treatment Interventions DME instruction;Gait training;Stair training;Functional mobility training;Therapeutic activities;Therapeutic exercise;Patient/family education    PT Goals (Current goals can be found in the Care  Plan section)  Acute Rehab PT Goals Patient Stated Goal: Regain IND PT Goal Formulation: With patient Time For Goal Achievement: 08/29/16 Potential to Achieve Goals: Good    Frequency 7X/week   Barriers to discharge        Co-evaluation               AM-PAC PT "6 Clicks" Daily Activity  Outcome Measure Difficulty turning over in bed (including adjusting bedclothes, sheets and blankets)?: A Little Difficulty moving from lying on back to sitting on the side of the bed? : A Little Difficulty sitting down on and standing up from a chair with arms (e.g., wheelchair, bedside commode, etc,.)?: A Little Help needed moving to and from a bed to chair (including a wheelchair)?: A Little Help needed walking in hospital room?: A Little Help needed climbing 3-5 steps with a railing? : A Little 6 Click Score: 18    End of Session Equipment Utilized During Treatment: Gait belt Activity Tolerance: Patient tolerated treatment well Patient left: in chair;with call bell/phone within reach;with family/visitor present Nurse Communication: Mobility status PT Visit Diagnosis: Difficulty in walking, not elsewhere classified (R26.2)    Time: 2620-3559 PT Time Calculation (min) (ACUTE ONLY): 29 min   Charges:   PT Evaluation $PT Eval Low Complexity: 1 Procedure PT Treatments $Therapeutic Exercise: 8-22 mins   PT G Codes:        Pg [phone]   Connie Haynes 08/26/2016, 12:49 PM

## 2016-08-26 NOTE — Progress Notes (Signed)
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  Pt hopeful for dc home tomorrow.   Follow Up Recommendations  DC plan and follow up therapy as arranged by surgeon     Equipment Recommendations  None recommended by PT    Recommendations for Other Services OT consult     Precautions / Restrictions Precautions Precautions: Knee;Fall Precaution Booklet Issued: No Restrictions Weight Bearing Restrictions: No LLE Weight Bearing: Weight bearing as tolerated Other Position/Activity Restrictions: WBAT    Mobility  Bed Mobility Overal bed mobility: Needs Assistance Bed Mobility: Supine to Sit;Sit to Supine     Supine to sit: Supervision;HOB elevated Sit to supine: Supervision;HOB elevated   General bed mobility comments: Supervision for safety; no physical assist required. Increased time needed  Transfers Overall transfer level: Needs assistance Equipment used: Rolling walker (2 wheeled) Transfers: Sit to/from Stand Sit to Stand: Supervision         General transfer comment: Supervision for safety with increased time required. Cues for hand placement and technique  Ambulation/Gait Ambulation/Gait assistance: Min guard Ambulation Distance (Feet): 100 Feet Assistive device: Rolling walker (2 wheeled) Gait Pattern/deviations: Step-to pattern;Decreased step length - right;Shuffle;Trunk flexed Gait velocity: decr Gait velocity interpretation: Below normal speed for age/gender General Gait Details: cues for sequence, posture and position from [employer]            Wheelchair Mobility    Modified Rankin (Stroke Patients Only)       Balance Overall balance assessment: Needs assistance Sitting-balance support: Feet supported;No upper extremity  supported Sitting balance-Leahy Scale: Good     Standing balance support: Bilateral upper extremity supported Standing balance-Leahy Scale: Fair                              Cognition Arousal/Alertness: Awake/alert Behavior During Therapy: WFL for tasks assessed/performed Overall Cognitive Status: Within Functional Limits for tasks assessed                                        Exercises Total Joint Exercises Ankle Circles/Pumps: AROM;Both;15 reps;Supine Quad Sets: AROM;Both;10 reps;Supine Heel Slides: AAROM;Left;15 reps;Supine Straight Leg Raises: AAROM;AROM;Left;15 reps;Supine    General Comments        Pertinent Vitals/Pain Pain Assessment: 0-10 Pain Score: 6  Faces Pain Scale: Hurts even more Pain Location: L knee Pain Descriptors / Indicators: Aching;Sore Pain Intervention(s): Limited activity within patient's tolerance;Monitored during session;Premedicated before session;Ice applied    Home Living Family/patient expects to be discharged to:: Private residence Living Arrangements: Spouse/significant other Available Help at Discharge: Family Type of Home: House Home Access: Stairs to enter Entrance Stairs-Rails: None Home Layout: Two level;Able to live on main level with bedroom/bathroom Home Equipment: Gilford Rile - 2 wheels;Cane - single point;Bedside commode      Prior Function Level of Independence: Independent          PT Goals (current goals can now be found in the care plan section) Acute Rehab PT Goals Patient Stated Goal: Regain IND PT Goal Formulation: With patient Time For Goal Achievement: 08/29/16 Potential to Achieve Goals: Good Progress towards PT  goals: Progressing toward goals    Frequency    7X/week      PT Plan Current plan remains appropriate    Co-evaluation              AM-PAC PT "6 Clicks" Daily Activity  Outcome Measure  Difficulty turning over in bed (including adjusting bedclothes,  sheets and blankets)?: A Little Difficulty moving from lying on back to sitting on the side of the bed? : A Little Difficulty sitting down on and standing up from a chair with arms (e.g., wheelchair, bedside commode, etc,.)?: A Little Help needed moving to and from a bed to chair (including a wheelchair)?: A Little Help needed walking in hospital room?: A Little Help needed climbing 3-5 steps with a railing? : A Little 6 Click Score: 18    End of Session Equipment Utilized During Treatment: Gait belt Activity Tolerance: Patient tolerated treatment well Patient left: with call bell/phone within reach;with family/visitor present;in bed Nurse Communication: Mobility status PT Visit Diagnosis: Difficulty in walking, not elsewhere classified (R26.2)     Time: 2703-5009 PT Time Calculation (min) (ACUTE ONLY): 31 min  Charges:  $Gait Training: 8-22 mins $Therapeutic Exercise: 8-22 mins                    G Codes:       Pg [phone]    Ameriah Lint 08/26/2016, 3:22 PM

## 2016-08-26 NOTE — Progress Notes (Signed)
     Subjective: 1 Day Post-Op Procedure(s) (LRB): LEFT TOTAL KNEE ARTHROPLASTY (Left)   Patient reports pain as moderate, pain controlled. Had a rough night because of pain.  No other events throughout the night.   Objective:   VITALS:   Vitals:   08/26/16 0228 08/26/16 0447  BP: 114/62 (!) 145/81  Pulse: 61 69  Resp: 15 16  Temp: 98.7 F (37.1 C) 98.6 F (37 C)    Dorsiflexion/Plantar flexion intact Incision: dressing C/D/I No cellulitis present Compartment soft  LABS  Recent Labs  08/26/16 0615  HGB 10.9*  HCT 33.2*  WBC 8.9  PLT 257     Recent Labs  08/26/16 0615  NA 140  K 3.8  BUN 29*  CREATININE 1.77*  GLUCOSE 129*     Assessment/Plan: 1 Day Post-Op Procedure(s) (LRB): LEFT TOTAL KNEE ARTHROPLASTY (Left) Up with therapy Discharge home eventually when ready  Morbid Obesity (BMI >40)  Estimated body mass index is 44.93 kg/m as calculated from the following:   Height as of this encounter: 5\' 5"  (1.651 m).   Weight as of this encounter: 122.5 kg (270 lb). Patient also counseled that weight may inhibit the healing process Patient counseled that losing weight will help with future health issues      West Pugh. Amous Crewe   PAC  08/26/2016, 8:54 AM

## 2016-08-27 LAB — BASIC METABOLIC PANEL
Anion gap: 12 (ref 5–15)
BUN: 37 mg/dL — ABNORMAL HIGH (ref 6–20)
CO2: 22 mmol/L (ref 22–32)
Calcium: 8.4 mg/dL — ABNORMAL LOW (ref 8.9–10.3)
Chloride: 103 mmol/L (ref 101–111)
Creatinine, Ser: 1.88 mg/dL — ABNORMAL HIGH (ref 0.44–1.00)
GFR calc Af Amer: 32 mL/min — ABNORMAL LOW (ref >60)
GFR calc non Af Amer: 27 mL/min — ABNORMAL LOW (ref >60)
Glucose, Bld: 124 mg/dL — ABNORMAL HIGH (ref 65–99)
Potassium: 3.8 mmol/L (ref 3.5–5.1)
Sodium: 137 mmol/L (ref 135–145)

## 2016-08-27 LAB — CBC
HCT: 30.2 % — ABNORMAL LOW (ref 36.0–46.0)
Hemoglobin: 10.1 g/dL — ABNORMAL LOW (ref 12.0–15.0)
MCH: 30.1 pg (ref 26.0–34.0)
MCHC: 33.4 g/dL (ref 30.0–36.0)
MCV: 90.1 fL (ref 78.0–100.0)
Platelets: 224 10*3/uL (ref 150–400)
RBC: 3.35 MIL/uL — ABNORMAL LOW (ref 3.87–5.11)
RDW: 13 % (ref 11.5–15.5)
WBC: 11.2 10*3/uL — ABNORMAL HIGH (ref 4.0–10.5)

## 2016-08-27 MED ORDER — OXYCODONE HCL 5 MG PO TABS: 5.0000 mg | ORAL_TABLET | ORAL | 0 refills | Status: DC | PRN

## 2016-08-27 MED ORDER — POLYETHYLENE GLYCOL 3350 17 G PO PACK: 17.0000 g | PACK | Freq: Two times a day (BID) | ORAL | 0 refills | Status: DC

## 2016-08-27 MED ORDER — DOCUSATE SODIUM 100 MG PO CAPS: 100.0000 mg | ORAL_CAPSULE | Freq: Two times a day (BID) | ORAL | 0 refills | Status: DC

## 2016-08-27 MED ORDER — TIZANIDINE HCL 4 MG PO TABS: 6.0000 mg | ORAL_TABLET | Freq: Three times a day (TID) | ORAL | 0 refills | Status: DC | PRN

## 2016-08-27 MED ORDER — ACETAMINOPHEN 500 MG PO TABS: 1000.0000 mg | ORAL_TABLET | Freq: Three times a day (TID) | ORAL | 0 refills | Status: DC

## 2016-08-27 MED ORDER — FERROUS SULFATE 325 (65 FE) MG PO TABS: 325.0000 mg | ORAL_TABLET | Freq: Three times a day (TID) | ORAL | 3 refills | Status: DC

## 2016-08-27 MED ORDER — ASPIRIN 81 MG PO CHEW: 81.0000 mg | CHEWABLE_TABLET | Freq: Two times a day (BID) | ORAL | 0 refills | Status: AC

## 2016-08-27 NOTE — Progress Notes (Signed)
     Subjective: 2 Days Post-Op Procedure(s) (LRB): LEFT TOTAL KNEE ARTHROPLASTY (Left)   Patient reports pain as mild, pain controlled. No events throughout the night.  She slept better last night.  Ready to be discharged home.   Objective:   VITALS:   Vitals:   08/27/16 0617 08/27/16 0933  BP: 122/62 97/63  Pulse: 65 65  Resp: 14   Temp: 97.6 F (36.4 C)     Dorsiflexion/Plantar flexion intact Incision: dressing C/D/I No cellulitis present Compartment soft  LABS  Recent Labs  08/26/16 0615 08/27/16 0506  HGB 10.9* 10.1*  HCT 33.2* 30.2*  WBC 8.9 11.2*  PLT 257 224     Recent Labs  08/26/16 0615 08/27/16 0506  NA 140 137  K 3.8 3.8  BUN 29* 37*  CREATININE 1.77* 1.88*  GLUCOSE 129* 124*     Assessment/Plan: 2 Days Post-Op Procedure(s) (LRB): LEFT TOTAL KNEE ARTHROPLASTY (Left)  Up with therapy Discharge home  Follow up in 2 weeks at [facility]. Follow up with OLIN,Trevor Duty D in 2 weeks.  Contact information:  [facility] [address] [place] Connie Haynes   PAC  08/27/2016, 10:48 AM

## 2016-08-27 NOTE — Progress Notes (Signed)
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  Pt to OP PT tomorrow.   Follow Up Recommendations  DC plan and follow up therapy as arranged by surgeon     Equipment Recommendations  None recommended by PT    Recommendations for Other Services OT consult     Precautions / Restrictions Precautions Precautions: Knee;Fall Precaution Booklet Issued: No Restrictions Weight Bearing Restrictions: No LLE Weight Bearing: Weight bearing as tolerated    Mobility  Bed Mobility Overal bed mobility: Needs Assistance Bed Mobility: Supine to Sit     Supine to sit: Supervision;HOB elevated     General bed mobility comments: Supervision for safety; no physical assist required. Increased time needed  Transfers Overall transfer level: Needs assistance Equipment used: Rolling walker (2 wheeled) Transfers: Sit to/from Stand Sit to Stand: Supervision         General transfer comment: Supervision for safety with increased time required. Cues for hand placement and technique  Ambulation/Gait Ambulation/Gait assistance: Min guard;Supervision Ambulation Distance (Feet): 120 Feet (and 15' into bathroom) Assistive device: Rolling walker (2 wheeled) Gait Pattern/deviations: Step-to pattern;Decreased step length - right;Shuffle;Trunk flexed Gait velocity: decr Gait velocity interpretation: Below normal speed for age/gender General Gait Details: cues for posture and position from RW   Stairs Stairs: Yes   Stair Management: No rails;Step to pattern;Backwards;Forwards;With walker Number of Stairs: 2 General stair comments: Demonstrated stairs for spouse with written instructions provided.  Pt declined "I have to do it when I get home"  Wheelchair Mobility    Modified  Rankin (Stroke Patients Only)       Balance Overall balance assessment: Needs assistance Sitting-balance support: Feet supported;No upper extremity supported Sitting balance-Leahy Scale: Good     Standing balance support: No upper extremity supported Standing balance-Leahy Scale: Fair                              Cognition Arousal/Alertness: Awake/alert Behavior During Therapy: WFL for tasks assessed/performed Overall Cognitive Status: Within Functional Limits for tasks assessed                                        Exercises Total Joint Exercises Ankle Circles/Pumps: AROM;Both;15 reps;Supine Quad Sets: AROM;Both;Supine;15 reps Heel Slides: AAROM;Left;Supine;20 reps Straight Leg Raises: AAROM;AROM;Left;Supine;20 reps    General Comments        Pertinent Vitals/Pain Pain Assessment: 0-10 Pain Score: 6  Pain Location: L knee Pain Descriptors / Indicators: Aching;Sore Pain Intervention(s): Limited activity within patient's tolerance;Monitored during session;Premedicated before session;Ice applied    Home Living                      Prior Function            PT Goals (current goals can now be found in the care plan section) Acute Rehab PT Goals Patient Stated Goal: Regain IND PT Goal Formulation: With patient Time For Goal Achievement: 08/29/16 Potential to Achieve Goals: Good Progress towards PT goals: Progressing toward goals    Frequency    7X/week      PT Plan Current plan  remains appropriate    Co-evaluation              AM-PAC PT "6 Clicks" Daily Activity  Outcome Measure  Difficulty turning over in bed (including adjusting bedclothes, sheets and blankets)?: A Little Difficulty moving from lying on back to sitting on the side of the bed? : A Little Difficulty sitting down on and standing up from a chair with arms (e.g., wheelchair, bedside commode, etc,.)?: A Little Help needed moving to and from a  bed to chair (including a wheelchair)?: A Little Help needed walking in hospital room?: A Little Help needed climbing 3-5 steps with a railing? : A Little 6 Click Score: 18    End of Session Equipment Utilized During Treatment: Gait belt Activity Tolerance: Patient tolerated treatment well Patient left: in chair;with call bell/phone within reach;with family/visitor present Nurse Communication: Mobility status PT Visit Diagnosis: Difficulty in walking, not elsewhere classified (R26.2)     Time: 1047-1110 PT Time Calculation (min) (ACUTE ONLY): 23 min  Charges:  $Gait Training: 8-22 mins $Therapeutic Exercise: 8-22 mins $Therapeutic Activity: 8-22 mins                    G Codes:       Pg [phone]    Durk Carmen 08/27/2016, 12:32 PM

## 2016-08-27 NOTE — Progress Notes (Signed)
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   Follow Up Recommendations  DC plan and follow up therapy as arranged by surgeon     Equipment Recommendations  None recommended by PT    Recommendations for Other Services OT consult     Precautions / Restrictions Precautions Precautions: Knee;Fall Precaution Booklet Issued: No Restrictions Weight Bearing Restrictions: No LLE Weight Bearing: Weight bearing as tolerated    Mobility  Bed Mobility Overal bed mobility: Needs Assistance Bed Mobility: Supine to Sit     Supine to sit: Supervision;HOB elevated     General bed mobility comments: Supervision for safety; no physical assist required. Increased time needed  Transfers Overall transfer level: Needs assistance Equipment used: Rolling walker (2 wheeled) Transfers: Sit to/from Stand Sit to Stand: Supervision         General transfer comment: Supervision for safety with increased time required. Cues for hand placement and technique  Ambulation/Gait Ambulation/Gait assistance: Min guard;Supervision Ambulation Distance (Feet): 120 Feet (and 15' into bathroom) Assistive device: Rolling walker (2 wheeled) Gait Pattern/deviations: Step-to pattern;Decreased step length - right;Shuffle;Trunk flexed Gait velocity: decr Gait velocity interpretation: Below normal speed for age/gender General Gait Details: cues for posture and position from RW   Stairs Stairs: Yes   Stair Management: No rails;Step to pattern;Backwards;Forwards;With walker Number of Stairs: 2 General stair comments: single step twice with RW, once fwd and once bkwd.  Cues for sequence and foot/RW placement  Wheelchair Mobility    Modified Rankin (Stroke Patients  Only)       Balance Overall balance assessment: Needs assistance Sitting-balance support: Feet supported;No upper extremity supported Sitting balance-Leahy Scale: Good     Standing balance support: No upper extremity supported Standing balance-Leahy Scale: Fair                              Cognition Arousal/Alertness: Awake/alert Behavior During Therapy: WFL for tasks assessed/performed Overall Cognitive Status: Within Functional Limits for tasks assessed                                        Exercises      General Comments        Pertinent Vitals/Pain Pain Assessment: 0-10 Pain Score: 8  Pain Location: L knee with stair climbing Pain Descriptors / Indicators: Aching;Sore Pain Intervention(s): Limited activity within patient's tolerance;Monitored during session;Premedicated before session;Ice applied    Home Living                      Prior Function            PT Goals (current goals can now be found in the care plan section) Acute Rehab PT Goals Patient Stated Goal: Regain IND PT Goal Formulation: With patient Time For Goal Achievement: 08/29/16 Potential to Achieve Goals: Good Progress towards PT goals: Progressing toward goals    Frequency    7X/week      PT Plan Current plan remains appropriate    Co-evaluation  AM-PAC PT "6 Clicks" Daily Activity  Outcome Measure  Difficulty turning over in bed (including adjusting bedclothes, sheets and blankets)?: A Little Difficulty moving from lying on back to sitting on the side of the bed? : A Little Difficulty sitting down on and standing up from a chair with arms (e.g., wheelchair, bedside commode, etc,.)?: A Little Help needed moving to and from a bed to chair (including a wheelchair)?: A Little Help needed walking in hospital room?: A Little Help needed climbing 3-5 steps with a railing? : A Little 6 Click Score: 18    End of Session  Equipment Utilized During Treatment: Gait belt Activity Tolerance: Patient tolerated treatment well Patient left: with call bell/phone within reach;with family/visitor present;in bed Nurse Communication: Mobility status PT Visit Diagnosis: Difficulty in walking, not elsewhere classified (R26.2)     Time: 1610-9604 PT Time Calculation (min) (ACUTE ONLY): 26 min  Charges:  $Gait Training: 8-22 mins $Therapeutic Activity: 8-22 mins                    G Codes:       [id number] [phone]    [place] 08/27/2016, 12:23 PM

## 2016-08-27 NOTE — Discharge Instructions (Signed)

## 2016-08-31 NOTE — Discharge Summary (Signed)
Physician Discharge Summary  Patient ID: Connie Haynes MRN: [id number] DOB/AGE: Jun 11, 1955 61 y.o.  Admit date: 08/25/2016 Discharge date: 08/27/2016   Procedures:  Procedure(s) (LRB): LEFT TOTAL KNEE ARTHROPLASTY (Left)  Attending Physician:  Dr. Paralee Cancel   Admission Diagnoses:   Left knee primary OA / pain  Discharge Diagnoses:  Principal Problem:   S/P left TKA Active Problems:   Morbid obesity ([place])   S/P total knee replacement  Past Medical History:  Diagnosis Date  . Anemia    denies  . Anxiety   . Basal cell carcinoma    eye lid  . Cough    hx. dry cough chronic  . Disorder of tear duct system    frequent tearing of eyes  . GERD (gastroesophageal reflux disease)   . H/O: whooping cough 2000  . History of kidney infection 2015   ? sepsis  . History of kidney stones   . History of kidney stones   . Hyperlipidemia   . Hypertension   . IBS (irritable bowel syndrome)    controlled with med  . Osteoarthritis    arthritis -joints-limited ROM shoulders more on right  . PONV (postoperative nausea and vomiting)     per pt hx of low BP following shoulder surgery in 05-2015; was managed in hospital with IV fluids and d/c BP being taken a tthe time; pt was dced and F/U with endcrinoopgist who dx with adrenal insurffiency and started steroid therapy; treatmetnt with positive outome, now managed chronically on hydrocortisone tablets;  Marland Kitchen Sleep apnea    no longer using cpap  . 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 UTI (lower urinary tract infection)   . Wolff-Parkinson-White (WPW) syndrome 1990s   had ablation done  and now managed on carvedilol     HPI:    Connie Haynes, 61 y.o. female, has a history of pain and functional disability in the left knee due to  arthritis and has failed non-surgical conservative treatments for greater than 12 weeks to include NSAID's and/or analgesics, corticosteriod injections and activity modification.  Onset of symptoms was gradual, starting  years ago with gradually worsening course since that time. The patient noted prior procedures on the knee to include  arthroscopy and menisectomy on the left knee(s).  Patient currently rates pain in the left knee(s) at 10 out of 10 with activity. Patient has worsening of pain with activity and weight bearing, pain that interferes with activities of daily living, pain with passive range of motion, crepitus and joint swelling.  Patient has evidence of periarticular osteophytes and joint space narrowing by imaging studies.  There is no active infection.   Risks, benefits and expectations were discussed with the patient.  Risks including but not limited to the risk of anesthesia, blood clots, nerve damage, blood vessel damage, failure of the prosthesis, infection and up to and including death.  Patient understand the risks, benefits and expectations and wishes to proceed with surgery.   PCP: [name], MD   Discharged Condition: good  Hospital Course:  Patient underwent the above stated procedure on 08/25/2016. Patient tolerated the procedure well and brought to the recovery room in good condition and subsequently to the floor.  POD #1 BP: 145/81 ; Pulse: 69 ;  Temp: 98.6 F (37 C) ; Resp: 16 Patient reports pain as moderate, pain controlled. Had a rough night because of pain.  No other events throughout the night. Dorsiflexion/plantar flexion intact, incision: dressing C/D/I, no cellulitis present and compartment soft.   LABS  Basename    HGB     10.9  HCT     33.2   POD #2  BP: 97/63 ; Pulse: 65 ; Temp: 97.6 F (36.4 C) ; Resp: 14 Patient reports pain as mild, pain controlled. No events throughout the night.  She slept better last night.  Ready to be discharged home.    Dorsiflexion/plantar flexion intact, incision: dressing C/D/I, no cellulitis present and compartment soft.   LABS  Basename    HGB     10.1  HCT     30.2    Discharge Exam: General appearance: alert, cooperative and no distress Extremities: Homans sign is negative, no sign of DVT, no edema, redness or tenderness in the calves or thighs and no ulcers, gangrene or trophic changes  Disposition: Home with follow up in 2 weeks   Follow-up Information    Paralee Cancel, MD. Schedule an appointment as soon as possible for a visit in 2 week(s).   Specialty:  Orthopedic Surgery Contact information: [address] [place] [address] [phone]           Discharge Instructions    Call MD / Call 911    Complete by:  As directed    If you experience chest pain or shortness of breath, CALL 911 and be transported to the hospital emergency room.  If you develope a fever above 101 F, pus (white drainage) or increased drainage or redness at the wound, or calf pain, call your surgeon's office.   Change dressing    Complete by:  As directed    Maintain surgical dressing until follow up in the clinic. If the edges start to pull up, may reinforce with tape. If the dressing is no longer working, may remove and cover with gauze and tape, but must keep the area dry and clean.  Call with any questions or concerns.   Constipation Prevention    Complete by:  As directed    Drink plenty of fluids.  Prune juice may be helpful.  You may use a stool softener, such as Colace (over the counter) 100 mg twice a day.  Use MiraLax (over the counter) for constipation as needed.   Diet - low sodium heart healthy    Complete by:  As directed    Discharge instructions    Complete by:  As directed    Maintain surgical dressing until follow up in the clinic. If the edges start to pull up, may reinforce with tape. If the dressing is no longer working, may remove and cover with gauze and tape, but  must keep the area dry and clean.  Follow up in 2 weeks at [facility]. Call with any questions or concerns.   Increase activity slowly as tolerated    Complete by:  As directed    Weight bearing as tolerated with assist device (walker, cane, etc) as directed, use it as long as suggested by your surgeon or therapist, typically at least 4-6 weeks.   TED hose    Complete by:  As directed    Use stockings (TED hose) for 2 weeks on both leg(s).  You may remove them at night for sleeping.      Allergies  as of 08/27/2016      Reactions   Codeine Nausea And Vomiting   Can take if takes phernergan   Hydrocodone Nausea Only   Robaxin [methocarbamol] Rash      Medication List    STOP taking these medications   oxycodone 5 MG capsule Commonly known as:  OXY-IR Replaced by:  oxyCODONE 5 MG immediate release tablet     TAKE these medications   acetaminophen 500 MG tablet Commonly known as:  TYLENOL Take 2 tablets (1,000 mg total) by mouth every 8 (eight) hours.   AMITIZA 24 MCG capsule Generic drug:  lubiprostone Take 24 mcg by mouth 2 (two) times daily.   aspirin 81 MG chewable tablet Chew 1 tablet (81 mg total) by mouth 2 (two) times daily. Take for 4 weeks.   carvedilol 25 MG tablet Commonly known as:  COREG Take 12.5 mg by mouth 2 (two) times daily.   celecoxib 200 MG capsule Commonly known as:  CELEBREX Take 200 mg by mouth 2 (two) times daily.   cholecalciferol 1000 units tablet Commonly known as:  VITAMIN D Take 1,000 Units by mouth daily.   citalopram 20 MG tablet Commonly known as:  CELEXA Take 20 mg by mouth at bedtime.   CRANBERRY PO Take 1 tablet by mouth daily.   docusate sodium 100 MG capsule Commonly known as:  COLACE Take 1 capsule (100 mg total) by mouth 2 (two) times daily.   esomeprazole 40 MG capsule Commonly known as:  NEXIUM Take 40 mg by mouth daily before breakfast.   ferrous sulfate 325 (65 FE) MG tablet Take 1 tablet (325 mg  total) by mouth 3 (three) times daily after meals.   furosemide 20 MG tablet Commonly known as:  LASIX Take two tablets (40 mg) daily as needed for swelling.   hydrocortisone 10 MG tablet Commonly known as:  CORTEF Take 5-10 mg by mouth 2 (two) times daily with a meal. Take one tablet in the AM and one-half tablet in the afternoon.   losartan-hydrochlorothiazide 100-12.5 MG tablet Commonly known as:  HYZAAR Take 1 tablet by mouth daily. IF LIGHTHEADEDNESS OR  DIZZINESS AND BLOOD PRESSURE RUNS SYSTOLIC 824 OR LESS CUT IN HALF   multivitamin tablet Take 1 tablet by mouth every morning.   oxyCODONE 5 MG immediate release tablet Commonly known as:  Oxy IR/ROXICODONE Take 1-3 tablets (5-15 mg total) by mouth every 4 (four) hours as needed for severe pain. Replaces:  oxycodone 5 MG capsule   polyethylene glycol packet Commonly known as:  MIRALAX / GLYCOLAX Take 17 g by mouth 2 (two) times daily.   pramipexole 1 MG tablet Commonly known as:  MIRAPEX Take 1.5 mg by mouth at bedtime.   promethazine 25 MG tablet Commonly known as:  PHENERGAN Take 1 tablet (25 mg total) by mouth every 6 (six) hours as needed for nausea or vomiting.   tiZANidine 4 MG tablet Commonly known as:  ZANAFLEX Take 1.5 tablets (6 mg total) by mouth every 8 (eight) hours as needed for muscle spasms.        Signed: West Pugh. Ryanne Morand   PA-C  08/31/2016, 9:53 AM

## 2016-09-07 ENCOUNTER — Other Ambulatory Visit (HOSPITAL_COMMUNITY): Payer: Self-pay | Admitting: Orthopedic Surgery

## 2016-09-07 ENCOUNTER — Ambulatory Visit (HOSPITAL_COMMUNITY)
Admission: RE | Admit: 2016-09-07 | Discharge: 2016-09-07 | Disposition: A | Discharge status: Home / Self Care | Payer: BC Managed Care – PPO | Source: Ambulatory Visit | Attending: Cardiovascular Disease | Admitting: Cardiovascular Disease

## 2016-09-07 DIAGNOSIS — M7989 Other specified soft tissue disorders: Secondary | ICD-10-CM | POA: Insufficient documentation

## 2016-09-07 DIAGNOSIS — E785 Hyperlipidemia, unspecified: Secondary | ICD-10-CM | POA: Diagnosis not present

## 2016-09-07 DIAGNOSIS — M79662 Pain in left lower leg: Secondary | ICD-10-CM

## 2016-09-07 DIAGNOSIS — M1712 Unilateral primary osteoarthritis, left knee: Secondary | ICD-10-CM | POA: Diagnosis not present

## 2016-09-07 DIAGNOSIS — I10 Essential (primary) hypertension: Secondary | ICD-10-CM | POA: Diagnosis not present

## 2017-05-05 DIAGNOSIS — R112 Nausea with vomiting, unspecified: Secondary | ICD-10-CM | POA: Insufficient documentation

## 2017-06-23 DIAGNOSIS — N182 Chronic kidney disease, stage 2 (mild): Secondary | ICD-10-CM | POA: Insufficient documentation

## 2018-12-12 ENCOUNTER — Other Ambulatory Visit: Payer: Self-pay | Admitting: Orthopedic Surgery

## 2018-12-12 DIAGNOSIS — G8929 Other chronic pain: Secondary | ICD-10-CM

## 2018-12-16 ENCOUNTER — Other Ambulatory Visit: Payer: Self-pay

## 2018-12-16 ENCOUNTER — Ambulatory Visit
Admission: RE | Admit: 2018-12-16 | Discharge: 2018-12-16 | Disposition: A | Discharge status: Home / Self Care | Payer: Medicare Other | Source: Ambulatory Visit | Attending: Orthopedic Surgery | Admitting: Orthopedic Surgery

## 2018-12-16 DIAGNOSIS — M25512 Pain in left shoulder: Secondary | ICD-10-CM

## 2018-12-16 DIAGNOSIS — G8929 Other chronic pain: Secondary | ICD-10-CM

## 2019-02-08 DIAGNOSIS — M542 Cervicalgia: Secondary | ICD-10-CM | POA: Insufficient documentation

## 2019-03-02 ENCOUNTER — Other Ambulatory Visit: Payer: BC Managed Care – PPO

## 2019-08-10 ENCOUNTER — Emergency Department (HOSPITAL_COMMUNITY): Payer: Medicare Other

## 2019-08-10 ENCOUNTER — Encounter (HOSPITAL_BASED_OUTPATIENT_CLINIC_OR_DEPARTMENT_OTHER): Payer: Self-pay

## 2019-08-10 ENCOUNTER — Inpatient Hospital Stay (HOSPITAL_BASED_OUTPATIENT_CLINIC_OR_DEPARTMENT_OTHER)
Admission: EM | Admit: 2019-08-10 | Discharge: 2019-08-24 | DRG: 098 | Disposition: A | Discharge status: Home / Self Care | Payer: Medicare Other | Attending: Internal Medicine | Admitting: Internal Medicine

## 2019-08-10 ENCOUNTER — Other Ambulatory Visit: Payer: Self-pay

## 2019-08-10 DIAGNOSIS — Y92230 Patient room in hospital as the place of occurrence of the external cause: Secondary | ICD-10-CM | POA: Diagnosis not present

## 2019-08-10 DIAGNOSIS — K219 Gastro-esophageal reflux disease without esophagitis: Secondary | ICD-10-CM | POA: Diagnosis present

## 2019-08-10 DIAGNOSIS — R200 Anesthesia of skin: Secondary | ICD-10-CM

## 2019-08-10 DIAGNOSIS — F419 Anxiety disorder, unspecified: Secondary | ICD-10-CM | POA: Diagnosis not present

## 2019-08-10 DIAGNOSIS — Z87442 Personal history of urinary calculi: Secondary | ICD-10-CM | POA: Diagnosis not present

## 2019-08-10 DIAGNOSIS — R339 Retention of urine, unspecified: Secondary | ICD-10-CM | POA: Diagnosis not present

## 2019-08-10 DIAGNOSIS — D631 Anemia in chronic kidney disease: Secondary | ICD-10-CM | POA: Diagnosis present

## 2019-08-10 DIAGNOSIS — E785 Hyperlipidemia, unspecified: Secondary | ICD-10-CM | POA: Diagnosis not present

## 2019-08-10 DIAGNOSIS — G2581 Restless legs syndrome: Secondary | ICD-10-CM | POA: Diagnosis present

## 2019-08-10 DIAGNOSIS — E876 Hypokalemia: Secondary | ICD-10-CM | POA: Diagnosis not present

## 2019-08-10 DIAGNOSIS — Z20822 Contact with and (suspected) exposure to covid-19: Secondary | ICD-10-CM | POA: Diagnosis not present

## 2019-08-10 DIAGNOSIS — G251 Drug-induced tremor: Secondary | ICD-10-CM | POA: Diagnosis not present

## 2019-08-10 DIAGNOSIS — Z6841 Body Mass Index (BMI) 40.0 and over, adult: Secondary | ICD-10-CM

## 2019-08-10 DIAGNOSIS — I129 Hypertensive chronic kidney disease with stage 1 through stage 4 chronic kidney disease, or unspecified chronic kidney disease: Secondary | ICD-10-CM | POA: Diagnosis present

## 2019-08-10 DIAGNOSIS — Z981 Arthrodesis status: Secondary | ICD-10-CM | POA: Diagnosis not present

## 2019-08-10 DIAGNOSIS — Z8249 Family history of ischemic heart disease and other diseases of the circulatory system: Secondary | ICD-10-CM

## 2019-08-10 DIAGNOSIS — Z85828 Personal history of other malignant neoplasm of skin: Secondary | ICD-10-CM

## 2019-08-10 DIAGNOSIS — G373 Acute transverse myelitis in demyelinating disease of central nervous system: Secondary | ICD-10-CM | POA: Diagnosis present

## 2019-08-10 DIAGNOSIS — Z79899 Other long term (current) drug therapy: Secondary | ICD-10-CM | POA: Diagnosis not present

## 2019-08-10 DIAGNOSIS — M5430 Sciatica, unspecified side: Secondary | ICD-10-CM | POA: Diagnosis present

## 2019-08-10 DIAGNOSIS — Z452 Encounter for adjustment and management of vascular access device: Secondary | ICD-10-CM

## 2019-08-10 DIAGNOSIS — R531 Weakness: Secondary | ICD-10-CM

## 2019-08-10 DIAGNOSIS — E271 Primary adrenocortical insufficiency: Secondary | ICD-10-CM | POA: Diagnosis not present

## 2019-08-10 DIAGNOSIS — T380X5A Adverse effect of glucocorticoids and synthetic analogues, initial encounter: Secondary | ICD-10-CM | POA: Diagnosis not present

## 2019-08-10 DIAGNOSIS — Z87828 Personal history of other (healed) physical injury and trauma: Secondary | ICD-10-CM

## 2019-08-10 DIAGNOSIS — Z885 Allergy status to narcotic agent status: Secondary | ICD-10-CM

## 2019-08-10 DIAGNOSIS — G4733 Obstructive sleep apnea (adult) (pediatric): Secondary | ICD-10-CM | POA: Diagnosis present

## 2019-08-10 DIAGNOSIS — Z87440 Personal history of urinary (tract) infections: Secondary | ICD-10-CM

## 2019-08-10 DIAGNOSIS — I456 Pre-excitation syndrome: Secondary | ICD-10-CM | POA: Diagnosis not present

## 2019-08-10 DIAGNOSIS — N183 Chronic kidney disease, stage 3 unspecified: Secondary | ICD-10-CM | POA: Diagnosis present

## 2019-08-10 DIAGNOSIS — G369 Acute disseminated demyelination, unspecified: Secondary | ICD-10-CM | POA: Diagnosis present

## 2019-08-10 DIAGNOSIS — Z888 Allergy status to other drugs, medicaments and biological substances status: Secondary | ICD-10-CM

## 2019-08-10 DIAGNOSIS — I10 Essential (primary) hypertension: Secondary | ICD-10-CM | POA: Diagnosis present

## 2019-08-10 HISTORY — DX: Unspecified adrenocortical insufficiency: E27.40

## 2019-08-10 HISTORY — DX: Chronic kidney disease, stage 3 unspecified: N18.30

## 2019-08-10 LAB — CBC WITH DIFFERENTIAL/PLATELET
Abs Immature Granulocytes: 0.03 10*3/uL (ref 0.00–0.07)
Basophils Absolute: 0.1 10*3/uL (ref 0.0–0.1)
Basophils Relative: 1 %
Eosinophils Absolute: 0.4 10*3/uL (ref 0.0–0.5)
Eosinophils Relative: 4 %
HCT: 37.9 % (ref 36.0–46.0)
Hemoglobin: 12.5 g/dL (ref 12.0–15.0)
Immature Granulocytes: 0 %
Lymphocytes Relative: 18 %
Lymphs Abs: 1.6 10*3/uL (ref 0.7–4.0)
MCH: 30.7 pg (ref 26.0–34.0)
MCHC: 33 g/dL (ref 30.0–36.0)
MCV: 93.1 fL (ref 80.0–100.0)
Monocytes Absolute: 0.7 10*3/uL (ref 0.1–1.0)
Monocytes Relative: 7 %
Neutro Abs: 6.1 10*3/uL (ref 1.7–7.7)
Neutrophils Relative %: 70 %
Platelets: 283 10*3/uL (ref 150–400)
RBC: 4.07 MIL/uL (ref 3.87–5.11)
RDW: 12.8 % (ref 11.5–15.5)
WBC: 8.9 10*3/uL (ref 4.0–10.5)
nRBC: 0 % (ref 0.0–0.2)

## 2019-08-10 LAB — BASIC METABOLIC PANEL
Anion gap: 10 (ref 5–15)
BUN: 19 mg/dL (ref 8–23)
CO2: 26 mmol/L (ref 22–32)
Calcium: 9.2 mg/dL (ref 8.9–10.3)
Chloride: 100 mmol/L (ref 98–111)
Creatinine, Ser: 0.99 mg/dL (ref 0.44–1.00)
GFR calc Af Amer: >60 mL/min (ref >60)
GFR calc non Af Amer: 59 mL/min — ABNORMAL LOW (ref >60)
Glucose, Bld: 107 mg/dL — ABNORMAL HIGH (ref 70–99)
Potassium: 3.4 mmol/L — ABNORMAL LOW (ref 3.5–5.1)
Sodium: 136 mmol/L (ref 135–145)

## 2019-08-10 LAB — URINALYSIS, ROUTINE W REFLEX MICROSCOPIC
Bilirubin Urine: NEGATIVE
Glucose, UA: NEGATIVE mg/dL
Hgb urine dipstick: NEGATIVE
Ketones, ur: NEGATIVE mg/dL
Leukocytes,Ua: NEGATIVE
Nitrite: NEGATIVE
Protein, ur: NEGATIVE mg/dL
Specific Gravity, Urine: 1.01 (ref 1.005–1.030)
pH: 5 (ref 5.0–8.0)

## 2019-08-10 LAB — SARS CORONAVIRUS 2 BY RT PCR (HOSPITAL ORDER, PERFORMED IN ~~LOC~~ HOSPITAL LAB): SARS Coronavirus 2: NEGATIVE

## 2019-08-10 MED ORDER — GADOBUTROL 1 MMOL/ML IV SOLN
10.0000 mL | Freq: Once | INTRAVENOUS | Status: AC | PRN
  Administered 2019-08-10: 10 mL via INTRAVENOUS

## 2019-08-10 MED ORDER — LORAZEPAM 2 MG/ML IJ SOLN
1.0000 mg | Freq: Once | INTRAMUSCULAR | Status: AC
  Administered 2019-08-10: 1 mg via INTRAVENOUS
  Filled 2019-08-10: qty 1

## 2019-08-10 MED ORDER — LIDOCAINE HCL (PF) 1 % IJ SOLN
INTRAMUSCULAR | Status: AC
  Filled 2019-08-10: qty 5

## 2019-08-10 NOTE — ED Notes (Signed)
RN called radiology in regards to LP. Pt is next on list to go to radiology.

## 2019-08-10 NOTE — ED Notes (Signed)
When attempting to perform plantar and dorsal flexion, pt states she cannot push down, negative Babinski reflex noted. Denies pain in legs. "I cant feel my feet"

## 2019-08-10 NOTE — ED Notes (Signed)
Patient transported to MRI 

## 2019-08-10 NOTE — ED Notes (Signed)
Pt arrived to ED from [facility]. A&Ox4, NAD, VSS, waiting for MRI.

## 2019-08-10 NOTE — ED Notes (Signed)
Attempted to urinate but not able.

## 2019-08-10 NOTE — ED Notes (Signed)
Pt ambulated to restroom with tech assistance. Post void residual <10 mL.

## 2019-08-10 NOTE — ED Notes (Signed)
ED Provider at bedside. 

## 2019-08-10 NOTE — ED Notes (Signed)
Patient transported to X-ray 

## 2019-08-10 NOTE — ED Provider Notes (Signed)
5:30 PM  Pt transferred from [facility] for MRI of her lower spine.  Has chronic history of lower back pain but is now having numbness in both legs and recently seen by multiple outpatient providers for urinary retention.  MRI lumbar spine ordered but have also added on MRI of the thoracic spine.  No fevers, epidural injections, IV drug abuse.  Has had previous history of cervical spine surgery in 2009 or 2010 by Dr. Elisabeth Pigeon with [facility] who has reportedly retired.  Neurologist is Dr. Everette Rank.   7:05 PM  D/w radiologist. He reports patient has abnormal cord signal from T3-4 op and that she needs cervical spine MRI with and without contrast. Updated patient and husband.  10:00 PM  Pt's cervical spine MRI is concerning for transverse myelitis.  Discussed with Dr. Rory Percy who has seen the patient and recommends lumbar puncture.  Will admit to medicine.  10:10 PM  Pt has been counseled regarding risk and benefits of lumbar puncture.  She would prefer to have this performed under fluoroscopy given concerns for difficulty due to body habitus.  Discussed with Dr. Ardeen Garland with radiology.  He states that he will attempt to get lumbar puncture done tonight so that we can determine if patient needs antibiotics versus steroids.  Will discuss with medicine for admission.  10:43 PM Discussed patient's case with hospitalist, Dr. Josephine Cables.  I have recommended admission and patient (and family if present) agree with this plan. Admitting physician will place admission orders.   I reviewed all nursing notes, vitals, pertinent previous records and reviewed/interpreted all EKGs, lab and urine results, imaging (as available).    Per [facility] Records:  MRI C spine 02/11/2019:  CLINICAL DATA: Neck pain. Left arm pain. Previous cervical fusion.   EXAM:  MRI CERVICAL SPINE WITHOUT CONTRAST   TECHNIQUE:  Multiplanar, multisequence MR imaging of the cervical spine was  performed. No intravenous contrast was administered.    COMPARISON: 10/09/2017   FINDINGS:  Alignment: 1 mm degenerative anterolisthesis C3-4 and C7-T1.   Vertebrae: Previous ACDF C5 through C7. insignificant hemangioma  within the T1 vertebral body on the right side.   Cord: No cord compression or primary cord lesion.   Posterior Fossa, vertebral arteries, paraspinal tissues: Negative   Disc levels:   Foramen magnum is widely patent. Ordinary mild osteoarthritis at the  C1-2 articulation but no encroachment upon the neural spaces.   C2-3: No disc pathology. Mild facet osteoarthritis. No compressive  stenosis.   C3-4: Right-sided facet osteoarthritis with 1 mm of anterolisthesis.  Mild bulging of the disc. Mild foraminal narrowing without definite  compressive stenosis.   C4-5: Mild bulging of the disc. Mild bilateral facet arthritis. Mild  bilateral foraminal narrowing but without definite compressive  stenosis.   C5 through C7: Previous ACDF. Apparent solid fusion wide patency of  the canal. Mild bony foraminal narrowing at C6-7 but without likely  neural compression.   C7-T1: Facet osteoarthritis with 1 mm of anterolisthesis. Mild  bulging of the disc. No canal or foraminal stenosis.   No significant changes appreciated when compared to the study of  2019.   IMPRESSION:  No significant changewhen compared to the study of 10/09/2017.  Previous ACDF from C5 through C7 appears solid with wide patency of  the canal. Mild chronic foraminal narrowing at C6-7.   Facet osteoarthritis and disc bulges at the other levels as outlined  above. 1 mm of degenerative anterolisthesis at C3-4 and C7-T1  because of the  facet arthritis. There is no canal stenosis. Mild  bilateral foraminal stenosis at C3-4 and C4-5 but no distinct neural  compression.    Electronically Signed  By: Nelson Chimes M.D.  On: 02/11/2019 17:49   MRI L spine 11/14/2017:  MRI LUMBAR SPINE WITHOUT CONTRAST, 11/13/2017 1:50 PM    INDICATION: leg  weakness \ R29.898 Weakness of both lower extremities   COMPARISON: 08/15/2015    TECHNIQUE: Multiplanar, multi-sequence surface-coil MR imaging of the lumbar spine was performedwithout contrast.    LEVELS IMAGED: T11 to upper sacral region.    FINDINGS:   Alignment: Anterolisthesis of L2 On L3 with narrowing of the disc space and posterior disc osteophyte combination which narrows the central canal.  Vertebrae: No marrow signal abnormalities to suggest neoplasm.  Conus: In normal position without imaging evidence for tethering. Normal signal and contour.    T12-L1: No significant focal abnormality.  L1-L2: No significant focal abnormality.  L2-L3: Degenerative disc disease and degenerative bony disease with a disc protrusion in the right lateral recess which could cause dynamic irritation of the exiting right L2 nerve root.  L3-L4: Degenerative disc disease and facet joint arthropathy.  L4-L5: Degenerative disc disease and facet joint arthropathy with a disc protrusion on the left which narrows the left neural foramen and could cause dynamic irritation of the exiting left L4 nerve root.  L5-S1: No significant focal abnormality.  Upper sacrum/ilium: No significant focal abnormality. Procedure Note    CONCLUSION:    Abnormal noncontrast MRI of the lumbar spine.  Disc protrusion and osteophyte combination at L2-L3 with narrowing of the right lateral recess and dynamic irritation of the exiting right L2 nerve root. Anterolisthesis of L2 on L3.  Disc protrusion on the left at L4-L5 with possible dynamic irritation of the exiting left L4 nerve root.  Significant changesince prior scan.     CRITICAL CARE Performed by: Pryor Curia   Total critical care time: 45 minutes  Critical care time was exclusive of separately billable procedures and treating other patients.  Critical care was necessary to treat or prevent imminent or life-threatening deterioration.  Critical care  was time spent personally by me on the following activities: development of treatment plan with patient and/or surrogate as well as nursing, discussions with consultants, evaluation of patient's response to treatment, examination of patient, obtaining history from patient or surrogate, ordering and performing treatments and interventions, ordering and review of laboratory studies, ordering and review of radiographic studies, pulse oximetry and re-evaluation of patient's condition.    Shalina Norfolk, Delice Bison, DO 08/10/19 [id number]

## 2019-08-10 NOTE — ED Triage Notes (Signed)
Pt c/o numbness and "no feeling" from groin/waist to bilat LE x 1 week-c/o urinary retention started 8 days ago-seen by PCP and had f/u phone visit with neuro yesterday-was advised to come to ED-to triage in w/c-states she walked in door-NAD

## 2019-08-10 NOTE — ED Notes (Signed)
States last wed has difficulty with urinating, awoke this next am had numbness in her legs and feet. Went to urgent care and was sent to [facility] ED, blood work and CT scan was done. States she spoke with nephrologist last night and was told to go to the ED for an MRI.

## 2019-08-10 NOTE — Consult Note (Signed)
Neurology Consultation  Reason for Consult: Urinary retention, lower extremity numbness and weakness Referring Physician: Dr. Cyril Mourning Ward, EDP  CC: Urinary retention, lower extremity numbness and weakness that has been progressive over the last week  History is obtained from: Patient, husband, chart  HPI: Connie Haynes is a 64 y.o. 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 She says about a week ago, she started noticing that she is unable to empty her bladder.  She thought she is having a UTI-gets frequent UTIs.  The next morning, she lost feeling in sensation in both her feet and up to the calves.  This progressed into weakness of both lower extremities making it difficult to walk.  She also sustained a fall.  The symptoms got progressively worse and made her come to the emergency room today. She was evaluated at the freestanding ER in [facility], sent to [facility] for imaging of her lumbar spine due to concern for possible cauda equina syndrome.  A thoracic MRI was also added on due to her sensory symptoms and leg weakness.  Multiple abnormalities were noted on the thoracic MRI and a cervical spine MRI was also done.  There is a longitudinally extensive spinal cord lesion extending all the way from C4-T4 with patchy postcontrast enhancement suggestive of transverse colitis.  A neurological consultation was obtained for the same. She denies having had the symptoms before.  Denies having had a cerebral stroke but says that she had neck surgery done and was told that she had a small spinal cord infarct on the table-that had left her with some left-sided weakness for which she  required therapy and had recovered completely.  That was 10 years ago. Had no baseline urinary retention, frequency or hesitancy but had a tendency to get frequent UTIs. Also is on chronic hydrocortisone for adrenal insufficiency.   ROS: Performed and negative except as noted in HPI.  Past Medical History:  Diagnosis Date  . Adrenal insufficiency ([place])   . Anemia    denies  . Anxiety   . Basal cell carcinoma    eye lid  . Chronic kidney disease, stage 3   . Cough    hx. dry cough chronic  . Disorder of tear duct system    frequent tearing of eyes  . GERD (gastroesophageal reflux disease)   . H/O: whooping cough 2000  . History of kidney infection 2015   ? sepsis  . History of kidney stones   . History of kidney stones   . Hyperlipidemia   . Hypertension   . IBS (irritable bowel syndrome)    controlled with med  . Osteoarthritis    arthritis -joints-limited ROM shoulders more on right  . PONV (postoperative nausea and vomiting)     per pt hx of low BP following shoulder surgery in 05-2015; was managed in hospital with IV fluids and d/c BP being taken a tthe time; pt was dced and F/U with endcrinoopgist who dx with adrenal insurffiency and started steroid therapy; treatmetnt with positive outome, now managed chronically on hydrocortisone tablets;  Marland Kitchen Sleep apnea    no longer using cpap  . Stroke [facility])    per patient "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  that was because of 3 compressed vertebrae  that were causing LLE tremors and weakneess; underwent physcal thereapy with return to normal ADLs with exception of occasional tremors in right hand   . UTI (lower urinary tract infection)   . Wolff-Parkinson-White (WPW) syndrome 1990s   had ablation done  and now managed on carvedilol     Family History  Problem Relation Age of Onset  . Leukemia Brother   . Heart disease Mother   . Hypertension Mother   . Hyperlipidemia Mother   . Heart attack Mother   . Heart failure Mother   . Heart  disease Maternal Grandfather   . Heart attack Maternal Grandfather   . Cancer Paternal Grandfather   . Lung cancer Paternal Grandfather   . Cancer Maternal Uncle        bone   Social History:   reports that she has never smoked. She has never used smokeless tobacco. She reports that she does not drink alcohol and does not use drugs.  Medications  Current Facility-Administered Medications:  .  lidocaine (PF) (XYLOCAINE) 1 % injection, , , ,   Current Outpatient Medications:  .  acetaminophen (TYLENOL) 500 MG tablet, Take 2 tablets (1,000 mg total) by mouth every 8 (eight) hours., Disp: 30 tablet, Rfl: 0 .  AMITIZA 24 MCG capsule, Take 24 mcg by mouth 2 (two) times daily. , Disp: , Rfl:  .  carvedilol (COREG) 25 MG tablet, Take 12.5 mg by mouth 2 (two) times daily., Disp: , Rfl:  .  celecoxib (CELEBREX) 200 MG capsule, Take 200 mg by mouth 2 (two) times daily., Disp: , Rfl:  .  cholecalciferol (VITAMIN D) 1000 UNITS tablet, Take 1,000 Units by mouth daily., Disp: , Rfl:  .  citalopram (CELEXA) 20 MG tablet, Take 20 mg by mouth at bedtime., Disp: , Rfl:  .  CRANBERRY PO, Take 1 tablet by mouth daily., Disp: , Rfl:  .  docusate sodium (COLACE) 100 MG capsule, Take 1 capsule (100 mg total) by mouth 2 (two) times daily., Disp: 10 capsule, Rfl: 0 .  esomeprazole (NEXIUM) 40 MG capsule, Take 40 mg by mouth daily before breakfast., Disp: , Rfl:  .  ferrous sulfate 325 (65 FE) MG tablet, Take 1 tablet (325 mg total) by mouth 3 (three) times daily after meals., Disp: , Rfl: 3 .  furosemide (LASIX) 20 MG tablet, Take two tablets (40 mg) daily as needed for swelling., Disp: , Rfl:  .  hydrocortisone (CORTEF) 10 MG tablet, Take 5-10 mg by mouth 2 (two) times daily with a meal. Take one tablet in the AM and one-half tablet in the afternoon., Disp: , Rfl:  .  losartan-hydrochlorothiazide (HYZAAR) 100-12.5 MG tablet, Take 1 tablet by mouth daily. IF LIGHTHEADEDNESS OR  DIZZINESS AND BLOOD PRESSURE RUNS  SYSTOLIC 956 OR LESS CUT IN HALF, Disp: 90 tablet, Rfl: 5 .  Multiple Vitamin (MULTIVITAMIN) tablet, Take 1 tablet by mouth every morning. , Disp: , Rfl:  .  oxyCODONE (OXY IR/ROXICODONE) 5 MG immediate release tablet, Take 1-3 tablets (5-15 mg total) by mouth every 4 (four) hours as needed for severe pain., Disp: 90 tablet, Rfl: 0 .  polyethylene glycol (MIRALAX / GLYCOLAX) packet, Take 17 g by mouth 2 (two) times daily., Disp: 14 each, Rfl: 0 .  pramipexole (MIRAPEX) 1 MG tablet, Take 1.5 mg by mouth at bedtime. , Disp: , Rfl:  .  promethazine (PHENERGAN) 25 MG tablet, Take 1 tablet (25 mg total) by mouth  every 6 (six) hours as needed for nausea or vomiting., Disp: 60 tablet, Rfl: 0 .  tiZANidine (ZANAFLEX) 4 MG tablet, Take 1.5 tablets (6 mg total) by mouth every 8 (eight) hours as needed for muscle spasms., Disp: 30 tablet, Rfl: 0  Facility-Administered Medications Ordered in Other Encounters:  .  chlorhexidine (HIBICLENS) 4 % liquid 4 application, 60 mL, Topical, Once, Dixon, Robb Matar, PA-C .  technetium tetrofosmin (TC-MYOVIEW) injection 31.7 milli Curie, 51.8 millicurie, Intravenous, Once PRN, Dorothy Spark, MD  Exam: Current vital signs: BP 136/67 (BP Location: Left Arm)   Pulse 72   Temp 98.2 F (36.8 C) (Oral)   Resp 16   SpO2 99%  Vital signs in last 24 hours: Temp:  [98.2 F (36.8 C)-99.4 F (37.4 C)] 98.2 F (36.8 C) (07/01 1702) Pulse Rate:  [68-85] 72 (07/01 2214) Resp:  [14-20] 16 (07/01 2214) BP: (136-168)/(67-73) 136/67 (07/01 2214) SpO2:  [97 %-99 %] 99 % (07/01 2214) General: Awake alert in no distress.  Obese HEENT: Normocephalic atraumatic CVs: Regular rate rhythm Respiratory: Breathing well and saturating normally on room air Extremities: Warm well perfused with trace edema bilaterally NEURO:  AAOx3 No dysarthria No aphasia CN: PERRL, EOMI, VFF, face symmetric, shoulder shrug intact. Tongue midline. Motor:4/5 b/l UE. 3/5 at hip flexors b/l,  3-4/5 knee flexors, 2/5 plantar and dorsiflexors b/l. Diminshed tone. Sensory: level at T12-L1 with some diminished sensation in b/l axillae. DTR: can not elicit any DTRs. Equivocal toes b/l No dysmetria on finger-nose-finger testing.   Labs I have reviewed labs in epic and the results pertinent to this consultation are:  CBC    Component Value Date/Time   WBC 8.9 08/10/2019 1543   RBC 4.07 08/10/2019 1543   HGB 12.5 08/10/2019 1543   HCT 37.9 08/10/2019 1543   PLT 283 08/10/2019 1543   MCV 93.1 08/10/2019 1543   MCH 30.7 08/10/2019 1543   MCHC 33.0 08/10/2019 1543   RDW 12.8 08/10/2019 1543   LYMPHSABS 1.6 08/10/2019 1543   MONOABS 0.7 08/10/2019 1543   EOSABS 0.4 08/10/2019 1543   BASOSABS 0.1 08/10/2019 1543    CMP     Component Value Date/Time   NA 136 08/10/2019 1543   K 3.4 (L) 08/10/2019 1543   CL 100 08/10/2019 1543   CO2 26 08/10/2019 1543   GLUCOSE 107 (H) 08/10/2019 1543   BUN 19 08/10/2019 1543   CREATININE 0.99 08/10/2019 1543   CREATININE 0.87 06/18/2015 1207   CALCIUM 9.2 08/10/2019 1543   PROT 7.2 12/17/2013 1212   ALBUMIN 4.2 12/17/2013 1212   AST 18 12/17/2013 1212   ALT 19 12/17/2013 1212   ALKPHOS 86 12/17/2013 1212   BILITOT 0.5 12/17/2013 1212   GFRNONAA 59 (L) 08/10/2019 1543   GFRAA >60 08/10/2019 1543     Imaging I have reviewed the images obtained: MRI of the cervical, thoracic and lumbar spine IMPRESSION: 1. Abnormal T2/STIR signal intensity throughout the mid and lower cervical spinal cord, extending from C4 through T3-4, with associated patchy post-contrast enhancement. 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 2. Prior ACDF at C5-C7 without residual spinal stenosis. 3. Mild degenerative spondylosis and facet degeneration at C3-4 and C4-5 with resultant moderate bilateral C4 and C5 foraminal stenosis.  IMPRESSION: Motion artifact is  present.  Abnormal cord signal extending superiorly from the T3-T4 level into the cervical spine. Recommend contrast enhanced MRI of the cervical spine.  Multilevel degenerative  changes as detailed above. No high-grade canal stenosis. Right foraminal protrusion at L3-L4 causes moderate stenosis.  Assessment:  64 year old with above past medical history presenting for about a week along with trouble with urinary retention and subsequent lower extremity numbness as well as weakness. On examination, she has significant lower extremity weakness worse distally, and almost symmetric.  Due to body habitus, DTRs could not be elicited.  She also has a sensory level at almost T12 level. She also has some weakness in her upper extremities bilaterally. The whole spine imaging-cervical thoracic and lumbar suggestive of abnormal cord signal from C4-T4 level-with differentials including inflammatory processes, autoimmune etiology, transverse myelitis or infectious pathology.. Given her extensive history of chronic back pain and neck pain and neck surgeries, lower on the differential should also be fibrocartilaginous embolism causing spinal cord infarct.  Recommendations: #I would recommend obtaining a spinal tap. Tests to be ordered: -cell count, glucose, protein, Gram stain, IgG index, oligoclonal bands. #If the CSF evaluation reveals no evidence of infection, I would recommend treating with high-dose steroids-IV Solu-Medrol 1 g for 5 days. #I would hold off on starting steroids until the CSF has been obtained and evaluated. #Due to the patient's body habitus, she will get a fluoroscopy guided spinal tap.  Discussed the possibility of a bedside tap but the patient wishes to only proceed with a guided tap.  Discussion was also had with the ER provider. #I will also send out neuromyelitis optica antibodies.  I would also check serum angiotensin-converting enzyme levels. #Other tests that I would recommend  include: ANA, rheumatoid factor, SSA, SSB, double-stranded DNA, B12, TSH.  We will follow. Plan d/w Dr. Leonides Schanz  -- Amie Portland, MD [facility] Pager: [phone] If 7pm to 7am, please call on call as listed on AMION.

## 2019-08-10 NOTE — ED Provider Notes (Signed)
[facility] EMERGENCY DEPARTMENT Provider Note   CSN: [id number] Arrival date & time: 08/10/19  1342     History Chief Complaint  Patient presents with  . Numbness  . Urinary Retention    Connie Haynes is a 64 y.o. female.  64yo F w/ PMH below including CKD, HTN, HLD, morbid obesity, WPW, low back problems who p/w numbness and urinary problems. PT has long hx of back problems including sciatica and spinal stenosis. She follows w/ a neurologist and has regular physical therapy to work with her leg strength. She reports that over the past week, she has developed numbness to her lower legs, feet, and groin b/l, sparing her thighs. She reports not being able to urinate well, sometimes dribbling instead of having strong stream. She reports she is able to have BMs but when she sits on the toilet and has a BM, she cannot tell that she has done it. She denies any incontinence. No fevers or recent illness. She reports difficulty walking due to the numbness, causing her to have to hold onto things in order to walk. No headache, vision changes, or upper extremity symptoms.   The history is provided by the patient.       Past Medical History:  Diagnosis Date  . Adrenal insufficiency ([place])   . Anemia    denies  . Anxiety   . Basal cell carcinoma    eye lid  . Chronic kidney disease, stage 3   . Cough    hx. dry cough chronic  . Disorder of tear duct system    frequent tearing of eyes  . GERD (gastroesophageal reflux disease)   . H/O: whooping cough 2000  . History of kidney infection 2015   ? sepsis  . History of kidney stones   . History of kidney stones   . Hyperlipidemia   . Hypertension   . IBS (irritable bowel syndrome)    controlled with med  . Osteoarthritis    arthritis -joints-limited ROM shoulders more on right  . PONV (postoperative nausea and vomiting)     per pt hx of low BP following shoulder surgery in 05-2015; was managed in hospital with IV fluids and d/c  BP being taken a tthe time; pt was dced and F/U with endcrinoopgist who dx with adrenal insurffiency and started steroid therapy; treatmetnt with positive outome, now managed chronically on hydrocortisone tablets;  Marland Kitchen Sleep apnea    no longer using cpap  . 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 UTI (lower urinary tract infection)   . Wolff-Parkinson-White (WPW) syndrome 1990s   had ablation done  and now managed on carvedilol     Patient Active Problem List   Diagnosis Date Noted  . S/P left TKA 08/25/2016  . S/P total knee replacement 08/25/2016  . S/P shoulder replacement 06/29/2014  . OSA (obstructive sleep apnea) 01/22/2014  . Left ureteral stone 12/18/2013  . UTI (urinary tract infection) 12/18/2013  . Morbid obesity ([place]) 08/09/2013  . S/P right TKA 08/08/2013  . Chronic pain syndrome 03/04/2013  . Depressive disorder, not elsewhere classified 03/04/2013  . Need for prophylactic vaccination and inoculation against influenza 10/04/2012  . Erosive osteoarthritis of multiple sites 10/04/2012  . Chronic cough 10/04/2012  . Swelling  of limb 08/21/2012  . Essential hypertension, benign 08/21/2012  . Preoperative general physical examination 06/12/2012  . Restless leg syndrome 06/12/2012  . Anxiety state, unspecified 06/12/2012  . Lung nodule 07/16/2011  . Cough 04/21/2011  . Wolff-Parkinson-White (WPW) syndrome   . Osteoarthritis   . Hyperlipidemia   . IBS (irritable bowel syndrome)   . Kidney stones     Past Surgical History:  Procedure Laterality Date  . ablation surgery     1993 [facility] (Dr. Hulen Skains)  . APPENDECTOMY    . CARDIAC ELECTROPHYSIOLOGY STUDY AND ABLATION     '93 for WPW syndrome  . CARPAL TUNNEL RELEASE Right 12/21/2014   Procedure: RIGHT CARPAL  TUNNEL RELEASE ;  Surgeon: Netta Cedars, MD;  Location: [facility];  Service: Orthopedics;  Laterality: Right;  . CARPAL TUNNEL RELEASE Left   . CERVICAL SPINE SURGERY     cervical fusion with hardware retained  . COLON SURGERY     "colon detached from abdomen"  . COLONOSCOPY WITH PROPOFOL N/A 05/10/2014   Procedure: COLONOSCOPY WITH PROPOFOL;  Surgeon: Juanita Craver, MD;  Location: [facility];  Service: Endoscopy;  Laterality: N/A;  . CYSTOSCOPY/RETROGRADE/URETEROSCOPY Left 12/18/2013   Procedure: CYSTOSCOPY/RETROGRADE/LEFT STENT;  Surgeon: Malka So, MD;  Location: [facility];  Service: Urology;  Laterality: Left;  . ESOPHAGOGASTRODUODENOSCOPY (EGD) WITH PROPOFOL N/A 05/10/2014   Procedure: ESOPHAGOGASTRODUODENOSCOPY (EGD) WITH PROPOFOL;  Surgeon: Juanita Craver, MD;  Location: [facility];  Service: Endoscopy;  Laterality: N/A;  . EYELID CARCINOMA EXCISION    . JOINT REPLACEMENT    . KNEE ARTHROSCOPY Left 07/16/2016  . REVERSE SHOULDER ARTHROPLASTY Right 06/29/2014   Procedure: REVERSE TOTAL SHOULDER ARTHROPLASTY;  Surgeon: Netta Cedars, MD;  Location: [facility];  Service: Orthopedics;  Laterality: Right;  . REVISION TOTAL SHOULDER TO REVERSE TOTAL SHOULDER Left 05/15/2015  . REVISION TOTAL SHOULDER TO REVERSE TOTAL SHOULDER Left 05/15/2015   Procedure: LEFT SHOULDER REVISION TOTAL SHOULDER ARTHRPLASTY TO REVERSE TOTAL SHOULDER ARTHROPLASTY ;  Surgeon: Netta Cedars, MD;  Location: [facility];  Service: Orthopedics;  Laterality: Left;  . tear duct surgery    . TOTAL ABDOMINAL HYSTERECTOMY    . TOTAL HIP ARTHROPLASTY     x 3(rt x2, lt x1)  . TOTAL KNEE ARTHROPLASTY Right 08/08/2013   Procedure: RIGHT TOTAL KNEE ARTHROPLASTY WITH SAPHENOUS NERVE SCAR EXCISION;  Surgeon: Mauri Pole, MD;  Location: [facility];  Service: Orthopedics;  Laterality: Right;  . TOTAL KNEE ARTHROPLASTY Left 08/25/2016   Procedure: LEFT TOTAL KNEE ARTHROPLASTY;  Surgeon: Paralee Cancel, MD;  Location: [facility];  Service: Orthopedics;   Laterality: Left;  70 mins; Adductor block  . TOTAL SHOULDER ARTHROPLASTY Left 07/15/2012   Procedure: LEFT TOTAL SHOULDER ARTHROPLASTY;  Surgeon: Augustin Schooling, MD;  Location: [facility];  Service: Orthopedics;  Laterality: Left;  . TOTAL SHOULDER REVISION Right 12/21/2014   Procedure: RIGHT SHOULDER POLY EXCHANGE ;  Surgeon: Netta Cedars, MD;  Location: [employer];  Service: Orthopedics;  Laterality: Right;  . URETEROSCOPY  stent placement     OB History   No obstetric history on file.     Family History  Problem Relation Age of Onset  . Leukemia Brother   . Heart disease Mother   . Hypertension Mother   . Hyperlipidemia Mother   . Heart attack Mother   . Heart failure Mother   . Heart disease Maternal Grandfather   . Heart attack Maternal Grandfather   . Cancer Paternal Grandfather   . Lung cancer Paternal  Grandfather   . Cancer Maternal Uncle        bone    Social History   Tobacco Use  . Smoking status: Never Smoker  . Smokeless tobacco: Never Used  Vaping Use  . Vaping Use: Never used  Substance Use Topics  . Alcohol use: No    Alcohol/week: 0.0 standard drinks  . Drug use: No    Home Medications Prior to Admission medications   Medication Sig Start Date End Date Taking? Authorizing Provider  acetaminophen (TYLENOL) 500 MG tablet Take 2 tablets (1,000 mg total) by mouth every 8 (eight) hours. 08/27/16   Danae Orleans, PA-C  AMITIZA 24 MCG capsule Take 24 mcg by mouth 2 (two) times daily.  11/19/14   [provider]  carvedilol (COREG) 25 MG tablet Take 12.5 mg by mouth 2 (two) times daily. 05/29/15   [provider]  celecoxib (CELEBREX) 200 MG capsule Take 200 mg by mouth 2 (two) times daily. 03/22/15   [provider]  cholecalciferol (VITAMIN D) 1000 UNITS tablet Take 1,000 Units by mouth daily.    [provider]  citalopram (CELEXA) 20 MG tablet Take 20 mg by mouth at bedtime. 08/13/16   [provider]  CRANBERRY PO Take 1  tablet by mouth daily.    [provider]  docusate sodium (COLACE) 100 MG capsule Take 1 capsule (100 mg total) by mouth 2 (two) times daily. 08/27/16   Danae Orleans, PA-C  esomeprazole (NEXIUM) 40 MG capsule Take 40 mg by mouth daily before breakfast.    [provider]  ferrous sulfate 325 (65 FE) MG tablet Take 1 tablet (325 mg total) by mouth 3 (three) times daily after meals. 08/27/16   Danae Orleans, PA-C  furosemide (LASIX) 20 MG tablet Take two tablets (40 mg) daily as needed for swelling. 08/06/15   Deboraha Sprang, MD  hydrocortisone (CORTEF) 10 MG tablet Take 5-10 mg by mouth 2 (two) times daily with a meal. Take one tablet in the AM and one-half tablet in the afternoon.    [provider]  losartan-hydrochlorothiazide (HYZAAR) 100-12.5 MG tablet Take 1 tablet by mouth daily. IF LIGHTHEADEDNESS OR  DIZZINESS AND BLOOD PRESSURE RUNS SYSTOLIC 160 OR LESS CUT IN HALF 06/18/15   Barrett, Evelene Croon, PA-C  Multiple Vitamin (MULTIVITAMIN) tablet Take 1 tablet by mouth every morning.     [provider]  oxyCODONE (OXY IR/ROXICODONE) 5 MG immediate release tablet Take 1-3 tablets (5-15 mg total) by mouth every 4 (four) hours as needed for severe pain. 08/27/16   Danae Orleans, PA-C  polyethylene glycol (MIRALAX / GLYCOLAX) packet Take 17 g by mouth 2 (two) times daily. 08/27/16   Danae Orleans, PA-C  pramipexole (MIRAPEX) 1 MG tablet Take 1.5 mg by mouth at bedtime.  08/13/16   [provider]  promethazine (PHENERGAN) 25 MG tablet Take 1 tablet (25 mg total) by mouth every 6 (six) hours as needed for nausea or vomiting. 05/15/15   Netta Cedars, MD  tiZANidine (ZANAFLEX) 4 MG tablet Take 1.5 tablets (6 mg total) by mouth every 8 (eight) hours as needed for muscle spasms. 08/27/16   Danae Orleans, PA-C    Allergies    Codeine, Hydrocodone, and Robaxin [methocarbamol]  Review of Systems   Review of Systems All other systems reviewed and are negative  except that which was mentioned in HPI  Physical Exam Updated Vital Signs BP 139/73 (BP Location: Left Wrist)   Pulse 85  Temp 99.4 F (37.4 C) (Oral)   Resp 20   SpO2 97%   Physical Exam Vitals and nursing note reviewed.  Constitutional:      General: She is not in acute distress.    Appearance: She is well-developed.     Comments: Awake, alert  HENT:     Head: Normocephalic and atraumatic.     Nose: Nose normal.     Mouth/Throat:     Mouth: Mucous membranes are moist.     Pharynx: Oropharynx is clear.  Eyes:     Extraocular Movements: Extraocular movements intact.     Conjunctiva/sclera: Conjunctivae normal.     Pupils: Pupils are equal, round, and reactive to light.  Cardiovascular:     Rate and Rhythm: Normal rate and regular rhythm.     Heart sounds: Normal heart sounds. No murmur heard.   Pulmonary:     Effort: Pulmonary effort is normal. No respiratory distress.     Breath sounds: Normal breath sounds.  Abdominal:     General: Bowel sounds are normal. There is no distension.     Palpations: Abdomen is soft.     Tenderness: There is no abdominal tenderness.  Musculoskeletal:     Cervical back: Neck supple.  Skin:    General: Skin is warm and dry.  Neurological:     Mental Status: She is alert and oriented to person, place, and time.     Cranial Nerves: No cranial nerve deficit.     Motor: No abnormal muscle tone.     Deep Tendon Reflexes: Reflexes are normal and symmetric.     Comments: Fluent speech, normal finger-to-nose testing, negative pronator drift, no clonus 5/5 strength x all 4 extremities Unable to elicit patellar reflexes b/l but 2+ Achilles reflexes b/l Endorsing decreased sensation below knees, in groin, and up to umbilicus, endorsing normal sensation b/l thighs  Psychiatric:        Thought Content: Thought content normal.        Judgment: Judgment normal.     ED Results / Procedures / Treatments   Labs (all labs ordered are listed, but  only abnormal results are displayed) Labs Reviewed  SARS CORONAVIRUS 2 BY RT PCR (HOSPITAL ORDER, [facility])  BASIC METABOLIC PANEL  CBC WITH DIFFERENTIAL/PLATELET    EKG None  Radiology No results found.  Procedures Procedures (including critical care time)  Medications Ordered in ED Medications - No data to display  ED Course  I have reviewed the triage vital signs and the nursing notes.    MDM Rules/Calculators/A&P                          PT w/ decreased sensation of LE b/l with sparing of thighs. Intact achilles DTRs b/l. DDx includes cauda equina from progression of chronic disc herniation, less likely acute bony injury. She has no UE sx and it does not sound like sx have been traveling proximally throughout the week to suggest GB/AIDP. She will require MRI to r/o cauda equina however no MRI available here. Discussed ED to ED transfer w/ Dr. Sedonia Small who has accepted pt. I have ordered screening labs and COVID testing in the event that she requires surgery or admission. Currently she has ~100-14ml on bladder scan. Given no severe urinary retention, will hold off on foley catheter for now but I have explained she may require one in the future if she continues to retain.  Final Clinical Impression(s) / ED Diagnoses Final diagnoses:  Lower extremity numbness  Saddle anesthesia  Urinary retention    Rx / DC Orders ED Discharge Orders    None       Idamae Coccia, Wenda Overland, MD 08/10/19 1535

## 2019-08-11 ENCOUNTER — Inpatient Hospital Stay (HOSPITAL_COMMUNITY): Admission: RE | Admit: 2019-08-11 | Payer: BC Managed Care – PPO | Source: Ambulatory Visit

## 2019-08-11 ENCOUNTER — Inpatient Hospital Stay (HOSPITAL_COMMUNITY): Payer: Medicare Other

## 2019-08-11 DIAGNOSIS — Z79899 Other long term (current) drug therapy: Secondary | ICD-10-CM | POA: Diagnosis not present

## 2019-08-11 DIAGNOSIS — E271 Primary adrenocortical insufficiency: Secondary | ICD-10-CM | POA: Diagnosis present

## 2019-08-11 DIAGNOSIS — R200 Anesthesia of skin: Secondary | ICD-10-CM | POA: Diagnosis not present

## 2019-08-11 DIAGNOSIS — R52 Pain, unspecified: Secondary | ICD-10-CM | POA: Diagnosis not present

## 2019-08-11 DIAGNOSIS — N183 Chronic kidney disease, stage 3 unspecified: Secondary | ICD-10-CM | POA: Diagnosis present

## 2019-08-11 DIAGNOSIS — G251 Drug-induced tremor: Secondary | ICD-10-CM | POA: Diagnosis not present

## 2019-08-11 DIAGNOSIS — I456 Pre-excitation syndrome: Secondary | ICD-10-CM | POA: Diagnosis present

## 2019-08-11 DIAGNOSIS — G369 Acute disseminated demyelination, unspecified: Secondary | ICD-10-CM | POA: Diagnosis present

## 2019-08-11 DIAGNOSIS — Y92230 Patient room in hospital as the place of occurrence of the external cause: Secondary | ICD-10-CM | POA: Diagnosis not present

## 2019-08-11 DIAGNOSIS — I10 Essential (primary) hypertension: Secondary | ICD-10-CM | POA: Diagnosis not present

## 2019-08-11 DIAGNOSIS — Z87442 Personal history of urinary calculi: Secondary | ICD-10-CM | POA: Diagnosis not present

## 2019-08-11 DIAGNOSIS — Z20822 Contact with and (suspected) exposure to covid-19: Secondary | ICD-10-CM | POA: Diagnosis present

## 2019-08-11 DIAGNOSIS — G373 Acute transverse myelitis in demyelinating disease of central nervous system: Secondary | ICD-10-CM | POA: Diagnosis present

## 2019-08-11 DIAGNOSIS — T380X5A Adverse effect of glucocorticoids and synthetic analogues, initial encounter: Secondary | ICD-10-CM | POA: Diagnosis not present

## 2019-08-11 DIAGNOSIS — Z85828 Personal history of other malignant neoplasm of skin: Secondary | ICD-10-CM | POA: Diagnosis not present

## 2019-08-11 DIAGNOSIS — K219 Gastro-esophageal reflux disease without esophagitis: Secondary | ICD-10-CM | POA: Diagnosis present

## 2019-08-11 DIAGNOSIS — R609 Edema, unspecified: Secondary | ICD-10-CM | POA: Diagnosis not present

## 2019-08-11 DIAGNOSIS — I129 Hypertensive chronic kidney disease with stage 1 through stage 4 chronic kidney disease, or unspecified chronic kidney disease: Secondary | ICD-10-CM | POA: Diagnosis present

## 2019-08-11 DIAGNOSIS — R339 Retention of urine, unspecified: Secondary | ICD-10-CM | POA: Diagnosis present

## 2019-08-11 DIAGNOSIS — M5430 Sciatica, unspecified side: Secondary | ICD-10-CM | POA: Diagnosis present

## 2019-08-11 DIAGNOSIS — G2581 Restless legs syndrome: Secondary | ICD-10-CM | POA: Diagnosis present

## 2019-08-11 DIAGNOSIS — F419 Anxiety disorder, unspecified: Secondary | ICD-10-CM | POA: Diagnosis not present

## 2019-08-11 DIAGNOSIS — E876 Hypokalemia: Secondary | ICD-10-CM | POA: Diagnosis present

## 2019-08-11 DIAGNOSIS — Z6841 Body Mass Index (BMI) 40.0 and over, adult: Secondary | ICD-10-CM | POA: Diagnosis not present

## 2019-08-11 DIAGNOSIS — E785 Hyperlipidemia, unspecified: Secondary | ICD-10-CM | POA: Diagnosis present

## 2019-08-11 DIAGNOSIS — G4733 Obstructive sleep apnea (adult) (pediatric): Secondary | ICD-10-CM | POA: Diagnosis present

## 2019-08-11 DIAGNOSIS — D631 Anemia in chronic kidney disease: Secondary | ICD-10-CM | POA: Diagnosis present

## 2019-08-11 DIAGNOSIS — Z981 Arthrodesis status: Secondary | ICD-10-CM | POA: Diagnosis not present

## 2019-08-11 LAB — PROTEIN AND GLUCOSE, CSF
Glucose, CSF: 49 mg/dL (ref 40–70)
Total  Protein, CSF: 48 mg/dL — ABNORMAL HIGH (ref 15–45)

## 2019-08-11 LAB — COMPREHENSIVE METABOLIC PANEL
ALT: 16 U/L (ref 0–44)
AST: 20 U/L (ref 15–41)
Albumin: 3.7 g/dL (ref 3.5–5.0)
Alkaline Phosphatase: 58 U/L (ref 38–126)
Anion gap: 12 (ref 5–15)
BUN: 14 mg/dL (ref 8–23)
CO2: 22 mmol/L (ref 22–32)
Calcium: 9.2 mg/dL (ref 8.9–10.3)
Chloride: 103 mmol/L (ref 98–111)
Creatinine, Ser: 1.01 mg/dL — ABNORMAL HIGH (ref 0.44–1.00)
GFR calc Af Amer: >60 mL/min (ref >60)
GFR calc non Af Amer: 58 mL/min — ABNORMAL LOW (ref >60)
Glucose, Bld: 97 mg/dL (ref 70–99)
Potassium: 3.4 mmol/L — ABNORMAL LOW (ref 3.5–5.1)
Sodium: 137 mmol/L (ref 135–145)
Total Bilirubin: 0.8 mg/dL (ref 0.3–1.2)
Total Protein: 6.1 g/dL — ABNORMAL LOW (ref 6.5–8.1)

## 2019-08-11 LAB — HIV ANTIBODY (ROUTINE TESTING W REFLEX): HIV Screen 4th Generation wRfx: NONREACTIVE

## 2019-08-11 LAB — CSF CELL COUNT WITH DIFFERENTIAL
RBC Count, CSF: 15 /mm3 — ABNORMAL HIGH
Tube #: 3
WBC, CSF: 4 /mm3 (ref 0–5)

## 2019-08-11 LAB — CBC
HCT: 37 % (ref 36.0–46.0)
Hemoglobin: 11.7 g/dL — ABNORMAL LOW (ref 12.0–15.0)
MCH: 30.5 pg (ref 26.0–34.0)
MCHC: 31.6 g/dL (ref 30.0–36.0)
MCV: 96.6 fL (ref 80.0–100.0)
Platelets: 176 10*3/uL (ref 150–400)
RBC: 3.83 MIL/uL — ABNORMAL LOW (ref 3.87–5.11)
RDW: 12.6 % (ref 11.5–15.5)
WBC: 7.4 10*3/uL (ref 4.0–10.5)
nRBC: 0 % (ref 0.0–0.2)

## 2019-08-11 LAB — MAGNESIUM: Magnesium: 1.8 mg/dL (ref 1.7–2.4)

## 2019-08-11 LAB — PROTIME-INR
INR: 1.1 (ref 0.8–1.2)
Prothrombin Time: 13.3 seconds (ref 11.4–15.2)

## 2019-08-11 LAB — VITAMIN B12: Vitamin B-12: 376 pg/mL (ref 180–914)

## 2019-08-11 LAB — TSH: TSH: 3.119 u[IU]/mL (ref 0.350–4.500)

## 2019-08-11 LAB — APTT: aPTT: 27 seconds (ref 24–36)

## 2019-08-11 LAB — PHOSPHORUS: Phosphorus: 3.1 mg/dL (ref 2.5–4.6)

## 2019-08-11 MED ORDER — GADOBUTROL 1 MMOL/ML IV SOLN
10.0000 mL | Freq: Once | INTRAVENOUS | Status: AC | PRN
  Administered 2019-08-11: 10 mL via INTRAVENOUS

## 2019-08-11 MED ORDER — PRAMIPEXOLE DIHYDROCHLORIDE 1 MG PO TABS
1.5000 mg | ORAL_TABLET | Freq: Every day | ORAL | Status: DC
  Administered 2019-08-11 – 2019-08-23 (×13): 1.5 mg via ORAL
  Filled 2019-08-11 (×13): qty 2

## 2019-08-11 MED ORDER — PANTOPRAZOLE SODIUM 40 MG PO PACK
40.0000 mg | PACK | Freq: Every day | ORAL | Status: DC
  Administered 2019-08-12 – 2019-08-14 (×3): 40 mg via ORAL
  Filled 2019-08-11 (×8): qty 20

## 2019-08-11 MED ORDER — SODIUM CHLORIDE 0.9 % IV SOLN
1000.0000 mg | INTRAVENOUS | Status: AC
  Administered 2019-08-11 – 2019-08-15 (×5): 1000 mg via INTRAVENOUS
  Filled 2019-08-11 (×6): qty 8

## 2019-08-11 MED ORDER — CYANOCOBALAMIN 1000 MCG/ML IJ SOLN
1000.0000 ug | Freq: Once | INTRAMUSCULAR | Status: AC
  Administered 2019-08-11: 1000 ug via SUBCUTANEOUS
  Filled 2019-08-11: qty 1

## 2019-08-11 MED ORDER — CITALOPRAM HYDROBROMIDE 20 MG PO TABS
20.0000 mg | ORAL_TABLET | Freq: Every day | ORAL | Status: DC
  Administered 2019-08-11 – 2019-08-19 (×9): 20 mg via ORAL
  Filled 2019-08-11 (×9): qty 1

## 2019-08-11 MED ORDER — LOSARTAN POTASSIUM-HCTZ 100-12.5 MG PO TABS: 1.0000 | ORAL_TABLET | Freq: Every day | ORAL | Status: DC

## 2019-08-11 MED ORDER — HYDROCORTISONE 5 MG PO TABS: 5.0000 mg | ORAL_TABLET | Freq: Two times a day (BID) | ORAL | Status: DC

## 2019-08-11 MED ORDER — TIZANIDINE HCL 2 MG PO TABS
6.0000 mg | ORAL_TABLET | Freq: Three times a day (TID) | ORAL | Status: DC | PRN
  Administered 2019-08-12 – 2019-08-22 (×6): 6 mg via ORAL
  Filled 2019-08-11 (×9): qty 3

## 2019-08-11 MED ORDER — LUBIPROSTONE 24 MCG PO CAPS
24.0000 ug | ORAL_CAPSULE | Freq: Two times a day (BID) | ORAL | Status: DC
  Administered 2019-08-11 – 2019-08-18 (×14): 24 ug via ORAL
  Filled 2019-08-11 (×18): qty 1

## 2019-08-11 MED ORDER — OXYCODONE HCL 5 MG PO TABS
5.0000 mg | ORAL_TABLET | ORAL | Status: DC | PRN
  Administered 2019-08-11 – 2019-08-23 (×5): 5 mg via ORAL
  Filled 2019-08-11 (×5): qty 1

## 2019-08-11 MED ORDER — CARVEDILOL 12.5 MG PO TABS
12.5000 mg | ORAL_TABLET | Freq: Two times a day (BID) | ORAL | Status: DC
  Administered 2019-08-11 – 2019-08-15 (×8): 12.5 mg via ORAL
  Filled 2019-08-11 (×8): qty 1

## 2019-08-11 MED ORDER — LORAZEPAM 2 MG/ML IJ SOLN
1.0000 mg | INTRAMUSCULAR | Status: DC | PRN
  Administered 2019-08-11: 1 mg via INTRAVENOUS
  Filled 2019-08-11: qty 1

## 2019-08-11 NOTE — ED Notes (Signed)
Pt back from radiology laying flat. Pt aware she needs to continue laying flat. This RN called Radiology to confirm that CSF samples were sent to lab, they confirmed the samples were sent.

## 2019-08-11 NOTE — ED Notes (Signed)
Report given to [place]

## 2019-08-11 NOTE — Evaluation (Signed)
Physical Therapy Evaluation Patient Details Name: Connie Haynes MRN: [id number] DOB: 07-07-1955 Today's Date: 08/11/2019   History of Present Illness  Connie Haynes is a 64 y.o. 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  Undergoing further work up for transverse myelitis versus other deficiencies.  Clinical Impression  Patient presents with decreased mobility due to decreased sensation, strength and significant fear of falling as well as decreased balance and activity tolerance.  She demonstrates decreased proprioception, sharp/dull distinction as well as crude touch sensory deficits.  She seems to have intact temperature distinction.  She will benefit from skilled PT in the acute setting as well as resuming outpatient PT upon d/c.  She has her spouse and her brother who can assist.      Follow Up Recommendations Outpatient PT    Equipment Recommendations  None recommended by PT    Recommendations for Other Services       Precautions / Restrictions Precautions Precautions: Fall Precaution Comments: decreased LE sensation      Mobility  Bed Mobility Overal bed mobility: Needs Assistance Bed Mobility: Sit to Supine       Sit to supine: Supervision;HOB elevated   General bed mobility comments: increased time getting legs into bed  Transfers Overall transfer level: Needs assistance Equipment used: Rolling walker (2 wheeled) Transfers: Sit to/from Stand Sit to Stand: Min guard;Min assist         General transfer comment: initially more help given due to patient unsure of her abilities, then able to perform sit<>stand x 5 with limited UE support and minguard for  safety  Ambulation/Gait Ambulation/Gait assistance: Min assist Gait Distance (Feet): 90 Feet Assistive device: Rolling walker (2 wheeled) Gait Pattern/deviations: Step-to pattern;Step-through pattern;Decreased stride length;Wide base of support     General Gait Details: some difficulty with placement of feet, having to readjust them based on placement initially; difficulty with turns due to feeling feet go forward better than to side so scooting feet or pivoting them more for turns; at times walker too far out and had to readjust  Stairs            Wheelchair Mobility    Modified Rankin (Stroke Patients Only)       Balance Overall balance assessment: Needs assistance   Sitting balance-Leahy Scale: Good     Standing balance support: No upper extremity supported Standing balance-Leahy Scale: Fair Standing balance comment: static balance without UE support once standing, cannot move her feet without UE support                             Pertinent Vitals/Pain Pain Assessment: No/denies pain    Home Living Family/patient expects to be discharged to:: Private residence Living Arrangements: Spouse/significant other Available Help at Discharge: Family Type of Home: House Home Access: Stairs to enter   [employer] of Steps: 1 Home Layout: Two level;Able to live on main level with bedroom/bathroom Home Equipment: Gilford Rile - 2 wheels;Bedside commode;Cane - single point      Prior Function Level of Independence: Needs assistance   Gait / Transfers Assistance Needed: independent with gait and some ADL's; was still getting PT to work on  uneven terrain and different surfaces due to desire to walk to a waterfall           Hand Dominance        Extremity/Trunk Assessment   Upper Extremity Assessment Upper Extremity Assessment: Defer to OT evaluation    Lower Extremity Assessment Lower Extremity Assessment: LLE deficits/detail;RLE  deficits/detail RLE Deficits / Details: AROM WFL, strength hip flexion 3+/5, knee extension 4-/5 (reports due to can't feel resistance); ankle DF 4-/5 RLE Sensation: decreased light touch;decreased proprioception (hot/cold intact, sharp/dull impaired on LE's, proprioception impaired R more than L) LLE Deficits / Details: AROM WFL, strength hip flexion 3+/5, knee extension 4-/5 (reports due to can't feel resistance); ankle DF 4-/5 LLE Sensation: decreased light touch;decreased proprioception (hot/cold intact, sharp/dull impaired on LE's, proprioception impaired R more than L)       Communication   Communication: No difficulties  Cognition Arousal/Alertness: Awake/alert Behavior During Therapy: WFL for tasks assessed/performed (tearful due to symptoms after working so hard to get stronger with her legs) Overall Cognitive Status: Within Functional Limits for tasks assessed                                        General Comments      Exercises     Assessment/Plan    PT Assessment Patient needs continued PT services  PT Problem List Decreased strength;Impaired sensation;Decreased balance;Decreased knowledge of use of DME;Decreased knowledge of precautions;Decreased mobility;Decreased safety awareness       PT Treatment Interventions DME instruction;Therapeutic activities;Gait training;Therapeutic exercise;Patient/family education;Balance training;Functional mobility training;Stair training    PT Goals (Current goals can be found in the Care Plan section)  Acute Rehab PT Goals Patient Stated Goal: to return to normal PT Goal Formulation: With patient Time For Goal Achievement: 08/25/19 Potential to Achieve Goals: Good    Frequency Min 3X/week   Barriers to discharge        Co-evaluation               AM-PAC PT "6 Clicks" Mobility  Outcome Measure Help needed turning from your back to your side while in a flat bed without using bedrails?: None Help  needed moving from lying on your back to sitting on the side of a flat bed without using bedrails?: None Help needed moving to and from a bed to a chair (including a wheelchair)?: A Little Help needed standing up from a chair using your arms (e.g., wheelchair or bedside chair)?: A Little Help needed to walk in hospital room?: A Little Help needed climbing 3-5 steps with a railing? : A Little 6 Click Score: 20    End of Session Equipment Utilized During Treatment: Gait belt Activity Tolerance: Patient limited by fatigue Patient left: in bed;with call bell/phone within reach;with family/visitor present   PT Visit Diagnosis: Other abnormalities of gait and mobility (R26.89);Difficulty in walking, not elsewhere classified (R26.2);Other symptoms and signs involving the nervous system (R29.898)    Time: 5427-0623 PT Time Calculation (min) (ACUTE ONLY): 39 min   Charges:   PT Evaluation $PT Eval Moderate Complexity: 1 Mod PT Treatments $Gait Training: 8-22 mins $Therapeutic Activity: 8-22 mins        Magda Kiel, PT Acute Rehabilitation Services Pager:[phone] Office:[phone] 08/11/2019   Reginia Naas 08/11/2019, 6:37 PM

## 2019-08-11 NOTE — ED Notes (Signed)
Off floor to MRI

## 2019-08-11 NOTE — Plan of Care (Signed)
LP completed.  Prelim results not suggestive of infectious etiology. Not convincing for inflammatory etiology either. ?Infact (firocartiliginous embolism?)  Will take another look with day team before recommending further recs.   -- Amie [place], MD [facility]

## 2019-08-11 NOTE — Progress Notes (Addendum)
Reviewed MRI of cervical and thoracic spine, including the contrast images, and discussed with Dr. Rory Percy. We are in consensus that an longitudinally extensive inflammatory myelitis is the most likely etiology for the patient's dorsal spinal cord lesion with patchy enhancement. Neurosarcoidosis should also be considered. A rare case of a posterior spinal cord ischemic infarction with atypical enhancement is also possible, given her history of spinal cord stroke in her neck after her neck surgery 10 years ago with no residual deficits, but is felt to be less likely (also see Dr. Johny Chess comment in his initial consult note on fibrocartilaginous embolism causing spinal cord infarct in the setting of extensive history of chronic neck pain and neck surgeries, as a possible, but unlikely etiology). Also on the DDx is unusual enhancing presentation of subacute combined degeneration due to B12 deficiency, or copper deficiency. CSF results are not suggestive of an infectious etiology, given normal white count and mildly elevated protein.   A/R: 64 year old female with a one week history of progressive urinary retention, lower extremity numbness and weakness 1. Starting IV methylprednisolone, 1000 mg qd x 5 days 2. Obtaining a serum copper level.  3. Vitamin B12 level is normal at 376, somewhat at the low end of the normal range. Ordering methylmalonic acid and homocysteine levels to further assess. 4. NMO Ab pending.  5. Angiotensin-converting enzyme level is pending. 6. Other tests that we have recommended include: ANA, rheumatoid factor, SSA, SSB, double-stranded DNA, B12, TSH.  7. MRI brain with and without contrast has been ordered to assess for possible intracranial neurosarcoidosis lesions. 8. Flow cytometry and cytology added to the CSF labs   Electronically signed: Dr. Kerney Elbe

## 2019-08-11 NOTE — H&P (Signed)
History and Physical  Connie Haynes MLY:[id number] DOB: [birth year] DOA: 08/10/2019  Referring physician: Sharlett Iles, MD PCP: Connie Resides, MD  Patient coming from: Home  Chief Complaint: Numbness in lower extremities, urinary retention  HPI: Connie Haynes is a 64 y.o. 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  Patient states that about a week ago, she noted that she was unable to empty her bladder, she initially thought this was due to UTI since she has a history of frequent UTIs, on the next day in the morning, she lost feeling and sensation in both feet and calves and she reported a fall, this has since worsened progressively and she presented to med center ER, but she left due to 6-hour wait without being attended to, Haynes she went to her PCP who consulted her neurologist and she went back to [facility] ER where she was sent to [facility] for imaging of lumbar spine due to concern for cauda equina syndrome, as well as MRI of thorax due to complaint of leg weakness.  Denies fever, chills, chest pain, shortness of breath, nausea, vomiting or abdominal pain.  ED Course:  In the emergency department, was hemodynamically stable, work-up in the ED showed normal CBC and mild hypokalemia.  SARS coronavirus 2 was negative.  Lumbar puncture under fluoroscopic guidance was done and culture was pending.  MRI cervical spine without and with contrast and showed abnormal T2/STIR signal intensity throughout the mid and lower cervical spinal cord extending from C4-T3-4 with suggestion of acute transverse myelitis, which could be either infectious or inflammatory/autoimmune in nature.   Neurologist (Dr. Rory Haynes) was consulted and recommended spinal tap with plan to start high-dose steroids if no evidence of infection.  Hospitalist was asked to admit.  For further evaluation and management.  Review of Systems: Constitutional: Negative for chills and fever.  HENT: Negative for ear pain and sore throat.   Eyes: Negative for pain and visual disturbance.  Respiratory: Negative for cough, chest tightness and shortness of breath.   Cardiovascular: Negative for chest pain and palpitations.  Gastrointestinal: Negative for abdominal pain and vomiting.  Endocrine: Negative for polyphagia and polyuria.  Genitourinary: Negative for decreased urine volume, dysuria, enuresis, hematuria, vaginal discharge and vaginal pain.  Musculoskeletal: Negative for arthralgias and back pain.  Skin: Negative for color change and rash.  Allergic/Immunologic: Negative for immunocompromised state.  Neurological: Negative for tremors, syncope, speech difficulty, weakness, light-headedness and headaches.  Hematological: Does not bruise/bleed easily.  All other systems reviewed and are negative   Past Medical History:  Diagnosis Date  . Adrenal insufficiency ([place])   . Anemia    denies  . Anxiety   . Basal cell carcinoma    eye lid  . Chronic kidney disease, stage 3   . Cough    hx. dry cough chronic  . Disorder of tear duct system    frequent tearing of eyes  . GERD (gastroesophageal reflux disease)   . H/O: whooping cough 2000  . History of kidney infection 2015   ? sepsis  . History of kidney stones   . History of kidney stones   . Hyperlipidemia   . Hypertension   . IBS (irritable  bowel syndrome)    controlled with med  . Osteoarthritis    arthritis -joints-limited ROM shoulders more on right  . PONV (postoperative nausea and vomiting)     per pt hx of low BP following shoulder surgery in 05-2015; was managed in hospital with IV fluids and d/c BP being taken a tthe time; pt was dced and  F/U with endcrinoopgist who dx with adrenal insurffiency and started steroid therapy; treatmetnt with positive outome, now managed chronically on hydrocortisone tablets;  Marland Kitchen Sleep apnea    no longer using cpap  . Stroke [facility])    per patient "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 that was because of 3 compressed vertebrae  that were causing LLE tremors and weakneess; underwent physcal thereapy with return to normal ADLs with exception of occasional tremors in right hand   . UTI (lower urinary tract infection)   . Wolff-Parkinson-White (WPW) syndrome 1990s   had ablation done  and now managed on carvedilol    Past Surgical History:  Procedure Laterality Date  . ablation surgery     1993 [facility] (Dr. Hulen Haynes)  . APPENDECTOMY    . CARDIAC ELECTROPHYSIOLOGY STUDY AND ABLATION     '93 for WPW syndrome  . CARPAL TUNNEL RELEASE Right 12/21/2014   Procedure: RIGHT CARPAL TUNNEL RELEASE ;  Surgeon: Connie Cedars, MD;  Location: [place];  Service: Orthopedics;  Laterality: Right;  . CARPAL TUNNEL RELEASE Left   . CERVICAL SPINE SURGERY     cervical fusion with hardware retained  . COLON SURGERY     "colon detached from abdomen"  . COLONOSCOPY WITH PROPOFOL N/A 05/10/2014   Procedure: COLONOSCOPY WITH PROPOFOL;  Surgeon: Connie Craver, MD;  Location: [facility];  Service: Endoscopy;  Laterality: N/A;  . CYSTOSCOPY/RETROGRADE/URETEROSCOPY Left 12/18/2013   Procedure: CYSTOSCOPY/RETROGRADE/LEFT STENT;  Surgeon: Connie So, MD;  Location: [facility];  Service: Urology;  Laterality: Left;  . ESOPHAGOGASTRODUODENOSCOPY (EGD) WITH PROPOFOL N/A 05/10/2014   Procedure: ESOPHAGOGASTRODUODENOSCOPY (EGD) WITH PROPOFOL;  Surgeon: Connie Craver, MD;  Location[facility];  Service: Endoscopy;  Laterality: N/A;  . EYELID CARCINOMA EXCISION    . JOINT REPLACEMENT    . KNEE ARTHROSCOPY Left 07/16/2016  . REVERSE SHOULDER ARTHROPLASTY Right 06/29/2014   Procedure: REVERSE TOTAL SHOULDER ARTHROPLASTY;  Surgeon: Connie Cedars, MD;   Location: [place];  Service: Orthopedics;  Laterality: Right;  . REVISION TOTAL SHOULDER TO REVERSE TOTAL SHOULDER Left 05/15/2015  . REVISION TOTAL SHOULDER TO REVERSE TOTAL SHOULDER Left 05/15/2015   Procedure: LEFT SHOULDER REVISION TOTAL SHOULDER ARTHRPLASTY TO REVERSE TOTAL SHOULDER ARTHROPLASTY ;  Surgeon: Connie Cedars, MD;  Location: [place];  Service: Orthopedics;  Laterality: Left;  . tear duct surgery    . TOTAL ABDOMINAL HYSTERECTOMY    . TOTAL HIP ARTHROPLASTY     x 3(rt x2, lt x1)  . TOTAL KNEE ARTHROPLASTY Right 08/08/2013   Procedure: RIGHT TOTAL KNEE ARTHROPLASTY WITH SAPHENOUS NERVE SCAR EXCISION;  Surgeon: Mauri Pole, MD;  Location[facility];  Service: Orthopedics;  Laterality: Right;  . TOTAL KNEE ARTHROPLASTY Left 08/25/2016   Procedure: LEFT TOTAL KNEE ARTHROPLASTY;  Surgeon: Paralee Cancel, MD;  Location: [facility];  Service: Orthopedics;  Laterality: Left;  70 mins; Adductor block  . TOTAL SHOULDER ARTHROPLASTY Left 07/15/2012   Procedure: LEFT TOTAL SHOULDER ARTHROPLASTY;  Surgeon: Augustin Schooling, MD;  Location: [place];  Service: Orthopedics;  Laterality: Left;  . TOTAL SHOULDER REVISION Right 12/21/2014   Procedure: RIGHT SHOULDER POLY EXCHANGE ;  Surgeon: Connie Cedars, MD;  Location: [place];  Service: Orthopedics;  Laterality: Right;  . URETEROSCOPY  stent placement    Social History:  reports that she has never smoked. She has never used smokeless tobacco. She reports that she does not drink alcohol and does not use drugs.   Allergies  Allergen Reactions  . Codeine Nausea And Vomiting    Can take if takes phernergan  . Hydrocodone Nausea Only  . Robaxin [Methocarbamol] Rash    Family History  Problem Relation Age of Onset  . Leukemia Brother   . Heart disease Mother   . Hypertension Mother   . Hyperlipidemia Mother   . Heart attack Mother   . Heart failure Mother   . Heart disease Maternal Grandfather   . Heart attack Maternal Grandfather   . Cancer Paternal  Grandfather   . Lung cancer Paternal Grandfather   . Cancer Maternal Uncle        bone     Prior to Admission medications   Medication Sig Start Date End Date Taking? Authorizing Provider  acetaminophen (TYLENOL) 500 MG tablet Take 2 tablets (1,000 mg total) by mouth every 8 (eight) hours. 08/27/16   Danae Orleans, PA-C  AMITIZA 24 MCG capsule Take 24 mcg by mouth 2 (two) times daily.  11/19/14   [provider]  carvedilol (COREG) 25 MG tablet Take 12.5 mg by mouth 2 (two) times daily. 05/29/15   [provider]  celecoxib (CELEBREX) 200 MG capsule Take 200 mg by mouth 2 (two) times daily. 03/22/15   [provider]  cholecalciferol (VITAMIN D) 1000 UNITS tablet Take 1,000 Units by mouth daily.    [provider]  citalopram (CELEXA) 20 MG tablet Take 20 mg by mouth at bedtime. 08/13/16   [provider]  CRANBERRY PO Take 1 tablet by mouth daily.    [provider]  docusate sodium (COLACE) 100 MG capsule Take 1 capsule (100 mg total) by mouth 2 (two) times daily. 08/27/16   Danae Orleans, PA-C  esomeprazole (NEXIUM) 40 MG capsule Take 40 mg by mouth daily before breakfast.    [provider]  ferrous sulfate 325 (65 FE) MG tablet Take 1 tablet (325 mg total) by mouth 3 (three) times daily after meals. 08/27/16   Danae Orleans, PA-C  furosemide (LASIX) 20 MG tablet Take two tablets (40 mg) daily as needed for swelling. 08/06/15   Deboraha Sprang, MD  hydrocortisone (CORTEF) 10 MG tablet Take 5-10 mg by mouth 2 (two) times daily with a meal. Take one tablet in the AM and one-half tablet in the afternoon.    [provider]  losartan-hydrochlorothiazide (HYZAAR) 100-12.5 MG tablet Take 1 tablet by mouth daily. IF LIGHTHEADEDNESS OR  DIZZINESS AND BLOOD PRESSURE RUNS SYSTOLIC 500 OR LESS CUT IN HALF 06/18/15   Barrett, Evelene Croon, PA-C  Multiple Vitamin (MULTIVITAMIN) tablet Take 1 tablet by mouth every morning.     [provider]  oxyCODONE (OXY IR/ROXICODONE) 5 MG immediate release tablet Take 1-3 tablets (5-15 mg total) by mouth every 4 (four) hours as needed for severe pain. 08/27/16   Danae Orleans, PA-C  polyethylene glycol (MIRALAX / GLYCOLAX) packet Take 17 g by mouth 2 (two) times daily. 08/27/16   Danae Orleans, PA-C  pramipexole (MIRAPEX) 1 MG tablet Take 1.5 mg by mouth at bedtime.  08/13/16   [provider]  promethazine (PHENERGAN) 25 MG tablet Take 1 tablet (25 mg total) by mouth  every 6 (six) hours as needed for nausea or vomiting. 05/15/15   Connie Cedars, MD  tiZANidine (ZANAFLEX) 4 MG tablet Take 1.5 tablets (6 mg total) by mouth every 8 (eight) hours as needed for muscle spasms. 08/27/16   Danae Orleans, PA-C    Physical Exam: BP 137/81   Pulse 88   Temp 98.2 F (36.8 C) (Oral)   Resp 18   SpO2 93%   . General: 64 y.o. year-old female well developed well nourished in no acute distress.  Alert and oriented x3. Marland Kitchen HEENT:NCAT, PERRLA EOMI . Neck: Supple, trachea medial. . Cardiovascular: Regular rate and rhythm with no rubs or gallops.  No thyromegaly or JVD noted.  No lower extremity edema. 2/4 pulses in all 4 extremities. Marland Kitchen Respiratory: Clear to auscultation with no wheezes or rales. Good inspiratory effort. . Abdomen: Soft nontender nondistended with normal bowel sounds x4 quadrants. . Muskuloskeletal: No cyanosis, clubbing or edema noted bilaterally . Neuro: Decreased sensation and strength in lower extremities bilaterally , CN II-XII intact,  . Skin: No ulcerative lesions noted or rashes . Psychiatry: Judgement and insight appear normal. Mood is appropriate for condition and setting          Labs on Admission:  Basic Metabolic Panel: Recent Labs  Lab 08/10/19 1543  NA 136  K 3.4*  CL 100  CO2 26  GLUCOSE 107*  BUN 19  CREATININE 0.99  CALCIUM 9.2   Liver Function Tests: No results for input(s): AST, ALT, ALKPHOS, BILITOT, PROT, ALBUMIN in the last 168  hours. No results for input(s): LIPASE, AMYLASE in the last 168 hours. No results for input(s): AMMONIA in the last 168 hours. CBC: Recent Labs  Lab 08/10/19 1543  WBC 8.9  NEUTROABS 6.1  HGB 12.5  HCT 37.9  MCV 93.1  PLT 283   Cardiac Enzymes: No results for input(s): CKTOTAL, CKMB, CKMBINDEX, TROPONINI in the last 168 hours.  BNP (last 3 results) No results for input(s): BNP in the last 8760 hours.  ProBNP (last 3 results) No results for input(s): PROBNP in the last 8760 hours.  CBG: No results for input(s): GLUCAP in the last 168 hours.  Radiological Exams on Admission: MR THORACIC SPINE WO CONTRAST  Result Date: 08/10/2019 CLINICAL DATA:  Low back pain, lower extremity numbness, urinary problems EXAM: MRI THORACIC AND LUMBAR SPINE WITHOUT CONTRAST TECHNIQUE: Multiplanar and multiecho pulse sequences of the thoracic and lumbar spine were obtained without intravenous contrast. COMPARISON:  None. FINDINGS: MRI THORACIC SPINE Motion artifact is present Alignment:  Anteroposterior alignment is maintained. Vertebrae: Vertebral body heights are preserved. There is a probable vertebral body hemangioma at T4. There is no significant marrow edema. No suspicious osseous lesion. Cord: There is abnormal cord signal extending superiorly is from the T3-T4 level. Paraspinal and other soft tissues: Unremarkable. Disc levels: Mild degenerative changes are present. For example, there are minor disc bulges at T2-T3 and T11-T12. There is mild to moderate facet arthropathy. No high-grade canal or neural foraminal narrowing identified. MRI LUMBAR SPINE Motion artifact is present. Segmentation: Counting from above, there are 6 lumbar type vertebral bodies. Alignment: There is grade 1 anterolisthesis at L3-L4. Mild retrolisthesis is present at L4-L5. Vertebrae: Vertebral body heights are maintained apart from degenerative endplate irregularity primarily at L3-L4. Probable small hemangioma at L1. There is no  significant marrow edema or suspicious osseous lesion. Conus medullaris and cauda equina: Conus extends to the L1-L2 level. Conus and cauda equina appear normal. Paraspinal and other soft tissues:  Unremarkable. Disc levels: L1-L2:  No canal or foraminal stenosis. L2-L3:  Facet arthropathy.  No canal or foraminal stenosis L3-L4: Anterolisthesis with uncovering of disc bulge. Superimposed right foraminal protrusion. Facet arthropathy. No canal stenosis. Moderate right foraminal stenosis. No left foraminal stenosis. L4-L5: Disc bulge. Facet arthropathy. No canal or left foraminal stenosis. Mild right foraminal stenosis. L5-L6: Disc bulge eccentric to the left. Facet arthropathy. No canal or right foraminal stenosis. Mild to moderate left foraminal stenosis. L6-S1: No canal or foraminal stenosis. IMPRESSION: Motion artifact is present. Abnormal cord signal extending superiorly from the T3-T4 level into the cervical spine. Recommend contrast enhanced MRI of the cervical spine. Multilevel degenerative changes as detailed above. No high-grade canal stenosis. Right foraminal protrusion at L3-L4 causes moderate stenosis. These results were called by telephone at the time of interpretation on 08/10/2019 at 7:03 pm Electronically Signed   By: Macy Mis M.D.   On: 08/10/2019 19:07   MR LUMBAR SPINE WO CONTRAST  Result Date: 08/10/2019 CLINICAL DATA:  Low back pain, lower extremity numbness, urinary problems EXAM: MRI THORACIC AND LUMBAR SPINE WITHOUT CONTRAST TECHNIQUE: Multiplanar and multiecho pulse sequences of the thoracic and lumbar spine were obtained without intravenous contrast. COMPARISON:  None. FINDINGS: MRI THORACIC SPINE Motion artifact is present Alignment:  Anteroposterior alignment is maintained. Vertebrae: Vertebral body heights are preserved. There is a probable vertebral body hemangioma at T4. There is no significant marrow edema. No suspicious osseous lesion. Cord: There is abnormal cord signal  extending superiorly is from the T3-T4 level. Paraspinal and other soft tissues: Unremarkable. Disc levels: Mild degenerative changes are present. For example, there are minor disc bulges at T2-T3 and T11-T12. There is mild to moderate facet arthropathy. No high-grade canal or neural foraminal narrowing identified. MRI LUMBAR SPINE Motion artifact is present. Segmentation: Counting from above, there are 6 lumbar type vertebral bodies. Alignment: There is grade 1 anterolisthesis at L3-L4. Mild retrolisthesis is present at L4-L5. Vertebrae: Vertebral body heights are maintained apart from degenerative endplate irregularity primarily at L3-L4. Probable small hemangioma at L1. There is no significant marrow edema or suspicious osseous lesion. Conus medullaris and cauda equina: Conus extends to the L1-L2 level. Conus and cauda equina appear normal. Paraspinal and other soft tissues: Unremarkable. Disc levels: L1-L2:  No canal or foraminal stenosis. L2-L3:  Facet arthropathy.  No canal or foraminal stenosis L3-L4: Anterolisthesis with uncovering of disc bulge. Superimposed right foraminal protrusion. Facet arthropathy. No canal stenosis. Moderate right foraminal stenosis. No left foraminal stenosis. L4-L5: Disc bulge. Facet arthropathy. No canal or left foraminal stenosis. Mild right foraminal stenosis. L5-L6: Disc bulge eccentric to the left. Facet arthropathy. No canal or right foraminal stenosis. Mild to moderate left foraminal stenosis. L6-S1: No canal or foraminal stenosis. IMPRESSION: Motion artifact is present. Abnormal cord signal extending superiorly from the T3-T4 level into the cervical spine. Recommend contrast enhanced MRI of the cervical spine. Multilevel degenerative changes as detailed above. No high-grade canal stenosis. Right foraminal protrusion at L3-L4 causes moderate stenosis. These results were called by telephone at the time of interpretation on 08/10/2019 at 7:03 pm Electronically Signed   By:  Macy Mis M.D.   On: 08/10/2019 19:07   MR Cervical Spine W or Wo Contrast  Result Date: 08/10/2019 CLINICAL DATA:  Initial evaluation for abnormal cord signal intensity seen on prior MRI of the thoracic spine. EXAM: MRI CERVICAL SPINE WITHOUT AND WITH CONTRAST TECHNIQUE: Multiplanar and multiecho pulse sequences of the cervical spine, to include the craniocervical junction  and cervicothoracic junction, were obtained without and with intravenous contrast. CONTRAST:  60mL GADAVIST GADOBUTROL 1 MMOL/ML IV SOLN COMPARISON:  Prior thoracic spine MRI from earlier the same day. FINDINGS: Alignment: Examination moderately degraded by motion artifact. Straightening of the normal cervical lordosis. 2 mm anterolisthesis of C3 on C4, with trace anterolisthesis of C4 on C5 and T1 on T2, chronic and degenerative. Vertebrae: Susceptibility artifact from prior ACDF seen at C5-C7 with solid arthrodesis. Vertebral body height maintained without evidence for acute or chronic fracture. Bone marrow signal intensity within normal limits. Benign hemangioma noted within the T1 vertebral body. No worrisome osseous lesions. No abnormal marrow edema or enhancement. Cord: Abnormal diffuse T2/STIR signal intensity seen throughout the mid and lower cervical spinal cord, extending from the level of C4 through T3-4 as seen on corresponding thoracic spine MRI. Signal intensity is fairly diffuse and primarily central in location, with associated abnormal cord expansion, suggesting edema. Irregular eccentric patchy post-contrast enhancement seen throughout the areas affected (series 12, images 8, 9 on sagittal sequence, series 13, images 35, 27, 23 on axial sequence). Finding most suggestive of acute transverse myelitis. Superiorly, the spinal cord is relatively normal in appearance to the cervicomedullary junction. Posterior Fossa, vertebral arteries, paraspinal tissues: Visualized brain and posterior fossa are grossly within normal limits  on this motion degraded exam. Craniocervical junction normal. Paraspinous and prevertebral soft tissues within normal limits. Normal flow voids seen within the vertebral arteries bilaterally. Disc levels: C2-C3: Minimal disc bulge. Left-sided facet hypertrophy. No significant canal or foraminal stenosis. C3-C4: Anterolisthesis. Mild disc bulge with uncovertebral hypertrophy. Superimposed bilateral facet degeneration. No significant spinal stenosis. Moderate right worse than left C4 foraminal stenosis. C4-C5: Anterolisthesis. Mild disc bulge with uncovertebral hypertrophy. Bilateral facet degeneration. No significant spinal stenosis. Moderate bilateral C5 foraminal stenosis. C5-C6: Prior fusion. No residual spinal stenosis. Foramina are grossly patent. C6-C7: Prior fusion. No significant residual spinal stenosis. Foramina are grossly patent. C7-T1: Minimal disc bulge. Right greater than left facet hypertrophy. No significant canal or foraminal stenosis. IMPRESSION: 1. Abnormal T2/STIR signal intensity throughout the mid and lower cervical spinal cord, extending from C4 through T3-4, with associated patchy post-contrast enhancement. 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 2. Prior ACDF at C5-C7 without residual spinal stenosis. 3. Mild degenerative spondylosis and facet degeneration at C3-4 and C4-5 with resultant moderate bilateral C4 and C5 foraminal stenosis. Electronically Signed   By: Jeannine Boga M.D.   On: 08/10/2019 20:29   DG Lumbar Puncture Fluoro Guide  Result Date: 08/11/2019 CLINICAL DATA:  Transverse myelitis EXAM: DIAGNOSTIC LUMBAR PUNCTURE UNDER FLUOROSCOPIC GUIDANCE FLUOROSCOPY TIME:  Fluoroscopy Time:  30 seconds Radiation Exposure Index (if provided by the fluoroscopic device): Number of Acquired Spot Images: 0 PROCEDURE: Informed consent was obtained from the patient prior to the procedure, including  potential complications of headache, allergy, and pain. With the patient prone, the lower back was prepped with Betadine. 1% Lidocaine was used for local anesthesia. Lumbar puncture was performed at the L4-5 level using a 20 gauge needle with return of clear CSF with an opening pressure of 20 cm water. Ten ml of CSF were obtained for laboratory studies. The patient tolerated the procedure well and there were no apparent complications. IMPRESSION: Successful lumbar puncture under fluoroscopic guidance. Electronically Signed   By: Rolm Baptise M.D.   On: 08/11/2019 00:06    EKG: I independently viewed the EKG done and my findings are as followed: This  was not done in the ED  Assessment/Plan Present on Admission: . Transverse myelitis ([place]) . Essential hypertension, benign . Morbid obesity ([place])  Principal Problem:   Transverse myelitis ([place]) Active Problems:   Essential hypertension, benign   Morbid obesity ([facility])   Adrenal insufficiency (Addison's disease) ([place])   Questionable transverse myelitis possibly due to infectious vs autoimmune vs inflammatory MRI cervical/thoracic/lumbar spine showed  lower cervical spinal cord extending from C4-T3-4 with suggestion of acute transverse myelitis, which could be either infectious or inflammatory/autoimmune in nature.   Neurologist (Dr. Fletcher Anon) was consulted by ED team, spinal tap was done,ACE levels,ANA, RF,SSA, SSB,B12, DsDNA, neuromyelitis optica autoantibody IgG and anti-Jo 1 antibody were ordered and pending.  Neurologist consult appreciated No indication for antibiotics or steroid at this time until further recommendation from neurologist Continue fall precaution and neuro checks Continue PT/OT eval and treat  Essential hypertension Continue Coreg and Hyzaar per home regimen when med rec is updated  GERD Continue Protonix when med rec is updated  Adrenal insufficiency Continue home hydrocortisone when med rec is updated  Morbid  obesity Patient will continue with outpatient PCP for weight loss program  DVT prophylaxis: SCDs  Code Status: Full code  Family Communication: None at bedside  Disposition Plan:  Patient is from:                        home Anticipated DC to:                   SNF or family members home Anticipated DC date:               2-3 days Anticipated DC barriers:         Patient lower extremity numbness and weakness improvement pending further neurology recommendations  Consults called: Neurologist (Dr. Rory Haynes)  Admission status: Inpatient admission due to ongoing evaluation of cause of patient's lower extremity weakness and numbness.  Bernadette Hoit MD [facility]  If 7PM-7AM, please contact night-coverage [email or web address]  08/11/2019, 3:16 AM

## 2019-08-11 NOTE — ED Notes (Signed)
Patient c/o 10/10 pain to her IV site. Stating it hurt worst than her lumbar puncture. Site assess no signs of infiltration noted. Patient repeating asking to room IV. IV removed.

## 2019-08-11 NOTE — Progress Notes (Signed)
[place] OF CARE NOTE Patient: Connie Haynes YBW:[id number]   PCP: [name], MD DOB: [birth year]   DOA: 08/10/2019   DOS: 08/11/2019    Patient was admitted by my colleague earlier on 08/11/2019. I have reviewed the H&P as well as assessment and plan and agree with the same. Important changes in the plan are listed below.  Plan of care: Principal Problem:   Transverse myelitis ([place]) Active Problems:   Essential hypertension, benign   Morbid obesity ([place])   Adrenal insufficiency (Addison's disease) ([place]) Resuming home medication.  Author: Berle Mull, MD [facility] 08/11/2019 7:08 PM   If 7PM-7AM, please contact night-coverage at [email or web address]

## 2019-08-11 NOTE — ED Notes (Signed)
MS Breakfast Ordered 

## 2019-08-12 LAB — HOMOCYSTEINE: Homocysteine: 10.6 umol/L (ref 0.0–17.2)

## 2019-08-12 LAB — EXTRACTABLE NUCLEAR ANTIGEN ANTIBODY
ENA SM Ab Ser-aCnc: <0.2 AI (ref 0.0–0.9)
Ribonucleic Protein: 0.2 AI (ref 0.0–0.9)
SSA (Ro) (ENA) Antibody, IgG: <0.2 AI (ref 0.0–0.9)
SSB (La) (ENA) Antibody, IgG: <0.2 AI (ref 0.0–0.9)
Scleroderma (Scl-70) (ENA) Antibody, IgG: <0.2 AI (ref 0.0–0.9)
ds DNA Ab: <1 IU/mL (ref 0–9)

## 2019-08-12 LAB — URINE CULTURE: Culture: NO GROWTH

## 2019-08-12 LAB — ANGIOTENSIN CONVERTING ENZYME: Angiotensin-Converting Enzyme: 52 U/L (ref 14–82)

## 2019-08-12 LAB — RHEUMATOID FACTOR: Rheumatoid fact SerPl-aCnc: <10 IU/mL (ref 0.0–13.9)

## 2019-08-12 LAB — ANTI-JO 1 ANTIBODY, IGG: Anti JO-1: <0.2 AI (ref 0.0–0.9)

## 2019-08-12 LAB — CERULOPLASMIN: Ceruloplasmin: 26.5 mg/dL (ref 19.0–39.0)

## 2019-08-12 MED ORDER — SENNOSIDES-DOCUSATE SODIUM 8.6-50 MG PO TABS
1.0000 | ORAL_TABLET | Freq: Two times a day (BID) | ORAL | Status: DC
  Administered 2019-08-12 – 2019-08-24 (×23): 1 via ORAL
  Filled 2019-08-12 (×23): qty 1

## 2019-08-12 MED ORDER — LORAZEPAM 0.5 MG PO TABS: 0.5000 mg | ORAL_TABLET | ORAL | Status: DC | PRN

## 2019-08-12 MED ORDER — ACETAMINOPHEN 325 MG PO TABS
650.0000 mg | ORAL_TABLET | Freq: Four times a day (QID) | ORAL | Status: DC | PRN
  Administered 2019-08-16: 650 mg via ORAL

## 2019-08-12 MED ORDER — ONDANSETRON HCL 4 MG/2ML IJ SOLN
4.0000 mg | Freq: Four times a day (QID) | INTRAMUSCULAR | Status: DC | PRN
  Administered 2019-08-16 – 2019-08-18 (×2): 4 mg via INTRAVENOUS
  Filled 2019-08-12 (×2): qty 2

## 2019-08-12 NOTE — Evaluation (Signed)
Occupational Therapy Evaluation Patient Details Name: Connie Haynes MRN: [id number] DOB: 31-Jan-1956 Today's Date: 08/12/2019    History of Present Illness Connie Haynes is a 64 y.o. 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  Undergoing further work up for transverse myelitis versus other deficiencies.   Clinical Impression   This 64 y/o female presents with the above. PTA pt reports overall independent with ADL and functional mobility. Pt standing at sink upon arrival pleasant and willing to participate in therapy session. Pt overall requiring minA for short distance mobility in room/hallway using RW - pt fatigues with increased distances and with x1 instance of increased unsteadiness while in hallway requiring +2 to stabilize - given standing rest break pt able to continue into room. She currently requires up to [facility] for LB ADL given difficulty reaching her feet. Pt to benefit from continued acute OT services and recommend followup OT services ([place] vs outpt, pending progress) to maximize her overall safety and independence with ADL and mobility. Will follow.     Follow Up Recommendations  Outpatient OT;Home health OT;Supervision/Assistance - 24 hour (outpt vs HH pending progress)    Equipment Recommendations  None recommended by OT           Precautions / Restrictions Precautions Precautions: Fall Precaution Comments: decreased LE sensation Restrictions Weight Bearing Restrictions: No      Mobility Bed Mobility Overal bed mobility: Needs Assistance             General bed mobility comments: pt received standing at sink upon arrival to room, left seated EOB with  spouse present end of session  Transfers Overall transfer level: Needs assistance Equipment used: Rolling walker (2 wheeled) Transfers: Sit to/from Stand Sit to Stand: Min guard         General transfer comment: for safety and balance     Balance Overall balance assessment: Needs assistance   Sitting balance-Leahy Scale: Good     Standing balance support: No upper extremity supported;During functional activity;Bilateral upper extremity supported Standing balance-Leahy Scale: Fair Standing balance comment: static balance without UE support once standing, cannot move her feet without UE support                           ADL either performed or assessed with clinical judgement   ADL Overall ADL's : Needs assistance/impaired Eating/Feeding: Modified independent;Sitting   Grooming: Min guard;Minimal assistance;Standing Grooming Details (indicate cue type and reason): intermittent assist for balance - pt standing at sink upon arrival to room  Upper Body Bathing: Supervision/ safety;Set up;Sitting   Lower Body Bathing: Moderate assistance;Sit to/from stand;Sitting/lateral leans   Upper Body Dressing : Set up;Sitting   Lower Body Dressing: Moderate assistance;Sitting/lateral leans;Sit to/from stand Lower Body Dressing Details (indicate cue type and reason): pt with difficulty reaching her feet at this time - discussed compensatory techniques including use of sock aide vs "hiking" hip onto EOB to reach her feet more easily, pt practicing hip hike this session but will benefit from continued practice - reports personal goal of being able to manage her socks on her own again  Toilet Transfer: Minimal assistance;Ambulation;RW Toilet Transfer Details (indicate cue type and reason):  simulated via transfer to/from [place] and Hygiene: Minimal assistance;Sit to/from Nurse, children's Details (indicate cue type and reason): discussed  using 3:1 as shower seat  Functional mobility during ADLs: Minimal assistance;Rolling walker General ADL Comments: pt able to mobilize short distance into hallway, but with x1 episode of unsteadiness requiring +2 to steady and chair pulled up for safety (RN present in hallway during this time) - pt didn't need to use chair ulitmately just taking standing rest break prior to completion of mobility back into room                          Pertinent Vitals/Pain Pain Assessment: No/denies pain     Hand Dominance     Extremity/Trunk Assessment Upper Extremity Assessment Upper Extremity Assessment: Overall WFL for tasks assessed   Lower Extremity Assessment Lower Extremity Assessment: Defer to PT evaluation       Communication Communication Communication: No difficulties   Cognition Arousal/Alertness: Awake/alert Behavior During Therapy: WFL for tasks assessed/performed;Anxious Overall Cognitive Status: Within Functional Limits for tasks assessed                                     General Comments  spouse present during session     Exercises     Shoulder Instructions      Home Living Family/patient expects to be discharged to:: Private residence Living Arrangements: Spouse/significant other Available Help at Discharge: Family Type of Home: House Home Access: Stairs to enter [employer] of Steps: 1   Home Layout: Two level;Able to live on main level with bedroom/bathroom     Bathroom Shower/Tub: Occupational psychologist: Standard     Home Equipment: Environmental consultant - 2 wheels;Bedside commode;Cane - single point          Prior Functioning/Environment Level of Independence: Needs assistance  Gait / Transfers Assistance Needed: independent with gait and some ADL's; was still getting PT to work on uneven terrain and different surfaces due to desire to walk to a waterfall ADL's / Homemaking Assistance Needed: reports typically  independent with ADL tasks             OT Problem List: Decreased strength;Decreased range of motion;Decreased activity tolerance;Impaired balance (sitting and/or standing);Decreased knowledge of use of DME or AE;Decreased coordination      OT Treatment/Interventions: Self-care/ADL training;Therapeutic exercise;Energy conservation;DME and/or AE instruction;Therapeutic activities;Patient/family education;Balance training    OT Goals(Current goals can be found in the care plan section) Acute Rehab OT Goals Patient Stated Goal: to return to normal; to be able to go hiking again (to see a waterfall) OT Goal Formulation: With patient Time For Goal Achievement: 08/26/19 Potential to Achieve Goals: Good  OT Frequency: Min 2X/week   Barriers to D/C:            Co-evaluation              AM-PAC OT "6 Clicks" Daily Activity     Outcome Measure Help from another person eating meals?: None Help from another person taking care of personal grooming?: A Little Help from another person toileting, which includes using toliet, bedpan, or urinal?: A Little Help from another person bathing (including washing, rinsing, drying)?: A Lot Help from another person to put on and taking off regular upper body clothing?: A Little Help from another person to put  on and taking off regular lower body clothing?: A Lot 6 Click Score: 17   End of Session Equipment Utilized During Treatment: Gait belt;Rolling walker Nurse Communication: Mobility status  Activity Tolerance: Patient tolerated treatment well Patient left: with call bell/phone within reach;with family/visitor present;Other (comment) (seated EOB)  OT Visit Diagnosis: Unsteadiness on feet (R26.81);Other abnormalities of gait and mobility (R26.89)                Time: 4259-5638 OT Time Calculation (min): 22 min Charges:  OT General Charges $OT Visit: 1 Visit OT Evaluation $OT Eval Moderate Complexity: 1 Mod  Lou Cal, OT Acute  Rehabilitation Services Pager [phone] Office [phone]  Raymondo Band 08/12/2019, 4:22 PM

## 2019-08-12 NOTE — Progress Notes (Signed)
[facility] Progress Note  Patient: Connie Haynes    ZOX:[id number]  DOA: 08/10/2019     Date of Service: the patient was seen and examined on 08/12/2019  Brief hospital course: Connie Haynes is a 64 y.o. 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 CSF Prelim results not suggestive of infectious etiology. Not convincing for inflammatory etiology either. Per neuro an longitudinally extensive inflammatory myelitis is the most likely etiology for the patient's dorsal spinal cord lesion with patchy enhancement on MRI.   Currently plan is continue IV steroids.  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   Neurologist was consulted by ED team, spinal tap was done, ACE levels,ANA, RF,SSA, SSB,B12, DsDNA, neuromyelitis optica autoantibody IgG and anti-Jo 1 antibody were ordered and pending.  Neurologist consult appreciated No indication for antibiotics Neuro started the pt on high dose 1gm IV solumedrol x 5 days. MRI Brain shows right sided inferior frontal lobe/medial temporal lobe increased T2 and FLAIR signal and a 9 mm focus of contrast enhancement suggestive of subacute infarction versus demyelinating disease versus mass. But based on the spinal cord abnormalities shown yesterday, demyelinating disease is favored.  Continue fall precaution and neuro checks Continue PT/OT eval and treat  Essential  hypertension Continue Coreg  GERD Continue Protonix  Adrenal insufficiency Hold home hydrocortisone while on IV steroids.  More likely she will be on slow taper so holding cortef until the pt is back to equivalent home regimen of steroids will be the most sensible approach.   Morbid obesity Check weight.  Outpatient weight 122kg.   Anxiety and tremors Due to steroids  Add ativan Continue celexa  HLD  Continue statin  H/o CVA No residual deficit  Monitor   OSA Restless leg syndrome Continue pramipexole home regimen  CKD 2 Follows up with nephrology as outpatient  Stable  Diet: cardiac diet DVT Prophylaxis: Subcutaneous Lovenox  SCDs Start: 08/11/19 0215    Advance goals of care discussion: Full code  Family Communication: no family was present at bedside, at the time of interview.   Disposition:  Status is: Inpatient  Remains inpatient appropriate because:IV treatments appropriate due to intensity of illness or inability to take PO   Dispo: The patient is from: Home              Anticipated d/c is to: Home              Anticipated d/c date is: > 3 days              Patient currently is not medically stable to d/c.  Subjective: reports tremors, slept okay last night. No nausea no vomiting. No fever or chills   Physical Exam:  General: Appear in mild distress, no Rash; Oral Mucosa Clear, moist. no Abnormal Neck Mass Or lumps, Conjunctiva normal  Cardiovascular: S1 and S2 Present, no Murmur, Respiratory: good respiratory effort, Bilateral Air entry present and Clear to Auscultation, no Crackles, no wheezes Abdomen: Bowel Sound [present, Soft and nop tenderness Extremities: no  Pedal edema, no calf tenderness Neurology: alert and oriented to time, place, and person affect appropriate. no new focal deficit Gait not checked due to patient safety concerns  Vitals:   08/11/19 1428 08/11/19 1758 08/12/19 0021 08/12/19 0628  BP: (!) 158/84 (!) 143/60 (!)  142/77 (!) 142/74  Pulse: 66 73 72 70  Resp: 16 16 16 17   Temp: 98.5 F (36.9 C) 98.2 F (36.8 C) 98 F (36.7 C) 97.8 F (36.6 C)  TempSrc: Oral Oral Oral Oral  SpO2: 98% 90% 93% 100%    Intake/Output Summary (Last 24 hours) at 08/12/2019 8676 Last data filed at 08/11/2019 1555 Gross per 24 hour  Intake 288.36 ml  Output --  Net 288.36 ml   There were no vitals filed for this visit.  Data Reviewed: I have personally reviewed and interpreted daily labs, tele strips, imagings as discussed above. I reviewed all nursing notes, pharmacy notes, vitals, pertinent old records I have discussed plan of care as described above with RN and patient/family.  CBC: Recent Labs  Lab 08/10/19 1543 08/11/19 0234  WBC 8.9 7.4  NEUTROABS 6.1  --   HGB 12.5 11.7*  HCT 37.9 37.0  MCV 93.1 96.6  PLT 283 195   Basic Metabolic Panel: Recent Labs  Lab 08/10/19 1543 08/11/19 0234  NA 136 137  K 3.4* 3.4*  CL 100 103  CO2 26 22  GLUCOSE 107* 97  BUN 19 14  CREATININE 0.99 1.01*  CALCIUM 9.2 9.2  MG  --  1.8  PHOS  --  3.1    Studies: MR BRAIN W WO CONTRAST  Result Date: 08/11/2019 CLINICAL DATA:  Difficulty with urination beginning about 1 week ago. Abnormal sensation of the lower extremities. EXAM: MRI HEAD WITHOUT AND WITH CONTRAST TECHNIQUE: Multiplanar, multiecho pulse sequences of the brain and surrounding structures were obtained without and with intravenous contrast. CONTRAST:  55mL GADAVIST GADOBUTROL 1 MMOL/ML IV SOLN COMPARISON:  Head CT 12/23/2015.  Spinal MRI studies done yesterday. FINDINGS: Brain: Diffusion imaging does not show any acute or subacute infarction or other cause of restricted diffusion. Brainstem and cerebellum are normal. Cerebral hemispheres show a 7 mm focus of T2 and FLAIR signal within the basal ganglia and radiating white matter tracts on the left. Additionally, there is abnormal T2 and FLAIR signal at the base of the brain on the right affecting the inferior  frontal and inferior medial temporal lobe. There is a 9 mm focus of contrast enhancement in that region. No restricted diffusion. No mass lesion, hemorrhage, hydrocephalus or extra-axial collection. Vascular: Major vessels at the base of the brain show flow. Skull and upper cervical spine: Negative Sinuses/Orbits: Clear/normal Other: None IMPRESSION: Abnormality at the inferior frontal lobe/medial temporal lobe on the right with a region of increased T2 and FLAIR signal and a 9 mm focus of contrast enhancement. Differential diagnosis is late subacute infarction versus demyelinating disease versus mass. Based on the spinal cord abnormalities shown yesterday, demyelinating disease is favored. A focus of cerebritis seems least likely. Neuro sarcoid is also being considered in the clinical differential diagnosis of the spinal disease, which could possibly be consistent with this brain lesion. 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 Electronically Signed   By: Nelson Chimes M.D.   On: 08/11/2019 12:11    Scheduled Meds: . carvedilol  12.5 mg Oral BID  . citalopram  20 mg Oral QHS  . lubiprostone  24 mcg Oral BID  .  pantoprazole sodium  40 mg Oral Daily  . pramipexole  1.5 mg Oral QHS  . senna-docusate  1 tablet Oral BID   Continuous Infusions: . methylPREDNISolone (SOLU-MEDROL) injection Stopped (08/11/19 1247)   PRN Meds: acetaminophen, LORazepam, ondansetron (ZOFRAN) IV, oxyCODONE, tiZANidine  Time spent: 35 minutes  Author: Berle Mull, MD [facility] 08/12/2019 8:06 AM  To reach On-call, see care teams to locate the attending and reach out via [email or web address]. Between 7PM-7AM, please contact night-coverage If you still have difficulty reaching the attending provider, please page the [facility] (Director on Call) for [facility] on amion for assistance.

## 2019-08-12 NOTE — Progress Notes (Addendum)
Subjective: Patient awake, alert, NAD  Objective: Current vital signs: BP (!) 142/74 (BP Location: Left Arm)   Pulse 70   Temp 97.8 F (36.6 C) (Oral)   Resp 17   SpO2 100%  Vital signs in last 24 hours: Temp:  [97.8 F (36.6 C)-98.5 F (36.9 C)] 97.8 F (36.6 C) (07/03 0628) Pulse Rate:  [66-73] 70 (07/03 0628) Resp:  [16-18] 17 (07/03 0628) BP: (139-158)/(60-84) 142/74 (07/03 0628) SpO2:  [90 %-100 %] 100 % (07/03 0628)  Intake/Output from previous day: 07/02 0701 - 07/03 0700 In: 288.4 [P.O.:240; IV Piggyback:48.4] Out: -  Intake/Output this shift: No intake/output data recorded. Nutritional status:  Diet Order            Diet Heart Room service appropriate? Yes; Fluid consistency: Thin  Diet effective now                General exam: Lungs: respirations normal Cardiac:pulses palpable Skin: w/d/I  Neurologic Exam: Ment: awake, alert, oriented, able to follow commands CN: VFF, PERRL, EOEMI, face symmetric. Tongue protrudes midline Motor: able to raise all 4 anti-gravity Sensation: face intact to LT, arms intact to LT. Left leg: describes LT as presssure or cool temp below the knee. Right leg states LT feels like pressure, but able to feel cool temp above knee and below knee.  Cerebellar: LUE with dysmetria and action tremor (action tremor is not new, but she states it is worse)  Lab Results: Results for orders placed or performed during the hospital encounter of 08/10/19 (from the past 48 hour(s))  Basic metabolic panel     Status: Abnormal   Collection Time: 08/10/19  3:43 PM  Result Value Ref Range   Sodium 136 135 - 145 mmol/L   Potassium 3.4 (L) 3.5 - 5.1 mmol/L   Chloride 100 98 - 111 mmol/L   CO2 26 22 - 32 mmol/L   Glucose, Bld 107 (H) 70 - 99 mg/dL    Comment: Glucose reference range applies only to samples taken after fasting for at least 8 hours.   BUN 19 8 - 23 mg/dL   Creatinine, Ser 0.99 0.44 - 1.00 mg/dL   Calcium 9.2 8.9 - 10.3 mg/dL    GFR calc non Af Amer 59 (L) >60 mL/min   GFR calc Af Amer >60 >60 mL/min   Anion gap 10 5 - 15    Comment: Performed at [facility], [address] [place], [place] [address]  CBC with Differential     Status: None   Collection Time: 08/10/19  3:43 PM  Result Value Ref Range   WBC 8.9 4.0 - 10.5 K/uL   RBC 4.07 3.87 - 5.11 MIL/uL   Hemoglobin 12.5 12.0 - 15.0 g/dL   HCT 37.9 36 - 46 %   MCV 93.1 80.0 - 100.0 fL   MCH 30.7 26.0 - 34.0 pg   MCHC 33.0 30.0 - 36.0 g/dL   RDW 12.8 11.5 - 15.5 %   Platelets 283 150 - 400 K/uL   nRBC 0.0 0.0 - 0.2 %   Neutrophils Relative % 70 %   Neutro Abs 6.1 1.7 - 7.7 K/uL   Lymphocytes Relative 18 %   Lymphs Abs 1.6 0.7 - 4.0 K/uL   Monocytes Relative 7 %   Monocytes Absolute 0.7 0 - 1 K/uL   Eosinophils Relative 4 %   Eosinophils Absolute 0.4 0 - 0 K/uL   Basophils Relative 1 %   Basophils Absolute  0.1 0 - 0 K/uL   Immature Granulocytes 0 %   Abs Immature Granulocytes 0.03 0.00 - 0.07 K/uL    Comment: Performed at [facility], [address] [place], [place] [address]  SARS Coronavirus 2 by RT PCR (hospital order, performed in [facility] hospital lab) Nasopharyngeal Nasopharyngeal Swab     Status: None   Collection Time: 08/10/19  3:43 PM   Specimen: Nasopharyngeal Swab  Result Value Ref Range   SARS Coronavirus 2 NEGATIVE NEGATIVE    Comment: (NOTE) SARS-CoV-2 target nucleic acids are NOT DETECTED.  The SARS-CoV-2 RNA is generally detectable in upper and lower respiratory specimens during the acute phase of infection. The lowest concentration of SARS-CoV-2 viral copies this assay can detect is 250 copies / mL. A negative result does not preclude SARS-CoV-2 infection and should not be used as the sole basis for treatment or other patient management decisions.  A negative result may occur with improper specimen collection / handling, submission of specimen other than nasopharyngeal swab, presence of viral  mutation(s) within the areas targeted by this assay, and inadequate number of viral copies (<250 copies / mL). A negative result must be combined with clinical observations, patient history, and epidemiological information.  Fact Sheet for Patients:   StrictlyIdeas.no  Fact Sheet for Healthcare Providers: BankingDealers.co.za  This test is not yet approved or  cleared by the [place] FDA and has been authorized for detection and/or diagnosis of SARS-CoV-2 by FDA under an Emergency Use Authorization (EUA).  This EUA will remain in effect (meaning this test can be used) for the duration of the COVID-19 declaration under Section 564(b)(1) of the Act, 21 U.S.C. section 360bbb-3(b)(1), unless the authorization is terminated or revoked sooner.  Performed at [facility], [address] [place], [place] [address]   Urinalysis, Routine w reflex microscopic     Status: Abnormal   Collection Time: 08/10/19  7:00 PM  Result Value Ref Range   Color, Urine STRAW (A) YELLOW   APPearance CLEAR CLEAR   Specific Gravity, Urine 1.010 1.005 - 1.030   pH 5.0 5.0 - 8.0   Glucose, UA NEGATIVE NEGATIVE mg/dL   Hgb urine dipstick NEGATIVE NEGATIVE   Bilirubin Urine NEGATIVE NEGATIVE   Ketones, ur NEGATIVE NEGATIVE mg/dL   Protein, ur NEGATIVE NEGATIVE mg/dL   Nitrite NEGATIVE NEGATIVE   Leukocytes,Ua NEGATIVE NEGATIVE    Comment: Performed at [place] [address], [place], [place] [address]  CSF culture with Stat gram stain     Status: None (Preliminary result)   Collection Time: 08/10/19  9:46 PM   Specimen: CSF; Cerebrospinal Fluid  Result Value Ref Range   Specimen Description CSF    Special Requests NONE    Gram Stain      WBC PRESENT,BOTH PMN AND MONONUCLEAR NO ORGANISMS SEEN CYTOSPIN SMEAR Performed at [facility], [place] [address] [place], [place] [address]    Culture PENDING    Report Status PENDING    CSF cell count with differential     Status: Abnormal   Collection Time: 08/10/19 11:56 PM  Result Value Ref Range   Tube # 3    Color, CSF COLORLESS COLORLESS   Appearance, CSF CLEAR CLEAR   Supernatant NOT INDICATED    RBC Count, CSF 15 (H) 0 /cu mm   WBC, CSF 4 0 - 5 /cu mm   Other Cells, CSF TOO FEW TO COUNT, SMEAR AVAILABLE  FOR REVIEW     Comment: MANY LYMPHOCYTES Performed at [facility], [place] [address] [place], [place] [address]   Protein and glucose, CSF     Status: Abnormal   Collection Time: 08/10/19 11:56 PM  Result Value Ref Range   Glucose, CSF 49 40 - 70 mg/dL   Total  Protein, CSF 48 (H) 15 - 45 mg/dL    Comment: Performed at [facility] [address] [place], [place] [address]  Rheumatoid factor     Status: None   Collection Time: 08/11/19  2:34 AM  Result Value Ref Range   Rhuematoid fact SerPl-aCnc <10.0 0.0 - 13.9 IU/mL    Comment: (NOTE) Performed At: [facility] [place], [place] [id number] Rush Farmer MD [id number]   Vitamin B12     Status: None   Collection Time: 08/11/19  2:34 AM  Result Value Ref Range   Vitamin B-12 376 180 - 914 pg/mL    Comment: (NOTE) This assay is not validated for testing neonatal or myeloproliferative syndrome specimens for Vitamin B12 levels. Performed at [facility], [place] [address] [place], [place] [address]   TSH     Status: None   Collection Time: 08/11/19  2:34 AM  Result Value Ref Range   TSH 3.119 0.350 - 4.500 uIU/mL    Comment: Performed by a 3rd Generation assay with a functional sensitivity of <=0.01 uIU/mL. Performed at [facility], [place] [address] [place], [place] [address]   HIV Antibody (routine testing w rflx)     Status: None   Collection Time: 08/11/19  2:34 AM  Result Value Ref Range   HIV Screen 4th Generation wRfx Non Reactive Non Reactive    Comment: Performed at [facility], [place] [address], [place], [place] [address]   Comprehensive metabolic panel     Status: Abnormal   Collection Time: 08/11/19  2:34 AM  Result Value Ref Range   Sodium 137 135 - 145 mmol/L   Potassium 3.4 (L) 3.5 - 5.1 mmol/L   Chloride 103 98 - 111 mmol/L   CO2 22 22 - 32 mmol/L   Glucose, Bld 97 70 - 99 mg/dL    Comment: Glucose reference range applies only to samples taken after fasting for at least 8 hours.   BUN 14 8 - 23 mg/dL   Creatinine, Ser 1.01 (H) 0.44 - 1.00 mg/dL   Calcium 9.2 8.9 - 10.3 mg/dL   Total Protein 6.1 (L) 6.5 - 8.1 g/dL   Albumin 3.7 3.5 - 5.0 g/dL   AST 20 15 - 41 U/L   ALT 16 0 - 44 U/L   Alkaline Phosphatase 58 38 - 126 U/L   Total Bilirubin 0.8 0.3 - 1.2 mg/dL   GFR calc non Af Amer 58 (L) >60 mL/min   GFR calc Af Amer >60 >60 mL/min   Anion gap 12 5 - 15    Comment: Performed at [facility] [address], [place],  [address]  CBC     Status: Abnormal   Collection Time: 08/11/19  2:34 AM  Result Value Ref Range   WBC 7.4 4.0 - 10.5 K/uL   RBC 3.83 (L) 3.87 - 5.11 MIL/uL   Hemoglobin 11.7 (L) 12.0 - 15.0 g/dL   HCT 37.0 36 - 46 %   MCV 96.6 80.0 - 100.0 fL   MCH 30.5 26.0 - 34.0 pg   MCHC 31.6  30.0 - 36.0 g/dL   RDW 12.6 11.5 - 15.5 %   Platelets 176 150 - 400 K/uL    Comment: SPECIMEN CHECKED FOR CLOTS REPEATED TO VERIFY    nRBC 0.0 0.0 - 0.2 %    Comment: Performed at [facility], [place] [address] [place], [place] [address]  Magnesium     Status: None   Collection Time: 08/11/19  2:34 AM  Result Value Ref Range   Magnesium 1.8 1.7 - 2.4 mg/dL    Comment: Performed at [place] [address], [place], [place] [address]  Phosphorus     Status: None   Collection Time: 08/11/19  2:34 AM  Result Value Ref Range   Phosphorus 3.1 2.5 - 4.6 mg/dL    Comment: Performed at [facility] [address] [place], [place] [address]  Protime-INR     Status: None   Collection Time: 08/11/19  4:00 AM  Result Value Ref Range   Prothrombin Time 13.3 11.4 -  15.2 seconds   INR 1.1 0.8 - 1.2    Comment: (NOTE) INR goal varies based on device and disease states. Performed at [facility], [place] [address], [place], [place] [address]   APTT     Status: None   Collection Time: 08/11/19  4:00 AM  Result Value Ref Range   aPTT 27 24 - 36 seconds    Comment: Performed at [place] [address], [place], [place] [address]  Ceruloplasmin     Status: None   Collection Time: 08/11/19  7:22 AM  Result Value Ref Range   Ceruloplasmin 26.5 19.0 - 39.0 mg/dL    Comment: (NOTE) Performed At: [facility] [place], [place] [id number] Rush Farmer MD ON:[id number]     Recent Results (from the past 240 hour(s))  SARS Coronavirus 2 by RT PCR (hospital order, performed in [facility] hospital lab) Nasopharyngeal Nasopharyngeal Swab     Status: None   Collection Time: 08/10/19  3:43 PM   Specimen: Nasopharyngeal Swab  Result Value Ref Range Status   SARS Coronavirus 2 NEGATIVE NEGATIVE Final    Comment: (NOTE) SARS-CoV-2 target nucleic acids are NOT DETECTED.  The SARS-CoV-2 RNA is generally detectable in upper and lower respiratory specimens during the acute phase of infection. The lowest concentration of SARS-CoV-2 viral copies this assay can detect is 250 copies / mL. A negative result does not preclude SARS-CoV-2 infection and should not be used as the sole basis for treatment or other patient management decisions.  A negative result may occur with improper specimen collection / handling, submission of specimen other than nasopharyngeal swab, presence of viral mutation(s) within the areas targeted by this assay, and inadequate number of viral copies (<250 copies / mL). A negative result must be combined with clinical observations, patient history, and epidemiological information.  Fact Sheet for Patients:   StrictlyIdeas.no  Fact Sheet for Healthcare  Providers: BankingDealers.co.za  This test is not yet approved or  cleared by the [place] FDA and has been authorized for detection and/or diagnosis of SARS-CoV-2 by FDA under an Emergency Use Authorization (EUA).  This EUA will remain in effect (meaning this test can be used) for the duration of the COVID-19 declaration under Section 564(b)(1) of the Act, 21 U.S.C. section 360bbb-3(b)(1), unless the authorization is terminated or revoked sooner.  Performed at [facility], [address] [place], [place] [address]  CSF culture with Stat gram stain     Status: None (Preliminary result)   Collection Time: 08/10/19  9:46 PM   Specimen: CSF; Cerebrospinal Fluid  Result Value Ref Range Status   Specimen Description CSF  Final   Special Requests NONE  Final   Gram Stain   Final    WBC PRESENT,BOTH PMN AND MONONUCLEAR NO ORGANISMS SEEN CYTOSPIN SMEAR Performed at [facility], [place] [address] [place], [place] [address]    Culture PENDING  Incomplete   Report Status PENDING  Incomplete    Lipid Panel No results for input(s): CHOL, TRIG, HDL, CHOLHDL, VLDL, LDLCALC in the last 72 hours.  Studies/Results: MR BRAIN W WO CONTRAST  Result Date: 08/11/2019 CLINICAL DATA:  Difficulty with urination beginning about 1 week ago. Abnormal sensation of the lower extremities. EXAM: MRI HEAD WITHOUT AND WITH CONTRAST TECHNIQUE: Multiplanar, multiecho pulse sequences of the brain and surrounding structures were obtained without and with intravenous contrast. CONTRAST:  67mL GADAVIST GADOBUTROL 1 MMOL/ML IV SOLN COMPARISON:  Head CT 12/23/2015.  Spinal MRI studies done yesterday. FINDINGS: Brain: Diffusion imaging does not show any acute or subacute infarction or other cause of restricted diffusion. Brainstem and cerebellum are normal. Cerebral hemispheres show a 7 mm focus of T2 and FLAIR signal within the basal ganglia and radiating white matter tracts on  the left. Additionally, there is abnormal T2 and FLAIR signal at the base of the brain on the right affecting the inferior frontal and inferior medial temporal lobe. There is a 9 mm focus of contrast enhancement in that region. No restricted diffusion. No mass lesion, hemorrhage, hydrocephalus or extra-axial collection. Vascular: Major vessels at the base of the brain show flow. Skull and upper cervical spine: Negative Sinuses/Orbits: Clear/normal Other: None IMPRESSION: Abnormality at the inferior frontal lobe/medial temporal lobe on the right with a region of increased T2 and FLAIR signal and a 9 mm focus of contrast enhancement. Differential diagnosis is late subacute infarction versus demyelinating disease versus mass. Based on the spinal cord abnormalities shown yesterday, demyelinating disease is favored. A focus of cerebritis seems least likely. Neuro sarcoid is also being considered in the clinical differential diagnosis of the spinal disease, which could possibly be consistent with this brain lesion. 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 Electronically Signed   By: Nelson Chimes M.D.   On: 08/11/2019 12:11   MR THORACIC SPINE WO CONTRAST  Result Date: 08/10/2019 CLINICAL DATA:  Low back pain, lower extremity numbness, urinary problems EXAM: MRI THORACIC AND LUMBAR SPINE WITHOUT CONTRAST TECHNIQUE: Multiplanar and multiecho pulse sequences of the thoracic and lumbar spine were obtained without intravenous contrast. COMPARISON:  None. FINDINGS: MRI THORACIC SPINE Motion artifact is present Alignment:  Anteroposterior alignment is maintained. Vertebrae: Vertebral body heights are preserved. There is a probable vertebral body hemangioma at T4. There is no significant marrow edema. No suspicious osseous lesion. Cord: There is abnormal cord signal extending superiorly is from the T3-T4 level. Paraspinal and other soft tissues: Unremarkable. Disc levels:  Mild degenerative changes are present. For example, there are minor disc bulges at T2-T3 and T11-T12. There is mild to moderate facet arthropathy. No high-grade canal or neural foraminal narrowing identified. MRI LUMBAR SPINE Motion artifact is present. Segmentation: Counting from above, there are 6 lumbar type vertebral bodies. Alignment: There is grade 1 anterolisthesis at L3-L4. Mild retrolisthesis is present at L4-L5. Vertebrae: Vertebral body heights are maintained apart from degenerative endplate  irregularity primarily at L3-L4. Probable small hemangioma at L1. There is no significant marrow edema or suspicious osseous lesion. Conus medullaris and cauda equina: Conus extends to the L1-L2 level. Conus and cauda equina appear normal. Paraspinal and other soft tissues: Unremarkable. Disc levels: L1-L2:  No canal or foraminal stenosis. L2-L3:  Facet arthropathy.  No canal or foraminal stenosis L3-L4: Anterolisthesis with uncovering of disc bulge. Superimposed right foraminal protrusion. Facet arthropathy. No canal stenosis. Moderate right foraminal stenosis. No left foraminal stenosis. L4-L5: Disc bulge. Facet arthropathy. No canal or left foraminal stenosis. Mild right foraminal stenosis. L5-L6: Disc bulge eccentric to the left. Facet arthropathy. No canal or right foraminal stenosis. Mild to moderate left foraminal stenosis. L6-S1: No canal or foraminal stenosis. IMPRESSION: Motion artifact is present. Abnormal cord signal extending superiorly from the T3-T4 level into the cervical spine. Recommend contrast enhanced MRI of the cervical spine. Multilevel degenerative changes as detailed above. No high-grade canal stenosis. Right foraminal protrusion at L3-L4 causes moderate stenosis. These results were called by telephone at the time of interpretation on 08/10/2019 at 7:03 pm Electronically Signed   By: Macy Mis M.D.   On: 08/10/2019 19:07   MR LUMBAR SPINE WO CONTRAST  Result Date: 08/10/2019 CLINICAL  DATA:  Low back pain, lower extremity numbness, urinary problems EXAM: MRI THORACIC AND LUMBAR SPINE WITHOUT CONTRAST TECHNIQUE: Multiplanar and multiecho pulse sequences of the thoracic and lumbar spine were obtained without intravenous contrast. COMPARISON:  None. FINDINGS: MRI THORACIC SPINE Motion artifact is present Alignment:  Anteroposterior alignment is maintained. Vertebrae: Vertebral body heights are preserved. There is a probable vertebral body hemangioma at T4. There is no significant marrow edema. No suspicious osseous lesion. Cord: There is abnormal cord signal extending superiorly is from the T3-T4 level. Paraspinal and other soft tissues: Unremarkable. Disc levels: Mild degenerative changes are present. For example, there are minor disc bulges at T2-T3 and T11-T12. There is mild to moderate facet arthropathy. No high-grade canal or neural foraminal narrowing identified. MRI LUMBAR SPINE Motion artifact is present. Segmentation: Counting from above, there are 6 lumbar type vertebral bodies. Alignment: There is grade 1 anterolisthesis at L3-L4. Mild retrolisthesis is present at L4-L5. Vertebrae: Vertebral body heights are maintained apart from degenerative endplate irregularity primarily at L3-L4. Probable small hemangioma at L1. There is no significant marrow edema or suspicious osseous lesion. Conus medullaris and cauda equina: Conus extends to the L1-L2 level. Conus and cauda equina appear normal. Paraspinal and other soft tissues: Unremarkable. Disc levels: L1-L2:  No canal or foraminal stenosis. L2-L3:  Facet arthropathy.  No canal or foraminal stenosis L3-L4: Anterolisthesis with uncovering of disc bulge. Superimposed right foraminal protrusion. Facet arthropathy. No canal stenosis. Moderate right foraminal stenosis. No left foraminal stenosis. L4-L5: Disc bulge. Facet arthropathy. No canal or left foraminal stenosis. Mild right foraminal stenosis. L5-L6: Disc bulge eccentric to the left. Facet  arthropathy. No canal or right foraminal stenosis. Mild to moderate left foraminal stenosis. L6-S1: No canal or foraminal stenosis. IMPRESSION: Motion artifact is present. Abnormal cord signal extending superiorly from the T3-T4 level into the cervical spine. Recommend contrast enhanced MRI of the cervical spine. Multilevel degenerative changes as detailed above. No high-grade canal stenosis. Right foraminal protrusion at L3-L4 causes moderate stenosis. These results were called by telephone at the time of interpretation on 08/10/2019 at 7:03 pm Electronically Signed   By: Macy Mis M.D.   On: 08/10/2019 19:07   MR Cervical Spine W or Wo Contrast  Result Date: 08/10/2019  CLINICAL DATA:  Initial evaluation for abnormal cord signal intensity seen on prior MRI of the thoracic spine. EXAM: MRI CERVICAL SPINE WITHOUT AND WITH CONTRAST TECHNIQUE: Multiplanar and multiecho pulse sequences of the cervical spine, to include the craniocervical junction and cervicothoracic junction, were obtained without and with intravenous contrast. CONTRAST:  28mL GADAVIST GADOBUTROL 1 MMOL/ML IV SOLN COMPARISON:  Prior thoracic spine MRI from earlier the same day. FINDINGS: Alignment: Examination moderately degraded by motion artifact. Straightening of the normal cervical lordosis. 2 mm anterolisthesis of C3 on C4, with trace anterolisthesis of C4 on C5 and T1 on T2, chronic and degenerative. Vertebrae: Susceptibility artifact from prior ACDF seen at C5-C7 with solid arthrodesis. Vertebral body height maintained without evidence for acute or chronic fracture. Bone marrow signal intensity within normal limits. Benign hemangioma noted within the T1 vertebral body. No worrisome osseous lesions. No abnormal marrow edema or enhancement. Cord: Abnormal diffuse T2/STIR signal intensity seen throughout the mid and lower cervical spinal cord, extending from the level of C4 through T3-4 as seen on corresponding thoracic spine MRI. Signal  intensity is fairly diffuse and primarily central in location, with associated abnormal cord expansion, suggesting edema. Irregular eccentric patchy post-contrast enhancement seen throughout the areas affected (series 12, images 8, 9 on sagittal sequence, series 13, images 35, 27, 23 on axial sequence). Finding most suggestive of acute transverse myelitis. Superiorly, the spinal cord is relatively normal in appearance to the cervicomedullary junction. Posterior Fossa, vertebral arteries, paraspinal tissues: Visualized brain and posterior fossa are grossly within normal limits on this motion degraded exam. Craniocervical junction normal. Paraspinous and prevertebral soft tissues within normal limits. Normal flow voids seen within the vertebral arteries bilaterally. Disc levels: C2-C3: Minimal disc bulge. Left-sided facet hypertrophy. No significant canal or foraminal stenosis. C3-C4: Anterolisthesis. Mild disc bulge with uncovertebral hypertrophy. Superimposed bilateral facet degeneration. No significant spinal stenosis. Moderate right worse than left C4 foraminal stenosis. C4-C5: Anterolisthesis. Mild disc bulge with uncovertebral hypertrophy. Bilateral facet degeneration. No significant spinal stenosis. Moderate bilateral C5 foraminal stenosis. C5-C6: Prior fusion. No residual spinal stenosis. Foramina are grossly patent. C6-C7: Prior fusion. No significant residual spinal stenosis. Foramina are grossly patent. C7-T1: Minimal disc bulge. Right greater than left facet hypertrophy. No significant canal or foraminal stenosis. IMPRESSION: 1. Abnormal T2/STIR signal intensity throughout the mid and lower cervical spinal cord, extending from C4 through T3-4, with associated patchy post-contrast enhancement. 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 2. Prior ACDF at C5-C7 without residual spinal stenosis. 3. Mild degenerative spondylosis  and facet degeneration at C3-4 and C4-5 with resultant moderate bilateral C4 and C5 foraminal stenosis. Electronically Signed   By: Jeannine Boga M.D.   On: 08/10/2019 20:29   DG Lumbar Puncture Fluoro Guide  Result Date: 08/11/2019 CLINICAL DATA:  Transverse myelitis EXAM: DIAGNOSTIC LUMBAR PUNCTURE UNDER FLUOROSCOPIC GUIDANCE FLUOROSCOPY TIME:  Fluoroscopy Time:  30 seconds Radiation Exposure Index (if provided by the fluoroscopic device): Number of Acquired Spot Images: 0 PROCEDURE: Informed consent was obtained from the patient prior to the procedure, including potential complications of headache, allergy, and pain. With the patient prone, the lower back was prepped with Betadine. 1% Lidocaine was used for local anesthesia. Lumbar puncture was performed at the L4-5 level using a 20 gauge needle with return of clear CSF with an opening pressure of 20 cm water. Ten ml of CSF were obtained for laboratory studies. The patient tolerated the procedure well and there were  no apparent complications. IMPRESSION: Successful lumbar puncture under fluoroscopic guidance. Electronically Signed   By: Rolm Baptise M.D.   On: 08/11/2019 00:06    Medications:  Scheduled: . carvedilol  12.5 mg Oral BID  . citalopram  20 mg Oral QHS  . lubiprostone  24 mcg Oral BID  . pantoprazole sodium  40 mg Oral Daily  . pramipexole  1.5 mg Oral QHS  . senna-docusate  1 tablet Oral BID   Continuous: . methylPREDNISolone (SOLU-MEDROL) injection Stopped (08/11/19 1247)    Laurey Morale, MSN, NP-C [facility] [phone]  Assessment: 64 year old female with a one week history of progressive urinary retention, lower extremity numbness and weakness 1. MRI of cervical and thoracic spine with and without contrast reveals findings suspicious for neurosarcoidosis, consisting of regions of posterior spinal cord avid enhancement in conjunction with longitudinally extensive inflammatory myelitis. Posterior  spinal cord ischemic infarction is felt to be unlikely based on the imaging appearance. CSF results are not suggestive of an infectious etiology, given normal white count and mildly elevated protein.  2. MRI brain completed, revealing a significant abnormality at the inferior frontal lobe/medial temporal lobe on the right with a region of increased T2 and FLAIR signal and a 9 mm focus of contrast enhancement. Differential diagnosis is late subacute infarction versus demyelinating disease versus mass. Based on the spinal cord abnormalities shown yesterday, demyelinating disease is favored. A focus of cerebritis seems least likely. Neuro sarcoid is also being considered in the clinical differential diagnosis of the spinal disease, which could possibly be consistent with this brain lesion. 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 3. Exam today shows sensation decreased on BLE, but R is better than left.  Recommendations: 1. On IV methylprednisolone, 1000 mg qd x 5 days. On day 2/5 2. Serum copper level pending. 3. Vitamin B12 level is normal at 376, somewhat at the low end of the normal range. Methylmalonic acid and homocysteine levels to further assess the significance of a possibly clinically relevant low-range B12 level are pending. 4. NMO Ab pending.  5. Angiotensin-converting enzyme level is pending. 6. Other tests that we have recommended include: ANA, rheumatoid factor, SSA, SSB, double-stranded DNA, B12, TSH.  7. Flow cytometry and cytology were added to the CSF labs yesterday  Addendum 08/13/19:  Relatively low on the DDx but possible is neurosyphilis as well as tuberculosis given the imaging findings. CSF VDRL on the existing CSF sample as well as CSF AFB stain and skin PPD test have been ordered.     LOS: 1 day   @Electronically  signed: Dr. Kerney Elbe 08/12/2019  8:53 AM

## 2019-08-13 LAB — CBC
HCT: 35.3 % — ABNORMAL LOW (ref 36.0–46.0)
Hemoglobin: 11.8 g/dL — ABNORMAL LOW (ref 12.0–15.0)
MCH: 30.2 pg (ref 26.0–34.0)
MCHC: 33.4 g/dL (ref 30.0–36.0)
MCV: 90.3 fL (ref 80.0–100.0)
Platelets: 251 10*3/uL (ref 150–400)
RBC: 3.91 MIL/uL (ref 3.87–5.11)
RDW: 12.6 % (ref 11.5–15.5)
WBC: 11.1 10*3/uL — ABNORMAL HIGH (ref 4.0–10.5)
nRBC: 0 % (ref 0.0–0.2)

## 2019-08-13 LAB — BASIC METABOLIC PANEL
Anion gap: 10 (ref 5–15)
BUN: 21 mg/dL (ref 8–23)
CO2: 25 mmol/L (ref 22–32)
Calcium: 9.2 mg/dL (ref 8.9–10.3)
Chloride: 101 mmol/L (ref 98–111)
Creatinine, Ser: 1 mg/dL (ref 0.44–1.00)
GFR calc Af Amer: >60 mL/min (ref >60)
GFR calc non Af Amer: 59 mL/min — ABNORMAL LOW (ref >60)
Glucose, Bld: 170 mg/dL — ABNORMAL HIGH (ref 70–99)
Potassium: 3.4 mmol/L — ABNORMAL LOW (ref 3.5–5.1)
Sodium: 136 mmol/L (ref 135–145)

## 2019-08-13 LAB — HSV DNA BY PCR (REFERENCE LAB)
HSV 1 DNA: NEGATIVE
HSV 2 DNA: NEGATIVE

## 2019-08-13 MED ORDER — TUBERCULIN PPD 5 UNIT/0.1ML ID SOLN
5.0000 [IU] | Freq: Once | INTRADERMAL | Status: AC
  Administered 2019-08-13: 5 [IU] via INTRADERMAL
  Filled 2019-08-13: qty 0.1

## 2019-08-13 NOTE — Progress Notes (Signed)
[facility] Progress Note  Patient: Connie Haynes    XVQ:[id number]  DOA: 08/10/2019     Date of Service: the patient was seen and examined on 08/13/2019  Brief hospital course: Connie Haynes is a 64 y.o. 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 CSF Prelim results not suggestive of infectious etiology. Not convincing for inflammatory etiology either. Per neuro an longitudinally extensive inflammatory myelitis is the most likely etiology for the patient's dorsal spinal cord lesion with patchy enhancement on MRI.   Currently plan is continue IV steroids.  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   Neurologist was consulted by ED team, spinal tap was done, ACE levels,ANA, RF,SSA, SSB,B12, DsDNA, neuromyelitis optica autoantibody IgG and anti-Jo 1 antibody were ordered. Majority of the results available right now are negative. Neurologist consult appreciated No indication for antibiotics Neuro started the pt on high dose 1gm IV solumedrol x 5 days. MRI Brain shows right sided inferior frontal lobe/medial temporal lobe increased T2 and FLAIR signal and a 9 mm focus of contrast enhancement suggestive of subacute infarction versus demyelinating disease versus mass. But based on the spinal cord abnormalities shown yesterday, demyelinating disease is favored.  Continue fall precaution and neuro  checks Continue PT/OT eval and treat  Essential hypertension Continue Coreg  GERD Continue Protonix  Adrenal insufficiency Hold home hydrocortisone while on IV steroids.  More likely she will be on slow taper so holding cortef until the pt is back to equivalent home regimen of steroids will be the most sensible approach.   Morbid obesity Check weight.  Outpatient weight 122kg.   Anxiety and tremors Due to steroids  Add ativan Continue celexa  HLD  Continue statin  H/o CVA No residual deficit  Monitor   OSA Restless leg syndrome Continue pramipexole home regimen  CKD 2 Follows up with nephrology as outpatient  Stable  Diet: cardiac diet DVT Prophylaxis: Subcutaneous Lovenox  SCDs Start: 08/11/19 0215    Advance goals of care discussion: Full code  Family Communication: no family was present at bedside, at the time of interview.   Disposition:  Status is: Inpatient  Remains inpatient appropriate because:IV treatments appropriate due to intensity of illness or inability to take PO   Dispo: The patient is from: Home              Anticipated d/c is to: Home              Anticipated d/c date is: > 3 days              Patient currently is not medically stable to d/c.  Subjective: Resolved tremors, sleeping okay.  Feeling better.  No nausea no vomiting. Physical Exam:  General: Appear in mild distress, no Rash; Oral Mucosa Clear, moist. no Abnormal Neck Mass Or lumps, Conjunctiva normal  Cardiovascular: S1 and S2 Present, no Murmur, Respiratory: good respiratory effort, Bilateral Air entry present and Clear to Auscultation, no Crackles, no wheezes Abdomen: Bowel Sound [present, Soft and nop tenderness  Extremities: no Pedal edema, no calf tenderness Neurology: alert and oriented to time, place, and person affect appropriate. no new focal deficit Gait not checked due to patient safety concerns  Vitals:   08/12/19 2326 08/12/19 2348 08/13/19 0450  08/13/19 1250  BP:  (!) 150/85 (!) 153/76 133/81  Pulse:  68 65 70  Resp:  18 16 18   Temp:  97.6 F (36.4 C) 97.8 F (36.6 C) 97.9 F (36.6 C)  TempSrc:  Oral Oral Oral  SpO2: 95% 93% 96% 92%    Intake/Output Summary (Last 24 hours) at 08/13/2019 1734 Last data filed at 08/13/2019 0452 Gross per 24 hour  Intake 180 ml  Output --  Net 180 ml   There were no vitals filed for this visit.  Data Reviewed: I have personally reviewed and interpreted daily labs, tele strips, imagings as discussed above. I reviewed all nursing notes, pharmacy notes, vitals, pertinent old records I have discussed plan of care as described above with RN and patient/family.  CBC: Recent Labs  Lab 08/10/19 1543 08/11/19 0234 08/13/19 0232  WBC 8.9 7.4 11.1*  NEUTROABS 6.1  --   --   HGB 12.5 11.7* 11.8*  HCT 37.9 37.0 35.3*  MCV 93.1 96.6 90.3  PLT 283 176 585   Basic Metabolic Panel: Recent Labs  Lab 08/10/19 1543 08/11/19 0234 08/13/19 0232  NA 136 137 136  K 3.4* 3.4* 3.4*  CL 100 103 101  CO2 26 22 25   GLUCOSE 107* 97 170*  BUN 19 14 21   CREATININE 0.99 1.01* 1.00  CALCIUM 9.2 9.2 9.2  MG  --  1.8  --   PHOS  --  3.1  --     Studies: No results found.  Scheduled Meds: . carvedilol  12.5 mg Oral BID  . citalopram  20 mg Oral QHS  . lubiprostone  24 mcg Oral BID  . pantoprazole sodium  40 mg Oral Daily  . pramipexole  1.5 mg Oral QHS  . senna-docusate  1 tablet Oral BID   Continuous Infusions: . methylPREDNISolone (SOLU-MEDROL) injection 1,000 mg (08/13/19 0812)   PRN Meds: acetaminophen, LORazepam, ondansetron (ZOFRAN) IV, oxyCODONE, tiZANidine  Time spent: 35 minutes  Author: Berle Mull, MD [facility] 08/13/2019 5:34 PM  To reach On-call, see care teams to locate the attending and reach out via [email or web address]. Between 7PM-7AM, please contact night-coverage If you still have difficulty reaching the attending provider, please page the [facility] (Director on Call) for  [facility] on amion for assistance.

## 2019-08-13 NOTE — Plan of Care (Signed)
  Problem: Clinical Measurements: Goal: Ability to maintain clinical measurements within normal limits will improve Outcome: Progressing   Problem: Nutrition: Goal: Adequate nutrition will be maintained Outcome: Progressing   

## 2019-08-14 LAB — CSF CULTURE W GRAM STAIN: Culture: NO GROWTH

## 2019-08-14 LAB — BASIC METABOLIC PANEL
Anion gap: 11 (ref 5–15)
BUN: 25 mg/dL — ABNORMAL HIGH (ref 8–23)
CO2: 24 mmol/L (ref 22–32)
Calcium: 9.1 mg/dL (ref 8.9–10.3)
Chloride: 102 mmol/L (ref 98–111)
Creatinine, Ser: 1.02 mg/dL — ABNORMAL HIGH (ref 0.44–1.00)
GFR calc Af Amer: >60 mL/min (ref >60)
GFR calc non Af Amer: 57 mL/min — ABNORMAL LOW (ref >60)
Glucose, Bld: 139 mg/dL — ABNORMAL HIGH (ref 70–99)
Potassium: 3.3 mmol/L — ABNORMAL LOW (ref 3.5–5.1)
Sodium: 137 mmol/L (ref 135–145)

## 2019-08-14 LAB — CBC
HCT: 34.5 % — ABNORMAL LOW (ref 36.0–46.0)
Hemoglobin: 11.4 g/dL — ABNORMAL LOW (ref 12.0–15.0)
MCH: 29.8 pg (ref 26.0–34.0)
MCHC: 33 g/dL (ref 30.0–36.0)
MCV: 90.3 fL (ref 80.0–100.0)
Platelets: 263 10*3/uL (ref 150–400)
RBC: 3.82 MIL/uL — ABNORMAL LOW (ref 3.87–5.11)
RDW: 12.6 % (ref 11.5–15.5)
WBC: 11.5 10*3/uL — ABNORMAL HIGH (ref 4.0–10.5)
nRBC: 0 % (ref 0.0–0.2)

## 2019-08-14 LAB — RPR: RPR Ser Ql: NONREACTIVE

## 2019-08-14 MED ORDER — ENOXAPARIN SODIUM 40 MG/0.4ML ~~LOC~~ SOLN
40.0000 mg | SUBCUTANEOUS | Status: DC
  Administered 2019-08-14 – 2019-08-17 (×4): 40 mg via SUBCUTANEOUS
  Filled 2019-08-14 (×4): qty 0.4

## 2019-08-14 MED ORDER — POTASSIUM CHLORIDE CRYS ER 20 MEQ PO TBCR
40.0000 meq | EXTENDED_RELEASE_TABLET | Freq: Once | ORAL | Status: AC
  Administered 2019-08-14: 40 meq via ORAL
  Filled 2019-08-14: qty 2

## 2019-08-14 NOTE — Progress Notes (Signed)
Pulse oximetry placed on patient for overnight observation study. Will pick in the morning for download.

## 2019-08-14 NOTE — Progress Notes (Signed)
[facility] Progress Note  Patient: Connie Haynes    IWL:[id number]  DOA: 08/10/2019     Date of Service: the patient was seen and examined on 08/14/2019  Brief hospital course: Connie Haynes is a 64 y.o. 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 CSF Prelim results not suggestive of infectious etiology. Not convincing for inflammatory etiology either. Per neuro an longitudinally extensive inflammatory myelitis is the most likely etiology for the patient's dorsal spinal cord lesion with patchy enhancement on MRI.   Currently plan is continue IV steroids.  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   Neurologist was consulted by ED team, spinal tap was done, ACE levels,ANA, RF,SSA, SSB,B12, DsDNA, neuromyelitis optica autoantibody IgG and anti-Jo 1 antibody were ordered. Majority of the results available right now are negative. Neurologist consult appreciated No indication for antibiotics Neuro started the pt on high dose 1gm IV solumedrol x 5 days. MRI Brain shows right sided inferior frontal lobe/medial temporal lobe increased T2 and FLAIR signal and a 9 mm focus of contrast enhancement suggestive of subacute infarction versus demyelinating disease versus mass. But based on the spinal cord abnormalities shown yesterday, demyelinating disease is favored.  Continue fall precaution and neuro  checks Continue PT/OT eval recommends home with home health.    Essential hypertension Continue Coreg  GERD Continue Protonix  Adrenal insufficiency Hold home hydrocortisone while on IV steroids.  More likely she will be on slow taper so holding cortef until the pt is back to equivalent home regimen of steroids will be the most sensible approach.   Morbid obesity Check weight.  Outpatient weight 122kg.   Anxiety and tremors Due to steroids  Add ativan Continue celexa  HLD  Continue statin  H/o CVA No residual deficit  Monitor   OSA Restless leg syndrome Continue pramipexole home regimen Discussed with RT about overnight oximetry.  They will perform on 08/14/2019.  CKD 2 Follows up with nephrology as outpatient  Stable  Diet: cardiac diet DVT Prophylaxis: Subcutaneous Lovenox  SCDs Start: 08/11/19 0215    Advance goals of care discussion: Full code  Family Communication: no family was present at bedside, at the time of interview.   Disposition:  Status is: Inpatient  Remains inpatient appropriate because:IV treatments appropriate due to intensity of illness or inability to take PO   Dispo: The patient is from: Home              Anticipated d/c is to: Home              Anticipated d/c date is: > 3 days              Patient currently is not medically stable to d/c.  Subjective: No nausea no vomiting.  No fever no chills no chest pain no abdominal pain.  Physical Exam:  General: Appear in mild distress, no Rash; Oral Mucosa Clear, moist. no Abnormal Neck Mass Or lumps, Conjunctiva normal  Cardiovascular: S1 and S2 Present, no Murmur, Respiratory: good respiratory  effort, Bilateral Air entry present and Clear to Auscultation, no Crackles, no wheezes Abdomen: Bowel Sound [present, Soft and nop tenderness Extremities: no Pedal edema, no calf tenderness Neurology: alert and oriented to time, place, and person affect appropriate. no new focal deficit Gait  not checked due to patient safety concerns  Vitals:   08/13/19 2336 08/14/19 0512 08/14/19 1206 08/14/19 1812  BP: (!) 148/66 (!) 158/72 (!) 91/54 (!) 143/67  Pulse: 60 65 63 (!) 52  Resp: 18 18 16 16   Temp: 97.9 F (36.6 C) 98 F (36.7 C) 97.9 F (36.6 C) 97.9 F (36.6 C)  TempSrc: Oral Oral Oral Oral  SpO2: 96% 97% 93% 95%    Intake/Output Summary (Last 24 hours) at 08/14/2019 1903 Last data filed at 08/14/2019 1233 Gross per 24 hour  Intake 300 ml  Output --  Net 300 ml   There were no vitals filed for this visit.  Data Reviewed: I have personally reviewed and interpreted daily labs, tele strips, imagings as discussed above. I reviewed all nursing notes, pharmacy notes, vitals, pertinent old records I have discussed plan of care as described above with RN and patient/family.  CBC: Recent Labs  Lab 08/10/19 1543 08/11/19 0234 08/13/19 0232 08/14/19 0556  WBC 8.9 7.4 11.1* 11.5*  NEUTROABS 6.1  --   --   --   HGB 12.5 11.7* 11.8* 11.4*  HCT 37.9 37.0 35.3* 34.5*  MCV 93.1 96.6 90.3 90.3  PLT 283 176 251 740   Basic Metabolic Panel: Recent Labs  Lab 08/10/19 1543 08/11/19 0234 08/13/19 0232 08/14/19 0556  NA 136 137 136 137  K 3.4* 3.4* 3.4* 3.3*  CL 100 103 101 102  CO2 26 22 25 24   GLUCOSE 107* 97 170* 139*  BUN 19 14 21  25*  CREATININE 0.99 1.01* 1.00 1.02*  CALCIUM 9.2 9.2 9.2 9.1  MG  --  1.8  --   --   PHOS  --  3.1  --   --     Studies: No results found.  Scheduled Meds: . carvedilol  12.5 mg Oral BID  . citalopram  20 mg Oral QHS  . lubiprostone  24 mcg Oral BID  . pantoprazole sodium  40 mg Oral Daily  . pramipexole  1.5 mg Oral QHS  . senna-docusate  1 tablet Oral BID  . tuberculin  5 Units Intradermal Once   Continuous Infusions: . methylPREDNISolone (SOLU-MEDROL) injection 1,000 mg (08/14/19 1000)   PRN Meds: acetaminophen, LORazepam, ondansetron (ZOFRAN) IV, oxyCODONE, tiZANidine  Time spent: 35 minutes  Author: Berle Mull,  MD [facility] 08/14/2019 7:03 PM  To reach On-call, see care teams to locate the attending and reach out via [email or web address]. Between 7PM-7AM, please contact night-coverage If you still have difficulty reaching the attending provider, please page the [facility] (Director on Call) for [facility] on amion for assistance.

## 2019-08-14 NOTE — TOC Initial Note (Signed)
Transition of Care [facility]) - Initial/Assessment Note    Patient Details  Name: Connie Haynes MRN: [id number] Date of Birth: January 06, 1956  [facility]) CM/SW Contact:    Marilu Favre, RN Phone Number: 08/14/2019, 2:27 PM  Clinical Narrative:                 Spoke to patient and husband at bedside.   Patient from home with husband. Confirmed face sheet information. Patient does not use any DME.   Patient already having OP PT at : [facility] at [address] [place], [place] [address] Phone [phone] she sees [facility]. Called same they are closed for July 4 holiday.   Expected Discharge Plan: Home/Self Care Barriers to Discharge: Continued Medical Work up   Patient Goals and CMS Choice Patient states their goals for this hospitalization and ongoing recovery are:: to return to home CMS Medicare.gov Compare Post Acute Care list provided to:: Patient Choice offered to / list presented to : Patient  Expected Discharge Plan and Services Expected Discharge Plan: Home/Self Care   Discharge Planning Services: CM Consult   Living arrangements for the past 2 months: Single Family Home                 DME Arranged: N/A DME Agency: NA       HH Arranged: NA          Prior Living Arrangements/Services Living arrangements for the past 2 months: Single Family Home Lives with:: Spouse Patient language and need for interpreter reviewed:: Yes Do you feel safe going back to the place where you live?: Yes      Need for Family Participation in Patient Care: Yes (Comment) Care giver support system in place?: Yes (comment)   Criminal Activity/Legal Involvement Pertinent to Current Situation/Hospitalization: No - Comment as needed  Activities of Daily Living      Permission Sought/Granted   Permission granted to share information with : Yes, Verbal Permission Granted  Share Information with NAME: husband Logyn Dedominicis  [phone]           Emotional Assessment Appearance:: Appears stated age Attitude/Demeanor/Rapport: Engaged Affect (typically observed): Accepting Orientation: : Oriented to Self, Oriented to Place, Oriented to  Time, Oriented to Situation Alcohol / Substance Use: Not Applicable Psych Involvement: No (comment)  Admission diagnosis:  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 Adrenal insufficiency (Addison's disease) ([place]) 08/11/2019  . S/P left TKA 08/25/2016  . S/P total knee replacement 08/25/2016  . S/P shoulder replacement 06/29/2014  . OSA (obstructive sleep apnea) 01/22/2014  . Left ureteral stone 12/18/2013  . UTI (urinary tract infection) 12/18/2013  . Morbid obesity ([place]) 08/09/2013  . S/P right TKA 08/08/2013  . Chronic pain syndrome 03/04/2013  . Depressive disorder, not elsewhere classified 03/04/2013  . Need for prophylactic vaccination and inoculation against influenza 10/04/2012  . Erosive osteoarthritis of multiple sites 10/04/2012  . Chronic cough 10/04/2012  . Swelling of limb 08/21/2012  . Essential hypertension, benign 08/21/2012  . Preoperative general physical examination 06/12/2012  . Restless leg syndrome 06/12/2012  . Anxiety state, unspecified 06/12/2012  . Lung nodule 07/16/2011  . Cough 04/21/2011  . Wolff-Parkinson-White (WPW) syndrome   . Osteoarthritis   . Hyperlipidemia   . IBS (irritable bowel syndrome)   .  Kidney stones    PCP:  [name], MD Pharmacy:   [facility], [address] [place] [address] Phone: [phone] Fax: [phone]     Social Determinants of Health (SDOH) Interventions    Readmission Risk Interventions No flowsheet data found.

## 2019-08-14 NOTE — Progress Notes (Signed)
Occupational Therapy Treatment Patient Details Name: Connie Haynes MRN: [id number] DOB: 25-Sep-1955 Today's Date: 08/14/2019    History of present illness Connie Haynes is a 64 y.o. 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  Undergoing further work up for transverse myelitis versus other deficiencies.   OT comments  Pt progressing with OT goals. On OT entry, pt completing bathing tasks standing at sink without assistance and reported minimal difficulty. Pt with desire to wash hair - OT assisted in washing hair standing at sink with pt brushing hair without physical assistance to maintain standing at sink. Pt min guard for short distance mobility in room with RW to ensure safety due to continued sensation impairment in B LE. Collaborated with pt and educated on strategies to implement at home to maximize independence with pt verbalizing understanding. Updating DC recommendations to no OT follow up after performance during today's session. Will continue to assess and follow acutely.   Follow Up Recommendations  No OT follow up;Supervision - Intermittent    Equipment Recommendations  None recommended by OT    Recommendations for Other Services      Precautions / Restrictions Precautions Precautions: Fall Precaution Comments: decreased LE sensation Restrictions Weight Bearing Restrictions: No       Mobility Bed Mobility Overal bed mobility: Modified Independent Bed Mobility: Supine to Sit     Supine to sit: Modified independent (Device/Increase time)     General bed mobility comments: Modified Independent to sit EOB  Transfers Overall transfer level: Needs  assistance Equipment used: Rolling walker (2 wheeled) Transfers: Sit to/from [facility] Sit to Stand: Modified independent (Device/Increase time) Stand pivot transfers: Supervision       General transfer comment: Overall supervision for stand pivot, Modified Independent for sit to stand with RW. min guard for mobility to ensure B foot clearance and sequencing safely    Balance Overall balance assessment: Needs assistance Sitting-balance support: Feet supported Sitting balance-Leahy Scale: Normal     Standing balance support: No upper extremity supported;During functional activity;Bilateral upper extremity supported Standing balance-Leahy Scale: Fair Standing balance comment: static standing balance without UE support fair, pt reports needing RW to feel more stable on feet due to decreased sensation                           ADL either performed or assessed with clinical judgement   ADL Overall ADL's : Needs assistance/impaired     Grooming: Supervision/safety;Standing;Brushing hair Grooming Details (indicate cue type and reason): Min guard progressing to supervision standing at sink to wash hair and brush hair.              Lower Body Dressing: Minimal assistance;Sit to/from stand Lower Body Dressing Details (indicate cue type and reason): Min A for LB dressing to don R sock. Pt reports has used sock aid in the past and plans to buy another to maximize independence             Functional mobility during ADLs: Min guard;Rolling walker General ADL Comments: Pt min guard for short distance mobility in room with RW, slow careful pace noted.      Vision   Vision Assessment?:  No apparent visual deficits   Perception     Praxis      Cognition Arousal/Alertness: Awake/alert Behavior During Therapy: WFL for tasks assessed/performed;Anxious Overall Cognitive Status: Within Functional Limits for tasks assessed                                           Exercises     Shoulder Instructions       General Comments Extended time spent conversing on PLOF, DME, routine with pt self aware of deficits and need for DME use. Pt interested in obtaining sock aid - provided education on different types and locations     Pertinent Vitals/ Pain       Pain Assessment: No/denies pain  Home Living                                          Prior Functioning/Environment              Frequency  Min 2X/week        Progress Toward Goals  OT Goals(current goals can now be found in the care plan section)  Progress towards OT goals: Progressing toward goals  Acute Rehab OT Goals Patient Stated Goal: to return to normal; to be able to go hiking again (to see a waterfall) OT Goal Formulation: With patient Time For Goal Achievement: 08/26/19 Potential to Achieve Goals: Good ADL Goals Pt Will Perform Grooming: with modified independence;standing Pt Will Perform Lower Body Bathing: with modified independence;sitting/lateral leans;sit to/from stand Pt Will Perform Lower Body Dressing: with modified independence;sit to/from stand;sitting/lateral leans Pt Will Transfer to Toilet: with modified independence;ambulating Pt Will Perform Toileting - Clothing Manipulation and hygiene: with modified independence;sit to/from stand;sitting/lateral leans Pt Will Perform Tub/Shower Transfer: Shower transfer;ambulating;3 in 1;with supervision  Plan Discharge plan needs to be updated    Co-evaluation                 AM-PAC OT "6 Clicks" Daily Activity     Outcome Measure   Help from another person eating meals?: None Help from another person taking care of personal grooming?: A Little Help from another person toileting, which includes using toliet, bedpan, or urinal?: None Help from another person bathing (including washing, rinsing, drying)?: A Little Help from another person to put on and taking off  regular upper body clothing?: None Help from another person to put on and taking off regular lower body clothing?: A Little 6 Click Score: 21    End of Session Equipment Utilized During Treatment: Rolling walker  OT Visit Diagnosis: Unsteadiness on feet (R26.81);Other abnormalities of gait and mobility (R26.89)   Activity Tolerance Patient tolerated treatment well   Patient Left with call bell/phone within reach  Nurse Communication Mobility status        Time: 2841-3244 OT Time Calculation (min): 32 min  Charges: OT General Charges $OT Visit: 1 Visit OT Treatments $Self Care/Home Management : 23-37 mins  Layla Maw, OTR/L   Layla Maw 08/14/2019, 11:04 AM

## 2019-08-14 NOTE — Progress Notes (Signed)
Physical Therapy Treatment Patient Details Name: Connie Haynes MRN: [id number] DOB: 05/09/1955 Today's Date: 08/14/2019    History of Present Illness TAL NEER is a 64 y.o. 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  Undergoing further work up for transverse myelitis versus other deficiencies.    PT Comments    Continuing work on functional mobility and activity tolerance;  Able to modestly incr amb distance in hallway with RW; noting that her anxiety increases with fatigue; Worked on weight sift fully into stance to help with stepping LE advancement in multidirections; Modest progress   Follow Up Recommendations  Outpatient PT     Equipment Recommendations  None recommended by PT    Recommendations for Other Services       Precautions / Restrictions Precautions Precautions: Fall Precaution Comments: decreased LE sensation    Mobility  Bed Mobility                  Transfers Overall transfer level: Needs assistance Equipment used: Rolling walker (2 wheeled) Transfers: Sit to/from Stand Sit to Stand: Supervision         General transfer comment: Cues for hand placement and safety  Ambulation/Gait Ambulation/Gait assistance: Min guard;Min assist Gait Distance (Feet): 100 Feet Assistive device: Rolling walker (2 wheeled) Gait Pattern/deviations: Step-to pattern;Step-through pattern;Decreased stride length;Wide base of support     General Gait Details: some difficulty with placement of feet, having to readjust them based on placement initially; difficulty with turns due to feeling feet go forward better than to side so scooting feet or pivoting  them more for turns; at times walker too far out and had to readjust   Stairs             Wheelchair Mobility    Modified Rankin (Stroke Patients Only)       Balance Overall balance assessment: Needs assistance Sitting-balance support: Feet supported Sitting balance-Leahy Scale: Normal     Standing balance support: No upper extremity supported;During functional activity;Bilateral upper extremity supported Standing balance-Leahy Scale: Fair Standing balance comment: static standing balance without UE support fair, pt reports needing RW to feel more stable on feet due to decreased sensation                            Cognition Arousal/Alertness: Awake/alert Behavior During Therapy: WFL for tasks assessed/performed;Anxious Overall Cognitive Status: Within Functional Limits for tasks assessed                                        Exercises      General Comments        Pertinent Vitals/Pain Pain Assessment: No/denies pain    Home Living                      Prior Function            PT Goals (current goals can now be found in the care plan section) Acute Rehab PT Goals Patient Stated Goal: to return to normal; to be able to go hiking again (to see  a waterfall) PT Goal Formulation: With patient Time For Goal Achievement: 08/25/19 Potential to Achieve Goals: Good Progress towards PT goals: Progressing toward goals    Frequency    Min 3X/week      PT Plan Current plan remains appropriate    Co-evaluation              AM-PAC PT "6 Clicks" Mobility   Outcome Measure  Help needed turning from your back to your side while in a flat bed without using bedrails?: None Help needed moving from lying on your back to sitting on the side of a flat bed without using bedrails?: None Help needed moving to and from a bed to a chair (including a wheelchair)?: A Little Help needed standing up from a chair using your arms  (e.g., wheelchair or bedside chair)?: A Little Help needed to walk in hospital room?: A Little Help needed climbing 3-5 steps with a railing? : A Little 6 Click Score: 20    End of Session Equipment Utilized During Treatment: Gait belt Activity Tolerance: Patient tolerated treatment well;Patient limited by fatigue Patient left: in bed;with call bell/phone within reach;with family/visitor present Nurse Communication: Mobility status PT Visit Diagnosis: Other abnormalities of gait and mobility (R26.89);Difficulty in walking, not elsewhere classified (R26.2);Other symptoms and signs involving the nervous system (R29.898)     Time: 1546-1610 PT Time Calculation (min) (ACUTE ONLY): 24 min  Charges:  $Gait Training: 23-37 mins                     Roney Marion, [place]  Acute Rehabilitation Services Pager [phone] Office [phone]    Colletta [place] 08/14/2019, 8:01 PM

## 2019-08-15 ENCOUNTER — Inpatient Hospital Stay (HOSPITAL_COMMUNITY): Payer: Medicare Other

## 2019-08-15 LAB — CBC
HCT: 39.1 % (ref 36.0–46.0)
Hemoglobin: 13 g/dL (ref 12.0–15.0)
MCH: 30.1 pg (ref 26.0–34.0)
MCHC: 33.2 g/dL (ref 30.0–36.0)
MCV: 90.5 fL (ref 80.0–100.0)
Platelets: 292 10*3/uL (ref 150–400)
RBC: 4.32 MIL/uL (ref 3.87–5.11)
RDW: 12.5 % (ref 11.5–15.5)
WBC: 10.7 10*3/uL — ABNORMAL HIGH (ref 4.0–10.5)
nRBC: 0 % (ref 0.0–0.2)

## 2019-08-15 LAB — IGG CSF INDEX
Albumin CSF-mCnc: 33 mg/dL (ref 8–37)
Albumin: 4 g/dL (ref 3.8–4.8)
CSF IgG Index: 0.5 (ref 0.0–0.7)
IgG (Immunoglobin G), Serum: 540 mg/dL — ABNORMAL LOW (ref 586–1602)
IgG, CSF: 2.3 mg/dL (ref 0.0–6.7)
IgG/Alb Ratio, CSF: 0.07 (ref 0.00–0.25)

## 2019-08-15 LAB — BASIC METABOLIC PANEL
Anion gap: 15 (ref 5–15)
BUN: 26 mg/dL — ABNORMAL HIGH (ref 8–23)
CO2: 23 mmol/L (ref 22–32)
Calcium: 9.2 mg/dL (ref 8.9–10.3)
Chloride: 98 mmol/L (ref 98–111)
Creatinine, Ser: 1.25 mg/dL — ABNORMAL HIGH (ref 0.44–1.00)
GFR calc Af Amer: 52 mL/min — ABNORMAL LOW (ref >60)
GFR calc non Af Amer: 45 mL/min — ABNORMAL LOW (ref >60)
Glucose, Bld: 192 mg/dL — ABNORMAL HIGH (ref 70–99)
Potassium: 3.5 mmol/L (ref 3.5–5.1)
Sodium: 136 mmol/L (ref 135–145)

## 2019-08-15 MED ORDER — CARVEDILOL 6.25 MG PO TABS
6.2500 mg | ORAL_TABLET | Freq: Two times a day (BID) | ORAL | Status: DC
  Administered 2019-08-16 – 2019-08-24 (×16): 6.25 mg via ORAL
  Filled 2019-08-15 (×18): qty 1

## 2019-08-15 NOTE — Progress Notes (Signed)
Physical Therapy Treatment Patient Details Name: Connie Haynes MRN: [id number] DOB: 03-Dec-1955 Today's Date: 08/15/2019    History of Present Illness Connie Haynes is a 64 y.o. 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  Undergoing further work up for transverse myelitis versus other deficiencies.    PT Comments    Continuing work on functional mobility and activity tolerance;  Notably better and smoother steps today; walked same distance as last session, but did not need to take a standing rest break, and pt more relaxed with today's walk than yesterday's; She is motivated to get better, and we took time to discuss and demonstrate some seated therex she can perform independently   Follow Up Recommendations  Outpatient PT     Equipment Recommendations  None recommended by PT    Recommendations for Other Services       Precautions / Restrictions Precautions Precautions: Fall Precaution Comments: decreased LE sensation    Mobility  Bed Mobility Overal bed mobility: Modified Independent Bed Mobility: Supine to Sit     Supine to sit: Modified independent (Device/Increase time)     General bed mobility comments: Modified Independent to sit EOB  Transfers Overall transfer level: Needs assistance Equipment used: Rolling walker (2 wheeled) Transfers: Sit to/from Stand Sit to Stand: Supervision         General transfer comment: Cues for hand placement and safety  Ambulation/Gait Ambulation/Gait assistance: Min guard;Min assist Gait Distance (Feet): 100 Feet Assistive device: Rolling walker (2 wheeled) Gait Pattern/deviations: Step-through pattern;Decreased  step length - right;Decreased step length - left;Decreased stride length     General Gait Details: Cues for relaxing shoulders and for simple smoothness of steps, rather than focusing on steps themselves -- which seems to make pt nervous; Better steps with turns, not as dependent on pivoting, and able to pick up and step feet during turns   Marine scientist Rankin (Stroke Patients Only)       Balance     Sitting balance-Leahy Scale: Normal       Standing balance-Leahy Scale: Fair                              Cognition Arousal/Alertness: Awake/alert Behavior During Therapy: WFL for tasks assessed/performed;Anxious Overall Cognitive Status: Within Functional Limits for tasks assessed                                        Exercises Other Exercises Other Exercises: Serial sit<>stands; one bout of 4, and 2 bouts of 8; no physical assist needed Other Exercises: We discussed seated "target practice" with feet; tapping one foot to the side, in front, and across opposite LE, and then performing with other LE Other Exercises: alternate toe tapping    General Comments        Pertinent Vitals/Pain Pain Assessment: No/denies pain    Home Living  Prior Function            PT Goals (current goals can now be found in the care plan section) Acute Rehab PT Goals Patient Stated Goal: to return to normal; to be able to go hiking again (to see a waterfall) PT Goal Formulation: With patient Time For Goal Achievement: 08/25/19 Potential to Achieve Goals: Good Progress towards PT goals: Progressing toward goals    Frequency    Min 3X/week      PT Plan Current plan remains appropriate    Co-evaluation              AM-PAC PT "6 Clicks" Mobility   Outcome Measure  Help needed turning from your back to your side while in a flat bed without using bedrails?: None Help  needed moving from lying on your back to sitting on the side of a flat bed without using bedrails?: None Help needed moving to and from a bed to a chair (including a wheelchair)?: A Little Help needed standing up from a chair using your arms (e.g., wheelchair or bedside chair)?: A Little Help needed to walk in hospital room?: A Little Help needed climbing 3-5 steps with a railing? : A Little 6 Click Score: 20    End of Session Equipment Utilized During Treatment: Gait belt Activity Tolerance: Patient tolerated treatment well Patient left: in chair;with call bell/phone within reach;with family/visitor present Nurse Communication: Mobility status PT Visit Diagnosis: Other abnormalities of gait and mobility (R26.89);Difficulty in walking, not elsewhere classified (R26.2);Other symptoms and signs involving the nervous system (R29.898)     Time: 6861-6837 PT Time Calculation (min) (ACUTE ONLY): 20 min  Charges:  $Gait Training: 8-22 mins                     Roney Marion, [place]  Acute Rehabilitation Services Pager [phone] Office [place] 08/15/2019, 4:10 PM

## 2019-08-15 NOTE — Progress Notes (Signed)
[facility] Progress Note  Patient: Connie Haynes    UUV:[id number]  DOA: 08/10/2019     Date of Service: the patient was seen and examined on 08/15/2019  Brief hospital course: Connie Haynes is a 64 y.o. 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 CSF Prelim results not suggestive of infectious etiology. Not convincing for inflammatory etiology either. Per neuro an longitudinally extensive inflammatory myelitis is the most likely etiology for the patient's dorsal spinal cord lesion with patchy enhancement on MRI.   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   Neurologist was consulted by ED team, spinal tap was done, ACE levels,ANA, RF,SSA, SSB,B12, DsDNA, neuromyelitis optica autoantibody IgG and anti-Jo 1 antibody were ordered. Majority of the results available right now are negative. Neurologist consult appreciated No indication for antibiotics Neuro started the pt on high dose 1gm IV solumedrol x 5 days. MRI Brain shows right sided inferior frontal lobe/medial temporal lobe increased T2 and FLAIR signal and a 9 mm focus of contrast enhancement suggestive of subacute infarction versus demyelinating disease versus mass.   Continue fall precaution and neuro checks Continue PT/OT eval recommends outpatient therapy. No further oral steroids per  neurology.  Further work-up initiated by neurology.  Essential hypertension Continue Coreg  GERD Continue Protonix  Adrenal insufficiency Hold home hydrocortisone while on IV steroids.  More likely she will be on slow taper so holding cortef until the pt is back to equivalent home regimen of steroids will be the most sensible approach.   Morbid obesity Body mass index is 46.59 kg/m.   Anxiety and tremors Due to steroids  Add ativan Continue celexa  HLD  Continue statin  H/o CVA No residual deficit  Monitor   OSA Restless leg syndrome Continue pramipexole home regimen Discussed with RT about overnight oximetry.  They will perform on 08/14/2019.  Awaiting result.  Patient will benefit from overnight oxygen.  CKD 2 Follows up with nephrology as outpatient  Stable  Diet: cardiac diet DVT Prophylaxis: Subcutaneous Lovenox  enoxaparin (LOVENOX) injection 40 mg Start: 08/14/19 1915 SCDs Start: 08/11/19 0215    Advance goals of care discussion: Full code  Family Communication: no family was present at bedside, at the time of interview.   Disposition:  Status is: Inpatient  Remains inpatient appropriate because:IV treatments appropriate due to intensity of illness or inability to take PO   Dispo: The patient is from: Home              Anticipated d/c is to: Home              Anticipated d/c date is: > 3 days              Patient currently is not medically stable to d/c.  Subjective: No nausea no vomiting.  No fever no chills no chest pain no abdominal pain.  Physical Exam:  General: Appear in mild distress, no Rash; Oral Mucosa Clear, moist. no Abnormal Neck  Mass Or lumps, Conjunctiva normal  Cardiovascular: S1 and S2 Present, no Murmur, Respiratory: good respiratory effort, Bilateral Air entry present and Clear to Auscultation, no Crackles, no wheezes Abdomen: Bowel Sound [present, Soft and nop tenderness Extremities: no Pedal edema, no calf  tenderness Neurology: alert and oriented to time, place, and person affect appropriate. no new focal deficit Gait not checked due to patient safety concerns  Vitals:   08/15/19 0007 08/15/19 0600 08/15/19 1243 08/15/19 1752  BP: (!) 148/67 (!) 174/82 (!) 125/57 (!) 142/67  Pulse: (!) 55 (!) 54 (!) 57 65  Resp: 17 17 17 17   Temp: 98.1 F (36.7 C) 98 F (36.7 C) (!) 97.4 F (36.3 C) (!) 97.5 F (36.4 C)  TempSrc: Oral Oral Oral Oral  SpO2: 92% 98% 93% 97%  Weight:      Height:       No intake or output data in the 24 hours ending 08/15/19 1857 Filed Weights   08/14/19 1900  Weight: 127 kg    Data Reviewed: I have personally reviewed and interpreted daily labs, tele strips, imagings as discussed above. I reviewed all nursing notes, pharmacy notes, vitals, pertinent old records I have discussed plan of care as described above with RN and patient/family.  CBC: Recent Labs  Lab 08/10/19 1543 08/11/19 0234 08/13/19 0232 08/14/19 0556 08/15/19 0703  WBC 8.9 7.4 11.1* 11.5* 10.7*  NEUTROABS 6.1  --   --   --   --   HGB 12.5 11.7* 11.8* 11.4* 13.0  HCT 37.9 37.0 35.3* 34.5* 39.1  MCV 93.1 96.6 90.3 90.3 90.5  PLT 283 176 251 263 259   Basic Metabolic Panel: Recent Labs  Lab 08/10/19 1543 08/11/19 0234 08/13/19 0232 08/14/19 0556 08/15/19 0703  NA 136 137 136 137 136  K 3.4* 3.4* 3.4* 3.3* 3.5  CL 100 103 101 102 98  CO2 26 22 25 24 23   GLUCOSE 107* 97 170* 139* 192*  BUN 19 14 21  25* 26*  CREATININE 0.99 1.01* 1.00 1.02* 1.25*  CALCIUM 9.2 9.2 9.2 9.1 9.2  MG  --  1.8  --   --   --   PHOS  --  3.1  --   --   --     Studies: CT CHEST WO CONTRAST  Result Date: 08/15/2019 CLINICAL DATA:  Sarcoidosis, CNS disease, question adenopathy, history hypertension, will pink cough, sleep apnea, stage III chronic kidney disease, hypertension, GERD EXAM: CT CHEST WITHOUT CONTRAST TECHNIQUE: Multidetector CT imaging of the chest was performed following the standard protocol  without IV contrast. Sagittal and coronal MPR images reconstructed from axial data set. COMPARISON:  07/16/2011 FINDINGS: Cardiovascular: Aorta normal caliber. Heart unremarkable. No pericardial effusion. Mediastinum/Nodes: Esophagus normal appearance. Base of cervical region normal appearance. No enlarged mediastinal or axillary lymph nodes identified. Suboptimal assessment of hila for adenopathy due to lack of IV contrast, no gross mass seen. Lungs/Pleura: New 5 x 10 mm RIGHT middle lobe nodule image 69. Minimal subsegmental atelectasis in lower lobes. Lungs otherwise clear. No infiltrate, pleural effusion or pneumothorax. Upper Abdomen: Visualized upper abdomen unremarkable Musculoskeletal: BILATERAL shoulder prostheses. Prior cervical spine fusion. No acute osseous findings. IMPRESSION: No definite thoracic adenopathy. 5 x 10 mm RIGHT middle lobe nodule image 69; consider one of the following in 3 months for both low-risk and high-risk individuals: (a) repeat chest CT, (b) follow-up PET-CT, or (c) tissue sampling. This recommendation follows the consensus statement: Guidelines for Management of Incidental Pulmonary Nodules Detected on  CT Images: From the Fleischner Society 2017; Radiology 2017; [phone]. Electronically Signed   By: Lavonia Dana M.D.   On: 08/15/2019 16:29    Scheduled Meds: . carvedilol  6.25 mg Oral BID  . citalopram  20 mg Oral QHS  . enoxaparin (LOVENOX) injection  40 mg Subcutaneous Q24H  . lubiprostone  24 mcg Oral BID  . pantoprazole sodium  40 mg Oral Daily  . pramipexole  1.5 mg Oral QHS  . senna-docusate  1 tablet Oral BID  . tuberculin  5 Units Intradermal Once   Continuous Infusions:  PRN Meds: acetaminophen, LORazepam, ondansetron (ZOFRAN) IV, oxyCODONE, tiZANidine  Time spent: 35 minutes  Author: Berle Mull, MD [facility] 08/15/2019 6:57 PM  To reach On-call, see care teams to locate the attending and reach out via [email or web address]. Between 7PM-7AM,  please contact night-coverage If you still have difficulty reaching the attending provider, please page the [facility] (Director on Call) for [facility] on amion for assistance.

## 2019-08-15 NOTE — Plan of Care (Signed)
  Problem: Pain Managment: Goal: General experience of comfort will improve Outcome: Progressing   Problem: Safety: Goal: Ability to remain free from injury will improve Outcome: Progressing   Problem: Skin Integrity: Goal: Risk for impaired skin integrity will decrease Outcome: Progressing   Problem: Skin Integrity: Goal: Risk for impaired skin integrity will decrease Outcome: Progressing

## 2019-08-15 NOTE — Progress Notes (Signed)
Subjective: She feels she is having some improvement  Exam: Vitals:   08/15/19 0600 08/15/19 1243  BP: (!) 174/82 (!) 125/57  Pulse: (!) 54 (!) 57  Resp: 17 17  Temp: 98 F (36.7 C) (!) 97.4 F (36.3 C)  SpO2: 98% 93%   Gen: In bed, NAD Resp: non-labored breathing, no acute distress Abd: soft, nt  Neuro: MS: Awake, alert, interactive and appropriate CN: VFF, EOMI Motor: Though she does not give great effort, she has at least 4/5 strength in all extremities Sensory: Decreased to light touch from just below the knee downwards  Pertinent Labs: CSF WBC 4 CSF RBC 15 CSF Protein 48 CSF Glucose 49 CSF HSV negative  OC Bands - pending IgG index - pending CSF ACE - pending   Serum ACE - 52, normal RNP, SSA, SSB, SCL-70, ENA SM, ds DNA all normal Jo-1 normal RF normal B12 376 TSH normal Homocysteine - normal RPR negative PPD pending Ceruloplasmin normal.   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  There is inflammation as evidenced by contrast enhancement, though no leukocytosis.  The combination of a supratentorial enhancing lesion as well as spinal enhancing lesion would suggest inflammatory process.  Certainly with longitudinal nature, neuromyelitis optica spectrum disorder would be a significant consideration.  Sarcoidosis could be a consideration as well.  Her response to steroids is reassuring, she will finish her course today.  Recommendations: 1) CT chest to evaluate for lymphadenopathy 2) neuromyelitis optica antibody 3) steroids day 5/5  Roland Rack, MD [facility] [phone]  If 7pm- 7am, please page neurology on call as listed in [place].

## 2019-08-16 ENCOUNTER — Inpatient Hospital Stay (HOSPITAL_COMMUNITY): Payer: Medicare Other

## 2019-08-16 ENCOUNTER — Encounter (HOSPITAL_COMMUNITY)
Admission: EM | Disposition: A | Discharge status: Home / Self Care | Payer: Self-pay | Source: Home / Self Care | Attending: Internal Medicine

## 2019-08-16 DIAGNOSIS — R200 Anesthesia of skin: Secondary | ICD-10-CM

## 2019-08-16 DIAGNOSIS — R609 Edema, unspecified: Secondary | ICD-10-CM

## 2019-08-16 DIAGNOSIS — R52 Pain, unspecified: Secondary | ICD-10-CM

## 2019-08-16 DIAGNOSIS — G373 Acute transverse myelitis in demyelinating disease of central nervous system: Principal | ICD-10-CM

## 2019-08-16 DIAGNOSIS — R339 Retention of urine, unspecified: Secondary | ICD-10-CM

## 2019-08-16 HISTORY — PX: INSERTION OF DIALYSIS CATHETER: SHX1324

## 2019-08-16 LAB — BASIC METABOLIC PANEL
Anion gap: 12 (ref 5–15)
Anion gap: 12 (ref 5–15)
BUN: 28 mg/dL — ABNORMAL HIGH (ref 8–23)
BUN: 29 mg/dL — ABNORMAL HIGH (ref 8–23)
CO2: 23 mmol/L (ref 22–32)
CO2: 21 mmol/L — ABNORMAL LOW (ref 22–32)
Calcium: 9 mg/dL (ref 8.9–10.3)
Calcium: 8.9 mg/dL (ref 8.9–10.3)
Chloride: 99 mmol/L (ref 98–111)
Chloride: 101 mmol/L (ref 98–111)
Creatinine, Ser: 1.07 mg/dL — ABNORMAL HIGH (ref 0.44–1.00)
Creatinine, Ser: 1.19 mg/dL — ABNORMAL HIGH (ref 0.44–1.00)
GFR calc Af Amer: >60 mL/min (ref >60)
GFR calc Af Amer: 55 mL/min — ABNORMAL LOW (ref >60)
GFR calc non Af Amer: 54 mL/min — ABNORMAL LOW (ref >60)
GFR calc non Af Amer: 48 mL/min — ABNORMAL LOW (ref >60)
Glucose, Bld: 124 mg/dL — ABNORMAL HIGH (ref 70–99)
Glucose, Bld: 113 mg/dL — ABNORMAL HIGH (ref 70–99)
Potassium: 3.1 mmol/L — ABNORMAL LOW (ref 3.5–5.1)
Potassium: 3 mmol/L — ABNORMAL LOW (ref 3.5–5.1)
Sodium: 134 mmol/L — ABNORMAL LOW (ref 135–145)
Sodium: 134 mmol/L — ABNORMAL LOW (ref 135–145)

## 2019-08-16 LAB — CBC
HCT: 38.1 % (ref 36.0–46.0)
Hemoglobin: 12.7 g/dL (ref 12.0–15.0)
MCH: 30 pg (ref 26.0–34.0)
MCHC: 33.3 g/dL (ref 30.0–36.0)
MCV: 90.1 fL (ref 80.0–100.0)
Platelets: 284 10*3/uL (ref 150–400)
RBC: 4.23 MIL/uL (ref 3.87–5.11)
RDW: 12.5 % (ref 11.5–15.5)
WBC: 10.8 10*3/uL — ABNORMAL HIGH (ref 4.0–10.5)
nRBC: 0 % (ref 0.0–0.2)

## 2019-08-16 LAB — ANGIOTENSIN CONVERTING ENZYME, CSF: Angio Convert Enzyme: 1.8 U/L (ref 0.0–3.1)

## 2019-08-16 LAB — NEUROMYELITIS OPTICA AUTOAB, IGG: NMO-IgG: <1.5 U/mL (ref 0.0–3.0)

## 2019-08-16 LAB — CYTOLOGY - NON PAP

## 2019-08-16 LAB — OLIGOCLONAL BANDS, CSF + SERM

## 2019-08-16 SURGERY — INSERTION OF DIALYSIS CATHETER
Anesthesia: LOCAL

## 2019-08-16 MED ORDER — DIPHENHYDRAMINE HCL 25 MG PO CAPS: 25.0000 mg | ORAL_CAPSULE | Freq: Four times a day (QID) | ORAL | Status: DC | PRN

## 2019-08-16 MED ORDER — CALCIUM CARBONATE ANTACID 500 MG PO CHEW
CHEWABLE_TABLET | ORAL | Status: AC
  Filled 2019-08-16: qty 2

## 2019-08-16 MED ORDER — ACD FORMULA A 0.73-2.45-2.2 GM/100ML VI SOLN
1000.0000 mL | Status: DC
  Administered 2019-08-16: 1000 mL
  Filled 2019-08-16: qty 1000

## 2019-08-16 MED ORDER — HEPARIN SODIUM (PORCINE) 1000 UNIT/ML IJ SOLN
3000.0000 [IU] | Freq: Once | INTRAMUSCULAR | Status: AC
  Administered 2019-08-16: 3000 [IU] via INTRAVENOUS

## 2019-08-16 MED ORDER — ACETAMINOPHEN 325 MG PO TABS
ORAL_TABLET | ORAL | Status: AC
  Filled 2019-08-16: qty 2

## 2019-08-16 MED ORDER — HEPARIN SODIUM (PORCINE) 1000 UNIT/ML IJ SOLN: 1000.0000 [IU] | Freq: Once | INTRAMUSCULAR | Status: DC

## 2019-08-16 MED ORDER — CALCIUM CARBONATE ANTACID 500 MG PO CHEW
2.0000 | CHEWABLE_TABLET | ORAL | Status: AC
  Administered 2019-08-16: 400 mg via ORAL

## 2019-08-16 MED ORDER — HEPARIN SODIUM (PORCINE) 1000 UNIT/ML IJ SOLN
INTRAMUSCULAR | Status: AC
  Filled 2019-08-16: qty 1

## 2019-08-16 MED ORDER — DIPHENHYDRAMINE HCL 25 MG PO CAPS
ORAL_CAPSULE | ORAL | Status: AC
  Administered 2019-08-16: 25 mg
  Filled 2019-08-16: qty 1

## 2019-08-16 MED ORDER — HYDRALAZINE HCL 20 MG/ML IJ SOLN: 10.0000 mg | Freq: Four times a day (QID) | INTRAMUSCULAR | Status: DC | PRN

## 2019-08-16 MED ORDER — LIDOCAINE HCL (PF) 1 % IJ SOLN
INTRAMUSCULAR | Status: AC
  Filled 2019-08-16: qty 30

## 2019-08-16 MED ORDER — CHLORHEXIDINE GLUCONATE CLOTH 2 % EX PADS
6.0000 | MEDICATED_PAD | Freq: Every day | CUTANEOUS | Status: DC
  Administered 2019-08-16 – 2019-08-24 (×9): 6 via TOPICAL

## 2019-08-16 MED ORDER — CALCIUM GLUCONATE-NACL 2-0.675 GM/100ML-% IV SOLN
2.0000 g | Freq: Once | INTRAVENOUS | Status: AC
  Administered 2019-08-16: 2000 mg via INTRAVENOUS
  Filled 2019-08-16 (×2): qty 100

## 2019-08-16 MED ORDER — ACETAMINOPHEN 325 MG PO TABS: 650.0000 mg | ORAL_TABLET | ORAL | Status: DC | PRN

## 2019-08-16 MED ORDER — SODIUM CHLORIDE 0.9 % IV SOLN
INTRAVENOUS | Status: AC
  Filled 2019-08-16 (×3): qty 200

## 2019-08-16 NOTE — Progress Notes (Signed)
PROGRESS NOTE    Connie Haynes  CZY:[id number] DOB: [birth year] DOA: 08/10/2019 PCP: [name], MD     Brief Narrative:  64 y.o.WF PMHx CKD stage III, adrenal insufficiency, history of spinal cord stroke with no residual deficitsS/pneck surgery(about 10 years ago), morbid obesity, HTN, HLD,   Presents to the emergency department due to 1 week onset of bilateral lower extremity numbness and weakness as well as urinary retention. CSF Prelim results not suggestive of infectious etiology. Not convincing for inflammatory etiology either. Per neuro an longitudinally extensive inflammatory myelitis is the most likely etiology for the patient's dorsal spinal cord lesion with patchy enhancement on MRI.   Currently plan is continue IV steroids.   Subjective: A/O x4 patient shuffling around the room with the use of her walker.  Negative nausea, negative vomiting, negative abdominal pain   Assessment & Plan: Covid vaccination; positive vaccination   Principal Problem:   Transverse myelitis ([place]) Active Problems:   Essential hypertension, benign   Morbid obesity ([place])   Adrenal insufficiency (Addison's disease) ([place])   Questionable transverse myelitis possibly due to infectiousvsautoimmunevsinflammatory MRI cervical/thoracic/lumbar spine showedlower cervical spinal cord extending from C4-T3-4 with suggestion of acute transverse myelitis, which could be either infectious or inflammatory/autoimmune in nature. Neurologist was consulted by ED team, spinal tap was done, ACElevels,ANA, RF,SSA, SSB,B12, DsDNA, neuromyelitis optica autoantibodyIgGand anti-Jo 1 antibody were ordered. Majority of the results available right now are negative. Neurologist consult appreciated No indication for antibiotics Neuro started the pt on high dose 1gm IV solumedrol x 5 days. MRI Brain shows right sided inferior frontal lobe/medial temporal lobe increased T2 and FLAIR signal and a 9 mm focus  of contrast enhancement suggestive of subacute infarction versus demyelinating disease versus mass. But based on the spinal cord abnormalities shown yesterday, demyelinating disease is favored. -Fall precautions -Plan is for plasma exchange per neurology.  Essential hypertension -Coreg BID -Hydralazine PRN  GERD -Continue Protonix  Adrenal insufficiency -Hold home hydrocortisone while on IV steroids.  -7/7 most likely will have to restart in the a.m.  .   Morbid obesity Check weight.  Outpatient weight 122kg.   Anxiety and tremors Due to steroids  Add ativan Continue celexa  HLD  -Lipid panel pending  H/o CVA No residual deficit  Monitor   OSA -CPAP per respiratory  Restless leg syndrome Continue pramipexole home regimen Discussed with RT about overnight oximetry.  They will perform on 08/14/2019.  CKD 2 Follows up with nephrology as outpatient  Stable  Goals of care -PT/OT recommends home health.  Will have them reevaluate once course of plasma exchange has been completed   DVT prophylaxis: Lovenox Code Status: Full Family Communication: 7/7 husband at bedside for discussion of plan of care Status is: Inpatient    Dispo: The patient is from: Home              Anticipated d/c is to: Home              Anticipated d/c date is: Neurology              Patient currently unstable      Consultants:  Neurology  Procedures/Significant Events:  7/1 MRI C-spine, T-spine, L-spine W0 contrast;-Abnormal cord signal extending superiorly from the T3-T4 level into the cervical spine -Multilevel degenerative changes. 7/2 LP.  Opening pressure 20 cm H2O MRI brain W/W0 contrast;Abnormality at the inferior frontal lobe/medial temporal lobe on the right with a region of increased T2 and FLAIR signal  and a 9 mm focus of contrast enhancement. Differential diagnosis is late subacute infarction versus demyelinating disease versus mass. Based on the spinal cord  abnormalities shown yesterday, demyelinating disease is favored. A focus of cerebritis seems least likely. Neuro sarcoid is also being considered in the clinical differential diagnosis of the spinal disease, which could possibly be consistent with this brain lesion.  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 7/6 CT chest W0 contrast;No definite thoracic adenopathy. -5 x 10 mm RIGHT middle lobe nodule image 69; consider one of the following in 3 months for both low-risk and high-risk individuals: (a) repeat chest CT,    I have personally reviewed and interpreted all radiology studies and my findings are as above.  VENTILATOR SETTINGS:    Cultures   Antimicrobials:    Devices    LINES / TUBES:      Continuous Infusions:   Objective: Vitals:   08/16/19 0006 08/16/19 0440 08/16/19 0800 08/16/19 1145  BP: 138/65 (!) 165/73 (!) 160/77 (!) 158/88  Pulse: (!) 52 (!) 55  76  Resp: 20 18  18   Temp: 97.7 F (36.5 C) 97.6 F (36.4 C)  98.2 F (36.8 C)  TempSrc: Oral Oral  Oral  SpO2: 98% 99%  92%  Weight:      Height:        Intake/Output Summary (Last 24 hours) at 08/16/2019 1214 Last data filed at 08/16/2019 0500 Gross per 24 hour  Intake 240 ml  Output --  Net 240 ml   Filed Weights   08/14/19 1900  Weight: 127 kg    Examination:  General: A/O x4, No acute respiratory distress Eyes: negative scleral hemorrhage, negative anisocoria, negative icterus ENT: Negative Runny nose, negative gingival bleeding, Neck:  Negative scars, masses, torticollis, lymphadenopathy, JVD Lungs: Clear to auscultation bilaterally without wheezes or crackles Cardiovascular: Regular rate and rhythm without murmur gallop or rub normal S1 and S2 Abdomen: MORBIDLY OBESE, negative abdominal pain, nondistended, positive soft, bowel sounds, no rebound, no ascites, no appreciable mass Extremities: No significant cyanosis, clubbing, or  edema bilateral lower extremities Skin: Negative rashes, lesions, ulcers Psychiatric:  Negative depression, negative anxiety, negative fatigue, negative mania  Central nervous system:  Cranial nerves II through XII intact, tongue/uvula midline, all extremities muscle strength 4/5 greatest on the left upper/left lower extremities.,  Loss sensation bilateral lower extremities, but feels she is starting to get slight feeling left lower extremity.  , finger nose finger bilateral difficult LEFT >> RIGHT, unable to perform  quick finger touch bilateral correctly.  negative dysarthria, negative expressive aphasia, negative receptive aphasia.  .     Data Reviewed: Care during the described time interval was provided by me .  I have reviewed this patient's available data, including medical history, events of note, physical examination, and all test results as part of my evaluation.  CBC: Recent Labs  Lab 08/10/19 1543 08/10/19 1543 08/11/19 0234 08/13/19 0232 08/14/19 0556 08/15/19 0703 08/16/19 0503  WBC 8.9   < > 7.4 11.1* 11.5* 10.7* 10.8*  NEUTROABS 6.1  --   --   --   --   --   --   HGB 12.5   < > 11.7* 11.8* 11.4* 13.0 12.7  HCT 37.9   < > 37.0 35.3* 34.5* 39.1 38.1  MCV 93.1   < > 96.6 90.3 90.3 90.5 90.1  PLT 283   < > 176 251 263 292 284   < > =  values in this interval not displayed.   Basic Metabolic Panel: Recent Labs  Lab 08/11/19 0234 08/13/19 0232 08/14/19 0556 08/15/19 0703 08/16/19 0503  NA 137 136 137 136 134*  K 3.4* 3.4* 3.3* 3.5 3.1*  CL 103 101 102 98 99  CO2 22 25 24 23 23   GLUCOSE 97 170* 139* 192* 124*  BUN 14 21 25* 26* 28*  CREATININE 1.01* 1.00 1.02* 1.25* 1.07*  CALCIUM 9.2 9.2 9.1 9.2 9.0  MG 1.8  --   --   --   --   PHOS 3.1  --   --   --   --    GFR: Estimated Creatinine Clearance: 69.4 mL/min (A) (by C-G formula based on SCr of 1.07 mg/dL (H)). Liver Function Tests: Recent Labs  Lab 08/10/19 2356 08/11/19 0234  AST  --  20  ALT  --  16    ALKPHOS  --  58  BILITOT  --  0.8  PROT  --  6.1*  ALBUMIN 4.0 3.7   No results for input(s): LIPASE, AMYLASE in the last 168 hours. No results for input(s): AMMONIA in the last 168 hours. Coagulation Profile: Recent Labs  Lab 08/11/19 0400  INR 1.1   Cardiac Enzymes: No results for input(s): CKTOTAL, CKMB, CKMBINDEX, TROPONINI in the last 168 hours. BNP (last 3 results) No results for input(s): PROBNP in the last 8760 hours. HbA1C: No results for input(s): HGBA1C in the last 72 hours. CBG: No results for input(s): GLUCAP in the last 168 hours. Lipid Profile: No results for input(s): CHOL, HDL, LDLCALC, TRIG, CHOLHDL, LDLDIRECT in the last 72 hours. Thyroid Function Tests: No results for input(s): TSH, T4TOTAL, FREET4, T3FREE, THYROIDAB in the last 72 hours. Anemia Panel: No results for input(s): VITAMINB12, FOLATE, FERRITIN, TIBC, IRON, RETICCTPCT in the last 72 hours. Sepsis Labs: No results for input(s): PROCALCITON, LATICACIDVEN in the last 168 hours.  Recent Results (from the past 240 hour(s))  SARS Coronavirus 2 by RT PCR (hospital order, performed in [facility] hospital lab) Nasopharyngeal Nasopharyngeal Swab     Status: None   Collection Time: 08/10/19  3:43 PM   Specimen: Nasopharyngeal Swab  Result Value Ref Range Status   SARS Coronavirus 2 NEGATIVE NEGATIVE Final    Comment: (NOTE) SARS-CoV-2 target nucleic acids are NOT DETECTED.  The SARS-CoV-2 RNA is generally detectable in upper and lower respiratory specimens during the acute phase of infection. The lowest concentration of SARS-CoV-2 viral copies this assay can detect is 250 copies / mL. A negative result does not preclude SARS-CoV-2 infection and should not be used as the sole basis for treatment or other patient management decisions.  A negative result may occur with improper specimen collection / handling, submission of specimen other than nasopharyngeal swab, presence of viral mutation(s) within  the areas targeted by this assay, and inadequate number of viral copies (<250 copies / mL). A negative result must be combined with clinical observations, patient history, and epidemiological information.  Fact Sheet for Patients:   StrictlyIdeas.no  Fact Sheet for Healthcare Providers: BankingDealers.co.za  This test is not yet approved or  cleared by the [place] FDA and has been authorized for detection and/or diagnosis of SARS-CoV-2 by FDA under an Emergency Use Authorization (EUA).  This EUA will remain in effect (meaning this test can be used) for the duration of the COVID-19 declaration under Section 564(b)(1) of the Act, 21 U.S.C. section 360bbb-3(b)(1), unless the authorization is terminated or revoked sooner.  Performed at [facility], [address] [place], [place] [address]   Urine Culture     Status: None   Collection Time: 08/10/19  7:05 PM   Specimen: Urine, Random  Result Value Ref Range Status   Specimen Description URINE, RANDOM  Final   Special Requests NONE  Final   Culture   Final    NO GROWTH Performed at [facility], [place] [address] [place], [place] [address]    Report Status 08/12/2019 FINAL  Final  CSF culture with Stat gram stain     Status: None   Collection Time: 08/10/19  9:46 PM   Specimen: CSF; Cerebrospinal Fluid  Result Value Ref Range Status   Specimen Description CSF  Final   Special Requests NONE  Final   Gram Stain   Final    WBC PRESENT,BOTH PMN AND MONONUCLEAR NO ORGANISMS SEEN CYTOSPIN SMEAR    Culture   Final    NO GROWTH Performed at [facility], [place] [address] [place],  [address]    Report Status 08/14/2019 FINAL  Final         Radiology Studies: CT CHEST WO CONTRAST  Result Date: 08/15/2019 CLINICAL DATA:  Sarcoidosis, CNS disease, question adenopathy, history hypertension, will pink cough, sleep apnea, stage III chronic kidney  disease, hypertension, GERD EXAM: CT CHEST WITHOUT CONTRAST TECHNIQUE: Multidetector CT imaging of the chest was performed following the standard protocol without IV contrast. Sagittal and coronal MPR images reconstructed from axial data set. COMPARISON:  07/16/2011 FINDINGS: Cardiovascular: Aorta normal caliber. Heart unremarkable. No pericardial effusion. Mediastinum/Nodes: Esophagus normal appearance. Base of cervical region normal appearance. No enlarged mediastinal or axillary lymph nodes identified. Suboptimal assessment of hila for adenopathy due to lack of IV contrast, no gross mass seen. Lungs/Pleura: New 5 x 10 mm RIGHT middle lobe nodule image 69. Minimal subsegmental atelectasis in lower lobes. Lungs otherwise clear. No infiltrate, pleural effusion or pneumothorax. Upper Abdomen: Visualized upper abdomen unremarkable Musculoskeletal: BILATERAL shoulder prostheses. Prior cervical spine fusion. No acute osseous findings. IMPRESSION: No definite thoracic adenopathy. 5 x 10 mm RIGHT middle lobe nodule image 69; consider one of the following in 3 months for both low-risk and high-risk individuals: (a) repeat chest CT, (b) follow-up PET-CT, or (c) tissue sampling. This recommendation follows the consensus statement: Guidelines for Management of Incidental Pulmonary Nodules Detected on CT Images: From the Fleischner Society 2017; Radiology 2017; 284:228-243. Electronically Signed   By: Lavonia Dana M.D.   On: 08/15/2019 16:29   VAS [place] LOWER EXTREMITY VENOUS (DVT)  Result Date: 08/16/2019  Lower Venous DVTStudy Indications: Pain, and Edema.  Limitations: Pain tolerance. Comparison Study: no prior Performing Technologist: Abram Sander RVS  Examination Guidelines: A complete evaluation includes B-mode imaging, spectral Doppler, color Doppler, and power Doppler as needed of all accessible portions of each vessel. Bilateral testing is considered an integral part of a complete examination. Limited examinations for  reoccurring indications may be performed as noted. The reflux portion of the exam is performed with the patient in reverse Trendelenburg.  +---------+---------------+---------+-----------+----------+-------------------+ RIGHT    CompressibilityPhasicitySpontaneityPropertiesThrombus Aging      +---------+---------------+---------+-----------+----------+-------------------+ CFV      Full           Yes      Yes                                      +---------+---------------+---------+-----------+----------+-------------------+  SFJ      Full                                                             +---------+---------------+---------+-----------+----------+-------------------+ FV Prox  Full                                                             +---------+---------------+---------+-----------+----------+-------------------+ FV Mid                  Yes      Yes                  unable to compress                                                        due to pain                                                               tolerance           +---------+---------------+---------+-----------+----------+-------------------+ FV Distal               Yes      Yes                  unable to compress                                                        due to pain                                                               tolerance           +---------+---------------+---------+-----------+----------+-------------------+ PFV      Full                                                             +---------+---------------+---------+-----------+----------+-------------------+ POP      Full           Yes      Yes                                      +---------+---------------+---------+-----------+----------+-------------------+  PTV      Full                                                              +---------+---------------+---------+-----------+----------+-------------------+ PERO     Full                                                             +---------+---------------+---------+-----------+----------+-------------------+   +---------+---------------+---------+-----------+----------+-------------------+ LEFT     CompressibilityPhasicitySpontaneityPropertiesThrombus Aging      +---------+---------------+---------+-----------+----------+-------------------+ CFV      Full           Yes      Yes                                      +---------+---------------+---------+-----------+----------+-------------------+ SFJ      Full                                                             +---------+---------------+---------+-----------+----------+-------------------+ FV Prox  Full           Yes      Yes                  unable to compress                                                        due to pain                                                               tolerance           +---------+---------------+---------+-----------+----------+-------------------+ FV Mid                  Yes      Yes                  unable to compress                                                        due to pain  tolerance           +---------+---------------+---------+-----------+----------+-------------------+ FV DistalFull                                                             +---------+---------------+---------+-----------+----------+-------------------+ PFV      Full                                                             +---------+---------------+---------+-----------+----------+-------------------+ POP      Full           Yes      Yes                                      +---------+---------------+---------+-----------+----------+-------------------+  PTV      Full                                                             +---------+---------------+---------+-----------+----------+-------------------+ PERO     Full                                                             +---------+---------------+---------+-----------+----------+-------------------+    Summary: BILATERAL: - No evidence of deep vein thrombosis seen in the lower extremities, bilaterally. -No evidence of popliteal cyst, bilaterally.   *See table(s) above for measurements and observations.    Preliminary         Scheduled Meds: . carvedilol  6.25 mg Oral BID  . citalopram  20 mg Oral QHS  . enoxaparin (LOVENOX) injection  40 mg Subcutaneous Q24H  . lubiprostone  24 mcg Oral BID  . pantoprazole sodium  40 mg Oral Daily  . pramipexole  1.5 mg Oral QHS  . senna-docusate  1 tablet Oral BID   Continuous Infusions:   LOS: 5 days    Time spent:40 min    Keisean Skowron, Geraldo Docker, MD [facility] Pager [phone]  If 7PM-7AM, please contact night-coverage [email or web address] Password [facility] 08/16/2019, 12:14 PM

## 2019-08-16 NOTE — Progress Notes (Signed)
Occupational Therapy Treatment Patient Details Name: Connie Haynes MRN: [id number] DOB: 06/09/1955 Today's Date: 08/16/2019    History of present illness Connie Haynes is a 64 y.o. 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  Undergoing further work up for transverse myelitis versus other deficiencies.   OT comments  Pt progressing with OT goals and remains motivated to progress with therapy. Pt received finishing task of washing hair at sink with RW. Pt reporting improved sensation of R LE, but continues to have impaired sensation in L LE and pelvic region. Due to asymmetrical return of sensation, pt with increased difficulty completing hallway distance mobility requiring min guard for steadying. Provided cues for pacing and calming strategies as increasing anxiety impacted stability on feet with improved performance noted after OT intervention. Educated pt on toileting schedule implementation to increase continence despite pelvic region numbness. Will continue to follow pt acutely.    Follow Up Recommendations  No OT follow up;Supervision - Intermittent    Equipment Recommendations  None recommended by OT    Recommendations for Other Services      Precautions / Restrictions Precautions Precautions: Fall Precaution Comments: decreased LE sensation Restrictions Weight Bearing Restrictions: No       Mobility Bed Mobility               General bed mobility comments: recieved sitting EOB  Transfers Overall transfer level: Needs assistance Equipment used: Rolling walker (2 wheeled) Transfers: Sit to/from [facility] Sit to Stand: Supervision Stand  pivot transfers: Min guard       General transfer comment: min guard for stand pivot to ensure stability as pt more unsteady today reporting feeling "lopsided" due to R LE sensation improving while L LE still impaired    Balance Overall balance assessment: Needs assistance Sitting-balance support: Feet supported Sitting balance-Leahy Scale: Normal     Standing balance support: During functional activity;Bilateral upper extremity supported Standing balance-Leahy Scale: Fair Standing balance comment: More reliant on UE support today for static/dynamic standing balance                            ADL either performed or assessed with clinical judgement   ADL Overall ADL's : Needs assistance/impaired     Grooming: Supervision/safety;Standing Grooming Details (indicate cue type and reason): Pt finishing up washing hair at sink with RW, reports some difficulty                              Functional mobility during ADLs: Min guard;Rolling walker General ADL Comments: pt min guard for mobility using RW, some increased unsteadiness with increased anxiety. Cueing for pacing and calming strategies     Vision   Vision Assessment?: No apparent visual deficits   Perception     Praxis      Cognition Arousal/Alertness: Awake/alert Behavior During Therapy: WFL for tasks assessed/performed;Anxious Overall Cognitive Status: Within Functional Limits for tasks assessed  Exercises     Shoulder Instructions       General Comments Since symptoms that resulted in admission, pt continues to report decreased sensation in pelvic region, unsure of when she needs to use bathroom. Educated pt on setting up toileting schedule to decrease episodes of incontinence with pt already demonstrating some of these tactics. Plan to continue training and education to maximize independence/dignity at home`    Pertinent Vitals/  Pain       Pain Assessment: No/denies pain  Home Living                                          Prior Functioning/Environment              Frequency  Min 2X/week        Progress Toward Goals  OT Goals(current goals can now be found in the care plan section)  Progress towards OT goals: Progressing toward goals  Acute Rehab OT Goals Patient Stated Goal: to return to normal; to be able to go hiking again (to see a waterfall) OT Goal Formulation: With patient Time For Goal Achievement: 08/26/19 Potential to Achieve Goals: Good ADL Goals Pt Will Perform Grooming: with modified independence;standing Pt Will Perform Lower Body Bathing: with modified independence;sitting/lateral leans;sit to/from stand Pt Will Perform Lower Body Dressing: with modified independence;sit to/from stand;sitting/lateral leans Pt Will Transfer to Toilet: with modified independence;ambulating Pt Will Perform Toileting - Clothing Manipulation and hygiene: with modified independence;sit to/from stand;sitting/lateral leans Pt Will Perform Tub/Shower Transfer: Shower transfer;ambulating;3 in 1;with supervision  Plan Discharge plan remains appropriate    Co-evaluation                 AM-PAC OT "6 Clicks" Daily Activity     Outcome Measure   Help from another person eating meals?: None Help from another person taking care of personal grooming?: A Little Help from another person toileting, which includes using toliet, bedpan, or urinal?: None Help from another person bathing (including washing, rinsing, drying)?: A Little Help from another person to put on and taking off regular upper body clothing?: None Help from another person to put on and taking off regular lower body clothing?: A Little 6 Click Score: 21    End of Session Equipment Utilized During Treatment: Rolling walker  OT Visit Diagnosis: Unsteadiness on feet (R26.81);Other abnormalities of gait and mobility  (R26.89)   Activity Tolerance Patient tolerated treatment well   Patient Left with call bell/phone within reach   Nurse Communication Mobility status        Time: [id number] OT Time Calculation (min): 26 min  Charges: OT General Charges $OT Visit: 1 Visit OT Treatments $Therapeutic Activity: 23-37 mins  Layla Maw, OTR/L   Layla Maw 08/16/2019, 2:49 PM

## 2019-08-16 NOTE — Progress Notes (Signed)
Subjective: She feels she has had some, though markedly incomplete improvemetn.   Exam: Vitals:   08/16/19 0440 08/16/19 0800  BP: (!) 165/73 (!) 160/77  Pulse: (!) 55   Resp: 18   Temp: 97.6 F (36.4 C)   SpO2: 99%    Gen: In bed, NAD Resp: non-labored breathing, no acute distress Abd: soft, nt  Neuro: MS: Awake, alert, interactive and appropriate CN: VFF, EOMI Motor: Though she does not give great effort, she has at least 4-/5 strength in all extremities, left foot with 2/5 dorsiflexion.  Sensory: Decreased to light touch from just below the knee downwards Gait is markedly impaired  Pertinent Labs: CSF WBC 4 CSF RBC 15 CSF Protein 48 CSF Glucose 49 CSF HSV negative  OC Bands - pending IgG index - pending CSF ACE - pending   Serum ACE - 52, normal RNP, SSA, SSB, SCL-70, ENA SM, ds DNA all normal Jo-1 normal RF normal B12 376 TSH normal Homocysteine - normal RPR negative PPD pending Ceruloplasmin normal.   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  There is inflammation as evidenced by contrast enhancement, though no leukocytosis.  The combination of a supratentorial enhancing lesion as well as spinal enhancing lesion would suggest inflammatory process.  Certainly with longitudinal nature, neuromyelitis optica spectrum disorder(NOSD) would be a significant consideration.  Sarcoidosis could be a consideration as well, but no supportive findings have been found so I think this is less likely.   NOSD is treated typically with steroids, but with only a smal lresponse to steroids, I would favor rescue therapy with plasmapheresis.   Recommendations: 1) neuromyelitis optica antibody, MOG antibody pending.  2) plasmapheresis. I have discussed access with CCM  Roland Rack, MD [facility] [phone]  If 7pm- 7am, please page neurology on call as listed in [place].

## 2019-08-16 NOTE — Interval H&P Note (Signed)
History and Physical Interval Note:  08/16/2019 1:57 PM  Connie Haynes  has presented today for surgery, with the diagnosis of hd access.  The various methods of treatment have been discussed with the patient and family. After consideration of risks, benefits and other options for treatment, the patient has consented to  Procedure(s): INSERTION OF DIALYSIS CATHETER (N/A) as a surgical intervention.  The patient's history has been reviewed, patient examined, no change in status, stable for surgery.  I have reviewed the patient's chart and labs.  Questions were answered to the patient's satisfaction.    Patient seen and consented for HD catheter by Noe Gens, NP in [place].   [place]

## 2019-08-16 NOTE — Op Note (Addendum)
Hemodialysis Catheter Insertion Procedure Note ANJANAE WOEHRLE [id number] 06-May-1955  Procedure: Insertion of Hemodialysis Catheter Indications: Hemodialysis  Procedure Details Consent: Risks of procedure as well as the alternatives and risks of each were explained to the (patient/caregiver).  Consent for procedure obtained.  Time Out: Verified patient identification, verified procedure, site/side was marked, verified correct patient position, special equipment/implants available, medications/allergies/relevent history reviewed, required imaging and test results available.  Performed  Maximum sterile technique was used including antiseptics, cap, gloves, gown, hand hygiene, mask and sheet.  Skin prep: Chlorhexidine; local anesthetic administered  A Trialysis HD catheter was placed in the right internal jugular vein using the Seldinger technique.  Biopatch applied, sutured in place.  Heparin dosing verified with RN - 1.2 ml of 87ml to 1000 units heparin instilled into blue and red ports per protocol.   Evaluation Blood flow good Complications: No apparent complications Patient did tolerate procedure well. Chest X-ray ordered to verify placement.  CXR: pending.   Procedure performed under direct supervision of Dr. Valeta Harms and with ultrasound guidance for real time vessel cannulation.     Noe Gens, NP-C [facility] Pgr: [phone] or [phone] 08/16/2019, 2:40 PM

## 2019-08-16 NOTE — Progress Notes (Signed)
Lower extremity venous has been completed.   Preliminary results in CV Proc.   Abram Sander 08/16/2019 10:27 AM

## 2019-08-17 LAB — CBC WITH DIFFERENTIAL/PLATELET
Abs Immature Granulocytes: 0.12 10*3/uL — ABNORMAL HIGH (ref 0.00–0.07)
Basophils Absolute: 0 10*3/uL (ref 0.0–0.1)
Basophils Relative: 0 %
Eosinophils Absolute: 0 10*3/uL (ref 0.0–0.5)
Eosinophils Relative: 0 %
HCT: 36.6 % (ref 36.0–46.0)
Hemoglobin: 12 g/dL (ref 12.0–15.0)
Immature Granulocytes: 1 %
Lymphocytes Relative: 15 %
Lymphs Abs: 1.4 10*3/uL (ref 0.7–4.0)
MCH: 30.1 pg (ref 26.0–34.0)
MCHC: 32.8 g/dL (ref 30.0–36.0)
MCV: 91.7 fL (ref 80.0–100.0)
Monocytes Absolute: 0.7 10*3/uL (ref 0.1–1.0)
Monocytes Relative: 7 %
Neutro Abs: 6.9 10*3/uL (ref 1.7–7.7)
Neutrophils Relative %: 77 %
Platelets: 245 10*3/uL (ref 150–400)
RBC: 3.99 MIL/uL (ref 3.87–5.11)
RDW: 12.6 % (ref 11.5–15.5)
WBC: 9.1 10*3/uL (ref 4.0–10.5)
nRBC: 0 % (ref 0.0–0.2)

## 2019-08-17 LAB — VDRL, CSF: VDRL Quant, CSF: NONREACTIVE

## 2019-08-17 LAB — COMPREHENSIVE METABOLIC PANEL
ALT: 24 U/L (ref 0–44)
AST: 21 U/L (ref 15–41)
Albumin: 3.6 g/dL (ref 3.5–5.0)
Alkaline Phosphatase: 29 U/L — ABNORMAL LOW (ref 38–126)
Anion gap: 7 (ref 5–15)
BUN: 26 mg/dL — ABNORMAL HIGH (ref 8–23)
CO2: 27 mmol/L (ref 22–32)
Calcium: 8.3 mg/dL — ABNORMAL LOW (ref 8.9–10.3)
Chloride: 103 mmol/L (ref 98–111)
Creatinine, Ser: 1.11 mg/dL — ABNORMAL HIGH (ref 0.44–1.00)
GFR calc Af Amer: 60 mL/min — ABNORMAL LOW (ref >60)
GFR calc non Af Amer: 52 mL/min — ABNORMAL LOW (ref >60)
Glucose, Bld: 102 mg/dL — ABNORMAL HIGH (ref 70–99)
Potassium: 3.2 mmol/L — ABNORMAL LOW (ref 3.5–5.1)
Sodium: 137 mmol/L (ref 135–145)
Total Bilirubin: 0.8 mg/dL (ref 0.3–1.2)
Total Protein: 4.9 g/dL — ABNORMAL LOW (ref 6.5–8.1)

## 2019-08-17 LAB — LIPID PANEL
Cholesterol: 79 mg/dL (ref 0–200)
HDL: 22 mg/dL — ABNORMAL LOW (ref >40)
LDL Cholesterol: 29 mg/dL (ref 0–99)
Total CHOL/HDL Ratio: 3.6 RATIO
Triglycerides: 141 mg/dL (ref <150)
VLDL: 28 mg/dL (ref 0–40)

## 2019-08-17 LAB — MISC LABCORP TEST (SEND OUT): Labcorp test code: 505310

## 2019-08-17 LAB — METHYLMALONIC ACID, SERUM: Methylmalonic Acid, Quantitative: 298 nmol/L (ref 0–378)

## 2019-08-17 LAB — ANCA TITERS
Atypical P-ANCA titer: <1:20 {titer}
C-ANCA: <1:20 {titer}
P-ANCA: <1:20 {titer}

## 2019-08-17 LAB — PHOSPHORUS: Phosphorus: 3.8 mg/dL (ref 2.5–4.6)

## 2019-08-17 LAB — MAGNESIUM: Magnesium: 2.2 mg/dL (ref 1.7–2.4)

## 2019-08-17 MED ORDER — ACETAMINOPHEN 325 MG PO TABS: 650.0000 mg | ORAL_TABLET | ORAL | Status: DC | PRN

## 2019-08-17 MED ORDER — SODIUM CHLORIDE 0.9 % IV SOLN: INTRAVENOUS | Status: DC

## 2019-08-17 MED ORDER — HEPARIN SODIUM (PORCINE) 1000 UNIT/ML IJ SOLN: 1000.0000 [IU] | Freq: Once | INTRAMUSCULAR | Status: DC

## 2019-08-17 MED ORDER — ACD FORMULA A 0.73-2.45-2.2 GM/100ML VI SOLN: 1000.0000 mL | Status: DC

## 2019-08-17 MED ORDER — POTASSIUM CHLORIDE CRYS ER 20 MEQ PO TBCR
50.0000 meq | EXTENDED_RELEASE_TABLET | Freq: Once | ORAL | Status: AC
  Administered 2019-08-17: 50 meq via ORAL
  Filled 2019-08-17: qty 2

## 2019-08-17 MED ORDER — CALCIUM CARBONATE ANTACID 500 MG PO CHEW: 2.0000 | CHEWABLE_TABLET | ORAL | Status: DC

## 2019-08-17 MED ORDER — DIPHENHYDRAMINE HCL 25 MG PO CAPS: 25.0000 mg | ORAL_CAPSULE | Freq: Four times a day (QID) | ORAL | Status: DC | PRN

## 2019-08-17 MED ORDER — CALCIUM GLUCONATE-NACL 2-0.675 GM/100ML-% IV SOLN
2.0000 g | INTRAVENOUS | Status: DC
  Filled 2019-08-17: qty 100

## 2019-08-17 MED ORDER — SODIUM CHLORIDE 0.9% FLUSH: 10.0000 mL | INTRAVENOUS | Status: DC | PRN

## 2019-08-17 NOTE — Progress Notes (Signed)
Patient refused CPAP.  Patients states she has told her doctor she does not want to wear it and has since been wearing [place].  Vitals stable RT will continue to monitor.

## 2019-08-17 NOTE — Progress Notes (Signed)
Physical Therapy Treatment Patient Details Name: Connie Haynes MRN: [id number] DOB: October 27, 1955 Today's Date: 08/17/2019    History of Present Illness Connie Haynes is a 64 y.o. 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  Undergoing further work up for transverse myelitis versus other deficiencies.    PT Comments    Pt seen for mobility progression. She reported that she feels more weak and tired today compared to previous days. Focus of session was on higher level balance activities with standing and walking, as well as general LE strengthening and coordination therex. Pt would continue to benefit from skilled physical therapy services at this time while admitted and after d/c to address the below listed limitations in order to improve overall safety and independence with functional mobility.    Follow Up Recommendations  Outpatient PT     Equipment Recommendations  None recommended by PT    Recommendations for Other Services       Precautions / Restrictions Precautions Precautions: Fall Precaution Comments: decreased LE sensation (L>R) Restrictions Weight Bearing Restrictions: No    Mobility  Bed Mobility Overal bed mobility: Modified Independent                Transfers Overall transfer level: Needs assistance Equipment used: Rolling walker (2 wheeled) Transfers: Sit to/from Stand Sit to Stand: Supervision         General transfer comment: supervision with good technique utilized  Ambulation/Gait Ambulation/Gait assistance: Min guard;Min assist   Assistive device: Rolling walker (2 wheeled) Gait Pattern/deviations: Step-through pattern;Decreased  step length - right;Decreased step length - left;Decreased stride length Gait velocity: decreased   General Gait Details: practiced higher level balance tasks in standing and with walking in room; pt ambulating ~8' forwards and backwards x2 with RW and close min guard for safety; pt also ambulating ~8' sideways in both directions x2. Pt very slow, purposeful and thoughtful with her movement. Cueing needed to relax shoulders   Stairs             Wheelchair Mobility    Modified Rankin (Stroke Patients Only)       Balance Overall balance assessment: Needs assistance Sitting-balance support: Feet supported Sitting balance-Leahy Scale: Normal     Standing balance support: During functional activity;Bilateral upper extremity supported Standing balance-Leahy Scale: Poor               High level balance activites: Side stepping;Backward walking;Turns High Level Balance Comments: close min guard            Cognition Arousal/Alertness: Awake/alert Behavior During Therapy: WFL for tasks assessed/performed;Anxious Overall Cognitive Status: Within Functional Limits for tasks assessed                                        Exercises General Exercises - Lower Extremity Ankle Circles/Pumps: AROM;Both;20 reps;Seated Long Arc Quad: AROM;Both;10 reps;Seated Hip Flexion/Marching: AROM;Strengthening;Both;10 reps;Seated Heel Raises: AROM;Strengthening;Both;10 reps;Seated    General Comments        Pertinent Vitals/Pain Pain Assessment: No/denies pain    Home Living  Prior Function            PT Goals (current goals can now be found in the care plan section) Acute Rehab PT Goals PT Goal Formulation: With patient Time For Goal Achievement: 08/25/19 Potential to Achieve Goals: Good Progress towards PT goals: Progressing toward goals    Frequency    Min 3X/week      PT Plan Current plan remains appropriate     Co-evaluation              AM-PAC PT "6 Clicks" Mobility   Outcome Measure  Help needed turning from your back to your side while in a flat bed without using bedrails?: None Help needed moving from lying on your back to sitting on the side of a flat bed without using bedrails?: None Help needed moving to and from a bed to a chair (including a wheelchair)?: None Help needed standing up from a chair using your arms (e.g., wheelchair or bedside chair)?: None Help needed to walk in hospital room?: A Little Help needed climbing 3-5 steps with a railing? : A Lot 6 Click Score: 21    End of Session Equipment Utilized During Treatment: Gait belt Activity Tolerance: Patient tolerated treatment well Patient left: in bed;with call bell/phone within reach Nurse Communication: Mobility status PT Visit Diagnosis: Other abnormalities of gait and mobility (R26.89);Difficulty in walking, not elsewhere classified (R26.2);Other symptoms and signs involving the nervous system (R29.898)     Time: 9449-6759 PT Time Calculation (min) (ACUTE ONLY): 26 min  Charges:  $Gait Training: 8-22 mins $Therapeutic Exercise: 8-22 mins                     Anastasio Champion, DPT  Acute Rehabilitation Services Pager [phone] Office [place] 08/17/2019, 12:08 PM

## 2019-08-17 NOTE — Progress Notes (Signed)
Subjective: She feels she can sense when she has to pee now.   Exam: Vitals:   08/17/19 0020 08/17/19 0534  BP: (!) 122/58 (!) 141/64  Pulse: (!) 58 (!) 51  Resp: 16 16  Temp: 97.8 F (36.6 C) 97.8 F (36.6 C)  SpO2: 99% 100%   Gen: In bed, NAD Resp: non-labored breathing, no acute distress Abd: soft, nt  Neuro: MS: Awake, alert, interactive and appropriate CN: VFF, EOMI Motor: Though she does not give great effort, she has at least 4-/5 strength in all extremities, left foot with 1/5 dorsiflexion.  Sensory: Decreased to light touch from just below the knee downwards   Pertinent Labs: CSF WBC 4 CSF RBC 15 CSF Protein 48 CSF Glucose 49 CSF HSV negative  OC Bands - normal IgG index - normal CSF ACE - normal   Serum ACE - 52, normal RNP, SSA, SSB, SCL-70, ENA SM, ds DNA all normal Jo-1 normal RF normal B12 376 TSH normal Homocysteine - normal RPR negative Ceruloplasmin normal.   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  There is inflammation as evidenced by contrast enhancement, though no leukocytosis.  The combination of a supratentorial enhancing lesion as well as spinal enhancing lesion would suggest inflammatory process.  Certainly with longitudinal nature, neuromyelitis optica spectrum disorder(NOSD) would be a significant consideration.  Sarcoidosis could be a consideration as well, but no supportive findings have been found so I think this is less likely.   NOSD is treated typically with steroids and often PLEX, but with only a small response to steroids, I would favor rescue therapy with plasmapheresis for the transverse myelitis.    Recommendations: 1) neuromyelitis optica antibody, MOG antibody pending.  2) plasmapheresis treatment 1 7/7, tx # 2 today, then QOD  Roland Rack, MD [facility] [phone]  If 7pm- 7am, please page neurology on call as listed in [place].

## 2019-08-17 NOTE — Progress Notes (Signed)
PROGRESS NOTE    Connie Haynes  WLN:[id number] DOB: 1955/06/03 DOA: 08/10/2019 PCP: [name], MD     Brief Narrative:  64 y.o.WF PMHx CKD stage III, adrenal insufficiency, history of spinal cord stroke with no residual deficitsS/pneck surgery(about 10 years ago), morbid obesity, HTN, HLD,   Presents to the emergency department due to 1 week onset of bilateral lower extremity numbness and weakness as well as urinary retention. CSF Prelim results not suggestive of infectious etiology. Not convincing for inflammatory etiology either. Per neuro an longitudinally extensive inflammatory myelitis is the most likely etiology for the patient's dorsal spinal cord lesion with patchy enhancement on MRI.   Currently plan is continue IV steroids.   Subjective: 7/8 A/O x4, negative CP, negative abdominal pain, positive fatigue.  S/p 1 session of plasmapheresis   Assessment & Plan: Covid vaccination; positive vaccination   Principal Problem:   Transverse myelitis ([place]) Active Problems:   Essential hypertension, benign   Morbid obesity ([place])   Adrenal insufficiency (Addison's disease) ([place])   Transverse myelitis -Neuromyelitis antibody positive -7/1 s/p LP; negative for infection. -ACElevels,ANA, RF,SSA, SSB,B12, DsDNA, neuromyelitis optica autoantibodyIgGand anti-Jo 1 antibody were ordered.  Pending -Neuro started the pt on high dose 1gm IV solumedrol x 5 days. -7/2 MRI Brain shows right sided inferior frontal lobe/medial temporal lobe increased T2 and FLAIR signal and a 9 mm focus of contrast enhancement suggestive of subacute infarction versus demyelinating disease versus mass. But based on the spinal cord abnormalities shown yesterday, demyelinating disease is favored. -Fall precautions -7/7 s/p.  Plasma exchange x1.  Today will be a holiday, and plan is to restart tomorrow  Essential hypertension -Coreg BID -Hydralazine PRN  GERD -Continue Protonix  Adrenal  insufficiency -Hold home hydrocortisone while on IV steroids.  -7/8 continue to hold home Solu-Cortef, BP still slightly high will need to monitor closely.  .   Morbid obesity -Outpatient weight 122kg.   Anxiety and tremors -Citalopram 20 mg daily -Lorazepam 0.5 mg  PRN   HLD  -7/8 LDL= 29  H/o CVA No residual deficit  Monitor   OSA -CPAP per respiratory  Restless leg syndrome -Pramipexole 1.5 mg daily   CKD stage II Recent Labs  Lab 08/14/19 0556 08/15/19 0703 08/16/19 0503 08/16/19 1544 08/17/19 0413  CREATININE 1.02* 1.25* 1.07* 1.19* 1.11*  -Follows up with nephrology as outpatient  -Stable  Hypokalemia -K-Dur 50 mEq   Goals of care -PT/OT recommends home health.  Will have them reevaluate once course of plasma exchange has been completed   DVT prophylaxis: Lovenox Code Status: Full Family Communication: 7/7 husband at bedside for discussion of plan of care Status is: Inpatient    Dispo: The patient is from: Home              Anticipated d/c is to: Home              Anticipated d/c date is: Neurology              Patient currently unstable      Consultants:  Neurology   Procedures/Significant Events:  7/1 s/p LP 7/1 MRI C-spine, T-spine, L-spine W0 contrast;-Abnormal cord signal extending superiorly from the T3-T4 level into the cervical spine -Multilevel degenerative changes. 7/2 LP.  Opening pressure 20 cm H2O 7/2 MRI brain W/W0 contrast;Abnormality at the inferior frontal lobe/medial temporal lobe on the right with a region of increased T2 and FLAIR signal and a 9 mm focus of contrast enhancement. Differential diagnosis is  late subacute infarction versus demyelinating disease versus mass. Based on the spinal cord abnormalities shown yesterday, demyelinating disease is favored. A focus of cerebritis seems least likely. Neuro sarcoid is also being considered in the clinical differential diagnosis of the spinal disease, which could  possibly be consistent with this brain lesion.  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 7/6 CT chest W0 contrast;No definite thoracic adenopathy. -5 x 10 mm RIGHT middle lobe nodule image 69; consider one of the following in 3 months for both low-risk and high-risk individuals: (a) repeat chest CT,    I have personally reviewed and interpreted all radiology studies and my findings are as above.  VENTILATOR SETTINGS:    Cultures 7/1 CSF negative VDRL 7/1 CSF negative final  7/1 blood HSV 1/2  Negative 7/1 urine negative final 7/5 blood RPR negative  Antimicrobials:    Devices    LINES / TUBES:      Continuous Infusions: . therapeutic plasma exchange solution    . calcium gluconate    . citrate dextrose       Objective: Vitals:   08/16/19 2000 08/16/19 2020 08/17/19 0020 08/17/19 0534  BP: (!) 156/73 (!) 156/69 (!) 122/58 (!) 141/64  Pulse: (!) 52 (!) 55 (!) 58 (!) 51  Resp: 19 18 16 16   Temp:  98 F (36.7 C) 97.8 F (36.6 C) 97.8 F (36.6 C)  TempSrc:  Oral Oral Oral  SpO2:  100% 99% 100%  Weight:      Height:        Intake/Output Summary (Last 24 hours) at 08/17/2019 5400 Last data filed at 08/17/2019 0500 Gross per 24 hour  Intake 1000 ml  Output --  Net 1000 ml   Filed Weights   08/14/19 1900  Weight: 127 kg    Examination:  General: A/O x4, No acute respiratory distress Eyes: negative scleral hemorrhage, negative anisocoria, negative icterus ENT: Negative Runny nose, negative gingival bleeding, Neck:  Negative scars, masses, torticollis, lymphadenopathy, JVD Lungs: Clear to auscultation bilaterally without wheezes or crackles Cardiovascular: Regular rate and rhythm without murmur gallop or rub normal S1 and S2 Abdomen: MORBIDLY OBESE, negative abdominal pain, nondistended, positive soft, bowel sounds, no rebound, no ascites, no appreciable mass Extremities: No significant cyanosis,  clubbing, or edema bilateral lower extremities Skin: Negative rashes, lesions, ulcers Psychiatric:  Negative depression, negative anxiety, negative fatigue, negative mania  Central nervous system:  Cranial nerves II through XII intact, tongue/uvula midline, all extremities muscle strength 4/5 greatest on the left upper/left lower extremities.,  Loss sensation bilateral lower extremities, but feels she is starting to get slight feeling left lower extremity.  , finger nose finger bilateral difficult LEFT >> RIGHT, unable to perform  quick finger touch bilateral correctly.  negative dysarthria, negative expressive aphasia, negative receptive aphasia.  .     Data Reviewed: Care during the described time interval was provided by me .  I have reviewed this patient's available data, including medical history, events of note, physical examination, and all test results as part of my evaluation.  CBC: Recent Labs  Lab 08/10/19 1543 08/11/19 0234 08/13/19 0232 08/14/19 0556 08/15/19 0703 08/16/19 0503 08/17/19 0413  WBC 8.9   < > 11.1* 11.5* 10.7* 10.8* 9.1  NEUTROABS 6.1  --   --   --   --   --  6.9  HGB 12.5   < > 11.8* 11.4* 13.0 12.7 12.0  HCT 37.9   < >  35.3* 34.5* 39.1 38.1 36.6  MCV 93.1   < > 90.3 90.3 90.5 90.1 91.7  PLT 283   < > 251 263 292 284 245   < > = values in this interval not displayed.   Basic Metabolic Panel: Recent Labs  Lab 08/11/19 0234 08/13/19 0232 08/14/19 0556 08/15/19 0703 08/16/19 0503 08/16/19 1544 08/17/19 0413  NA 137   < > 137 136 134* 134* 137  K 3.4*   < > 3.3* 3.5 3.1* 3.0* 3.2*  CL 103   < > 102 98 99 101 103  CO2 22   < > 24 23 23  21* 27  GLUCOSE 97   < > 139* 192* 124* 113* 102*  BUN 14   < > 25* 26* 28* 29* 26*  CREATININE 1.01*   < > 1.02* 1.25* 1.07* 1.19* 1.11*  CALCIUM 9.2   < > 9.1 9.2 9.0 8.9 8.3*  MG 1.8  --   --   --   --   --  2.2  PHOS 3.1  --   --   --   --   --  3.8   < > = values in this interval not displayed.    GFR: Estimated Creatinine Clearance: 66.9 mL/min (A) (by C-G formula based on SCr of 1.11 mg/dL (H)). Liver Function Tests: Recent Labs  Lab 08/10/19 2356 08/11/19 0234 08/17/19 0413  AST  --  20 21  ALT  --  16 24  ALKPHOS  --  58 29*  BILITOT  --  0.8 0.8  PROT  --  6.1* 4.9*  ALBUMIN 4.0 3.7 3.6   No results for input(s): LIPASE, AMYLASE in the last 168 hours. No results for input(s): AMMONIA in the last 168 hours. Coagulation Profile: Recent Labs  Lab 08/11/19 0400  INR 1.1   Cardiac Enzymes: No results for input(s): CKTOTAL, CKMB, CKMBINDEX, TROPONINI in the last 168 hours. BNP (last 3 results) No results for input(s): PROBNP in the last 8760 hours. HbA1C: No results for input(s): HGBA1C in the last 72 hours. CBG: No results for input(s): GLUCAP in the last 168 hours. Lipid Profile: Recent Labs    08/17/19 0413  CHOL 79  HDL 22*  LDLCALC 29  TRIG 141  CHOLHDL 3.6   Thyroid Function Tests: No results for input(s): TSH, T4TOTAL, FREET4, T3FREE, THYROIDAB in the last 72 hours. Anemia Panel: No results for input(s): VITAMINB12, FOLATE, FERRITIN, TIBC, IRON, RETICCTPCT in the last 72 hours. Sepsis Labs: No results for input(s): PROCALCITON, LATICACIDVEN in the last 168 hours.  Recent Results (from the past 240 hour(s))  SARS Coronavirus 2 by RT PCR (hospital order, performed in [facility] hospital lab) Nasopharyngeal Nasopharyngeal Swab     Status: None   Collection Time: 08/10/19  3:43 PM   Specimen: Nasopharyngeal Swab  Result Value Ref Range Status   SARS Coronavirus 2 NEGATIVE NEGATIVE Final    Comment: (NOTE) SARS-CoV-2 target nucleic acids are NOT DETECTED.  The SARS-CoV-2 RNA is generally detectable in upper and lower respiratory specimens during the acute phase of infection. The lowest concentration of SARS-CoV-2 viral copies this assay can detect is 250 copies / mL. A negative result does not preclude SARS-CoV-2 infection and should not be  used as the sole basis for treatment or other patient management decisions.  A negative result may occur with improper specimen collection / handling, submission of specimen other than nasopharyngeal swab, presence of viral mutation(s) within the areas targeted by  this assay, and inadequate number of viral copies (<250 copies / mL). A negative result must be combined with clinical observations, patient history, and epidemiological information.  Fact Sheet for Patients:   StrictlyIdeas.no  Fact Sheet for Healthcare Providers: BankingDealers.co.za  This test is not yet approved or  cleared by the [place] FDA and has been authorized for detection and/or diagnosis of SARS-CoV-2 by FDA under an Emergency Use Authorization (EUA).  This EUA will remain in effect (meaning this test can be used) for the duration of the COVID-19 declaration under Section 564(b)(1) of the Act, 21 U.S.C. section 360bbb-3(b)(1), unless the authorization is terminated or revoked sooner.  Performed at [facility], [address] [place], [place] [address]   Urine Culture     Status: None   Collection Time: 08/10/19  7:05 PM   Specimen: Urine, Random  Result Value Ref Range Status   Specimen Description URINE, RANDOM  Final   Special Requests NONE  Final   Culture   Final    NO GROWTH Performed at [facility], [place] [address] [place], [place] [address]    Report Status 08/12/2019 FINAL  Final  CSF culture with Stat gram stain     Status: None   Collection Time: 08/10/19  9:46 PM   Specimen: CSF; Cerebrospinal Fluid  Result Value Ref Range Status   Specimen Description CSF  Final   Special Requests NONE  Final   Gram Stain   Final    WBC PRESENT,BOTH PMN AND MONONUCLEAR NO ORGANISMS SEEN CYTOSPIN SMEAR    Culture   Final    NO GROWTH Performed at [facility], [place] [address], [place], [place] [address]    Report Status  08/14/2019 FINAL  Final         Radiology Studies: CT CHEST WO CONTRAST  Result Date: 08/15/2019 CLINICAL DATA:  Sarcoidosis, CNS disease, question adenopathy, history hypertension, will pink cough, sleep apnea, stage III chronic kidney disease, hypertension, GERD EXAM: CT CHEST WITHOUT CONTRAST TECHNIQUE: Multidetector CT imaging of the chest was performed following the standard protocol without IV contrast. Sagittal and coronal MPR images reconstructed from axial data set. COMPARISON:  07/16/2011 FINDINGS: Cardiovascular: Aorta normal caliber. Heart unremarkable. No pericardial effusion. Mediastinum/Nodes: Esophagus normal appearance. Base of cervical region normal appearance. No enlarged mediastinal or axillary lymph nodes identified. Suboptimal assessment of hila for adenopathy due to lack of IV contrast, no gross mass seen. Lungs/Pleura: New 5 x 10 mm RIGHT middle lobe nodule image 69. Minimal subsegmental atelectasis in lower lobes. Lungs otherwise clear. No infiltrate, pleural effusion or pneumothorax. Upper Abdomen: Visualized upper abdomen unremarkable Musculoskeletal: BILATERAL shoulder prostheses. Prior cervical spine fusion. No acute osseous findings. IMPRESSION: No definite thoracic adenopathy. 5 x 10 mm RIGHT middle lobe nodule image 69; consider one of the following in 3 months for both low-risk and high-risk individuals: (a) repeat chest CT, (b) follow-up PET-CT, or (c) tissue sampling. This recommendation follows the consensus statement: Guidelines for Management of Incidental Pulmonary Nodules Detected on CT Images: From the Fleischner Society 2017; Radiology 2017; 284:228-243. Electronically Signed   By: Lavonia Dana M.D.   On: 08/15/2019 16:29   DG CHEST PORT 1 VIEW  Result Date: 08/16/2019 CLINICAL DATA:  Central line placement. EXAM: PORTABLE CHEST 1 VIEW COMPARISON:  Chest CT earlier today. FINDINGS: Tip of the right internal jugular central venous catheter projects over the mid  SVC. No pneumothorax. Upper normal heart size with normal  mediastinal contours. No focal airspace disease, pulmonary edema, or pleural effusion. The right middle lobe nodule on CT is not well demonstrated by radiograph. Postsurgical change in the lower cervical spine and both shoulders. Bone island in the posterior left fifth rib. IMPRESSION: 1. Tip of the right internal jugular central venous catheter projects over the mid SVC. No pneumothorax. 2. Right middle lobe pulmonary nodule on CT is not well-defined by radiograph. Electronically Signed   By: Keith Rake M.D.   On: 08/16/2019 15:30   VAS [place] LOWER EXTREMITY VENOUS (DVT)  Result Date: 08/16/2019  Lower Venous DVTStudy Indications: Pain, and Edema.  Limitations: Pain tolerance. Comparison Study: no prior Performing Technologist: Abram Sander RVS  Examination Guidelines: A complete evaluation includes B-mode imaging, spectral Doppler, color Doppler, and power Doppler as needed of all accessible portions of each vessel. Bilateral testing is considered an integral part of a complete examination. Limited examinations for reoccurring indications may be performed as noted. The reflux portion of the exam is performed with the patient in reverse Trendelenburg.  +---------+---------------+---------+-----------+----------+-------------------+ RIGHT    CompressibilityPhasicitySpontaneityPropertiesThrombus Aging      +---------+---------------+---------+-----------+----------+-------------------+ CFV      Full           Yes      Yes                                      +---------+---------------+---------+-----------+----------+-------------------+ SFJ      Full                                                             +---------+---------------+---------+-----------+----------+-------------------+ FV Prox  Full                                                              +---------+---------------+---------+-----------+----------+-------------------+ FV Mid                  Yes      Yes                  unable to compress                                                        due to pain                                                               tolerance           +---------+---------------+---------+-----------+----------+-------------------+ FV Distal               Yes      Yes  unable to compress                                                        due to pain                                                               tolerance           +---------+---------------+---------+-----------+----------+-------------------+ PFV      Full                                                             +---------+---------------+---------+-----------+----------+-------------------+ POP      Full           Yes      Yes                                      +---------+---------------+---------+-----------+----------+-------------------+ PTV      Full                                                             +---------+---------------+---------+-----------+----------+-------------------+ PERO     Full                                                             +---------+---------------+---------+-----------+----------+-------------------+   +---------+---------------+---------+-----------+----------+-------------------+ LEFT     CompressibilityPhasicitySpontaneityPropertiesThrombus Aging      +---------+---------------+---------+-----------+----------+-------------------+ CFV      Full           Yes      Yes                                      +---------+---------------+---------+-----------+----------+-------------------+ SFJ      Full                                                             +---------+---------------+---------+-----------+----------+-------------------+ FV  Prox  Full           Yes      Yes                  unable to compress  due to pain                                                               tolerance           +---------+---------------+---------+-----------+----------+-------------------+ FV Mid                  Yes      Yes                  unable to compress                                                        due to pain                                                               tolerance           +---------+---------------+---------+-----------+----------+-------------------+ FV DistalFull                                                             +---------+---------------+---------+-----------+----------+-------------------+ PFV      Full                                                             +---------+---------------+---------+-----------+----------+-------------------+ POP      Full           Yes      Yes                                      +---------+---------------+---------+-----------+----------+-------------------+ PTV      Full                                                             +---------+---------------+---------+-----------+----------+-------------------+ PERO     Full                                                             +---------+---------------+---------+-----------+----------+-------------------+     Summary: BILATERAL: - No evidence of deep vein thrombosis seen in the lower  extremities, bilaterally. -No evidence of popliteal cyst, bilaterally.   *See table(s) above for measurements and observations. Electronically signed by Harold Barban MD on 08/16/2019 at 5:11:28 PM.    Final         Scheduled Meds: . calcium carbonate  2 tablet Oral Q3H  . carvedilol  6.25 mg Oral BID  . Chlorhexidine Gluconate Cloth  6 each Topical Daily  . citalopram  20 mg Oral QHS  . enoxaparin  (LOVENOX) injection  40 mg Subcutaneous Q24H  . heparin sodium (porcine)  1,000 Units Intracatheter Once  . lubiprostone  24 mcg Oral BID  . pantoprazole sodium  40 mg Oral Daily  . pramipexole  1.5 mg Oral QHS  . senna-docusate  1 tablet Oral BID   Continuous Infusions: . therapeutic plasma exchange solution    . calcium gluconate    . citrate dextrose       LOS: 6 days    Time spent:40 min    Ilah Boule, Geraldo Docker, MD [facility] Pager [phone]  If 7PM-7AM, please contact night-coverage [email or web address] Password [email or web address] 08/17/2019, 8:07 AM

## 2019-08-18 ENCOUNTER — Ambulatory Visit: Payer: BC Managed Care – PPO | Admitting: Physician Assistant

## 2019-08-18 LAB — CBC WITH DIFFERENTIAL/PLATELET
Abs Immature Granulocytes: 0.2 10*3/uL — ABNORMAL HIGH (ref 0.00–0.07)
Basophils Absolute: 0 10*3/uL (ref 0.0–0.1)
Basophils Relative: 0 %
Eosinophils Absolute: 0.4 10*3/uL (ref 0.0–0.5)
Eosinophils Relative: 4 %
HCT: 38.3 % (ref 36.0–46.0)
Hemoglobin: 12.5 g/dL (ref 12.0–15.0)
Immature Granulocytes: 2 %
Lymphocytes Relative: 24 %
Lymphs Abs: 2.5 10*3/uL (ref 0.7–4.0)
MCH: 29.9 pg (ref 26.0–34.0)
MCHC: 32.6 g/dL (ref 30.0–36.0)
MCV: 91.6 fL (ref 80.0–100.0)
Monocytes Absolute: 0.8 10*3/uL (ref 0.1–1.0)
Monocytes Relative: 7 %
Neutro Abs: 6.6 10*3/uL (ref 1.7–7.7)
Neutrophils Relative %: 63 %
Platelets: 236 10*3/uL (ref 150–400)
RBC: 4.18 MIL/uL (ref 3.87–5.11)
RDW: 12.8 % (ref 11.5–15.5)
WBC: 10.5 10*3/uL (ref 4.0–10.5)
nRBC: 0 % (ref 0.0–0.2)

## 2019-08-18 LAB — COMPREHENSIVE METABOLIC PANEL
ALT: 22 U/L (ref 0–44)
AST: 16 U/L (ref 15–41)
Albumin: 3.6 g/dL (ref 3.5–5.0)
Alkaline Phosphatase: 36 U/L — ABNORMAL LOW (ref 38–126)
Anion gap: 8 (ref 5–15)
BUN: 18 mg/dL (ref 8–23)
CO2: 26 mmol/L (ref 22–32)
Calcium: 8.3 mg/dL — ABNORMAL LOW (ref 8.9–10.3)
Chloride: 103 mmol/L (ref 98–111)
Creatinine, Ser: 0.92 mg/dL (ref 0.44–1.00)
GFR calc Af Amer: >60 mL/min (ref >60)
GFR calc non Af Amer: >60 mL/min (ref >60)
Glucose, Bld: 102 mg/dL — ABNORMAL HIGH (ref 70–99)
Potassium: 3.1 mmol/L — ABNORMAL LOW (ref 3.5–5.1)
Sodium: 137 mmol/L (ref 135–145)
Total Bilirubin: 0.7 mg/dL (ref 0.3–1.2)
Total Protein: 5.3 g/dL — ABNORMAL LOW (ref 6.5–8.1)

## 2019-08-18 LAB — BASIC METABOLIC PANEL
Anion gap: 9 (ref 5–15)
BUN: 19 mg/dL (ref 8–23)
CO2: 19 mmol/L — ABNORMAL LOW (ref 22–32)
Calcium: 7.9 mg/dL — ABNORMAL LOW (ref 8.9–10.3)
Chloride: 106 mmol/L (ref 98–111)
Creatinine, Ser: 1.2 mg/dL — ABNORMAL HIGH (ref 0.44–1.00)
GFR calc Af Amer: 55 mL/min — ABNORMAL LOW (ref >60)
GFR calc non Af Amer: 47 mL/min — ABNORMAL LOW (ref >60)
Glucose, Bld: 251 mg/dL — ABNORMAL HIGH (ref 70–99)
Potassium: 4 mmol/L (ref 3.5–5.1)
Sodium: 134 mmol/L — ABNORMAL LOW (ref 135–145)

## 2019-08-18 LAB — MAGNESIUM
Magnesium: 2.2 mg/dL (ref 1.7–2.4)
Magnesium: 1.7 mg/dL (ref 1.7–2.4)

## 2019-08-18 LAB — PHOSPHORUS: Phosphorus: 2.8 mg/dL (ref 2.5–4.6)

## 2019-08-18 LAB — NEUROMYELITIS OPTICA AUTOAB, IGG: NMO-IgG: <1.5 U/mL (ref 0.0–3.0)

## 2019-08-18 MED ORDER — CALCIUM GLUCONATE-NACL 2-0.675 GM/100ML-% IV SOLN
2.0000 g | Freq: Once | INTRAVENOUS | Status: AC
  Administered 2019-08-19: 2000 mg via INTRAVENOUS
  Filled 2019-08-18 (×2): qty 100

## 2019-08-18 MED ORDER — HEPARIN SODIUM (PORCINE) 1000 UNIT/ML IJ SOLN
1000.0000 [IU] | Freq: Once | INTRAMUSCULAR | Status: DC
  Administered 2019-08-19: 1000 [IU]
  Filled 2019-08-18: qty 1

## 2019-08-18 MED ORDER — HEPARIN SODIUM (PORCINE) 1000 UNIT/ML IJ SOLN
1000.0000 [IU] | Freq: Once | INTRAMUSCULAR | Status: AC
  Administered 2019-08-18: 1000 [IU]

## 2019-08-18 MED ORDER — CALCIUM CARBONATE ANTACID 500 MG PO CHEW
CHEWABLE_TABLET | ORAL | Status: AC
  Filled 2019-08-18: qty 2

## 2019-08-18 MED ORDER — POTASSIUM CHLORIDE CRYS ER 20 MEQ PO TBCR
20.0000 meq | EXTENDED_RELEASE_TABLET | Freq: Once | ORAL | Status: AC
  Administered 2019-08-18: 20 meq via ORAL
  Filled 2019-08-18: qty 1

## 2019-08-18 MED ORDER — DIPHENHYDRAMINE HCL 25 MG PO CAPS
25.0000 mg | ORAL_CAPSULE | Freq: Four times a day (QID) | ORAL | Status: DC | PRN
  Administered 2019-08-19: 25 mg via ORAL

## 2019-08-18 MED ORDER — CALCIUM CARBONATE ANTACID 500 MG PO CHEW
2.0000 | CHEWABLE_TABLET | ORAL | Status: DC
  Administered 2019-08-19: 400 mg via ORAL

## 2019-08-18 MED ORDER — PANTOPRAZOLE SODIUM 40 MG PO TBEC
40.0000 mg | DELAYED_RELEASE_TABLET | Freq: Every day | ORAL | Status: DC
  Administered 2019-08-19 – 2019-08-24 (×6): 40 mg via ORAL
  Filled 2019-08-18 (×6): qty 1

## 2019-08-18 MED ORDER — ENOXAPARIN SODIUM 60 MG/0.6ML ~~LOC~~ SOLN
60.0000 mg | SUBCUTANEOUS | Status: DC
  Administered 2019-08-18 – 2019-08-23 (×6): 60 mg via SUBCUTANEOUS
  Filled 2019-08-18 (×6): qty 0.6

## 2019-08-18 MED ORDER — ACD FORMULA A 0.73-2.45-2.2 GM/100ML VI SOLN: 1000.0000 mL | Status: DC

## 2019-08-18 MED ORDER — CALCIUM GLUCONATE-NACL 2-0.675 GM/100ML-% IV SOLN
2.0000 g | Freq: Once | INTRAVENOUS | Status: AC
  Administered 2019-08-18: 2000 mg via INTRAVENOUS
  Filled 2019-08-18: qty 100

## 2019-08-18 MED ORDER — POTASSIUM CHLORIDE 10 MEQ/100ML IV SOLN
10.0000 meq | INTRAVENOUS | Status: DC
  Administered 2019-08-18 (×2): 10 meq via INTRAVENOUS
  Filled 2019-08-18 (×2): qty 100

## 2019-08-18 MED ORDER — POTASSIUM CHLORIDE CRYS ER 20 MEQ PO TBCR
50.0000 meq | EXTENDED_RELEASE_TABLET | Freq: Once | ORAL | Status: AC
  Administered 2019-08-18: 50 meq via ORAL
  Filled 2019-08-18: qty 2

## 2019-08-18 MED ORDER — SODIUM CHLORIDE 0.9 % IV SOLN
INTRAVENOUS | Status: AC
  Filled 2019-08-18 (×4): qty 200

## 2019-08-18 MED ORDER — ACETAMINOPHEN 325 MG PO TABS
650.0000 mg | ORAL_TABLET | ORAL | Status: DC | PRN
  Administered 2019-08-18: 650 mg via ORAL

## 2019-08-18 MED ORDER — HEPARIN SODIUM (PORCINE) 1000 UNIT/ML IJ SOLN
INTRAMUSCULAR | Status: AC
  Filled 2019-08-18: qty 4

## 2019-08-18 MED ORDER — ACETAMINOPHEN 325 MG PO TABS
650.0000 mg | ORAL_TABLET | ORAL | Status: DC | PRN
  Administered 2019-08-19: 650 mg via ORAL

## 2019-08-18 MED ORDER — POTASSIUM CHLORIDE 10 MEQ/100ML IV SOLN: 10.0000 meq | INTRAVENOUS | Status: AC

## 2019-08-18 MED ORDER — DIPHENHYDRAMINE HCL 25 MG PO CAPS
ORAL_CAPSULE | ORAL | Status: AC
  Filled 2019-08-18: qty 1

## 2019-08-18 MED ORDER — ACETAMINOPHEN 325 MG PO TABS
ORAL_TABLET | ORAL | Status: AC
  Filled 2019-08-18: qty 2

## 2019-08-18 MED ORDER — ACD FORMULA A 0.73-2.45-2.2 GM/100ML VI SOLN
1000.0000 mL | Status: DC
  Administered 2019-08-19: 1000 mL
  Filled 2019-08-18: qty 1000

## 2019-08-18 MED ORDER — CALCIUM CARBONATE ANTACID 500 MG PO CHEW
2.0000 | CHEWABLE_TABLET | ORAL | Status: DC
  Administered 2019-08-18: 400 mg via ORAL
  Filled 2019-08-18: qty 2

## 2019-08-18 MED ORDER — LUBIPROSTONE 24 MCG PO CAPS
24.0000 ug | ORAL_CAPSULE | Freq: Two times a day (BID) | ORAL | Status: DC
  Administered 2019-08-18 – 2019-08-24 (×12): 24 ug via ORAL
  Filled 2019-08-18 (×13): qty 1

## 2019-08-18 MED ORDER — DIPHENHYDRAMINE HCL 25 MG PO CAPS
25.0000 mg | ORAL_CAPSULE | Freq: Four times a day (QID) | ORAL | Status: DC | PRN
  Administered 2019-08-18: 25 mg via ORAL

## 2019-08-18 NOTE — Progress Notes (Signed)
PROGRESS NOTE    Connie Haynes  PNT:[id number] DOB: 12-14-1955 DOA: 08/10/2019 PCP: [name], MD     Brief Narrative:  64 y.o.WF PMHx CKD stage III, adrenal insufficiency, history of spinal cord stroke with no residual deficitsS/pneck surgery(about 10 years ago), morbid obesity, HTN, HLD,   Presents to the emergency department due to 1 week onset of bilateral lower extremity numbness and weakness as well as urinary retention. CSF Prelim results not suggestive of infectious etiology. Not convincing for inflammatory etiology either. Per neuro an longitudinally extensive inflammatory myelitis is the most likely etiology for the patient's dorsal spinal cord lesion with patchy enhancement on MRI.   Currently plan is continue IV steroids.   Subjective: 7/9 A/O x4, negative CP, negative abdominal pain.  Currently s/p 2d session of plasma exchange.  Feels cold   Assessment & Plan: Covid vaccination; positive vaccination   Principal Problem:   Transverse myelitis ([place]) Active Problems:   Essential hypertension, benign   Morbid obesity ([place])   Adrenal insufficiency (Addison's disease) ([place])   Transverse myelitis -Neuromyelitis antibody positive -7/1 s/p LP; negative for infection. -ACElevels,ANA, RF,SSA, SSB,B12, DsDNA, neuromyelitis optica autoantibodyIgGand anti-Jo 1 antibody were ordered.  Pending -Neuro started the pt on high dose 1gm IV solumedrol x 5 days. -7/2 MRI Brain shows right sided inferior frontal lobe/medial temporal lobe increased T2 and FLAIR signal and a 9 mm focus of contrast enhancement suggestive of subacute infarction versus demyelinating disease versus mass. But based on the spinal cord abnormalities shown yesterday, demyelinating disease is favored. -Fall precautions -7/7 s/p.  Plasma exchange x1.  Today will be a holiday, and plan is to restart tomorrow -7/9 plasma exchange x2  Essential hypertension -Coreg BID -Hydralazine  PRN  GERD -Continue Protonix  Adrenal insufficiency -Hold home hydrocortisone while on IV steroids.  -7/8 continue to hold home Solu-Cortef, BP still slightly high will need to monitor closely.  .   Morbid obesity -Outpatient weight 122kg.   Anxiety and tremors -Citalopram 20 mg daily -Lorazepam 0.5 mg  PRN   HLD  -7/8 LDL= 29  H/o CVA No residual deficit  Monitor   OSA -CPAP per respiratory  Restless leg syndrome -Pramipexole 1.5 mg daily   CKD stage II Recent Labs  Lab 08/15/19 0703 08/16/19 0503 08/16/19 1544 08/17/19 0413 08/18/19 0349  CREATININE 1.25* 1.07* 1.19* 1.11* 0.92  -Follows up with nephrology as outpatient  -Stable  Hypokalemia -K-Dur 50 mEq -Potassium IV 40 mEq -Recheck K/Mg 1600  Goals of care -PT/OT recommends home health.  Will have them reevaluate once course of plasma exchange has been completed   DVT prophylaxis: Lovenox Code Status: Full Family Communication: 7/7 husband at bedside for discussion of plan of care Status is: Inpatient    Dispo: The patient is from: Home              Anticipated d/c is to: Home              Anticipated d/c date is: Neurology              Patient currently unstable      Consultants:  Neurology   Procedures/Significant Events:  7/1 s/p LP 7/1 MRI C-spine, T-spine, L-spine W0 contrast;-Abnormal cord signal extending superiorly from the T3-T4 level into the cervical spine -Multilevel degenerative changes. 7/2 LP.  Opening pressure 20 cm H2O 7/2 MRI brain W/W0 contrast;Abnormality at the inferior frontal lobe/medial temporal lobe on the right with a region of increased T2 and  FLAIR signal and a 9 mm focus of contrast enhancement. Differential diagnosis is late subacute infarction versus demyelinating disease versus mass. Based on the spinal cord abnormalities shown yesterday, demyelinating disease is favored. A focus of cerebritis seems least likely. Neuro sarcoid is also being  considered in the clinical differential diagnosis of the spinal disease, which could possibly be consistent with this brain lesion.  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 7/6 CT chest W0 contrast;No definite thoracic adenopathy. -5 x 10 mm RIGHT middle lobe nodule image 69; consider one of the following in 3 months for both low-risk and high-risk individuals: (a) repeat chest CT,    I have personally reviewed and interpreted all radiology studies and my findings are as above.  VENTILATOR SETTINGS:    Cultures 7/1 CSF negative VDRL 7/1 CSF negative final  7/1 blood HSV 1/2  Negative 7/1 urine negative final 7/5 blood RPR negative  Antimicrobials:    Devices    LINES / TUBES:      Continuous Infusions: . therapeutic plasma exchange solution    . calcium gluconate    . citrate dextrose       Objective: Vitals:   08/17/19 1237 08/17/19 1809 08/18/19 0016 08/18/19 0535  BP: 125/62 (!) 141/58 (!) 110/54 (!) 150/52  Pulse: 68 63 62 63  Resp: 17 17 16 17   Temp: 98 F (36.7 C) 97.9 F (36.6 C) 97.9 F (36.6 C) 98 F (36.7 C)  TempSrc: Oral Oral Oral Oral  SpO2: 99% 96% 98% 97%  Weight:      Height:        Intake/Output Summary (Last 24 hours) at 08/18/2019 0109 Last data filed at 08/18/2019 0800 Gross per 24 hour  Intake 240 ml  Output --  Net 240 ml   Filed Weights   08/14/19 1900  Weight: 127 kg    Examination:  General: A/O x4, No acute respiratory distress Eyes: negative scleral hemorrhage, negative anisocoria, negative icterus ENT: Negative Runny nose, negative gingival bleeding, Neck:  Negative scars, masses, torticollis, lymphadenopathy, JVD Lungs: Clear to auscultation bilaterally without wheezes or crackles Cardiovascular: Regular rate and rhythm without murmur gallop or rub normal S1 and S2 Abdomen: MORBIDLY OBESE, negative abdominal pain, nondistended, positive soft, bowel sounds, no  rebound, no ascites, no appreciable mass Extremities: No significant cyanosis, clubbing, or edema bilateral lower extremities Skin: Negative rashes, lesions, ulcers Psychiatric:  Negative depression, negative anxiety, negative fatigue, negative mania  Central nervous system:  Cranial nerves II through XII intact, tongue/uvula midline, all extremities muscle strength 4/5 greatest on the left upper/left lower extremities.,  Loss sensation bilateral lower extremities, but feels she is starting to get slight feeling left lower extremity.  , finger nose finger bilateral difficult LEFT >> RIGHT, unable to perform  quick finger touch bilateral correctly.  negative dysarthria, negative expressive aphasia, negative receptive aphasia.  .     Data Reviewed: Care during the described time interval was provided by me .  I have reviewed this patient's available data, including medical history, events of note, physical examination, and all test results as part of my evaluation.  CBC: Recent Labs  Lab 08/14/19 0556 08/15/19 0703 08/16/19 0503 08/17/19 0413 08/18/19 0349  WBC 11.5* 10.7* 10.8* 9.1 10.5  NEUTROABS  --   --   --  6.9 6.6  HGB 11.4* 13.0 12.7 12.0 12.5  HCT 34.5* 39.1 38.1 36.6 38.3  MCV 90.3 90.5 90.1 91.7 91.6  PLT  263 292 284 245 325   Basic Metabolic Panel: Recent Labs  Lab 08/15/19 0703 08/16/19 0503 08/16/19 1544 08/17/19 0413 08/18/19 0349  NA 136 134* 134* 137 137  K 3.5 3.1* 3.0* 3.2* 3.1*  CL 98 99 101 103 103  CO2 23 23 21* 27 26  GLUCOSE 192* 124* 113* 102* 102*  BUN 26* 28* 29* 26* 18  CREATININE 1.25* 1.07* 1.19* 1.11* 0.92  CALCIUM 9.2 9.0 8.9 8.3* 8.3*  MG  --   --   --  2.2 2.2  PHOS  --   --   --  3.8 2.8   GFR: Estimated Creatinine Clearance: 80.7 mL/min (by C-G formula based on SCr of 0.92 mg/dL). Liver Function Tests: Recent Labs  Lab 08/17/19 0413 08/18/19 0349  AST 21 16  ALT 24 22  ALKPHOS 29* 36*  BILITOT 0.8 0.7  PROT 4.9* 5.3*  ALBUMIN  3.6 3.6   No results for input(s): LIPASE, AMYLASE in the last 168 hours. No results for input(s): AMMONIA in the last 168 hours. Coagulation Profile: No results for input(s): INR, PROTIME in the last 168 hours. Cardiac Enzymes: No results for input(s): CKTOTAL, CKMB, CKMBINDEX, TROPONINI in the last 168 hours. BNP (last 3 results) No results for input(s): PROBNP in the last 8760 hours. HbA1C: No results for input(s): HGBA1C in the last 72 hours. CBG: No results for input(s): GLUCAP in the last 168 hours. Lipid Profile: Recent Labs    08/17/19 0413  CHOL 79  HDL 22*  LDLCALC 29  TRIG 141  CHOLHDL 3.6   Thyroid Function Tests: No results for input(s): TSH, T4TOTAL, FREET4, T3FREE, THYROIDAB in the last 72 hours. Anemia Panel: No results for input(s): VITAMINB12, FOLATE, FERRITIN, TIBC, IRON, RETICCTPCT in the last 72 hours. Sepsis Labs: No results for input(s): PROCALCITON, LATICACIDVEN in the last 168 hours.  Recent Results (from the past 240 hour(s))  SARS Coronavirus 2 by RT PCR (hospital order, performed in [facility] hospital lab) Nasopharyngeal Nasopharyngeal Swab     Status: None   Collection Time: 08/10/19  3:43 PM   Specimen: Nasopharyngeal Swab  Result Value Ref Range Status   SARS Coronavirus 2 NEGATIVE NEGATIVE Final    Comment: (NOTE) SARS-CoV-2 target nucleic acids are NOT DETECTED.  The SARS-CoV-2 RNA is generally detectable in upper and lower respiratory specimens during the acute phase of infection. The lowest concentration of SARS-CoV-2 viral copies this assay can detect is 250 copies / mL. A negative result does not preclude SARS-CoV-2 infection and should not be used as the sole basis for treatment or other patient management decisions.  A negative result may occur with improper specimen collection / handling, submission of specimen other than nasopharyngeal swab, presence of viral mutation(s) within the areas targeted by this assay, and inadequate  number of viral copies (<250 copies / mL). A negative result must be combined with clinical observations, patient history, and epidemiological information.  Fact Sheet for Patients:   StrictlyIdeas.no  Fact Sheet for Healthcare Providers: BankingDealers.co.za  This test is not yet approved or  cleared by the [place] FDA and has been authorized for detection and/or diagnosis of SARS-CoV-2 by FDA under an Emergency Use Authorization (EUA).  This EUA will remain in effect (meaning this test can be used) for the duration of the COVID-19 declaration under Section 564(b)(1) of the Act, 21 U.S.C. section 360bbb-3(b)(1), unless the authorization is terminated or revoked sooner.  Performed at [facility], [address] [place], [place] [address]   Urine Culture     Status: None   Collection Time: 08/10/19  7:05 PM   Specimen: Urine, Random  Result Value Ref Range Status   Specimen Description URINE, RANDOM  Final   Special Requests NONE  Final   Culture   Final    NO GROWTH Performed at [facility], [place] [address], [place], [place] [address]    Report Status 08/12/2019 FINAL  Final  CSF culture with Stat gram stain     Status: None   Collection Time: 08/10/19  9:46 PM   Specimen: CSF; Cerebrospinal Fluid  Result Value Ref Range Status   Specimen Description CSF  Final   Special Requests NONE  Final   Gram Stain   Final    WBC PRESENT,BOTH PMN AND MONONUCLEAR NO ORGANISMS SEEN CYTOSPIN SMEAR    Culture   Final    NO GROWTH Performed at [facility], [place] [address], [place], [place] [address]    Report Status 08/14/2019 FINAL  Final         Radiology Studies: DG CHEST PORT 1 VIEW  Result Date: 08/16/2019 CLINICAL DATA:  Central line placement. EXAM: PORTABLE CHEST 1 VIEW COMPARISON:  Chest CT earlier today. FINDINGS: Tip of the right internal jugular central venous catheter projects over the  mid SVC. No pneumothorax. Upper normal heart size with normal mediastinal contours. No focal airspace disease, pulmonary edema, or pleural effusion. The right middle lobe nodule on CT is not well demonstrated by radiograph. Postsurgical change in the lower cervical spine and both shoulders. Bone island in the posterior left fifth rib. IMPRESSION: 1. Tip of the right internal jugular central venous catheter projects over the mid SVC. No pneumothorax. 2. Right middle lobe pulmonary nodule on CT is not well-defined by radiograph. Electronically Signed   By: Keith Rake M.D.   On: 08/16/2019 15:30   VAS [place] LOWER EXTREMITY VENOUS (DVT)  Result Date: 08/16/2019  Lower Venous DVTStudy Indications: Pain, and Edema.  Limitations: Pain tolerance. Comparison Study: no prior Performing Technologist: Abram Sander RVS  Examination Guidelines: A complete evaluation includes B-mode imaging, spectral Doppler, color Doppler, and power Doppler as needed of all accessible portions of each vessel. Bilateral testing is considered an integral part of a complete examination. Limited examinations for reoccurring indications may be performed as noted. The reflux portion of the exam is performed with the patient in reverse Trendelenburg.  +---------+---------------+---------+-----------+----------+-------------------+ RIGHT    CompressibilityPhasicitySpontaneityPropertiesThrombus Aging      +---------+---------------+---------+-----------+----------+-------------------+ CFV      Full           Yes      Yes                                      +---------+---------------+---------+-----------+----------+-------------------+ SFJ      Full                                                             +---------+---------------+---------+-----------+----------+-------------------+ FV Prox  Full                                                              +---------+---------------+---------+-----------+----------+-------------------+  FV Mid                  Yes      Yes                  unable to compress                                                        due to pain                                                               tolerance           +---------+---------------+---------+-----------+----------+-------------------+ FV Distal               Yes      Yes                  unable to compress                                                        due to pain                                                               tolerance           +---------+---------------+---------+-----------+----------+-------------------+ PFV      Full                                                             +---------+---------------+---------+-----------+----------+-------------------+ POP      Full           Yes      Yes                                      +---------+---------------+---------+-----------+----------+-------------------+ PTV      Full                                                             +---------+---------------+---------+-----------+----------+-------------------+ PERO     Full                                                             +---------+---------------+---------+-----------+----------+-------------------+   +---------+---------------+---------+-----------+----------+-------------------+  LEFT     CompressibilityPhasicitySpontaneityPropertiesThrombus Aging      +---------+---------------+---------+-----------+----------+-------------------+ CFV      Full           Yes      Yes                                      +---------+---------------+---------+-----------+----------+-------------------+ SFJ      Full                                                             +---------+---------------+---------+-----------+----------+-------------------+ FV  Prox  Full           Yes      Yes                  unable to compress                                                        due to pain                                                               tolerance           +---------+---------------+---------+-----------+----------+-------------------+ FV Mid                  Yes      Yes                  unable to compress                                                        due to pain                                                               tolerance           +---------+---------------+---------+-----------+----------+-------------------+ FV DistalFull                                                             +---------+---------------+---------+-----------+----------+-------------------+ PFV      Full                                                             +---------+---------------+---------+-----------+----------+-------------------+  POP      Full           Yes      Yes                                      +---------+---------------+---------+-----------+----------+-------------------+ PTV      Full                                                             +---------+---------------+---------+-----------+----------+-------------------+ PERO     Full                                                             +---------+---------------+---------+-----------+----------+-------------------+     Summary: BILATERAL: - No evidence of deep vein thrombosis seen in the lower extremities, bilaterally. -No evidence of popliteal cyst, bilaterally.   *See table(s) above for measurements and observations. Electronically signed by Harold Barban MD on 08/16/2019 at 5:11:28 PM.    Final         Scheduled Meds: . calcium carbonate  2 tablet Oral Q3H  . carvedilol  6.25 mg Oral BID  . Chlorhexidine Gluconate Cloth  6 each Topical Daily  . citalopram  20 mg Oral QHS  . enoxaparin  (LOVENOX) injection  60 mg Subcutaneous Q24H  . heparin sodium (porcine)  1,000 Units Intracatheter Once  . lubiprostone  24 mcg Oral BID  . pantoprazole sodium  40 mg Oral Daily  . pramipexole  1.5 mg Oral QHS  . senna-docusate  1 tablet Oral BID   Continuous Infusions: . therapeutic plasma exchange solution    . calcium gluconate    . citrate dextrose       LOS: 7 days    Time spent:40 min    Izela Altier, Geraldo Docker, MD [facility] Pager [phone]  If 7PM-7AM, please contact night-coverage [email or web address] Password [facility] 08/18/2019, 8:38 AM

## 2019-08-18 NOTE — Progress Notes (Signed)
Patient returned to room from HD. VSS. Call Pharmacy to restart PIV potassium due missed dose from this AM.

## 2019-08-18 NOTE — Progress Notes (Signed)
Physical Therapy Treatment Patient Details Name: Connie Haynes MRN: [id number] DOB: 1955-03-16 Today's Date: 08/18/2019    History of Present Illness Connie Haynes is a 64 y.o. 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  Undergoing further work up for transverse myelitis versus other deficiencies.    PT Comments    Pt making steady progress towards achieving her current functional mobility goals. She tolerated further gait training into hallway today, as well as an extended standing balance task at sink. Pt very encouraged overall by her progress at end of session. Pt would continue to benefit from skilled physical therapy services at this time while admitted and after d/c to address the below listed limitations in order to improve overall safety and independence with functional mobility.    Follow Up Recommendations  Outpatient PT     Equipment Recommendations  None recommended by PT    Recommendations for Other Services       Precautions / Restrictions Precautions Precautions: Fall Precaution Comments: decreased LE sensation (L>R) Restrictions Weight Bearing Restrictions: No    Mobility  Bed Mobility Overal bed mobility: Modified Independent                Transfers Overall transfer level: Needs assistance Equipment used: Rolling walker (2 wheeled) Transfers: Sit to/from Stand Sit to Stand: Supervision         General transfer comment: supervision with good technique utilized  Ambulation/Gait Ambulation/Gait assistance: Min guard;Min Web designer (Feet): 75 Feet Assistive device: Rolling walker (2 wheeled) Gait Pattern/deviations:  Step-through pattern;Decreased step length - right;Decreased step length - left;Decreased stride length Gait velocity: decreased   General Gait Details: pt with very slow, cautious and guarded gait with modest instability requiring close min guard with occasional min A for stability. Focused cueing more on a visual goal in regards to distance for ambulation versus her technique with walking which seemed to make her more anxious and caused her to lose her balance   Stairs             Wheelchair Mobility    Modified Rankin (Stroke Patients Only)       Balance Overall balance assessment: Needs assistance Sitting-balance support: Feet supported Sitting balance-Leahy Scale: Normal     Standing balance support: During functional activity;No upper extremity supported;Single extremity supported;Bilateral upper extremity supported Standing balance-Leahy Scale: Fair Standing balance comment: extended amount of time participating in a standing balance activity while completing an ADL task at the sink; statically pt's standing balance was fair without UE supports, dynamic was more poor                            Cognition Arousal/Alertness: Awake/alert Behavior During Therapy: WFL for tasks assessed/performed;Anxious Overall Cognitive Status: Within Functional Limits for tasks assessed                                        Exercises      General Comments        Pertinent Vitals/Pain Pain Assessment: No/denies pain    Home Living  Prior Function            PT Goals (current goals can now be found in the care plan section) Acute Rehab PT Goals PT Goal Formulation: With patient Time For Goal Achievement: 08/25/19 Potential to Achieve Goals: Good Progress towards PT goals: Progressing toward goals    Frequency    Min 3X/week      PT Plan Current plan remains appropriate    Co-evaluation               AM-PAC PT "6 Clicks" Mobility   Outcome Measure  Help needed turning from your back to your side while in a flat bed without using bedrails?: None Help needed moving from lying on your back to sitting on the side of a flat bed without using bedrails?: None Help needed moving to and from a bed to a chair (including a wheelchair)?: None Help needed standing up from a chair using your arms (e.g., wheelchair or bedside chair)?: None Help needed to walk in hospital room?: A Little Help needed climbing 3-5 steps with a railing? : A Lot 6 Click Score: 21    End of Session Equipment Utilized During Treatment: Gait belt Activity Tolerance: Patient tolerated treatment well Patient left: in bed;with call bell/phone within reach Nurse Communication: Mobility status PT Visit Diagnosis: Other abnormalities of gait and mobility (R26.89);Difficulty in walking, not elsewhere classified (R26.2);Other symptoms and signs involving the nervous system (R29.898)     Time: 4503-8882 PT Time Calculation (min) (ACUTE ONLY): 34 min  Charges:  $Gait Training: 8-22 mins $Therapeutic Activity: 8-22 mins                     Anastasio Champion, DPT  Acute Rehabilitation Services Pager [phone] Office [place] 08/18/2019, 10:11 AM

## 2019-08-18 NOTE — Progress Notes (Addendum)
Subjective: Still feels like she is having slight improvement.   Exam: Vitals:   08/18/19 1255 08/18/19 1355  BP: 123/61 122/82  Pulse: (!) 56 65  Resp: 17 18  Temp: 98 F (36.7 C) 98.2 F (36.8 C)  SpO2: 98% 98%   Gen: In bed, NAD Resp: non-labored breathing, no acute distress Abd: soft, nt  Neuro: MS: Awake, alert, interactive and appropriate CN: VFF, EOMI Motor: Though she does not give great effort, she has at least 4-/5 strength in all extremities, left foot with 1/5 dorsiflexion.  Sensory: Decreased to light touch from just above the knee downwards   Pertinent Labs: CSF WBC 4 CSF RBC 15 CSF Protein 48 CSF Glucose 49 CSF HSV negative  OC Bands - normal IgG index - normal CSF ACE - normal   Serum ACE - 52, normal RNP, SSA, SSB, SCL-70, ENA SM, ds DNA all normal Jo-1 normal RF normal B12 376 TSH normal Homocysteine - normal RPR negative Ceruloplasmin normal.   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  There is inflammation as evidenced by contrast enhancement, though no leukocytosis.  The combination of a supratentorial enhancing lesion as well as spinal enhancing lesion would suggest inflammatory process.  Certainly with longitudinal nature, neuromyelitis optica spectrum disorder(NOSD) would be a significant consideration.  Sarcoidosis could be a consideration as well, but no supportive findings have been found so I think this is less likely.   NOSD is treated typically with steroids and often PLEX, but with only a small response to steroids, I would favor rescue therapy with plasmapheresis for the transverse myelitis.    Recommendations: 1) neuromyelitis optica antibody, MOG antibody pending.  2) plasmapheresis treatment 1 7/7, tx # 2 today, then tomorrow, then qod for 5 treatments  Roland Rack, MD [facility] [phone]  If 7pm- 7am, please page neurology on call as listed in [place].

## 2019-08-18 NOTE — Progress Notes (Signed)
Patient left for HD at this time. Report given to HD RN Melissa.

## 2019-08-19 LAB — CBC WITH DIFFERENTIAL/PLATELET
Abs Immature Granulocytes: 0.25 10*3/uL — ABNORMAL HIGH (ref 0.00–0.07)
Basophils Absolute: 0 10*3/uL (ref 0.0–0.1)
Basophils Relative: 0 %
Eosinophils Absolute: 0 10*3/uL (ref 0.0–0.5)
Eosinophils Relative: 0 %
HCT: 38 % (ref 36.0–46.0)
Hemoglobin: 12.7 g/dL (ref 12.0–15.0)
Immature Granulocytes: 2 %
Lymphocytes Relative: 9 %
Lymphs Abs: 1.1 10*3/uL (ref 0.7–4.0)
MCH: 30.9 pg (ref 26.0–34.0)
MCHC: 33.4 g/dL (ref 30.0–36.0)
MCV: 92.5 fL (ref 80.0–100.0)
Monocytes Absolute: 0.3 10*3/uL (ref 0.1–1.0)
Monocytes Relative: 2 %
Neutro Abs: 11 10*3/uL — ABNORMAL HIGH (ref 1.7–7.7)
Neutrophils Relative %: 87 %
Platelets: 224 10*3/uL (ref 150–400)
RBC: 4.11 MIL/uL (ref 3.87–5.11)
RDW: 13.2 % (ref 11.5–15.5)
WBC: 12.7 10*3/uL — ABNORMAL HIGH (ref 4.0–10.5)
nRBC: 0 % (ref 0.0–0.2)

## 2019-08-19 LAB — BASIC METABOLIC PANEL
Anion gap: 10 (ref 5–15)
BUN: 17 mg/dL (ref 8–23)
CO2: 21 mmol/L — ABNORMAL LOW (ref 22–32)
Calcium: 8.3 mg/dL — ABNORMAL LOW (ref 8.9–10.3)
Chloride: 105 mmol/L (ref 98–111)
Creatinine, Ser: 0.97 mg/dL (ref 0.44–1.00)
GFR calc Af Amer: >60 mL/min (ref >60)
GFR calc non Af Amer: >60 mL/min (ref >60)
Glucose, Bld: 226 mg/dL — ABNORMAL HIGH (ref 70–99)
Potassium: 4.1 mmol/L (ref 3.5–5.1)
Sodium: 136 mmol/L (ref 135–145)

## 2019-08-19 LAB — COMPREHENSIVE METABOLIC PANEL
ALT: 12 U/L (ref 0–44)
AST: 11 U/L — ABNORMAL LOW (ref 15–41)
Albumin: 3.9 g/dL (ref 3.5–5.0)
Alkaline Phosphatase: 21 U/L — ABNORMAL LOW (ref 38–126)
Anion gap: 7 (ref 5–15)
BUN: 16 mg/dL (ref 8–23)
CO2: 22 mmol/L (ref 22–32)
Calcium: 8.1 mg/dL — ABNORMAL LOW (ref 8.9–10.3)
Chloride: 108 mmol/L (ref 98–111)
Creatinine, Ser: 0.87 mg/dL (ref 0.44–1.00)
GFR calc Af Amer: >60 mL/min (ref >60)
GFR calc non Af Amer: >60 mL/min (ref >60)
Glucose, Bld: 178 mg/dL — ABNORMAL HIGH (ref 70–99)
Potassium: 4.7 mmol/L (ref 3.5–5.1)
Sodium: 137 mmol/L (ref 135–145)
Total Bilirubin: 0.8 mg/dL (ref 0.3–1.2)
Total Protein: 4.8 g/dL — ABNORMAL LOW (ref 6.5–8.1)

## 2019-08-19 LAB — PHOSPHORUS: Phosphorus: 2.6 mg/dL (ref 2.5–4.6)

## 2019-08-19 LAB — MAGNESIUM: Magnesium: 2 mg/dL (ref 1.7–2.4)

## 2019-08-19 MED ORDER — CALCIUM CARBONATE ANTACID 500 MG PO CHEW
CHEWABLE_TABLET | ORAL | Status: AC
  Filled 2019-08-19: qty 2

## 2019-08-19 MED ORDER — ACETAMINOPHEN 325 MG PO TABS
ORAL_TABLET | ORAL | Status: AC
  Filled 2019-08-19: qty 2

## 2019-08-19 MED ORDER — HEPARIN SODIUM (PORCINE) 1000 UNIT/ML IJ SOLN
INTRAMUSCULAR | Status: AC
  Administered 2019-08-19: 3000 [IU]
  Filled 2019-08-19: qty 4

## 2019-08-19 MED ORDER — DIPHENHYDRAMINE HCL 25 MG PO CAPS
ORAL_CAPSULE | ORAL | Status: AC
  Filled 2019-08-19: qty 1

## 2019-08-19 NOTE — Progress Notes (Signed)
PROGRESS NOTE    Connie Haynes  LTJ:[id number] DOB: 09/09/1955 DOA: 08/10/2019 PCP: [name], MD     Brief Narrative:  64 y.o.WF PMHx CKD stage III, adrenal insufficiency, history of spinal cord stroke with no residual deficitsS/pneck surgery(about 10 years ago), morbid obesity, HTN, HLD,   Presents to the emergency department due to 1 week onset of bilateral lower extremity numbness and weakness as well as urinary retention. CSF Prelim results not suggestive of infectious etiology. Not convincing for inflammatory etiology either. Per neuro an longitudinally extensive inflammatory myelitis is the most likely etiology for the patient's dorsal spinal cord lesion with patchy enhancement on MRI.   Currently plan is continue IV steroids.   Subjective: 7/10 A/O x4, negative CP, negative abdominal pain.  S/p plasma exchange#3 patient feels extremely fatigued, states she feels the same way she was feeling prior to being diagnosed with adrenal insufficiency in~2018.  However she does not have any of the other SSX such as hypotension, hyperkalemia, hypercalcemia, hyponatremia.  Patient very knowledgeable about adrenal insufficiency   Assessment & Plan: Covid vaccination; positive vaccination   Principal Problem:   Transverse myelitis ([place]) Active Problems:   Essential hypertension, benign   Morbid obesity ([place])   Adrenal insufficiency (Addison's disease) ([place])   Transverse myelitis -Neuromyelitis antibody positive -7/1 s/p LP; negative for infection. -ACElevels,ANA, RF,SSA, SSB,B12, DsDNA, neuromyelitis optica autoantibodyIgGand anti-Jo 1 antibody were ordered.  Pending -Neuro started the pt on high dose 1gm IV solumedrol x 5 days. -7/2 MRI Brain shows right sided inferior frontal lobe/medial temporal lobe increased T2 and FLAIR signal and a 9 mm focus of contrast enhancement suggestive of subacute infarction versus demyelinating disease versus mass. But based on the spinal  cord abnormalities shown yesterday, demyelinating disease is favored. -Fall precautions -7/7 s/p.  Plasma exchange x1.  Today will be a holiday, and plan is to restart tomorrow -7/10 Plasma Exchange #3 completed  Essential hypertension -Coreg BID -7/10 Hydralazine 5 mg QID  GERD -Continue Protonix  Adrenal insufficiency -Hold home hydrocortisone while on IV steroids.  -7/10 continue to hold home Solu-Cortef, BP still slightly high will need to monitor closely.  Currently patient does not appear to be suffering from any side effects from adrenal insufficiency nor do her labs reflect adrenal insufficiency i.e hyponatremia, hyperkalemia, hypercalcemia.  May be hard to detect because of plasma exchange.  -7/10 suggested we start patient back on Solu-Cortef, she would like to wait until 7/11 which is rest day off plasma exchange, if she still has the same feelings as she previously had in 2018 agree to restart medication.  Morbid obesity -Outpatient weight 122kg.   Anxiety and tremors -Citalopram 20 mg daily -Lorazepam 0.5 mg  PRN   HLD  -7/8 LDL= 29  H/o CVA No residual deficit  Monitor   OSA -CPAP per respiratory  Restless leg syndrome -Pramipexole 1.5 mg daily   CKD stage II Recent Labs  Lab 08/17/19 0413 08/18/19 0349 08/18/19 1846 08/19/19 0356 08/19/19 1008  CREATININE 1.11* 0.92 1.20* 0.87 0.97  -Follows up with nephrology as outpatient  -Stable  Hypokalemia -Resolved continue to monitor  Goals of care -PT/OT recommends home health.  Will have them reevaluate once course of plasma exchange has been completed   DVT prophylaxis: Lovenox Code Status: Full Family Communication: 7/10 husband at bedside for discussion of plan of care Status is: Inpatient    Dispo: The patient is from: Home  Anticipated d/c is to: Home              Anticipated d/c date is: Neurology              Patient currently unstable      Consultants:   Neurology   Procedures/Significant Events:  7/1 s/p LP 7/1 MRI C-spine, T-spine, L-spine W0 contrast;-Abnormal cord signal extending superiorly from the T3-T4 level into the cervical spine -Multilevel degenerative changes. 7/2 LP.  Opening pressure 20 cm H2O 7/2 MRI brain W/W0 contrast;Abnormality at the inferior frontal lobe/medial temporal lobe on the right with a region of increased T2 and FLAIR signal and a 9 mm focus of contrast enhancement. Differential diagnosis is late subacute infarction versus demyelinating disease versus mass. Based on the spinal cord abnormalities shown yesterday, demyelinating disease is favored. A focus of cerebritis seems least likely. Neuro sarcoid is also being considered in the clinical differential diagnosis of the spinal disease, which could possibly be consistent with this brain lesion.  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 7/6 CT chest W0 contrast;No definite thoracic adenopathy. -5 x 10 mm RIGHT middle lobe nodule image 69; consider one of the following in 3 months for both low-risk and high-risk individuals: (a) repeat chest CT,    I have personally reviewed and interpreted all radiology studies and my findings are as above.  VENTILATOR SETTINGS:    Cultures 7/1 CSF negative VDRL 7/1 CSF negative final  7/1 blood HSV 1/2  Negative 7/1 urine negative final 7/5 blood RPR negative  Antimicrobials:    Devices    LINES / TUBES:      Continuous Infusions: . citrate dextrose       Objective: Vitals:   08/19/19 0925 08/19/19 0940 08/19/19 0955 08/19/19 1005  BP: (!) 158/70 (!) 150/72 (!) 150/84 (!) 145/69  Pulse: 62 62 (!) 59 70  Resp:    16  Temp: 98.3 F (36.8 C) 98.2 F (36.8 C) 98.2 F (36.8 C) 98.9 F (37.2 C)  TempSrc: Oral Oral Oral Oral  SpO2:    96%  Weight:      Height:        Intake/Output Summary (Last 24 hours) at 08/19/2019 1041 Last data  filed at 08/18/2019 1723 Gross per 24 hour  Intake 440 ml  Output --  Net 440 ml   Filed Weights   08/14/19 1900 08/18/19 0944 08/19/19 0623  Weight: 127 kg 125.6 kg 126.6 kg   Physical Exam:  General: A/O x4, No acute respiratory distress, extremely fatigued s/p #3 session plasma exchange Eyes: negative scleral hemorrhage, negative anisocoria, negative icterus ENT: Negative Runny nose, negative gingival bleeding, Neck:  Negative scars, masses, torticollis, lymphadenopathy, JVD Lungs: Clear to auscultation bilaterally without wheezes or crackles Cardiovascular: Regular rate and rhythm without murmur gallop or rub normal S1 and S2 Abdomen: MORBIDLY OBESE, negative abdominal pain, nondistended, positive soft, bowel sounds, no rebound, no ascites, no appreciable mass Extremities: No significant cyanosis, clubbing, or edema bilateral lower extremities Skin: Negative rashes, lesions, ulcers Psychiatric:  Negative depression, negative anxiety, negative fatigue, negative mania  Central nervous system:  Cranial nerves II through XII intact, tongue/uvula midline, all extremities muscle strength 5/5, sensation intact throughout, negative dysarthria, negative expressive aphasia, negative receptive aphasia.  .     Data Reviewed: Care during the described time interval was provided by me .  I have reviewed this patient's available data, including medical history, events of note, physical examination,  and all test results as part of my evaluation.  CBC: Recent Labs  Lab 08/15/19 0703 08/16/19 0503 08/17/19 0413 08/18/19 0349 08/19/19 0356  WBC 10.7* 10.8* 9.1 10.5 12.7*  NEUTROABS  --   --  6.9 6.6 11.0*  HGB 13.0 12.7 12.0 12.5 12.7  HCT 39.1 38.1 36.6 38.3 38.0  MCV 90.5 90.1 91.7 91.6 92.5  PLT 292 284 245 236 235   Basic Metabolic Panel: Recent Labs  Lab 08/17/19 0413 08/18/19 0349 08/18/19 1846 08/19/19 0356 08/19/19 1008  NA 137 137 134* 137 136  K 3.2* 3.1* 4.0 4.7 4.1   CL 103 103 106 108 105  CO2 27 26 19* 22 21*  GLUCOSE 102* 102* 251* 178* 226*  BUN 26* 18 19 16 17   CREATININE 1.11* 0.92 1.20* 0.87 0.97  CALCIUM 8.3* 8.3* 7.9* 8.1* 8.3*  MG 2.2 2.2 1.7 2.0  --   PHOS 3.8 2.8  --  2.6  --    GFR: Estimated Creatinine Clearance: 76.4 mL/min (by C-G formula based on SCr of 0.97 mg/dL). Liver Function Tests: Recent Labs  Lab 08/17/19 0413 08/18/19 0349 08/19/19 0356  AST 21 16 11*  ALT 24 22 12   ALKPHOS 29* 36* 21*  BILITOT 0.8 0.7 0.8  PROT 4.9* 5.3* 4.8*  ALBUMIN 3.6 3.6 3.9   No results for input(s): LIPASE, AMYLASE in the last 168 hours. No results for input(s): AMMONIA in the last 168 hours. Coagulation Profile: No results for input(s): INR, PROTIME in the last 168 hours. Cardiac Enzymes: No results for input(s): CKTOTAL, CKMB, CKMBINDEX, TROPONINI in the last 168 hours. BNP (last 3 results) No results for input(s): PROBNP in the last 8760 hours. HbA1C: No results for input(s): HGBA1C in the last 72 hours. CBG: No results for input(s): GLUCAP in the last 168 hours. Lipid Profile: Recent Labs    08/17/19 0413  CHOL 79  HDL 22*  LDLCALC 29  TRIG 141  CHOLHDL 3.6   Thyroid Function Tests: No results for input(s): TSH, T4TOTAL, FREET4, T3FREE, THYROIDAB in the last 72 hours. Anemia Panel: No results for input(s): VITAMINB12, FOLATE, FERRITIN, TIBC, IRON, RETICCTPCT in the last 72 hours. Sepsis Labs: No results for input(s): PROCALCITON, LATICACIDVEN in the last 168 hours.  Recent Results (from the past 240 hour(s))  SARS Coronavirus 2 by RT PCR (hospital order, performed in [facility] hospital lab) Nasopharyngeal Nasopharyngeal Swab     Status: None   Collection Time: 08/10/19  3:43 PM   Specimen: Nasopharyngeal Swab  Result Value Ref Range Status   SARS Coronavirus 2 NEGATIVE NEGATIVE Final    Comment: (NOTE) SARS-CoV-2 target nucleic acids are NOT DETECTED.  The SARS-CoV-2 RNA is generally detectable in upper and  lower respiratory specimens during the acute phase of infection. The lowest concentration of SARS-CoV-2 viral copies this assay can detect is 250 copies / mL. A negative result does not preclude SARS-CoV-2 infection and should not be used as the sole basis for treatment or other patient management decisions.  A negative result may occur with improper specimen collection / handling, submission of specimen other than nasopharyngeal swab, presence of viral mutation(s) within the areas targeted by this assay, and inadequate number of viral copies (<250 copies / mL). A negative result must be combined with clinical observations, patient history, and epidemiological information.  Fact Sheet for Patients:   StrictlyIdeas.no  Fact Sheet for Healthcare Providers: BankingDealers.co.za  This test is not yet approved or  cleared by the [place]  States FDA and has been authorized for detection and/or diagnosis of SARS-CoV-2 by FDA under an Emergency Use Authorization (EUA).  This EUA will remain in effect (meaning this test can be used) for the duration of the COVID-19 declaration under Section 564(b)(1) of the Act, 21 U.S.C. section 360bbb-3(b)(1), unless the authorization is terminated or revoked sooner.  Performed at [facility], [address] [place], [place] [address]   Urine Culture     Status: None   Collection Time: 08/10/19  7:05 PM   Specimen: Urine, Random  Result Value Ref Range Status   Specimen Description URINE, RANDOM  Final   Special Requests NONE  Final   Culture   Final    NO GROWTH Performed at [facility], [place] [address] [place], [place] [address]    Report Status 08/12/2019 FINAL  Final  CSF culture with Stat gram stain     Status: None   Collection Time: 08/10/19  9:46 PM   Specimen: CSF; Cerebrospinal Fluid  Result Value Ref Range Status   Specimen Description CSF  Final   Special Requests NONE   Final   Gram Stain   Final    WBC PRESENT,BOTH PMN AND MONONUCLEAR NO ORGANISMS SEEN CYTOSPIN SMEAR    Culture   Final    NO GROWTH Performed at [facility], [place] [address], [place], [place] [address]    Report Status 08/14/2019 FINAL  Final         Radiology Studies: No results found.      Scheduled Meds: . calcium carbonate  2 tablet Oral Q3H  . carvedilol  6.25 mg Oral BID  . Chlorhexidine Gluconate Cloth  6 each Topical Daily  . citalopram  20 mg Oral QHS  . enoxaparin (LOVENOX) injection  60 mg Subcutaneous Q24H  . heparin sodium (porcine)  1,000 Units Intracatheter Once  . lubiprostone  24 mcg Oral BID WC  . pantoprazole  40 mg Oral Daily  . pramipexole  1.5 mg Oral QHS  . senna-docusate  1 tablet Oral BID   Continuous Infusions: . citrate dextrose       LOS: 8 days    Time spent:40 min    Navaya Wiatrek, Geraldo Docker, MD [facility] Pager [phone]  If 7PM-7AM, please contact night-coverage [email or web address] Password [facility] 08/19/2019, 10:41 AM

## 2019-08-20 LAB — CBC WITH DIFFERENTIAL/PLATELET
Abs Immature Granulocytes: 0.28 10*3/uL — ABNORMAL HIGH (ref 0.00–0.07)
Basophils Absolute: 0 10*3/uL (ref 0.0–0.1)
Basophils Relative: 0 %
Eosinophils Absolute: 0.2 10*3/uL (ref 0.0–0.5)
Eosinophils Relative: 2 %
HCT: 35.6 % — ABNORMAL LOW (ref 36.0–46.0)
Hemoglobin: 11.8 g/dL — ABNORMAL LOW (ref 12.0–15.0)
Immature Granulocytes: 2 %
Lymphocytes Relative: 25 %
Lymphs Abs: 3.6 10*3/uL (ref 0.7–4.0)
MCH: 30.9 pg (ref 26.0–34.0)
MCHC: 33.1 g/dL (ref 30.0–36.0)
MCV: 93.2 fL (ref 80.0–100.0)
Monocytes Absolute: 0.7 10*3/uL (ref 0.1–1.0)
Monocytes Relative: 5 %
Neutro Abs: 9.5 10*3/uL — ABNORMAL HIGH (ref 1.7–7.7)
Neutrophils Relative %: 66 %
Platelets: 224 10*3/uL (ref 150–400)
RBC: 3.82 MIL/uL — ABNORMAL LOW (ref 3.87–5.11)
RDW: 13.4 % (ref 11.5–15.5)
WBC: 14.4 10*3/uL — ABNORMAL HIGH (ref 4.0–10.5)
nRBC: 0 % (ref 0.0–0.2)

## 2019-08-20 LAB — COMPREHENSIVE METABOLIC PANEL
ALT: 8 U/L (ref 0–44)
AST: 14 U/L — ABNORMAL LOW (ref 15–41)
Albumin: 3.7 g/dL (ref 3.5–5.0)
Alkaline Phosphatase: 13 U/L — ABNORMAL LOW (ref 38–126)
Anion gap: 7 (ref 5–15)
BUN: 20 mg/dL (ref 8–23)
CO2: 19 mmol/L — ABNORMAL LOW (ref 22–32)
Calcium: 8.2 mg/dL — ABNORMAL LOW (ref 8.9–10.3)
Chloride: 110 mmol/L (ref 98–111)
Creatinine, Ser: 0.93 mg/dL (ref 0.44–1.00)
GFR calc Af Amer: >60 mL/min (ref >60)
GFR calc non Af Amer: >60 mL/min (ref >60)
Glucose, Bld: 111 mg/dL — ABNORMAL HIGH (ref 70–99)
Potassium: 4.3 mmol/L (ref 3.5–5.1)
Sodium: 136 mmol/L (ref 135–145)
Total Bilirubin: 0.6 mg/dL (ref 0.3–1.2)
Total Protein: 4.4 g/dL — ABNORMAL LOW (ref 6.5–8.1)

## 2019-08-20 LAB — MAGNESIUM: Magnesium: 1.9 mg/dL (ref 1.7–2.4)

## 2019-08-20 LAB — PHOSPHORUS: Phosphorus: 2.1 mg/dL — ABNORMAL LOW (ref 2.5–4.6)

## 2019-08-20 MED ORDER — HYDRALAZINE HCL 20 MG/ML IJ SOLN
5.0000 mg | Freq: Four times a day (QID) | INTRAMUSCULAR | Status: DC
  Administered 2019-08-20: 5 mg via INTRAVENOUS
  Filled 2019-08-20 (×3): qty 1

## 2019-08-20 MED ORDER — CITALOPRAM HYDROBROMIDE 20 MG PO TABS
30.0000 mg | ORAL_TABLET | Freq: Every day | ORAL | Status: DC
  Administered 2019-08-20 – 2019-08-23 (×4): 30 mg via ORAL
  Filled 2019-08-20 (×4): qty 2

## 2019-08-20 MED ORDER — LORAZEPAM 1 MG PO TABS: 1.0000 mg | ORAL_TABLET | ORAL | Status: DC | PRN

## 2019-08-20 MED ORDER — SODIUM PHOSPHATES 45 MMOLE/15ML IV SOLN
20.0000 mmol | Freq: Once | INTRAVENOUS | Status: AC
  Administered 2019-08-20: 20 mmol via INTRAVENOUS
  Filled 2019-08-20: qty 6.67

## 2019-08-20 NOTE — Progress Notes (Signed)
Subjective: Still improving.   Exam: Vitals:   08/20/19 1729 08/20/19 1738  BP:  (!) 141/47  Pulse: 93 77  Resp:  17  Temp:  97.8 F (36.6 C)  SpO2:  100%   Gen: In bed, NAD Resp: non-labored breathing, no acute distress Abd: soft, nt  Neuro: MS: Awake, alert, interactive and appropriate CN: VFF, EOMI Motor: 4+/5 in bilateral UE and RLE, left foot with significantly improved 3/5 dorsiflexion today Sensory: Decreased to light touch from just above the knee downwards on the left, intact to LT on the right.    Pertinent Labs: CSF WBC 4 CSF RBC 15 CSF Protein 48 CSF Glucose 49 CSF HSV negative  OC Bands - normal IgG index - normal CSF ACE - normal MOG, NMO - negative.   Serum ACE - 52, normal RNP, SSA, SSB, SCL-70, ENA SM, ds DNA all normal Jo-1 normal RF normal B12 376 TSH normal Homocysteine - normal RPR negative Ceruloplasmin normal.   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  There is inflammation as evidenced by contrast enhancement, though no leukocytosis.  The combination of a supratentorial enhancing lesion as well as spinal enhancing lesion would suggest inflammatory process.  She has received steroids, and is currently getting improvement with PLEX. Etiology is currently idiopathic.    Recommendations: 1) plasmapheresis treatment 4 tomorrow, last tx on  Wednesday 2) following PLEX, would not pursue further treatment other than PT,OT.   Roland Rack, MD [facility] [phone]  If 7pm- 7am, please page neurology on call as listed in [place].

## 2019-08-20 NOTE — Progress Notes (Signed)
PROGRESS NOTE    Connie Haynes  ERD:[id number] DOB: 1955/12/24 DOA: 08/10/2019 PCP: [name], MD     Brief Narrative:  64 y.o.WF PMHx CKD stage III, adrenal insufficiency, history of spinal cord stroke with no residual deficitsS/pneck surgery(about 10 years ago), morbid obesity, HTN, HLD,   Presents to the emergency department due to 1 week onset of bilateral lower extremity numbness and weakness as well as urinary retention. CSF Prelim results not suggestive of infectious etiology. Not convincing for inflammatory etiology either. Per neuro an longitudinally extensive inflammatory myelitis is the most likely etiology for the patient's dorsal spinal cord lesion with patchy enhancement on MRI.   Currently plan is continue IV steroids.   Subjective: 7/11 A/O x4, negative CP, negative abdominal pain.  Looks much better, and feels much better.  States she is not having any SSX of adrenal insufficiency and wants to continue to hold off on restarting Solu-Cortef.  Negative hypotension, negative hyperkalemia, negative hypercalcemia.    Assessment & Plan: Covid vaccination; positive vaccination   Principal Problem:   Transverse myelitis ([place]) Active Problems:   Essential hypertension, benign   Morbid obesity ([place])   Adrenal insufficiency (Addison's disease) ([place])   Transverse myelitis -Neuromyelitis antibody positive -7/1 s/p LP; negative for infection. -ACElevels,ANA, RF,SSA, SSB,B12, DsDNA, neuromyelitis optica autoantibodyIgGand anti-Jo 1 antibody were ordered.  Pending -Neuro started the pt on high dose 1gm IV solumedrol x 5 days. -7/2 MRI Brain shows right sided inferior frontal lobe/medial temporal lobe increased T2 and FLAIR signal and a 9 mm focus of contrast enhancement suggestive of subacute infarction versus demyelinating disease versus mass. But based on the spinal cord abnormalities shown yesterday, demyelinating disease is favored. -Fall precautions -7/7  s/p.  Plasma exchange x1.  Today will be a holiday, and plan is to restart tomorrow -7/10 Plasma Exchange #3 completed  Essential hypertension -Coreg BID -7/10 Hydralazine 5 mg QID  GERD -Continue Protonix  Adrenal insufficiency -Hold home hydrocortisone while on IV steroids.  -7/10 continue to hold home Solu-Cortef, BP still slightly high will need to monitor closely.  Currently patient does not appear to be suffering from any side effects from adrenal insufficiency nor do her labs reflect adrenal insufficiency i.e hyponatremia, hyperkalemia, hypercalcemia.  May be hard to detect because of plasma exchange.  -7/10 suggested we start patient back on Solu-Cortef, she would like to wait until 7/11 which is rest day off plasma exchange, if she still has the same feelings as she previously had in 2018 agree to restart medication. -7/11 patient feeling much better today would like to continue to hold off on restarting Solu-Cortef.  Understands that if her BP drops or she begins to experience hyperkalemia, hypercalcemia, or hypotension we will immediately need to restart medication.  Agrees with plan.  Morbid obesity -Outpatient weight 122kg.   Anxiety and tremors -Citalopram 20 mg daily -Lorazepam 0.5 mg  PRN   HLD  -7/8 LDL= 29  H/o CVA No residual deficit  Monitor   OSA -CPAP per respiratory  Restless leg syndrome -Pramipexole 1.5 mg daily   CKD stage II Recent Labs  Lab 08/18/19 0349 08/18/19 1846 08/19/19 0356 08/19/19 1008 08/20/19 0206  CREATININE 0.92 1.20* 0.87 0.97 0.93  -Follows up with nephrology as outpatient  -Stable  Hypokalemia -Resolved continue to monitor  Hypophosphatemia -Sodium phosphate 20 mmol  Anxiety -7/11 increase Ativan 1 mg PRN -7/11 increase Celexa 30 mg daily  Goals of care -PT/OT recommends home health.  Will have them  reevaluate once course of plasma exchange has been completed   DVT prophylaxis: Lovenox Code Status:  Full Family Communication: 7/11 husband at bedside for discussion of plan of care Status is: Inpatient    Dispo: The patient is from: Home              Anticipated d/c is to: Home              Anticipated d/c date is: Neurology              Patient currently unstable      Consultants:  Neurology   Procedures/Significant Events:  7/1 s/p LP 7/1 MRI C-spine, T-spine, L-spine W0 contrast;-Abnormal cord signal extending superiorly from the T3-T4 level into the cervical spine -Multilevel degenerative changes. 7/2 LP.  Opening pressure 20 cm H2O 7/2 MRI brain W/W0 contrast;Abnormality at the inferior frontal lobe/medial temporal lobe on the right with a region of increased T2 and FLAIR signal and a 9 mm focus of contrast enhancement. Differential diagnosis is late subacute infarction versus demyelinating disease versus mass. Based on the spinal cord abnormalities shown yesterday, demyelinating disease is favored. A focus of cerebritis seems least likely. Neuro sarcoid is also being considered in the clinical differential diagnosis of the spinal disease, which could possibly be consistent with this brain lesion.  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 7/6 CT chest W0 contrast;No definite thoracic adenopathy. -5 x 10 mm RIGHT middle lobe nodule image 69; consider one of the following in 3 months for both low-risk and high-risk individuals: (a) repeat chest CT,    I have personally reviewed and interpreted all radiology studies and my findings are as above.  VENTILATOR SETTINGS:    Cultures 7/1 CSF negative VDRL 7/1 CSF negative final  7/1 blood HSV 1/2  Negative 7/1 urine negative final 7/5 blood RPR negative  Antimicrobials:    Devices    LINES / TUBES:      Continuous Infusions:    Objective: Vitals:   08/19/19 2140 08/19/19 2316 08/20/19 0530 08/20/19 1135  BP: (!) 126/57 (!) 142/55 125/66 (!)  141/71  Pulse:    65  Resp:  17 18 18   Temp:  98 F (36.7 C) 97.8 F (36.6 C) 98.5 F (36.9 C)  TempSrc:  Oral Oral Oral  SpO2:  100% 99% 99%  Weight:      Height:        Intake/Output Summary (Last 24 hours) at 08/20/2019 1432 Last data filed at 08/20/2019 0800 Gross per 24 hour  Intake 1340 ml  Output --  Net 1340 ml   Filed Weights   08/14/19 1900 08/18/19 0944 08/19/19 0623  Weight: 127 kg 125.6 kg 126.6 kg    Physical Exam:  General: A/O x4,,No acute respiratory distress Eyes: negative scleral hemorrhage, negative anisocoria, negative icterus ENT: Negative Runny nose, negative gingival bleeding, Neck:  Negative scars, masses, torticollis, lymphadenopathy, JVD Lungs: Clear to auscultation bilaterally without wheezes or crackles Cardiovascular: Regular rate and rhythm without murmur gallop or rub normal S1 and S2 Abdomen: MORBIDLY OBESE, negative abdominal pain, nondistended, positive soft, bowel sounds, no rebound, no ascites, no appreciable mass Extremities: No significant cyanosis, clubbing, or edema bilateral lower extremities Skin: Negative rashes, lesions, ulcers Psychiatric:  Negative depression, negative anxiety, negative fatigue, negative mania  Central nervous system:  Cranial nerves II through XII intact, tongue/uvula midline, all extremities muscle strength 5/5, sensation intact throughout, negative dysarthria, negative expressive aphasia, negative receptive  aphasia. .     Data Reviewed: Care during the described time interval was provided by me .  I have reviewed this patient's available data, including medical history, events of note, physical examination, and all test results as part of my evaluation.  CBC: Recent Labs  Lab 08/16/19 0503 08/17/19 0413 08/18/19 0349 08/19/19 0356 08/20/19 0206  WBC 10.8* 9.1 10.5 12.7* 14.4*  NEUTROABS  --  6.9 6.6 11.0* 9.5*  HGB 12.7 12.0 12.5 12.7 11.8*  HCT 38.1 36.6 38.3 38.0 35.6*  MCV 90.1 91.7 91.6 92.5  93.2  PLT 284 245 236 224 262   Basic Metabolic Panel: Recent Labs  Lab 08/17/19 0413 08/17/19 0413 08/18/19 0349 08/18/19 1846 08/19/19 0356 08/19/19 1008 08/20/19 0206  NA 137   < > 137 134* 137 136 136  K 3.2*   < > 3.1* 4.0 4.7 4.1 4.3  CL 103   < > 103 106 108 105 110  CO2 27   < > 26 19* 22 21* 19*  GLUCOSE 102*   < > 102* 251* 178* 226* 111*  BUN 26*   < > 18 19 16 17 20   CREATININE 1.11*   < > 0.92 1.20* 0.87 0.97 0.93  CALCIUM 8.3*   < > 8.3* 7.9* 8.1* 8.3* 8.2*  MG 2.2  --  2.2 1.7 2.0  --  1.9  PHOS 3.8  --  2.8  --  2.6  --  2.1*   < > = values in this interval not displayed.   GFR: Estimated Creatinine Clearance: 79.7 mL/min (by C-G formula based on SCr of 0.93 mg/dL). Liver Function Tests: Recent Labs  Lab 08/17/19 0413 08/18/19 0349 08/19/19 0356 08/20/19 0206  AST 21 16 11* 14*  ALT 24 22 12 8   ALKPHOS 29* 36* 21* 13*  BILITOT 0.8 0.7 0.8 0.6  PROT 4.9* 5.3* 4.8* 4.4*  ALBUMIN 3.6 3.6 3.9 3.7   No results for input(s): LIPASE, AMYLASE in the last 168 hours. No results for input(s): AMMONIA in the last 168 hours. Coagulation Profile: No results for input(s): INR, PROTIME in the last 168 hours. Cardiac Enzymes: No results for input(s): CKTOTAL, CKMB, CKMBINDEX, TROPONINI in the last 168 hours. BNP (last 3 results) No results for input(s): PROBNP in the last 8760 hours. HbA1C: No results for input(s): HGBA1C in the last 72 hours. CBG: No results for input(s): GLUCAP in the last 168 hours. Lipid Profile: No results for input(s): CHOL, HDL, LDLCALC, TRIG, CHOLHDL, LDLDIRECT in the last 72 hours. Thyroid Function Tests: No results for input(s): TSH, T4TOTAL, FREET4, T3FREE, THYROIDAB in the last 72 hours. Anemia Panel: No results for input(s): VITAMINB12, FOLATE, FERRITIN, TIBC, IRON, RETICCTPCT in the last 72 hours. Sepsis Labs: No results for input(s): PROCALCITON, LATICACIDVEN in the last 168 hours.  Recent Results (from the past 240 hour(s))   SARS Coronavirus 2 by RT PCR (hospital order, performed in [facility] hospital lab) Nasopharyngeal Nasopharyngeal Swab     Status: None   Collection Time: 08/10/19  3:43 PM   Specimen: Nasopharyngeal Swab  Result Value Ref Range Status   SARS Coronavirus 2 NEGATIVE NEGATIVE Final    Comment: (NOTE) SARS-CoV-2 target nucleic acids are NOT DETECTED.  The SARS-CoV-2 RNA is generally detectable in upper and lower respiratory specimens during the acute phase of infection. The lowest concentration of SARS-CoV-2 viral copies this assay can detect is 250 copies / mL. A negative result does not preclude SARS-CoV-2 infection and should not  be used as the sole basis for treatment or other patient management decisions.  A negative result may occur with improper specimen collection / handling, submission of specimen other than nasopharyngeal swab, presence of viral mutation(s) within the areas targeted by this assay, and inadequate number of viral copies (<250 copies / mL). A negative result must be combined with clinical observations, patient history, and epidemiological information.  Fact Sheet for Patients:   StrictlyIdeas.no  Fact Sheet for Healthcare Providers: BankingDealers.co.za  This test is not yet approved or  cleared by the [place] FDA and has been authorized for detection and/or diagnosis of SARS-CoV-2 by FDA under an Emergency Use Authorization (EUA).  This EUA will remain in effect (meaning this test can be used) for the duration of the COVID-19 declaration under Section 564(b)(1) of the Act, 21 U.S.C. section 360bbb-3(b)(1), unless the authorization is terminated or revoked sooner.  Performed at [facility], [address] [place], [place] [address]   Urine Culture     Status: None   Collection Time: 08/10/19  7:05 PM   Specimen: Urine, Random  Result Value Ref Range Status   Specimen Description URINE,  RANDOM  Final   Special Requests NONE  Final   Culture   Final    NO GROWTH Performed at [facility], [place] [address], [place], [place] [address]    Report Status 08/12/2019 FINAL  Final  CSF culture with Stat gram stain     Status: None   Collection Time: 08/10/19  9:46 PM   Specimen: CSF; Cerebrospinal Fluid  Result Value Ref Range Status   Specimen Description CSF  Final   Special Requests NONE  Final   Gram Stain   Final    WBC PRESENT,BOTH PMN AND MONONUCLEAR NO ORGANISMS SEEN CYTOSPIN SMEAR    Culture   Final    NO GROWTH Performed at [facility], [place] [address] [place], [place] [address]    Report Status 08/14/2019 FINAL  Final         Radiology Studies: No results found.      Scheduled Meds: . carvedilol  6.25 mg Oral BID  . Chlorhexidine Gluconate Cloth  6 each Topical Daily  . citalopram  20 mg Oral QHS  . enoxaparin (LOVENOX) injection  60 mg Subcutaneous Q24H  . lubiprostone  24 mcg Oral BID WC  . pantoprazole  40 mg Oral Daily  . pramipexole  1.5 mg Oral QHS  . senna-docusate  1 tablet Oral BID   Continuous Infusions:    LOS: 9 days    Time spent:40 min    Shante [place]et, Geraldo Docker, MD [facility] Pager [phone]  If 7PM-7AM, please contact night-coverage [email or web address] Password [facility] 08/20/2019, 2:32 PM

## 2019-08-20 NOTE — Progress Notes (Signed)
Patient reported not feeling well 15 minutes post admin of hydralazine, symptoms characterized by increase HR (HR at 93) and pt states she doesn't feel good at all. MD notified of finding.

## 2019-08-21 ENCOUNTER — Encounter (HOSPITAL_COMMUNITY): Payer: Self-pay | Admitting: Pulmonary Disease

## 2019-08-21 LAB — CBC WITH DIFFERENTIAL/PLATELET
Abs Immature Granulocytes: 0.23 10*3/uL — ABNORMAL HIGH (ref 0.00–0.07)
Basophils Absolute: 0 10*3/uL (ref 0.0–0.1)
Basophils Relative: 0 %
Eosinophils Absolute: 0.3 10*3/uL (ref 0.0–0.5)
Eosinophils Relative: 3 %
HCT: 33.6 % — ABNORMAL LOW (ref 36.0–46.0)
Hemoglobin: 10.9 g/dL — ABNORMAL LOW (ref 12.0–15.0)
Immature Granulocytes: 2 %
Lymphocytes Relative: 33 %
Lymphs Abs: 3.3 10*3/uL (ref 0.7–4.0)
MCH: 30.3 pg (ref 26.0–34.0)
MCHC: 32.4 g/dL (ref 30.0–36.0)
MCV: 93.3 fL (ref 80.0–100.0)
Monocytes Absolute: 0.7 10*3/uL (ref 0.1–1.0)
Monocytes Relative: 7 %
Neutro Abs: 5.6 10*3/uL (ref 1.7–7.7)
Neutrophils Relative %: 55 %
Platelets: 198 10*3/uL (ref 150–400)
RBC: 3.6 MIL/uL — ABNORMAL LOW (ref 3.87–5.11)
RDW: 13.8 % (ref 11.5–15.5)
WBC: 10.2 10*3/uL (ref 4.0–10.5)
nRBC: 0 % (ref 0.0–0.2)

## 2019-08-21 LAB — BASIC METABOLIC PANEL
Anion gap: 9 (ref 5–15)
BUN: 14 mg/dL (ref 8–23)
CO2: 21 mmol/L — ABNORMAL LOW (ref 22–32)
Calcium: 8.3 mg/dL — ABNORMAL LOW (ref 8.9–10.3)
Chloride: 106 mmol/L (ref 98–111)
Creatinine, Ser: 0.96 mg/dL (ref 0.44–1.00)
GFR calc Af Amer: >60 mL/min (ref >60)
GFR calc non Af Amer: >60 mL/min (ref >60)
Glucose, Bld: 113 mg/dL — ABNORMAL HIGH (ref 70–99)
Potassium: 3.1 mmol/L — ABNORMAL LOW (ref 3.5–5.1)
Sodium: 136 mmol/L (ref 135–145)

## 2019-08-21 LAB — COMPREHENSIVE METABOLIC PANEL
ALT: 11 U/L (ref 0–44)
AST: 13 U/L — ABNORMAL LOW (ref 15–41)
Albumin: 3.7 g/dL (ref 3.5–5.0)
Alkaline Phosphatase: 21 U/L — ABNORMAL LOW (ref 38–126)
Anion gap: 8 (ref 5–15)
BUN: 14 mg/dL (ref 8–23)
CO2: 22 mmol/L (ref 22–32)
Calcium: 8.2 mg/dL — ABNORMAL LOW (ref 8.9–10.3)
Chloride: 106 mmol/L (ref 98–111)
Creatinine, Ser: 1.01 mg/dL — ABNORMAL HIGH (ref 0.44–1.00)
GFR calc Af Amer: >60 mL/min (ref >60)
GFR calc non Af Amer: 58 mL/min — ABNORMAL LOW (ref >60)
Glucose, Bld: 109 mg/dL — ABNORMAL HIGH (ref 70–99)
Potassium: 3.3 mmol/L — ABNORMAL LOW (ref 3.5–5.1)
Sodium: 136 mmol/L (ref 135–145)
Total Bilirubin: 1.1 mg/dL (ref 0.3–1.2)
Total Protein: 4.7 g/dL — ABNORMAL LOW (ref 6.5–8.1)

## 2019-08-21 LAB — PHOSPHORUS: Phosphorus: 3.1 mg/dL (ref 2.5–4.6)

## 2019-08-21 LAB — MAGNESIUM: Magnesium: 1.8 mg/dL (ref 1.7–2.4)

## 2019-08-21 MED ORDER — HEPARIN SODIUM (PORCINE) 1000 UNIT/ML IJ SOLN
INTRAMUSCULAR | Status: AC
  Administered 2019-08-21: 3000 [IU]
  Filled 2019-08-21: qty 4

## 2019-08-21 MED ORDER — CALCIUM CARBONATE ANTACID 500 MG PO CHEW
CHEWABLE_TABLET | ORAL | Status: AC
  Filled 2019-08-21: qty 2

## 2019-08-21 MED ORDER — HEPARIN SODIUM (PORCINE) 1000 UNIT/ML IJ SOLN: 1000.0000 [IU] | Freq: Once | INTRAMUSCULAR | Status: DC

## 2019-08-21 MED ORDER — ACETAMINOPHEN 325 MG PO TABS
650.0000 mg | ORAL_TABLET | ORAL | Status: DC | PRN
  Administered 2019-08-21: 650 mg via ORAL

## 2019-08-21 MED ORDER — ACD FORMULA A 0.73-2.45-2.2 GM/100ML VI SOLN: 1000.0000 mL | Status: DC

## 2019-08-21 MED ORDER — POTASSIUM CHLORIDE CRYS ER 20 MEQ PO TBCR
50.0000 meq | EXTENDED_RELEASE_TABLET | Freq: Two times a day (BID) | ORAL | Status: AC
  Administered 2019-08-21: 50 meq via ORAL
  Filled 2019-08-21 (×2): qty 2

## 2019-08-21 MED ORDER — SODIUM CHLORIDE 0.9 % IV SOLN
INTRAVENOUS | Status: AC
  Filled 2019-08-21 (×3): qty 200

## 2019-08-21 MED ORDER — CALCIUM GLUCONATE-NACL 2-0.675 GM/100ML-% IV SOLN
2.0000 g | Freq: Once | INTRAVENOUS | Status: AC
  Administered 2019-08-21: 2000 mg via INTRAVENOUS
  Filled 2019-08-21: qty 100

## 2019-08-21 MED ORDER — DIPHENHYDRAMINE HCL 25 MG PO CAPS
ORAL_CAPSULE | ORAL | Status: AC
  Filled 2019-08-21: qty 1

## 2019-08-21 MED ORDER — DIPHENHYDRAMINE HCL 25 MG PO CAPS
25.0000 mg | ORAL_CAPSULE | Freq: Four times a day (QID) | ORAL | Status: DC | PRN
  Administered 2019-08-21: 25 mg via ORAL

## 2019-08-21 MED ORDER — CALCIUM CARBONATE ANTACID 500 MG PO CHEW
2.0000 | CHEWABLE_TABLET | ORAL | Status: DC
  Administered 2019-08-21: 400 mg via ORAL

## 2019-08-21 MED ORDER — ACD FORMULA A 0.73-2.45-2.2 GM/100ML VI SOLN
Status: AC
  Filled 2019-08-21: qty 500

## 2019-08-21 MED ORDER — ACETAMINOPHEN 325 MG PO TABS
ORAL_TABLET | ORAL | Status: AC
  Filled 2019-08-21: qty 2

## 2019-08-21 NOTE — Progress Notes (Addendum)
NEUROLOGY PROGRESS NOTE   Subjective: No significant complaints.  Feels as though her right leg is actually slightly better with strength.  Exam: Vitals:   08/21/19 0838 08/21/19 1244  BP: (!) 178/68 (!) 106/46  Pulse: 62 72  Resp: 17 17  Temp: 97.9 F (36.6 C) 97.8 F (36.6 C)  SpO2: 99% 96%     Neuro:  Mental Status: Alert, oriented, thought content appropriate.  Speech fluent without evidence of aphasia.  Able to follow 3 step commands without difficulty. Cranial Nerves: II:  Visual fields grossly normal,  III,IV, VI: ptosis not present, extra-ocular motions intact bilaterally pupils equal, round, reactive to light and accommodation V,VII: smile symmetric, facial light touch sensation normal bilaterally VIII: hearing normal bilaterally XII: midline tongue extension Motor: Right : Upper extremity   5/5    Left:     Upper extremity   5/5  Right lower extremity has 4+4/5 strength with straight leg rise, 4+/5 plantarflexion, 5/5 dorsi flexion  Left lower extremity unable to move however able to flex toes 5/5.  Sensory: Continues to have no sensation from knee to foot bilateral legs Deep Tendon Reflexes: 2+ and symmetric bilateral ankle jerks with 1+ right brachial radialis and no left upper extremity deep tendon reflexes   Medications:  Scheduled: . acetaminophen      . calcium carbonate      . carvedilol  6.25 mg Oral BID  . Chlorhexidine Gluconate Cloth  6 each Topical Daily  . citalopram  30 mg Oral QHS  . diphenhydrAMINE      . enoxaparin (LOVENOX) injection  60 mg Subcutaneous Q24H  . hydrALAZINE  5 mg Intravenous Q6H  . lubiprostone  24 mcg Oral BID WC  . pantoprazole  40 mg Oral Daily  . pramipexole  1.5 mg Oral QHS  . senna-docusate  1 tablet Oral BID   Continuous: . citrate dextrose      Pertinent Labs/Diagnostics: Pertinent Labs: CSF WBC 4 CSF RBC 15 CSF Protein 48 CSF Glucose 49 CSF HSV negative  OC Bands - normal IgG index - normal CSF ACE -  normal MOG, NMO - negative.   Serum ACE - 52, normal RNP, SSA, SSB, SCL-70, ENA SM, ds DNA all normal Jo-1 normal RF normal B12 376 TSH normal Homocysteine - normal RPR negative Ceruloplasmin normal.   Etta Quill PA-C [facility] [phone]  Assessment:  64 year old female with a longitudinally extensive myelitis.  There is inflammation as evidenced by contrast-enhancement, though no leukocytosis.  The combination of supratentorial enhancing lesions as well as spinal enhancing lesions would suggest inflammatory process.  She has received steroids and will receive her last treatment of PLEX tomorrow.  She has had some improvement status post PLEX.  Recommendations: -Continue rehabilitation may need inpatient rehab -will receive her last treatment of plasma exchange tomorrow -To follow-up with Dr.Sater as outpatient  08/21/2019, 4:38 PM  Attending Neurohospitalist Addendum Patient seen and examined with APP/Resident. Agree with the history and physical as documented above. Agree with the plan as documented, which I helped formulate. I have independently reviewed the chart, obtained history, review of systems and examined the patient.I have personally reviewed pertinent head/neck/spine imaging (CT/MRI). Please feel free to call with any questions. --- Amie Portland, MD [facility] Pager: [phone]  If 7pm to 7am, please call on call as listed on AMION.

## 2019-08-21 NOTE — Progress Notes (Signed)
PROGRESS NOTE    Connie Haynes  TKW:[id number] DOB: 1955-02-10 DOA: 08/10/2019 PCP: [name], MD     Brief Narrative:  64 y.o.WF PMHx CKD stage III, adrenal insufficiency, history of spinal cord stroke with no residual deficitsS/pneck surgery(about 10 years ago), morbid obesity, HTN, HLD,   Presents to the emergency department due to 1 week onset of bilateral lower extremity numbness and weakness as well as urinary retention. CSF Prelim results not suggestive of infectious etiology. Not convincing for inflammatory etiology either. Per neuro an longitudinally extensive inflammatory myelitis is the most likely etiology for the patient's dorsal spinal cord lesion with patchy enhancement on MRI.   Currently plan is continue IV steroids.   Subjective: 7/12 A/O x4 - CP, negative abdominal pain, extremely fatigued secondary to #4 plasma exchange.  Still no sign of adrenal insufficiency negative hypotension, negative hyperkalemia negative hypercalcemia.  Continue to monitor closely    Assessment & Plan: Covid vaccination; positive vaccination   Principal Problem:   Transverse myelitis ([place]) Active Problems:   Essential hypertension, benign   Morbid obesity ([place])   Adrenal insufficiency (Addison's disease) ([place])   Transverse myelitis -Neuromyelitis antibody positive -7/1 s/p LP; negative for infection. -ACElevels,ANA, RF,SSA, SSB,B12, DsDNA, neuromyelitis optica autoantibodyIgGand anti-Jo 1 antibody were ordered.  Pending -Neuro started the pt on high dose 1gm IV solumedrol x 5 days. -7/2 MRI Brain shows right sided inferior frontal lobe/medial temporal lobe increased T2 and FLAIR signal and a 9 mm focus of contrast enhancement suggestive of subacute infarction versus demyelinating disease versus mass. But based on the spinal cord abnormalities shown yesterday, demyelinating disease is favored. -Fall precautions -7/7 s/p.  Plasma exchange x1.  Today will be a holiday, and  plan is to restart tomorrow -7/12 plasma Exchange #4 completed  Essential hypertension -Coreg BID -7/10 Hydralazine 5 mg QID  GERD -Continue Protonix  Adrenal insufficiency -Hold home hydrocortisone while on IV steroids.  -7/10 continue to hold home Solu-Cortef, BP still slightly high will need to monitor closely.  Currently patient does not appear to be suffering from any side effects from adrenal insufficiency nor do her labs reflect adrenal insufficiency i.e hyponatremia, hyperkalemia, hypercalcemia.  May be hard to detect because of plasma exchange.  -7/10 suggested we start patient back on Solu-Cortef, she would like to wait until 7/11 which is rest day off plasma exchange, if she still has the same feelings as she previously had in 2018 agree to restart medication. -7/11 patient feeling much better today would like to continue to hold off on restarting Solu-Cortef.  Understands that if her BP drops or she begins to experience hyperkalemia, hypercalcemia, or hypotension we will immediately need to restart medication.  Agrees with plan.  Morbid obesity -Outpatient weight 122kg.   Anxiety and tremors -Citalopram 20 mg daily -Lorazepam 0.5 mg  PRN   HLD  -7/8 LDL= 29  H/o CVA No residual deficit  Monitor   OSA -CPAP per respiratory  Restless leg syndrome -Pramipexole 1.5 mg daily   CKD stage II Recent Labs  Lab 08/19/19 0356 08/19/19 1008 08/20/19 0206 08/21/19 0308 08/21/19 0800  CREATININE 0.87 0.97 0.93 1.01* 0.96  -Follows up with nephrology as outpatient  -Stable  Hypokalemia -Potassium goal> 4 -K. Dur 50 mEq x 2  Hypophosphatemia --Resolved  Anxiety -7/11 increase Ativan 1 mg PRN -7/11 increase Celexa 30 mg daily  Goals of care -PT/OT recommends home health.  Will have them reevaluate once course of plasma exchange has been completed  DVT prophylaxis: Lovenox Code Status: Full Family Communication: 7/11 husband at bedside for  discussion of plan of care Status is: Inpatient    Dispo: The patient is from: Home              Anticipated d/c is to: Home              Anticipated d/c date is: Neurology              Patient currently unstable      Consultants:  Neurology   Procedures/Significant Events:  7/1 s/p LP 7/1 MRI C-spine, T-spine, L-spine W0 contrast;-Abnormal cord signal extending superiorly from the T3-T4 level into the cervical spine -Multilevel degenerative changes. 7/2 LP.  Opening pressure 20 cm H2O 7/2 MRI brain W/W0 contrast;Abnormality at the inferior frontal lobe/medial temporal lobe on the right with a region of increased T2 and FLAIR signal and a 9 mm focus of contrast enhancement. Differential diagnosis is late subacute infarction versus demyelinating disease versus mass. Based on the spinal cord abnormalities shown yesterday, demyelinating disease is favored. A focus of cerebritis seems least likely. Neuro sarcoid is also being considered in the clinical differential diagnosis of the spinal disease, which could possibly be consistent with this brain lesion.  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 7/6 CT chest W0 contrast;No definite thoracic adenopathy. -5 x 10 mm RIGHT middle lobe nodule image 69; consider one of the following in 3 months for both low-risk and high-risk individuals: (a) repeat chest CT,    I have personally reviewed and interpreted all radiology studies and my findings are as above.  VENTILATOR SETTINGS:    Cultures 7/1 CSF negative VDRL 7/1 CSF negative final  7/1 blood HSV 1/2  Negative 7/1 urine negative final 7/5 blood RPR negative  Antimicrobials:    Devices    LINES / TUBES:      Continuous Infusions: . citrate dextrose       Objective: Vitals:   08/21/19 0814 08/21/19 0826 08/21/19 0838 08/21/19 1244  BP: (!) 168/72 (!) 177/70 (!) 178/68 (!) 106/46  Pulse: (!) 54 (!) 57  62 72  Resp: 17 16 17 17   Temp: 98.3 F (36.8 C) 98.3 F (36.8 C) 97.9 F (36.6 C) 97.8 F (36.6 C)  TempSrc: Oral Oral Oral Oral  SpO2: 98%  99% 96%  Weight:      Height:        Intake/Output Summary (Last 24 hours) at 08/21/2019 1511 Last data filed at 08/20/2019 1758 Gross per 24 hour  Intake 490 ml  Output --  Net 490 ml   Filed Weights   08/14/19 1900 08/18/19 0944 08/19/19 0623  Weight: 127 kg 125.6 kg 126.6 kg   Physical Exam:  General: A/O x4, no acute respiratory distress, extremely fatigued Eyes: negative scleral hemorrhage, negative anisocoria, negative icterus ENT: Negative Runny nose, negative gingival bleeding, Neck:  Negative scars, masses, torticollis, lymphadenopathy, JVD, right IJ Vas-Cath in place covered and clean negative sign of Infection Lungs: Clear to auscultation bilaterally without wheezes or crackles Cardiovascular: Regular rate and rhythm without murmur gallop or rub normal S1 and S2 Abdomen: MORBIDLY OBESE, negative abdominal pain, nondistended, positive soft, bowel sounds, no rebound, no ascites, no appreciable mass Extremities: No significant cyanosis, clubbing, or edema bilateral lower extremities Skin: Negative rashes, lesions, ulcers Psychiatric:  Negative depression, negative anxiety, negative fatigue, negative mania  Central nervous system:  Cranial nerves II through XII intact, tongue/uvula midline,  all extremities muscle strength 5/5, except for LL ED strength 2/10 sensation intact throughout, upper extremities, bilateral lower extremities sensation absent except RIGHT >>> LEFT  within normal limits, walking on heels within normal limits, negative dysarthria, negative expressive aphasia, negative receptive aphasia.    Data Reviewed: Care during the described time interval was provided by me .  I have reviewed this patient's available data, including medical history, events of note, physical examination, and all test results as part of my  evaluation.  CBC: Recent Labs  Lab 08/17/19 0413 08/18/19 0349 08/19/19 0356 08/20/19 0206 08/21/19 0308  WBC 9.1 10.5 12.7* 14.4* 10.2  NEUTROABS 6.9 6.6 11.0* 9.5* 5.6  HGB 12.0 12.5 12.7 11.8* 10.9*  HCT 36.6 38.3 38.0 35.6* 33.6*  MCV 91.7 91.6 92.5 93.2 93.3  PLT 245 236 224 224 761   Basic Metabolic Panel: Recent Labs  Lab 08/17/19 0413 08/17/19 0413 08/18/19 0349 08/18/19 0349 08/18/19 1846 08/18/19 1846 08/19/19 0356 08/19/19 1008 08/20/19 0206 08/21/19 0308 08/21/19 0800  NA 137   < > 137   < > 134*   < > 137 136 136 136 136  K 3.2*   < > 3.1*   < > 4.0   < > 4.7 4.1 4.3 3.3* 3.1*  CL 103   < > 103   < > 106   < > 108 105 110 106 106  CO2 27   < > 26   < > 19*   < > 22 21* 19* 22 21*  GLUCOSE 102*   < > 102*   < > 251*   < > 178* 226* 111* 109* 113*  BUN 26*   < > 18   < > 19   < > 16 17 20 14 14   CREATININE 1.11*   < > 0.92   < > 1.20*   < > 0.87 0.97 0.93 1.01* 0.96  CALCIUM 8.3*   < > 8.3*   < > 7.9*   < > 8.1* 8.3* 8.2* 8.2* 8.3*  MG 2.2   < > 2.2  --  1.7  --  2.0  --  1.9 1.8  --   PHOS 3.8  --  2.8  --   --   --  2.6  --  2.1* 3.1  --    < > = values in this interval not displayed.   GFR: Estimated Creatinine Clearance: 77.2 mL/min (by C-G formula based on SCr of 0.96 mg/dL). Liver Function Tests: Recent Labs  Lab 08/17/19 0413 08/18/19 0349 08/19/19 0356 08/20/19 0206 08/21/19 0308  AST 21 16 11* 14* 13*  ALT 24 22 12 8 11   ALKPHOS 29* 36* 21* 13* 21*  BILITOT 0.8 0.7 0.8 0.6 1.1  PROT 4.9* 5.3* 4.8* 4.4* 4.7*  ALBUMIN 3.6 3.6 3.9 3.7 3.7   No results for input(s): LIPASE, AMYLASE in the last 168 hours. No results for input(s): AMMONIA in the last 168 hours. Coagulation Profile: No results for input(s): INR, PROTIME in the last 168 hours. Cardiac Enzymes: No results for input(s): CKTOTAL, CKMB, CKMBINDEX, TROPONINI in the last 168 hours. BNP (last 3 results) No results for input(s): PROBNP in the last 8760 hours. HbA1C: No results  for input(s): HGBA1C in the last 72 hours. CBG: No results for input(s): GLUCAP in the last 168 hours. Lipid Profile: No results for input(s): CHOL, HDL, LDLCALC, TRIG, CHOLHDL, LDLDIRECT in the last 72 hours. Thyroid Function Tests: No results for input(s): TSH, T4TOTAL,  FREET4, T3FREE, THYROIDAB in the last 72 hours. Anemia Panel: No results for input(s): VITAMINB12, FOLATE, FERRITIN, TIBC, IRON, RETICCTPCT in the last 72 hours. Sepsis Labs: No results for input(s): PROCALCITON, LATICACIDVEN in the last 168 hours.  No results found for this or any previous visit (from the past 240 hour(s)).       Radiology Studies: No results found.      Scheduled Meds: . acetaminophen      . calcium carbonate      . carvedilol  6.25 mg Oral BID  . Chlorhexidine Gluconate Cloth  6 each Topical Daily  . citalopram  30 mg Oral QHS  . diphenhydrAMINE      . enoxaparin (LOVENOX) injection  60 mg Subcutaneous Q24H  . hydrALAZINE  5 mg Intravenous Q6H  . lubiprostone  24 mcg Oral BID WC  . pantoprazole  40 mg Oral Daily  . pramipexole  1.5 mg Oral QHS  . senna-docusate  1 tablet Oral BID   Continuous Infusions: . citrate dextrose       LOS: 10 days    Time spent:40 min    Ryver Zadrozny, Geraldo Docker, MD [facility] Pager [phone]  If 7PM-7AM, please contact night-coverage [email or web address] Password [facility] 08/21/2019, 3:11 PM

## 2019-08-22 LAB — CBC WITH DIFFERENTIAL/PLATELET
Abs Immature Granulocytes: 0.15 10*3/uL — ABNORMAL HIGH (ref 0.00–0.07)
Basophils Absolute: 0 10*3/uL (ref 0.0–0.1)
Basophils Relative: 0 %
Eosinophils Absolute: 0.4 10*3/uL (ref 0.0–0.5)
Eosinophils Relative: 5 %
HCT: 32.6 % — ABNORMAL LOW (ref 36.0–46.0)
Hemoglobin: 10.6 g/dL — ABNORMAL LOW (ref 12.0–15.0)
Immature Granulocytes: 2 %
Lymphocytes Relative: 34 %
Lymphs Abs: 3 10*3/uL (ref 0.7–4.0)
MCH: 30.7 pg (ref 26.0–34.0)
MCHC: 32.5 g/dL (ref 30.0–36.0)
MCV: 94.5 fL (ref 80.0–100.0)
Monocytes Absolute: 0.5 10*3/uL (ref 0.1–1.0)
Monocytes Relative: 6 %
Neutro Abs: 4.6 10*3/uL (ref 1.7–7.7)
Neutrophils Relative %: 53 %
Platelets: 188 10*3/uL (ref 150–400)
RBC: 3.45 MIL/uL — ABNORMAL LOW (ref 3.87–5.11)
RDW: 14 % (ref 11.5–15.5)
WBC: 8.8 10*3/uL (ref 4.0–10.5)
nRBC: 0 % (ref 0.0–0.2)

## 2019-08-22 LAB — COMPREHENSIVE METABOLIC PANEL
ALT: 9 U/L (ref 0–44)
AST: 11 U/L — ABNORMAL LOW (ref 15–41)
Albumin: 3.7 g/dL (ref 3.5–5.0)
Alkaline Phosphatase: 18 U/L — ABNORMAL LOW (ref 38–126)
Anion gap: 8 (ref 5–15)
BUN: 11 mg/dL (ref 8–23)
CO2: 21 mmol/L — ABNORMAL LOW (ref 22–32)
Calcium: 8.4 mg/dL — ABNORMAL LOW (ref 8.9–10.3)
Chloride: 109 mmol/L (ref 98–111)
Creatinine, Ser: 0.91 mg/dL (ref 0.44–1.00)
GFR calc Af Amer: >60 mL/min (ref >60)
GFR calc non Af Amer: >60 mL/min (ref >60)
Glucose, Bld: 102 mg/dL — ABNORMAL HIGH (ref 70–99)
Potassium: 3.8 mmol/L (ref 3.5–5.1)
Sodium: 138 mmol/L (ref 135–145)
Total Bilirubin: 0.6 mg/dL (ref 0.3–1.2)
Total Protein: 4.4 g/dL — ABNORMAL LOW (ref 6.5–8.1)

## 2019-08-22 LAB — MAGNESIUM: Magnesium: 1.8 mg/dL (ref 1.7–2.4)

## 2019-08-22 LAB — PHOSPHORUS: Phosphorus: 2.8 mg/dL (ref 2.5–4.6)

## 2019-08-22 NOTE — Progress Notes (Signed)
Physical Therapy Treatment Patient Details Name: Connie Haynes MRN: [id number] DOB: 1955/05/27 Today's Date: 08/22/2019    History of Present Illness Connie Haynes is a 64 y.o. 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  Undergoing further work up for transverse myelitis versus other deficiencies.    PT Comments    Pt progressing with ambulation distance today but needed standing rest breaks at 50' intervals. Min A to ambulate without RW, min-guard A with RW. Pt practiced one step, needed B rails to pull up for ascent, mod A to control descent back to RW due to decreased knee control. Pt practiced standing there ex EOB for strengthening and balance. PT will continue to follow.    Follow Up Recommendations  CIR (per physicican's request )  If this plan does not work out, recommend outpt PT     Equipment Recommendations  None recommended by PT    Recommendations for Other Services Rehab consult     Precautions / Restrictions Precautions Precautions: Fall Precaution Comments: decreased LE sensation (L>R) Restrictions Weight Bearing Restrictions: No    Mobility  Bed Mobility Overal bed mobility: Modified Independent Bed Mobility: Supine to Sit;Sit to Supine     Supine to sit: Modified independent (Device/Increase time) Sit to supine: Modified independent (Device/Increase time)   General bed mobility comments: pt able to get LE's off bed as well as back into bed with use of momentum  Transfers Overall transfer level: Needs assistance Equipment used: Rolling walker (2 wheeled);None Transfers: Sit to/from Stand Sit to Stand: Supervision Stand pivot transfers:  Supervision       General transfer comment: supervision with AD, mod I to RW with good hand placement  Ambulation/Gait Ambulation/Gait assistance: Min guard;Min assist Gait Distance (Feet): 50 Feet (4x with standing rest breaks in between) Assistive device: Rolling walker (2 wheeled);None Gait Pattern/deviations: Step-through pattern;Decreased step length - right;Decreased step length - left;Decreased stride length Gait velocity: decreased Gait velocity interpretation: <1.8 ft/sec, indicate of risk for recurrent falls General Gait Details: short distance in room without AD, unsteady with increased foot slap due to decreased sensation. Safer with use of RW, still apparent by gait that pt unable to feel floor knees to feet.    Stairs Stairs: Yes Stairs assistance: Mod assist Stair Management: Two rails;Step to pattern;Forwards Number of Stairs: 1 General stair comments: use of BUE's needed for pt to step up one step. Decreased knee control noted with descent back down 1 step, mod A for control to RW   Wheelchair Mobility    Modified Rankin (Stroke Patients Only)       Balance Overall balance assessment: Needs assistance Sitting-balance support: Feet supported Sitting balance-Leahy Scale: Normal     Standing balance support: During functional activity;No upper extremity supported;Single extremity supported;Bilateral upper extremity supported Standing balance-Leahy Scale: Fair Standing balance comment: worked on standing balance without support with standing ther ex               High Level Balance Comments: close min guard            Cognition Arousal/Alertness: Awake/alert Behavior During Therapy: WFL for tasks assessed/performed;Anxious Overall Cognitive Status: Within Functional Limits for tasks assessed  Exercises General Exercises - Lower Extremity Ankle Circles/Pumps: AROM;Both;20 reps;Supine Hip  Flexion/Marching: AROM;Strengthening;Both;Standing;5 reps Heel Raises: AROM;Strengthening;Both;10 reps;Standing;Limitations Heel Raises Limitations: unable to get heel clearance in standing    General Comments General comments (skin integrity, edema, etc.): pt's spouse present throughout session and mentions that he is with pt 24/7 as well as another family member      Pertinent Vitals/Pain Pain Assessment: No/denies pain    Home Living                      Prior Function            PT Goals (current goals can now be found in the care plan section) Acute Rehab PT Goals Patient Stated Goal: to return to normal; to be able to go hiking again (to see a waterfall) PT Goal Formulation: With patient Time For Goal Achievement: 08/25/19 Potential to Achieve Goals: Good Progress towards PT goals: Progressing toward goals    Frequency    Min 3X/week      PT Plan Discharge plan needs to be updated    Co-evaluation              AM-PAC PT "6 Clicks" Mobility   Outcome Measure  Help needed turning from your back to your side while in a flat bed without using bedrails?: None Help needed moving from lying on your back to sitting on the side of a flat bed without using bedrails?: None Help needed moving to and from a bed to a chair (including a wheelchair)?: None Help needed standing up from a chair using your arms (e.g., wheelchair or bedside chair)?: None Help needed to walk in hospital room?: A Little Help needed climbing 3-5 steps with a railing? : A Lot 6 Click Score: 21    End of Session Equipment Utilized During Treatment: Gait belt Activity Tolerance: Patient tolerated treatment well Patient left: in bed;with call bell/phone within reach Nurse Communication: Mobility status PT Visit Diagnosis: Other abnormalities of gait and mobility (R26.89);Difficulty in walking, not elsewhere classified (R26.2);Other symptoms and signs involving the nervous system  (R29.898)     Time: 3785-8850 PT Time Calculation (min) (ACUTE ONLY): 26 min  Charges:  $Gait Training: 23-37 mins                     [place]  Pager [phone] Office [place] 08/22/2019, 4:30 PM

## 2019-08-22 NOTE — Progress Notes (Signed)
Occupational Therapy Treatment and Discharge Patient Details Name: Connie Haynes MRN: [id number] DOB: December 15, 1955 Today's Date: 08/22/2019    History of present illness Connie Haynes is a 64 y.o. 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  Undergoing further work up for transverse myelitis versus other deficiencies.   OT comments  Pt motivated and agreeable to work with OT. Pt reports consistently completing BADLs using RW at [place], so OT guided pt in higher level IADL tasks. High level tasks needed to assess overall stability and safety due to continued difficulty with decreased LE sensation. Pt demonstrated ability to gather linens around room reaching above in closet and below to floor with distant supervision using RW. Pt demonstrated ability to stand 7-10 minutes to fold linens at bedside. Discussed CLOF, use of DME/AE at home, toileting schedule due to continued decreased pelvic sensation, and safety recommendations for return home with pt/husband verbalizing understanding. Pt has demonstrated ability to meet OT goals and ready for DC from skilled acute OT services. OT to sign off.    Follow Up Recommendations  No OT follow up;Supervision - Intermittent    Equipment Recommendations  None recommended by OT    Recommendations for Other Services      Precautions / Restrictions Precautions Precautions: Fall Precaution Comments: decreased LE sensation (L>R) Restrictions Weight Bearing Restrictions: No       Mobility Bed Mobility               General bed mobility comments: recieved sitting EOB  Transfers Overall transfer level: Needs  assistance Equipment used: Rolling walker (2 wheeled) Transfers: Sit to/from [facility] Sit to Stand: Modified independent (Device/Increase time) Stand pivot transfers: Supervision       General transfer comment: supervision with good technique utilized    Balance Overall balance assessment: Needs assistance Sitting-balance support: Feet supported Sitting balance-Leahy Scale: Normal     Standing balance support: During functional activity;No upper extremity supported;Single extremity supported;Bilateral upper extremity supported Standing balance-Leahy Scale: Fair Standing balance comment: pt with improved standing balance without support during activity                            ADL either performed or assessed with clinical judgement   ADL Overall ADL's : Modified independent                                       General ADL Comments: Pt has consistently been completing BADLs with RW and without staff assistance. Guided pt in IADL training with gathering blankets from floor and closet and folding standing at bedside with distant supervision, no LOB noted      Vision   Vision Assessment?: No apparent visual deficits   Perception     Praxis      Cognition Arousal/Alertness: Awake/alert Behavior During Therapy: WFL for tasks assessed/performed;Anxious Overall Cognitive Status: Within Functional Limits for tasks assessed  Exercises     Shoulder Instructions       General Comments Pt's spouse present during session. Pt denies any dizziness, improved ability noted to clear feet during mobility and less anxiety with tasks. Guided pt in IADL tasks without safety concersn    Pertinent Vitals/ Pain       Pain Assessment: No/denies pain  Home Living                                          Prior Functioning/Environment              Frequency   Min 2X/week        Progress Toward Goals  OT Goals(current goals can now be found in the care plan section)  Progress towards OT goals: Goals met/education completed, patient discharged from OT  Acute Rehab OT Goals Patient Stated Goal: to return to normal; to be able to go hiking again (to see a waterfall) OT Goal Formulation: With patient Time For Goal Achievement: 08/26/19 Potential to Achieve Goals: Good ADL Goals Pt Will Perform Grooming: with modified independence;standing Pt Will Perform Lower Body Bathing: with modified independence;sitting/lateral leans;sit to/from stand Pt Will Perform Lower Body Dressing: with modified independence;sit to/from stand;sitting/lateral leans Pt Will Transfer to Toilet: with modified independence;ambulating Pt Will Perform Toileting - Clothing Manipulation and hygiene: with modified independence;sit to/from stand;sitting/lateral leans Pt Will Perform Tub/Shower Transfer: Shower transfer;ambulating;3 in 1;with supervision  Plan Discharge plan remains appropriate;All goals met and education completed, patient discharged from OT services    Co-evaluation                 AM-PAC OT "6 Clicks" Daily Activity     Outcome Measure   Help from another person eating meals?: None Help from another person taking care of personal grooming?: None Help from another person toileting, which includes using toliet, bedpan, or urinal?: None Help from another person bathing (including washing, rinsing, drying)?: None Help from another person to put on and taking off regular upper body clothing?: None Help from another person to put on and taking off regular lower body clothing?: None 6 Click Score: 24    End of Session Equipment Utilized During Treatment: Gait belt;Rolling walker  OT Visit Diagnosis: Unsteadiness on feet (R26.81);Other abnormalities of gait and mobility (R26.89)   Activity Tolerance Patient tolerated treatment well   Patient Left  with call bell/phone within reach;with family/visitor present   Nurse Communication          Time: 1517-6160 OT Time Calculation (min): 23 min  Charges: OT General Charges $OT Visit: 1 Visit OT Treatments $Self Care/Home Management : 8-22 mins $Therapeutic Activity: 8-22 mins  Layla Maw, OTR/L   Layla Maw 08/22/2019, 2:52 PM

## 2019-08-22 NOTE — Progress Notes (Signed)
Rehab Admissions Coordinator Note:  Per PT recommendation, this patient was screened by Raechel Ache for appropriateness for an Inpatient Acute Rehab Consult.  At this time, the pt has met her acute OT goals and is Mod I in most ADLs, requiring no follow up. Pt is already performing at a supervision/Mod I level for transfers and Min G/Min A for gait with PT. Given her current high functional level and the need for only one follow up discipline at this time, this pt would not qualify for an IP Rehab program. [facility] will not pursue CIR for this patient.   Raechel Ache 08/22/2019, 6:31 PM  I can be reached at [phone].

## 2019-08-22 NOTE — Progress Notes (Signed)
PROGRESS NOTE    Connie Haynes  ZDG:[id number] DOB: 01/06/1956 DOA: 08/10/2019 PCP: [name], MD     Brief Narrative:  64 y.o.WF PMHx CKD stage III, adrenal insufficiency, history of spinal cord stroke with no residual deficitsS/pneck surgery(about 10 years ago), morbid obesity, HTN, HLD,   Presents to the emergency department due to 1 week onset of bilateral lower extremity numbness and weakness as well as urinary retention. CSF Prelim results not suggestive of infectious etiology. Not convincing for inflammatory etiology either. Per neuro an longitudinally extensive inflammatory myelitis is the most likely etiology for the patient's dorsal spinal cord lesion with patchy enhancement on MRI.   Currently plan is continue IV steroids.   Subjective: 7/13 A/O x4, negative CP, negative abdominal pain, negative S OB.  Worked out with PT today, only able to walk short distance.  Scheduled for #5 plasma exchange tomorrow this will be her final exchange per neurology.  Patient still without any SSX of adrenal insufficiency i.e. negative hypotension, negative hyperkalemia, negative hypercalcemia.  Continue to monitor closely.    Assessment & Plan: Covid vaccination; positive vaccination   Principal Problem:   Transverse myelitis ([place]) Active Problems:   Essential hypertension, benign   Morbid obesity ([place])   Adrenal insufficiency (Addison's disease) ([place])   Transverse myelitis -Neuromyelitis antibody positive -7/1 s/p LP; negative for infection. -ACElevels,ANA, RF,SSA, SSB,B12, DsDNA, neuromyelitis optica autoantibodyIgGand anti-Jo 1 antibody were ordered.  Pending -Neuro started the pt on high dose 1gm IV solumedrol x 5 days. -7/2 MRI Brain shows right sided inferior frontal lobe/medial temporal lobe increased T2 and FLAIR signal and a 9 mm focus of contrast enhancement suggestive of subacute infarction versus demyelinating disease versus mass. But based on the spinal cord  abnormalities shown yesterday, demyelinating disease is favored. -Fall precautions -7/7 s/p.  Plasma exchange x1.  Today will be a holiday, and plan is to restart tomorrow -7/12 plasma Exchange #4 completed  Essential hypertension -Coreg BID -7/10 Hydralazine 5 mg QID  GERD -Continue Protonix  Adrenal insufficiency -Hold home hydrocortisone while on IV steroids.  -7/10 continue to hold home Solu-Cortef, BP still slightly high will need to monitor closely.  Currently patient does not appear to be suffering from any side effects from adrenal insufficiency nor do her labs reflect adrenal insufficiency i.e hyponatremia, hyperkalemia, hypercalcemia.  May be hard to detect because of plasma exchange.  -7/10 suggested we start patient back on Solu-Cortef, she would like to wait until 7/11 which is rest day off plasma exchange, if she still has the same feelings as she previously had in 2018 agree to restart medication. -7/11 patient feeling much better today would like to continue to hold off on restarting Solu-Cortef.  Understands that if her BP drops or she begins to experience hyperkalemia, hypercalcemia, or hypotension we will immediately need to restart medication.  Agrees with plan.  Morbid obesity -Outpatient weight 122kg.   Anxiety and tremors -7/11 increase Ativan 1 mg PRN -7/11 increase Celexa 30 mg daily  HLD  -7/8 LDL= 29  H/o CVA No residual deficit  Monitor   OSA -CPAP per respiratory  Restless leg syndrome -Pramipexole 1.5 mg daily   CKD stage II Recent Labs  Lab 08/19/19 1008 08/20/19 0206 08/21/19 0308 08/21/19 0800 08/22/19 0358  CREATININE 0.97 0.93 1.01* 0.96 0.91  -Follows up with nephrology as outpatient  -Stable  Hypokalemia -Potassium goal> 4   Hypophosphatemia --Resolved     Goals of care -7/13 PT/OT reevaluate patient; I  believe the best place for this patient is CIR please reevaluate patient and if possible place CIR as first  choice and Pamala Hurry has agreed     DVT prophylaxis: Lovenox Code Status: Full Family Communication: 7/13 husband at bedside for discussion of plan of care.  Really desires to go to CIR for rehab and appears to have the motivation to do well.  VERY APPREHENSIVE about going to outside SNF do not believe patient would do well there. Status is: Inpatient    Dispo: The patient is from: Home              Anticipated d/c is to: Home              Anticipated d/c date is: Neurology              Patient currently unstable      Consultants:  Neurology   Procedures/Significant Events:  7/1 s/p LP 7/1 MRI C-spine, T-spine, L-spine W0 contrast;-Abnormal cord signal extending superiorly from the T3-T4 level into the cervical spine -Multilevel degenerative changes. 7/2 LP.  Opening pressure 20 cm H2O 7/2 MRI brain W/W0 contrast;Abnormality at the inferior frontal lobe/medial temporal lobe on the right with a region of increased T2 and FLAIR signal and a 9 mm focus of contrast enhancement. Differential diagnosis is late subacute infarction versus demyelinating disease versus mass. Based on the spinal cord abnormalities shown yesterday, demyelinating disease is favored. A focus of cerebritis seems least likely. Neuro sarcoid is also being considered in the clinical differential diagnosis of the spinal disease, which could possibly be consistent with this brain lesion.  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 7/6 CT chest W0 contrast;No definite thoracic adenopathy. -5 x 10 mm RIGHT middle lobe nodule image 69; consider one of the following in 3 months for both low-risk and high-risk individuals: (a) repeat chest CT,    I have personally reviewed and interpreted all radiology studies and my findings are as above.  VENTILATOR SETTINGS:    Cultures 7/1 CSF negative VDRL 7/1 CSF negative final  7/1 blood HSV 1/2  Negative 7/1  urine negative final 7/5 blood RPR negative  Antimicrobials:    Devices    LINES / TUBES:      Continuous Infusions:    Objective: Vitals:   08/21/19 1742 08/22/19 0038 08/22/19 0521 08/22/19 1100  BP: (!) 138/59 (!) 105/50 (!) 120/56 (!) 108/52  Pulse: 84 70 67   Resp: 17 17 16 18   Temp: 99.5 F (37.5 C) 97.9 F (36.6 C) 98.3 F (36.8 C) 97.8 F (36.6 C)  TempSrc: Oral Oral Oral Oral  SpO2: 96% 97% 97% 97%  Weight:      Height:        Intake/Output Summary (Last 24 hours) at 08/22/2019 1324 Last data filed at 08/22/2019 0325 Gross per 24 hour  Intake 240 ml  Output --  Net 240 ml   Filed Weights   08/14/19 1900 08/18/19 0944 08/19/19 0623  Weight: 127 kg 125.6 kg 126.6 kg    Physical Exam:  General: A/O x4, No acute respiratory distress Eyes: negative scleral hemorrhage, negative anisocoria, negative icterus ENT: Negative Runny nose, negative gingival bleeding, Neck:  Negative scars, masses, torticollis, lymphadenopathy, JVD Lungs: Clear to auscultation bilaterally without wheezes or crackles Cardiovascular: Regular rate and rhythm without murmur gallop or rub normal S1 and S2 Abdomen: MORBIDLY OBESE, negative abdominal pain, nondistended, positive soft, bowel sounds, no rebound, no ascites,  no appreciable mass Extremities: No significant cyanosis, clubbing, or edema bilateral lower extremities Skin: Negative rashes, lesions, ulcers Psychiatric:  Negative depression, negative anxiety, negative fatigue, negative mania  Central nervous system:  Cranial nerves II through XII intact, tongue/uvula midline, all extremities muscle strength 5/5, sensation intact throughout, negative dysarthria, negative expressive aphasia, negative receptive aphasia.   Data Reviewed: Care during the described time interval was provided by me .  I have reviewed this patient's available data, including medical history, events of note, physical examination, and all test results as  part of my evaluation.  CBC: Recent Labs  Lab 08/18/19 0349 08/19/19 0356 08/20/19 0206 08/21/19 0308 08/22/19 0358  WBC 10.5 12.7* 14.4* 10.2 8.8  NEUTROABS 6.6 11.0* 9.5* 5.6 4.6  HGB 12.5 12.7 11.8* 10.9* 10.6*  HCT 38.3 38.0 35.6* 33.6* 32.6*  MCV 91.6 92.5 93.2 93.3 94.5  PLT 236 224 224 198 585   Basic Metabolic Panel: Recent Labs  Lab 08/18/19 0349 08/18/19 0349 08/18/19 1846 08/18/19 1846 08/19/19 0356 08/19/19 0356 08/19/19 1008 08/20/19 0206 08/21/19 0308 08/21/19 0800 08/22/19 0358  NA 137   < > 134*   < > 137   < > 136 136 136 136 138  K 3.1*   < > 4.0   < > 4.7   < > 4.1 4.3 3.3* 3.1* 3.8  CL 103   < > 106   < > 108   < > 105 110 106 106 109  CO2 26   < > 19*   < > 22   < > 21* 19* 22 21* 21*  GLUCOSE 102*   < > 251*   < > 178*   < > 226* 111* 109* 113* 102*  BUN 18   < > 19   < > 16   < > 17 20 14 14 11   CREATININE 0.92   < > 1.20*   < > 0.87   < > 0.97 0.93 1.01* 0.96 0.91  CALCIUM 8.3*   < > 7.9*   < > 8.1*   < > 8.3* 8.2* 8.2* 8.3* 8.4*  MG 2.2   < > 1.7  --  2.0  --   --  1.9 1.8  --  1.8  PHOS 2.8  --   --   --  2.6  --   --  2.1* 3.1  --  2.8   < > = values in this interval not displayed.   GFR: Estimated Creatinine Clearance: 81.4 mL/min (by C-G formula based on SCr of 0.91 mg/dL). Liver Function Tests: Recent Labs  Lab 08/18/19 0349 08/19/19 0356 08/20/19 0206 08/21/19 0308 08/22/19 0358  AST 16 11* 14* 13* 11*  ALT 22 12 8 11 9   ALKPHOS 36* 21* 13* 21* 18*  BILITOT 0.7 0.8 0.6 1.1 0.6  PROT 5.3* 4.8* 4.4* 4.7* 4.4*  ALBUMIN 3.6 3.9 3.7 3.7 3.7   No results for input(s): LIPASE, AMYLASE in the last 168 hours. No results for input(s): AMMONIA in the last 168 hours. Coagulation Profile: No results for input(s): INR, PROTIME in the last 168 hours. Cardiac Enzymes: No results for input(s): CKTOTAL, CKMB, CKMBINDEX, TROPONINI in the last 168 hours. BNP (last 3 results) No results for input(s): PROBNP in the last 8760  hours. HbA1C: No results for input(s): HGBA1C in the last 72 hours. CBG: No results for input(s): GLUCAP in the last 168 hours. Lipid Profile: No results for input(s): CHOL, HDL, LDLCALC, TRIG, CHOLHDL, LDLDIRECT in  the last 72 hours. Thyroid Function Tests: No results for input(s): TSH, T4TOTAL, FREET4, T3FREE, THYROIDAB in the last 72 hours. Anemia Panel: No results for input(s): VITAMINB12, FOLATE, FERRITIN, TIBC, IRON, RETICCTPCT in the last 72 hours. Sepsis Labs: No results for input(s): PROCALCITON, LATICACIDVEN in the last 168 hours.  No results found for this or any previous visit (from the past 240 hour(s)).       Radiology Studies: No results found.      Scheduled Meds: . carvedilol  6.25 mg Oral BID  . Chlorhexidine Gluconate Cloth  6 each Topical Daily  . citalopram  30 mg Oral QHS  . enoxaparin (LOVENOX) injection  60 mg Subcutaneous Q24H  . hydrALAZINE  5 mg Intravenous Q6H  . lubiprostone  24 mcg Oral BID WC  . pantoprazole  40 mg Oral Daily  . potassium chloride  50 mEq Oral BID  . pramipexole  1.5 mg Oral QHS  . senna-docusate  1 tablet Oral BID   Continuous Infusions:    LOS: 11 days    Time spent:40 min    Blayton Huttner, Geraldo Docker, MD [facility] Pager [phone]  If 7PM-7AM, please contact night-coverage [email or web address] Password [facility] 08/22/2019, 1:24 PM

## 2019-08-23 DIAGNOSIS — E876 Hypokalemia: Secondary | ICD-10-CM

## 2019-08-23 LAB — COMPREHENSIVE METABOLIC PANEL
ALT: 12 U/L (ref 0–44)
AST: 14 U/L — ABNORMAL LOW (ref 15–41)
Albumin: 3.6 g/dL (ref 3.5–5.0)
Alkaline Phosphatase: 20 U/L — ABNORMAL LOW (ref 38–126)
Anion gap: 8 (ref 5–15)
BUN: 10 mg/dL (ref 8–23)
CO2: 22 mmol/L (ref 22–32)
Calcium: 8.5 mg/dL — ABNORMAL LOW (ref 8.9–10.3)
Chloride: 108 mmol/L (ref 98–111)
Creatinine, Ser: 0.84 mg/dL (ref 0.44–1.00)
GFR calc Af Amer: >60 mL/min (ref >60)
GFR calc non Af Amer: >60 mL/min (ref >60)
Glucose, Bld: 105 mg/dL — ABNORMAL HIGH (ref 70–99)
Potassium: 3.4 mmol/L — ABNORMAL LOW (ref 3.5–5.1)
Sodium: 138 mmol/L (ref 135–145)
Total Bilirubin: 0.6 mg/dL (ref 0.3–1.2)
Total Protein: 4.7 g/dL — ABNORMAL LOW (ref 6.5–8.1)

## 2019-08-23 LAB — CBC WITH DIFFERENTIAL/PLATELET
Abs Immature Granulocytes: 0.09 10*3/uL — ABNORMAL HIGH (ref 0.00–0.07)
Basophils Absolute: 0 10*3/uL (ref 0.0–0.1)
Basophils Relative: 0 %
Eosinophils Absolute: 0.4 10*3/uL (ref 0.0–0.5)
Eosinophils Relative: 5 %
HCT: 31.9 % — ABNORMAL LOW (ref 36.0–46.0)
Hemoglobin: 10.2 g/dL — ABNORMAL LOW (ref 12.0–15.0)
Immature Granulocytes: 1 %
Lymphocytes Relative: 29 %
Lymphs Abs: 2.2 10*3/uL (ref 0.7–4.0)
MCH: 30.1 pg (ref 26.0–34.0)
MCHC: 32 g/dL (ref 30.0–36.0)
MCV: 94.1 fL (ref 80.0–100.0)
Monocytes Absolute: 0.5 10*3/uL (ref 0.1–1.0)
Monocytes Relative: 6 %
Neutro Abs: 4.4 10*3/uL (ref 1.7–7.7)
Neutrophils Relative %: 59 %
Platelets: 172 10*3/uL (ref 150–400)
RBC: 3.39 MIL/uL — ABNORMAL LOW (ref 3.87–5.11)
RDW: 14 % (ref 11.5–15.5)
WBC: 7.5 10*3/uL (ref 4.0–10.5)
nRBC: 0 % (ref 0.0–0.2)

## 2019-08-23 LAB — MAGNESIUM: Magnesium: 2 mg/dL (ref 1.7–2.4)

## 2019-08-23 LAB — PHOSPHORUS: Phosphorus: 2.9 mg/dL (ref 2.5–4.6)

## 2019-08-23 MED ORDER — DIPHENHYDRAMINE HCL 25 MG PO CAPS
25.0000 mg | ORAL_CAPSULE | Freq: Four times a day (QID) | ORAL | Status: DC | PRN
  Administered 2019-08-23: 25 mg via ORAL

## 2019-08-23 MED ORDER — POTASSIUM CHLORIDE CRYS ER 20 MEQ PO TBCR
40.0000 meq | EXTENDED_RELEASE_TABLET | Freq: Once | ORAL | Status: AC
  Administered 2019-08-23: 40 meq via ORAL
  Filled 2019-08-23: qty 2

## 2019-08-23 MED ORDER — ACD FORMULA A 0.73-2.45-2.2 GM/100ML VI SOLN
1000.0000 mL | Status: DC
  Filled 2019-08-23: qty 1000

## 2019-08-23 MED ORDER — CALCIUM CARBONATE ANTACID 500 MG PO CHEW
2.0000 | CHEWABLE_TABLET | ORAL | Status: AC
  Administered 2019-08-23: 400 mg via ORAL

## 2019-08-23 MED ORDER — DIPHENHYDRAMINE HCL 25 MG PO CAPS
ORAL_CAPSULE | ORAL | Status: AC
  Filled 2019-08-23: qty 1

## 2019-08-23 MED ORDER — HEPARIN SODIUM (PORCINE) 1000 UNIT/ML IJ SOLN
1000.0000 [IU] | Freq: Once | INTRAMUSCULAR | Status: AC
  Filled 2019-08-23: qty 1

## 2019-08-23 MED ORDER — CALCIUM CARBONATE ANTACID 500 MG PO CHEW
CHEWABLE_TABLET | ORAL | Status: AC
  Administered 2019-08-23: 400 mg via ORAL
  Filled 2019-08-23: qty 4

## 2019-08-23 MED ORDER — SODIUM CHLORIDE 0.9 % IV SOLN
INTRAVENOUS | Status: AC
  Filled 2019-08-23 (×3): qty 200

## 2019-08-23 MED ORDER — ACETAMINOPHEN 325 MG PO TABS
650.0000 mg | ORAL_TABLET | ORAL | Status: DC | PRN
  Administered 2019-08-23: 650 mg via ORAL

## 2019-08-23 MED ORDER — CALCIUM GLUCONATE-NACL 2-0.675 GM/100ML-% IV SOLN
2.0000 g | Freq: Once | INTRAVENOUS | Status: AC
  Administered 2019-08-23: 2000 mg via INTRAVENOUS
  Filled 2019-08-23: qty 100

## 2019-08-23 MED ORDER — ACETAMINOPHEN 325 MG PO TABS
ORAL_TABLET | ORAL | Status: AC
  Filled 2019-08-23: qty 2

## 2019-08-23 MED ORDER — HEPARIN SODIUM (PORCINE) 1000 UNIT/ML IJ SOLN
INTRAMUSCULAR | Status: AC
  Administered 2019-08-23: 2600 [IU]
  Filled 2019-08-23: qty 4

## 2019-08-23 NOTE — Progress Notes (Signed)
Neurology Progress Note   S:// Seen and examined Walking with a walker today. Feels much better and stronger than yesterday  O:// Current vital signs: BP (!) 123/55 (BP Location: Left Arm)   Pulse 63   Temp 98.1 F (36.7 C) (Oral)   Resp 16   Ht 5\' 5"  (1.651 m)   Wt 126.6 kg   SpO2 94%   BMI 46.44 kg/m  Vital signs in last 24 hours: Temp:  [98.1 F (36.7 C)-98.6 F (37 C)] 98.1 F (36.7 C) (07/14 0427) Pulse Rate:  [54-77] 63 (07/14 0427) Resp:  [16-18] 16 (07/14 0427) BP: (107-146)/(46-55) 123/55 (07/14 0427) SpO2:  [94 %-99 %] 94 % (07/14 0022) Neurological exam Awake alert oriented x3 No dysarthria No aphasia Cranial nerve examination: Pupils equal round reactive light, extraocular movements intact, visual fields full, facial sensation intact, face symmetric, auditory acuity intact, tongue and palate midline. Motor exam: Bilateral upper extremities 5/5.  Bilateral lower extremities 4+/5 right hip and 4+/5 plantarflexion and 5/5 dorsiflexion bilaterally. Sensory exam: Continues to have extremely diminished sensations from knee below which is very symmetric and I suspect some sort of long fiber neuropathy might also be playing a role here. Coordination: No dysmetria Deep tendon reflexes: 1-2+ symmetric bilateral ankle jerks, could not elicit knee jerks bilaterally, 1+ right brachioradialis, unable to elicit left brachioradialis or biceps reflexes. Gait: Walking with a walker relatively comfortably but with a cautious gait.  Medications  Current Facility-Administered Medications:  .  acetaminophen (TYLENOL) tablet 650 mg, 650 mg, Oral, Q6H PRN, Lavina Hamman, MD, 650 mg at 08/16/19 1920 .  carvedilol (COREG) tablet 6.25 mg, 6.25 mg, Oral, BID, Lavina Hamman, MD, 6.25 mg at 08/23/19 0809 .  Chlorhexidine Gluconate Cloth 2 % PADS 6 each, 6 each, Topical, Daily, Allie Bossier, MD, 6 each at 08/23/19 0810 .  citalopram (CELEXA) tablet 30 mg, 30 mg, Oral, QHS, Allie Bossier, MD, 30 mg at 08/22/19 2045 .  enoxaparin (LOVENOX) injection 60 mg, 60 mg, Subcutaneous, Q24H, Skeet Simmer, RPH, 60 mg at 08/22/19 2045 .  hydrALAZINE (APRESOLINE) injection 5 mg, 5 mg, Intravenous, Q6H, Allie Bossier, MD, 5 mg at 08/20/19 1708 .  LORazepam (ATIVAN) tablet 1 mg, 1 mg, Oral, Q4H PRN, Allie Bossier, MD .  lubiprostone (AMITIZA) capsule 24 mcg, 24 mcg, Oral, BID WC, Allie Bossier, MD, 24 mcg at 08/23/19 0809 .  ondansetron (ZOFRAN) injection 4 mg, 4 mg, Intravenous, Q6H PRN, Lavina Hamman, MD, 4 mg at 08/18/19 6213 .  oxyCODONE (Oxy IR/ROXICODONE) immediate release tablet 5-15 mg, 5-15 mg, Oral, Q4H PRN, Lavina Hamman, MD, 5 mg at 08/22/19 2046 .  pantoprazole (PROTONIX) EC tablet 40 mg, 40 mg, Oral, Daily, Skeet Simmer, RPH, 40 mg at 08/23/19 0809 .  pramipexole (MIRAPEX) tablet 1.5 mg, 1.5 mg, Oral, QHS, Lavina Hamman, MD, 1.5 mg at 08/22/19 2045 .  senna-docusate (Senokot-S) tablet 1 tablet, 1 tablet, Oral, BID, Lavina Hamman, MD, 1 tablet at 08/23/19 0809 .  sodium chloride flush (NS) 0.9 % injection 10-40 mL, 10-40 mL, Intracatheter, PRN, Allie Bossier, MD .  tiZANidine (ZANAFLEX) tablet 6 mg, 6 mg, Oral, Q8H PRN, Lavina Hamman, MD, 6 mg at 08/22/19 0865  Facility-Administered Medications Ordered in Other Encounters:  .  chlorhexidine (HIBICLENS) 4 % liquid 4 application, 60 mL, Topical, Once, Dixon, Robb Matar, PA-C .  technetium tetrofosmin (TC-MYOVIEW) injection 31.7 milli Curie, 78.4 millicurie, Intravenous, Once PRN,  Dorothy Spark, MD Labs CBC    Component Value Date/Time   WBC 7.5 08/23/2019 0416   RBC 3.39 (L) 08/23/2019 0416   HGB 10.2 (L) 08/23/2019 0416   HCT 31.9 (L) 08/23/2019 0416   PLT 172 08/23/2019 0416   MCV 94.1 08/23/2019 0416   MCH 30.1 08/23/2019 0416   MCHC 32.0 08/23/2019 0416   RDW 14.0 08/23/2019 0416   LYMPHSABS 2.2 08/23/2019 0416   MONOABS 0.5 08/23/2019 0416   EOSABS 0.4 08/23/2019 0416    BASOSABS 0.0 08/23/2019 0416    CMP     Component Value Date/Time   NA 138 08/23/2019 0416   K 3.4 (L) 08/23/2019 0416   CL 108 08/23/2019 0416   CO2 22 08/23/2019 0416   GLUCOSE 105 (H) 08/23/2019 0416   BUN 10 08/23/2019 0416   CREATININE 0.84 08/23/2019 0416   CREATININE 0.87 06/18/2015 1207   CALCIUM 8.5 (L) 08/23/2019 0416   PROT 4.7 (L) 08/23/2019 0416   ALBUMIN 3.6 08/23/2019 0416   ALBUMIN 4.0 08/10/2019 2356   AST 14 (L) 08/23/2019 0416   ALT 12 08/23/2019 0416   ALKPHOS 20 (L) 08/23/2019 0416   BILITOT 0.6 08/23/2019 0416   GFRNONAA >60 08/23/2019 0416   GFRAA >60 08/23/2019 0416   Pertinent Labs: CSF WBC 4 CSF RBC 15 CSF Protein 48 CSF Glucose 49 CSF HSV negative  OC Bands - normal IgG index - normal CSF ACE - normal MOG, NMO - negative.  Serum ACE - 52, normal RNP, SSA, SSB, SCL-70, ENA SM, ds DNA all normal Jo-1 normal RF normal B12 376 TSH normal Homocysteine - normal RPR negative Ceruloplasmin normal.   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 Completing her plasma exchange last round today.  Exam considerably improved from initial.  Impression: -Longitudinally extensive myelitis  Recommendations:  -Complete 5 rounds of plasma exchange today -PLEX cath removal by IR (order placed for tomorrow and spoke with IR staff member) -PT OT  -Follow with [facility] Ar[facility] Colt [facility] in 2-4 weeks  We will be available as needed.  Please call with questions.  -- Amie Portland, MD [facility] Pager: [phone] If 7pm to 7am, please call on call as listed on AMION.

## 2019-08-23 NOTE — Progress Notes (Addendum)
PROGRESS NOTE    Connie Haynes  RXV:[id number] DOB: 1955-07-10 DOA: 08/10/2019 PCP: [name], MD     Brief Narrative:  64 y.o.WF PMHx CKD stage III, adrenal insufficiency, history of spinal cord stroke with no residual deficitsS/pneck surgery(about 10 years ago), morbid obesity, HTN, HLD.  Presented to the emergency department with 1 week history of bilateral lower extremity numbness and weakness.  Concern was for transverse myelitis.  Patient was seen by neurology.  Started on IV steroids and plasma exchange.  Subjective: Patient mentions that her legs feel better.  She is able to move them better than before.  She is supposed to get her fifth treatment with plasma exchange today.      Assessment & Plan:  Transverse myelitis -Neuromyelitis antibody positive -7/1 s/p LP; negative for infection. -ACElevels,ANA, RF,SSA, SSB,B12, DsDNA, neuromyelitis optica autoantibodyIgGand anti-Jo 1 antibody were ordered.  Pending.  Can be followed up at the time of her follow-up with neurology.   -Patient was given IV Solu-Medrol for 5 days.   -7/2 MRI Brain shows right sided inferior frontal lobe/medial temporal lobe increased T2 and FLAIR signal and a 9 mm focus of contrast enhancement suggestive of subacute infarction versus demyelinating disease versus mass. But based on the spinal cord abnormalities shown yesterday, demyelinating disease is favored. -Also started on plasma exchange.  Today will be the fifth treatment.  Catheter to be removed tomorrow and she should be able to go home tomorrow.    Essential hypertension Stable.  Continue home medications.    GERD Continue Protonix  Normocytic anemia Hemoglobin stable.  Likely due to chronic disease.  No evidence of overt blood loss.  Adrenal insufficiency Patient usually takes hydrocortisone at home.  This was held while she was getting high-dose steroids here in the hospital. Can resume at discharge.  Morbid obesity  Estimated body mass index is 46.44 kg/m as calculated from the following:   Height as of this encounter: 5\' 5"  (1.651 m).   Weight as of this encounter: 126.6 kg.  Anxiety and tremors Stable.  Continue home medications including Celexa.  H/o CVA No residual deficits.  Not noted to be on any antiplatelet agent.  OSA -CPAP   Restless leg syndrome -Pramipexole 1.5 mg daily   CKD stage II -Follows up with nephrology as outpatient  -Stable  Hypokalemia Replace potassium  Hypophosphatemia --Resolved   DVT prophylaxis: Lovenox Code Status: Full Family Communication: Discussed with the patient.  Since patient has shown improvement and her level of functioning she should be able to go home and do outpatient physical therapy.    Status is: Inpatient  Remains inpatient appropriate because:IV treatments appropriate due to intensity of illness or inability to take PO   Dispo: The patient is from: Home              Anticipated d/c is to: Home              Anticipated d/c date is: 1 day              Patient currently is not medically stable to d/c.     Consultants:  Neurology   Procedures/Significant Events:  7/1 s/p LP 7/1 MRI C-spine, T-spine, L-spine W0 contrast;-Abnormal cord signal extending superiorly from the T3-T4 level into the cervical spine -Multilevel degenerative changes. 7/2 LP.  Opening pressure 20 cm H2O 7/2 MRI brain W/W0 contrast;Abnormality at the inferior frontal lobe/medial temporal lobe on the right with a region of increased T2 and  FLAIR signal and a 9 mm focus of contrast enhancement. Differential diagnosis is late subacute infarction versus demyelinating disease versus mass. Based on the spinal cord abnormalities shown yesterday, demyelinating disease is favored. A focus of cerebritis seems least likely. Neuro sarcoid is also being considered in the clinical differential diagnosis of the spinal disease, which could possibly be consistent with  this brain lesion. 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  7/6 CT chest W0 contrast;No definite thoracic adenopathy. -5 x 10 mm RIGHT middle lobe nodule image 69; consider one of the following in 3 months for both low-risk and high-risk individuals: (a) repeat chest CT,   Cultures 7/1 CSF negative VDRL 7/1 CSF negative final  7/1 blood HSV 1/2  Negative 7/1 urine negative final 7/5 blood RPR negative    Objective: Vitals:   08/22/19 1100 08/22/19 1800 08/23/19 0022 08/23/19 0427  BP: (!) 108/52 (!) 146/49 (!) 107/46 (!) 123/55  Pulse:  77 (!) 54 63  Resp: 18 18 17 16   Temp: 97.8 F (36.6 C) 98.4 F (36.9 C) 98.6 F (37 C) 98.1 F (36.7 C)  TempSrc: Oral Oral Oral Oral  SpO2: 97% 99% 94%   Weight:      Height:        Intake/Output Summary (Last 24 hours) at 08/23/2019 1156 Last data filed at 08/23/2019 0312 Gross per 24 hour  Intake 240 ml  Output -  Net 240 ml   Filed Weights   08/14/19 1900 08/18/19 0944 08/19/19 0623  Weight: 127 kg 125.6 kg 126.6 kg    Physical Exam:  General appearance: Awake alert.  In no distress Resp: Clear to auscultation bilaterally.  Normal effort Cardio: S1-S2 is normal regular.  No S3-S4.  No rubs murmurs or bruit GI: Abdomen is soft.  Nontender nondistended.  Bowel sounds are present normal.  No masses organomegaly Extremities: No edema.  Full range of motion of lower extremities. Neurologic: Alert and oriented x3.  Improved strength in bilateral lower extremities.   Lab Data:  CBC: Recent Labs  Lab 08/19/19 0356 08/20/19 0206 08/21/19 0308 08/22/19 0358 08/23/19 0416  WBC 12.7* 14.4* 10.2 8.8 7.5  NEUTROABS 11.0* 9.5* 5.6 4.6 4.4  HGB 12.7 11.8* 10.9* 10.6* 10.2*  HCT 38.0 35.6* 33.6* 32.6* 31.9*  MCV 92.5 93.2 93.3 94.5 94.1  PLT 224 224 198 188 024   Basic Metabolic Panel: Recent Labs  Lab 08/19/19 0356 08/19/19 1008 08/20/19 0206 08/21/19 0308 08/21/19  0800 08/22/19 0358 08/23/19 0416  NA 137   < > 136 136 136 138 138  K 4.7   < > 4.3 3.3* 3.1* 3.8 3.4*  CL 108   < > 110 106 106 109 108  CO2 22   < > 19* 22 21* 21* 22  GLUCOSE 178*   < > 111* 109* 113* 102* 105*  BUN 16   < > 20 14 14 11 10   CREATININE 0.87   < > 0.93 1.01* 0.96 0.91 0.84  CALCIUM 8.1*   < > 8.2* 8.2* 8.3* 8.4* 8.5*  MG 2.0  --  1.9 1.8  --  1.8 2.0  PHOS 2.6  --  2.1* 3.1  --  2.8 2.9   < > = values in this interval not displayed.   GFR: Estimated Creatinine Clearance: 88.2 mL/min (by C-G formula based on SCr of 0.84 mg/dL). Liver Function Tests: Recent Labs  Lab 08/19/19 0356 08/20/19 0206 08/21/19 0308 08/22/19 0973  08/23/19 0416  AST 11* 14* 13* 11* 14*  ALT 12 8 11 9 12   ALKPHOS 21* 13* 21* 18* 20*  BILITOT 0.8 0.6 1.1 0.6 0.6  PROT 4.8* 4.4* 4.7* 4.4* 4.7*  ALBUMIN 3.9 3.7 3.7 3.7 3.6      Radiology Studies: No results found.    Scheduled Meds: . carvedilol  6.25 mg Oral BID  . Chlorhexidine Gluconate Cloth  6 each Topical Daily  . citalopram  30 mg Oral QHS  . enoxaparin (LOVENOX) injection  60 mg Subcutaneous Q24H  . hydrALAZINE  5 mg Intravenous Q6H  . lubiprostone  24 mcg Oral BID WC  . pantoprazole  40 mg Oral Daily  . pramipexole  1.5 mg Oral QHS  . senna-docusate  1 tablet Oral BID   Continuous Infusions:    LOS: 12 days    Time spent:40 min    Bonnielee Haff, MD [facility]   If 7PM-7AM, please contact night-coverage [email or web address] Password [facility] 08/23/2019, 11:56 AM

## 2019-08-23 NOTE — Progress Notes (Signed)
Pt due to receive final plasma exchange today. Called unit and spoke with pt's nurse regarding [place] dsg change. Pt's nurse to put in consult if pt will keep [facility] so that dsg change can be completed.

## 2019-08-23 NOTE — Progress Notes (Signed)
VAST spoke with patient regarding dressing change to [facility] [place] dressing change. Dressing remains intact and occlusive. Pt wishes to leave current dressing in place even though it is due to be changed today; [place] line will be discontinued in the morning prior to her discharge. Unit RN notified.

## 2019-08-24 ENCOUNTER — Inpatient Hospital Stay (HOSPITAL_COMMUNITY): Payer: Medicare Other

## 2019-08-24 LAB — BASIC METABOLIC PANEL
Anion gap: 5 (ref 5–15)
BUN: 7 mg/dL — ABNORMAL LOW (ref 8–23)
CO2: 22 mmol/L (ref 22–32)
Calcium: 8 mg/dL — ABNORMAL LOW (ref 8.9–10.3)
Chloride: 110 mmol/L (ref 98–111)
Creatinine, Ser: 0.97 mg/dL (ref 0.44–1.00)
GFR calc Af Amer: >60 mL/min (ref >60)
GFR calc non Af Amer: >60 mL/min (ref >60)
Glucose, Bld: 110 mg/dL — ABNORMAL HIGH (ref 70–99)
Potassium: 3.7 mmol/L (ref 3.5–5.1)
Sodium: 137 mmol/L (ref 135–145)

## 2019-08-24 LAB — CBC WITH DIFFERENTIAL/PLATELET
Abs Immature Granulocytes: 0.08 10*3/uL — ABNORMAL HIGH (ref 0.00–0.07)
Basophils Absolute: 0 10*3/uL (ref 0.0–0.1)
Basophils Relative: 0 %
Eosinophils Absolute: 0.4 10*3/uL (ref 0.0–0.5)
Eosinophils Relative: 6 %
HCT: 30.9 % — ABNORMAL LOW (ref 36.0–46.0)
Hemoglobin: 9.9 g/dL — ABNORMAL LOW (ref 12.0–15.0)
Immature Granulocytes: 1 %
Lymphocytes Relative: 30 %
Lymphs Abs: 2.2 10*3/uL (ref 0.7–4.0)
MCH: 30.7 pg (ref 26.0–34.0)
MCHC: 32 g/dL (ref 30.0–36.0)
MCV: 96 fL (ref 80.0–100.0)
Monocytes Absolute: 0.5 10*3/uL (ref 0.1–1.0)
Monocytes Relative: 7 %
Neutro Abs: 4.1 10*3/uL (ref 1.7–7.7)
Neutrophils Relative %: 56 %
Platelets: 165 10*3/uL (ref 150–400)
RBC: 3.22 MIL/uL — ABNORMAL LOW (ref 3.87–5.11)
RDW: 14.1 % (ref 11.5–15.5)
WBC: 7.2 10*3/uL (ref 4.0–10.5)
nRBC: 0 % (ref 0.0–0.2)

## 2019-08-24 LAB — MAGNESIUM: Magnesium: 1.8 mg/dL (ref 1.7–2.4)

## 2019-08-24 NOTE — Discharge Summary (Signed)
[facility]  Physician Discharge Summary   Patient ID: Connie Haynes MRN: [id number] DOB/AGE: February 19, 1955 64 y.o.  Admit date: 08/10/2019 Discharge date: 08/24/2019  PCP: [name], MD  DISCHARGE DIAGNOSES:  Transverse myelitis Adrenal insufficiency Chronic kidney disease stage III Normocytic anemia Essential hypertension Morbid obesity History of anxiety disorder History of stroke History of obstructive sleep apnea   RECOMMENDATIONS FOR OUTPATIENT FOLLOW UP: 1. Follow-up with neurology in 2 to 3 weeks.  Referral sent to [facility], Dr. Felecia Shelling.    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  Presented to the emergency department with 1 week history of bilateral lower extremity numbness and weakness.  Concern was for transverse myelitis.  Patient was seen by neurology.  Started on IV steroids and plasma exchange.  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 -ACElevels,ANA, RF,SSA, SSB,B12, DsDNA, neuromyelitis optica autoantibodyIgGand anti-Jo 1 antibody were ordered.  Pending.  Can be followed up at the time of her follow-up with neurology.   -Patient was given IV Solu-Medrol for 5 days.   -7/2 MRI Brain shows right sided inferior frontal lobe/medial temporal lobe increased T2 and FLAIR signal and a 9 mm focus of contrast enhancement suggestive of subacute infarction versus demyelinating disease versus mass. But based on the spinal cord abnormalities shown yesterday, demyelinating disease is  favored. -Also started on plasma exchange.    She has completed 5 treatments.  Dialysis catheter to be removed today.  Okay for discharge.    Essential hypertension Continue home medications.    Normocytic anemia Hemoglobin stable.  Likely due to chronic disease.  No evidence of overt blood loss.  Adrenal insufficiency Patient usually takes hydrocortisone at home.  This was held while she was getting high-dose steroids here in the hospital. Can resume at discharge.  Morbid obesity Estimated body mass index is 46.44 kg/m as calculated from the following:   Height as of this encounter: 5\' 5"  (1.651 m).   Weight as of this encounter: 126.6 kg.  Anxiety and tremors Continue home medications including Celexa.  H/o CVA No residual deficits.  Not noted to be on any antiplatelet agent.  OSA CPAP  Restless leg syndrome -Pramipexole 1.5 mg daily   CKD stage II -Follows up with nephrology as outpatient  -Stable  Hypokalemia Replace potassium  Hypophosphatemia --Resolved  Overall stable.  Okay for discharge home today.   PERTINENT LABS:  The results of significant diagnostics from this hospitalization (including imaging, microbiology, ancillary and laboratory) are listed below for reference.      Labs:    Basic Metabolic Panel: Recent Labs  Lab 08/19/19 0356 08/19/19 1008 08/20/19 0206 08/20/19 0206 08/21/19 0308 08/21/19 0800 08/22/19 0358 08/23/19 0416 08/24/19 0417  NA 137   < > 136   < > 136 136 138 138 137  K 4.7   < > 4.3   < > 3.3* 3.1* 3.8 3.4* 3.7  CL 108   < > 110   < > 106 106 109 108 110  CO2 22   < > 19*   < > 22 21* 21* 22 22  GLUCOSE 178*   < >  111*   < > 109* 113* 102* 105* 110*  BUN 16   < > 20   < > 14 14 11 10  7*  CREATININE 0.87   < > 0.93   < > 1.01* 0.96 0.91 0.84 0.97  CALCIUM 8.1*   < > 8.2*   < > 8.2* 8.3* 8.4* 8.5* 8.0*  MG 2.0  --  1.9  --  1.8  --  1.8 2.0 1.8  PHOS 2.6  --  2.1*  --  3.1  --  2.8 2.9  --    < >  = values in this interval not displayed.   Liver Function Tests: Recent Labs  Lab 08/19/19 0356 08/20/19 0206 08/21/19 0308 08/22/19 0358 08/23/19 0416  AST 11* 14* 13* 11* 14*  ALT 12 8 11 9 12   ALKPHOS 21* 13* 21* 18* 20*  BILITOT 0.8 0.6 1.1 0.6 0.6  PROT 4.8* 4.4* 4.7* 4.4* 4.7*  ALBUMIN 3.9 3.7 3.7 3.7 3.6   CBC: Recent Labs  Lab 08/20/19 0206 08/21/19 0308 08/22/19 0358 08/23/19 0416 08/24/19 0417  WBC 14.4* 10.2 8.8 7.5 7.2  NEUTROABS 9.5* 5.6 4.6 4.4 4.1  HGB 11.8* 10.9* 10.6* 10.2* 9.9*  HCT 35.6* 33.6* 32.6* 31.9* 30.9*  MCV 93.2 93.3 94.5 94.1 96.0  PLT 224 198 188 172 165     IMAGING STUDIES CT CHEST WO CONTRAST  Result Date: 08/15/2019 CLINICAL DATA:  Sarcoidosis, CNS disease, question adenopathy, history hypertension, will pink cough, sleep apnea, stage III chronic kidney disease, hypertension, GERD EXAM: CT CHEST WITHOUT CONTRAST TECHNIQUE: Multidetector CT imaging of the chest was performed following the standard protocol without IV contrast. Sagittal and coronal MPR images reconstructed from axial data set. COMPARISON:  07/16/2011 FINDINGS: Cardiovascular: Aorta normal caliber. Heart unremarkable. No pericardial effusion. Mediastinum/Nodes: Esophagus normal appearance. Base of cervical region normal appearance. No enlarged mediastinal or axillary lymph nodes identified. Suboptimal assessment of hila for adenopathy due to lack of IV contrast, no gross mass seen. Lungs/Pleura: New 5 x 10 mm RIGHT middle lobe nodule image 69. Minimal subsegmental atelectasis in lower lobes. Lungs otherwise clear. No infiltrate, pleural effusion or pneumothorax. Upper Abdomen: Visualized upper abdomen unremarkable Musculoskeletal: BILATERAL shoulder prostheses. Prior cervical spine fusion. No acute osseous findings. IMPRESSION: No definite thoracic adenopathy. 5 x 10 mm RIGHT middle lobe nodule image 69; consider one of the following in 3 months for both low-risk and high-risk  individuals: (a) repeat chest CT, (b) follow-up PET-CT, or (c) tissue sampling. This recommendation follows the consensus statement: Guidelines for Management of Incidental Pulmonary Nodules Detected on CT Images: From the Fleischner Society 2017; Radiology 2017; 284:228-243. Electronically Signed   By: Lavonia Dana M.D.   On: 08/15/2019 16:29   MR BRAIN W WO CONTRAST  Result Date: 08/11/2019 CLINICAL DATA:  Difficulty with urination beginning about 1 week ago. Abnormal sensation of the lower extremities. EXAM: MRI HEAD WITHOUT AND WITH CONTRAST TECHNIQUE: Multiplanar, multiecho pulse sequences of the brain and surrounding structures were obtained without and with intravenous contrast. CONTRAST:  82mL GADAVIST GADOBUTROL 1 MMOL/ML IV SOLN COMPARISON:  Head CT 12/23/2015.  Spinal MRI studies done yesterday. FINDINGS: Brain: Diffusion imaging does not show any acute or subacute infarction or other cause of restricted diffusion. Brainstem and cerebellum are normal. Cerebral hemispheres show a 7 mm focus of T2 and FLAIR signal within the basal ganglia and radiating white matter tracts on the left. Additionally, there is abnormal T2 and FLAIR signal at the base of  the brain on the right affecting the inferior frontal and inferior medial temporal lobe. There is a 9 mm focus of contrast enhancement in that region. No restricted diffusion. No mass lesion, hemorrhage, hydrocephalus or extra-axial collection. Vascular: Major vessels at the base of the brain show flow. Skull and upper cervical spine: Negative Sinuses/Orbits: Clear/normal Other: None IMPRESSION: Abnormality at the inferior frontal lobe/medial temporal lobe on the right with a region of increased T2 and FLAIR signal and a 9 mm focus of contrast enhancement. Differential diagnosis is late subacute infarction versus demyelinating disease versus mass. Based on the spinal cord abnormalities shown yesterday, demyelinating disease is favored. A focus of cerebritis  seems least likely. Neuro sarcoid is also being considered in the clinical differential diagnosis of the spinal disease, which could possibly be consistent with this brain lesion. 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 Electronically Signed   By: Nelson Chimes M.D.   On: 08/11/2019 12:11   MR THORACIC SPINE WO CONTRAST  Result Date: 08/10/2019 CLINICAL DATA:  Low back pain, lower extremity numbness, urinary problems EXAM: MRI THORACIC AND LUMBAR SPINE WITHOUT CONTRAST TECHNIQUE: Multiplanar and multiecho pulse sequences of the thoracic and lumbar spine were obtained without intravenous contrast. COMPARISON:  None. FINDINGS: MRI THORACIC SPINE Motion artifact is present Alignment:  Anteroposterior alignment is maintained. Vertebrae: Vertebral body heights are preserved. There is a probable vertebral body hemangioma at T4. There is no significant marrow edema. No suspicious osseous lesion. Cord: There is abnormal cord signal extending superiorly is from the T3-T4 level. Paraspinal and other soft tissues: Unremarkable. Disc levels: Mild degenerative changes are present. For example, there are minor disc bulges at T2-T3 and T11-T12. There is mild to moderate facet arthropathy. No high-grade canal or neural foraminal narrowing identified. MRI LUMBAR SPINE Motion artifact is present. Segmentation: Counting from above, there are 6 lumbar type vertebral bodies. Alignment: There is grade 1 anterolisthesis at L3-L4. Mild retrolisthesis is present at L4-L5. Vertebrae: Vertebral body heights are maintained apart from degenerative endplate irregularity primarily at L3-L4. Probable small hemangioma at L1. There is no significant marrow edema or suspicious osseous lesion. Conus medullaris and cauda equina: Conus extends to the L1-L2 level. Conus and cauda equina appear normal. Paraspinal and other soft tissues: Unremarkable. Disc levels: L1-L2:  No canal or foraminal  stenosis. L2-L3:  Facet arthropathy.  No canal or foraminal stenosis L3-L4: Anterolisthesis with uncovering of disc bulge. Superimposed right foraminal protrusion. Facet arthropathy. No canal stenosis. Moderate right foraminal stenosis. No left foraminal stenosis. L4-L5: Disc bulge. Facet arthropathy. No canal or left foraminal stenosis. Mild right foraminal stenosis. L5-L6: Disc bulge eccentric to the left. Facet arthropathy. No canal or right foraminal stenosis. Mild to moderate left foraminal stenosis. L6-S1: No canal or foraminal stenosis. IMPRESSION: Motion artifact is present. Abnormal cord signal extending superiorly from the T3-T4 level into the cervical spine. Recommend contrast enhanced MRI of the cervical spine. Multilevel degenerative changes as detailed above. No high-grade canal stenosis. Right foraminal protrusion at L3-L4 causes moderate stenosis. These results were called by telephone at the time of interpretation on 08/10/2019 at 7:03 pm Electronically Signed   By: Macy Mis M.D.   On: 08/10/2019 19:07   MR LUMBAR SPINE WO CONTRAST  Result Date: 08/10/2019 CLINICAL DATA:  Low back pain, lower extremity numbness, urinary problems EXAM: MRI THORACIC AND LUMBAR SPINE WITHOUT CONTRAST TECHNIQUE: Multiplanar and multiecho pulse sequences of the thoracic and lumbar spine were obtained without intravenous  contrast. COMPARISON:  None. FINDINGS: MRI THORACIC SPINE Motion artifact is present Alignment:  Anteroposterior alignment is maintained. Vertebrae: Vertebral body heights are preserved. There is a probable vertebral body hemangioma at T4. There is no significant marrow edema. No suspicious osseous lesion. Cord: There is abnormal cord signal extending superiorly is from the T3-T4 level. Paraspinal and other soft tissues: Unremarkable. Disc levels: Mild degenerative changes are present. For example, there are minor disc bulges at T2-T3 and T11-T12. There is mild to moderate facet arthropathy. No  high-grade canal or neural foraminal narrowing identified. MRI LUMBAR SPINE Motion artifact is present. Segmentation: Counting from above, there are 6 lumbar type vertebral bodies. Alignment: There is grade 1 anterolisthesis at L3-L4. Mild retrolisthesis is present at L4-L5. Vertebrae: Vertebral body heights are maintained apart from degenerative endplate irregularity primarily at L3-L4. Probable small hemangioma at L1. There is no significant marrow edema or suspicious osseous lesion. Conus medullaris and cauda equina: Conus extends to the L1-L2 level. Conus and cauda equina appear normal. Paraspinal and other soft tissues: Unremarkable. Disc levels: L1-L2:  No canal or foraminal stenosis. L2-L3:  Facet arthropathy.  No canal or foraminal stenosis L3-L4: Anterolisthesis with uncovering of disc bulge. Superimposed right foraminal protrusion. Facet arthropathy. No canal stenosis. Moderate right foraminal stenosis. No left foraminal stenosis. L4-L5: Disc bulge. Facet arthropathy. No canal or left foraminal stenosis. Mild right foraminal stenosis. L5-L6: Disc bulge eccentric to the left. Facet arthropathy. No canal or right foraminal stenosis. Mild to moderate left foraminal stenosis. L6-S1: No canal or foraminal stenosis. IMPRESSION: Motion artifact is present. Abnormal cord signal extending superiorly from the T3-T4 level into the cervical spine. Recommend contrast enhanced MRI of the cervical spine. Multilevel degenerative changes as detailed above. No high-grade canal stenosis. Right foraminal protrusion at L3-L4 causes moderate stenosis. These results were called by telephone at the time of interpretation on 08/10/2019 at 7:03 pm Electronically Signed   By: Macy Mis M.D.   On: 08/10/2019 19:07   MR Cervical Spine W or Wo Contrast  Result Date: 08/10/2019 CLINICAL DATA:  Initial evaluation for abnormal cord signal intensity seen on prior MRI of the thoracic spine. EXAM: MRI CERVICAL SPINE WITHOUT AND WITH  CONTRAST TECHNIQUE: Multiplanar and multiecho pulse sequences of the cervical spine, to include the craniocervical junction and cervicothoracic junction, were obtained without and with intravenous contrast. CONTRAST:  22mL GADAVIST GADOBUTROL 1 MMOL/ML IV SOLN COMPARISON:  Prior thoracic spine MRI from earlier the same day. FINDINGS: Alignment: Examination moderately degraded by motion artifact. Straightening of the normal cervical lordosis. 2 mm anterolisthesis of C3 on C4, with trace anterolisthesis of C4 on C5 and T1 on T2, chronic and degenerative. Vertebrae: Susceptibility artifact from prior ACDF seen at C5-C7 with solid arthrodesis. Vertebral body height maintained without evidence for acute or chronic fracture. Bone marrow signal intensity within normal limits. Benign hemangioma noted within the T1 vertebral body. No worrisome osseous lesions. No abnormal marrow edema or enhancement. Cord: Abnormal diffuse T2/STIR signal intensity seen throughout the mid and lower cervical spinal cord, extending from the level of C4 through T3-4 as seen on corresponding thoracic spine MRI. Signal intensity is fairly diffuse and primarily central in location, with associated abnormal cord expansion, suggesting edema. Irregular eccentric patchy post-contrast enhancement seen throughout the areas affected (series 12, images 8, 9 on sagittal sequence, series 13, images 35, 27, 23 on axial sequence). Finding most suggestive of acute transverse myelitis. Superiorly, the spinal cord is relatively normal in appearance to the cervicomedullary  junction. Posterior Fossa, vertebral arteries, paraspinal tissues: Visualized brain and posterior fossa are grossly within normal limits on this motion degraded exam. Craniocervical junction normal. Paraspinous and prevertebral soft tissues within normal limits. Normal flow voids seen within the vertebral arteries bilaterally. Disc levels: C2-C3: Minimal disc bulge. Left-sided facet hypertrophy.  No significant canal or foraminal stenosis. C3-C4: Anterolisthesis. Mild disc bulge with uncovertebral hypertrophy. Superimposed bilateral facet degeneration. No significant spinal stenosis. Moderate right worse than left C4 foraminal stenosis. C4-C5: Anterolisthesis. Mild disc bulge with uncovertebral hypertrophy. Bilateral facet degeneration. No significant spinal stenosis. Moderate bilateral C5 foraminal stenosis. C5-C6: Prior fusion. No residual spinal stenosis. Foramina are grossly patent. C6-C7: Prior fusion. No significant residual spinal stenosis. Foramina are grossly patent. C7-T1: Minimal disc bulge. Right greater than left facet hypertrophy. No significant canal or foraminal stenosis. IMPRESSION: 1. Abnormal T2/STIR signal intensity throughout the mid and lower cervical spinal cord, extending from C4 through T3-4, with associated patchy post-contrast enhancement. 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 2. Prior ACDF at C5-C7 without residual spinal stenosis. 3. Mild degenerative spondylosis and facet degeneration at C3-4 and C4-5 with resultant moderate bilateral C4 and C5 foraminal stenosis. Electronically Signed   By: Jeannine Boga M.D.   On: 08/10/2019 20:29   DG CHEST PORT 1 VIEW  Result Date: 08/16/2019 CLINICAL DATA:  Central line placement. EXAM: PORTABLE CHEST 1 VIEW COMPARISON:  Chest CT earlier today. FINDINGS: Tip of the right internal jugular central venous catheter projects over the mid SVC. No pneumothorax. Upper normal heart size with normal mediastinal contours. No focal airspace disease, pulmonary edema, or pleural effusion. The right middle lobe nodule on CT is not well demonstrated by radiograph. Postsurgical change in the lower cervical spine and both shoulders. Bone island in the posterior left fifth rib. IMPRESSION: 1. Tip of the right internal jugular central venous catheter projects over  the mid SVC. No pneumothorax. 2. Right middle lobe pulmonary nodule on CT is not well-defined by radiograph. Electronically Signed   By: Keith Rake M.D.   On: 08/16/2019 15:30   VAS [place] LOWER EXTREMITY VENOUS (DVT)  Result Date: 08/16/2019  Lower Venous DVTStudy Indications: Pain, and Edema.  Limitations: Pain tolerance. Comparison Study: no prior Performing Technologist: Abram Sander RVS  Examination Guidelines: A complete evaluation includes B-mode imaging, spectral Doppler, color Doppler, and power Doppler as needed of all accessible portions of each vessel. Bilateral testing is considered an integral part of a complete examination. Limited examinations for reoccurring indications may be performed as noted. The reflux portion of the exam is performed with the patient in reverse Trendelenburg.  +---------+---------------+---------+-----------+----------+-------------------+ RIGHT    CompressibilityPhasicitySpontaneityPropertiesThrombus Aging      +---------+---------------+---------+-----------+----------+-------------------+ CFV      Full           Yes      Yes                                      +---------+---------------+---------+-----------+----------+-------------------+ SFJ      Full                                                             +---------+---------------+---------+-----------+----------+-------------------+  FV Prox  Full                                                             +---------+---------------+---------+-----------+----------+-------------------+ FV Mid                  Yes      Yes                  unable to compress                                                        due to pain                                                               tolerance           +---------+---------------+---------+-----------+----------+-------------------+ FV Distal               Yes      Yes                  unable to compress                                                         due to pain                                                               tolerance           +---------+---------------+---------+-----------+----------+-------------------+ PFV      Full                                                             +---------+---------------+---------+-----------+----------+-------------------+ POP      Full           Yes      Yes                                      +---------+---------------+---------+-----------+----------+-------------------+ PTV      Full                                                             +---------+---------------+---------+-----------+----------+-------------------+  PERO     Full                                                             +---------+---------------+---------+-----------+----------+-------------------+   +---------+---------------+---------+-----------+----------+-------------------+ LEFT     CompressibilityPhasicitySpontaneityPropertiesThrombus Aging      +---------+---------------+---------+-----------+----------+-------------------+ CFV      Full           Yes      Yes                                      +---------+---------------+---------+-----------+----------+-------------------+ SFJ      Full                                                             +---------+---------------+---------+-----------+----------+-------------------+ FV Prox  Full           Yes      Yes                  unable to compress                                                        due to pain                                                               tolerance           +---------+---------------+---------+-----------+----------+-------------------+ FV Mid                  Yes      Yes                  unable to compress                                                        due to pain                                                                tolerance           +---------+---------------+---------+-----------+----------+-------------------+ FV DistalFull                                                             +---------+---------------+---------+-----------+----------+-------------------+  PFV      Full                                                             +---------+---------------+---------+-----------+----------+-------------------+ POP      Full           Yes      Yes                                      +---------+---------------+---------+-----------+----------+-------------------+ PTV      Full                                                             +---------+---------------+---------+-----------+----------+-------------------+ PERO     Full                                                             +---------+---------------+---------+-----------+----------+-------------------+     Summary: BILATERAL: - No evidence of deep vein thrombosis seen in the lower extremities, bilaterally. -No evidence of popliteal cyst, bilaterally.   *See table(s) above for measurements and observations. Electronically signed by Harold Barban MD on 08/16/2019 at 5:11:28 PM.    Final    DG Lumbar Puncture Fluoro Guide  Result Date: 08/11/2019 CLINICAL DATA:  Transverse myelitis EXAM: DIAGNOSTIC LUMBAR PUNCTURE UNDER FLUOROSCOPIC GUIDANCE FLUOROSCOPY TIME:  Fluoroscopy Time:  30 seconds Radiation Exposure Index (if provided by the fluoroscopic device): Number of Acquired Spot Images: 0 PROCEDURE: Informed consent was obtained from the patient prior to the procedure, including potential complications of headache, allergy, and pain. With the patient prone, the lower back was prepped with Betadine. 1% Lidocaine was used for local anesthesia. Lumbar puncture was performed at the L4-5 level using a 20 gauge needle with return of clear CSF with an opening pressure of 20  cm water. Ten ml of CSF were obtained for laboratory studies. The patient tolerated the procedure well and there were no apparent complications. IMPRESSION: Successful lumbar puncture under fluoroscopic guidance. Electronically Signed   By: Rolm Baptise M.D.   On: 08/11/2019 00:06    DISCHARGE EXAMINATION: Vitals:   08/23/19 1638 08/23/19 1816 08/23/19 2321 08/24/19 0548  BP: (!) 144/68 129/64 (!) 102/48 (!) 126/51  Pulse: 73 77 72 68  Resp: 14 16 17 18   Temp: 98.5 F (36.9 C) 97.8 F (36.6 C) 98.3 F (36.8 C) 97.8 F (36.6 C)  TempSrc: Oral Oral Oral Oral  SpO2: 98% 97% 96% 95%  Weight:    127 kg  Height:       General appearance: Awake alert.  In no distress Resp: Clear to auscultation bilaterally.  Normal effort Cardio: S1-S2 is normal regular.  No S3-S4.  No rubs murmurs or bruit GI: Abdomen is soft.  Nontender nondistended.  Bowel sounds are present normal.  No masses organomegaly  DISPOSITION: Home  Discharge Instructions    Ambulatory referral to Neurology   Complete by: As directed    An appointment is requested in approximately: 2 weeks.  Follow-up for transverse myelitis treated in the hospital.   Call MD for:  difficulty breathing, headache or visual disturbances   Complete by: As directed    Call MD for:  extreme fatigue   Complete by: As directed    Call MD for:  persistant dizziness or light-headedness   Complete by: As directed    Call MD for:  persistant nausea and vomiting   Complete by: As directed    Call MD for:  severe uncontrolled pain   Complete by: As directed    Call MD for:  temperature >100.4   Complete by: As directed    Diet - low sodium heart healthy   Complete by: As directed    Discharge instructions   Complete by: As directed    Please be sure to follow-up with neurology.  Continue with outpatient physical therapy.  Seek attention if your symptoms recur or you develop new symptoms.  You were cared for by a hospitalist during your  hospital stay. If you have any questions about your discharge medications or the care you received while you were in the hospital after you are discharged, you can call the unit and asked to speak with the hospitalist on call if the hospitalist that took care of you is not available. Once you are discharged, your primary care physician will handle any further medical issues. Please note that NO REFILLS for any discharge medications will be authorized once you are discharged, as it is imperative that you return to your primary care physician (or establish a relationship with a primary care physician if you do not have one) for your aftercare needs so that they can reassess your need for medications and monitor your lab values. If you do not have a primary care physician, you can call [phone] for a physician referral.   Increase activity slowly   Complete by: As directed         Allergies as of 08/24/2019      Reactions   Codeine Nausea And Vomiting   Can take if takes phernergan   Hydrocodone Nausea Only   Robaxin [methocarbamol] Rash      Medication List    STOP taking these medications   multivitamin tablet   sulfamethoxazole-trimethoprim 800-160 MG tablet Commonly known as: BACTRIM DS     TAKE these medications   acetaminophen 500 MG tablet Commonly known as: TYLENOL Take 2 tablets (1,000 mg total) by mouth every 8 (eight) hours. What changed:   when to take this  reasons to take this   Amitiza 24 MCG capsule Generic drug: lubiprostone Take 24 mcg by mouth 2 (two) times daily.   carvedilol 12.5 MG tablet Commonly known as: COREG Take 12.5 mg by mouth 2 (two) times daily.   citalopram 20 MG tablet Commonly known as: CELEXA Take 20 mg by mouth at bedtime.   furosemide 20 MG tablet Commonly known as: LASIX Take two tablets (40 mg) daily as needed for swelling. What changed:   how much to take  how to take this  when to take this  reasons to take  this  additional instructions   hydrocortisone 10 MG tablet Commonly known as: CORTEF Take 5-10 mg by mouth See admin instructions. Take 10mg  in the AM and 5mg  in the afternoon.   multivitamin with  minerals Tabs tablet Take 1 tablet by mouth daily.   oxyCODONE 5 MG immediate release tablet Commonly known as: Oxy IR/ROXICODONE Take 1-3 tablets (5-15 mg total) by mouth every 4 (four) hours as needed for severe pain.   pramipexole 1 MG tablet Commonly known as: MIRAPEX Take 1.5 mg by mouth at bedtime.   promethazine 25 MG tablet Commonly known as: PHENERGAN Take 1 tablet (25 mg total) by mouth every 6 (six) hours as needed for nausea or vomiting. What changed: when to take this   telmisartan-hydrochlorothiazide 40-12.5 MG tablet Commonly known as: MICARDIS HCT Take 1 tablet by mouth every morning.   tiZANidine 4 MG tablet Commonly known as: ZANAFLEX Take 1.5 tablets (6 mg total) by mouth every 8 (eight) hours as needed for muscle spasms.         Follow-up Information    [facility] Follow up.   Why: August 29, 2019 at 10 am  Contact information: OP PT at : [facility] at [address], [place], [place] [address] Phone [phone]        Sater, Nanine Means, MD Follow up in 3 week(s).   Specialty: Neurology Why: Referral will be sent Contact information: [place]  [phone]               TOTAL DISCHARGE TIME: 35 minutes  [facility] Pager on [email or web address]  08/24/2019, 1:47 PM

## 2019-08-24 NOTE — Care Management (Signed)
Patient wanting appointment scheduled for OP PT at : [facility] at [address], [place], [place] [address] Phone [phone] she sees [facility].  Called spoke to Sharon, she has 12 appointments scheduled the next appointment is for August 29, 2019 Tuesday at 82 am. Toula Moos does not need a new order or clinicals.   Magdalen Spatz RN

## 2019-08-24 NOTE — Progress Notes (Signed)
IV team order placed for cath removal so pot can discharge

## 2019-08-24 NOTE — Progress Notes (Signed)
RIJ [place] removed per order. SIte cleaned with CHG, covered with vaseline guaze and dry 4x4, covered with tegaderm.  Site without signs of infection and bleeding.  Pressure applied to site x 30 minutes.  Pt verbalizes understanding of site care - bleeding interventions, signs of infection and to change drsg in 24-48 hours via teachback method.

## 2019-08-24 NOTE — Progress Notes (Signed)
Physical Therapy Treatment Patient Details Name: Connie Haynes MRN: [id number] DOB: 1955/12/06 Today's Date: 08/24/2019    History of Present Illness Connie Haynes is a 64 y.o. 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  Undergoing further work up for transverse myelitis versus other deficiencies.    PT Comments    Pt making excellent progress overall in regards to her functional mobility. She was in very good spirits today as she is scheduled to d/c home today. Pt preferring OP PT versus HHPT for f/u PT services. Pt would continue to benefit from skilled physical therapy services at this time while admitted and after d/c to address the below listed limitations in order to improve overall safety and independence with functional mobility.    Follow Up Recommendations  Outpatient PT;Other (comment) (per CIR AC note, pt does not qualify for CIR)     Equipment Recommendations  None recommended by PT    Recommendations for Other Services       Precautions / Restrictions Precautions Precautions: Fall Precaution Comments: decreased LE sensation (L>R) Restrictions Weight Bearing Restrictions: No    Mobility  Bed Mobility Overal bed mobility: Modified Independent                Transfers Overall transfer level: Modified independent Equipment used: Rolling walker (2 wheeled)                Ambulation/Gait Ambulation/Gait assistance: Min guard Gait Distance (Feet): 100 Feet Assistive device: Rolling walker (2 wheeled) Gait Pattern/deviations: Step-through pattern;Decreased stride length Gait velocity: decreased   General Gait Details: pt demonstrating  greatly improved stability today with ambulation using RW in hallway; no LOB or difficulties advancing either LE forwards   Stairs             Wheelchair Mobility    Modified Rankin (Stroke Patients Only)       Balance Overall balance assessment: Needs assistance Sitting-balance support: Feet supported Sitting balance-Leahy Scale: Normal Sitting balance - Comments: pt donning socks at EOB independently   Standing balance support: During functional activity Standing balance-Leahy Scale: Fair                              Cognition Arousal/Alertness: Awake/alert Behavior During Therapy: WFL for tasks assessed/performed Overall Cognitive Status: Within Functional Limits for tasks assessed                                        Exercises      General Comments        Pertinent Vitals/Pain Pain Assessment: No/denies pain    Home Living                      Prior Function            PT Goals (current goals can now be found in the care plan section) Acute Rehab PT Goals PT Goal Formulation: With patient Time For Goal Achievement: 08/25/19 Potential to Achieve Goals: Good Progress towards PT goals: Progressing toward goals    Frequency  Min 3X/week      PT Plan Current plan remains appropriate    Co-evaluation              AM-PAC PT "6 Clicks" Mobility   Outcome Measure  Help needed turning from your back to your side while in a flat bed without using bedrails?: None Help needed moving from lying on your back to sitting on the side of a flat bed without using bedrails?: None Help needed moving to and from a bed to a chair (including a wheelchair)?: None Help needed standing up from a chair using your arms (e.g., wheelchair or bedside chair)?: None Help needed to walk in hospital room?: None Help needed climbing 3-5 steps with a railing? : A Lot 6 Click Score: 22    End of Session Equipment Utilized  During Treatment: Gait belt Activity Tolerance: Patient tolerated treatment well Patient left: in bed;with call bell/phone within reach Nurse Communication: Mobility status PT Visit Diagnosis: Other abnormalities of gait and mobility (R26.89);Difficulty in walking, not elsewhere classified (R26.2);Other symptoms and signs involving the nervous system (E42.353)     Time: 6144-3154 PT Time Calculation (min) (ACUTE ONLY): 11 min  Charges:  $Gait Training: 8-22 mins                     Anastasio Champion, DPT  Acute Rehabilitation Services Pager [phone] Office [place] 08/24/2019, 11:57 AM

## 2019-08-28 ENCOUNTER — Telehealth: Payer: Self-pay | Admitting: Neurology

## 2019-08-28 NOTE — Telephone Encounter (Signed)
Called pt back and scheduled appt for tomorrow at 1:30pm with Dr. Felecia Shelling. Hospital referral from Bonnielee Haff, MD for transverse myelitis. Advised pt to check in byb 1:00pm, bring insurance cards, med list and to wear a mask. EJ,RN 08/28/19

## 2019-08-28 NOTE — Telephone Encounter (Signed)
Spoke with patient. She was recently discharged from the hospital and was told to follow up with Dr. Felecia Shelling asap due to the increased numbness and tingling in her legs and feet. I reviewed Dr. Garth Bigness schedule and he does not have any available. She only wants to see Dr. Felecia Shelling.

## 2019-08-29 ENCOUNTER — Other Ambulatory Visit: Payer: Self-pay

## 2019-08-29 ENCOUNTER — Telehealth: Payer: Self-pay | Admitting: *Deleted

## 2019-08-29 ENCOUNTER — Encounter: Payer: Self-pay | Admitting: Neurology

## 2019-08-29 ENCOUNTER — Ambulatory Visit (INDEPENDENT_AMBULATORY_CARE_PROVIDER_SITE_OTHER): Payer: Medicare Other | Admitting: Neurology

## 2019-08-29 VITALS — BP 142/77 | HR 69 | Ht 65.0 in | Wt 278.6 lb

## 2019-08-29 DIAGNOSIS — R269 Unspecified abnormalities of gait and mobility: Secondary | ICD-10-CM | POA: Diagnosis not present

## 2019-08-29 DIAGNOSIS — Z79899 Other long term (current) drug therapy: Secondary | ICD-10-CM

## 2019-08-29 DIAGNOSIS — R200 Anesthesia of skin: Secondary | ICD-10-CM | POA: Diagnosis not present

## 2019-08-29 DIAGNOSIS — R9089 Other abnormal findings on diagnostic imaging of central nervous system: Secondary | ICD-10-CM

## 2019-08-29 DIAGNOSIS — G369 Acute disseminated demyelination, unspecified: Secondary | ICD-10-CM

## 2019-08-29 NOTE — Progress Notes (Signed)
[facility]  PATIENT: Connie Haynes DOB: 02-23-1955  REFERRING DOCTOR OR PCP: Elza Rafter, MD SOURCE: Patient, notes from recent hospitalization, imaging and laboratory reports, MRI images personally reviewed.  _________________________________   HISTORICAL  CHIEF COMPLAINT:  Chief Complaint  Patient presents with  . New Patient (Initial Visit)    RM 13, alone. Hospital referral from [facility] for trasverse myelitis (was there 08/10/19-08/24/19). Given 5 days IV steroids but this was ineffective and they ended up giving her Plasma. MRI's/LP done in the hospital. Neuromyelitis antibody positive. She denies any problems with vision.  Marland Kitchen RLS    Takes Pramipexole 1.5 mg daily. Since being out of the hospital she has not had issues with RLS. Dx with this about 4-5 years ago.   . Sleep Apnea    Unable to use CPAP machine. Was put on oxygen at night in the hospital which helped. Wanting to try and get this to have at home.   . Gait Problem    Was not using a walker prior to going to the hospital. She can now walk but has no feeling still.    HISTORY OF PRESENT ILLNESS:  I had the pleasure of seeing your patient, Connie Haynes, at [facility] for neurologic consultation regarding her transverse myelitis and abnormal brain MRI.  In late June 2021, she had the onset of urinary retention and the next morning had numbness from the waist down.   She initially presented to the ED but left due to the long wait.   She re-presented to [facility] emergency room when she had worsening with more intense numbness, weakness and reduced gait.  MRIs showed a longitudinal enhancing lesion in the lower cervical and upper thoracic spine (C4-T4) and the brain showed 2 foci, one that enhanced near the basal ganglia on the right.     She was admitted for further evaluation and treatment.  She had high dose steroids for 5 days but did not note a benefit.  She then had 5  treatments of plasma exchange..    Strength improved 08/22/2019 and she was able to take a few steps.   She was doing some physical therapy while in the hospital.  By the time of discharge, she was able to use a walker.  She has improved a little more since then.      Currently, she is numb below the waist including the groin.   Due to the numbness, she does not know when she needs to use the bathroom so is going on a scheduled basis.   This is more an issue with defecation than with urination where she gets a mild pressure sensation.   Gait has improved further.  She is walking > 100 feet with a walker without needing to take a break.  She feels strength is significantly better in both legs though numbness, unfortunately, has not improved as much.  She denies any new difficulty with her vision.  She notes some fatigue.  No difficulty with cognition.  While in the hospital she had a LP (CSF was normal except slightly elevated protein); NMO Ab (negative), anti-MOG (negative).  Additionally she had ANCA, HIV, TB, B12, rheumatoid factor, anti-Jo 1, ENA, HSV, ACE.  These were all negative or normal.  She has adrenal insufficiency and is on Cortef.  She also has stage 2 CKD, HTN, WPW (S/p ablation).  I personally reviewed the MRIs of the lumbar spine, thoracic spine, cervical spine  and brain performed 08/10/2019 and 08/11/2019.  Imaging review: MRI brain 08/11/2019: There is a 9 mm enhancing focus in the inferior right frontal lobe associated with some mass-effect.  Additionally, there is a small left T2/flair hyperintense focus just above the left internal capsule.  The etiology of the focus lesion is uncertain.  It could represent a focus of demyelination.  Neoplasm cannot be completely ruled out.  The more chronic appearing focus could be demyelinating or ischemic  MRI of the cervical spine and thoracic spine 08/10/2019: Central to posterior enhancing focus within the spinal cord from C4-T4.  This causes some  enlargement of the cord.  Additionally there is ACDF at C5-C7 and DJD at C3-C4 and C4-C5.  MRI of the lumbar spine 08/10/2019: There is anterolisthesis of L3 upon L4 associated with facet hypertrophy and protrusion of the uncovered disc.  There is foraminal narrowing, more on the right.  Milder degenerative changes are noted at some of the lumbar levels but no spinal stenosis or significant nerve root compression.  REVIEW OF SYSTEMS: Constitutional: No fevers, chills, sweats, or change in appetite Eyes: No visual changes, double vision, eye pain Ear, nose and throat: No hearing loss, ear pain, nasal congestion, sore throat Cardiovascular: No chest pain, palpitations Respiratory: No shortness of breath at rest or with exertion.   No wheezes GastrointestinaI: No nausea, vomiting, diarrhea, abdominal pain, fecal incontinence Genitourinary: No dysuria, urinary retention or frequency.  No nocturia. Musculoskeletal: No neck pain, back pain Integumentary: No rash, pruritus, skin lesions Neurological: as above Psychiatric: No depression at this time.  No anxiety Endocrine: No palpitations, diaphoresis, change in appetite, change in weigh or increased thirst Hematologic/Lymphatic: No anemia, purpura, petechiae. Allergic/Immunologic: No itchy/runny eyes, nasal congestion, recent allergic reactions, rashes  ALLERGIES: Allergies  Allergen Reactions  . Codeine Nausea And Vomiting    Can take if takes phernergan  . Hydrocodone Nausea Only  . Robaxin [Methocarbamol] Rash    HOME MEDICATIONS:  Current Outpatient Medications:  .  acetaminophen (TYLENOL) 500 MG tablet, Take 2 tablets (1,000 mg total) by mouth every 8 (eight) hours. (Patient taking differently: Take 1,000 mg by mouth every 8 (eight) hours as needed for mild pain. ), Disp: 30 tablet, Rfl: 0 .  AMITIZA 24 MCG capsule, Take 24 mcg by mouth 2 (two) times daily. , Disp: , Rfl:  .  carvedilol (COREG) 12.5 MG tablet, Take 12.5 mg by mouth  2 (two) times daily. , Disp: , Rfl:  .  citalopram (CELEXA) 20 MG tablet, Take 20 mg by mouth at bedtime., Disp: , Rfl:  .  hydrocortisone (CORTEF) 10 MG tablet, Take 5-10 mg by mouth See admin instructions. Take 10mg  in the AM and 5mg  in the afternoon., Disp: , Rfl:  .  pramipexole (MIRAPEX) 1 MG tablet, Take 1.5 mg by mouth at bedtime. , Disp: , Rfl:  .  promethazine (PHENERGAN) 25 MG tablet, Take 1 tablet (25 mg total) by mouth every 6 (six) hours as needed for nausea or vomiting. (Patient taking differently: Take 25 mg by mouth at bedtime. ), Disp: 60 tablet, Rfl: 0 .  telmisartan-hydrochlorothiazide (MICARDIS HCT) 40-12.5 MG tablet, Take 1 tablet by mouth every morning., Disp: , Rfl:  .  tiZANidine (ZANAFLEX) 4 MG tablet, Take 1.5 tablets (6 mg total) by mouth every 8 (eight) hours as needed for muscle spasms., Disp: 30 tablet, Rfl: 0 No current facility-administered medications for this visit.  Facility-Administered Medications Ordered in Other Visits:  .  chlorhexidine (HIBICLENS) 4 %  liquid 4 application, 60 mL, Topical, Once, Dixon, Robb Matar, PA-C .  technetium tetrofosmin (TC-MYOVIEW) injection 31.7 milli Curie, 87.6 millicurie, Intravenous, Once PRN, Dorothy Spark, MD  PAST MEDICAL HISTORY: Past Medical History:  Diagnosis Date  . Adrenal insufficiency ([place])   . Anemia    denies  . Anxiety   . Basal cell carcinoma    eye lid  . Chronic kidney disease, stage 3   . Cough    hx. dry cough chronic  . Disorder of tear duct system    frequent tearing of eyes  . GERD (gastroesophageal reflux disease)   . H/O: whooping cough 2000  . History of kidney infection 2015   ? sepsis  . History of kidney stones   . History of kidney stones   . Hyperlipidemia   . Hypertension   . IBS (irritable bowel syndrome)    controlled with med  . Osteoarthritis    arthritis -joints-limited ROM shoulders more on right  . PONV (postoperative nausea and vomiting)     per pt hx of low  BP following shoulder surgery in 05-2015; was managed in hospital with IV fluids and d/c BP being taken a tthe time; pt was dced and F/U with endcrinoopgist who dx with adrenal insurffiency and started steroid therapy; treatmetnt with positive outome, now managed chronically on hydrocortisone tablets;  Marland Kitchen Sleep apnea    no longer using cpap  . 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 UTI (lower urinary tract infection)   . Wolff-Parkinson-White (WPW) syndrome 1990s   had ablation done  and now managed on carvedilol     PAST SURGICAL HISTORY: Past Surgical History:  Procedure Laterality Date  . ablation surgery     1993 [facility] (Dr. Hulen Skains)  . APPENDECTOMY    . CARDIAC ELECTROPHYSIOLOGY STUDY AND ABLATION     '93 for WPW syndrome  . CARPAL TUNNEL RELEASE Right 12/21/2014   Procedure: RIGHT CARPAL TUNNEL RELEASE ;  Surgeon: Netta Cedars, MD;  Location: [place];  Service: Orthopedics;  Laterality: Right;  . CARPAL TUNNEL RELEASE Left   . CERVICAL SPINE SURGERY     cervical fusion with hardware retained  . COLON SURGERY     "colon detached from abdomen"  . COLONOSCOPY WITH PROPOFOL N/A 05/10/2014   Procedure: COLONOSCOPY WITH PROPOFOL;  Surgeon: Juanita Craver, MD;  [facility];  Service: Endoscopy;  Laterality: N/A;  . CYSTOSCOPY/RETROGRADE/URETEROSCOPY Left 12/18/2013   Procedure: CYSTOSCOPY/RETROGRADE/LEFT STENT;  Surgeon: Malka So, MD;  [facility]: [facility];  Service: Urology;  Laterality: Left;  . ESOPHAGOGASTRODUODENOSCOPY (EGD) WITH PROPOFOL N/A 05/10/2014   Procedure: ESOPHAGOGASTRODUODENOSCOPY (EGD) WITH PROPOFOL;  Surgeon: Juanita Craver, MD;  [facility];  Service: Endoscopy;  Laterality: N/A;  . EYELID CARCINOMA EXCISION    . INSERTION OF DIALYSIS CATHETER N/A  08/16/2019   Procedure: INSERTION OF DIALYSIS CATHETER;  Surgeon: Garner Nash, DO;  Location: [facility] [place];  Service: Pulmonary;  Laterality: N/A;  . JOINT REPLACEMENT    . KNEE ARTHROSCOPY Left 07/16/2016  . REVERSE SHOULDER ARTHROPLASTY Right 06/29/2014   Procedure: REVERSE TOTAL SHOULDER ARTHROPLASTY;  Surgeon: Netta Cedars, MD;  Location: [place];  Service: Orthopedics;  Laterality: Right;  . REVISION TOTAL SHOULDER TO REVERSE TOTAL SHOULDER Left 05/15/2015  .  REVISION TOTAL SHOULDER TO REVERSE TOTAL SHOULDER Left 05/15/2015   Procedure: LEFT SHOULDER REVISION TOTAL SHOULDER ARTHRPLASTY TO REVERSE TOTAL SHOULDER ARTHROPLASTY ;  Surgeon: Netta Cedars, MD;  Location: [place];  Service: Orthopedics;  Laterality: Left;  . tear duct surgery    . TOTAL ABDOMINAL HYSTERECTOMY    . TOTAL HIP ARTHROPLASTY     x 3(rt x2, lt x1)  . TOTAL KNEE ARTHROPLASTY Right 08/08/2013   Procedure: RIGHT TOTAL KNEE ARTHROPLASTY WITH SAPHENOUS NERVE SCAR EXCISION;  Surgeon: Mauri Pole, MD;  [facility];  Service: Orthopedics;  Laterality: Right;  . TOTAL KNEE ARTHROPLASTY Left 08/25/2016   Procedure: LEFT TOTAL KNEE ARTHROPLASTY;  Surgeon: Paralee Cancel, MD;  [facility];  Service: Orthopedics;  Laterality: Left;  70 mins; Adductor block  . TOTAL SHOULDER ARTHROPLASTY Left 07/15/2012   Procedure: LEFT TOTAL SHOULDER ARTHROPLASTY;  Surgeon: Augustin Schooling, MD;  Location: [place];  Service: Orthopedics;  Laterality: Left;  . TOTAL SHOULDER REVISION Right 12/21/2014   Procedure: RIGHT SHOULDER POLY EXCHANGE ;  Surgeon: Netta Cedars, MD;  Location: [place];  Service: Orthopedics;  Laterality: Right;  . URETEROSCOPY  stent placement    FAMILY HISTORY: Family History  Problem Relation Age of Onset  . Leukemia Brother   . Heart disease Mother   . Hypertension Mother   . Hyperlipidemia Mother   . Heart attack Mother   . Heart failure Mother   . Heart disease Maternal Grandfather   . Heart attack Maternal  Grandfather   . Cancer Paternal Grandfather   . Lung cancer Paternal Grandfather   . Cancer Maternal Uncle        bone    SOCIAL HISTORY:  Social History   Socioeconomic History  . Marital status: Married    Spouse name: Toy Baker  . Number of children: 0  . Years of education: Not on file  . Highest education level: Not on file  Occupational History  . Occupation: Retired     Fish farm manager: [employer]  Tobacco Use  . Smoking status: Never Smoker  . Smokeless tobacco: Never Used  Vaping Use  . Vaping Use: Never used  Substance and Sexual Activity  . Alcohol use: No    Alcohol/week: 0.0 standard drinks  . Drug use: No  . Sexual activity: Not on file  Other Topics Concern  . 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 Difficulty of Paying Living Expenses:   Food Insecurity:   . Worried About Charity fundraiser in the Last Year:   . Arboriculturist in the Last Year:   Transportation Needs:   . Film/video editor (Medical):   Marland Kitchen Lack of Transportation (Non-Medical):   Physical Activity:   . Days of Exercise per Week:   . Minutes of Exercise per Session:   Stress:   . Feeling of Stress :   Social Connections:   . Frequency of  Communication with Friends and Family:   . Frequency of Social Gatherings with Friends and Family:   . Attends Religious Services:   . Active Member of Clubs or Organizations:   . Attends Archivist Meetings:   Marland Kitchen Marital Status:   Intimate Partner Violence:   . Fear of Current or Ex-Partner:   .  Emotionally Abused:   Marland Kitchen Physically Abused:   . Sexually Abused:      PHYSICAL EXAM  Vitals:   08/29/19 1306  BP: (!) 142/77  Pulse: 69  SpO2: 98%  Weight: 278 lb 9.6 oz (126.4 kg)  Height: 5\' 5"  (1.651 m)    Body mass index is 46.36 kg/m.   General: The patient is well-developed and well-nourished and in no acute distress  HEENT:  Head is [place]/AT.  Sclera are anicteric.  Funduscopic exam shows normal optic discs and retinal vessels.  Neck: No carotid bruits are noted.  The neck is nontender.  Cardiovascular: The heart has a regular rate and rhythm with a normal S1 and S2. There were no murmurs, gallops or rubs.    Skin: Extremities are without rash or  edema.  Musculoskeletal:  Back is nontender  Neurologic Exam  Mental status: The patient is alert and oriented x 3 at the time of the examination. The patient has apparent normal recent and remote memory, with an apparently normal attention span and concentration ability.   Speech is normal.  Cranial nerves: Extraocular movements are full. Pupils are equal, round, and reactive to light and accomodation.  Color vision is normal and symmetric facial symmetry is present. There is good facial sensation to soft touch bilaterally.Facial strength is normal.  Trapezius and sternocleidomastoid strength is normal. No dysarthria is noted.  The tongue is midline, and the patient has symmetric elevation of the soft palate. No obvious hearing deficits are noted.  Motor:  Muscle bulk is normal.   Tone is normal.  Strength is 5/5 in the arms, 3 to 4 -/5 in proximal legs, 4/5 with quadriceps and 4 -/5 in the feet and ankles  Sensory: Absent vibration sensation in right leg and 80% reduced left knee and absent left foot .  Nomral vibration and touch in hands.   Reduced sensation to temperature/touch in legs and lower flanks to chest, right worse than left     Coordination: Cerebellar testing reveals good finger-nose-finger.  She cannot do  heel-to-shin.  Gait and station: She is able to stand with unilateral support.  She cannot walk without her walker.  With a walker, steps have a fairly good stride.  Romberg is positive.   Reflexes: Deep tendon reflexes are symmetric and normal bilaterally.   Plantar responses are flexor.    DIAGNOSTIC DATA (LABS, IMAGING, TESTING) - I reviewed patient records, labs, notes, testing and imaging myself where available.  Lab Results  Component Value Date   WBC 7.2 08/24/2019   HGB 9.9 (L) 08/24/2019   HCT 30.9 (L) 08/24/2019   MCV 96.0 08/24/2019   PLT 165 08/24/2019      Component Value Date/Time   NA 137 08/24/2019 0417   K 3.7 08/24/2019 0417   CL 110 08/24/2019 0417   CO2 22 08/24/2019 0417   GLUCOSE 110 (H) 08/24/2019 0417   BUN 7 (L) 08/24/2019 0417   CREATININE 0.97 08/24/2019 0417   CREATININE 0.87 06/18/2015 1207   CALCIUM 8.0 (L) 08/24/2019 0417   PROT 4.7 (L) 08/23/2019 0416   ALBUMIN 3.6 08/23/2019 0416  ALBUMIN 4.0 08/10/2019 2356   AST 14 (L) 08/23/2019 0416   ALT 12 08/23/2019 0416   ALKPHOS 20 (L) 08/23/2019 0416   BILITOT 0.6 08/23/2019 0416   GFRNONAA >60 08/24/2019 0417   GFRAA >60 08/24/2019 0417   Lab Results  Component Value Date   CHOL 79 08/17/2019   HDL 22 (L) 08/17/2019   LDLCALC 29 08/17/2019   TRIG 141 08/17/2019   CHOLHDL 3.6 08/17/2019   No results found for: HGBA1C Lab Results  Component Value Date   VITAMINB12 376 08/11/2019   Lab Results  Component Value Date   TSH 3.119 08/11/2019       ASSESSMENT AND PLAN  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  Additionally, she has an enhancing lesion in the brain that is likely asymptomatic.  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  However, while in the hospital, anti-NMO and MOG were both negative.  About 80% of patients with neuromyelitis optica spectrum disorder (NMO SD) have antibodies against aquaporin4 (NMO Ab) and 7 to 10% against MOG but at least 10% would not have antibodies on standard testing.  I discussed with her that there is a more accurate test that requires special collection and processing of is only performed at a few places (i.e. [facility]).  We will try to get this test for her.  Because of the high likelihood that this represents NMOSD, we will likely start Rituxan therapy.  The decision will be easier to make if she is positive.  However, I discussed with her that even if she is negative we could consider treatment as a second exacerbation could lead her with even worse permanent neurologic impairment.  We will check some additional blood work for hepatitis B which will be necessary before considering Rituxan.  She will return to see me in a couple months or sooner if she has new or worsening neurologic symptoms.  Thank you for asking me to see Ms. Wooldridge.  Please let me know if I need further assistance with her or other patients in the future.   Axyl Sitzman A. Felecia Shelling, MD, [facility] 7/74/1287, 8:67 PM Certified in Neurology, Clinical Neurophysiology, Sleep Medicine and Neuroimaging  [facility] [address] [place], [place] [address] [phone]

## 2019-08-29 NOTE — Telephone Encounter (Signed)
Placed order to [facility] clinic for NMO/MOG online. Pending insurance investigation. Pt given order/scheduling instructions while in office today.

## 2019-08-30 LAB — HEPATITIS B CORE ANTIBODY, TOTAL: Hep B Core Total Ab: NEGATIVE

## 2019-08-30 LAB — HEPATITIS B SURFACE ANTIGEN: Hepatitis B Surface Ag: NEGATIVE

## 2019-08-30 LAB — ANA W/REFLEX: Anti Nuclear Antibody (ANA): NEGATIVE

## 2019-08-30 LAB — HEPATITIS B SURFACE ANTIBODY,QUALITATIVE: Hep B Surface Ab, Qual: NONREACTIVE

## 2019-09-08 ENCOUNTER — Telehealth: Payer: Self-pay | Admitting: Neurology

## 2019-09-08 NOTE — Telephone Encounter (Signed)
Anda Kraft from the [facility] called stating she had several questions on a sample that was sent in for the pt. Please reference number [id number] when you call back.

## 2019-09-11 NOTE — Telephone Encounter (Addendum)
Tried calling back x2. Received automated message that "there are no insurance carriers available at the moment, to please try again later".  I checked status of lab online at [facility] and it states specimen was delivered.

## 2019-09-11 NOTE — Telephone Encounter (Addendum)
Tried calling number again and got same automated message below. Tried calling [phone] that was on the order form and spoke with Raquel Sarna. They said they had no client ID or test on lab result they got but this has since been resolved. Nothing further needed.

## 2019-09-12 ENCOUNTER — Telehealth: Payer: Self-pay | Admitting: Neurology

## 2019-09-12 NOTE — Telephone Encounter (Signed)
[facility] back and spoke with [place]. I advised our office did not collect sample. Sample collected at [facility] located at [address] [place], [place], [place] [address]. Their phone number is [phone]. They will follow up with their lab to see if sample can be resent, if not, it will need to be redrawn. They will follow up with our office if any further issues arise. I informed Dr. Felecia Shelling about this.

## 2019-09-12 NOTE — Telephone Encounter (Signed)
Received call from [facility]) reference number: [id number]. Receive order sample type. Require serum and received full blood. Need additional sample of serum. If sample is not received will have to cancel test. Would like a phone call.

## 2019-09-14 NOTE — Telephone Encounter (Signed)
I entered new lab orders on [facility] website for pt. Called pt to let her know. She will go back to [facility] and get redraw completed. Aware she will not be charged for this and our rep with follow her case to make sure this is done correctly. She verbalized understanding and appreciation.

## 2019-09-14 NOTE — Telephone Encounter (Signed)
Received the following message from [place] at 1519 today ([place]): "I was just informed that the patient and lab connected.  The patient is being draw tomorrow. Thank you for your patience"

## 2019-09-14 NOTE — Telephone Encounter (Signed)
Took call from phone staff and spoke with [facility] rep). She received message. States she reached out to her manager and escalated pt case. They will reach out to [facility] to ensure they are drawing sample correctly/send correct sample. She apologized for any inconvenience.New orders will need to be placed for the patient and the patient will just need to go back and get labs redrawn. They will flag labs to let them know not to charge pt for redraw. Once order is placed, it will go directly to the pt service center and they will be able to pull order when pt goes in for redraw. Pt does not need to bring in paper copy of lab orders. Belenda Cruise will continue to monitor pt's case to ensure it is completed correctly.

## 2019-09-14 NOTE — Telephone Encounter (Signed)
Received e-mail from [facility] clinic on 09/13/19 that lab test cx d/t incorrect sample received. They received whole blood and not serum. I called pt to inform her. She has not heard from [facility] or [facility] about this. I instructed her to call [facility] at [phone] to try and schedule a redraw. Advised I will also reach out to [facility] to see if they can make sure this occurs. She will call back if she runs into any issues scheduling the re-draw.   Called the following rep for [facility]. LVM for her to call back to further discuss this.  Katherine A. Alonna Minium, M.S. Clinical Specialty Representative-Neurology [place] [phone]

## 2019-09-19 NOTE — Telephone Encounter (Signed)
Melissa from the [facility] called stating that they received a specimen on the pt but there were no orders for it. Please call [phone] and use WBL#[id number]

## 2019-09-19 NOTE — Telephone Encounter (Signed)
Called Katherine A. Alonna Minium M.S.   Clinical Specialty Representative-Neurology [place] [phone]  LVM for her to call office back to discuss patient.

## 2019-09-20 NOTE — Telephone Encounter (Signed)
I sent urgent email to [facility] about pt. Awaiting response for update

## 2019-09-20 NOTE — Telephone Encounter (Signed)
Received the following message from [facility]:  "I am so sorry I missed your call yesterday.  I did just call our customer service line with the reference number below.  They were giving you a courtesy call regarding the first order that was placed that they were discarding the whole blood for the first order for Cambrie Sonnenfeld since the first test was cancelled.  The specimen is in the lab for the second order you put in last week and is due to report out sometime next week. I believe you should get an email when it reports out but I will put a note in my calendar to check on it on my end and can let you know when I see it report out.  I have a reference number for my call today for the new order.  It is  [id number]  Thank you for reaching out and if anything else at all arises please let me know.  Have a great day,  Belenda Cruise"

## 2019-09-25 NOTE — Telephone Encounter (Signed)
Received lab results from [facility] drawn 09/15/19: MOG FAC, S: negative and NMO/AQP4 FACS, S: negative. Gave to MD to review.

## 2019-09-27 ENCOUNTER — Telehealth: Payer: Self-pay | Admitting: Neurology

## 2019-09-27 NOTE — Telephone Encounter (Signed)
I spoke to Connie Haynes about the results of the [facility] anti-NMO and anti-MOG test.  Both were negative.  Despite that result, I am very concerned that she is at an elevated risk of a relapse.  Besides the inflammatory transverse myelitis, she also had an enhancing lesion in the brain.  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  Other tests including vasculitis labs were negative though CNS vasculitis would not always show up in blood work.  Because of the strong possibility that there will be a relapse, I believe treatment make sense.  She has had adrenal insufficiency and was placed on Cortef.  I think Rituxan would be the best option for her.  Between her hospital stay and her last visit we have check the necessary blood work.   We will treat with rituximab 1000 mg on day 1 and 1000 mg on day 15, repeated every 6 months with standard premedication (can do 60 mg IV Solu-Medrol instead of 125 due to her adrenal insufficiency)

## 2019-10-02 NOTE — Telephone Encounter (Signed)
Gave completed/signed order with office notes to [facility] to start processing for pt/get insurance approval. Sent copy to epic to be scanned.

## 2019-11-20 ENCOUNTER — Telehealth: Payer: Self-pay | Admitting: Neurology

## 2019-11-20 ENCOUNTER — Encounter: Payer: Self-pay | Admitting: Neurology

## 2019-11-20 ENCOUNTER — Ambulatory Visit: Payer: Medicare Other | Admitting: Neurology

## 2019-11-20 ENCOUNTER — Other Ambulatory Visit: Payer: Self-pay

## 2019-11-20 VITALS — BP 135/65 | HR 61 | Ht 65.0 in | Wt 274.5 lb

## 2019-11-20 DIAGNOSIS — Z79899 Other long term (current) drug therapy: Secondary | ICD-10-CM

## 2019-11-20 DIAGNOSIS — R269 Unspecified abnormalities of gait and mobility: Secondary | ICD-10-CM | POA: Diagnosis not present

## 2019-11-20 DIAGNOSIS — R200 Anesthesia of skin: Secondary | ICD-10-CM | POA: Diagnosis not present

## 2019-11-20 DIAGNOSIS — G369 Acute disseminated demyelination, unspecified: Secondary | ICD-10-CM | POA: Diagnosis not present

## 2019-11-20 MED ORDER — ALPRAZOLAM 0.5 MG PO TABS: ORAL_TABLET | ORAL | 0 refills | Status: DC

## 2019-11-20 MED ORDER — PREDNISONE 20 MG PO TABS
ORAL_TABLET | ORAL | 0 refills | Status: DC
Start: 2019-11-20 — End: 2020-10-30

## 2019-11-20 NOTE — Progress Notes (Signed)
[facility]  PATIENT: Connie Haynes DOB: 1955-12-11  REFERRING DOCTOR OR PCP: Elza Rafter, MD SOURCE: Patient, notes from recent hospitalization, imaging and laboratory reports, MRI images personally reviewed.  _________________________________   HISTORICAL  CHIEF COMPLAINT:  Chief Complaint  Patient presents with  . Follow-up    RM 12 with husband. Last seen 08/29/2019. On Rituxan. Receives at [facility] w/ intrafusion. Last: 11/08/19 and next: 06/02/19.  Had reaction to infusion (itching/rash). Just now starting to feel better. Wants to discuss this treatment.  Marland Kitchen RLS    Takes mirapex. Feels sx have worsened.   . Gait Problem    Ambulates with cane. No falls since last visit. Stumbles often per pt.    HISTORY OF PRESENT ILLNESS:  I had the pleasure of seeing your patient, Connie Haynes, at the [place] [facility] at [facility] for neurologic consultation regarding her transverse myelitis and abnormal brain MRI.   Update 11/20/2019: We received the results of the [facility] anti-NMO and anti-MOG test.  Both were negative.  MRIs showed a longitudinal enhancing lesion in the lower cervical and upper thoracic spine (C4-T4) and the brain showed 2 foci, one that enhanced near the basal ganglia on the right.  We had discussed that, despite that result, I am very concerned that she is at an elevated risk of a relapse.  Besides the inflammatory transverse myelitis, she also had an enhancing lesion in the brain.  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  Other tests including vasculitis labs were negative though CNS vasculitis would not always show up in blood work.  Because of the strong possibility that there will be a relapse, I started Rituxan (she is already on Cortef for adrenal insufficiency).  She received rituximab 1000 mg on day 1 and 1000 mg on day 15, repeated every 6 months with standard premedication (can do 60 mg IV Solu-Medrol instead of 125  due to her adrenal insufficiency).   She felt very fatigued the night after the infusion and she had itching.     Currently, she has numbness from the waist down.  Thighs seem more fluctuating but groin is always numb.   She has trouble knowing when she needs to void/defectaion and is trying to schedule this with some benefit.   She is not having incontinence (justr twice in total)  but needs to wake up once each night.   She is able to walk with a cane.   Balance is poor.   She looks down to walk.    She is better than in July but no difference compared to a month ago.     She received the Covid 19 vaccination [employer]) February 1, February 22, Oct 01 2019.     LETM/NMOSD HISTORY In late June 2021, she had the onset of urinary retention and the next morning had numbness from the waist down.   She initially presented to the ED but left due to the long wait.   She re-presented to [facility] emergency room when she had worsening with more intense numbness, weakness and reduced gait.  MRIs showed a longitudinal enhancing lesion in the lower cervical and upper thoracic spine (C4-T4) and the brain showed 2 foci, one that enhanced near the basal ganglia on the right.    She was admitted for further evaluation and treatment.  She had high dose steroids for 5 days but did not note a benefit.  She then had 5 treatments of  plasma exchange..    Strength improved 08/22/2019 and she was able to take a few steps.   She was doing some physical therapy while in the hospital.  By the time of discharge, she was able to use a walker.  She improved further over the next month but continued to have numbness and reduced gait.      She has adrenal insufficiency and is on Cortef.  She also has stage 2 CKD, HTN, WPW (S/p ablation).  I personally reviewed the MRIs of the lumbar spine, thoracic spine, cervical spine and brain performed 08/10/2019 and 08/11/2019.  Imaging review: MRI brain 08/11/2019: There is a 9 mm enhancing focus in the  inferior right frontal lobe associated with some mass-effect.  Additionally, there is a small left T2/flair hyperintense focus just above the left internal capsule.  The etiology of the focus lesion is uncertain.  It could represent a focus of demyelination.  Neoplasm cannot be completely ruled out.  The more chronic appearing focus could be demyelinating or ischemic  MRI of the cervical spine and thoracic spine 08/10/2019: Central to posterior enhancing focus within the spinal cord from C4-T4.  This causes some enlargement of the cord.  Additionally there is ACDF at C5-C7 and DJD at C3-C4 and C4-C5.  MRI of the lumbar spine 08/10/2019: There is anterolisthesis of L3 upon L4 associated with facet hypertrophy and protrusion of the uncovered disc.  There is foraminal narrowing, more on the right.  Milder degenerative changes are noted at some of the lumbar levels but no spinal stenosis or significant nerve root compression.  Laboratory review While in the hospital 08/2019, she had a LP (CSF was normal except slightly elevated protein); NMO Ab (negative), anti-MOG (negative).  Additionally she had ANCA, HIV, TB, B12, rheumatoid factor, anti-Jo 1, ENA, HSV, ACE.  These were all negative or normal.    July 2021:  Hep B cAb, Hep B sAg, Hep B sAb all negative.    ANA was negative  [facility] anti-Mog and anti-NMO were negative 09/2019   REVIEW OF SYSTEMS: Constitutional: No fevers, chills, sweats, or change in appetite Eyes: No visual changes, double vision, eye pain Ear, nose and throat: No hearing loss, ear pain, nasal congestion, sore throat Cardiovascular: No chest pain, palpitations Respiratory: No shortness of breath at rest or with exertion.   No wheezes GastrointestinaI: No nausea, vomiting, diarrhea, abdominal pain, fecal incontinence Genitourinary: No dysuria, urinary retention or frequency.  No nocturia. Musculoskeletal: No neck pain, back pain Integumentary: No rash, pruritus, skin  lesions Neurological: as above Psychiatric: No depression at this time.  No anxiety Endocrine: No palpitations, diaphoresis, change in appetite, change in weigh or increased thirst Hematologic/Lymphatic: No anemia, purpura, petechiae. Allergic/Immunologic: No itchy/runny eyes, nasal congestion, recent allergic reactions, rashes  ALLERGIES: Allergies  Allergen Reactions  . Codeine Nausea And Vomiting    Can take if takes phernergan  . Hydrocodone Nausea Only  . Robaxin [Methocarbamol] Rash    HOME MEDICATIONS:  Current Outpatient Medications:  .  AMITIZA 24 MCG capsule, Take 24 mcg by mouth 2 (two) times daily. , Disp: , Rfl:  .  carvedilol (COREG) 12.5 MG tablet, Take 12.5 mg by mouth 2 (two) times daily. , Disp: , Rfl:  .  citalopram (CELEXA) 20 MG tablet, Take 20 mg by mouth at bedtime., Disp: , Rfl:  .  hydrocortisone (CORTEF) 10 MG tablet, Take 5-10 mg by mouth See admin instructions. Take 10mg  in the AM and 5mg  in the afternoon.,  Disp: , Rfl:  .  Multiple Vitamin (MULTIVITAMIN ADULT PO), Take 1 tablet by mouth daily., Disp: , Rfl:  .  pramipexole (MIRAPEX) 1 MG tablet, Take 1.5 mg by mouth at bedtime. , Disp: , Rfl:  .  promethazine (PHENERGAN) 25 MG tablet, Take 1 tablet (25 mg total) by mouth every 6 (six) hours as needed for nausea or vomiting. (Patient taking differently: Take 25 mg by mouth at bedtime. ), Disp: 60 tablet, Rfl: 0 .  telmisartan-hydrochlorothiazide (MICARDIS HCT) 40-12.5 MG tablet, Take 1 tablet by mouth every morning., Disp: , Rfl:  .  tiZANidine (ZANAFLEX) 4 MG tablet, Take 1.5 tablets (6 mg total) by mouth every 8 (eight) hours as needed for muscle spasms., Disp: 30 tablet, Rfl: 0 .  ALPRAZolam (XANAX) 0.5 MG tablet, Take 2 or three before MRI, Disp: 3 tablet, Rfl: 0 .  predniSONE (DELTASONE) 20 MG tablet, Take 3 pills po the day before infusions., Disp: 12 tablet, Rfl: 0 No current facility-administered medications for this visit.  Facility-Administered  Medications Ordered in Other Visits:  .  chlorhexidine (HIBICLENS) 4 % liquid 4 application, 60 mL, Topical, Once, Dixon, Robb Matar, PA-C .  technetium tetrofosmin (TC-MYOVIEW) injection 31.7 milli Curie, 37.3 millicurie, Intravenous, Once PRN, Dorothy Spark, MD  PAST MEDICAL HISTORY: Past Medical History:  Diagnosis Date  . Adrenal insufficiency ([place])   . Anemia    denies  . Anxiety   . Basal cell carcinoma    eye lid  . Chronic kidney disease, stage 3   . Cough    hx. dry cough chronic  . Disorder of tear duct system    frequent tearing of eyes  . GERD (gastroesophageal reflux disease)   . H/O: whooping cough 2000  . History of kidney infection 2015   ? sepsis  . History of kidney stones   . History of kidney stones   . Hyperlipidemia   . Hypertension   . IBS (irritable bowel syndrome)    controlled with med  . Osteoarthritis    arthritis -joints-limited ROM shoulders more on right  . PONV (postoperative nausea and vomiting)     per pt hx of low BP following shoulder surgery in 05-2015; was managed in hospital with IV fluids and d/c BP being taken a tthe time; pt was dced and F/U with endcrinoopgist who dx with adrenal insurffiency and started steroid therapy; treatmetnt with positive outome, now managed chronically on hydrocortisone tablets;  Marland Kitchen Sleep apnea    no longer using cpap  . 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 UTI (lower urinary tract infection)   . Wolff-Parkinson-White (WPW) syndrome 1990s   had ablation done  and now managed on carvedilol     PAST SURGICAL HISTORY: Past Surgical History:  Procedure Laterality Date  . ablation surgery     1993 [facility] (Dr. Hulen Skains)  . APPENDECTOMY    . CARDIAC ELECTROPHYSIOLOGY STUDY AND ABLATION      '93 for WPW syndrome  . CARPAL TUNNEL RELEASE Right 12/21/2014   Procedure: RIGHT CARPAL TUNNEL RELEASE ;  Surgeon: Netta Cedars, MD;  Location: [place];  Service: Orthopedics;  Laterality: Right;  . CARPAL TUNNEL RELEASE Left   . CERVICAL SPINE SURGERY     cervical fusion with hardware  retained  . COLON SURGERY     "colon detached from abdomen"  . COLONOSCOPY WITH PROPOFOL N/A 05/10/2014   Procedure: COLONOSCOPY WITH PROPOFOL;  Surgeon: Juanita Craver, MD;  Location: [facility];  Service: Endoscopy;  Laterality: N/A;  . CYSTOSCOPY/RETROGRADE/URETEROSCOPY Left 12/18/2013   Procedure: CYSTOSCOPY/RETROGRADE/LEFT STENT;  Surgeon: Malka So, MD;  Location: [facility];  Service: Urology;  Laterality: Left;  . ESOPHAGOGASTRODUODENOSCOPY (EGD) WITH PROPOFOL N/A 05/10/2014   Procedure: ESOPHAGOGASTRODUODENOSCOPY (EGD) WITH PROPOFOL;  Surgeon: Juanita Craver, MD;  [facility];  Service: Endoscopy;  Laterality: N/A;  . EYELID CARCINOMA EXCISION    . INSERTION OF DIALYSIS CATHETER N/A 08/16/2019   Procedure: INSERTION OF DIALYSIS CATHETER;  Surgeon: Garner Nash, DO;  Location: [facility];  Service: Pulmonary;  Laterality: N/A;  . JOINT REPLACEMENT    . KNEE ARTHROSCOPY Left 07/16/2016  . REVERSE SHOULDER ARTHROPLASTY Right 06/29/2014   Procedure: REVERSE TOTAL SHOULDER ARTHROPLASTY;  Surgeon: Netta Cedars, MD;  Location: [place];  Service: Orthopedics;  Laterality: Right;  . REVISION TOTAL SHOULDER TO REVERSE TOTAL SHOULDER Left 05/15/2015  . REVISION TOTAL SHOULDER TO REVERSE TOTAL SHOULDER Left 05/15/2015   Procedure: LEFT SHOULDER REVISION TOTAL SHOULDER ARTHRPLASTY TO REVERSE TOTAL SHOULDER ARTHROPLASTY ;  Surgeon: Netta Cedars, MD;  Location: [place];  Service: Orthopedics;  Laterality: Left;  . tear duct surgery    . TOTAL ABDOMINAL HYSTERECTOMY    . TOTAL HIP ARTHROPLASTY     x 3(rt x2, lt x1)  . TOTAL KNEE ARTHROPLASTY Right 08/08/2013   Procedure: RIGHT TOTAL KNEE ARTHROPLASTY WITH SAPHENOUS  NERVE SCAR EXCISION;  Surgeon: Mauri Pole, MD;  [facility];  Service: Orthopedics;  Laterality: Right;  . TOTAL KNEE ARTHROPLASTY Left 08/25/2016   Procedure: LEFT TOTAL KNEE ARTHROPLASTY;  Surgeon: Paralee Cancel, MD;  [facility];  Service: Orthopedics;  Laterality: Left;  70 mins; Adductor block  . TOTAL SHOULDER ARTHROPLASTY Left 07/15/2012   Procedure: LEFT TOTAL SHOULDER ARTHROPLASTY;  Surgeon: Augustin Schooling, MD;  Location: [place];  Service: Orthopedics;  Laterality: Left;  . TOTAL SHOULDER REVISION Right 12/21/2014   Procedure: RIGHT SHOULDER POLY EXCHANGE ;  Surgeon: Netta Cedars, MD;  Location: [place];  Service: Orthopedics;  Laterality: Right;  . URETEROSCOPY  stent placement    FAMILY HISTORY: Family History  Problem Relation Age of Onset  . Leukemia Brother   . Heart disease Mother   . Hypertension Mother   . Hyperlipidemia Mother   . Heart attack Mother   . Heart failure Mother   . Heart disease Maternal Grandfather   . Heart attack Maternal Grandfather   . Cancer Paternal Grandfather   . Lung cancer Paternal Grandfather   . Cancer Maternal Uncle        bone    SOCIAL HISTORY:  Social History   Socioeconomic History  . Marital status: Married    Spouse name: Toy Baker  . Number of children: 0  . Years of education: Not on file  . Highest education level: Not on file  Occupational History  . Occupation: Retired     Fish farm manager: [employer]  Tobacco Use  . Smoking status: Never Smoker  . Smokeless tobacco: Never Used  Vaping Use  . Vaping Use: Never used  Substance and Sexual Activity  . Alcohol use: No    Alcohol/week: 0.0 standard drinks  . Drug use: No  . Sexual activity: Not on file  Other Topics Concern  . 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 Difficulty of Paying Living Expenses: Not on file  Food Insecurity:   . Worried About Charity fundraiser in the Last Year: Not on file  . Ran Out of Food in the Last Year: Not on file  Transportation Needs:   . Lack of Transportation (Medical): Not on file  . Lack of Transportation (Non-Medical): Not on file  Physical Activity:   . Days of Exercise per Week: Not on file  . Minutes of Exercise per Session: Not on file  Stress:   . Feeling of Stress : Not on file  Social Connections:   . Frequency of Communication with Friends and Family: Not on file  . Frequency of Social Gatherings with Friends and Family: Not on file  . Attends Religious Services: Not on file  . Active Member of Clubs or Organizations: Not on file  . Attends Archivist Meetings: Not on file  . Marital Status: Not on file  Intimate Partner Violence:   . Fear of Current or Ex-Partner: Not on file  . Emotionally Abused: Not on file  . Physically Abused: Not on file  . Sexually Abused: Not on file     PHYSICAL EXAM  Vitals:   11/20/19 0816  BP: 135/65  Pulse: 61  Weight: 274 lb 8 oz (124.5 kg)  Height: 5\' 5"  (1.651 m)    Body mass index is 45.68 kg/m.   General: The patient is well-developed and well-nourished and in no acute distress  HEENT:  Head is Leon/AT.  Sclera are anicteric.   Neck: No carotid bruits are noted.  The neck is nontender.  Skin: Extremities are without rash or edema.  Neurologic Exam  Mental status: The patient is alert and oriented x 3 at the time of the examination. The patient has apparent normal recent and remote memory, with an apparently normal attention span and concentration ability.   Speech is normal.  Cranial  nerves: Extraocular movements are full.  Color vision is normal and symmetric facial symmetry is present. There is good facial sensation to soft touch bilaterally.Facial strength is normal.  Trapezius and sternocleidomastoid strength is normal. No dysarthria is noted.   No obvious hearing deficits are noted.  Motor:  Muscle bulk is normal.   Tone is normal.  Strength is 5/5 in the arms, 3 to 4 -/5 in proximal legs, 4/5 with quadriceps and 4 -/5 in the feet and ankles  Sensory: Absent vibration sensation in right leg and 80% reduced left knee and absent left foot .  Nomral vibration and touch in hands.   Reduced sensation to temperature/touch in legs and lower flanks to chest, right worse than left     Coordination: Cerebellar testing reveals good finger-nose-finger.  She has a poor heel-to-shin.    Gait and station: She is able to stand with unilateral support.  She can walk with a cane but has a wide stance and reduced  stride.  Romberg is positive.   Reflexes: Deep tendon reflexes are symmetric and normal bilaterally.        DIAGNOSTIC DATA (LABS, IMAGING, TESTING) - I reviewed patient records, labs, notes, testing and imaging myself where available.  Lab Results  Component Value Date   WBC 7.2 08/24/2019   HGB 9.9 (L) 08/24/2019   HCT 30.9 (L) 08/24/2019   MCV 96.0 08/24/2019   PLT 165 08/24/2019      Component Value Date/Time   NA 137 08/24/2019 0417   K 3.7 08/24/2019 0417   CL 110 08/24/2019 0417   CO2 22 08/24/2019 0417   GLUCOSE 110 (H) 08/24/2019 0417   BUN 7 (L) 08/24/2019 0417   CREATININE 0.97 08/24/2019 0417   CREATININE 0.87 06/18/2015 1207   CALCIUM 8.0 (L) 08/24/2019 0417   PROT 4.7 (L) 08/23/2019 0416   ALBUMIN 3.6 08/23/2019 0416   ALBUMIN 4.0 08/10/2019 2356   AST 14 (L) 08/23/2019 0416   ALT 12 08/23/2019 0416   ALKPHOS 20 (L) 08/23/2019 0416   BILITOT 0.6 08/23/2019 0416   GFRNONAA >60 08/24/2019 0417   GFRAA >60 08/24/2019 0417   Lab Results   Component Value Date   CHOL 79 08/17/2019   HDL 22 (L) 08/17/2019   LDLCALC 29 08/17/2019   TRIG 141 08/17/2019   CHOLHDL 3.6 08/17/2019   No results found for: HGBA1C Lab Results  Component Value Date   VITAMINB12 376 08/11/2019   Lab Results  Component Value Date   TSH 3.119 08/11/2019       ASSESSMENT AND PLAN  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   Continue Rituxan.  I will have her take 60 mg prednisone the day before the infusions to see if that helps the infusion reactions more. 2.   She has continued numbness and reduced gait.  Repeat MRI of the brain and spine.  This will allow [place] to rule out alternative diagnoses. 3.   Stay active and exercise as tolerated.  Continue exercises learned in PT. 4.   Return in 5 months or sooner if there are new or worsening neurologic symptoms.  At the next visit, we will check some lab work.  Kerby Borner A. Felecia Shelling, MD, [facility] 51/76/1607, 37:10 PM Certified in Neurology, Clinical Neurophysiology, Sleep Medicine and Neuroimaging  [facility] [address] [place], [place] [address] [phone]

## 2019-11-20 NOTE — Telephone Encounter (Signed)
[employer] medicare no auth/BCBS Auth: [id number] (exp. 11/20/19 to 05/17/20) order sent to GI. They will reach out to the patient to schedule.

## 2019-12-26 ENCOUNTER — Other Ambulatory Visit: Payer: Self-pay | Admitting: *Deleted

## 2019-12-26 MED ORDER — ALPRAZOLAM 0.5 MG PO TABS: ORAL_TABLET | ORAL | 0 refills | Status: DC

## 2019-12-26 MED ORDER — PRAMIPEXOLE DIHYDROCHLORIDE 1 MG PO TABS: 1.5000 mg | ORAL_TABLET | Freq: Every day | ORAL | 5 refills | Status: DC

## 2020-01-10 ENCOUNTER — Ambulatory Visit
Admission: RE | Admit: 2020-01-10 | Discharge: 2020-01-10 | Disposition: A | Discharge status: Home / Self Care | Payer: Medicare Other | Source: Ambulatory Visit | Attending: Neurology | Admitting: Neurology

## 2020-01-10 ENCOUNTER — Other Ambulatory Visit: Payer: Self-pay

## 2020-01-10 DIAGNOSIS — G369 Acute disseminated demyelination, unspecified: Secondary | ICD-10-CM

## 2020-01-10 MED ORDER — GADOBUTROL 1 MMOL/ML IV SOLN
20.0000 mL | Freq: Once | INTRAVENOUS | Status: AC | PRN
  Administered 2020-01-10: 20 mL via INTRAVENOUS

## 2020-01-13 ENCOUNTER — Ambulatory Visit
Admission: RE | Admit: 2020-01-13 | Discharge: 2020-01-13 | Disposition: A | Discharge status: Home / Self Care | Payer: Medicare Other | Source: Ambulatory Visit | Attending: Neurology | Admitting: Neurology

## 2020-01-13 DIAGNOSIS — G369 Acute disseminated demyelination, unspecified: Secondary | ICD-10-CM

## 2020-01-13 MED ORDER — GADOBENATE DIMEGLUMINE 529 MG/ML IV SOLN
20.0000 mL | Freq: Once | INTRAVENOUS | Status: AC | PRN
  Administered 2020-01-13: 20 mL via INTRAVENOUS

## 2020-01-15 ENCOUNTER — Telehealth: Payer: Self-pay | Admitting: Neurology

## 2020-01-15 NOTE — Telephone Encounter (Signed)
I spoke to Connie Haynes about the MRI results.  The lesion in the cervical spine and the smaller lesion in the right frontal lobe both appear improved.  There were no new lesions.  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  She will let us know if she has any new symptoms.  She continues to experience numbness.  This is improved compared to the onset of her disease but we discussed that some symptoms would likely remain.

## 2020-03-07 DIAGNOSIS — E559 Vitamin D deficiency, unspecified: Secondary | ICD-10-CM | POA: Insufficient documentation

## 2020-03-18 ENCOUNTER — Other Ambulatory Visit: Payer: Self-pay | Admitting: *Deleted

## 2020-03-18 MED ORDER — TIZANIDINE HCL 4 MG PO TABS: ORAL_TABLET | ORAL | 0 refills | Status: DC

## 2020-05-02 ENCOUNTER — Ambulatory Visit (INDEPENDENT_AMBULATORY_CARE_PROVIDER_SITE_OTHER): Payer: Medicare Other | Admitting: Neurology

## 2020-05-02 ENCOUNTER — Encounter: Payer: Self-pay | Admitting: Neurology

## 2020-05-02 VITALS — BP 137/74 | HR 55 | Ht 65.0 in | Wt 281.5 lb

## 2020-05-02 DIAGNOSIS — R269 Unspecified abnormalities of gait and mobility: Secondary | ICD-10-CM

## 2020-05-02 DIAGNOSIS — G369 Acute disseminated demyelination, unspecified: Secondary | ICD-10-CM | POA: Diagnosis not present

## 2020-05-02 DIAGNOSIS — R200 Anesthesia of skin: Secondary | ICD-10-CM | POA: Diagnosis not present

## 2020-05-02 DIAGNOSIS — Z5181 Encounter for therapeutic drug level monitoring: Secondary | ICD-10-CM

## 2020-05-02 DIAGNOSIS — Z79899 Other long term (current) drug therapy: Secondary | ICD-10-CM

## 2020-05-02 MED ORDER — LORAZEPAM 0.5 MG PO TABS: 0.5000 mg | ORAL_TABLET | Freq: Three times a day (TID) | ORAL | 5 refills | Status: DC

## 2020-05-02 MED ORDER — ZONISAMIDE 100 MG PO CAPS: 100.0000 mg | ORAL_CAPSULE | Freq: Every day | ORAL | 3 refills | Status: DC

## 2020-05-02 MED ORDER — TIZANIDINE HCL 4 MG PO TABS: ORAL_TABLET | ORAL | 3 refills | Status: DC

## 2020-05-02 NOTE — Progress Notes (Signed)
[facility]  PATIENT: Connie Haynes DOB: Feb 19, 1955  REFERRING DOCTOR OR PCP: Connie Rafter, MD SOURCE: Patient, notes from recent hospitalization, imaging and laboratory reports, MRI images personally reviewed.  _________________________________   HISTORICAL  CHIEF COMPLAINT:  Chief Complaint  Patient presents with  . Follow-up    RM 12, alone. Last seen 11/20/2019. On rituxan for neuromyelitis. Last infusion dates: 10/25/19, 11/08/19. Next: 05/22/20, 06/05/20. Receives at [facility] w/ intrafusion.  Takes mirapex for RLS.  Gets headaches intermittently, pain lasts for a second and goes away. Gets pain in left eye that lasts a min and goes away. Left side numbness worsens intermittently.     HISTORY OF PRESENT ILLNESS:  I had the pleasure of seeing your patient, Connie Haynes, at the [place] center at [facility] for neurologic consultation regarding her transverse myelitis and abnormal brain MRI.  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    She tolerated the Rituxan ok (some itching).  She has had no exacerbations but does not note much change.    She is scheduled to have next Rituxan 05/22/2020.   Her LETM and ON were fairly concurrent, with the vision change noticed shortly after presenting to the hospital for her leg/groin numbness,    She is still numb in her legs and groin, left sometimes worse than right.   She has a left altitudina visual field cut (confirmed on recent [facility] VF testing with ophth.  She needs to think about her movements and often looks down as she is walking.  She needs to hold the wall when she takes a shower.     She sometimes leans against the wall.    She will feel som pressure when the bladder is full but not every time.  Similar with bowel.    She tries to schedule urination and defecation.   Strength is fine.      She gets daily HA, increased with any activity.     She received the Covid 19  vaccination [employer]) February 1, February 22, Oct 01 2019.     LETM/NMOSD HISTORY In late June 2021, she had the onset of urinary retention and the next morning had numbness from the waist down.   She initially presented to the ED but left due to the long wait.   She re-presented to [facility] emergency room when she had worsening with more intense numbness, weakness and reduced gait.  MRIs showed a longitudinal enhancing lesion in the lower cervical and upper thoracic spine (C4-T4) and the brain showed 2 foci, one that enhanced near the basal ganglia on the right.    She was admitted for further evaluation and treatment.  She had high dose steroids for 5 days but did not note a benefit.  She then had 5 treatments of plasma exchange..    Strength improved 08/22/2019 and she was able to take a few steps.   She was doing some physical therapy while in the hospital.  By the time of discharge, she was able to use a walker.  She improved further over the next month but continued to have numbness and reduced gait.      We received the results of the [facility] anti-NMO and anti-MOG test.  Both were negative.  MRIs showed a longitudinal enhancing lesion in the lower cervical and upper thoracic spine (C4-T4) and the brain showed 2 foci, one that enhanced near the basal ganglia on the right.  We had discussed that, despite that result, I am very concerned that she is at an elevated risk of a relapse.  Besides the inflammatory transverse myelitis, she also had an enhancing lesion in the brain.  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  Other tests including vasculitis labs were negative though CNS vasculitis would not always show up in blood work.  She has adrenal insufficiency and is on Cortef.  She also has stage 2 CKD, HTN, WPW (S/p ablation).  I personally reviewed the MRIs of the lumbar spine, thoracic spine, cervical spine and brain performed 08/10/2019 and 08/11/2019.  Imaging review: MRI brain  08/11/2019: There is a 9 mm enhancing focus in the inferior right frontal lobe associated with some mass-effect.  Additionally, there is a small left T2/flair hyperintense focus just above the left internal capsule.  The etiology of the focus lesion is uncertain.  It could represent a focus of demyelination.  Neoplasm cannot be completely ruled out.  The more chronic appearing focus could be demyelinating or ischemic  MRI of the cervical spine and thoracic spine 08/10/2019: Central to posterior enhancing focus within the spinal cord from C4-T4.  This causes some enlargement of the cord.  Additionally there is ACDF at C5-C7 and DJD at C3-C4 and C4-C5.  MRI of the lumbar spine 08/10/2019: There is anterolisthesis of L3 upon L4 associated with facet hypertrophy and protrusion of the uncovered disc.  There is foraminal narrowing, more on the right.  Milder degenerative changes are noted at some of the lumbar levels but no spinal stenosis or significant nerve root compression.  Laboratory review While in the hospital 08/2019, she had a LP (CSF was normal except slightly elevated protein); NMO Ab (negative), anti-MOG (negative).  Additionally she had ANCA, HIV, TB, B12, rheumatoid factor, anti-Jo 1, ENA, HSV, ACE.  These were all negative or normal.    July 2021:  Hep B cAb, Hep B sAg, Hep B sAb all negative.    ANA was negative  [facility] anti-Mog and anti-NMO were negative 09/2019   REVIEW OF SYSTEMS: Constitutional: No fevers, chills, sweats, or change in appetite Eyes: No visual changes, double vision, eye pain Ear, nose and throat: No hearing loss, ear pain, nasal congestion, sore throat Cardiovascular: No chest pain, palpitations Respiratory: No shortness of breath at rest or with exertion.   No wheezes GastrointestinaI: No nausea, vomiting, diarrhea, abdominal pain, fecal incontinence Genitourinary: No dysuria, urinary retention or frequency.  No nocturia. Musculoskeletal: No neck pain, back  pain Integumentary: No rash, pruritus, skin lesions Neurological: as above Psychiatric: No depression at this time.  No anxiety Endocrine: No palpitations, diaphoresis, change in appetite, change in weigh or increased thirst Hematologic/Lymphatic: No anemia, purpura, petechiae. Allergic/Immunologic: No itchy/runny eyes, nasal congestion, recent allergic reactions, rashes  ALLERGIES: Allergies  Allergen Reactions  . Codeine Nausea And Vomiting    Can take if takes phernergan  . Hydrocodone Nausea Only  . Robaxin [Methocarbamol] Rash    HOME MEDICATIONS:  Current Outpatient Medications:  .  AMITIZA 24 MCG capsule, Take 24 mcg by mouth 2 (two) times daily. , Disp: , Rfl:  .  carvedilol (COREG) 12.5 MG tablet, Take 12.5 mg by mouth 2 (two) times daily. , Disp: , Rfl:  .  citalopram (CELEXA) 20 MG tablet, Take 20 mg by mouth at bedtime., Disp: , Rfl:  .  hydrocortisone (CORTEF) 10 MG tablet, Take 5-10 mg by mouth See admin instructions. Take 10mg  in the AM and 5mg   in the afternoon., Disp: , Rfl:  .  LORazepam (ATIVAN) 0.5 MG tablet, Take 1 tablet (0.5 mg total) by mouth every 8 (eight) hours., Disp: 30 tablet, Rfl: 5 .  Multiple Vitamin (MULTIVITAMIN ADULT PO), Take 1 tablet by mouth daily., Disp: , Rfl:  .  pramipexole (MIRAPEX) 1 MG tablet, Take 1.5 tablets (1.5 mg total) by mouth at bedtime., Disp: 45 tablet, Rfl: 5 .  predniSONE (DELTASONE) 20 MG tablet, Take 3 pills po the day before infusions., Disp: 12 tablet, Rfl: 0 .  promethazine (PHENERGAN) 25 MG tablet, Take 1 tablet (25 mg total) by mouth every 6 (six) hours as needed for nausea or vomiting. (Patient taking differently: Take 25 mg by mouth at bedtime.), Disp: 60 tablet, Rfl: 0 .  telmisartan-hydrochlorothiazide (MICARDIS HCT) 40-12.5 MG tablet, Take 1 tablet by mouth every morning., Disp: , Rfl:  .  tiZANidine (ZANAFLEX) 4 MG tablet, Take 1 to 1 1/2 tablets by mouth 3 times a day daily as needed., Disp: 270 tablet, Rfl: 3 No  current facility-administered medications for this visit.  Facility-Administered Medications Ordered in Other Visits:  .  chlorhexidine (HIBICLENS) 4 % liquid 4 application, 60 mL, Topical, Once, Dixon, Robb Matar, PA-C .  technetium tetrofosmin (TC-MYOVIEW) injection 31.7 milli Curie, 25.3 millicurie, Intravenous, Once PRN, Dorothy Spark, MD  PAST MEDICAL HISTORY: Past Medical History:  Diagnosis Date  . Adrenal insufficiency ([place])   . Anemia    denies  . Anxiety   . Basal cell carcinoma    eye lid  . Chronic kidney disease, stage 3 ([place])   . Cough    hx. dry cough chronic  . Disorder of tear duct system    frequent tearing of eyes  . GERD (gastroesophageal reflux disease)   . H/O: whooping cough 2000  . History of kidney infection 2015   ? sepsis  . History of kidney stones   . History of kidney stones   . Hyperlipidemia   . Hypertension   . IBS (irritable bowel syndrome)    controlled with med  . Osteoarthritis    arthritis -joints-limited ROM shoulders more on right  . PONV (postoperative nausea and vomiting)     per pt hx of low BP following shoulder surgery in 05-2015; was managed in hospital with IV fluids and d/c BP being taken a tthe time; pt was dced and F/U with endcrinoopgist who dx with adrenal insurffiency and started steroid therapy; treatmetnt with positive outome, now managed chronically on hydrocortisone tablets;  Marland Kitchen Sleep apnea    no longer using cpap  . 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 UTI (lower urinary tract infection)   . Wolff-Parkinson-White (WPW) syndrome 1990s   had ablation done  and now managed on carvedilol     PAST SURGICAL HISTORY: Past Surgical History:  Procedure Laterality Date  . ablation surgery     1993 [facility]  (Dr. Hulen Skains)  . APPENDECTOMY    . CARDIAC ELECTROPHYSIOLOGY STUDY AND ABLATION     '93 for WPW syndrome  . CARPAL TUNNEL RELEASE Right 12/21/2014   Procedure: RIGHT CARPAL TUNNEL RELEASE ;  Surgeon: Netta Cedars, MD;  Location: [place];  Service: Orthopedics;  Laterality: Right;  . CARPAL TUNNEL RELEASE Left   . CERVICAL  SPINE SURGERY     cervical fusion with hardware retained  . COLON SURGERY     "colon detached from abdomen"  . COLONOSCOPY WITH PROPOFOL N/A 05/10/2014   Procedure: COLONOSCOPY WITH PROPOFOL;  Surgeon: Juanita Craver, MD;  [facility]: [facility];  Service: Endoscopy;  Laterality: N/A;  . CYSTOSCOPY/RETROGRADE/URETEROSCOPY Left 12/18/2013   Procedure: CYSTOSCOPY/RETROGRADE/LEFT STENT;  Surgeon: Malka So, MD;  Location: [facility];  Service: Urology;  Laterality: Left;  . ESOPHAGOGASTRODUODENOSCOPY (EGD) WITH PROPOFOL N/A 05/10/2014   Procedure: ESOPHAGOGASTRODUODENOSCOPY (EGD) WITH PROPOFOL;  Surgeon: Juanita Craver, MD;  [facility];  Service: Endoscopy;  Laterality: N/A;  . EYELID CARCINOMA EXCISION    . INSERTION OF DIALYSIS CATHETER N/A 08/16/2019   Procedure: INSERTION OF DIALYSIS CATHETER;  Surgeon: Garner Nash, DO;  Location: [place] [facility];  Service: Pulmonary;  Laterality: N/A;  . JOINT REPLACEMENT    . KNEE ARTHROSCOPY Left 07/16/2016  . REVERSE SHOULDER ARTHROPLASTY Right 06/29/2014   Procedure: REVERSE TOTAL SHOULDER ARTHROPLASTY;  Surgeon: Netta Cedars, MD;  Location: [place];  Service: Orthopedics;  Laterality: Right;  . REVISION TOTAL SHOULDER TO REVERSE TOTAL SHOULDER Left 05/15/2015  . REVISION TOTAL SHOULDER TO REVERSE TOTAL SHOULDER Left 05/15/2015   Procedure: LEFT SHOULDER REVISION TOTAL SHOULDER ARTHRPLASTY TO REVERSE TOTAL SHOULDER ARTHROPLASTY ;  Surgeon: Netta Cedars, MD;  Location: [place];  Service: Orthopedics;  Laterality: Left;  . tear duct surgery    . TOTAL ABDOMINAL HYSTERECTOMY    . TOTAL HIP ARTHROPLASTY     x 3(rt x2, lt x1)  . TOTAL KNEE  ARTHROPLASTY Right 08/08/2013   Procedure: RIGHT TOTAL KNEE ARTHROPLASTY WITH SAPHENOUS NERVE SCAR EXCISION;  Surgeon: Mauri Pole, MD;  [facility];  Service: Orthopedics;  Laterality: Right;  . TOTAL KNEE ARTHROPLASTY Left 08/25/2016   Procedure: LEFT TOTAL KNEE ARTHROPLASTY;  Surgeon: Paralee Cancel, MD;  [facility];  Service: Orthopedics;  Laterality: Left;  70 mins; Adductor block  . TOTAL SHOULDER ARTHROPLASTY Left 07/15/2012   Procedure: LEFT TOTAL SHOULDER ARTHROPLASTY;  Surgeon: Augustin Schooling, MD;  Location: [place];  Service: Orthopedics;  Laterality: Left;  . TOTAL SHOULDER REVISION Right 12/21/2014   Procedure: RIGHT SHOULDER POLY EXCHANGE ;  Surgeon: Netta Cedars, MD;  Location: [place];  Service: Orthopedics;  Laterality: Right;  . URETEROSCOPY  stent placement    FAMILY HISTORY: Family History  Problem Relation Age of Onset  . Leukemia Brother   . Heart disease Mother   . Hypertension Mother   . Hyperlipidemia Mother   . Heart attack Mother   . Heart failure Mother   . Heart disease Maternal Grandfather   . Heart attack Maternal Grandfather   . Cancer Paternal Grandfather   . Lung cancer Paternal Grandfather   . Cancer Maternal Uncle        bone    SOCIAL HISTORY:  Social History   Socioeconomic History  . Marital status: Married    Spouse name: Toy Baker  . Number of children: 0  . Years of education: Not on file  . Highest education level: Not on file  Occupational History  . Occupation: Retired     Fish farm manager: [employer]  Tobacco Use  . Smoking status: Never Smoker  . Smokeless tobacco: Never Used  Vaping Use  . Vaping Use: Never used  Substance and Sexual Activity  . Alcohol use: No    Alcohol/week: 0.0 standard drinks  . Drug use: No  . Sexual activity: Not on  file  Other Topics Concern  . 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   Tobacco Use/Exposure:  None   Alcohol Use: None   Drug Use:  None   Diet:  Low Fat/Low Carb   Exercise:  3-4 Days per week   Hobbies:  Reading, Travel   Left handed    Caffeine: 1 cup coffee every morning, diet coke (rare)   Social Determinants of Health   Financial Resource Strain: Not on file  Food Insecurity: Not on file  Transportation Needs: Not on file  Physical Activity: Not on file  Stress: Not on file  Social Connections: Not on file  Intimate Partner Violence: Not on file     PHYSICAL EXAM  Vitals:   05/02/20 0805  BP: 137/74  Pulse: (!) 55  Weight: 281 lb 8 oz (127.7 kg)  Height: 5\' 5"  (1.651 m)    Body mass index is 46.84 kg/m.   General: The patient is well-developed and well-nourished and in no acute distress  HEENT:  Head is [place]/AT.  Sclera are anicteric.   Neck: No carotid bruits are noted.  The neck is nontender.  Skin: Extremities are without rash or edema.  Neurologic Exam  Mental status: The patient is alert and oriented x 3 at the time of the examination. The patient has apparent normal recent and remote memory, with an apparently normal attention span and concentration ability.   Speech is normal.  Cranial nerves: Extraocular movements are full.  Color vision is normal and symmetric facial symmetry is present. There is good facial sensation to soft touch bilaterally.Facial strength is normal.  Trapezius and sternocleidomastoid strength is normal. No dysarthria is noted.   No obvious hearing deficits are noted.  Motor:  Muscle bulk is normal.   Tone is normal.  Strength is 5/5 in the arms, 3 to 4 -/5 in proximal legs, 4/5 with quadriceps and 4 -/5 in the feet and ankles  Sensory: Absent vibration sensation in right leg and 80% reduced left knee and absent left foot .  Nomral vibration and touch in hands.   Reduced sensation to temperature/touch in legs and lower flanks to chest, right worse than left      Coordination: Cerebellar testing reveals good finger-nose-finger.  She has a poor heel-to-shin.    Gait and station: She is able to stand with unilateral support.  She can walk with a cane but has a wide stance and reduced stride.  Romberg is positive.   Reflexes: Deep tendon reflexes are symmetric and normal bilaterally.        DIAGNOSTIC DATA (LABS, IMAGING, TESTING) - I reviewed patient records, labs, notes, testing and imaging myself where available.  Lab Results  Component Value Date   WBC 7.2 08/24/2019   HGB 9.9 (L) 08/24/2019   HCT 30.9 (L) 08/24/2019   MCV 96.0 08/24/2019   PLT 165 08/24/2019      Component Value Date/Time   NA 137 08/24/2019 0417   K 3.7 08/24/2019 0417   CL 110 08/24/2019 0417   CO2 22 08/24/2019 0417   GLUCOSE 110 (H) 08/24/2019 0417   BUN 7 (L) 08/24/2019 0417   CREATININE 0.97 08/24/2019 0417   CREATININE 0.87 06/18/2015 1207   CALCIUM 8.0 (L) 08/24/2019 0417   PROT 4.7 (L) 08/23/2019 0416   ALBUMIN 3.6  08/23/2019 0416   ALBUMIN 4.0 08/10/2019 2356   AST 14 (L) 08/23/2019 0416   ALT 12 08/23/2019 0416   ALKPHOS 20 (L) 08/23/2019 0416   BILITOT 0.6 08/23/2019 0416   GFRNONAA >60 08/24/2019 0417   GFRAA >60 08/24/2019 0417   Lab Results  Component Value Date   CHOL 79 08/17/2019   HDL 22 (L) 08/17/2019   LDLCALC 29 08/17/2019   TRIG 141 08/17/2019   CHOLHDL 3.6 08/17/2019   No results found for: HGBA1C Lab Results  Component Value Date   VITAMINB12 376 08/11/2019   Lab Results  Component Value Date   TSH 3.119 08/11/2019       ASSESSMENT AND PLAN  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[place]) - Plan: CD19 and CD20, Flow Cytometry, IgG, IgA, IgM, CBC with Differential/Platelet  Encounter for monitoring immunomodulating therapy - Plan: CD19 and CD20, Flow Cytometry, IgG, IgA, IgM, CBC with Differential/Platelet  Numbness  Gait disturbance  High risk medication use   1.   Continue Rituxan.  Check blood work.  Because she  had infusion  reaction at the last infusion she will take 60 mg prednisone the night before.    2.   Stay active and exercise as tolerated.   3.   Continue exercises learned in PT. 4.   Return in 5-6 months or sooner if there are new or worsening neurologic symptoms.  At the next visit, we will check some lab work.  45-minute office visit with the majority of the time spent face-to-face for history and physical, discussion/counseling and decision-making.  Additional time with record review and documentation.   Jovon Streetman A. Felecia Shelling, MD, [facility] 6/94/8546, 2:70 PM Certified in Neurology, Clinical Neurophysiology, Sleep Medicine and Neuroimaging  [facility] [address] [place], [place] [address] [phone]

## 2020-05-02 NOTE — Progress Notes (Signed)
[employer] and spoke with Marcello Moores. Cx rx zonisamide sent in per MD request.

## 2020-05-03 ENCOUNTER — Other Ambulatory Visit: Payer: Self-pay | Admitting: Neurology

## 2020-05-06 ENCOUNTER — Other Ambulatory Visit: Payer: Self-pay | Admitting: Neurology

## 2020-05-06 DIAGNOSIS — G369 Acute disseminated demyelination, unspecified: Secondary | ICD-10-CM

## 2020-05-06 DIAGNOSIS — Z298 Encounter for other specified prophylactic measures: Secondary | ICD-10-CM

## 2020-05-06 NOTE — Progress Notes (Signed)
[facility]

## 2020-05-07 LAB — CBC WITH DIFFERENTIAL/PLATELET
Basophils Absolute: 0.1 10*3/uL (ref 0.0–0.2)
Basos: 2 %
EOS (ABSOLUTE): 0.6 10*3/uL — ABNORMAL HIGH (ref 0.0–0.4)
Eos: 9 %
Hematocrit: 37.9 % (ref 34.0–46.6)
Hemoglobin: 12.3 g/dL (ref 11.1–15.9)
Immature Grans (Abs): 0 10*3/uL (ref 0.0–0.1)
Immature Granulocytes: 0 %
Lymphocytes Absolute: 1 10*3/uL (ref 0.7–3.1)
Lymphs: 14 %
MCH: 29.4 pg (ref 26.6–33.0)
MCHC: 32.5 g/dL (ref 31.5–35.7)
MCV: 91 fL (ref 79–97)
Monocytes Absolute: 0.6 10*3/uL (ref 0.1–0.9)
Monocytes: 9 %
Neutrophils Absolute: 4.8 10*3/uL (ref 1.4–7.0)
Neutrophils: 66 %
Platelets: 275 10*3/uL (ref 150–450)
RBC: 4.18 x10E6/uL (ref 3.77–5.28)
RDW: 13.5 % (ref 11.7–15.4)
WBC: 7.2 10*3/uL (ref 3.4–10.8)

## 2020-05-07 LAB — IGG, IGA, IGM
IgA/Immunoglobulin A, Serum: 88 mg/dL (ref 87–352)
IgG (Immunoglobin G), Serum: 501 mg/dL — ABNORMAL LOW (ref 586–1602)
IgM (Immunoglobulin M), Srm: 17 mg/dL — ABNORMAL LOW (ref 26–217)

## 2020-05-07 LAB — CD19 AND CD20, FLOW CYTOMETRY

## 2020-05-09 ENCOUNTER — Telehealth: Payer: Self-pay | Admitting: *Deleted

## 2020-05-09 ENCOUNTER — Other Ambulatory Visit: Payer: Self-pay | Admitting: Adult Health

## 2020-05-09 ENCOUNTER — Telehealth: Payer: Self-pay | Admitting: Adult Health

## 2020-05-09 DIAGNOSIS — G369 Acute disseminated demyelination, unspecified: Secondary | ICD-10-CM

## 2020-05-09 NOTE — Telephone Encounter (Signed)
Hinton Dyer Cox spoke w/ Otilio Saber at [employer] about referral for [employer]. She states pre-auth has to be done first, they do not have a work que. We will have to call back and get patient scheduled after Josem Kaufmann is done. [phone]. Fax: [phone].   Tried initiating PA on CMM. Key: [id number]. Received the following response: "Your PA has been resolved, no additional PA is required. For further inquiries please contact the number on the back of the member prescription card". Called [employer] at [phone]. Spoke with Social research officer, government. She transferred me to pharmacy department. Spoke with Hannah/PA rep. Going through ok, paid claim for 41ml/30days. Not rejecting for PA. She transferred me to specialty department. Call was ended on their end. I called back. Spoke with Marshell L. She transferred to [employer]. She cannot help me. [employer] only involved if they need appeal. Need to speak with [facility]. Phone: [phone]. She transferred me and I spoke with Ashley/PA rep. She ran claim. 62ml/30 days covered, no PA is needed.  Dose per Dr. Felecia Shelling should be:300 mg of tixagevimab and 300 mg of cilgavimab (comes in two 1.35ml vials/150 mg of tixagevimab and 150 mg of cilgavimab) Would need this x2 to equal full dose of 300mg  for each (total of 52ml/30days).  I called Joylene Grapes. She advised me to reach out to Wilber Bihari who handles scheduling at [phone] or [phone]. I called and spoke with her. She was able to get Barrett scheduled for 05/20/20 at 12pm. I relayed no PA needed. She does not use referral que, runs data analytics to pull referral. If pt has [employer], they will need to be referred to Dr. Lake Bells (pulmonology) instead.  She will usually reach out to pt to get them scheduled within a few days once referral is placed. Code for injection: [id number]. Can give this to pt to call insurance to see if this is covered.

## 2020-05-09 NOTE — Telephone Encounter (Signed)
-----   Message from Britt Bottom, MD sent at 05/08/2020  5:19 PM EDT ----- Her IgG and IgM are low.  Therefore, I would like to reduce her dose of rituximab to 500 mg x 2 (separated by 2 weeks) every 6 months

## 2020-05-09 NOTE — Progress Notes (Signed)
I connected by phone with Huey Romans on 05/09/2020, 12:01 PM to discuss the potential use of a new treatment, tixagevimab/cilgavimab, for pre-exposure prophylaxis for prevention of coronavirus disease 2019 (COVID-19) caused by the SARS-CoV-2 virus.  This patient is a 65 y.o. female that meets the FDA criteria for Emergency Use Authorization of tixagevimab/cilgavimab for pre-exposure prophylaxis of COVID-19 disease. Pt meets following criteria:  Age >12 yr and weight > 40kg  Not currently infected with SARS-CoV-2 and has no known recent exposure to an individual infected with SARS-CoV-2 AND o Who has moderate to severe immune compromise due to a medical condition or receipt of immunosuppressive medications or treatments and may not mount an adequate immune response to COVID-19 vaccination or  o Vaccination with any available COVID-19 vaccine, according to the approved or authorized schedule, is not recommended due to a history of severe adverse reaction (e.g., severe allergic reaction) to a COVID-19 vaccine(s) and/or COVID-19 vaccine component(s).  o Patient meets the following definition of mod-severe immune compromised status: 1. Received B-cell depleting therapies (e.g. rituximab, obinutuzumab, ocrelizumab, alemtuzumab) within last 6 months & age > or = 63  I have spoken and communicated the following to the patient or parent/caregiver regarding COVID monoclonal antibody treatment:  1. FDA has authorized the emergency use of tixagevimab/cilgavimab for the pre-exposure prophylaxis of COVID-19 in patients with moderate-severe immunocompromised status, who meet above EUA criteria.  2. The significant known and potential risks and benefits of COVID monoclonal antibody, and the extent to which such potential risks and benefits are unknown.  3. Information on available alternative treatments and the risks and benefits of those alternatives, including clinical trials.  4. The patient or  parent/caregiver has the option to accept or refuse COVID monoclonal antibody treatment.  After reviewing this information with the patient, agree to receive tixagevimab/cilgavimab.  I called [facility] and left a message with Laverne to get the patient scheduled.    Scot Dock, NP, 05/09/2020, 12:01 PM

## 2020-05-09 NOTE — Telephone Encounter (Signed)
Gave patient information about her [facility] appointment via my chart. Her appt is 4/11 at noon.  Wilber Bihari, NP

## 2020-05-20 ENCOUNTER — Other Ambulatory Visit: Payer: Self-pay

## 2020-05-20 ENCOUNTER — Ambulatory Visit (HOSPITAL_COMMUNITY)
Admission: RE | Admit: 2020-05-20 | Discharge: 2020-05-20 | Disposition: A | Discharge status: Home / Self Care | Payer: Medicare Other | Source: Ambulatory Visit | Attending: Pulmonary Disease | Admitting: Pulmonary Disease

## 2020-05-20 DIAGNOSIS — G369 Acute disseminated demyelination, unspecified: Secondary | ICD-10-CM | POA: Diagnosis not present

## 2020-05-20 MED ORDER — METHYLPREDNISOLONE SODIUM SUCC 125 MG IJ SOLR
125.0000 mg | Freq: Once | INTRAMUSCULAR | Status: DC | PRN
Start: 2020-05-20 — End: 2020-05-21

## 2020-05-20 MED ORDER — TIXAGEVIMAB (PART OF EVUSHELD) INJECTION
300.0000 mg | Freq: Once | INTRAMUSCULAR | Status: AC
  Administered 2020-05-20: 300 mg via INTRAMUSCULAR
  Filled 2020-05-20: qty 3

## 2020-05-20 MED ORDER — FAMOTIDINE IN NACL 20-0.9 MG/50ML-% IV SOLN: 20.0000 mg | Freq: Once | INTRAVENOUS | Status: DC | PRN

## 2020-05-20 MED ORDER — DIPHENHYDRAMINE HCL 50 MG/ML IJ SOLN: 50.0000 mg | Freq: Once | INTRAMUSCULAR | Status: DC | PRN

## 2020-05-20 MED ORDER — EPINEPHRINE 0.3 MG/0.3ML IJ SOAJ: 0.3000 mg | Freq: Once | INTRAMUSCULAR | Status: DC | PRN

## 2020-05-20 MED ORDER — CILGAVIMAB (PART OF EVUSHELD) INJECTION
300.0000 mg | Freq: Once | INTRAMUSCULAR | Status: AC
  Administered 2020-05-20: 300 mg via INTRAMUSCULAR
  Filled 2020-05-20: qty 3

## 2020-05-20 MED ORDER — ALBUTEROL SULFATE HFA 108 (90 BASE) MCG/ACT IN AERS
2.0000 | INHALATION_SPRAY | Freq: Once | RESPIRATORY_TRACT | Status: DC | PRN
  Filled 2020-05-20: qty 6.7

## 2020-07-08 ENCOUNTER — Other Ambulatory Visit: Payer: Self-pay | Admitting: Neurology

## 2020-07-16 NOTE — Telephone Encounter (Signed)
Spoke w/ Liane in infusion suite. She states her last Rituxan infusion was on  06-17-20 and 07-02-20

## 2020-07-16 NOTE — Telephone Encounter (Signed)
Called Connie Haynes. Advised Dr. Felecia Shelling would like to work her in for appt tomorrow at 2pm, check in 130pm. Connie Haynes accepted. I scheduled appt. Reminded her to bring insurance cards, updated med list and to wear mask to appt.

## 2020-07-17 ENCOUNTER — Telehealth: Payer: Self-pay | Admitting: Neurology

## 2020-07-17 ENCOUNTER — Ambulatory Visit (INDEPENDENT_AMBULATORY_CARE_PROVIDER_SITE_OTHER): Payer: Medicare Other | Admitting: Neurology

## 2020-07-17 ENCOUNTER — Encounter: Payer: Self-pay | Admitting: Neurology

## 2020-07-17 VITALS — BP 175/82 | HR 65 | Ht 65.0 in | Wt 283.0 lb

## 2020-07-17 DIAGNOSIS — R339 Retention of urine, unspecified: Secondary | ICD-10-CM | POA: Diagnosis not present

## 2020-07-17 DIAGNOSIS — G959 Disease of spinal cord, unspecified: Secondary | ICD-10-CM

## 2020-07-17 DIAGNOSIS — G369 Acute disseminated demyelination, unspecified: Secondary | ICD-10-CM

## 2020-07-17 DIAGNOSIS — Z79899 Other long term (current) drug therapy: Secondary | ICD-10-CM

## 2020-07-17 DIAGNOSIS — Z298 Encounter for other specified prophylactic measures: Secondary | ICD-10-CM

## 2020-07-17 DIAGNOSIS — R200 Anesthesia of skin: Secondary | ICD-10-CM

## 2020-07-17 DIAGNOSIS — R269 Unspecified abnormalities of gait and mobility: Secondary | ICD-10-CM

## 2020-07-17 NOTE — Telephone Encounter (Signed)
MRI cervical spine (32440) & MRI thoracic spine (10272) orders sent to [place], requesting to be scheduled within 3-5 days. UHC Medicare does not require auth. Patient has BCBS as secondary ins. BCBS approval- [id number] (07/17/20- 08/15/20).

## 2020-07-17 NOTE — Progress Notes (Signed)
[facility]  PATIENT: Connie Haynes DOB: 1955/07/20  REFERRING DOCTOR OR PCP: Elza Rafter, MD SOURCE: Patient, notes from recent hospitalization, imaging and laboratory reports, MRI images personally reviewed.  _________________________________   HISTORICAL  CHIEF COMPLAINT:  Chief Complaint  Patient presents with  . Follow-up    RM 12, alone. Ambulated w/ cane. Having new numbness/weakness. On Rituxan for neuromyelitis. Last infusion: 06-17-20 and 07-02-20    HISTORY OF PRESENT ILLNESS:  I had the pleasure of seeing your patient, Connie Haynes, at the [place] center at [facility] for neurologic consultation regarding her transverse myelitis and abnormal brain MRI.  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    She tolerated the Rituxan ok (some itching, especially with the first dose).    She had her last Rituxan 05/22/2020.   Her LETM and ON occurred within a few days.  The vision change noticed shortly after presenting to the hospital for her leg/groin numbness,    Since March 2022, she has had episodes of leg weakness and numbness.    She has chronic lack of sensation below her knee but had numbness up to the left hip over the last few days.   She also feels weaker and has urinary retention.   She needs to hold the wall when she takes a shower.     She sometimes leans against the wall.     She has a left altitudinal visual field cut (confirmed on recent [facility] VF testing with ophth).  She feels that this is stable   She will feel some pressure when the bladder is full but not every time.   Similar with bowel.   Scheduled urination and defecation may help.   Strength is fine.      She gets daily HA, increased with any activity.  Also notes some left eye pain.   She received the Covid 19 vaccination [employer]) February 1, February 22, Oct 01 2019.     LETM/NMOSD HISTORY In late June 2021, she had the onset of  urinary retention and the next morning had numbness from the waist down.   She initially presented to the ED but left due to the long wait.   She re-presented to [facility] emergency room when she had worsening with more intense numbness, weakness and reduced gait.  MRIs showed a longitudinal enhancing lesion in the lower cervical and upper thoracic spine (C4-T4) and the brain showed 2 foci, one that enhanced near the basal ganglia on the right.    She was admitted for further evaluation and treatment.  She had high dose steroids for 5 days but did not note a benefit.  She then had 5 treatments of plasma exchange..    Strength improved 08/22/2019 and she was able to take a few steps.   She was doing some physical therapy while in the hospital.  By the time of discharge, she was able to use a walker.  She improved further over the next month but continued to have numbness and reduced gait.      We received the results of the [facility] anti-NMO and anti-MOG test.  Both were negative.  MRIs showed a longitudinal enhancing lesion in the lower cervical and upper thoracic spine (C4-T4) and the brain showed 2 foci, one that enhanced near the basal ganglia on the right.  We had discussed that, despite that result, I am very concerned that she is at an  elevated risk of a relapse.  Besides the inflammatory transverse myelitis, she also had an enhancing lesion in the brain.  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  Other tests including vasculitis labs were negative though CNS vasculitis would not always show up in blood work.  She has adrenal insufficiency and is on Cortef.  She also has stage 2 CKD, HTN, WPW (S/p ablation).  I personally reviewed the MRIs of the lumbar spine, thoracic spine, cervical spine and brain performed 08/10/2019 and 08/11/2019.  She had increased numbness in legs 07/2020.     Imaging review: MRI brain 08/11/2019: There is a 9 mm enhancing focus in the inferior right frontal lobe  associated with some mass-effect.  Additionally, there is a small left T2/flair hyperintense focus just above the left internal capsule.  The etiology of the focus lesion is uncertain.  It could represent a focus of demyelination.  Neoplasm cannot be completely ruled out.  The more chronic appearing focus could be demyelinating or ischemic  MRI of the cervical spine and thoracic spine 08/10/2019: Central to posterior enhancing focus within the spinal cord from C4-T4.  This causes some enlargement of the cord.  Additionally there is ACDF at C5-C7 and DJD at C3-C4 and C4-C5.  MRI of the lumbar spine 08/10/2019: There is anterolisthesis of L3 upon L4 associated with facet hypertrophy and protrusion of the uncovered disc.  There is foraminal narrowing, more on the right.  Milder degenerative changes are noted at some of the lumbar levels but no spinal stenosis or significant nerve root compression.  Laboratory review While in the hospital 08/2019, she had a LP (CSF was normal except slightly elevated protein); NMO Ab (negative), anti-MOG (negative).  Additionally she had ANCA, HIV, TB, B12, rheumatoid factor, anti-Jo 1, ENA, HSV, ACE.  These were all negative or normal.    July 2021:  Hep B cAb, Hep B sAg, Hep B sAb all negative.    ANA was negative  [facility] anti-Mog and anti-NMO were negative 09/2019   REVIEW OF SYSTEMS: Constitutional: No fevers, chills, sweats, or change in appetite Eyes: No visual changes, double vision, eye pain Ear, nose and throat: No hearing loss, ear pain, nasal congestion, sore throat Cardiovascular: No chest pain, palpitations Respiratory: No shortness of breath at rest or with exertion.   No wheezes GastrointestinaI: No nausea, vomiting, diarrhea, abdominal pain, fecal incontinence Genitourinary: No dysuria, urinary retention or frequency.  No nocturia. Musculoskeletal: No neck pain, back pain Integumentary: No rash, pruritus, skin lesions Neurological: as above Psychiatric:  No depression at this time.  No anxiety Endocrine: No palpitations, diaphoresis, change in appetite, change in weigh or increased thirst Hematologic/Lymphatic: No anemia, purpura, petechiae. Allergic/Immunologic: No itchy/runny eyes, nasal congestion, recent allergic reactions, rashes  ALLERGIES: Allergies  Allergen Reactions  . Codeine Nausea And Vomiting    Can take if takes phernergan  . Hydrocodone Nausea Only  . Robaxin [Methocarbamol] Rash    HOME MEDICATIONS:  Current Outpatient Medications:  .  AMITIZA 24 MCG capsule, Take 24 mcg by mouth 2 (two) times daily. , Disp: , Rfl:  .  carvedilol (COREG) 12.5 MG tablet, Take 12.5 mg by mouth 2 (two) times daily. , Disp: , Rfl:  .  citalopram (CELEXA) 20 MG tablet, Take 20 mg by mouth at bedtime., Disp: , Rfl:  .  hydrocortisone (CORTEF) 10 MG tablet, Take 5-10 mg by mouth See admin instructions. Take 10mg  in the AM and 5mg  in the afternoon., Disp: ,  Rfl:  .  LORazepam (ATIVAN) 0.5 MG tablet, Take 1 tablet (0.5 mg total) by mouth every 8 (eight) hours., Disp: 30 tablet, Rfl: 5 .  Multiple Vitamin (MULTIVITAMIN ADULT PO), Take 1 tablet by mouth daily., Disp: , Rfl:  .  pramipexole (MIRAPEX) 1 MG tablet, TAKE 1 & 1/2  TABLETS BY MOUTH AT BEDTIME, Disp: 45 tablet, Rfl: 0 .  predniSONE (DELTASONE) 20 MG tablet, Take 3 pills po the day before infusions., Disp: 12 tablet, Rfl: 0 .  promethazine (PHENERGAN) 25 MG tablet, Take 1 tablet (25 mg total) by mouth every 6 (six) hours as needed for nausea or vomiting. (Patient taking differently: Take 25 mg by mouth at bedtime.), Disp: 60 tablet, Rfl: 0 .  riTUXimab (RITUXAN IV), Inject into the vein., Disp: , Rfl:  .  telmisartan-hydrochlorothiazide (MICARDIS HCT) 40-12.5 MG tablet, Take 1 tablet by mouth every morning., Disp: , Rfl:  .  tiZANidine (ZANAFLEX) 4 MG tablet, Take 1 to 1 1/2 tablets by mouth 3 times a day daily as needed., Disp: 270 tablet, Rfl: 3 No current facility-administered  medications for this visit.  Facility-Administered Medications Ordered in Other Visits:  .  chlorhexidine (HIBICLENS) 4 % liquid 4 application, 60 mL, Topical, Once, Dixon, Robb Matar, PA-C .  technetium tetrofosmin (TC-MYOVIEW) injection 31.7 milli Curie, 24.5 millicurie, Intravenous, Once PRN, Dorothy Spark, MD  PAST MEDICAL HISTORY: Past Medical History:  Diagnosis Date  . Adrenal insufficiency ([place])   . Anemia    denies  . Anxiety   . Basal cell carcinoma    eye lid  . Chronic kidney disease, stage 3 ([place])   . Cough    hx. dry cough chronic  . Disorder of tear duct system    frequent tearing of eyes  . GERD (gastroesophageal reflux disease)   . H/O: whooping cough 2000  . History of kidney infection 2015   ? sepsis  . History of kidney stones   . History of kidney stones   . Hyperlipidemia   . Hypertension   . IBS (irritable bowel syndrome)    controlled with med  . Osteoarthritis    arthritis -joints-limited ROM shoulders more on right  . PONV (postoperative nausea and vomiting)     per pt hx of low BP following shoulder surgery in 05-2015; was managed in hospital with IV fluids and d/c BP being taken a tthe time; pt was dced and F/U with endcrinoopgist who dx with adrenal insurffiency and started steroid therapy; treatmetnt with positive outome, now managed chronically on hydrocortisone tablets;  Marland Kitchen Sleep apnea    no longer using cpap  . 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 UTI (lower urinary tract infection)   . Wolff-Parkinson-White (WPW) syndrome 1990s   had ablation done  and now managed on carvedilol     PAST SURGICAL HISTORY: Past Surgical History:  Procedure Laterality Date  . ablation surgery     1993 [facility] (Dr. Hulen Skains)  .  APPENDECTOMY    . CARDIAC ELECTROPHYSIOLOGY STUDY AND ABLATION     '93 for WPW syndrome  . CARPAL TUNNEL RELEASE Right 12/21/2014   Procedure: RIGHT CARPAL TUNNEL RELEASE ;  Surgeon: Netta Cedars, MD;  Location: [facility];  Service: Orthopedics;  Laterality: Right;  .  CARPAL TUNNEL RELEASE Left   . CERVICAL SPINE SURGERY     cervical fusion with hardware retained  . COLON SURGERY     "colon detached from abdomen"  . COLONOSCOPY WITH PROPOFOL N/A 05/10/2014   Procedure: COLONOSCOPY WITH PROPOFOL;  Surgeon: Juanita Craver, MD;  Location: [facility];  Service: Endoscopy;  Laterality: N/A;  . CYSTOSCOPY/RETROGRADE/URETEROSCOPY Left 12/18/2013   Procedure: CYSTOSCOPY/RETROGRADE/LEFT STENT;  Surgeon: Malka So, MD;  Location: [facility];  Service: Urology;  Laterality: Left;  . ESOPHAGOGASTRODUODENOSCOPY (EGD) WITH PROPOFOL N/A 05/10/2014   Procedure: ESOPHAGOGASTRODUODENOSCOPY (EGD) WITH PROPOFOL;  Surgeon: Juanita Craver, MD;  [facility];  Service: Endoscopy;  Laterality: N/A;  . EYELID CARCINOMA EXCISION    . INSERTION OF DIALYSIS CATHETER N/A 08/16/2019   Procedure: INSERTION OF DIALYSIS CATHETER;  Surgeon: Garner Nash, DO;  Location: [facility];  Service: Pulmonary;  Laterality: N/A;  . JOINT REPLACEMENT    . KNEE ARTHROSCOPY Left 07/16/2016  . REVERSE SHOULDER ARTHROPLASTY Right 06/29/2014   Procedure: REVERSE TOTAL SHOULDER ARTHROPLASTY;  Surgeon: Netta Cedars, MD;  Location: [place];  Service: Orthopedics;  Laterality: Right;  . REVISION TOTAL SHOULDER TO REVERSE TOTAL SHOULDER Left 05/15/2015  . REVISION TOTAL SHOULDER TO REVERSE TOTAL SHOULDER Left 05/15/2015   Procedure: LEFT SHOULDER REVISION TOTAL SHOULDER ARTHRPLASTY TO REVERSE TOTAL SHOULDER ARTHROPLASTY ;  Surgeon: Netta Cedars, MD;  Location: [place];  Service: Orthopedics;  Laterality: Left;  . tear duct surgery    . TOTAL ABDOMINAL HYSTERECTOMY    . TOTAL HIP ARTHROPLASTY     x 3(rt x2, lt x1)  . TOTAL KNEE ARTHROPLASTY Right  08/08/2013   Procedure: RIGHT TOTAL KNEE ARTHROPLASTY WITH SAPHENOUS NERVE SCAR EXCISION;  Surgeon: Mauri Pole, MD;  [facility];  Service: Orthopedics;  Laterality: Right;  . TOTAL KNEE ARTHROPLASTY Left 08/25/2016   Procedure: LEFT TOTAL KNEE ARTHROPLASTY;  Surgeon: Paralee Cancel, MD;  [facility];  Service: Orthopedics;  Laterality: Left;  70 mins; Adductor block  . TOTAL SHOULDER ARTHROPLASTY Left 07/15/2012   Procedure: LEFT TOTAL SHOULDER ARTHROPLASTY;  Surgeon: Augustin Schooling, MD;  Location: [place];  Service: Orthopedics;  Laterality: Left;  . TOTAL SHOULDER REVISION Right 12/21/2014   Procedure: RIGHT SHOULDER POLY EXCHANGE ;  Surgeon: Netta Cedars, MD;  Location: [facility];  Service: Orthopedics;  Laterality: Right;  . URETEROSCOPY  stent placement    FAMILY HISTORY: Family History  Problem Relation Age of Onset  . Leukemia Brother   . Heart disease Mother   . Hypertension Mother   . Hyperlipidemia Mother   . Heart attack Mother   . Heart failure Mother   . Heart disease Maternal Grandfather   . Heart attack Maternal Grandfather   . Cancer Paternal Grandfather   . Lung cancer Paternal Grandfather   . Cancer Maternal Uncle        bone    SOCIAL HISTORY:  Social History   Socioeconomic History  . Marital status: Married    Spouse name: Toy Baker  . Number of children: 0  . Years of education: Not on file  . Highest education level: Not on file  Occupational History  . Occupation: Retired     Fish farm manager: [employer]  Tobacco Use  . Smoking status: Never Smoker  . Smokeless tobacco: Never Used  Vaping Use  . Vaping Use: Never used  Substance and Sexual Activity  . Alcohol use: No    Alcohol/week: 0.0 standard drinks  . Drug  use: No  . Sexual activity: Not on file  Other Topics Concern  . 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   Tobacco Use/Exposure:  None   Alcohol Use: None   Drug Use:  None   Diet:  Low Fat/Low Carb   Exercise:  3-4 Days per week   Hobbies:  Reading, Travel   Left handed    Caffeine: 1 cup coffee every morning, diet coke (rare)   Social Determinants of Health   Financial Resource Strain: Not on file  Food Insecurity: Not on file  Transportation Needs: Not on file  Physical Activity: Not on file  Stress: Not on file  Social Connections: Not on file  Intimate Partner Violence: Not on file     PHYSICAL EXAM  Vitals:   07/17/20 1344  BP: (!) 175/82  Pulse: 65  SpO2: 97%  Weight: 283 lb (128.4 kg)  Height: 5\' 5"  (1.651 m)    Body mass index is 47.09 kg/m.   General: The patient is well-developed and well-nourished and in no acute distress  HEENT:  Head is [place]/AT.  Sclera are anicteric.   Neck: No carotid bruits are noted.  The neck is nontender.  Skin: Extremities are without rash or edema.  Neurologic Exam  Mental status: The patient is alert and oriented x 3 at the time of the examination. The patient has apparent normal recent and remote memory, with an apparently normal attention span and concentration ability.   Speech is normal.  Cranial nerves: Extraocular movements are full.  Color vision is normal and symmetric facial symmetry is present. There is good facial sensation to soft touch bilaterally.Facial strength is normal.  Trapezius and sternocleidomastoid strength is normal. No dysarthria is noted.   No obvious hearing deficits are noted.  Motor:  Muscle bulk is normal.   Tone is normal.  Strength is 5/5 in the arms, 3 to 4 -/5 in proximal legs, 4/5 with quadriceps and 4 -/5 in the feet and ankles  Sensory: Absent vibration sensation in right leg and 80% reduced left knee and absent left foot .  Nomral vibration and touch in hands.   Reduced sensation to temperature/touch in legs and lower flanks to chest, right worse than left      Coordination: Cerebellar testing reveals good finger-nose-finger.  She has a poor heel-to-shin.    Gait and station: She is able to stand with unilateral support.  She can walk with a cane but has a wide stance and reduced stride.  Romberg is positive.   Reflexes: Deep tendon reflexes are symmetric and normal bilaterally.        DIAGNOSTIC DATA (LABS, IMAGING, TESTING) - I reviewed patient records, labs, notes, testing and imaging myself where available.  Lab Results  Component Value Date   WBC 7.2 05/02/2020   HGB 12.3 05/02/2020   HCT 37.9 05/02/2020   MCV 91 05/02/2020   PLT 275 05/02/2020      Component Value Date/Time   NA 137 08/24/2019 0417   K 3.7 08/24/2019 0417   CL 110 08/24/2019 0417   CO2 22 08/24/2019 0417   GLUCOSE 110 (H) 08/24/2019 0417   BUN 7 (L) 08/24/2019 0417   CREATININE 0.97 08/24/2019 0417   CREATININE 0.87 06/18/2015 1207   CALCIUM 8.0 (L) 08/24/2019 0417   PROT 4.7 (  L) 08/23/2019 0416   ALBUMIN 3.6 08/23/2019 0416   ALBUMIN 4.0 08/10/2019 2356   AST 14 (L) 08/23/2019 0416   ALT 12 08/23/2019 0416   ALKPHOS 20 (L) 08/23/2019 0416   BILITOT 0.6 08/23/2019 0416   GFRNONAA >60 08/24/2019 0417   GFRAA >60 08/24/2019 0417   Lab Results  Component Value Date   CHOL 79 08/17/2019   HDL 22 (L) 08/17/2019   LDLCALC 29 08/17/2019   TRIG 141 08/17/2019   CHOLHDL 3.6 08/17/2019   No results found for: HGBA1C Lab Results  Component Value Date   VITAMINB12 376 08/11/2019   Lab Results  Component Value Date   TSH 3.119 08/11/2019       ASSESSMENT AND PLAN  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HCC)  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HCC) - Plan: MR CERVICAL SPINE W WO CONTRAST, MR THORACIC SPINE W WO CONTRAST, Urinalysis, Routine w reflex microscopic, Culture, Urine  Urinary retention - Plan: Urinalysis, Routine w reflex microscopic, Culture, Urine  Encounter for immunotherapy  Gait disturbance  Numbness  High risk medication use   1.   Uncertain if she is  having an exacerbation or not.  She has clearly worsening symptoms compared to baseline but they are similar to symptoms she experienced in the past.  I will also want to rule out a urinary tract infection that might be causing increased symptoms.  We will do IV Solu-Medrol today and tomorrow.  She should hold the Cortef during those 2 days +1 additional day.   2,    Continue Rituxan.  We will check an MRI of the cervical and thoracic spine.  If there has been any lesion, we would need to switch from Rituxan to one of the other medications.  Double seronegative NMOSD does not always respond as well to medications as anti-- NMO and I would consider repeating plasmapheresis if she does not improve or if she has breakthrough activity  3.   Continue exercises learned in PT. 4.   Return in 5-6 months or sooner if there are new or worsening neurologic symptoms.  At the next visit, we will check some lab work.  40-minute office visit with the majority of the time spent face-to-face for history and physical, discussion/counseling and decision-making.  Additional time with record review and documentation.   Elexa Kivi A. Felecia Shelling, MD, [facility] 06/18/7414, 3:84 PM Certified in Neurology, Clinical Neurophysiology, Sleep Medicine and Neuroimaging  [facility] [address], [place], [place] [address] [phone]

## 2020-07-18 LAB — URINALYSIS, ROUTINE W REFLEX MICROSCOPIC
Bilirubin, UA: NEGATIVE
Glucose, UA: NEGATIVE
Ketones, UA: NEGATIVE
Nitrite, UA: NEGATIVE
Protein,UA: NEGATIVE
RBC, UA: NEGATIVE
Specific Gravity, UA: 1.016 (ref 1.005–1.030)
Urobilinogen, Ur: 0.2 mg/dL (ref 0.2–1.0)
pH, UA: 5.5 (ref 5.0–7.5)

## 2020-07-18 LAB — MICROSCOPIC EXAMINATION
Bacteria, UA: NONE SEEN
Casts: NONE SEEN /lpf
RBC, Urine: NONE SEEN /hpf (ref 0–2)

## 2020-07-20 LAB — URINE CULTURE

## 2020-07-23 ENCOUNTER — Ambulatory Visit
Admission: RE | Admit: 2020-07-23 | Discharge: 2020-07-23 | Disposition: A | Discharge status: Home / Self Care | Payer: Medicare Other | Source: Ambulatory Visit | Attending: Neurology | Admitting: Neurology

## 2020-07-23 ENCOUNTER — Other Ambulatory Visit: Payer: Self-pay

## 2020-07-23 DIAGNOSIS — G959 Disease of spinal cord, unspecified: Secondary | ICD-10-CM | POA: Diagnosis not present

## 2020-07-23 MED ORDER — GADOBENATE DIMEGLUMINE 529 MG/ML IV SOLN
20.0000 mL | Freq: Once | INTRAVENOUS | Status: AC | PRN
  Administered 2020-07-23: 20 mL via INTRAVENOUS

## 2020-08-09 ENCOUNTER — Other Ambulatory Visit: Payer: Self-pay | Admitting: Neurology

## 2020-09-19 DIAGNOSIS — E876 Hypokalemia: Secondary | ICD-10-CM | POA: Insufficient documentation

## 2020-09-26 ENCOUNTER — Other Ambulatory Visit: Payer: Self-pay

## 2020-09-26 ENCOUNTER — Ambulatory Visit (INDEPENDENT_AMBULATORY_CARE_PROVIDER_SITE_OTHER): Payer: Medicare Other

## 2020-09-26 ENCOUNTER — Ambulatory Visit (INDEPENDENT_AMBULATORY_CARE_PROVIDER_SITE_OTHER): Payer: Medicare Other | Admitting: Internal Medicine

## 2020-09-26 VITALS — BP 138/80 | HR 65 | Ht 65.0 in | Wt 285.0 lb

## 2020-09-26 DIAGNOSIS — R609 Edema, unspecified: Secondary | ICD-10-CM

## 2020-09-26 DIAGNOSIS — I456 Pre-excitation syndrome: Secondary | ICD-10-CM

## 2020-09-26 NOTE — Patient Instructions (Signed)
Medication Instructions:   ** Increase your Furosemide to '80mg'$  x 3 days  *If you need a refill on your cardiac medications before your next appointment, please call your pharmacy*   Lab Work: None ordered.  If you have labs (blood work) drawn today and your tests are completely normal, you will receive your results only by: [place] (if you have MyChart) OR A paper copy in the mail If you have any lab test that is abnormal or we need to change your treatment, we will call you to review the results.   Testing/Procedures:  Your physician has requested that you have a lower or upper extremity venous duplex. This test is an ultrasound of the veins in the legs or arms. It looks at venous blood flow that carries blood from the heart to the legs or arms. Allow one hour for a Lower Venous exam. Allow thirty minutes for an Upper Venous exam. There are no restrictions or special instructions.   ZIO XT- Long Term Monitor Instructions  Your physician has requested you wear a ZIO patch monitor for 14 days.  This is a single patch monitor. [employer] supplies one patch monitor per enrollment. Additional stickers are not available. Please do not apply patch if you will be having a Nuclear Stress Test,  Echocardiogram, Cardiac CT, MRI, or Chest Xray during the period you would be wearing the  monitor. The patch cannot be worn during these tests. You cannot remove and re-apply the  ZIO XT patch monitor.  Your ZIO patch monitor will be mailed 3 day USPS to your address on file. It may take 3-5 days  to receive your monitor after you have been enrolled.  Once you have received your monitor, please review the enclosed instructions. Your monitor  has already been registered assigning a specific monitor serial # to you.  Billing and Patient Assistance Program Information  We have supplied [employer] with any of your insurance information on file for billing purposes. [employer] offers a sliding scale  Patient Assistance Program for patients that do not have  insurance, or whose insurance does not completely cover the cost of the ZIO monitor.  You must apply for the Patient Assistance Program to qualify for this discounted rate.  To apply, please call [employer] at [phone], select option 4, select option 2, ask to apply for  Patient Assistance Program. Theodore Demark will ask your household income, and how many people  are in your household. They will quote your out-of-pocket cost based on that information.  [employer] will also be able to set up a 16-month interest-free payment plan if needed.  Applying the monitor   Shave hair from upper left chest.  Hold abrader disc by orange tab. Rub abrader in 40 strokes over the upper left chest as  indicated in your monitor instructions.  Clean area with 4 enclosed alcohol pads. Let dry.  Apply patch as indicated in monitor instructions. Patch will be placed under collarbone on left  side of chest with arrow pointing upward.  Rub patch adhesive wings for 2 minutes. Remove white label marked "1". Remove the white  label marked "2". Rub patch adhesive wings for 2 additional minutes.  While looking in a mirror, press and release button in center of patch. A small green light will  flash 3-4 times. This will be your only indicator that the monitor has been turned on.  Do not shower for the first 24 hours. You may shower after the first 24 hours.  Press the button if you feel a symptom. You will hear a small click. Record Date, Time and  Symptom in the Patient Logbook.  When you are ready to remove the patch, follow instructions on the last 2 pages of Patient  Logbook. Stick patch monitor onto the last page of Patient Logbook.  Place Patient Logbook in the blue and white box. Use locking tab on box and tape box closed  securely. The blue and white box has prepaid postage on it. Please place it in the mailbox as  soon as possible. Your physician should have  your test results approximately 7 days after the  monitor has been mailed back to [facility].  Call [facility] at [phone] if you have questions regarding  your ZIO XT patch monitor. Call them immediately if you see an orange light blinking on your  monitor.  If your monitor falls off in less than 4 days, contact our Monitor department at [phone].  If your monitor becomes loose or falls off after 4 days call [employer] at [phone] for  suggestions on securing your monitor  Your physician has requested that you have a lower or upper extremity venous duplex. This test is an ultrasound of the veins in the legs or arms. It looks at venous blood flow that carries blood from the heart to the legs or arms. Allow one hour for a Lower Venous exam. Allow thirty minutes for an Upper Venous exam. There are no restrictions or special instructions.     Follow-Up: At [facility], you and your health needs are our priority.  As part of our continuing mission to provide you with exceptional heart care, we have created designated Provider Care Teams.  These Care Teams include your primary Cardiologist (physician) and Advanced Practice Providers (APPs -  Physician Assistants and Nurse Practitioners) who all work together to provide you with the care you need, when you need it.  We recommend signing up for the patient portal called "MyChart".  Sign up information is provided on this After Visit Summary.  MyChart is used to connect with patients for Virtual Visits (Telemedicine).  Patients are able to view lab/test results, encounter notes, upcoming appointments, etc.  Non-urgent messages can be sent to your provider as well.   To learn more about what you can do with MyChart, go to [email or web address].    Your next appointment:   6 week(s)  The format for your next appointment:   In Person  Provider:   You will see one of the following Advanced Practice Providers on  your designated Care Team:   Tommye Standard, [place] "[facility]" [place], [place]

## 2020-09-26 NOTE — Progress Notes (Signed)
ELECTROPHYSIOLOGY OFFICE NOTE  Patient ID: Connie Haynes, MRN: [id number], DOB/AGE: October 23, 1955 65 y.o. Admit date: (Not on file) Date of Consult: 09/26/2020  Primary Physician: [name], MD Primary Cardiologist: new       Connie Haynes is a 65 y.o. female who is being seen today for the evaluation of chest pain and palpitations at the request of herself.    HPI Connie Haynes is a 65 y.o. female presents with 2 separate issues.  The first is that she has had, with a prior history of bilateral asymmetric lower extremity swelling now has a 8-monthhistory of asymmetric left leg swelling.  No pain.  No history of venous clots.  She has been put on low-dose diuretics without improvement.  She also has over similar period of time complaints of chest heaviness.  These episodes typically last 30 to 60 minutes are associated with radiation to her back but not associate with diaphoresis or lightheadedness.  They are invariably associated with skipped beats which she describes as one-time loss of beats every 3-4 beats.  These events occur 3-4 times per week.  They are not aggravated by caffeine.  Her life has been full stress.  This relates to intercurrent diagnosis of transverse myelitis.  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  This all occurs in the context of Addison's disease for which she has been on chronic steroids.  Past neurological evaluation was also suggested prior stroke.    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    Chest CT LN >> which she is not aware of a diagnosis of sarcoidosis    Date Cr K Hgb  8/22 0.9 3.2 12.2            Past Medical History:  Diagnosis Date   Adrenal insufficiency (HCC)    Anemia    denies   Anxiety    Basal cell carcinoma    eye lid   Chronic kidney disease, stage 3 (HCC)    Cough    hx. dry cough chronic   Disorder of tear duct system    frequent tearing of  eyes   GERD (gastroesophageal reflux disease)    H/O: whooping cough 2000   History of kidney infection 2015   ? sepsis   History of kidney stones    History of kidney stones    Hyperlipidemia    Hypertension    IBS (irritable bowel syndrome)    controlled with med   Osteoarthritis    arthritis -joints-limited ROM shoulders more on right   PONV (postoperative nausea and vomiting)     per pt hx of low BP following shoulder surgery in 05-2015; was managed in hospital with IV fluids and d/c BP being taken a tthe time; pt was dced and F/U with endcrinoopgist who dx with adrenal insurffiency and started steroid therapy; treatmetnt with positive outome, now managed chronically on hydrocortisone tablets;   Sleep apnea    no longer using cpap   Stroke ([facility]    per patient "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 Hulen Skains)   [place]     '93 for WPW syndrome   CARPAL TUNNEL RELEASE Right 12/21/2014   Procedure: RIGHT CARPAL TUNNEL RELEASE ;  Surgeon: Netta Cedars, MD;  Location: [place];  Service: Orthopedics;  Laterality: Right;   CARPAL TUNNEL RELEASE Left    CERVICAL SPINE SURGERY     cervical fusion with hardware retained   COLON SURGERY     "colon detached from abdomen"   COLONOSCOPY WITH PROPOFOL N/A 05/10/2014   Procedure: COLONOSCOPY WITH PROPOFOL;  Surgeon: Juanita Craver, MD;  Location: [facility];  Service: Endoscopy;  Laterality: N/A;   CYSTOSCOPY/RETROGRADE/URETEROSCOPY Left 12/18/2013   Procedure:  CYSTOSCOPY/RETROGRADE/LEFT STENT;  Surgeon: Malka So, MD;  Location: [facility];  Service: Urology;  Laterality: Left;   ESOPHAGOGASTRODUODENOSCOPY (EGD) WITH PROPOFOL N/A 05/10/2014   Procedure: ESOPHAGOGASTRODUODENOSCOPY (EGD) WITH PROPOFOL;  Surgeon: Juanita Craver, MD;  [facility];  Service: Endoscopy;  Laterality: N/A;   EYELID CARCINOMA EXCISION     INSERTION OF DIALYSIS CATHETER N/A 08/16/2019   Procedure: INSERTION OF DIALYSIS CATHETER;  Surgeon: Garner Nash, DO;  Location: [place];  Service: Pulmonary;  Laterality: N/A;   JOINT REPLACEMENT     KNEE ARTHROSCOPY Left 07/16/2016   REVERSE SHOULDER ARTHROPLASTY Right 06/29/2014   Procedure: REVERSE TOTAL SHOULDER ARTHROPLASTY;  Surgeon: Netta Cedars, MD;  Location: [place];  Service: Orthopedics;  Laterality: Right;   REVISION TOTAL SHOULDER TO REVERSE TOTAL SHOULDER Left 05/15/2015   REVISION TOTAL SHOULDER TO REVERSE TOTAL SHOULDER Left 05/15/2015   Procedure: LEFT SHOULDER REVISION TOTAL SHOULDER ARTHRPLASTY TO REVERSE TOTAL SHOULDER ARTHROPLASTY ;  Surgeon: Netta Cedars, MD;  Location: [place];  Service: Orthopedics;  Laterality: Left;   tear duct surgery     TOTAL ABDOMINAL HYSTERECTOMY     TOTAL HIP ARTHROPLASTY     x 3(rt x2, lt x1)   TOTAL KNEE ARTHROPLASTY Right 08/08/2013   Procedure: RIGHT TOTAL KNEE ARTHROPLASTY WITH SAPHENOUS NERVE SCAR EXCISION;  Surgeon: Mauri Pole, MD;  [facility];  Service: Orthopedics;  Laterality: Right;   TOTAL KNEE ARTHROPLASTY Left 08/25/2016   Procedure: LEFT TOTAL KNEE ARTHROPLASTY;  Surgeon: Paralee Cancel, MD;  [facility];  Service: Orthopedics;  Laterality: Left;  70 mins; Adductor block   TOTAL SHOULDER ARTHROPLASTY Left 07/15/2012   Procedure: LEFT TOTAL SHOULDER ARTHROPLASTY;  Surgeon: Augustin Schooling, MD;  Location: [place];  Service: Orthopedics;  Laterality: Left;   TOTAL SHOULDER REVISION Right 12/21/2014   Procedure: RIGHT SHOULDER POLY EXCHANGE ;  Surgeon: Netta Cedars, MD;  Location: [place];  Service: Orthopedics;  Laterality: Right;   URETEROSCOPY  stent placement     Home Meds: Current Meds  Medication Sig   AMITIZA 24 MCG capsule Take 24 mcg by mouth 2 (two) times daily.    carvedilol (COREG) 12.5 MG tablet Take 12.5 mg by mouth 2 (two) times daily.    citalopram (CELEXA) 20 MG tablet Take 20 mg by mouth at bedtime.   furosemide (LASIX) 20 MG tablet Take 2 tablets by mouth daily.   hydrocortisone (CORTEF) 10 MG tablet Take 5-10 mg by mouth See admin instructions. Take '10mg'$  in the AM and '5mg'$  in the afternoon.   LORazepam (ATIVAN) 0.5 MG tablet Take 1 tablet (0.5 mg total) by mouth every 8 (eight) hours.  Multiple Vitamin (MULTIVITAMIN ADULT PO) Take 1 tablet by mouth daily.   pramipexole (MIRAPEX) 1 MG tablet TAKE 1 & 1/2 (ONE & ONE-HALF) TABLETS BY MOUTH AT BEDTIME   predniSONE (DELTASONE) 20 MG tablet Take 3 pills po the day before infusions.   promethazine (PHENERGAN) 25 MG tablet Take 1 tablet (25 mg total) by mouth every 6 (six) hours as needed for nausea or vomiting.   riTUXimab (RITUXAN IV) Inject into the vein.   telmisartan-hydrochlorothiazide (MICARDIS HCT) 40-12.5 MG tablet Take 1 tablet by mouth every morning.   tiZANidine (ZANAFLEX) 4 MG tablet Take 1 to 1 1/2 tablets by mouth 3 times a day daily as needed.    Allergies:  Allergies  Allergen Reactions   Codeine Nausea And Vomiting    Can take if takes phernergan   Hydrocodone Nausea Only   Robaxin [Methocarbamol] Rash    Social History   Socioeconomic History   Marital status: Married    Spouse name: Public house manager   Number of children: 0   Years of education: Not on file   Highest education level: Not on file  Occupational History   Occupation: Retired     Fish farm manager: [employer]  Tobacco Use   Smoking status: Never   Smokeless tobacco: Never  Vaping Use   Vaping Use: Never used  Substance and Sexual Activity   Alcohol use: No    Alcohol/week: 0.0 standard  drinks   Drug use: No   Sexual activity: Not on file  Other Topics Concern   Not on file  Social History Narrative   Marital Status:  Married Lobbyist)   Living Situation: Lives with spouse   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   Tobacco Use/Exposure:  None   Alcohol Use: None   Drug Use:  None   Diet:  Low Fat/Low Carb   Exercise:  3-4 Days per week   Hobbies:  Reading, Travel   Left handed    Caffeine: 1 cup coffee every morning, diet coke (rare)   Social Determinants of Health   Financial Resource Strain: Not on file  Food Insecurity: Not on file  Transportation Needs: Not on file  Physical Activity: Not on file  Stress: Not on file  Social Connections: Not on file  Intimate Partner Violence: Not on file     Family History  Problem Relation Age of Onset   Leukemia Brother    Heart disease Mother    Hypertension Mother    Hyperlipidemia Mother    Heart attack Mother    Heart failure Mother    Heart disease Maternal Grandfather    Heart attack Maternal Grandfather    Cancer Paternal Grandfather    Lung cancer Paternal Grandfather    Cancer Maternal Uncle        bone     ROS:  Please see the history of present illness.     All other systems reviewed and negative.    Physical Exam:  Blood pressure 138/80, pulse 65, height '5\' 5"'$  (1.651 m), weight 285 lb (129.3 kg), SpO2 94 %. General: Well developed, well nourished female in no acute distress. Head: Normocephalic, atraumatic, sclera non-icteric, no xanthomas, nares are without discharge. EENT: normal  Lymph Nodes:  none Neck: Negative for carotid bruits. JVD 8-. Back:without scoliosis kyphosis Lungs: Clear bilaterally to auscultation without wheezes, rales, or rhonchi. Breathing is unlabored. Heart: RRR with S1 S2. N murmur . No rubs, or gallops appreciated. Abdomen: Soft,  non-tender, non-distended with normoactive bowel sounds. No hepatomegaly. No rebound/guarding. No obvious  abdominal masses. Msk:  Strength and tone appear normal for age. Extremities: No clubbing or cyanosis. Tr L edema.  Distal pedal pulses are 2+ and equal bilaterally. Skin: Warm and Dry Neuro: Alert and oriented X 3. CN III-XII intact Grossly normal sensory and motor function . Psych:  Responds to questions appropriately with a normal affect.      Labs: Cardiac Enzymes No results for input(s): CKTOTAL, CKMB, TROPONINI in the last 72 hours. CBC Lab Results  Component Value Date   WBC 7.2 05/02/2020   HGB 12.3 05/02/2020   HCT 37.9 05/02/2020   MCV 91 05/02/2020   PLT 275 05/02/2020   PROTIME: No results for input(s): LABPROT, INR in the last 72 hours. Chemistry No results for input(s): NA, K, CL, CO2, BUN, CREATININE, CALCIUM, PROT, BILITOT, ALKPHOS, ALT, AST, GLUCOSE in the last 168 hours.  Invalid input(s): LABALBU Lipids Lab Results  Component Value Date   CHOL 79 08/17/2019   HDL 22 (L) 08/17/2019   LDLCALC 29 08/17/2019   TRIG 141 08/17/2019   BNP No results found for: PROBNP Thyroid Function Tests: No results for input(s): TSH, T4TOTAL, T3FREE, THYROIDAB in the last 72 hours.  Invalid input(s): FREET3 Miscellaneous No results found for: DDIMER  Radiology/Studies:  No results found.  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  We will plan to use an event recorder to try to clarify the mechanism.  In the event that PVCs are not covered, and therapeutic options can be discussed including beta-blockers, calcium blockers or other antiarrhythmic drugs.  We have to undertake this decision with Dr. Felecia Shelling from neurology given her complex neurological history.  Has asymmetric edema.  We will  increase her furosemide from 40--80 for 3 days.  We will undertake a venous Doppler to make sure there is no evidence of venous insufficiency and or obstructive venopathy      Virl Axe

## 2020-09-26 NOTE — Progress Notes (Unsigned)
Enrolled patient for a 14 day Zio XT  monitor to be mailed to patients home  °

## 2020-09-29 DIAGNOSIS — I456 Pre-excitation syndrome: Secondary | ICD-10-CM | POA: Diagnosis not present

## 2020-09-30 ENCOUNTER — Ambulatory Visit (HOSPITAL_COMMUNITY)
Admission: RE | Admit: 2020-09-30 | Discharge: 2020-09-30 | Disposition: A | Discharge status: Home / Self Care | Payer: Medicare Other | Source: Ambulatory Visit | Attending: Cardiology | Admitting: Cardiology

## 2020-09-30 ENCOUNTER — Other Ambulatory Visit: Payer: Self-pay

## 2020-09-30 DIAGNOSIS — R600 Localized edema: Secondary | ICD-10-CM

## 2020-09-30 DIAGNOSIS — R609 Edema, unspecified: Secondary | ICD-10-CM | POA: Diagnosis present

## 2020-09-30 DIAGNOSIS — I456 Pre-excitation syndrome: Secondary | ICD-10-CM

## 2020-10-30 ENCOUNTER — Encounter: Payer: Self-pay | Admitting: Neurology

## 2020-10-30 ENCOUNTER — Other Ambulatory Visit: Payer: Self-pay

## 2020-10-30 ENCOUNTER — Ambulatory Visit (INDEPENDENT_AMBULATORY_CARE_PROVIDER_SITE_OTHER): Payer: Medicare Other | Admitting: Neurology

## 2020-10-30 VITALS — BP 135/85 | HR 64 | Ht 65.0 in | Wt 288.0 lb

## 2020-10-30 DIAGNOSIS — G959 Disease of spinal cord, unspecified: Secondary | ICD-10-CM | POA: Insufficient documentation

## 2020-10-30 DIAGNOSIS — R200 Anesthesia of skin: Secondary | ICD-10-CM | POA: Diagnosis not present

## 2020-10-30 DIAGNOSIS — Z8669 Personal history of other diseases of the nervous system and sense organs: Secondary | ICD-10-CM

## 2020-10-30 DIAGNOSIS — Z298 Encounter for other specified prophylactic measures: Secondary | ICD-10-CM | POA: Diagnosis not present

## 2020-10-30 DIAGNOSIS — G369 Acute disseminated demyelination, unspecified: Secondary | ICD-10-CM | POA: Diagnosis not present

## 2020-10-30 DIAGNOSIS — R339 Retention of urine, unspecified: Secondary | ICD-10-CM

## 2020-10-30 DIAGNOSIS — R269 Unspecified abnormalities of gait and mobility: Secondary | ICD-10-CM

## 2020-10-30 MED ORDER — PREDNISONE 20 MG PO TABS: ORAL_TABLET | ORAL | 0 refills | Status: AC

## 2020-10-30 MED ORDER — TAMSULOSIN HCL 0.4 MG PO CAPS: 0.4000 mg | ORAL_CAPSULE | Freq: Every day | ORAL | 3 refills | Status: DC

## 2020-10-30 NOTE — Progress Notes (Signed)
[facility]  PATIENT: Connie Haynes DOB: 06-04-1955  REFERRING DOCTOR OR PCP: Elza Rafter, MD SOURCE: Patient, notes from recent hospitalization, imaging and laboratory reports, MRI images personally reviewed.  _________________________________   HISTORICAL  CHIEF COMPLAINT:  Chief Complaint  Patient presents with   Follow-up    Rm 1, alone. Here for neuromyelitis f/u. Pt reports having L leg numbness, lasts about 30 min, last couple months and occurring more often. Sch for infusion on Oct. Asking about flu shoot.     HISTORY OF PRESENT ILLNESS:  Connie Haynes is a 65 y.o. 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      She gets epsidoes of left leg numbness and weakness.   When that occurs, she has trouble walking.    This lasts 30 minutes to 24 hours.     Next Rituxan dose is 12/09/20.  She has tolerated it ok - fatigued x 2 days.   She felt much worse after her fitrst infusion x 1 week and also had itchig.    She is taking prednisone the day befpre the infusion now with benefit.    She tolerated the Rituxan ok (some itching, especially with the first dose).    She had her last Rituxan 05/22/2020.   Her LETM and ON occurred within a few days.  The vision change noticed shortly after presenting to the hospital for her leg/groin numbness,    She has chronic lack of sensation below her knee but had numbness up to the left hip over the last few days.   She also feels weaker.   She needs to hold the wall when she takes a shower.     She sometimes leans against the wall.   She also gets edema in the left leg.   PT has helped some.  She will be released shortly.     She has a left altitudinal visual field cut (confirmed on recent [facility] VF testing with ophth).  She feels that this is stable  Bladder issues continue.   She will feel some pressure when the bladder is full but not  every time.   Similar with bowel.   Scheduled urination and defecation may help some.  Hesitancy is a problem.     She does not think she has had UTI.     I reviewed her more recent spine MRIs in her presence and compared them to the previous ones.  The MRIs look great showing no new lesions and fairly complete resolution of the enhancing focus that had extended from the mid cervical spine to the upper thoracic spine.  She has had adrenal insufficiency dx in 2018 and is on Cortef.     She received the Covid 19 vaccination [employer]) February 1, February 22, Oct 01 2019.     LETM/NMOSD HISTORY In late June 2021, she had the onset of urinary retention and the next morning had numbness from the waist down.   She initially presented to the ED but left due to the long wait.   She re-presented to [facility] emergency room when she had worsening with more intense numbness, weakness and reduced gait.  MRIs showed a longitudinal enhancing lesion in the lower cervical and upper thoracic spine (C4-T4) and the brain showed 2 foci, one that enhanced near the basal ganglia on the right.    She was admitted for further  evaluation and treatment.  She had high dose steroids for 5 days but did not note a benefit.  She then had 5 treatments of plasma exchange..    Strength improved 08/22/2019 and she was able to take a few steps.   She was doing some physical therapy while in the hospital.  By the time of discharge, she was able to use a walker.  She improved further over the next month but continued to have numbness and reduced gait.      We received the results of the [facility] anti-NMO and anti-MOG test.  Both were negative.  MRIs showed a longitudinal enhancing lesion in the lower cervical and upper thoracic spine (C4-T4) and the brain showed 2 foci, one that enhanced near the basal ganglia on the right.  We had discussed that, despite that result, I am very concerned that she is at an elevated risk of a relapse.   Besides the inflammatory transverse myelitis, she also had an enhancing lesion in the brain.  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  Other tests including vasculitis labs were negative though CNS vasculitis would not always show up in blood work.  She has adrenal insufficiency and is on Cortef.  She also has stage 2 CKD, HTN, WPW (S/p ablation).  I personally reviewed the MRIs of the lumbar spine, thoracic spine, cervical spine and brain performed 08/10/2019 and 08/11/2019.  She had increased numbness in legs 07/2020.     Imaging review: MRI brain 08/11/2019: There is a 9 mm enhancing focus in the inferior right frontal lobe associated with some mass-effect.  Additionally, there is a small left T2/flair hyperintense focus just above the left internal capsule.  The etiology of the focus lesion is uncertain.  It could represent a focus of demyelination.  Neoplasm cannot be completely ruled out.  The more chronic appearing focus could be demyelinating or ischemic  MRI of the cervical spine and thoracic spine 08/10/2019: Central to posterior enhancing focus within the spinal cord from C4-T4.  This causes some enlargement of the cord.  Additionally there is ACDF at C5-C7 and DJD at C3-C4 and C4-C5.  MRI of the lumbar spine 08/10/2019: There is anterolisthesis of L3 upon L4 associated with facet hypertrophy and protrusion of the uncovered disc.  There is foraminal narrowing, more on the right.  Milder degenerative changes are noted at some of the lumbar levels but no spinal stenosis or significant nerve root compression.  MRI of the cervical and thoracic spine 01/10/2020 showed greatly improved appearance of the abnormal signal.  On this MRI, there is just minimal T2 hyperintensity adjacent to C6-C7 and mild abnormal enhancement posteriorly adjacent to C7.  MRI of the brain 01/10/2020 showed interval improvement of the enhancing focus in the inferior right frontal lobe that have been seen on the 08/11/2019  MRI.  There was still a small focus of enhancement in that region.  Several other T2/FLAIR hyperintense foci in the hemispheres are more consistent with minimal stable chronic microvascular ischemic change.  MRI of the cervical and thoracic spine 07/23/2020 showed complete resolution of the enhancing focus from C4-T4 that has been seen on previous MRIs.  Laboratory review While in the hospital 08/2019, she had a LP (CSF was normal except slightly elevated protein); NMO Ab (negative), anti-MOG (negative).  Additionally she had ANCA, HIV, TB, B12, rheumatoid factor, anti-Jo 1, ENA, HSV, ACE.  These were all negative or normal.    July 2021:  Hep B  cAb, Hep B sAg, Hep B sAb all negative.    ANA was negative  [facility] anti-Mog and anti-NMO were negative 09/2019   REVIEW OF SYSTEMS: Constitutional: No fevers, chills, sweats, or change in appetite Eyes: No visual changes, double vision, eye pain Ear, nose and throat: No hearing loss, ear pain, nasal congestion, sore throat Cardiovascular: No chest pain, palpitations Respiratory:  No shortness of breath at rest or with exertion.   No wheezes GastrointestinaI: No nausea, vomiting, diarrhea, abdominal pain, fecal incontinence Genitourinary:  No dysuria, urinary retention or frequency.  No nocturia. Musculoskeletal:  No neck pain, back pain Integumentary: No rash, pruritus, skin lesions Neurological: as above Psychiatric: No depression at this time.  No anxiety Endocrine: No palpitations, diaphoresis, change in appetite, change in weigh or increased thirst Hematologic/Lymphatic:  No anemia, purpura, petechiae. Allergic/Immunologic: No itchy/runny eyes, nasal congestion, recent allergic reactions, rashes  ALLERGIES: Allergies  Allergen Reactions   Codeine Nausea And Vomiting    Can take if takes phernergan   Hydrocodone Nausea Only   Robaxin [Methocarbamol] Rash    HOME MEDICATIONS:  Current Outpatient Medications:    AMITIZA 24 MCG capsule, Take  24 mcg by mouth 2 (two) times daily. , Disp: , Rfl:    carvedilol (COREG) 12.5 MG tablet, Take 12.5 mg by mouth 2 (two) times daily. , Disp: , Rfl:    citalopram (CELEXA) 20 MG tablet, Take 20 mg by mouth at bedtime., Disp: , Rfl:    furosemide (LASIX) 20 MG tablet, Take 2 tablets by mouth daily., Disp: , Rfl:    hydrocortisone (CORTEF) 10 MG tablet, Take 5-10 mg by mouth See admin instructions. Take 10mg  in the AM and 5mg  in the afternoon., Disp: , Rfl:    LORazepam (ATIVAN) 0.5 MG tablet, Take 1 tablet (0.5 mg total) by mouth every 8 (eight) hours., Disp: 30 tablet, Rfl: 5   Multiple Vitamin (MULTIVITAMIN ADULT PO), Take 1 tablet by mouth daily., Disp: , Rfl:    potassium chloride (KLOR-CON) 10 MEQ tablet, Take 10 mEq by mouth 2 (two) times daily., Disp: , Rfl:    pramipexole (MIRAPEX) 1 MG tablet, TAKE 1 & 1/2 (ONE & ONE-HALF) TABLETS BY MOUTH AT BEDTIME, Disp: 45 tablet, Rfl: 5   promethazine (PHENERGAN) 25 MG tablet, Take 1 tablet (25 mg total) by mouth every 6 (six) hours as needed for nausea or vomiting., Disp: 60 tablet, Rfl: 0   riTUXimab (RITUXAN IV), Inject into the vein., Disp: , Rfl:    tamsulosin (FLOMAX) 0.4 MG CAPS capsule, Take 1 capsule (0.4 mg total) by mouth daily., Disp: 90 capsule, Rfl: 3   telmisartan-hydrochlorothiazide (MICARDIS HCT) 40-12.5 MG tablet, Take 1 tablet by mouth every morning., Disp: , Rfl:    tiZANidine (ZANAFLEX) 4 MG tablet, Take 1 to 1 1/2 tablets by mouth 3 times a day daily as needed., Disp: 270 tablet, Rfl: 3   predniSONE (DELTASONE) 20 MG tablet, Take 3 pills po the day before infusions., Disp: 12 tablet, Rfl: 0 No current facility-administered medications for this visit.  Facility-Administered Medications Ordered in Other Visits:    chlorhexidine (HIBICLENS) 4 % liquid 4 application, 60 mL, Topical, Once, Dixon, Robb Matar, PA-C   technetium tetrofosmin (TC-MYOVIEW) injection 31.7 milli Curie, 42.8 millicurie, Intravenous, Once PRN, Dorothy Spark, MD  PAST MEDICAL HISTORY: Past Medical History:  Diagnosis Date   Adrenal insufficiency ([place])    Anemia    denies   Anxiety    Basal cell carcinoma  eye lid   Chronic kidney disease, stage 3 (HCC)    Cough    hx. dry cough chronic   Disorder of tear duct system    frequent tearing of eyes   GERD (gastroesophageal reflux disease)    H/O: whooping cough 2000   History of kidney infection 2015   ? sepsis   History of kidney stones    History of kidney stones    Hyperlipidemia    Hypertension    IBS (irritable bowel syndrome)    controlled with med   Osteoarthritis    arthritis -joints-limited ROM shoulders more on right   PONV (postoperative nausea and vomiting)     per pt hx of low BP following shoulder surgery in 05-2015; was managed in hospital with IV fluids and d/c BP being taken a tthe time; pt was dced and F/U with endcrinoopgist who dx with adrenal insurffiency and started steroid therapy; treatmetnt with positive outome, now managed chronically on hydrocortisone tablets;   Sleep apnea    no longer using cpap   Stroke [facility])    per patient "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 Hulen Skains)   [place]     '93 for WPW syndrome   CARPAL TUNNEL RELEASE Right 12/21/2014   Procedure: RIGHT CARPAL TUNNEL RELEASE ;  Surgeon: Netta Cedars, MD;  Location: [place];  Service: Orthopedics;  Laterality: Right;   CARPAL TUNNEL RELEASE Left    CERVICAL SPINE SURGERY     cervical fusion with  hardware retained   COLON SURGERY     "colon detached from abdomen"   COLONOSCOPY WITH PROPOFOL N/A 05/10/2014   Procedure: COLONOSCOPY WITH PROPOFOL;  Surgeon: Juanita Craver, MD;  Location: [facility];  Service: Endoscopy;  Laterality: N/A;   CYSTOSCOPY/RETROGRADE/URETEROSCOPY Left 12/18/2013   Procedure: CYSTOSCOPY/RETROGRADE/LEFT STENT;  Surgeon: Malka So, MD;  Location: [facility];  Service: Urology;  Laterality: Left;   ESOPHAGOGASTRODUODENOSCOPY (EGD) WITH PROPOFOL N/A 05/10/2014   Procedure: ESOPHAGOGASTRODUODENOSCOPY (EGD) WITH PROPOFOL;  Surgeon: Juanita Craver, MD;  [facility];  Service: Endoscopy;  Laterality: N/A;   EYELID CARCINOMA EXCISION     INSERTION OF DIALYSIS CATHETER N/A 08/16/2019   Procedure: INSERTION OF DIALYSIS CATHETER;  Surgeon: Garner Nash, DO;  Location: [place];  Service: Pulmonary;  Laterality: N/A;   JOINT REPLACEMENT     KNEE ARTHROSCOPY Left 07/16/2016   REVERSE SHOULDER ARTHROPLASTY Right 06/29/2014   Procedure: REVERSE TOTAL SHOULDER ARTHROPLASTY;  Surgeon: Netta Cedars, MD;  Location: [place];  Service: Orthopedics;  Laterality: Right;   REVISION TOTAL SHOULDER TO REVERSE TOTAL SHOULDER Left 05/15/2015   REVISION TOTAL SHOULDER TO REVERSE TOTAL SHOULDER Left 05/15/2015   Procedure: LEFT SHOULDER REVISION TOTAL SHOULDER ARTHRPLASTY TO REVERSE TOTAL SHOULDER ARTHROPLASTY ;  Surgeon: Netta Cedars, MD;  Location: [place];  Service: Orthopedics;  Laterality: Left;   tear duct surgery     TOTAL ABDOMINAL HYSTERECTOMY     TOTAL HIP ARTHROPLASTY     x  3(rt x2, lt x1)   TOTAL KNEE ARTHROPLASTY Right 08/08/2013   Procedure: RIGHT TOTAL KNEE ARTHROPLASTY WITH SAPHENOUS NERVE SCAR EXCISION;  Surgeon: Mauri Pole, MD;  Location[facility];  Service: Orthopedics;  Laterality: Right;   TOTAL KNEE ARTHROPLASTY Left 08/25/2016   Procedure: LEFT TOTAL KNEE ARTHROPLASTY;  Surgeon: Paralee Cancel, MD;  Location: [facility];  Service: Orthopedics;  Laterality: Left;  70  mins; Adductor block   TOTAL SHOULDER ARTHROPLASTY Left 07/15/2012   Procedure: LEFT TOTAL SHOULDER ARTHROPLASTY;  Surgeon: Augustin Schooling, MD;  Location: [place];  Service: Orthopedics;  Laterality: Left;   TOTAL SHOULDER REVISION Right 12/21/2014   Procedure: RIGHT SHOULDER POLY EXCHANGE ;  Surgeon: Netta Cedars, MD;  Location: [place];  Service: Orthopedics;  Laterality: Right;   URETEROSCOPY  stent placement    FAMILY HISTORY: Family History  Problem Relation Age of Onset   Leukemia Brother    Heart disease Mother    Hypertension Mother    Hyperlipidemia Mother    Heart attack Mother    Heart failure Mother    Heart disease Maternal Grandfather    Heart attack Maternal Grandfather    Cancer Paternal Grandfather    Lung cancer Paternal Grandfather    Cancer Maternal Uncle        bone    SOCIAL HISTORY:  Social History   Socioeconomic History   Marital status: Married    Spouse name: Public house manager   Number of children: 0   Years of education: Not on file   Highest education level: Not on file  Occupational History   Occupation: Retired     Fish farm manager: [employer]  Tobacco Use   Smoking status: Never   Smokeless tobacco: Never  Vaping Use   Vaping Use: Never used  Substance and Sexual Activity   Alcohol use: No    Alcohol/week: 0.0 standard drinks   Drug use: No   Sexual activity: Not on file  Other Topics Concern   Not on file  Social History Narrative   Marital Status:  Married Lobbyist)   Living Situation: Lives with spouse   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   Tobacco Use/Exposure:  None   Alcohol Use: None   Drug Use:  None   Diet:  Low Fat/Low Carb   Exercise:  3-4 Days per week   Hobbies:  Reading, Travel   Left handed    Caffeine: 1 cup coffee every morning, diet coke (rare)   Social Determinants of Health   Financial Resource Strain: Not on file  Food Insecurity: Not on file  Transportation Needs: Not on file   Physical Activity: Not on file  Stress: Not on file  Social Connections: Not on file  Intimate Partner Violence: Not on file     PHYSICAL EXAM  Vitals:   10/30/20 0810  BP: 135/85  Pulse: 64  Weight: 288 lb (130.6 kg)  Height: 5\' 5"  (1.651 m)    Body mass index is 47.93 kg/m.   General: The patient is well-developed and well-nourished and in no acute distress  HEENT:  Head is [place]/AT.  Sclera are anicteric.   Neck: No carotid bruits are noted.  The neck is nontender.  Skin: Extremities are without rash or edema.  Neurologic Exam  Mental status: The patient is alert and oriented x 3 at the time of the examination. The patient has apparent normal recent and remote memory, with an apparently normal attention  span and concentration ability.   Speech is normal.  Cranial nerves: Extraocular movements are full.  Color vision is normal and symmetric today.  Facial symmetry is present. There is good facial sensation to soft touch bilaterally.Facial strength is normal.  Trapezius and sternocleidomastoid strength is normal. No dysarthria is noted.   No obvious hearing deficits are noted.  Motor:  Muscle bulk is normal.   Tone is normal.  Strength is 5/5 in the arms, 3 to 4 -/5 in proximal legs, 4/5 with quadriceps and 4 -/5 in the feet and ankles  Sensory: She has absent vibration sensation in the legs   Reduced sensation to temperature/touch in legs and lower flanks to chest, left worse than right  Coordination: Cerebellar testing reveals good finger-nose-finger.  She has a poor heel-to-shin.    Gait and station: She can rise from her chair with unilateral support and stand without support.  She is walking without a cane.  The stance is wide and the stride is reduced.    Reflexes: Deep tendon reflexes are symmetric and normal bilaterally.        DIAGNOSTIC DATA (LABS, IMAGING, TESTING) - I reviewed patient records, labs, notes, testing and imaging myself where available.  Lab  Results  Component Value Date   WBC 7.2 05/02/2020   HGB 12.3 05/02/2020   HCT 37.9 05/02/2020   MCV 91 05/02/2020   PLT 275 05/02/2020      Component Value Date/Time   NA 137 08/24/2019 0417   K 3.7 08/24/2019 0417   CL 110 08/24/2019 0417   CO2 22 08/24/2019 0417   GLUCOSE 110 (H) 08/24/2019 0417   BUN 7 (L) 08/24/2019 0417   CREATININE 0.97 08/24/2019 0417   CREATININE 0.87 06/18/2015 1207   CALCIUM 8.0 (L) 08/24/2019 0417   PROT 4.7 (L) 08/23/2019 0416   ALBUMIN 3.6 08/23/2019 0416   ALBUMIN 4.0 08/10/2019 2356   AST 14 (L) 08/23/2019 0416   ALT 12 08/23/2019 0416   ALKPHOS 20 (L) 08/23/2019 0416   BILITOT 0.6 08/23/2019 0416   GFRNONAA >60 08/24/2019 0417   GFRAA >60 08/24/2019 0417   Lab Results  Component Value Date   CHOL 79 08/17/2019   HDL 22 (L) 08/17/2019   LDLCALC 29 08/17/2019   TRIG 141 08/17/2019   CHOLHDL 3.6 08/17/2019   No results found for: HGBA1C Lab Results  Component Value Date   VITAMINB12 376 08/11/2019   Lab Results  Component Value Date   TSH 3.119 08/11/2019       ASSESSMENT AND PLAN  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[place]) - Plan: Ambulatory referral to Physical Therapy, IgG, IgA, IgM  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   She has greatly improved compared to her initial presentation in 2021.  However, she continues to have intermittent episodes of left leg increased numbness with some weakness.  We discussed that these fluctuations are unlikely to be due to new demyelinating plaques.  Images were reviewed in her presence. 2,    Continue Rituxan.  IgG and IgM were mildly reduced.  We will recheck today.   3.   Change PT to neuro rehab.   4.   Tamsulosin for the bladder.   5.   Return in 5-6 months or sooner if there are new or worsening neurologic symptoms.  At the  next  visit, we will check some lab work.   45 minutes office visit with the majority of the time spent face-to-face for history and physical, discussion/counseling and decision-making.  Additional time with record review and documentation.   Hodari Chuba A. Felecia Shelling, MD, [facility] 8/88/9169, 45:03 AM Certified in Neurology, Clinical Neurophysiology, Sleep Medicine and Neuroimaging  [facility] [address], [place], [place] [address] [phone]

## 2020-10-31 LAB — IGG, IGA, IGM
IgA/Immunoglobulin A, Serum: 88 mg/dL (ref 87–352)
IgG (Immunoglobin G), Serum: 473 mg/dL — ABNORMAL LOW (ref 586–1602)
IgM (Immunoglobulin M), Srm: 12 mg/dL — ABNORMAL LOW (ref 26–217)

## 2020-11-05 NOTE — Progress Notes (Signed)
PCP:  [name], MD Primary Cardiologist: None Electrophysiologist: Virl Axe, MD   Connie Haynes is a 65 y.o. female seen today for Virl Axe, MD for routine electrophysiology followup.  Since last being seen in our clinic the patient reports doing about the same. Her edema has improved with sliding scale diuretics. She continues to have intermittent palpitations.  she denies chest pain, dyspnea, PND, orthopnea, nausea, vomiting, dizziness, syncope, edema, weight gain, or early satiety.  Past Medical History:  Diagnosis Date   Adrenal insufficiency ([place])    Anemia    denies   Anxiety    Basal cell carcinoma    eye lid   Chronic kidney disease, stage 3 (HCC)    Cough    hx. dry cough chronic   Disorder of tear duct system    frequent tearing of eyes   GERD (gastroesophageal reflux disease)    H/O: whooping cough 2000   History of kidney infection 2015   ? sepsis   History of kidney stones    History of kidney stones    Hyperlipidemia    Hypertension    IBS (irritable bowel syndrome)    controlled with med   Osteoarthritis    arthritis -joints-limited ROM shoulders more on right   PONV (postoperative nausea and vomiting)     per pt hx of low BP following shoulder surgery in 05-2015; was managed in hospital with IV fluids and d/c BP being taken a tthe time; pt was dced and F/U with endcrinoopgist who dx with adrenal insurffiency and started steroid therapy; treatmetnt with positive outome, now managed chronically on hydrocortisone tablets;   Sleep apnea    no longer using cpap   Stroke [facility])    per patient "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 Hulen Skains)   [place]     '93 for WPW syndrome   CARPAL TUNNEL RELEASE Right 12/21/2014   Procedure: RIGHT CARPAL TUNNEL RELEASE ;  Surgeon: Netta Cedars, MD;  Location: [place];  Service: Orthopedics;  Laterality: Right;   CARPAL TUNNEL RELEASE Left    CERVICAL SPINE SURGERY     cervical fusion with hardware retained   COLON SURGERY     "colon detached from abdomen"   COLONOSCOPY WITH PROPOFOL N/A 05/10/2014   Procedure: COLONOSCOPY WITH PROPOFOL;  Surgeon: Juanita Craver, MD;  [facility];  Service: Endoscopy;  Laterality: N/A;   CYSTOSCOPY/RETROGRADE/URETEROSCOPY Left 12/18/2013   Procedure: CYSTOSCOPY/RETROGRADE/LEFT STENT;  Surgeon: Malka So, MD;  [facility];  Service: Urology;  Laterality: Left;   ESOPHAGOGASTRODUODENOSCOPY (EGD) WITH PROPOFOL N/A 05/10/2014   Procedure: ESOPHAGOGASTRODUODENOSCOPY (EGD) WITH PROPOFOL;  Surgeon: Juanita Craver, MD;  [facility];  Service: Endoscopy;  Laterality: N/A;   EYELID CARCINOMA EXCISION     INSERTION OF DIALYSIS CATHETER N/A 08/16/2019   Procedure: INSERTION OF DIALYSIS CATHETER;  Surgeon: Garner Nash, DO;  Location: [place];  Service: Pulmonary;  Laterality: N/A;   JOINT REPLACEMENT  KNEE ARTHROSCOPY Left 07/16/2016   REVERSE SHOULDER ARTHROPLASTY Right 06/29/2014   Procedure: REVERSE TOTAL SHOULDER ARTHROPLASTY;  Surgeon: Netta Cedars, MD;  Location: [place];  Service: Orthopedics;  Laterality: Right;   REVISION TOTAL SHOULDER TO REVERSE TOTAL SHOULDER Left 05/15/2015   REVISION TOTAL SHOULDER TO REVERSE TOTAL SHOULDER Left 05/15/2015   Procedure: LEFT SHOULDER REVISION TOTAL SHOULDER ARTHRPLASTY TO REVERSE TOTAL SHOULDER ARTHROPLASTY ;  Surgeon: Netta Cedars, MD;  Location: [place];  Service: Orthopedics;  Laterality: Left;   tear duct surgery     TOTAL ABDOMINAL HYSTERECTOMY      TOTAL HIP ARTHROPLASTY     x 3(rt x2, lt x1)   TOTAL KNEE ARTHROPLASTY Right 08/08/2013   Procedure: RIGHT TOTAL KNEE ARTHROPLASTY WITH SAPHENOUS NERVE SCAR EXCISION;  Surgeon: Mauri Pole, MD;  [facility];  Service: Orthopedics;  Laterality: Right;   TOTAL KNEE ARTHROPLASTY Left 08/25/2016   Procedure: LEFT TOTAL KNEE ARTHROPLASTY;  Surgeon: Paralee Cancel, MD;  [facility];  Service: Orthopedics;  Laterality: Left;  70 mins; Adductor block   TOTAL SHOULDER ARTHROPLASTY Left 07/15/2012   Procedure: LEFT TOTAL SHOULDER ARTHROPLASTY;  Surgeon: Augustin Schooling, MD;  Location: [place];  Service: Orthopedics;  Laterality: Left;   TOTAL SHOULDER REVISION Right 12/21/2014   Procedure: RIGHT SHOULDER POLY EXCHANGE ;  Surgeon: Netta Cedars, MD;  Location: [place];  Service: Orthopedics;  Laterality: Right;   URETEROSCOPY  stent placement    Current Outpatient Medications  Medication Sig Dispense Refill   AMITIZA 24 MCG capsule Take 24 mcg by mouth 2 (two) times daily.      carvedilol (COREG) 12.5 MG tablet Take 12.5 mg by mouth 2 (two) times daily.      citalopram (CELEXA) 20 MG tablet Take 20 mg by mouth at bedtime.     furosemide (LASIX) 20 MG tablet Take 2 tablets by mouth daily.     hydrocortisone (CORTEF) 10 MG tablet Take 5-10 mg by mouth See admin instructions. Take 10mg  in the AM and 5mg  in the afternoon.     LORazepam (ATIVAN) 0.5 MG tablet Take 1 tablet (0.5 mg total) by mouth every 8 (eight) hours. 30 tablet 5   Multiple Vitamin (MULTIVITAMIN ADULT PO) Take 1 tablet by mouth daily.     potassium chloride (KLOR-CON) 10 MEQ tablet Take 10 mEq by mouth 2 (two) times daily.     pramipexole (MIRAPEX) 1 MG tablet TAKE 1 & 1/2 (ONE & ONE-HALF) TABLETS BY MOUTH AT BEDTIME 45 tablet 5   predniSONE (DELTASONE) 20 MG tablet Take 3 pills po the day before infusions. 12 tablet 0   promethazine (PHENERGAN) 25 MG tablet Take 1 tablet (25 mg total) by mouth every 6 (six) hours as needed for  nausea or vomiting. 60 tablet 0   riTUXimab (RITUXAN IV) Inject into the vein.     telmisartan-hydrochlorothiazide (MICARDIS HCT) 40-12.5 MG tablet Take 1 tablet by mouth every morning.     tiZANidine (ZANAFLEX) 4 MG tablet Take 1 to 1 1/2 tablets by mouth 3 times a day daily as needed. 270 tablet 3   No current facility-administered medications for this visit.   Facility-Administered Medications Ordered in Other Visits  Medication Dose Route Frequency Provider Last Rate Last Admin   chlorhexidine (HIBICLENS) 4 % liquid 4 application  60 mL Topical Once Cline Crock, PA-C       technetium tetrofosmin (TC-MYOVIEW) injection 31.7 milli Curie  40.9 millicurie Intravenous Once PRN Dorothy Spark,  MD        Allergies  Allergen Reactions   Codeine Nausea And Vomiting    Can take if takes phernergan   Hydrocodone Nausea Only   Robaxin [Methocarbamol] Rash    Social History   Socioeconomic History   Marital status: Married    Spouse name: Public house manager   Number of children: 0   Years of education: Not on file   Highest education level: Not on file  Occupational History   Occupation: Retired     Fish farm manager: [employer]  Tobacco Use   Smoking status: Never   Smokeless tobacco: Never  Vaping Use   Vaping Use: Never used  Substance and Sexual Activity   Alcohol use: No    Alcohol/week: 0.0 standard drinks   Drug use: No   Sexual activity: Not on file  Other Topics Concern   Not on file  Social History Narrative   Marital Status:  Married Lobbyist)   Living Situation: Lives with spouse   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  Physical Exam: Vitals:   11/06/20 0944  BP: (!) 148/80  Pulse: 68  SpO2: 95%  Weight: 287 lb (130.2 kg)  Height: 5\' 5"  (1.651 m)    GEN- The patient is well appearing, alert and oriented x 3 today.   HEENT: normocephalic, atraumatic; sclera clear, conjunctiva pink; hearing intact; oropharynx clear; neck supple, no JVP Lymph- no cervical lymphadenopathy Lungs- Clear to ausculation bilaterally, normal work of breathing.  No wheezes, rales, rhonchi Heart- Regular rate and rhythm, no murmurs, rubs or gallops, PMI not laterally displaced GI- soft, non-tender, non-distended, bowel sounds present, no hepatosplenomegaly Extremities- no clubbing, cyanosis, or edema; DP/PT/radial pulses 2+ bilaterally MS- no significant deformity or atrophy Skin- warm and dry, no rash or lesion Psych- euthymic mood, full affect Neuro- strength and sensation are intact  EKG is not ordered.   Additional studies reviewed include: Previous EP office notes.   Monitor 10/17/2020 Patient had a min HR of 49 bpm, max HR of 184 bpm, and avg HR of 68 bpm. Predominant underlying rhythm was Sinus Rhythm. 2 Ventricular Tachycardia runs occurred, the run with the fastest interval lasting 5 beats with a max rate of 174 bpm, the longest  lasting 5 beats with an avg rate of 163 bpm. 46 Supraventricular Tachycardia runs occurred, the run with the fastest interval lasting 5 beats with a max rate of 184 bpm, the longest lasting 27.7 secs with an avg rate of 106 bpm. Isolated SVEs were rare  (<1.0%), SVE Couplets were rare (<1.0%), and SVE Triplets were rare (<1.0%). Isolated VEs were rare (<1.0%), and no VE Couplets or VE Triplets were  present.   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 Will need to make sure its compatible with her co-morbidities and other medications.   Body mass index is 47.76 kg/m. Lifestyle modification has been encouraged. Exercising is difficult with her co-morbidities and neuropathy.   Labs today. RTC 6 months. Sooner with issues.   Shirley Friar, PA-C  11/06/20 10:15 AM

## 2020-11-06 ENCOUNTER — Ambulatory Visit (INDEPENDENT_AMBULATORY_CARE_PROVIDER_SITE_OTHER): Payer: Medicare Other | Admitting: Student

## 2020-11-06 ENCOUNTER — Encounter: Payer: Self-pay | Admitting: Student

## 2020-11-06 ENCOUNTER — Other Ambulatory Visit: Payer: Self-pay

## 2020-11-06 VITALS — BP 148/80 | HR 68 | Ht 65.0 in | Wt 287.0 lb

## 2020-11-06 DIAGNOSIS — I493 Ventricular premature depolarization: Secondary | ICD-10-CM

## 2020-11-06 DIAGNOSIS — I471 Supraventricular tachycardia: Secondary | ICD-10-CM | POA: Diagnosis not present

## 2020-11-06 DIAGNOSIS — R609 Edema, unspecified: Secondary | ICD-10-CM

## 2020-11-06 LAB — BASIC METABOLIC PANEL
BUN/Creatinine Ratio: 24 (ref 12–28)
BUN: 23 mg/dL (ref 8–27)
CO2: 23 mmol/L (ref 20–29)
Calcium: 10.1 mg/dL (ref 8.7–10.3)
Chloride: 100 mmol/L (ref 96–106)
Creatinine, Ser: 0.95 mg/dL (ref 0.57–1.00)
Glucose: 99 mg/dL (ref 70–99)
Potassium: 3.8 mmol/L (ref 3.5–5.2)
Sodium: 140 mmol/L (ref 134–144)
eGFR: 66 mL/min/{1.73_m2} (ref >59)

## 2020-11-06 MED ORDER — CARVEDILOL 25 MG PO TABS: 25.0000 mg | ORAL_TABLET | Freq: Two times a day (BID) | ORAL | 3 refills | Status: DC

## 2020-11-06 NOTE — Patient Instructions (Signed)
Medication Instructions:  Your physician has recommended you make the following change in your medication:   INCREASE: Carvedilol to 25mg  twice daily  *If you need a refill on your cardiac medications before your next appointment, please call your pharmacy*   Lab Work: TODAY: BMET  If you have labs (blood work) drawn today and your tests are completely normal, you will receive your results only by: [place] (if you have MyChart) OR A paper copy in the mail If you have any lab test that is abnormal or we need to change your treatment, we will call you to review the results.   Testing/Procedures: Your physician has requested that you have an echocardiogram. Echocardiography is a painless test that uses sound waves to create images of your heart. It provides your doctor with information about the size and shape of your heart and how well your heart's chambers and valves are working. This procedure takes approximately one hour. There are no restrictions for this procedure.   Follow-Up: At [facility], you and your health needs are our priority.  As part of our continuing mission to provide you with exceptional heart care, we have created designated Provider Care Teams.  These Care Teams include your primary Cardiologist (physician) and Advanced Practice Providers (APPs -  Physician Assistants and Nurse Practitioners) who all work together to provide you with the care you need, when you need it.   Your next appointment:   6 month(s)  The format for your next appointment:   In Person  Provider:   You may see Virl Axe, MD or one of the following Advanced Practice Providers on your designated Care Team:   Tommye Standard, [place] "[facility]" [place], [place]

## 2020-11-15 ENCOUNTER — Other Ambulatory Visit: Payer: Self-pay | Admitting: Neurology

## 2020-11-18 NOTE — Telephone Encounter (Signed)
Received refill request for Lorazepam.  Last OV was on 10/30/20.  Next OV is scheduled for 05/01/21 .  Last RX was written on 09/14/20 for 30 tabs.   [place] Drug Database has been reviewed.

## 2020-11-22 ENCOUNTER — Other Ambulatory Visit: Payer: Self-pay

## 2020-11-22 ENCOUNTER — Ambulatory Visit (HOSPITAL_COMMUNITY): Payer: Medicare Other | Attending: Internal Medicine

## 2020-11-22 DIAGNOSIS — I351 Nonrheumatic aortic (valve) insufficiency: Secondary | ICD-10-CM | POA: Diagnosis not present

## 2020-11-22 DIAGNOSIS — E785 Hyperlipidemia, unspecified: Secondary | ICD-10-CM | POA: Insufficient documentation

## 2020-11-22 DIAGNOSIS — G4730 Sleep apnea, unspecified: Secondary | ICD-10-CM | POA: Diagnosis not present

## 2020-11-22 DIAGNOSIS — N183 Chronic kidney disease, stage 3 unspecified: Secondary | ICD-10-CM | POA: Insufficient documentation

## 2020-11-22 DIAGNOSIS — I471 Supraventricular tachycardia: Secondary | ICD-10-CM | POA: Diagnosis not present

## 2020-11-22 DIAGNOSIS — I493 Ventricular premature depolarization: Secondary | ICD-10-CM | POA: Insufficient documentation

## 2020-11-22 DIAGNOSIS — I129 Hypertensive chronic kidney disease with stage 1 through stage 4 chronic kidney disease, or unspecified chronic kidney disease: Secondary | ICD-10-CM | POA: Diagnosis not present

## 2020-11-22 LAB — ECHOCARDIOGRAM COMPLETE
Area-P 1/2: 3.03 cm2
S' Lateral: 3.6 cm

## 2020-11-28 ENCOUNTER — Telehealth: Payer: Self-pay

## 2020-11-28 NOTE — Telephone Encounter (Signed)
Called pt to offer appt per her request to come in to discuss her concerns regarding her Echo. LM for her to call back.

## 2020-12-17 NOTE — Progress Notes (Signed)
PCP:  [name], MD Primary Cardiologist: None Electrophysiologist: Virl Axe, MD   Connie Haynes is a 65 y.o. female seen today for Virl Axe, MD for routine electrophysiology followup.  Since last being seen in our clinic the patient reports doing very well. She has several questions re: her Echo which were answered to her satisfaction. She understands her edema is most likely a combination of her adrenal insufficiency and steroid use, with a likely component of chronic venous insufficiency. She denies chest pain, palpitations, dyspnea, PND, orthopnea, nausea, vomiting, dizziness, syncope, weight gain, or early satiety. She limits herself to approx 1200 calories daily. Exercise is limited.  Past Medical History:  Diagnosis Date   Adrenal insufficiency ([place])    Anemia    denies   Anxiety    Basal cell carcinoma    eye lid   Chronic kidney disease, stage 3 (HCC)    Cough    hx. dry cough chronic   Disorder of tear duct system    frequent tearing of eyes   GERD (gastroesophageal reflux disease)    H/O: whooping cough 2000   History of kidney infection 2015   ? sepsis   History of kidney stones    History of kidney stones    Hyperlipidemia    Hypertension    IBS (irritable bowel syndrome)    controlled with med   Osteoarthritis    arthritis -joints-limited ROM shoulders more on right   PONV (postoperative nausea and vomiting)     per pt hx of low BP following shoulder surgery in 05-2015; was managed in hospital with IV fluids and d/c BP being taken a tthe time; pt was dced and F/U with endcrinoopgist who dx with adrenal insurffiency and started steroid therapy; treatmetnt with positive outome, now managed chronically on hydrocortisone tablets;   Sleep apnea    no longer using cpap   Stroke [facility])    per patient "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 Hulen Skains)   [place]     '93 for WPW syndrome   CARPAL TUNNEL RELEASE Right 12/21/2014   Procedure: RIGHT CARPAL TUNNEL RELEASE ;  Surgeon: Netta Cedars, MD;  Location: [place];  Service: Orthopedics;  Laterality: Right;   CARPAL TUNNEL RELEASE Left    CERVICAL SPINE SURGERY     cervical fusion with hardware retained   COLON SURGERY     "colon detached from abdomen"   COLONOSCOPY WITH PROPOFOL N/A 05/10/2014   Procedure: COLONOSCOPY WITH PROPOFOL;  Surgeon: Juanita Craver, MD;  [facility];  Service: Endoscopy;  Laterality: N/A;   CYSTOSCOPY/RETROGRADE/URETEROSCOPY Left 12/18/2013   Procedure: CYSTOSCOPY/RETROGRADE/LEFT STENT;  Surgeon: Malka So, MD;  [facility];  Service: Urology;  Laterality: Left;   ESOPHAGOGASTRODUODENOSCOPY (EGD) WITH PROPOFOL N/A 05/10/2014   Procedure: ESOPHAGOGASTRODUODENOSCOPY (EGD) WITH PROPOFOL;  Surgeon: Juanita Craver, MD;  [facility];  Service: Endoscopy;  Laterality: N/A;   EYELID CARCINOMA EXCISION     INSERTION OF DIALYSIS CATHETER N/A  08/16/2019   Procedure: INSERTION OF DIALYSIS CATHETER;  Surgeon: Garner Nash, DO;  Location: [place] [facility];  Service: Pulmonary;  Laterality: N/A;   JOINT REPLACEMENT     KNEE ARTHROSCOPY Left 07/16/2016   REVERSE SHOULDER ARTHROPLASTY Right 06/29/2014   Procedure: REVERSE TOTAL SHOULDER ARTHROPLASTY;  Surgeon: Netta Cedars, MD;  Location: [place];  Service: Orthopedics;  Laterality: Right;   REVISION TOTAL SHOULDER TO REVERSE TOTAL SHOULDER Left 05/15/2015   REVISION TOTAL SHOULDER TO REVERSE TOTAL SHOULDER Left 05/15/2015   Procedure: LEFT SHOULDER REVISION TOTAL  SHOULDER ARTHRPLASTY TO REVERSE TOTAL SHOULDER ARTHROPLASTY ;  Surgeon: Netta Cedars, MD;  Location: [place];  Service: Orthopedics;  Laterality: Left;   tear duct surgery     TOTAL ABDOMINAL HYSTERECTOMY     TOTAL HIP ARTHROPLASTY     x 3(rt x2, lt x1)   TOTAL KNEE ARTHROPLASTY Right 08/08/2013   Procedure: RIGHT TOTAL KNEE ARTHROPLASTY WITH SAPHENOUS NERVE SCAR EXCISION;  Surgeon: Mauri Pole, MD;  [facility];  Service: Orthopedics;  Laterality: Right;   TOTAL KNEE ARTHROPLASTY Left 08/25/2016   Procedure: LEFT TOTAL KNEE ARTHROPLASTY;  Surgeon: Paralee Cancel, MD;  [facility];  Service: Orthopedics;  Laterality: Left;  70 mins; Adductor block   TOTAL SHOULDER ARTHROPLASTY Left 07/15/2012   Procedure: LEFT TOTAL SHOULDER ARTHROPLASTY;  Surgeon: Augustin Schooling, MD;  Location: [place];  Service: Orthopedics;  Laterality: Left;   TOTAL SHOULDER REVISION Right 12/21/2014   Procedure: RIGHT SHOULDER POLY EXCHANGE ;  Surgeon: Netta Cedars, MD;  Location: [place];  Service: Orthopedics;  Laterality: Right;   URETEROSCOPY  stent placement    Current Outpatient Medications  Medication Sig Dispense Refill   AMITIZA 24 MCG capsule Take 24 mcg by mouth 2 (two) times daily.      carvedilol (COREG) 25 MG tablet Take 1 tablet (25 mg total) by mouth 2 (two) times daily. 180 tablet 3   citalopram (CELEXA) 40 MG tablet Take 40 mg by mouth daily.     furosemide (LASIX) 20 MG tablet Take 2 tablets by mouth daily.     hydrocortisone (CORTEF) 10 MG tablet Take 5-10 mg by mouth See admin instructions. Take 10mg  in the AM and 5mg  in the afternoon.     LORazepam (ATIVAN) 0.5 MG tablet TAKE 1 TABLET BY MOUTH EVERY 8 HOURS 30 tablet 0   Multiple Vitamin (MULTIVITAMIN ADULT PO) Take 1 tablet by mouth daily.     potassium chloride (KLOR-CON) 10 MEQ tablet Take 10 mEq by mouth 2 (two) times daily.     pramipexole (MIRAPEX) 1 MG tablet TAKE 1 & 1/2 (ONE & ONE-HALF) TABLETS BY MOUTH AT BEDTIME 45 tablet 5    predniSONE (DELTASONE) 20 MG tablet Take 3 pills po the day before infusions. 12 tablet 0   promethazine (PHENERGAN) 25 MG tablet Take 1 tablet (25 mg total) by mouth every 6 (six) hours as needed for nausea or vomiting. 60 tablet 0   riTUXimab (RITUXAN IV) Inject into the vein.     telmisartan-hydrochlorothiazide (MICARDIS HCT) 80-12.5 MG tablet Take 1 tablet by mouth daily.     tiZANidine (ZANAFLEX) 4 MG tablet Take 1 to 1 1/2 tablets by mouth 3 times a day daily as needed. 270 tablet 3   No current facility-administered medications for this visit.   Facility-Administered Medications Ordered in Other Visits  Medication Dose Route Frequency Provider Last Rate Last Admin   chlorhexidine (HIBICLENS) 4 % liquid 4 application  60  mL Topical Once Cline Crock, PA-C       technetium tetrofosmin (TC-MYOVIEW) injection 31.7 milli Curie  16.1 millicurie Intravenous Once PRN Dorothy Spark, MD        Allergies  Allergen Reactions   Codeine Nausea And Vomiting    Can take if takes phernergan   Hydrocodone Nausea Only   Robaxin [Methocarbamol] Rash    Social History   Socioeconomic History   Marital status: Married    Spouse name: Public house manager   Number of children: 0   Years of education: Not on file   Highest education level: Not on file  Occupational History   Occupation: Retired     Fish farm manager: [employer]  Tobacco Use   Smoking status: Never   Smokeless tobacco: Never  Vaping Use   Vaping Use: Never used  Substance and Sexual Activity   Alcohol use: No    Alcohol/week: 0.0 standard drinks   Drug use: No   Sexual activity: Not on file  Other Topics Concern   Not on file  Social History Narrative   Marital Status:  Married Lobbyist)   Living Situation: Lives with spouse   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  Physical Exam: Vitals:   12/19/20 0828  BP: 126/81  Pulse: 69  SpO2: 95%  Weight: 288 lb 12.8 oz (131 kg)  Height: 5\' 5"  (1.651 m)    GEN- The patient is well appearing, alert and oriented x 3 today.   HEENT: normocephalic, atraumatic; sclera clear, conjunctiva pink; hearing intact; oropharynx clear; neck supple, no JVP Lymph- no cervical lymphadenopathy Lungs- Clear to ausculation bilaterally, normal work of breathing.  No wheezes, rales, rhonchi Heart- Regular rate and rhythm, no murmurs, rubs or gallops, PMI not laterally displaced GI- soft, non-tender, non-distended, bowel sounds present, no hepatosplenomegaly Extremities- no clubbing, cyanosis, or edema; DP/PT/radial pulses 2+ bilaterally MS- no significant deformity or atrophy Skin- warm and dry, no rash or lesion Psych- euthymic mood, full affect Neuro- strength and sensation are intact  EKG is not ordered.   Additional studies reviewed include: Previous EP office notes.   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 Diastolic parameters normal as well.  No diastolic dysfunction, I think low utility for spiro at this point, and more likely to interact with her complicated medical  regimen. Encouraged compression hose or at least socks as tolerated   Body mass index is 48.06 kg/m.  Continue to encoruage lifestyle modification has been  encouraged. Exercising is difficult with her co-morbidities and neuropathy  Follow up with Dr. Caryl Comes in 6 months   Shirley Friar, PA-C  12/19/20 8:46 AM

## 2020-12-19 ENCOUNTER — Other Ambulatory Visit: Payer: Self-pay

## 2020-12-19 ENCOUNTER — Ambulatory Visit (INDEPENDENT_AMBULATORY_CARE_PROVIDER_SITE_OTHER): Payer: Medicare Other | Admitting: Student

## 2020-12-19 ENCOUNTER — Encounter: Payer: Self-pay | Admitting: Student

## 2020-12-19 VITALS — BP 126/81 | HR 69 | Ht 65.0 in | Wt 288.8 lb

## 2020-12-19 DIAGNOSIS — I493 Ventricular premature depolarization: Secondary | ICD-10-CM

## 2020-12-19 DIAGNOSIS — I471 Supraventricular tachycardia: Secondary | ICD-10-CM

## 2020-12-19 DIAGNOSIS — I456 Pre-excitation syndrome: Secondary | ICD-10-CM

## 2020-12-19 DIAGNOSIS — R609 Edema, unspecified: Secondary | ICD-10-CM | POA: Diagnosis not present

## 2020-12-19 NOTE — Patient Instructions (Signed)
Medication Instructions:  Your physician recommends that you continue on your current medications as directed. Please refer to the Current Medication list given to you today.  *If you need a refill on your cardiac medications before your next appointment, please call your pharmacy*   Lab Work: None If you have labs (blood work) drawn today and your tests are completely normal, you will receive your results only by: MyChart [place] (if you have MyChart) OR A paper copy in the mail If you have any lab test that is abnormal or we need to change your treatment, we will call you to review the results.   Follow-Up: At [facility], you and your health needs are our priority.  As part of our continuing mission to provide you with exceptional heart care, we have created designated Provider Care Teams.  These Care Teams include your primary Cardiologist (physician) and Advanced Practice Providers (APPs -  Physician Assistants and Nurse Practitioners) who all work together to provide you with the care you need, when you need it.  Your next appointment:   6 month(s)  The format for your next appointment:   In Person  Provider:   You may see Steven Klein, MD or one of the following Advanced Practice Providers on your designated Care Team:   Renee Ursuy, PA-C Michael "Andy" Tillery, PA-C    

## 2020-12-20 DIAGNOSIS — G4734 Idiopathic sleep related nonobstructive alveolar hypoventilation: Secondary | ICD-10-CM | POA: Insufficient documentation

## 2020-12-20 DIAGNOSIS — N3001 Acute cystitis with hematuria: Secondary | ICD-10-CM | POA: Insufficient documentation

## 2020-12-20 DIAGNOSIS — Z9981 Dependence on supplemental oxygen: Secondary | ICD-10-CM | POA: Insufficient documentation

## 2020-12-20 DIAGNOSIS — M810 Age-related osteoporosis without current pathological fracture: Secondary | ICD-10-CM | POA: Insufficient documentation

## 2020-12-20 DIAGNOSIS — Z780 Asymptomatic menopausal state: Secondary | ICD-10-CM | POA: Insufficient documentation

## 2021-01-10 ENCOUNTER — Other Ambulatory Visit: Payer: Self-pay | Admitting: Neurology

## 2021-01-14 ENCOUNTER — Encounter: Payer: Self-pay | Admitting: Rehabilitation

## 2021-01-14 ENCOUNTER — Ambulatory Visit: Payer: Medicare Other | Attending: Family Medicine | Admitting: Rehabilitation

## 2021-01-14 ENCOUNTER — Other Ambulatory Visit: Payer: Self-pay

## 2021-01-14 DIAGNOSIS — R2689 Other abnormalities of gait and mobility: Secondary | ICD-10-CM | POA: Insufficient documentation

## 2021-01-14 DIAGNOSIS — R296 Repeated falls: Secondary | ICD-10-CM | POA: Insufficient documentation

## 2021-01-14 DIAGNOSIS — R208 Other disturbances of skin sensation: Secondary | ICD-10-CM | POA: Diagnosis present

## 2021-01-14 DIAGNOSIS — R2681 Unsteadiness on feet: Secondary | ICD-10-CM | POA: Diagnosis present

## 2021-01-14 DIAGNOSIS — M6281 Muscle weakness (generalized): Secondary | ICD-10-CM | POA: Insufficient documentation

## 2021-01-14 NOTE — Therapy (Signed)
[facility] [address] [place], [place], [address] Phone: [phone]   Fax:  [phone]  Physical Therapy Evaluation  Patient Details  Name: Connie Haynes MRN: [id number] Date of Birth: 1955/08/29 Referring Provider (PT): Arlice Colt, MD   Encounter Date: 01/14/2021   PT End of Session - 01/14/21 0900     Visit Number 1    Number of Visits 17    Date for PT Re-Evaluation 03/15/21    Authorization Type [employer] Medicare (10th visit PN needed)    Progress Note Due on Visit 10    PT Start Time 0802    PT Stop Time 0848    PT Time Calculation (min) 46 min    Activity Tolerance Patient tolerated treatment well    Behavior During Therapy [facility] for tasks assessed/performed             Past Medical History:  Diagnosis Date   Adrenal insufficiency ([place])    Anemia    denies   Anxiety    Basal cell carcinoma    eye lid   Chronic kidney disease, stage 3 (HCC)    Cough    hx. dry cough chronic   Disorder of tear duct system    frequent tearing of eyes   GERD (gastroesophageal reflux disease)    H/O: whooping cough 2000   History of kidney infection 2015   ? sepsis   History of kidney stones    History of kidney stones    Hyperlipidemia    Hypertension    IBS (irritable bowel syndrome)    controlled with med   Osteoarthritis    arthritis -joints-limited ROM shoulders more on right   PONV (postoperative nausea and vomiting)     per pt hx of low BP following shoulder surgery in 05-2015; was managed in hospital with IV fluids and d/c BP being taken a tthe time; pt was dced and F/U with endcrinoopgist who dx with adrenal insurffiency and started steroid therapy; treatmetnt with positive outome, now managed chronically on hydrocortisone tablets;   Sleep apnea    no longer using cpap   Stroke [facility])    per patient "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 Hulen Skains)   [place]     '93 for WPW syndrome   CARPAL TUNNEL RELEASE Right 12/21/2014   Procedure: RIGHT CARPAL TUNNEL RELEASE ;  Surgeon: Netta Cedars, MD;  Location: [facility];  Service: Orthopedics;  Laterality: Right;   CARPAL TUNNEL RELEASE Left    CERVICAL SPINE SURGERY     cervical fusion with hardware retained   COLON SURGERY     "colon detached from abdomen"   COLONOSCOPY WITH PROPOFOL N/A 05/10/2014   Procedure: COLONOSCOPY WITH PROPOFOL;  Surgeon: Juanita Craver, MD;  [facility]: [facility];  Service: Endoscopy;  Laterality: N/A;   CYSTOSCOPY/RETROGRADE/URETEROSCOPY Left 12/18/2013   Procedure: CYSTOSCOPY/RETROGRADE/LEFT STENT;  Surgeon: Malka So, MD;  Location: [facility];  Service: Urology;  Laterality: Left;  ESOPHAGOGASTRODUODENOSCOPY (EGD) WITH PROPOFOL N/A 05/10/2014   Procedure: ESOPHAGOGASTRODUODENOSCOPY (EGD) WITH PROPOFOL;  Surgeon: Juanita Craver, MD;  [facility];  Service: Endoscopy;  Laterality: N/A;   EYELID CARCINOMA EXCISION     INSERTION OF DIALYSIS CATHETER N/A 08/16/2019   Procedure: INSERTION OF DIALYSIS CATHETER;  Surgeon: Garner Nash, DO;  Location: [place];  Service: Pulmonary;  Laterality: N/A;   JOINT REPLACEMENT     KNEE ARTHROSCOPY Left 07/16/2016   REVERSE SHOULDER ARTHROPLASTY Right 06/29/2014   Procedure: REVERSE TOTAL SHOULDER ARTHROPLASTY;  Surgeon: Netta Cedars, MD;  Location: [facility];  Service: Orthopedics;  Laterality: Right;   REVISION TOTAL SHOULDER TO REVERSE TOTAL SHOULDER Left 05/15/2015   REVISION TOTAL SHOULDER TO REVERSE TOTAL  SHOULDER Left 05/15/2015   Procedure: LEFT SHOULDER REVISION TOTAL SHOULDER ARTHRPLASTY TO REVERSE TOTAL SHOULDER ARTHROPLASTY ;  Surgeon: Netta Cedars, MD;  Location: [facility];  Service: Orthopedics;  Laterality: Left;   tear duct surgery     TOTAL ABDOMINAL HYSTERECTOMY     TOTAL HIP ARTHROPLASTY     x 3(rt x2, lt x1)   TOTAL KNEE ARTHROPLASTY Right 08/08/2013   Procedure: RIGHT TOTAL KNEE ARTHROPLASTY WITH SAPHENOUS NERVE SCAR EXCISION;  Surgeon: Mauri Pole, MD;  [facility];  Service: Orthopedics;  Laterality: Right;   TOTAL KNEE ARTHROPLASTY Left 08/25/2016   Procedure: LEFT TOTAL KNEE ARTHROPLASTY;  Surgeon: Paralee Cancel, MD;  [facility];  Service: Orthopedics;  Laterality: Left;  70 mins; Adductor block   TOTAL SHOULDER ARTHROPLASTY Left 07/15/2012   Procedure: LEFT TOTAL SHOULDER ARTHROPLASTY;  Surgeon: Augustin Schooling, MD;  Location: [facility];  Service: Orthopedics;  Laterality: Left;   TOTAL SHOULDER REVISION Right 12/21/2014   Procedure: RIGHT SHOULDER POLY EXCHANGE ;  Surgeon: Netta Cedars, MD;  Location: [facility];  Service: Orthopedics;  Laterality: Right;   URETEROSCOPY  stent placement    There were no vitals filed for this visit.    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  So I have a hard time walking and I have balance issues.  The most important thing is working on my walking and balance and strengthening.  I use my thighs mostly for walking so I need to strengthen those.  Left leg is worse than R leg and sometimes it goes completely numb.    Pertinent History Adrenal insufficiency, HTN, wearing 2.5L O2 only at night.    Limitations Walking;Standing;House hold activities    Patient Stated Goals To work on my balance and walking    Currently in Pain? No/denies   has spasms at times in LLE, but not often               [facility] PT Assessment  - 01/14/21 0812       Assessment   Medical Diagnosis Transverse Myelitis, NMO   July 2021   Referring Provider (PT) Arlice Colt, MD    Prior Therapy Had OP PT at [facility]      Precautions   Precautions Fall    Precaution Comments Has L visual difficulties      Balance Screen   Has the patient fallen in the past 6 months Yes    How many times? stumbles daily    Has the patient had a decrease in activity level because of a fear of falling?  Yes  Is the patient reluctant to leave their home because of a fear of falling?  Yes      Home Environment   Living Environment Private residence    Living Arrangements Spouse/significant other   husband and brother   Available Help at Discharge Family;Available PRN/intermittently    Type of Home House    Home Access Stairs to enter    Entrance Stairs-Number of Steps 1 small step, then another into house    Home Layout Two level;Able to live on main level with bedroom/bathroom    [place] - 2 wheels;Cane - single point   small shower stall   Additional Comments Notes that she is better in the morning and about midday (1pm) takes a 2-3 hour nap and then remains at home rest of day      Prior Function   Level of Independence Independent with basic ADLs    Vocation Retired    Leisure Go to go out to eat, shopping      Cognition   Overall Cognitive Status Impaired/Different from baseline    Behaviors --   trouble with word finding     Observation/Other Assessments   Observations Pt must look at feet with gait and transfers to complete safely      Sensation   Light Touch Impaired Detail    Light Touch Impaired Details Absent RUE;Absent LUE   from knee down, absent   Hot/Cold Impaired Detail    Hot/Cold Impaired Details Absent RLE;Absent LLE    Proprioception Impaired Detail    Proprioception Impaired Details Absent RLE;Absent LLE      Coordination   Gross Motor Movements are Fluid and Coordinated No     Fine Motor Movements are Fluid and Coordinated No    Heel Shin Test Pt is unable to perform      ROM / Strength   AROM / PROM / Strength Strength      Strength   Overall Strength Deficits    Overall Strength Comments L hip flex 3+/5, L knee flex 3+/5, L ext 4/5, L ankle DF 4/5.  R hip flex 4/5, R knee ext 4/5, R knee flex 3+/5, R ankle DF 4/5.  Seated hip abd 4/5, seated hip add 3+/5.      Transfers   Transfers Sit to Stand;Stand to Sit    Sit to Stand 6: Modified independent (Device/Increase time)    Five time sit to stand comments  9.81 secs    Stand to Sit 6: Modified independent (Device/Increase time)      Ambulation/Gait   Ambulation/Gait Yes    Ambulation/Gait Assistance 4: Min guard    Ambulation Distance (Feet) 100 Feet   then in/out of clinic   Assistive device None    Gait Pattern Step-through pattern;Decreased stride length;Decreased arm swing - right;Decreased arm swing - left;Trunk flexed;Wide base of support    Ambulation Surface Level;Indoor    Gait velocity 1.32 ft/sec without device, min/guard for safety    Stairs Yes    Stairs Assistance 4: Min guard;4: Min assist    Stairs Assistance Details (indicate cue type and reason) Pt agreeable to doing single step forwards then stepping back to floor backwards with B rails    Stair Management Technique Two rails;Step to pattern;Forwards;Backwards    Number of Stairs 1    Height of Stairs 6      Standardized Balance Assessment   Standardized Balance Assessment Berg Balance Test      [employer] Test  Sit to Stand Able to stand without using hands and stabilize independently    Standing Unsupported Able to stand 2 minutes with supervision    Sitting with Back Unsupported but Feet Supported on Floor or Stool Able to sit safely and securely 2 minutes    Stand to Sit Sits safely with minimal use of hands    Transfers Able to transfer safely, minor use of hands    Standing Unsupported with Eyes Closed Needs help to  keep from falling                        Objective measurements completed on examination: See above findings.                PT Education - 01/14/21 0859     Education Details Education on evaluation findings, systems that help with balance, POC, goals    Person(s) Educated Patient    Methods Explanation    Comprehension Verbalized understanding              PT Short Term Goals - 01/14/21 0909       PT SHORT TERM GOAL #1   Title Pt will  be IND w/ initial HEP in order to indicate improved functional mobility and dec fall risk.  (Target Date: 02/13/21)    Time 4    Period Weeks    Status New    Target Date 02/13/21      PT SHORT TERM GOAL #2   Title Pt will complete BERG balance and improve score by 6 points from baseline in order to indicate dec fall risk.    Time 4    Period Weeks    Status New      PT SHORT TERM GOAL #3   Title Pt will improve gait speed to >/=1.92 ft/sec with device (if needed) in order to indicate dec fall risk.    Time 4    Period Weeks    Status New      PT SHORT TERM GOAL #4   Title Willl assess appropriateness for use of AD with gait to determine if safe for use at home/in community.    Time 4    Period Weeks    Status New      PT SHORT TERM GOAL #5   Title Will perform TUG and write appropriate LTG to reflect dec fall risk.    Time 4    Period Weeks    Status New               PT Long Term Goals - 01/14/21 0911       PT LONG TERM GOAL #1   Title Pt will be IND with final HEP in order to indicate improved functional mobility and dec fall risk. (Target Date: 03/15/21)    Time 8    Period Weeks    Status New    Target Date 03/15/21      PT LONG TERM GOAL #2   Title Pt will improve BERG balance score by 10 points from baseline in order to indicate dec fall risk.    Time 8    Period Weeks    Status New      PT LONG TERM GOAL #3   Title Pt will ambulate with gait speed >/=2.5 ft/sec with device (if  needed) in order to indicate dec fall risk.    Time 8    Period Weeks    Status  New      PT LONG TERM GOAL #4   Title Pt will ambulate 300' w/ LRAD over unlevel paved surfaces (with device if needed) at S level in order to indicate improved community safety.    Time 8    Period Weeks    Status New                    Plan - 01/14/21 0900     Clinical Impression Statement Pt is a pleasant 65 y.o female with diagnosis of transverse myelitis and neuromyelitis optica in July 2021 with initially no sensation from waist down, but has since regained sensation down to knee level with steroids and immunotherapy.  She continues to receive injections every 6 months and just had latest in November.  She also has history of HTN, renal insufficiency and needs supplemental O2 (2.5L) at nighttime but reports her SaO2 remains in 90's throughout the day.  Upon PT evaluation, note decreased strength in hips and knees (esp hamstrings) but grossly 3+/5 to 4/5 overall however her absent sensation and proprioception from knee down impacts her balance greatly.  She has a gait speed of 1.32 ft/sec without device indicative of high and recurrent fall risk.  She has to look at her feet when ambulating to know where legs are in space.  She reports that she has tried a RW and cane in the past but she tends to kick these and they get in her way more than assist.  We started her BERG balance assessment and note that so far, when eyes are closed or even statically standing is very difficult, therefore would assume she will have a high fall risk based on this assessment when completed at next session.  Pt wlll benefit from skilled OP neuro in order to address deficits.    Personal Factors and Comorbidities Comorbidity 3+;Time since onset of injury/illness/exacerbation    Comorbidities see above    Examination-Activity Limitations Dressing;Locomotion Level;Squat;Stairs;Stand;Lift;Transfers    Examination-Participation  Restrictions Cleaning;Community Activity;Driving;Laundry;Shop;Meal Prep    Stability/Clinical Decision Making Evolving/Moderate complexity    Clinical Decision Making Moderate    Rehab Potential Good    PT Frequency 2x / week    PT Duration 8 weeks    PT Treatment/Interventions ADLs/Self Care Home Management;Aquatic Therapy;DME Instruction;Gait training;Stair training;Functional mobility training;Therapeutic activities;Therapeutic exercise;Balance training;Neuromuscular re-education;Patient/family education;Orthotic Fit/Training;Energy conservation;Vestibular    PT Next Visit Plan Finish BERG (put in baseline in goals please), TUG (update goal), initiate HEP for balance (corner balance, very simple to begin with), can do some counter top tasks like marching, also needs hamstring strengthening, balance reactions    Consulted and Agree with Plan of Care Patient             Patient will benefit from skilled therapeutic intervention in order to improve the following deficits and impairments:  Abnormal gait, Cardiopulmonary status limiting activity, Decreased activity tolerance, Decreased balance, Decreased coordination, Decreased endurance, Decreased knowledge of use of DME, Decreased mobility, Decreased strength, Difficulty walking, Impaired perceived functional ability, Impaired sensation, Postural dysfunction  Visit Diagnosis: Unsteadiness on feet  Other disturbances of skin sensation  Repeated falls  Other abnormalities of gait and mobility  Muscle weakness (generalized)     Problem List Patient Active Problem List   Diagnosis Date Noted   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[place]) 10/30/2020   Encounter for immunotherapy 10/30/2020   History of optic neuritis 10/30/2020   Urinary retention 07/17/2020   Encounter for monitoring immunomodulating therapy 05/02/2020  Numbness 08/29/2019   Gait disturbance 08/29/2019   Abnormal brain MRI 08/29/2019   High risk medication use 08/29/2019    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[place]) 08/11/2019   Adrenal insufficiency (Addison's disease) ([place]) 08/11/2019   S/P left TKA 08/25/2016   S/P total knee replacement 08/25/2016   S/P shoulder replacement 06/29/2014   OSA (obstructive sleep apnea) 01/22/2014   Left ureteral stone 12/18/2013   UTI (urinary tract infection) 12/18/2013   Morbid obesity ([place]) 08/09/2013   S/P right TKA 08/08/2013   Chronic pain syndrome 03/04/2013   Depressive disorder, not elsewhere classified 03/04/2013   Need for prophylactic vaccination and inoculation against influenza 10/04/2012   Erosive osteoarthritis of multiple sites 10/04/2012   Chronic cough 10/04/2012   Swelling of limb 08/21/2012   Essential hypertension, benign 08/21/2012   Preoperative general physical examination 06/12/2012   Restless leg syndrome 06/12/2012   Anxiety state, unspecified 06/12/2012   Lung nodule 07/16/2011   Cough 04/21/2011   Wolff-Parkinson-White (WPW) syndrome    Osteoarthritis    Hyperlipidemia    IBS (irritable bowel syndrome)    Kidney stones     Cameron Sprang, PT, MPT [facility] [address] [place], [place], [address] Phone: [phone]   Fax:  [phone] 01/14/21, 9:16 AM   Name: Connie Haynes MRN: [id number] Date of Birth: February 02, 1956

## 2021-01-22 ENCOUNTER — Ambulatory Visit: Payer: Medicare Other | Admitting: Physical Therapy

## 2021-01-22 ENCOUNTER — Other Ambulatory Visit: Payer: Self-pay

## 2021-01-22 ENCOUNTER — Encounter: Payer: Self-pay | Admitting: Physical Therapy

## 2021-01-22 DIAGNOSIS — R2689 Other abnormalities of gait and mobility: Secondary | ICD-10-CM

## 2021-01-22 DIAGNOSIS — R2681 Unsteadiness on feet: Secondary | ICD-10-CM

## 2021-01-22 DIAGNOSIS — M6281 Muscle weakness (generalized): Secondary | ICD-10-CM

## 2021-01-22 NOTE — Therapy (Signed)
[facility] [address] [place], [place], [address] Phone: [phone]   Fax:  [phone]  Physical Therapy Treatment  Patient Details  Name: Connie Haynes MRN: [id number] Date of Birth: 01-May-1955 Referring Provider (PT): Arlice Colt, MD   Encounter Date: 01/22/2021   PT End of Session - 01/22/21 0720     Visit Number 2    Number of Visits 17    Date for PT Re-Evaluation 03/15/21    Authorization Type [employer] Medicare (10th visit PN needed)    Progress Note Due on Visit 10    PT Start Time 0718    PT Stop Time 0758    PT Time Calculation (min) 40 min    Activity Tolerance Patient tolerated treatment well    Behavior During Therapy [facility] for tasks assessed/performed             Past Medical History:  Diagnosis Date   Adrenal insufficiency ([place])    Anemia    denies   Anxiety    Basal cell carcinoma    eye lid   Chronic kidney disease, stage 3 (HCC)    Cough    hx. dry cough chronic   Disorder of tear duct system    frequent tearing of eyes   GERD (gastroesophageal reflux disease)    H/O: whooping cough 2000   History of kidney infection 2015   ? sepsis   History of kidney stones    History of kidney stones    Hyperlipidemia    Hypertension    IBS (irritable bowel syndrome)    controlled with med   Osteoarthritis    arthritis -joints-limited ROM shoulders more on right   PONV (postoperative nausea and vomiting)     per pt hx of low BP following shoulder surgery in 05-2015; was managed in hospital with IV fluids and d/c BP being taken a tthe time; pt was dced and F/U with endcrinoopgist who dx with adrenal insurffiency and started steroid therapy; treatmetnt with positive outome, now managed chronically on hydrocortisone tablets;   Sleep apnea    no longer using cpap   Stroke [facility])    per patient "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 Hulen Skains)   [place]     '93 for WPW syndrome   CARPAL TUNNEL RELEASE Right 12/21/2014   Procedure: RIGHT CARPAL TUNNEL RELEASE ;  Surgeon: Netta Cedars, MD;  Location: [place];  Service: Orthopedics;  Laterality: Right;   CARPAL TUNNEL RELEASE Left    CERVICAL SPINE SURGERY     cervical fusion with hardware retained   COLON SURGERY     "colon detached from abdomen"   COLONOSCOPY WITH PROPOFOL N/A 05/10/2014   Procedure: COLONOSCOPY WITH PROPOFOL;  Surgeon: Juanita Craver, MD;  [facility]: [facility];  Service: Endoscopy;  Laterality: N/A;   CYSTOSCOPY/RETROGRADE/URETEROSCOPY Left 12/18/2013   Procedure: CYSTOSCOPY/RETROGRADE/LEFT STENT;  Surgeon: Malka So, MD;  Location: [facility];  Service: Urology;  Laterality: Left;  ESOPHAGOGASTRODUODENOSCOPY (EGD) WITH PROPOFOL N/A 05/10/2014   Procedure: ESOPHAGOGASTRODUODENOSCOPY (EGD) WITH PROPOFOL;  Surgeon: Juanita Craver, MD;  [facility];  Service: Endoscopy;  Laterality: N/A;   EYELID CARCINOMA EXCISION     INSERTION OF DIALYSIS CATHETER N/A 08/16/2019   Procedure: INSERTION OF DIALYSIS CATHETER;  Surgeon: Garner Nash, DO;  Location: [place];  Service: Pulmonary;  Laterality: N/A;   JOINT REPLACEMENT     KNEE ARTHROSCOPY Left 07/16/2016   REVERSE SHOULDER ARTHROPLASTY Right 06/29/2014   Procedure: REVERSE TOTAL SHOULDER ARTHROPLASTY;  Surgeon: Netta Cedars, MD;  Location: [place];  Service: Orthopedics;  Laterality: Right;   REVISION TOTAL SHOULDER TO REVERSE TOTAL SHOULDER Left 05/15/2015   REVISION TOTAL SHOULDER TO REVERSE TOTAL  SHOULDER Left 05/15/2015   Procedure: LEFT SHOULDER REVISION TOTAL SHOULDER ARTHRPLASTY TO REVERSE TOTAL SHOULDER ARTHROPLASTY ;  Surgeon: Netta Cedars, MD;  Location: [place];  Service: Orthopedics;  Laterality: Left;   tear duct surgery     TOTAL ABDOMINAL HYSTERECTOMY     TOTAL HIP ARTHROPLASTY     x 3(rt x2, lt x1)   TOTAL KNEE ARTHROPLASTY Right 08/08/2013   Procedure: RIGHT TOTAL KNEE ARTHROPLASTY WITH SAPHENOUS NERVE SCAR EXCISION;  Surgeon: Mauri Pole, MD;  [facility];  Service: Orthopedics;  Laterality: Right;   TOTAL KNEE ARTHROPLASTY Left 08/25/2016   Procedure: LEFT TOTAL KNEE ARTHROPLASTY;  Surgeon: Paralee Cancel, MD;  [facility];  Service: Orthopedics;  Laterality: Left;  70 mins; Adductor block   TOTAL SHOULDER ARTHROPLASTY Left 07/15/2012   Procedure: LEFT TOTAL SHOULDER ARTHROPLASTY;  Surgeon: Augustin Schooling, MD;  Location: [place];  Service: Orthopedics;  Laterality: Left;   TOTAL SHOULDER REVISION Right 12/21/2014   Procedure: RIGHT SHOULDER POLY EXCHANGE ;  Surgeon: Netta Cedars, MD;  Location: [place];  Service: Orthopedics;  Laterality: Right;   URETEROSCOPY  stent placement    There were no vitals filed for this visit.   Subjective Assessment - 01/22/21 0720     Subjective No new complaints. No falls or pain to report.    Pertinent History Adrenal insufficiency, HTN, wearing 2.5L O2 only at night.    Limitations Walking;Standing;House hold activities    Patient Stated Goals To work on my balance and walking    Currently in Pain? No/denies                [facility] PT Assessment - 01/22/21 0721       Standardized Balance Assessment   Standardized Balance Assessment Berg Balance Test;Timed Up and Go Test      Berg Balance Test   Sit to Stand Able to stand without using hands and stabilize independently    Standing Unsupported Able to stand 2 minutes with supervision    Sitting with Back Unsupported but Feet Supported on Floor or Stool Able to sit safely  and securely 2 minutes    Stand to Sit Sits safely with minimal use of hands    Transfers Able to transfer safely, minor use of hands    Standing Unsupported with Eyes Closed Able to stand 10 seconds with supervision    Standing Unsupported with Feet Together Able to place feet together independently and stand for 1 minute with supervision    From Standing, Reach Forward with Outstretched Arm Can reach forward >12 cm safely (5")   6 inches   From Standing Position, Pick up Object from Floor Able to pick up shoe safely and easily    From Standing  Position, Turn to Look Behind Over each Shoulder Turn sideways only but maintains balance   left>right   Turn 360 Degrees Needs close supervision or verbal cueing    Standing Unsupported, Alternately Place Feet on Step/Stool Able to stand independently and complete 8 steps >20 seconds    Standing Unsupported, One Foot in Front Able to plae foot ahead of the other independently and hold 30 seconds    Standing on One Leg Tries to lift leg/unable to hold 3 seconds but remains standing independently    Total Score 42    Berg comment: 37-45 significant (>80%)      Timed Up and Go Test   TUG Normal TUG    Normal TUG (seconds) 11.47   sec's   Manual TUG (seconds) 15.47   sec's carrying cup of water   Cognitive TUG (seconds) 20.84   sec's counting backwards by 3 from 47                  [facility] Adult PT Treatment/Exercise - 01/22/21 0721       Transfers   Transfers Sit to Stand;Stand to Sit    Sit to Stand 6: Modified independent (Device/Increase time)    Stand to Sit 6: Modified independent (Device/Increase time)      Ambulation/Gait   Ambulation/Gait Yes    Ambulation/Gait Assistance 5: Supervision;4: Min guard    Assistive device None    Gait Pattern Step-through pattern;Decreased stride length;Decreased arm swing - right;Decreased arm swing - left;Trunk flexed;Wide base of support    Ambulation Surface Level;Indoor      Exercises    Exercises Other Exercises    Other Exercises  issued ex's to HEP. Refer to [place] for full details. Min guard to min assist needed. Issued 2 ex's that required min guard assist, did not issue the others for safety.            Issued to HEP this session:  Access Code: [email or web address] URL: [email or web address] Date: 01/22/2021 Prepared by: Willow Ora  Exercises Standing Balance in Corner with Eyes Closed - 1 x daily - 5 x weekly - 1 sets - 3 reps - 15 hold Standing in corner with eyes open - 1 x daily - 5 x weekly - 1 sets - 10 reps        PT Education - 01/22/21 0836     Education Details results of Berg Balance test and TUG's; initial HEP    Person(s) Educated Patient    Methods Explanation;Demonstration;Verbal cues;Handout    Comprehension Verbalized understanding;Returned demonstration;Verbal cues required;Need further instruction              PT Short Term Goals - 01/22/21 0955       PT SHORT TERM GOAL #1   Title Pt will  be IND w/ initial HEP in order to indicate improved functional mobility and dec fall risk.  (Target Date: 02/13/21)    Time 4    Period Weeks    Status On-going    Target Date 02/13/21      PT SHORT TERM GOAL #2   Title Pt will complete BERG balance and improve score by 6 points from baseline in order to indicate dec fall risk.    Baseline 01/22/21: 42/56 scored as baseline    Time 4    Period Weeks    Status On-going      PT SHORT TERM GOAL #3   Title Pt will improve gait speed to >/=1.92 ft/sec with device (  if needed) in order to indicate dec fall risk.    Time 4    Period Weeks    Status On-going      PT SHORT TERM GOAL #4   Title Willl assess appropriateness for use of AD with gait to determine if safe for use at home/in community.    Time 4    Period Weeks    Status On-going      PT SHORT TERM GOAL #5   Title Will perform TUG and write appropriate LTG to reflect dec fall risk.    Baseline 01/22/21: 11.47 sec's no  AD Normal TUG, PT to set goal    Time 4    Period Weeks    Status Partially Met               PT Long Term Goals - 01/22/21 0957       PT LONG TERM GOAL #1   Title Pt will be IND with final HEP in order to indicate improved functional mobility and dec fall risk. (Target Date: 03/15/21)    Time 8    Period Weeks    Status New    Target Date 03/15/21      PT LONG TERM GOAL #2   Title Pt will improve BERG balance score by 10 points from baseline in order to indicate dec fall risk.    Baseline 01/22/21: 42/56 scored as baseline    Time 8    Period Weeks    Status New      PT LONG TERM GOAL #3   Title Pt will ambulate with gait speed >/=2.5 ft/sec with device (if needed) in order to indicate dec fall risk.    Time 8    Period Weeks    Status New      PT LONG TERM GOAL #4   Title Pt will ambulate 300' w/ LRAD over unlevel paved surfaces (with device if needed) at S level in order to indicate improved community safety.    Time 8    Period Weeks    Status New                   Plan - 01/22/21 0720     Clinical Impression Statement Today's skilled session initially focused on completion of Berg Balance Test with score 42/56 and on completion of regular, manual and cognitive TUG. Baselines/goals to be updated on these scores. Remainder of session then focused on establishment of an HEP for balance. The pt has a posterior bias with balance challenges, needing support or assist to correct. The pt is progressing toward goals and should benefit from continued PT to progress toward unmet goals.    Personal Factors and Comorbidities Comorbidity 3+;Time since onset of injury/illness/exacerbation    Comorbidities see above    Examination-Activity Limitations Dressing;Locomotion Level;Squat;Stairs;Stand;Lift;Transfers    Examination-Participation Restrictions Cleaning;Community Activity;Driving;Laundry;Shop;Meal Prep    Stability/Clinical Decision Making Evolving/Moderate  complexity    Rehab Potential Good    PT Frequency 2x / week    PT Duration 8 weeks    PT Treatment/Interventions ADLs/Self Care Home Management;Aquatic Therapy;DME Instruction;Gait training;Stair training;Functional mobility training;Therapeutic activities;Therapeutic exercise;Balance training;Neuromuscular re-education;Patient/family education;Orthotic Fit/Training;Energy conservation;Vestibular    PT Next Visit Plan add to HEP as able countertop ex's- marching, single leg stance, tandem stance, needs hamstring strengthening as well.    PT Home Exercise Plan Access Code: [email or web address]    Consulted and Agree with Plan of Care Patient  Patient will benefit from skilled therapeutic intervention in order to improve the following deficits and impairments:  Abnormal gait, Cardiopulmonary status limiting activity, Decreased activity tolerance, Decreased balance, Decreased coordination, Decreased endurance, Decreased knowledge of use of DME, Decreased mobility, Decreased strength, Difficulty walking, Impaired perceived functional ability, Impaired sensation, Postural dysfunction  Visit Diagnosis: Unsteadiness on feet  Other abnormalities of gait and mobility  Muscle weakness (generalized)     Problem List Patient Active Problem List   Diagnosis Date Noted   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[place]) 10/30/2020   Encounter for immunotherapy 10/30/2020   History of optic neuritis 10/30/2020   Urinary retention 07/17/2020   Encounter for monitoring immunomodulating therapy 05/02/2020   Numbness 08/29/2019   Gait disturbance 08/29/2019   Abnormal brain MRI 08/29/2019   High risk medication use 08/29/2019   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   Adrenal insufficiency (Addison's disease) ([place]) 08/11/2019   S/P left TKA 08/25/2016   S/P total knee replacement 08/25/2016   S/P shoulder replacement 06/29/2014   OSA (obstructive sleep apnea) 01/22/2014   Left ureteral stone 12/18/2013   UTI (urinary  tract infection) 12/18/2013   Morbid obesity ([place]) 08/09/2013   S/P right TKA 08/08/2013   Chronic pain syndrome 03/04/2013   Depressive disorder, not elsewhere classified 03/04/2013   Need for prophylactic vaccination and inoculation against influenza 10/04/2012   Erosive osteoarthritis of multiple sites 10/04/2012   Chronic cough 10/04/2012   Swelling of limb 08/21/2012   Essential hypertension, benign 08/21/2012   Preoperative general physical examination 06/12/2012   Restless leg syndrome 06/12/2012   Anxiety state, unspecified 06/12/2012   Lung nodule 07/16/2011   Cough 04/21/2011   Wolff-Parkinson-White (WPW) syndrome    Osteoarthritis    Hyperlipidemia    IBS (irritable bowel syndrome)    Kidney stones    Willow Ora, PTA, [facility] [address] [place], [place] [address] [phone] 01/22/21, 10:01 AM   Name: Connie Haynes MRN: [id number] Date of Birth: 1956-01-20

## 2021-01-22 NOTE — Patient Instructions (Signed)
Access Code: [email or web address] URL: [email or web address] Date: 01/22/2021 Prepared by: Willow Ora  Exercises Standing Balance in Corner with Eyes Closed - 1 x daily - 5 x weekly - 1 sets - 3 reps - 15 hold Standing in corner with eyes open - 1 x daily - 5 x weekly - 1 sets - 10 reps

## 2021-01-30 ENCOUNTER — Encounter: Payer: Self-pay | Admitting: Physical Therapy

## 2021-01-30 ENCOUNTER — Other Ambulatory Visit: Payer: Self-pay

## 2021-01-30 ENCOUNTER — Ambulatory Visit: Payer: Medicare Other | Admitting: Physical Therapy

## 2021-01-30 DIAGNOSIS — R208 Other disturbances of skin sensation: Secondary | ICD-10-CM

## 2021-01-30 DIAGNOSIS — M6281 Muscle weakness (generalized): Secondary | ICD-10-CM

## 2021-01-30 DIAGNOSIS — R2681 Unsteadiness on feet: Secondary | ICD-10-CM | POA: Diagnosis not present

## 2021-01-30 DIAGNOSIS — R2689 Other abnormalities of gait and mobility: Secondary | ICD-10-CM

## 2021-01-30 DIAGNOSIS — R296 Repeated falls: Secondary | ICD-10-CM

## 2021-01-30 NOTE — Therapy (Signed)
[facility] [address] [place], [place], [address] Phone: [phone]   Fax:  [phone]  Physical Therapy Treatment  Patient Details  Name: Connie Haynes MRN: [id number] Date of Birth: 07-31-1955 Referring Provider (PT): Arlice Colt, MD   Encounter Date: 01/30/2021   PT End of Session - 01/30/21 0723     Visit Number 3    Number of Visits 17    Date for PT Re-Evaluation 03/15/21    Authorization Type [employer] Medicare (10th visit PN needed)    Progress Note Due on Visit 10    PT Start Time 0719    PT Stop Time 0800    PT Time Calculation (min) 41 min    Equipment Utilized During Treatment Gait belt    Activity Tolerance Patient tolerated treatment well    Behavior During Therapy WFL for tasks assessed/performed             Past Medical History:  Diagnosis Date   Adrenal insufficiency ([place])    Anemia    denies   Anxiety    Basal cell carcinoma    eye lid   Chronic kidney disease, stage 3 (HCC)    Cough    hx. dry cough chronic   Disorder of tear duct system    frequent tearing of eyes   GERD (gastroesophageal reflux disease)    H/O: whooping cough 2000   History of kidney infection 2015   ? sepsis   History of kidney stones    History of kidney stones    Hyperlipidemia    Hypertension    IBS (irritable bowel syndrome)    controlled with med   Osteoarthritis    arthritis -joints-limited ROM shoulders more on right   PONV (postoperative nausea and vomiting)     per pt hx of low BP following shoulder surgery in 05-2015; was managed in hospital with IV fluids and d/c BP being taken a tthe time; pt was dced and F/U with endcrinoopgist who dx with adrenal insurffiency and started steroid therapy; treatmetnt with positive outome, now managed chronically on hydrocortisone tablets;   Sleep apnea    no longer using cpap   Stroke [facility])    per patient "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 Hulen Skains)   [place]     '93 for WPW syndrome   CARPAL TUNNEL RELEASE Right 12/21/2014   Procedure: RIGHT CARPAL TUNNEL RELEASE ;  Surgeon: Netta Cedars, MD;  Location: [place] [facility];  Service: Orthopedics;  Laterality: Right;   CARPAL TUNNEL RELEASE Left    CERVICAL SPINE SURGERY     cervical fusion with hardware retained   COLON SURGERY     "colon detached from abdomen"   COLONOSCOPY WITH PROPOFOL N/A 05/10/2014   Procedure: COLONOSCOPY WITH PROPOFOL;  Surgeon: Juanita Craver, MD;  Location: [facility];  Service: Endoscopy;  Laterality: N/A;   CYSTOSCOPY/RETROGRADE/URETEROSCOPY Left 12/18/2013   Procedure: CYSTOSCOPY/RETROGRADE/LEFT STENT;  Surgeon: Malka So, MD;  Location: [facility];  Service: Urology;  Laterality: Left;   ESOPHAGOGASTRODUODENOSCOPY (EGD) WITH PROPOFOL N/A 05/10/2014   Procedure: ESOPHAGOGASTRODUODENOSCOPY (EGD) WITH PROPOFOL;  Surgeon: Juanita Craver, MD;  [facility]: [facility];  Service: Endoscopy;  Laterality: N/A;   EYELID CARCINOMA EXCISION     INSERTION OF DIALYSIS CATHETER N/A 08/16/2019   Procedure: INSERTION OF DIALYSIS CATHETER;  Surgeon: Garner Nash, DO;  Location: [facility];  Service: Pulmonary;  Laterality: N/A;   JOINT REPLACEMENT     KNEE ARTHROSCOPY Left 07/16/2016   REVERSE SHOULDER ARTHROPLASTY Right 06/29/2014   Procedure: REVERSE TOTAL SHOULDER ARTHROPLASTY;  Surgeon: Netta Cedars, MD;  Location: [facility];  Service: Orthopedics;  Laterality: Right;   REVISION TOTAL SHOULDER TO REVERSE TOTAL SHOULDER Left  05/15/2015   REVISION TOTAL SHOULDER TO REVERSE TOTAL SHOULDER Left 05/15/2015   Procedure: LEFT SHOULDER REVISION TOTAL SHOULDER ARTHRPLASTY TO REVERSE TOTAL SHOULDER ARTHROPLASTY ;  Surgeon: Netta Cedars, MD;  Location: [facility];  Service: Orthopedics;  Laterality: Left;   tear duct surgery     TOTAL ABDOMINAL HYSTERECTOMY     TOTAL HIP ARTHROPLASTY     x 3(rt x2, lt x1)   TOTAL KNEE ARTHROPLASTY Right 08/08/2013   Procedure: RIGHT TOTAL KNEE ARTHROPLASTY WITH SAPHENOUS NERVE SCAR EXCISION;  Surgeon: Mauri Pole, MD;  Location[facility];  Service: Orthopedics;  Laterality: Right;   TOTAL KNEE ARTHROPLASTY Left 08/25/2016   Procedure: LEFT TOTAL KNEE ARTHROPLASTY;  Surgeon: Paralee Cancel, MD;  Location[facility];  Service: Orthopedics;  Laterality: Left;  70 mins; Adductor block   TOTAL SHOULDER ARTHROPLASTY Left 07/15/2012   Procedure: LEFT TOTAL SHOULDER ARTHROPLASTY;  Surgeon: Augustin Schooling, MD;  Location: [facility];  Service: Orthopedics;  Laterality: Left;   TOTAL SHOULDER REVISION Right 12/21/2014   Procedure: RIGHT SHOULDER POLY EXCHANGE ;  Surgeon: Netta Cedars, MD;  Location: [facility];  Service: Orthopedics;  Laterality: Right;   URETEROSCOPY  stent placement    There were no vitals filed for this visit.   Subjective Assessment - 01/30/21 0722     Subjective No new complaints. No falls, a couple of close calls. Looking to place grab bars in bathroom shower stall. No pain.    Pertinent History Adrenal insufficiency, HTN, wearing 2.5L O2 only at night.    Limitations Walking;Standing;House hold activities    Patient Stated Goals To work on my balance and walking    Currently in Pain? No/denies                    [facility] Adult PT Treatment/Exercise - 01/30/21 0724       Transfers   Transfers Sit to Stand;Stand to Sit    Sit to Stand 6: Modified independent (Device/Increase time)    Stand to Sit 6: Modified independent (Device/Increase time)      Ambulation/Gait   Ambulation/Gait  Yes    Ambulation/Gait Assistance 5: Supervision;4: Min guard    Ambulation Distance (Feet) --   around clinic with session   Assistive device None    Gait Pattern Step-through pattern;Decreased stride length;Decreased arm swing - right;Decreased arm swing - left;Trunk flexed;Wide base of support    Ambulation Surface Level;Indoor      Exercises   Exercises Other Exercises    Other Exercises  added additional ex's to HEP for balance. Refer to [place] program for full details.      Knee/Hip Exercises: Aerobic   Other Aerobic Scifit UE/LE's level 2.5 x 8 minutes with goal >/= 60 steps  per minute for strengthening and activitiy tolerance      Knee/Hip Exercises: Seated   Heel Slides AROM;Strengthening;Both;1 set;5 reps;Limitations    Heel Slides Limitations red band resistance with foot on pillowcase to decrease friction, cues for slow and controlled movements.            issued to HEP this session: Access Code: [email or web address] URL: [email or web address] Date: 01/30/2021 Prepared by: Willow Ora  Exercises Standing Balance in Corner with Eyes Closed - 1 x daily - 5 x weekly - 1 sets - 3 reps - 15 hold Standing in corner with eyes open - 1 x daily - 5 x weekly - 1 sets - 10 reps  Added today: Standing March with Counter Support - 1 x daily - 5 x weekly - 1 sets - 10 reps Standing Single Leg Stance with Counter Support - 1 x daily - 5 x weekly - 1 sets - 3 reps - 10 hold Side Stepping with Counter Support - 1 x daily - 5 x weekly - 1 sets - 3 reps        PT Education - 01/30/21 0802     Education Details additional ex's to HEP for balance    Person(s) Educated Patient    Methods Explanation;Demonstration;Verbal cues;Handout    Comprehension Verbalized understanding;Returned demonstration;Verbal cues required;Need further instruction              PT Short Term Goals - 01/22/21 0955       PT SHORT TERM GOAL #1   Title Pt will  be IND w/ initial HEP in  order to indicate improved functional mobility and dec fall risk.  (Target Date: 02/13/21)    Time 4    Period Weeks    Status On-going    Target Date 02/13/21      PT SHORT TERM GOAL #2   Title Pt will complete BERG balance and improve score by 6 points from baseline in order to indicate dec fall risk.    Baseline 01/22/21: 42/56 scored as baseline    Time 4    Period Weeks    Status On-going      PT SHORT TERM GOAL #3   Title Pt will improve gait speed to >/=1.92 ft/sec with device (if needed) in order to indicate dec fall risk.    Time 4    Period Weeks    Status On-going      PT SHORT TERM GOAL #4   Title Willl assess appropriateness for use of AD with gait to determine if safe for use at home/in community.    Time 4    Period Weeks    Status On-going      PT SHORT TERM GOAL #5   Title Will perform TUG and write appropriate LTG to reflect dec fall risk.    Baseline 01/22/21: 11.47 sec's no AD Normal TUG, PT to set goal    Time 4    Period Weeks    Status Partially Met               PT Long Term Goals - 01/22/21 0957       PT LONG TERM GOAL #1   Title Pt will be IND with final HEP in order to indicate improved functional mobility and dec fall risk. (Target Date: 03/15/21)    Time 8    Period Weeks    Status New    Target Date 03/15/21      PT LONG TERM GOAL #  2   Title Pt will improve BERG balance score by 10 points from baseline in order to indicate dec fall risk.    Baseline 01/22/21: 42/56 scored as baseline    Time 8    Period Weeks    Status New      PT LONG TERM GOAL #3   Title Pt will ambulate with gait speed >/=2.5 ft/sec with device (if needed) in order to indicate dec fall risk.    Time 8    Period Weeks    Status New      PT LONG TERM GOAL #4   Title Pt will ambulate 300' w/ LRAD over unlevel paved surfaces (with device if needed) at S level in order to indicate improved community safety.    Time 8    Period Weeks    Status New                    Plan - 01/30/21 0723     Clinical Impression Statement Today's skilled session initially focused on additon of ex's to HEP for balance training with no issues noted or reported with performance in session. Remainder of session continued to focus on strengthening with rest breaks taken as needed due to LE fatigue. No other issues noted or reported. The pt is making steady progress toward goals and should benefit from continued PT to progress toward unmet goals.    Personal Factors and Comorbidities Comorbidity 3+;Time since onset of injury/illness/exacerbation    Comorbidities see above    Examination-Activity Limitations Dressing;Locomotion Level;Squat;Stairs;Stand;Lift;Transfers    Examination-Participation Restrictions Cleaning;Community Activity;Driving;Laundry;Shop;Meal Prep    Stability/Clinical Decision Making Evolving/Moderate complexity    Rehab Potential Good    PT Frequency 2x / week    PT Duration 8 weeks    PT Treatment/Interventions ADLs/Self Care Home Management;Aquatic Therapy;DME Instruction;Gait training;Stair training;Functional mobility training;Therapeutic activities;Therapeutic exercise;Balance training;Neuromuscular re-education;Patient/family education;Orthotic Fit/Training;Energy conservation;Vestibular    PT Next Visit Plan continue to work on strengthening and balance training    PT Home Exercise Plan Access Code: [email or web address]    [email or web address] and Agree with Plan of Care Patient             Patient will benefit from skilled therapeutic intervention in order to improve the following deficits and impairments:  Abnormal gait, Cardiopulmonary status limiting activity, Decreased activity tolerance, Decreased balance, Decreased coordination, Decreased endurance, Decreased knowledge of use of DME, Decreased mobility, Decreased strength, Difficulty walking, Impaired perceived functional ability, Impaired sensation, Postural dysfunction  Visit  Diagnosis: Unsteadiness on feet  Other abnormalities of gait and mobility  Muscle weakness (generalized)  Other disturbances of skin sensation  Repeated falls     Problem List Patient Active Problem List   Diagnosis Date Noted   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[place]) 10/30/2020   Encounter for immunotherapy 10/30/2020   History of optic neuritis 10/30/2020   Urinary retention 07/17/2020   Encounter for monitoring immunomodulating therapy 05/02/2020   Numbness 08/29/2019   Gait disturbance 08/29/2019   Abnormal brain MRI 08/29/2019   High risk medication use 08/29/2019   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   Adrenal insufficiency (Addison's disease) ([place]) 08/11/2019   S/P left TKA 08/25/2016   S/P total knee replacement 08/25/2016   S/P shoulder replacement 06/29/2014   OSA (obstructive sleep apnea) 01/22/2014   Left ureteral stone 12/18/2013   UTI (urinary tract infection) 12/18/2013   Morbid obesity ([place]) 08/09/2013   S/P right TKA 08/08/2013   Chronic pain syndrome 03/04/2013   Depressive disorder, not  elsewhere classified 03/04/2013   Need for prophylactic vaccination and inoculation against influenza 10/04/2012   Erosive osteoarthritis of multiple sites 10/04/2012   Chronic cough 10/04/2012   Swelling of limb 08/21/2012   Essential hypertension, benign 08/21/2012   Preoperative general physical examination 06/12/2012   Restless leg syndrome 06/12/2012   Anxiety state, unspecified 06/12/2012   Lung nodule 07/16/2011   Cough 04/21/2011   Wolff-Parkinson-White (WPW) syndrome    Osteoarthritis    Hyperlipidemia    IBS (irritable bowel syndrome)    Kidney stones     Willow Ora, PTA, [facility] [address] [place], [place] [address] [phone] 01/30/21, 8:07 AM   Name: Connie Haynes MRN: [id number] Date of Birth: 1955/08/21

## 2021-01-30 NOTE — Patient Instructions (Signed)
Access Code: [email or web address] URL: [email or web address] Date: 01/30/2021 Prepared by: Willow Ora  Exercises Standing Balance in Corner with Eyes Closed - 1 x daily - 5 x weekly - 1 sets - 3 reps - 15 hold Standing in corner with eyes open - 1 x daily - 5 x weekly - 1 sets - 10 reps Standing March with Counter Support - 1 x daily - 5 x weekly - 1 sets - 10 reps Standing Single Leg Stance with Counter Support - 1 x daily - 5 x weekly - 1 sets - 3 reps - 10 hold Side Stepping with Counter Support - 1 x daily - 5 x weekly - 1 sets - 3 reps

## 2021-02-05 ENCOUNTER — Ambulatory Visit: Payer: Medicare Other | Admitting: Physical Therapy

## 2021-02-06 ENCOUNTER — Other Ambulatory Visit: Payer: Self-pay

## 2021-02-06 ENCOUNTER — Encounter: Payer: Self-pay | Admitting: Physical Therapy

## 2021-02-06 ENCOUNTER — Ambulatory Visit: Payer: Medicare Other | Admitting: Physical Therapy

## 2021-02-06 DIAGNOSIS — R2681 Unsteadiness on feet: Secondary | ICD-10-CM

## 2021-02-06 DIAGNOSIS — R2689 Other abnormalities of gait and mobility: Secondary | ICD-10-CM

## 2021-02-06 DIAGNOSIS — M6281 Muscle weakness (generalized): Secondary | ICD-10-CM

## 2021-02-06 NOTE — Therapy (Signed)
[facility] [address] [place], [place], [address] Phone: [phone]   Fax:  [phone]  Physical Therapy Treatment  Patient Details  Name: Connie Haynes MRN: [id number] Date of Birth: Jan 31, 1956 Referring Provider (PT): Arlice Colt, MD   Encounter Date: 02/06/2021   PT End of Session - 02/06/21 1104     Visit Number 4    Number of Visits 17    Date for PT Re-Evaluation 03/15/21    Authorization Type [employer] Medicare (10th visit PN needed)    Progress Note Due on Visit 10    PT Start Time 1100    PT Stop Time 1143    PT Time Calculation (min) 43 min    Equipment Utilized During Treatment Gait belt    Activity Tolerance Patient tolerated treatment well    Behavior During Therapy WFL for tasks assessed/performed             Past Medical History:  Diagnosis Date   Adrenal insufficiency ([place])    Anemia    denies   Anxiety    Basal cell carcinoma    eye lid   Chronic kidney disease, stage 3 (HCC)    Cough    hx. dry cough chronic   Disorder of tear duct system    frequent tearing of eyes   GERD (gastroesophageal reflux disease)    H/O: whooping cough 2000   History of kidney infection 2015   ? sepsis   History of kidney stones    History of kidney stones    Hyperlipidemia    Hypertension    IBS (irritable bowel syndrome)    controlled with med   Osteoarthritis    arthritis -joints-limited ROM shoulders more on right   PONV (postoperative nausea and vomiting)     per pt hx of low BP following shoulder surgery in 05-2015; was managed in hospital with IV fluids and d/c BP being taken a tthe time; pt was dced and F/U with endcrinoopgist who dx with adrenal insurffiency and started steroid therapy; treatmetnt with positive outome, now managed chronically on hydrocortisone tablets;   Sleep apnea    no longer using cpap   Stroke [facility])    per patient "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 Hulen Skains)   [place]     '93 for WPW syndrome   CARPAL TUNNEL RELEASE Right 12/21/2014   Procedure: RIGHT CARPAL TUNNEL RELEASE ;  Surgeon: Netta Cedars, MD;  Location: [place];  Service: Orthopedics;  Laterality: Right;   CARPAL TUNNEL RELEASE Left    CERVICAL SPINE SURGERY     cervical fusion with hardware retained   COLON SURGERY     "colon detached from abdomen"   COLONOSCOPY WITH PROPOFOL N/A 05/10/2014   Procedure: COLONOSCOPY WITH PROPOFOL;  Surgeon: Juanita Craver, MD;  Location: [facility];  Service: Endoscopy;  Laterality: N/A;   CYSTOSCOPY/RETROGRADE/URETEROSCOPY Left 12/18/2013   Procedure: CYSTOSCOPY/RETROGRADE/LEFT STENT;  Surgeon: Malka So, MD;  Location: [facility];  Service: Urology;  Laterality: Left;   ESOPHAGOGASTRODUODENOSCOPY (EGD) WITH PROPOFOL N/A 05/10/2014   Procedure: ESOPHAGOGASTRODUODENOSCOPY (EGD) WITH PROPOFOL;  Surgeon: Juanita Craver, MD;  Location: [facility];  Service: Endoscopy;  Laterality: N/A;   EYELID CARCINOMA EXCISION     INSERTION OF DIALYSIS CATHETER N/A 08/16/2019   Procedure: INSERTION OF DIALYSIS CATHETER;  Surgeon: Garner Nash, DO;  Location: [place];  Service: Pulmonary;  Laterality: N/A;   JOINT REPLACEMENT     KNEE ARTHROSCOPY Left 07/16/2016   REVERSE SHOULDER ARTHROPLASTY Right 06/29/2014   Procedure: REVERSE TOTAL SHOULDER ARTHROPLASTY;  Surgeon: Netta Cedars, MD;  Location: [place];  Service: Orthopedics;  Laterality: Right;   REVISION TOTAL SHOULDER TO REVERSE TOTAL SHOULDER Left  05/15/2015   REVISION TOTAL SHOULDER TO REVERSE TOTAL SHOULDER Left 05/15/2015   Procedure: LEFT SHOULDER REVISION TOTAL SHOULDER ARTHRPLASTY TO REVERSE TOTAL SHOULDER ARTHROPLASTY ;  Surgeon: Netta Cedars, MD;  Location: [place];  Service: Orthopedics;  Laterality: Left;   tear duct surgery     TOTAL ABDOMINAL HYSTERECTOMY     TOTAL HIP ARTHROPLASTY     x 3(rt x2, lt x1)   TOTAL KNEE ARTHROPLASTY Right 08/08/2013   Procedure: RIGHT TOTAL KNEE ARTHROPLASTY WITH SAPHENOUS NERVE SCAR EXCISION;  Surgeon: Mauri Pole, MD;  [facility];  Service: Orthopedics;  Laterality: Right;   TOTAL KNEE ARTHROPLASTY Left 08/25/2016   Procedure: LEFT TOTAL KNEE ARTHROPLASTY;  Surgeon: Paralee Cancel, MD;  Location[facility];  Service: Orthopedics;  Laterality: Left;  70 mins; Adductor block   TOTAL SHOULDER ARTHROPLASTY Left 07/15/2012   Procedure: LEFT TOTAL SHOULDER ARTHROPLASTY;  Surgeon: Augustin Schooling, MD;  Location: [place];  Service: Orthopedics;  Laterality: Left;   TOTAL SHOULDER REVISION Right 12/21/2014   Procedure: RIGHT SHOULDER POLY EXCHANGE ;  Surgeon: Netta Cedars, MD;  Location: [place];  Service: Orthopedics;  Laterality: Right;   URETEROSCOPY  stent placement    There were no vitals filed for this visit.   Subjective Assessment - 02/06/21 1104     Subjective No new complaints. No falls or  pain.    Pertinent History Adrenal insufficiency, HTN, wearing 2.5L O2 only at night.    Limitations Walking;Standing;House hold activities    Patient Stated Goals To work on my balance and walking    Currently in Pain? No/denies                   [facility] Adult PT Treatment/Exercise - 02/06/21 1105       Transfers   Transfers Sit to Stand;Stand to Sit    Sit to Stand 6: Modified independent (Device/Increase time)    Stand to Sit 6: Modified independent (Device/Increase time)      Ambulation/Gait   Ambulation/Gait Yes    Ambulation/Gait Assistance 5: Supervision;4: Min guard;4: Min assist     Ambulation/Gait Assistance Details working on gait with forward gaze vs looking down at feet- 1st lap with walker with min guard to min assist for balance. pt with improved step length and heel>toe step progression. no issues with kicking walker noted. pt did have a few episodes of "freezing up" where she would go stiff and lift entire walker up off floor. 2cd lap with right HHA with min guard to min assist for balance. continued to have good step length and heel>toe step progression.    Ambulation Distance (Feet) 115 Feet   x1 with RW, x1 with HHA, plus around clinic with session   Assistive  device None;Rolling walker    Gait Pattern Step-through pattern;Decreased stride length;Decreased arm swing - right;Decreased arm swing - left;Trunk flexed;Wide base of support    Ambulation Surface Level;Indoor      Knee/Hip Exercises: Aerobic   Other Aerobic Scifit UE/LE's level 3.0 x 8 minutes with goal >/= 60 steps per minute for strengthening and activitiy tolerance      Knee/Hip Exercises: Seated   Long Arc Quad AROM;Strengthening;Both;1 set;10 reps;Limitations    Long Arc Quad Weight 2 lbs.    Long [employer] Limitations cues for slow and controlled movements    Heel Slides AROM;Strengthening;Both;1 set;5 reps;Limitations    Heel Slides Limitations red band resistance with foot on pillowcase to decrease friction, cues for slow and controlled movements.                       PT Short Term Goals - 01/22/21 0955       PT SHORT TERM GOAL #1   Title Pt will  be IND w/ initial HEP in order to indicate improved functional mobility and dec fall risk.  (Target Date: 02/13/21)    Time 4    Period Weeks    Status On-going    Target Date 02/13/21      PT SHORT TERM GOAL #2   Title Pt will complete BERG balance and improve score by 6 points from baseline in order to indicate dec fall risk.    Baseline 01/22/21: 42/56 scored as baseline    Time 4    Period Weeks    Status On-going      PT  SHORT TERM GOAL #3   Title Pt will improve gait speed to >/=1.92 ft/sec with device (if needed) in order to indicate dec fall risk.    Time 4    Period Weeks    Status On-going      PT SHORT TERM GOAL #4   Title Willl assess appropriateness for use of AD with gait to determine if safe for use at home/in community.    Time 4    Period Weeks    Status On-going      PT SHORT TERM GOAL #5   Title Will perform TUG and write appropriate LTG to reflect dec fall risk.    Baseline 01/22/21: 11.47 sec's no AD Normal TUG, PT to set goal    Time 4    Period Weeks    Status Partially Met               PT Long Term Goals - 01/22/21 0957       PT LONG TERM GOAL #1   Title Pt will be IND with final HEP in order to indicate improved functional mobility and dec fall risk. (Target Date: 03/15/21)    Time 8    Period Weeks    Status New    Target Date 03/15/21      PT LONG TERM GOAL #2   Title Pt will improve BERG balance score by 10 points from baseline in order to indicate dec fall risk.    Baseline 01/22/21: 42/56 scored as baseline    Time 8    Period Weeks    Status New      PT LONG TERM GOAL #3   Title Pt will ambulate with gait speed >/=2.5 ft/sec with device (if needed) in order to indicate dec fall risk.    Time 8    Period Weeks  Status New      PT LONG TERM GOAL #4   Title Pt will ambulate 300' w/ LRAD over unlevel paved surfaces (with device if needed) at S level in order to indicate improved community safety.    Time 8    Period Weeks    Status New                   Plan - 02/06/21 1104     Clinical Impression Statement Today's skilled session continued to focus on LE strengthening with no issues noted or reported by patient in session. Also began to address gait with emphasis on forward gaze vs lookign at feet. Pt with imbalance with use of walker with gait. Will need continued work on this to determine most appropriate device for gait safety. The pt is  making progress and should benefit from continued PT to progress toward unmet goals.    Personal Factors and Comorbidities Comorbidity 3+;Time since onset of injury/illness/exacerbation    Comorbidities see above    Examination-Activity Limitations Dressing;Locomotion Level;Squat;Stairs;Stand;Lift;Transfers    Examination-Participation Restrictions Cleaning;Community Activity;Driving;Laundry;Shop;Meal Prep    Stability/Clinical Decision Making Evolving/Moderate complexity    Rehab Potential Good    PT Frequency 2x / week    PT Duration 8 weeks    PT Treatment/Interventions ADLs/Self Care Home Management;Aquatic Therapy;DME Instruction;Gait training;Stair training;Functional mobility training;Therapeutic activities;Therapeutic exercise;Balance training;Neuromuscular re-education;Patient/family education;Orthotic Fit/Training;Energy conservation;Vestibular    PT Next Visit Plan continue to work on strengthening and balance training    PT Home Exercise Plan Access Code: [email or web address]    Consulted and Agree with Plan of Care Patient             Patient will benefit from skilled therapeutic intervention in order to improve the following deficits and impairments:  Abnormal gait, Cardiopulmonary status limiting activity, Decreased activity tolerance, Decreased balance, Decreased coordination, Decreased endurance, Decreased knowledge of use of DME, Decreased mobility, Decreased strength, Difficulty walking, Impaired perceived functional ability, Impaired sensation, Postural dysfunction  Visit Diagnosis: Unsteadiness on feet  Other abnormalities of gait and mobility  Muscle weakness (generalized)     Problem List Patient Active Problem List   Diagnosis Date Noted   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[place]) 10/30/2020   Encounter for immunotherapy 10/30/2020   History of optic neuritis 10/30/2020   Urinary retention 07/17/2020   Encounter for monitoring immunomodulating therapy 05/02/2020   Numbness  08/29/2019   Gait disturbance 08/29/2019   Abnormal brain MRI 08/29/2019   High risk medication use 08/29/2019   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   Adrenal insufficiency (Addison's disease) ([place]) 08/11/2019   S/P left TKA 08/25/2016   S/P total knee replacement 08/25/2016   S/P shoulder replacement 06/29/2014   OSA (obstructive sleep apnea) 01/22/2014   Left ureteral stone 12/18/2013   UTI (urinary tract infection) 12/18/2013   Morbid obesity ([place]) 08/09/2013   S/P right TKA 08/08/2013   Chronic pain syndrome 03/04/2013   Depressive disorder, not elsewhere classified 03/04/2013   Need for prophylactic vaccination and inoculation against influenza 10/04/2012   Erosive osteoarthritis of multiple sites 10/04/2012   Chronic cough 10/04/2012   Swelling of limb 08/21/2012   Essential hypertension, benign 08/21/2012   Preoperative general physical examination 06/12/2012   Restless leg syndrome 06/12/2012   Anxiety state, unspecified 06/12/2012   Lung nodule 07/16/2011   Cough 04/21/2011   Wolff-Parkinson-White (WPW) syndrome    Osteoarthritis    Hyperlipidemia    IBS (irritable bowel syndrome)    Kidney stones  Willow Ora, PTA, [facility] [address] [place], [place] [address] [phone] 02/06/21, 4:32 PM   Name: Connie Haynes MRN: [id number] Date of Birth: February 17, 1955

## 2021-02-07 ENCOUNTER — Encounter: Payer: Self-pay | Admitting: Physical Therapy

## 2021-02-07 ENCOUNTER — Ambulatory Visit: Payer: Medicare Other | Admitting: Physical Therapy

## 2021-02-07 DIAGNOSIS — R2681 Unsteadiness on feet: Secondary | ICD-10-CM

## 2021-02-07 DIAGNOSIS — R2689 Other abnormalities of gait and mobility: Secondary | ICD-10-CM

## 2021-02-07 DIAGNOSIS — M6281 Muscle weakness (generalized): Secondary | ICD-10-CM

## 2021-02-07 DIAGNOSIS — R296 Repeated falls: Secondary | ICD-10-CM

## 2021-02-07 DIAGNOSIS — R208 Other disturbances of skin sensation: Secondary | ICD-10-CM

## 2021-02-07 NOTE — Therapy (Signed)
[facility] [address] [place], [place], [address] Phone: [phone]   Fax:  [phone]  Physical Therapy Treatment  Patient Details  Name: Connie Haynes MRN: [id number] Date of Birth: [birth year] Referring Provider (PT): Arlice Colt, MD   Encounter Date: 02/07/2021   PT End of Session - 02/07/21 0722     Visit Number 5    Number of Visits 17    Date for PT Re-Evaluation 03/15/21    Authorization Type [employer] Medicare (10th visit PN needed)    Progress Note Due on Visit 10    PT Start Time 0719    PT Stop Time 0800    PT Time Calculation (min) 41 min    Equipment Utilized During Treatment Gait belt    Activity Tolerance Patient tolerated treatment well    Behavior During Therapy WFL for tasks assessed/performed             Past Medical History:  Diagnosis Date   Adrenal insufficiency ([place])    Anemia    denies   Anxiety    Basal cell carcinoma    eye lid   Chronic kidney disease, stage 3 (HCC)    Cough    hx. dry cough chronic   Disorder of tear duct system    frequent tearing of eyes   GERD (gastroesophageal reflux disease)    H/O: whooping cough 2000   History of kidney infection 2015   ? sepsis   History of kidney stones    History of kidney stones    Hyperlipidemia    Hypertension    IBS (irritable bowel syndrome)    controlled with med   Osteoarthritis    arthritis -joints-limited ROM shoulders more on right   PONV (postoperative nausea and vomiting)     per pt hx of low BP following shoulder surgery in 05-2015; was managed in hospital with IV fluids and d/c BP being taken a tthe time; pt was dced and F/U with endcrinoopgist who dx with adrenal insurffiency and started steroid therapy; treatmetnt with positive outome, now managed chronically on hydrocortisone tablets;   Sleep apnea    no longer using cpap   Stroke [facility])    per patient "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 Hulen Skains)   [place]     '93 for WPW syndrome   CARPAL TUNNEL RELEASE Right 12/21/2014   Procedure: RIGHT CARPAL TUNNEL RELEASE ;  Surgeon: Netta Cedars, MD;  Location: [place];  Service: Orthopedics;  Laterality: Right;   CARPAL TUNNEL RELEASE Left    CERVICAL SPINE SURGERY     cervical fusion with hardware retained   COLON SURGERY     "colon detached from abdomen"   COLONOSCOPY WITH PROPOFOL N/A 05/10/2014   Procedure: COLONOSCOPY WITH PROPOFOL;  Surgeon: Juanita Craver, MD;  Location: [facility];  Service: Endoscopy;  Laterality: N/A;   CYSTOSCOPY/RETROGRADE/URETEROSCOPY Left 12/18/2013   Procedure: CYSTOSCOPY/RETROGRADE/LEFT STENT;  Surgeon: Malka So, MD;  Location: [facility];  Service: Urology;  Laterality: Left;   ESOPHAGOGASTRODUODENOSCOPY (EGD) WITH PROPOFOL N/A 05/10/2014   Procedure: ESOPHAGOGASTRODUODENOSCOPY (EGD) WITH PROPOFOL;  Surgeon: Juanita Craver, MD;  [facility];  Service: Endoscopy;  Laterality: N/A;   EYELID CARCINOMA EXCISION     INSERTION OF DIALYSIS CATHETER N/A 08/16/2019   Procedure: INSERTION OF DIALYSIS CATHETER;  Surgeon: Garner Nash, DO;  Location: [facility];  Service: Pulmonary;  Laterality: N/A;   JOINT REPLACEMENT     KNEE ARTHROSCOPY Left 07/16/2016   REVERSE SHOULDER ARTHROPLASTY Right 06/29/2014   Procedure: REVERSE TOTAL SHOULDER ARTHROPLASTY;  Surgeon: Netta Cedars, MD;  Location: [facility];  Service: Orthopedics;  Laterality: Right;   REVISION TOTAL SHOULDER TO REVERSE TOTAL SHOULDER Left  05/15/2015   REVISION TOTAL SHOULDER TO REVERSE TOTAL SHOULDER Left 05/15/2015   Procedure: LEFT SHOULDER REVISION TOTAL SHOULDER ARTHRPLASTY TO REVERSE TOTAL SHOULDER ARTHROPLASTY ;  Surgeon: Netta Cedars, MD;  Location: [facility];  Service: Orthopedics;  Laterality: Left;   tear duct surgery     TOTAL ABDOMINAL HYSTERECTOMY     TOTAL HIP ARTHROPLASTY     x 3(rt x2, lt x1)   TOTAL KNEE ARTHROPLASTY Right 08/08/2013   Procedure: RIGHT TOTAL KNEE ARTHROPLASTY WITH SAPHENOUS NERVE SCAR EXCISION;  Surgeon: Mauri Pole, MD;  [facility];  Service: Orthopedics;  Laterality: Right;   TOTAL KNEE ARTHROPLASTY Left 08/25/2016   Procedure: LEFT TOTAL KNEE ARTHROPLASTY;  Surgeon: Paralee Cancel, MD;  Location[facility];  Service: Orthopedics;  Laterality: Left;  70 mins; Adductor block   TOTAL SHOULDER ARTHROPLASTY Left 07/15/2012   Procedure: LEFT TOTAL SHOULDER ARTHROPLASTY;  Surgeon: Augustin Schooling, MD;  Location: [place];  Service: Orthopedics;  Laterality: Left;   TOTAL SHOULDER REVISION Right 12/21/2014   Procedure: RIGHT SHOULDER POLY EXCHANGE ;  Surgeon: Netta Cedars, MD;  Location: [facility];  Service: Orthopedics;  Laterality: Right;   URETEROSCOPY  stent placement    There were no vitals filed for this visit.   Subjective Assessment - 02/07/21 0720     Subjective No new complaints. No falls. Having some left upper trap pain from being tense with holding the walker yesterday. Has not done anything to address the pain, will use heat and a pain med roll on when she gets home.    Pertinent History Adrenal insufficiency, HTN, wearing 2.5L O2 only at night.    Limitations Walking;Standing;House hold activities    Patient Stated Goals To work on my balance and walking    Currently in Pain? Yes    Pain Score 5     Pain Location Shoulder    Pain Orientation Left    Pain Descriptors / Indicators Aching;Sore;Tightness    Pain Type Acute pain    Pain Onset Yesterday    Pain Frequency Constant     Aggravating Factors  tensing muscle when trying to hold walker    Pain Relieving Factors planning to use head and medicated roll on when she gets home                               [facility] Adult PT Treatment/Exercise - 02/07/21 0722       Transfers   Transfers Sit to Stand;Stand to Sit    Sit to Stand 6: Modified independent (Device/Increase time)    Stand to Sit 6: Modified independent (Device/Increase time)      Ambulation/Gait   Ambulation/Gait Yes  Ambulation/Gait Assistance 5: Supervision;4: Min guard    Ambulation Distance (Feet) --   around clinic with session   Assistive device None    Gait Pattern Step-through pattern;Decreased stride length;Decreased arm swing - right;Decreased arm swing - left;Trunk flexed;Wide base of support    Ambulation Surface Level;Indoor      Knee/Hip Exercises: Aerobic   Other Aerobic Scifit LE's only level 3.0 x 8 minutes with goal >/= 60 steps per minute for strengthening and activitiy tolerance      Knee/Hip Exercises: Standing   Forward Step Up Both;2 sets;5 reps;Hand Hold: 2;Step Height: 4";Limitations    Forward Step Up Limitations with bil UE support on bars, cues on form and technique      Knee/Hip Exercises: Seated   Long Arc Quad AROM;Strengthening;Both;1 set;10 reps;Limitations    Long Arc Quad Weight 2 lbs.    Long [employer] Limitations cues for slow and controlled movements    Heel Slides AROM;Strengthening;Both;10 reps;Limitations    Heel Slides Limitations red band resistance with foot on pillowcase to decrease friction, cues for slow and controlled movements.    Other Seated Knee/Hip Exercises heel<>toe raises with 2# ankle weights for 10 reps.    Marching AROM;Strengthening;Both;1 set;10 reps;Weights;Limitations    Marching Limitations cues on posture    Marching Weights 2 lbs.                 Balance Exercises - 02/07/21 0746       Balance Exercises: Standing   Standing Eyes Closed Wide  (BOA);Foam/compliant surface;Head turns;Other reps (comment);30 secs;Limitations    Standing Eyes Closed Limitations on airex with light UE support, feet hip width apart: EC 30 sec's x 3 reps, progressing to EC head movements left<>right, up<>down for ~10 reps. min guard to min assist for balance with cues on posture and weight shifting to assist with balance.                  PT Short Term Goals - 01/22/21 0955       PT SHORT TERM GOAL #1   Title Pt will  be IND w/ initial HEP in order to indicate improved functional mobility and dec fall risk.  (Target Date: 02/13/21)    Time 4    Period Weeks    Status On-going    Target Date 02/13/21      PT SHORT TERM GOAL #2   Title Pt will complete BERG balance and improve score by 6 points from baseline in order to indicate dec fall risk.    Baseline 01/22/21: 42/56 scored as baseline    Time 4    Period Weeks    Status On-going      PT SHORT TERM GOAL #3   Title Pt will improve gait speed to >/=1.92 ft/sec with device (if needed) in order to indicate dec fall risk.    Time 4    Period Weeks    Status On-going      PT SHORT TERM GOAL #4   Title Willl assess appropriateness for use of AD with gait to determine if safe for use at home/in community.    Time 4    Period Weeks    Status On-going      PT SHORT TERM GOAL #5   Title Will perform TUG and write appropriate LTG to reflect dec fall risk.    Baseline 01/22/21: 11.47 sec's no AD Normal TUG, PT to set goal    Time 4  Period Weeks    Status Partially Met               PT Long Term Goals - 01/22/21 0957       PT LONG TERM GOAL #1   Title Pt will be IND with final HEP in order to indicate improved functional mobility and dec fall risk. (Target Date: 03/15/21)    Time 8    Period Weeks    Status New    Target Date 03/15/21      PT LONG TERM GOAL #2   Title Pt will improve BERG balance score by 10 points from baseline in order to indicate dec fall risk.     Baseline 01/22/21: 42/56 scored as baseline    Time 8    Period Weeks    Status New      PT LONG TERM GOAL #3   Title Pt will ambulate with gait speed >/=2.5 ft/sec with device (if needed) in order to indicate dec fall risk.    Time 8    Period Weeks    Status New      PT LONG TERM GOAL #4   Title Pt will ambulate 300' w/ LRAD over unlevel paved surfaces (with device if needed) at S level in order to indicate improved community safety.    Time 8    Period Weeks    Status New                   Plan - 02/07/21 8592     Clinical Impression Statement Today's skilled session continued to focus on strengthening and balance training with rest breaks taken as needed. No issues noted or reported in session. The pt is making steady progress toward goals and should benefit from continued PT to progress toward unmet goals.    Personal Factors and Comorbidities Comorbidity 3+;Time since onset of injury/illness/exacerbation    Comorbidities see above    Examination-Activity Limitations Dressing;Locomotion Level;Squat;Stairs;Stand;Lift;Transfers    Examination-Participation Restrictions Cleaning;Community Activity;Driving;Laundry;Shop;Meal Prep    Stability/Clinical Decision Making Evolving/Moderate complexity    Rehab Potential Good    PT Frequency 2x / week    PT Duration 8 weeks    PT Treatment/Interventions ADLs/Self Care Home Management;Aquatic Therapy;DME Instruction;Gait training;Stair training;Functional mobility training;Therapeutic activities;Therapeutic exercise;Balance training;Neuromuscular re-education;Patient/family education;Orthotic Fit/Training;Energy conservation;Vestibular    PT Next Visit Plan continue to work on strengthening and balance training    PT Home Exercise Plan Access Code: [email or web address]    Consulted and Agree with Plan of Care Patient             Patient will benefit from skilled therapeutic intervention in order to improve the following deficits and  impairments:  Abnormal gait, Cardiopulmonary status limiting activity, Decreased activity tolerance, Decreased balance, Decreased coordination, Decreased endurance, Decreased knowledge of use of DME, Decreased mobility, Decreased strength, Difficulty walking, Impaired perceived functional ability, Impaired sensation, Postural dysfunction  Visit Diagnosis: Unsteadiness on feet  Other abnormalities of gait and mobility  Muscle weakness (generalized)  Other disturbances of skin sensation  Repeated falls     Problem List Patient Active Problem List   Diagnosis Date Noted   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[place]) 10/30/2020   Encounter for immunotherapy 10/30/2020   History of optic neuritis 10/30/2020   Urinary retention 07/17/2020   Encounter for monitoring immunomodulating therapy 05/02/2020   Numbness 08/29/2019   Gait disturbance 08/29/2019   Abnormal brain MRI 08/29/2019   High risk medication use 08/29/2019   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  Adrenal insufficiency (Addison's disease) ([place]) 08/11/2019   S/P left TKA 08/25/2016   S/P total knee replacement 08/25/2016   S/P shoulder replacement 06/29/2014   OSA (obstructive sleep apnea) 01/22/2014   Left ureteral stone 12/18/2013   UTI (urinary tract infection) 12/18/2013   Morbid obesity ([place]) 08/09/2013   S/P right TKA 08/08/2013   Chronic pain syndrome 03/04/2013   Depressive disorder, not elsewhere classified 03/04/2013   Need for prophylactic vaccination and inoculation against influenza 10/04/2012   Erosive osteoarthritis of multiple sites 10/04/2012   Chronic cough 10/04/2012   Swelling of limb 08/21/2012   Essential hypertension, benign 08/21/2012   Preoperative general physical examination 06/12/2012   Restless leg syndrome 06/12/2012   Anxiety state, unspecified 06/12/2012   Lung nodule 07/16/2011   Cough 04/21/2011   Wolff-Parkinson-White (WPW) syndrome    Osteoarthritis    Hyperlipidemia    IBS (irritable bowel  syndrome)    Kidney stones    Willow Ora, PTA, [facility] [address] [place], [place] [address] [phone] 02/07/21, 10:55 AM   Name: Connie Haynes MRN: [id number] Date of Birth: August 27, 1955

## 2021-02-11 ENCOUNTER — Ambulatory Visit: Payer: Medicare Other | Admitting: Rehabilitation

## 2021-02-13 ENCOUNTER — Ambulatory Visit: Payer: Medicare Other | Admitting: Rehabilitation

## 2021-02-13 ENCOUNTER — Other Ambulatory Visit: Payer: Self-pay | Admitting: Neurology

## 2021-02-13 ENCOUNTER — Encounter: Payer: Self-pay | Admitting: Neurology

## 2021-02-18 ENCOUNTER — Encounter: Payer: Self-pay | Admitting: Physical Therapy

## 2021-02-18 ENCOUNTER — Other Ambulatory Visit: Payer: Self-pay

## 2021-02-18 ENCOUNTER — Ambulatory Visit: Payer: Medicare Other | Attending: Family Medicine | Admitting: Physical Therapy

## 2021-02-18 DIAGNOSIS — R208 Other disturbances of skin sensation: Secondary | ICD-10-CM | POA: Insufficient documentation

## 2021-02-18 DIAGNOSIS — R2689 Other abnormalities of gait and mobility: Secondary | ICD-10-CM | POA: Insufficient documentation

## 2021-02-18 DIAGNOSIS — R2681 Unsteadiness on feet: Secondary | ICD-10-CM | POA: Diagnosis present

## 2021-02-18 DIAGNOSIS — R296 Repeated falls: Secondary | ICD-10-CM | POA: Insufficient documentation

## 2021-02-18 DIAGNOSIS — M6281 Muscle weakness (generalized): Secondary | ICD-10-CM | POA: Diagnosis present

## 2021-02-18 NOTE — Therapy (Signed)
[facility] [address] [place], [place], [address] Phone: [phone]   Fax:  [phone]  Physical Therapy Treatment  Patient Details  Name: Connie Haynes MRN: [id number] Date of Birth: 1955/10/21 Referring Provider (PT): Arlice Colt, MD   Encounter Date: 02/18/2021   PT End of Session - 02/18/21 0853     Visit Number 6    Number of Visits 17    Date for PT Re-Evaluation 03/15/21    Authorization Type [employer] Medicare (10th visit PN needed)    Progress Note Due on Visit 10    PT Start Time 0847    PT Stop Time 0930    PT Time Calculation (min) 43 min    Equipment Utilized During Treatment Gait belt    Activity Tolerance Patient tolerated treatment well    Behavior During Therapy WFL for tasks assessed/performed             Past Medical History:  Diagnosis Date   Adrenal insufficiency ([place])    Anemia    denies   Anxiety    Basal cell carcinoma    eye lid   Chronic kidney disease, stage 3 (HCC)    Cough    hx. dry cough chronic   Disorder of tear duct system    frequent tearing of eyes   GERD (gastroesophageal reflux disease)    H/O: whooping cough 2000   History of kidney infection 2015   ? sepsis   History of kidney stones    History of kidney stones    Hyperlipidemia    Hypertension    IBS (irritable bowel syndrome)    controlled with med   Osteoarthritis    arthritis -joints-limited ROM shoulders more on right   PONV (postoperative nausea and vomiting)     per pt hx of low BP following shoulder surgery in 05-2015; was managed in hospital with IV fluids and d/c BP being taken a tthe time; pt was dced and F/U with endcrinoopgist who dx with adrenal insurffiency and started steroid therapy; treatmetnt with positive outome, now managed chronically on hydrocortisone tablets;   Sleep apnea    no longer using cpap   Stroke [facility])    per patient "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 Hulen Skains)   [facility]     '93 for WPW syndrome   CARPAL TUNNEL RELEASE Right 12/21/2014   Procedure: RIGHT CARPAL TUNNEL RELEASE ;  Surgeon: Netta Cedars, MD;  Location: [facility];  Service: Orthopedics;  Laterality: Right;   CARPAL TUNNEL RELEASE Left    CERVICAL SPINE SURGERY     cervical fusion with hardware retained   COLON SURGERY     "colon detached from abdomen"   COLONOSCOPY WITH PROPOFOL N/A 05/10/2014   Procedure: COLONOSCOPY WITH PROPOFOL;  Surgeon: Juanita Craver, MD;  Location: [facility];  Service: Endoscopy;  Laterality: N/A;   CYSTOSCOPY/RETROGRADE/URETEROSCOPY Left 12/18/2013   Procedure: CYSTOSCOPY/RETROGRADE/LEFT STENT;  Surgeon: Malka So, MD;  Location: [facility];  Service: Urology;  Laterality: Left;   ESOPHAGOGASTRODUODENOSCOPY (EGD) WITH PROPOFOL N/A 05/10/2014   Procedure: ESOPHAGOGASTRODUODENOSCOPY (EGD) WITH PROPOFOL;  Surgeon: Juanita Craver, MD;  [facility]: [facility];  Service: Endoscopy;  Laterality: N/A;   EYELID CARCINOMA EXCISION     INSERTION OF DIALYSIS CATHETER N/A 08/16/2019   Procedure: INSERTION OF DIALYSIS CATHETER;  Surgeon: Garner Nash, DO;  Location: [facility];  Service: Pulmonary;  Laterality: N/A;   JOINT REPLACEMENT     KNEE ARTHROSCOPY Left 07/16/2016   REVERSE SHOULDER ARTHROPLASTY Right 06/29/2014   Procedure: REVERSE TOTAL SHOULDER ARTHROPLASTY;  Surgeon: Netta Cedars, MD;  Location: [facility];  Service: Orthopedics;  Laterality: Right;   REVISION TOTAL SHOULDER TO REVERSE TOTAL SHOULDER Left 05/15/2015    REVISION TOTAL SHOULDER TO REVERSE TOTAL SHOULDER Left 05/15/2015   Procedure: LEFT SHOULDER REVISION TOTAL SHOULDER ARTHRPLASTY TO REVERSE TOTAL SHOULDER ARTHROPLASTY ;  Surgeon: Netta Cedars, MD;  Location: [facility];  Service: Orthopedics;  Laterality: Left;   tear duct surgery     TOTAL ABDOMINAL HYSTERECTOMY     TOTAL HIP ARTHROPLASTY     x 3(rt x2, lt x1)   TOTAL KNEE ARTHROPLASTY Right 08/08/2013   Procedure: RIGHT TOTAL KNEE ARTHROPLASTY WITH SAPHENOUS NERVE SCAR EXCISION;  Surgeon: Mauri Pole, MD;  [facility];  Service: Orthopedics;  Laterality: Right;   TOTAL KNEE ARTHROPLASTY Left 08/25/2016   Procedure: LEFT TOTAL KNEE ARTHROPLASTY;  Surgeon: Paralee Cancel, MD;  [facility];  Service: Orthopedics;  Laterality: Left;  70 mins; Adductor block   TOTAL SHOULDER ARTHROPLASTY Left 07/15/2012   Procedure: LEFT TOTAL SHOULDER ARTHROPLASTY;  Surgeon: Augustin Schooling, MD;  Location: [facility];  Service: Orthopedics;  Laterality: Left;   TOTAL SHOULDER REVISION Right 12/21/2014   Procedure: RIGHT SHOULDER POLY EXCHANGE ;  Surgeon: Netta Cedars, MD;  Location: [facility];  Service: Orthopedics;  Laterality: Right;   URETEROSCOPY  stent placement    There were no vitals filed for this visit.   Subjective Assessment - 02/18/21 0850     Subjective Last week her fatigue was "so bad" she could not move, why she missed her appointments. Yesterday had an episode of where her left leg gets "really heavy" and can't lift it. Has these episodes occur often when she over does it.    Pertinent History Adrenal insufficiency, HTN, wearing 2.5L O2 only at night.    Limitations Walking;Standing;House hold activities    Patient Stated Goals To work on my balance and walking    Currently in Pain? Yes    Pain Score 2     Pain Location Other (Comment)   upper trap area   Pain Orientation Left    Pain Descriptors / Indicators Sore;Tightness    Pain Type Acute pain    Pain Onset Yesterday    Pain Frequency  Constant    Aggravating Factors  muscle tensing, positioning    Pain Relieving Factors rest, heat                [facility] PT Assessment - 02/18/21 0853       Berg Balance Test   Sit to Stand Able to stand without using hands and stabilize independently    Standing Unsupported Able to stand safely 2 minutes    Sitting with Back Unsupported but Feet Supported on Floor or Stool Able to sit safely and securely 2 minutes    Stand to Sit Sits safely with minimal use of hands  Transfers Able to transfer safely, minor use of hands    Standing Unsupported with Eyes Closed Able to stand 10 seconds with supervision    Standing Unsupported with Feet Together Able to place feet together independently and stand for 1 minute with supervision    From Standing, Reach Forward with Outstretched Arm Can reach forward >12 cm safely (5")   6-7 inches   From Standing Position, Pick up Object from [place] to pick up shoe safely and easily    From Standing Position, Turn to Look Behind Over each Shoulder Looks behind from both sides and weight shifts well    Turn 360 Degrees Able to turn 360 degrees safely one side only in 4 seconds or less   right direction only   Standing Unsupported, Alternately Place Feet on Step/Stool Able to stand independently and complete 8 steps >20 seconds    Standing Unsupported, One Foot in Front Able to take small step independently and hold 30 seconds    Standing on One Leg Tries to lift leg/unable to hold 3 seconds but remains standing independently    Total Score 46    Berg comment: 46-51 Moderate                    [facility] Adult PT Treatment/Exercise - 02/18/21 0853       Transfers   Transfers Sit to Stand;Stand to Sit    Sit to Stand 6: Modified independent (Device/Increase time)    Stand to Sit 6: Modified independent (Device/Increase time)      Ambulation/Gait   Ambulation/Gait Yes    Ambulation/Gait Assistance 5: Supervision;4: Min guard    Assistive  device None    Gait Pattern Step-through pattern;Decreased stride length;Decreased arm swing - right;Decreased arm swing - left;Trunk flexed;Wide base of support    Ambulation Surface Level;Indoor    Gait velocity 12.78's= 2.57 ft/sec no device      Self-Care   Self-Care Other Self-Care Comments    Other Self-Care Comments  discussed HEP. marching and single leg stance remain challenging. side stepping has become easy. Added red band resistance to this exercise.      Knee/Hip Exercises: Aerobic   Other Aerobic Scifit LE's only level 3.0 x 8 minutes with goal >/= 60 steps per minute for strengthening and activitiy tolerance                       PT Short Term Goals - 02/18/21 1128       PT SHORT TERM GOAL #1   Title Pt will  be IND w/ initial HEP in order to indicate improved functional mobility and dec fall risk.  (Target Date: 02/13/21)    Baseline 02/18/21: met with current program    Status Achieved    Target Date 02/13/21      PT SHORT TERM GOAL #2   Title Pt will complete BERG balance and improve score by 6 points from baseline in order to indicate dec fall risk.    Baseline 02/18/21: 46/56, 4 point improvement from baseline score of 42/56    Status Partially Met      PT SHORT TERM GOAL #3   Title Pt will improve gait speed to >/=1.92 ft/sec with device (if needed) in order to indicate dec fall risk.    Baseline 02/18/21: 2.57 ft/sec with no AD    Status Achieved      PT SHORT TERM GOAL #4   Title Willl assess  appropriateness for use of AD with gait to determine if safe for use at home/in community.    Baseline 02/18/21: RW has been trialed with decreased safety noted    Status Achieved      PT SHORT TERM GOAL #5   Title Will perform TUG and write appropriate LTG to reflect dec fall risk.    Baseline 01/22/21: 11.47 sec's no AD Normal TUG, PT to set goal    Status Partially Met               PT Long Term Goals - 01/22/21 0957       PT LONG TERM GOAL #1    Title Pt will be IND with final HEP in order to indicate improved functional mobility and dec fall risk. (Target Date: 03/15/21)    Time 8    Period Weeks    Status New    Target Date 03/15/21      PT LONG TERM GOAL #2   Title Pt will improve BERG balance score by 10 points from baseline in order to indicate dec fall risk.    Baseline 01/22/21: 42/56 scored as baseline    Time 8    Period Weeks    Status New      PT LONG TERM GOAL #3   Title Pt will ambulate with gait speed >/=2.5 ft/sec with device (if needed) in order to indicate dec fall risk.    Time 8    Period Weeks    Status New      PT LONG TERM GOAL #4   Title Pt will ambulate 300' w/ LRAD over unlevel paved surfaces (with device if needed) at S level in order to indicate improved community safety.    Time 8    Period Weeks    Status New                   Plan - 02/18/21 0853     Clinical Impression Statement Today's skilled session focused on progress toward STGs. Pt has met her HEP goal and 10 meter gait speed goal with score of 2.57 ft/sec with no AD. Pt partially met her Merrilee Jansky Balance Test goal with score of 46/56, 4 point improvement from baseline score. The pt is making progress and should benefit from continued PT to progress toward unmet goals.    Personal Factors and Comorbidities Comorbidity 3+;Time since onset of injury/illness/exacerbation    Comorbidities see above    Examination-Activity Limitations Dressing;Locomotion Level;Squat;Stairs;Stand;Lift;Transfers    Examination-Participation Restrictions Cleaning;Community Activity;Driving;Laundry;Shop;Meal Prep    Stability/Clinical Decision Making Evolving/Moderate complexity    Rehab Potential Good    PT Frequency 2x / week    PT Duration 8 weeks    PT Treatment/Interventions ADLs/Self Care Home Management;Aquatic Therapy;DME Instruction;Gait training;Stair training;Functional mobility training;Therapeutic activities;Therapeutic exercise;Balance  training;Neuromuscular re-education;Patient/family education;Orthotic Fit/Training;Energy conservation;Vestibular    PT Next Visit Plan continue to work on strengthening and balance training    PT Home Exercise Plan Access Code: [email or web address]    Consulted and Agree with Plan of Care Patient             Patient will benefit from skilled therapeutic intervention in order to improve the following deficits and impairments:  Abnormal gait, Cardiopulmonary status limiting activity, Decreased activity tolerance, Decreased balance, Decreased coordination, Decreased endurance, Decreased knowledge of use of DME, Decreased mobility, Decreased strength, Difficulty walking, Impaired perceived functional ability, Impaired sensation, Postural dysfunction  Visit Diagnosis: Unsteadiness on feet  Other abnormalities of gait  and mobility  Muscle weakness (generalized)  Other disturbances of skin sensation  Repeated falls     Problem List Patient Active Problem List   Diagnosis Date Noted   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[place]) 10/30/2020   Encounter for immunotherapy 10/30/2020   History of optic neuritis 10/30/2020   Urinary retention 07/17/2020   Encounter for monitoring immunomodulating therapy 05/02/2020   Numbness 08/29/2019   Gait disturbance 08/29/2019   Abnormal brain MRI 08/29/2019   High risk medication use 08/29/2019   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   Adrenal insufficiency (Addison's disease) ([place]) 08/11/2019   S/P left TKA 08/25/2016   S/P total knee replacement 08/25/2016   S/P shoulder replacement 06/29/2014   OSA (obstructive sleep apnea) 01/22/2014   Left ureteral stone 12/18/2013   UTI (urinary tract infection) 12/18/2013   Morbid obesity ([place]) 08/09/2013   S/P right TKA 08/08/2013   Chronic pain syndrome 03/04/2013   Depressive disorder, not elsewhere classified 03/04/2013   Need for prophylactic vaccination and inoculation against influenza 10/04/2012   Erosive osteoarthritis of  multiple sites 10/04/2012   Chronic cough 10/04/2012   Swelling of limb 08/21/2012   Essential hypertension, benign 08/21/2012   Preoperative general physical examination 06/12/2012   Restless leg syndrome 06/12/2012   Anxiety state, unspecified 06/12/2012   Lung nodule 07/16/2011   Cough 04/21/2011   Wolff-Parkinson-White (WPW) syndrome    Osteoarthritis    Hyperlipidemia    IBS (irritable bowel syndrome)    Kidney stones     Willow Ora, PTA, [facility] [address], [place], [place] [address] [phone] 02/18/21, 11:32 AM   Name: Connie Haynes MRN: [id number] Date of Birth: September 30, 1955

## 2021-02-20 ENCOUNTER — Ambulatory Visit: Payer: Medicare Other | Admitting: Physical Therapy

## 2021-02-20 ENCOUNTER — Other Ambulatory Visit: Payer: Self-pay

## 2021-02-20 ENCOUNTER — Encounter: Payer: Self-pay | Admitting: Physical Therapy

## 2021-02-20 DIAGNOSIS — R2681 Unsteadiness on feet: Secondary | ICD-10-CM | POA: Diagnosis not present

## 2021-02-20 DIAGNOSIS — R2689 Other abnormalities of gait and mobility: Secondary | ICD-10-CM

## 2021-02-20 DIAGNOSIS — R208 Other disturbances of skin sensation: Secondary | ICD-10-CM

## 2021-02-20 DIAGNOSIS — M6281 Muscle weakness (generalized): Secondary | ICD-10-CM

## 2021-02-20 NOTE — Therapy (Signed)
[facility] [address] [place], [place], [address] Phone: [phone]   Fax:  [phone]  Physical Therapy Treatment  Patient Details  Name: Connie Haynes MRN: [id number] Date of Birth: [birth year] Referring Provider (PT): Arlice Colt, MD   Encounter Date: 02/20/2021   PT End of Session - 02/20/21 0850     Visit Number 7    Number of Visits 17    Date for PT Re-Evaluation 03/15/21    Authorization Type [employer] Medicare (10th visit PN needed)    Progress Note Due on Visit 10    PT Start Time 0847    PT Stop Time 0928    PT Time Calculation (min) 41 min    Equipment Utilized During Treatment Gait belt    Activity Tolerance Patient tolerated treatment well    Behavior During Therapy WFL for tasks assessed/performed             Past Medical History:  Diagnosis Date   Adrenal insufficiency ([place])    Anemia    denies   Anxiety    Basal cell carcinoma    eye lid   Chronic kidney disease, stage 3 (HCC)    Cough    hx. dry cough chronic   Disorder of tear duct system    frequent tearing of eyes   GERD (gastroesophageal reflux disease)    H/O: whooping cough 2000   History of kidney infection 2015   ? sepsis   History of kidney stones    History of kidney stones    Hyperlipidemia    Hypertension    IBS (irritable bowel syndrome)    controlled with med   Osteoarthritis    arthritis -joints-limited ROM shoulders more on right   PONV (postoperative nausea and vomiting)     per pt hx of low BP following shoulder surgery in 05-2015; was managed in hospital with IV fluids and d/c BP being taken a tthe time; pt was dced and F/U with endcrinoopgist who dx with adrenal insurffiency and started steroid therapy; treatmetnt with positive outome, now managed chronically on hydrocortisone tablets;   Sleep apnea    no longer using cpap   Stroke [facility])    per patient "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 Hulen Skains)   [place]     '93 for WPW syndrome   CARPAL TUNNEL RELEASE Right 12/21/2014   Procedure: RIGHT CARPAL TUNNEL RELEASE ;  Surgeon: Netta Cedars, MD;  Location: [facility];  Service: Orthopedics;  Laterality: Right;   CARPAL TUNNEL RELEASE Left    CERVICAL SPINE SURGERY     cervical fusion with hardware retained   COLON SURGERY     "colon detached from abdomen"   COLONOSCOPY WITH PROPOFOL N/A 05/10/2014   Procedure: COLONOSCOPY WITH PROPOFOL;  Surgeon: Juanita Craver, MD;  Location: [facility];  Service: Endoscopy;  Laterality: N/A;   CYSTOSCOPY/RETROGRADE/URETEROSCOPY Left 12/18/2013   Procedure: CYSTOSCOPY/RETROGRADE/LEFT STENT;  Surgeon: Malka So, MD;  Location: [facility];  Service: Urology;  Laterality: Left;   ESOPHAGOGASTRODUODENOSCOPY (EGD) WITH PROPOFOL N/A 05/10/2014   Procedure: ESOPHAGOGASTRODUODENOSCOPY (EGD) WITH PROPOFOL;  Surgeon: Juanita Craver, MD;  Location: [facility];  Service: Endoscopy;  Laterality: N/A;   EYELID CARCINOMA EXCISION     INSERTION OF DIALYSIS CATHETER N/A 08/16/2019   Procedure: INSERTION OF DIALYSIS CATHETER;  Surgeon: Garner Nash, DO;  Location: [facility];  Service: Pulmonary;  Laterality: N/A;   JOINT REPLACEMENT     KNEE ARTHROSCOPY Left 07/16/2016   REVERSE SHOULDER ARTHROPLASTY Right 06/29/2014   Procedure: REVERSE TOTAL SHOULDER ARTHROPLASTY;  Surgeon: Netta Cedars, MD;  Location: [facility];  Service: Orthopedics;  Laterality: Right;   REVISION TOTAL SHOULDER TO REVERSE TOTAL SHOULDER Left 05/15/2015    REVISION TOTAL SHOULDER TO REVERSE TOTAL SHOULDER Left 05/15/2015   Procedure: LEFT SHOULDER REVISION TOTAL SHOULDER ARTHRPLASTY TO REVERSE TOTAL SHOULDER ARTHROPLASTY ;  Surgeon: Netta Cedars, MD;  Location: [facility];  Service: Orthopedics;  Laterality: Left;   tear duct surgery     TOTAL ABDOMINAL HYSTERECTOMY     TOTAL HIP ARTHROPLASTY     x 3(rt x2, lt x1)   TOTAL KNEE ARTHROPLASTY Right 08/08/2013   Procedure: RIGHT TOTAL KNEE ARTHROPLASTY WITH SAPHENOUS NERVE SCAR EXCISION;  Surgeon: Mauri Pole, MD;  Location[facility];  Service: Orthopedics;  Laterality: Right;   TOTAL KNEE ARTHROPLASTY Left 08/25/2016   Procedure: LEFT TOTAL KNEE ARTHROPLASTY;  Surgeon: Paralee Cancel, MD;  Location: [facility];  Service: Orthopedics;  Laterality: Left;  70 mins; Adductor block   TOTAL SHOULDER ARTHROPLASTY Left 07/15/2012   Procedure: LEFT TOTAL SHOULDER ARTHROPLASTY;  Surgeon: Augustin Schooling, MD;  Location: [facility];  Service: Orthopedics;  Laterality: Left;   TOTAL SHOULDER REVISION Right 12/21/2014   Procedure: RIGHT SHOULDER POLY EXCHANGE ;  Surgeon: Netta Cedars, MD;  Location: [facility];  Service: Orthopedics;  Laterality: Right;   URETEROSCOPY  stent placement    There were no vitals filed for this visit.   Subjective Assessment - 02/20/21 0848     Subjective No new complaints. No falls or pain to report.    Pertinent History Adrenal insufficiency, HTN, wearing 2.5L O2 only at night.    Patient Stated Goals To work on my balance and walking    Currently in Pain? Yes    Pain Score 2    soreness/tightness more than pain   Pain Location Other (Comment)   left upper trap   Pain Orientation Left    Pain Descriptors / Indicators Sore;Tightness    Pain Type Acute pain    Pain Onset In the past 7 days    Pain Frequency Constant    Aggravating Factors  muscle tensing, positioning    Pain Relieving Factors rest, heat                     [facility] Adult PT Treatment/Exercise - 02/20/21 0850        Transfers   Transfers Sit to Stand;Stand to Sit    Sit to Stand 6: Modified independent (Device/Increase time)    Stand to Sit 6: Modified independent (Device/Increase time)      Ambulation/Gait   Ambulation/Gait Yes    Ambulation/Gait Assistance 5: Supervision;4: Min guard    Ambulation Distance (Feet) --   around clinic with session   Assistive device None    Gait Pattern Step-through pattern;Decreased stride length;Decreased arm swing - right;Decreased arm swing - left;Trunk flexed;Wide base of support  Ambulation Surface Level;Indoor      High Level Balance   High Level Balance Activities Side stepping;Marching forwards;Backward walking    High Level Balance Comments blue mat in parallel bars with light UE support for 4 laps each, min guard assist for safety with cues on form/technique      Exercises   Exercises Other Exercises    Other Exercises  seated at edge of mat with feet in staggered position for sit<>stands x 5 reps each foot forward, min guard assist, no UE support. cues for tall standing and controlled descent.      Knee/Hip Exercises: Aerobic   Other Aerobic Scifit LE's only level 3.5 x 8 minutes with goal >/= 60 steps per minute for strengthening and activitiy tolerance                 Balance Exercises - 02/20/21 0908       Balance Exercises: Standing   Rockerboard Anterior/posterior;Lateral;Head turns;EO;EC;30 seconds;Other reps (comment);Intermittent UE support;Limitations    Rockerboard Limitations performed both ways on balance board with light to no UE support: with light touch on bars for rocking the board with EO progressing to no UE support on bars. then back with light UE support for rocking the board with EC. min guard assist for safety/balance with cues on posture/technique. then holding the board steady with light to no support for EC 30 sec's x 3 reps with min guard to min assist. then with light touch to bars for EO head movements left<>right,  up<>down for ~8-10 reps each. min guard to min assist for balance. cues for posture and weight shifitng to assist with balance when holding board steady.                  PT Short Term Goals - 02/18/21 1128       PT SHORT TERM GOAL #1   Title Pt will  be IND w/ initial HEP in order to indicate improved functional mobility and dec fall risk.  (Target Date: 02/13/21)    Baseline 02/18/21: met with current program    Status Achieved    Target Date 02/13/21      PT SHORT TERM GOAL #2   Title Pt will complete BERG balance and improve score by 6 points from baseline in order to indicate dec fall risk.    Baseline 02/18/21: 46/56, 4 point improvement from baseline score of 42/56    Status Partially Met      PT SHORT TERM GOAL #3   Title Pt will improve gait speed to >/=1.92 ft/sec with device (if needed) in order to indicate dec fall risk.    Baseline 02/18/21: 2.57 ft/sec with no AD    Status Achieved      PT SHORT TERM GOAL #4   Title Willl assess appropriateness for use of AD with gait to determine if safe for use at home/in community.    Baseline 02/18/21: RW has been trialed with decreased safety noted    Status Achieved      PT SHORT TERM GOAL #5   Title Will perform TUG and write appropriate LTG to reflect dec fall risk.    Baseline 01/22/21: 11.47 sec's no AD Normal TUG, PT to set goal    Status Partially Met               PT Long Term Goals - 01/22/21 0957       PT LONG TERM GOAL #1   Title Pt will  be IND with final HEP in order to indicate improved functional mobility and dec fall risk. (Target Date: 03/15/21)    Time 8    Period Weeks    Status New    Target Date 03/15/21      PT LONG TERM GOAL #2   Title Pt will improve BERG balance score by 10 points from baseline in order to indicate dec fall risk.    Baseline 01/22/21: 42/56 scored as baseline    Time 8    Period Weeks    Status New      PT LONG TERM GOAL #3   Title Pt will ambulate with gait speed  >/=2.5 ft/sec with device (if needed) in order to indicate dec fall risk.    Time 8    Period Weeks    Status New      PT LONG TERM GOAL #4   Title Pt will ambulate 300' w/ LRAD over unlevel paved surfaces (with device if needed) at S level in order to indicate improved community safety.    Time 8    Period Weeks    Status New                   Plan - 02/20/21 0850     Clinical Impression Statement Today's skilled session continued to focus on strengthening and balance training on compliant surfaces. Pt continues to need UE support most times with balance exercises, however does not need to look at feet at all times at this point with gait and some balance exercises. The pt is making steady progress toward goals and should benefit from continued PT to progress toward unmet goals.    Personal Factors and Comorbidities Comorbidity 3+;Time since onset of injury/illness/exacerbation    Comorbidities see above    Examination-Activity Limitations Dressing;Locomotion Level;Squat;Stairs;Stand;Lift;Transfers    Examination-Participation Restrictions Cleaning;Community Activity;Driving;Laundry;Shop;Meal Prep    Stability/Clinical Decision Making Evolving/Moderate complexity    Rehab Potential Good    PT Frequency 2x / week    PT Duration 8 weeks    PT Treatment/Interventions ADLs/Self Care Home Management;Aquatic Therapy;DME Instruction;Gait training;Stair training;Functional mobility training;Therapeutic activities;Therapeutic exercise;Balance training;Neuromuscular re-education;Patient/family education;Orthotic Fit/Training;Energy conservation;Vestibular    PT Next Visit Plan continue to work on strengthening and balance training    PT Home Exercise Plan Access Code: [email or web address]    Consulted and Agree with Plan of Care Patient             Patient will benefit from skilled therapeutic intervention in order to improve the following deficits and impairments:  Abnormal gait,  Cardiopulmonary status limiting activity, Decreased activity tolerance, Decreased balance, Decreased coordination, Decreased endurance, Decreased knowledge of use of DME, Decreased mobility, Decreased strength, Difficulty walking, Impaired perceived functional ability, Impaired sensation, Postural dysfunction  Visit Diagnosis: Unsteadiness on feet  Other abnormalities of gait and mobility  Muscle weakness (generalized)  Other disturbances of skin sensation     Problem List Patient Active Problem List   Diagnosis Date Noted   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[place]) 10/30/2020   Encounter for immunotherapy 10/30/2020   History of optic neuritis 10/30/2020   Urinary retention 07/17/2020   Encounter for monitoring immunomodulating therapy 05/02/2020   Numbness 08/29/2019   Gait disturbance 08/29/2019   Abnormal brain MRI 08/29/2019   High risk medication use 08/29/2019   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   Adrenal insufficiency (Addison's disease) ([place]) 08/11/2019   S/P left TKA 08/25/2016   S/P total knee replacement 08/25/2016   S/P shoulder replacement 06/29/2014  OSA (obstructive sleep apnea) 01/22/2014   Left ureteral stone 12/18/2013   UTI (urinary tract infection) 12/18/2013   Morbid obesity ([place]) 08/09/2013   S/P right TKA 08/08/2013   Chronic pain syndrome 03/04/2013   Depressive disorder, not elsewhere classified 03/04/2013   Need for prophylactic vaccination and inoculation against influenza 10/04/2012   Erosive osteoarthritis of multiple sites 10/04/2012   Chronic cough 10/04/2012   Swelling of limb 08/21/2012   Essential hypertension, benign 08/21/2012   Preoperative general physical examination 06/12/2012   Restless leg syndrome 06/12/2012   Anxiety state, unspecified 06/12/2012   Lung nodule 07/16/2011   Cough 04/21/2011   Wolff-Parkinson-White (WPW) syndrome    Osteoarthritis    Hyperlipidemia    IBS (irritable bowel syndrome)    Kidney stones     Willow Ora,  PTA, [facility] [address], [place], [place] [address] [phone] 02/20/21, 7:12 PM   Name: Connie Haynes MRN: [id number] Date of Birth: 10-May-1955

## 2021-02-25 ENCOUNTER — Ambulatory Visit: Payer: Medicare Other | Admitting: Physical Therapy

## 2021-02-25 ENCOUNTER — Other Ambulatory Visit: Payer: Self-pay

## 2021-02-25 ENCOUNTER — Encounter: Payer: Self-pay | Admitting: Physical Therapy

## 2021-02-25 DIAGNOSIS — R2681 Unsteadiness on feet: Secondary | ICD-10-CM | POA: Diagnosis not present

## 2021-02-25 DIAGNOSIS — R208 Other disturbances of skin sensation: Secondary | ICD-10-CM

## 2021-02-25 DIAGNOSIS — M6281 Muscle weakness (generalized): Secondary | ICD-10-CM

## 2021-02-25 DIAGNOSIS — R2689 Other abnormalities of gait and mobility: Secondary | ICD-10-CM

## 2021-02-25 NOTE — Therapy (Signed)
[facility] [address] [place], [place], [address] Phone: [phone]   Fax:  [phone]  Physical Therapy Treatment  Patient Details  Name: Connie Haynes MRN: [id number] Date of Birth: Mar 27, 1955 Referring Provider (PT): Arlice Colt, MD   Encounter Date: 02/25/2021   PT End of Session - 02/25/21 0850     Visit Number 8    Number of Visits 17    Date for PT Re-Evaluation 03/15/21    Authorization Type [employer] Medicare (10th visit PN needed)    Progress Note Due on Visit 10    PT Start Time 0848    PT Stop Time 0930    PT Time Calculation (min) 42 min    Equipment Utilized During Treatment Gait belt    Activity Tolerance Patient tolerated treatment well    Behavior During Therapy WFL for tasks assessed/performed             Past Medical History:  Diagnosis Date   Adrenal insufficiency ([place])    Anemia    denies   Anxiety    Basal cell carcinoma    eye lid   Chronic kidney disease, stage 3 (HCC)    Cough    hx. dry cough chronic   Disorder of tear duct system    frequent tearing of eyes   GERD (gastroesophageal reflux disease)    H/O: whooping cough 2000   History of kidney infection 2015   ? sepsis   History of kidney stones    History of kidney stones    Hyperlipidemia    Hypertension    IBS (irritable bowel syndrome)    controlled with med   Osteoarthritis    arthritis -joints-limited ROM shoulders more on right   PONV (postoperative nausea and vomiting)     per pt hx of low BP following shoulder surgery in 05-2015; was managed in hospital with IV fluids and d/c BP being taken a tthe time; pt was dced and F/U with endcrinoopgist who dx with adrenal insurffiency and started steroid therapy; treatmetnt with positive outome, now managed chronically on hydrocortisone tablets;   Sleep apnea    no longer using cpap   Stroke [facility])    per patient "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 Hulen Skains)   [place]     '93 for WPW syndrome   CARPAL TUNNEL RELEASE Right 12/21/2014   Procedure: RIGHT CARPAL TUNNEL RELEASE ;  Surgeon: Netta Cedars, MD;  Location: [facility];  Service: Orthopedics;  Laterality: Right;   CARPAL TUNNEL RELEASE Left    CERVICAL SPINE SURGERY     cervical fusion with hardware retained   COLON SURGERY     "colon detached from abdomen"   COLONOSCOPY WITH PROPOFOL N/A 05/10/2014   Procedure: COLONOSCOPY WITH PROPOFOL;  Surgeon: Juanita Craver, MD;  Location: [facility];  Service: Endoscopy;  Laterality: N/A;   CYSTOSCOPY/RETROGRADE/URETEROSCOPY Left 12/18/2013   Procedure: CYSTOSCOPY/RETROGRADE/LEFT STENT;  Surgeon: Malka So, MD;  Location[facility];  Service: Urology;  Laterality: Left;   ESOPHAGOGASTRODUODENOSCOPY (EGD) WITH PROPOFOL N/A 05/10/2014   Procedure: ESOPHAGOGASTRODUODENOSCOPY (EGD) WITH PROPOFOL;  Surgeon: Juanita Craver, MD;  [facility];  Service: Endoscopy;  Laterality: N/A;   EYELID CARCINOMA EXCISION     INSERTION OF DIALYSIS CATHETER N/A 08/16/2019   Procedure: INSERTION OF DIALYSIS CATHETER;  Surgeon: Garner Nash, DO;  Location: [facility];  Service: Pulmonary;  Laterality: N/A;   JOINT REPLACEMENT     KNEE ARTHROSCOPY Left 07/16/2016   REVERSE SHOULDER ARTHROPLASTY Right 06/29/2014   Procedure: REVERSE TOTAL SHOULDER ARTHROPLASTY;  Surgeon: Netta Cedars, MD;  Location: [facility];  Service: Orthopedics;  Laterality: Right;   REVISION TOTAL SHOULDER TO REVERSE TOTAL SHOULDER Left 05/15/2015    REVISION TOTAL SHOULDER TO REVERSE TOTAL SHOULDER Left 05/15/2015   Procedure: LEFT SHOULDER REVISION TOTAL SHOULDER ARTHRPLASTY TO REVERSE TOTAL SHOULDER ARTHROPLASTY ;  Surgeon: Netta Cedars, MD;  Location: [facility];  Service: Orthopedics;  Laterality: Left;   tear duct surgery     TOTAL ABDOMINAL HYSTERECTOMY     TOTAL HIP ARTHROPLASTY     x 3(rt x2, lt x1)   TOTAL KNEE ARTHROPLASTY Right 08/08/2013   Procedure: RIGHT TOTAL KNEE ARTHROPLASTY WITH SAPHENOUS NERVE SCAR EXCISION;  Surgeon: Mauri Pole, MD;  [facility];  Service: Orthopedics;  Laterality: Right;   TOTAL KNEE ARTHROPLASTY Left 08/25/2016   Procedure: LEFT TOTAL KNEE ARTHROPLASTY;  Surgeon: Paralee Cancel, MD;  [facility];  Service: Orthopedics;  Laterality: Left;  70 mins; Adductor block   TOTAL SHOULDER ARTHROPLASTY Left 07/15/2012   Procedure: LEFT TOTAL SHOULDER ARTHROPLASTY;  Surgeon: Augustin Schooling, MD;  Location: [facility];  Service: Orthopedics;  Laterality: Left;   TOTAL SHOULDER REVISION Right 12/21/2014   Procedure: RIGHT SHOULDER POLY EXCHANGE ;  Surgeon: Netta Cedars, MD;  Location: [facility];  Service: Orthopedics;  Laterality: Right;   URETEROSCOPY  stent placement    There were no vitals filed for this visit.   Subjective Assessment - 02/25/21 0850     Subjective No new complaints. No falls or pain to report.    Pertinent History Adrenal insufficiency, HTN, wearing 2.5L O2 only at night.    Limitations Walking;Standing;House hold activities    Patient Stated Goals To work on my balance and walking    Currently in Pain? No/denies    Pain Score 0-No pain                     [facility] Adult PT Treatment/Exercise - 02/25/21 0851       Transfers   Transfers Sit to Stand;Stand to Sit    Sit to Stand 6: Modified independent (Device/Increase time)    Stand to Sit 6: Modified independent (Device/Increase time)      Ambulation/Gait   Ambulation/Gait Yes    Ambulation/Gait Assistance 5: Supervision;4:  Min guard    Ambulation Distance (Feet) --   around clinic with session   Assistive device None    Gait Pattern Step-through pattern;Decreased stride length;Decreased arm swing - right;Decreased arm swing - left;Trunk flexed;Wide base of support    Ambulation Surface Level;Indoor      Knee/Hip Exercises: Aerobic   Other Aerobic Scifit LE's only level 3.5 x 8 minutes with goal >/= 60 steps per minute for strengthening and activitiy tolerance.                 Balance Exercises - 02/25/21 [phone]  Balance Exercises: Standing   SLS with Vectors Foam/compliant surface;Upper extremity assist 2;Other reps (comment);Limitations    SLS with Vectors Limitations on airex with 2 tall cones on floor in front, bil support on bars: alternaring foward, cross, double forward, double cross foot taps for 10 reps each. min guard assist for safety.    Balance Beam standing across blue foam beam: alternating forward stepping to floor/back onto beam, then alternaring backward stepping to floor/back onto beam with UE support on bars. pt closing eyes at [employer] support;Upper extremity support;3 reps;Limitations    Sidestepping Limitations on blue foam beam with light support on bars, cues on step length, posture and forward gaze.    Sit to Stand Standard surface;Foam/compliant surface;Limitations    Sit to Stand Limitations seated at edge of mat with feet on airex: sit<>stand with little to no UE support for 5 reps. min guard assist for safety.                  PT Short Term Goals - 02/18/21 1128       PT SHORT TERM GOAL #1   Title Pt will  be IND w/ initial HEP in order to indicate improved functional mobility and dec fall risk.  (Target Date: 02/13/21)    Baseline 02/18/21: met with current program    Status Achieved    Target Date 02/13/21      PT SHORT TERM GOAL #2   Title Pt will complete BERG balance and improve score by 6 points from baseline in order to  indicate dec fall risk.    Baseline 02/18/21: 46/56, 4 point improvement from baseline score of 42/56    Status Partially Met      PT SHORT TERM GOAL #3   Title Pt will improve gait speed to >/=1.92 ft/sec with device (if needed) in order to indicate dec fall risk.    Baseline 02/18/21: 2.57 ft/sec with no AD    Status Achieved      PT SHORT TERM GOAL #4   Title Willl assess appropriateness for use of AD with gait to determine if safe for use at home/in community.    Baseline 02/18/21: RW has been trialed with decreased safety noted    Status Achieved      PT SHORT TERM GOAL #5   Title Will perform TUG and write appropriate LTG to reflect dec fall risk.    Baseline 01/22/21: 11.47 sec's no AD Normal TUG, PT to set goal    Status Partially Met               PT Long Term Goals - 01/22/21 0957       PT LONG TERM GOAL #1   Title Pt will be IND with final HEP in order to indicate improved functional mobility and dec fall risk. (Target Date: 03/15/21)    Time 8    Period Weeks    Status New    Target Date 03/15/21      PT LONG TERM GOAL #2   Title Pt will improve BERG balance score by 10 points from baseline in order to indicate dec fall risk.    Baseline 01/22/21: 42/56 scored as baseline    Time 8    Period Weeks    Status New      PT LONG TERM GOAL #3   Title Pt will ambulate with gait speed >/=2.5 ft/sec with device (if needed) in order to indicate dec fall  risk.    Time 8    Period Weeks    Status New      PT LONG TERM GOAL #4   Title Pt will ambulate 300' w/ LRAD over unlevel paved surfaces (with device if needed) at S level in order to indicate improved community safety.    Time 8    Period Weeks    Status New                   Plan - 02/25/21 0850     Clinical Impression Statement Today's skilled session continued to focus on strengthening and balance training on compliant surfaces. Pt needing short rest breaks to "reset" her focus/mind after being on  complaint surfaces. No other issues noted or reported in session. The pt is making steady progress toward goals and should benefit from continued PT to progress toward unmet goals.    Personal Factors and Comorbidities Comorbidity 3+;Time since onset of injury/illness/exacerbation    Comorbidities see above    Examination-Activity Limitations Dressing;Locomotion Level;Squat;Stairs;Stand;Lift;Transfers    Examination-Participation Restrictions Cleaning;Community Activity;Driving;Laundry;Shop;Meal Prep    Stability/Clinical Decision Making Evolving/Moderate complexity    Rehab Potential Good    PT Frequency 2x / week    PT Duration 8 weeks    PT Treatment/Interventions ADLs/Self Care Home Management;Aquatic Therapy;DME Instruction;Gait training;Stair training;Functional mobility training;Therapeutic activities;Therapeutic exercise;Balance training;Neuromuscular re-education;Patient/family education;Orthotic Fit/Training;Energy conservation;Vestibular    PT Next Visit Plan add/advance balance ex's on HEP; continue to work on strengthening and balance training    PT Home Exercise Plan Access Code: [email or web address]    Consulted and Agree with Plan of Care Patient             Patient will benefit from skilled therapeutic intervention in order to improve the following deficits and impairments:  Abnormal gait, Cardiopulmonary status limiting activity, Decreased activity tolerance, Decreased balance, Decreased coordination, Decreased endurance, Decreased knowledge of use of DME, Decreased mobility, Decreased strength, Difficulty walking, Impaired perceived functional ability, Impaired sensation, Postural dysfunction  Visit Diagnosis: Unsteadiness on feet  Other abnormalities of gait and mobility  Muscle weakness (generalized)  Other disturbances of skin sensation     Problem List Patient Active Problem List   Diagnosis Date Noted   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[place]) 10/30/2020   Encounter for  immunotherapy 10/30/2020   History of optic neuritis 10/30/2020   Urinary retention 07/17/2020   Encounter for monitoring immunomodulating therapy 05/02/2020   Numbness 08/29/2019   Gait disturbance 08/29/2019   Abnormal brain MRI 08/29/2019   High risk medication use 08/29/2019   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   Adrenal insufficiency (Addison's disease) ([place]) 08/11/2019   S/P left TKA 08/25/2016   S/P total knee replacement 08/25/2016   S/P shoulder replacement 06/29/2014   OSA (obstructive sleep apnea) 01/22/2014   Left ureteral stone 12/18/2013   UTI (urinary tract infection) 12/18/2013   Morbid obesity ([place]) 08/09/2013   S/P right TKA 08/08/2013   Chronic pain syndrome 03/04/2013   Depressive disorder, not elsewhere classified 03/04/2013   Need for prophylactic vaccination and inoculation against influenza 10/04/2012   Erosive osteoarthritis of multiple sites 10/04/2012   Chronic cough 10/04/2012   Swelling of limb 08/21/2012   Essential hypertension, benign 08/21/2012   Preoperative general physical examination 06/12/2012   Restless leg syndrome 06/12/2012   Anxiety state, unspecified 06/12/2012   Lung nodule 07/16/2011   Cough 04/21/2011   Wolff-Parkinson-White (WPW) syndrome    Osteoarthritis    Hyperlipidemia    IBS (irritable bowel syndrome)  Kidney stones     Willow Ora, PTA, [facility] [address], [place], [place] [address] [phone] 02/25/21, 8:43 PM   Name: Connie Haynes MRN: [id number] Date of Birth: [birth year]

## 2021-02-27 ENCOUNTER — Other Ambulatory Visit: Payer: Self-pay

## 2021-02-27 ENCOUNTER — Ambulatory Visit: Payer: Medicare Other | Admitting: Physical Therapy

## 2021-02-27 ENCOUNTER — Encounter: Payer: Self-pay | Admitting: Physical Therapy

## 2021-02-27 DIAGNOSIS — M6281 Muscle weakness (generalized): Secondary | ICD-10-CM

## 2021-02-27 DIAGNOSIS — R2681 Unsteadiness on feet: Secondary | ICD-10-CM

## 2021-02-27 DIAGNOSIS — R208 Other disturbances of skin sensation: Secondary | ICD-10-CM

## 2021-02-27 DIAGNOSIS — R2689 Other abnormalities of gait and mobility: Secondary | ICD-10-CM

## 2021-02-27 NOTE — Patient Instructions (Signed)
° °  Aquatic Therapy: What to Expect!  Where:  [facility] at [facility] [address] [place], [place] [address] [phone]           How to Prepare: Please make sure you drink 8 ounces of water about one hour prior to your pool session A caregiver must attend the entire session with the patient (unless your primary therapists feels this is not necessary). The caregiver will be responsible for assisting with dressing as well as any toileting needs.  Please arrive IN YOUR SUIT and a few minutes prior to your appointment - this helps to avoid delays in starting your session. Please make sure to attend to any toileting needs prior to entering the pool Once on the pool deck your therapist will ask you to sign the Patient  Consent and Assignment of Benefits form Your therapist may take your blood pressure prior to, during and after your session if indicated We usually try and create a home exercise program based on activities we do in the pool.  Please be thinking about who might be able to assist you in the pool should you want to participate in an aquatic home exercise program at the time of discharge.  Some patients do not want to or do not have the ability to participate in an aquatic home program - this is not a barrier in any way to you participating in aquatic therapy as part of your current therapy plan!    About the pool: Entering the pool Your therapist will assist you; there are multiple ways to enter including stairs with railings, a walk in ramp, a roll in chair and a mechanical lift. Your therapist will determine the most appropriate way for you. Water temperature is usually between 94-96 degrees There may be other swimmers in the pool at the same time     Contact Info:             Appointments: [facility]         All sessions are 45 minutes   [place]            Please call the [facility]  if   [place],   [phone]           you need to cancel or reschedule an appointment.  [place], PTA

## 2021-02-27 NOTE — Therapy (Signed)
[facility] [address] [place], [place], [address] Phone: [phone]   Fax:  [phone]  Physical Therapy Treatment  Patient Details  Name: Connie Haynes MRN: [id number] Date of Birth: 16-Jun-1955 Referring Provider (PT): Arlice Colt, MD   Encounter Date: 02/27/2021   PT End of Session - 02/27/21 0720     Visit Number 9    Number of Visits 17    Date for PT Re-Evaluation 03/15/21    Authorization Type [employer] Medicare (10th visit PN needed)    Progress Note Due on Visit 10    PT Start Time 0718    PT Stop Time 0800    PT Time Calculation (min) 42 min    Equipment Utilized During Treatment Gait belt    Activity Tolerance Patient tolerated treatment well    Behavior During Therapy WFL for tasks assessed/performed             Past Medical History:  Diagnosis Date   Adrenal insufficiency ([place])    Anemia    denies   Anxiety    Basal cell carcinoma    eye lid   Chronic kidney disease, stage 3 (HCC)    Cough    hx. dry cough chronic   Disorder of tear duct system    frequent tearing of eyes   GERD (gastroesophageal reflux disease)    H/O: whooping cough 2000   History of kidney infection 2015   ? sepsis   History of kidney stones    History of kidney stones    Hyperlipidemia    Hypertension    IBS (irritable bowel syndrome)    controlled with med   Osteoarthritis    arthritis -joints-limited ROM shoulders more on right   PONV (postoperative nausea and vomiting)     per pt hx of low BP following shoulder surgery in 05-2015; was managed in hospital with IV fluids and d/c BP being taken a tthe time; pt was dced and F/U with endcrinoopgist who dx with adrenal insurffiency and started steroid therapy; treatmetnt with positive outome, now managed chronically on hydrocortisone tablets;   Sleep apnea    no longer using cpap   Stroke [facility])    per patient "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 Hulen Skains)   Opp     '93 for WPW syndrome   CARPAL TUNNEL RELEASE Right 12/21/2014   Procedure: RIGHT CARPAL TUNNEL RELEASE ;  Surgeon: Netta Cedars, MD;  Location: [facility];  Service: Orthopedics;  Laterality: Right;   CARPAL TUNNEL RELEASE Left    CERVICAL SPINE SURGERY     cervical fusion with hardware retained   COLON SURGERY     "colon detached from abdomen"   COLONOSCOPY WITH PROPOFOL N/A 05/10/2014   Procedure: COLONOSCOPY WITH PROPOFOL;  Surgeon: Juanita Craver, MD;  Location: [facility];  Service: Endoscopy;  Laterality: N/A;   CYSTOSCOPY/RETROGRADE/URETEROSCOPY Left 12/18/2013   Procedure: CYSTOSCOPY/RETROGRADE/LEFT STENT;  Surgeon: Malka So, MD;  Location: [facility];  Service: Urology;  Laterality: Left;   ESOPHAGOGASTRODUODENOSCOPY (EGD) WITH PROPOFOL N/A 05/10/2014   Procedure: ESOPHAGOGASTRODUODENOSCOPY (EGD) WITH PROPOFOL;  Surgeon: Juanita Craver, MD;  [facility]: [facility];  Service: Endoscopy;  Laterality: N/A;   EYELID CARCINOMA EXCISION     INSERTION OF DIALYSIS CATHETER N/A 08/16/2019   Procedure: INSERTION OF DIALYSIS CATHETER;  Surgeon: Garner Nash, DO;  Location: [facility];  Service: Pulmonary;  Laterality: N/A;   JOINT REPLACEMENT     KNEE ARTHROSCOPY Left 07/16/2016   REVERSE SHOULDER ARTHROPLASTY Right 06/29/2014   Procedure: REVERSE TOTAL SHOULDER ARTHROPLASTY;  Surgeon: Netta Cedars, MD;  Location: [facility];  Service: Orthopedics;  Laterality: Right;   REVISION TOTAL SHOULDER TO REVERSE TOTAL SHOULDER Left 05/15/2015    REVISION TOTAL SHOULDER TO REVERSE TOTAL SHOULDER Left 05/15/2015   Procedure: LEFT SHOULDER REVISION TOTAL SHOULDER ARTHRPLASTY TO REVERSE TOTAL SHOULDER ARTHROPLASTY ;  Surgeon: Netta Cedars, MD;  Location: [facility];  Service: Orthopedics;  Laterality: Left;   tear duct surgery     TOTAL ABDOMINAL HYSTERECTOMY     TOTAL HIP ARTHROPLASTY     x 3(rt x2, lt x1)   TOTAL KNEE ARTHROPLASTY Right 08/08/2013   Procedure: RIGHT TOTAL KNEE ARTHROPLASTY WITH SAPHENOUS NERVE SCAR EXCISION;  Surgeon: Mauri Pole, MD;  Location[facility];  Service: Orthopedics;  Laterality: Right;   TOTAL KNEE ARTHROPLASTY Left 08/25/2016   Procedure: LEFT TOTAL KNEE ARTHROPLASTY;  Surgeon: Paralee Cancel, MD;  [facility];  Service: Orthopedics;  Laterality: Left;  70 mins; Adductor block   TOTAL SHOULDER ARTHROPLASTY Left 07/15/2012   Procedure: LEFT TOTAL SHOULDER ARTHROPLASTY;  Surgeon: Augustin Schooling, MD;  Location: [facility];  Service: Orthopedics;  Laterality: Left;   TOTAL SHOULDER REVISION Right 12/21/2014   Procedure: RIGHT SHOULDER POLY EXCHANGE ;  Surgeon: Netta Cedars, MD;  Location: [facility];  Service: Orthopedics;  Laterality: Right;   URETEROSCOPY  stent placement    There were no vitals filed for this visit.   Subjective Assessment - 02/27/21 0719     Subjective No new complaints. No falls or pain to report. Continues with left upper trap soreness, heat is helping and Advil makes it go away for a little while.    Pertinent History Adrenal insufficiency, HTN, wearing 2.5L O2 only at night.    Limitations Walking;Standing;House hold activities    Patient Stated Goals To work on my balance and walking    Currently in Pain? No/denies    Pain Score 0-No pain                   [facility] Adult PT Treatment/Exercise - 02/27/21 0723       Transfers   Transfers Sit to Stand;Stand to Sit    Sit to Stand 6: Modified independent (Device/Increase time)    Stand to Sit 6: Modified independent (Device/Increase  time)      Ambulation/Gait   Ambulation/Gait Yes    Ambulation/Gait Assistance 5: Supervision;4: Min guard    Ambulation Distance (Feet) --   around clinic with session   Assistive device None    Gait Pattern Step-through pattern;Decreased stride length;Decreased arm swing - right;Decreased arm swing - left;Trunk flexed;Wide base of support    Ambulation Surface Level;Indoor      High Level Balance   High Level Balance Activities Side stepping;Marching forwards;Backward walking    High Level Balance Comments blue mat in parallel bars with light UE support for 4 laps each, min guard assist for  safety with cues on form/technique      Knee/Hip Exercises: Aerobic   Other Aerobic Scifit LE's only level 4.0 x 8 minutes with goal >/= 60 steps per minute for strengthening and activitiy tolerance.      Knee/Hip Exercises: Standing   Heel Raises Both;1 set;10 reps;Limitations    Heel Raises Limitations with UE support on sturdy surface with limited range noted    Hip Abduction AROM;Stengthening;Both;1 set;10 reps;Knee straight;Limitations    Abduction Limitations alternating to work on weight shifting with UE support on sturdy surface, cues on posture/staying upright.      Knee/Hip Exercises: Seated   Long Arc Quad AROM;Strengthening;Both;10 reps;Limitations;2 sets    [employer] Weight 2 lbs.    Long [employer] Limitations cues for slow and controlled movements with  3 sec holds each rep    Marching AROM;Strengthening;Both;10 reps;Weights;Limitations;2 sets    Marching Limitations cues on posture    Marching Weights 2 lbs.                     PT Education - 02/27/21 1954     Education Details pt given aquatic information handout    Person(s) Educated Patient    Methods Explanation;Handout    Comprehension Verbalized understanding              PT Short Term Goals - 02/18/21 1128       PT SHORT TERM GOAL #1   Title Pt will  be IND w/ initial HEP in order to indicate  improved functional mobility and dec fall risk.  (Target Date: 02/13/21)    Baseline 02/18/21: met with current program    Status Achieved    Target Date 02/13/21      PT SHORT TERM GOAL #2   Title Pt will complete BERG balance and improve score by 6 points from baseline in order to indicate dec fall risk.    Baseline 02/18/21: 46/56, 4 point improvement from baseline score of 42/56    Status Partially Met      PT SHORT TERM GOAL #3   Title Pt will improve gait speed to >/=1.92 ft/sec with device (if needed) in order to indicate dec fall risk.    Baseline 02/18/21: 2.57 ft/sec with no AD    Status Achieved      PT SHORT TERM GOAL #4   Title Willl assess appropriateness for use of AD with gait to determine if safe for use at home/in community.    Baseline 02/18/21: RW has been trialed with decreased safety noted    Status Achieved      PT SHORT TERM GOAL #5   Title Will perform TUG and write appropriate LTG to reflect dec fall risk.    Baseline 01/22/21: 11.47 sec's no AD Normal TUG, PT to set goal    Status Partially Met               PT Long Term Goals - 01/22/21 0957       PT LONG TERM GOAL #1   Title Pt will be IND with final HEP in order to indicate improved functional mobility and dec fall risk. (Target Date: 03/15/21)    Time 8    Period Weeks    Status New    Target Date 03/15/21      PT LONG TERM GOAL #2   Title Pt will improve BERG balance score by 10 points from baseline in order to indicate dec fall risk.  Baseline 01/22/21: 42/56 scored as baseline    Time 8    Period Weeks    Status New      PT LONG TERM GOAL #3   Title Pt will ambulate with gait speed >/=2.5 ft/sec with device (if needed) in order to indicate dec fall risk.    Time 8    Period Weeks    Status New      PT LONG TERM GOAL #4   Title Pt will ambulate 300' w/ LRAD over unlevel paved surfaces (with device if needed) at S level in order to indicate improved community safety.    Time 8     Period Weeks    Status New                   Plan - 02/27/21 8304     Clinical Impression Statement Today's skilled session continued to focus on strengthening and balance training with rest breaks taken as needed due to fatigue. No other issues were noted or reported in session. Also gave pt information on aquatics with hopeful plan to start in Feb. The pt is making steady progress toward goals and should benefit from continued PT to progress toward unmet goals.    Personal Factors and Comorbidities Comorbidity 3+;Time since onset of injury/illness/exacerbation    Comorbidities see above    Examination-Activity Limitations Dressing;Locomotion Level;Squat;Stairs;Stand;Lift;Transfers    Examination-Participation Restrictions Cleaning;Community Activity;Driving;Laundry;Shop;Meal Prep    Stability/Clinical Decision Making Evolving/Moderate complexity    Rehab Potential Good    PT Frequency 2x / week    PT Duration 8 weeks    PT Treatment/Interventions ADLs/Self Care Home Management;Aquatic Therapy;DME Instruction;Gait training;Stair training;Functional mobility training;Therapeutic activities;Therapeutic exercise;Balance training;Neuromuscular re-education;Patient/family education;Orthotic Fit/Training;Energy conservation;Vestibular    PT Next Visit Plan add/advance balance ex's on HEP; continue to work on strengthening and balance training    PT Home Exercise Plan Access Code: [email or web address]    Consulted and Agree with Plan of Care Patient             Patient will benefit from skilled therapeutic intervention in order to improve the following deficits and impairments:  Abnormal gait, Cardiopulmonary status limiting activity, Decreased activity tolerance, Decreased balance, Decreased coordination, Decreased endurance, Decreased knowledge of use of DME, Decreased mobility, Decreased strength, Difficulty walking, Impaired perceived functional ability, Impaired sensation, Postural  dysfunction  Visit Diagnosis: Unsteadiness on feet  Other abnormalities of gait and mobility  Muscle weakness (generalized)  Other disturbances of skin sensation     Problem List Patient Active Problem List   Diagnosis Date Noted   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HCC) 10/30/2020   Encounter for immunotherapy 10/30/2020   History of optic neuritis 10/30/2020   Urinary retention 07/17/2020   Encounter for monitoring immunomodulating therapy 05/02/2020   Numbness 08/29/2019   Gait disturbance 08/29/2019   Abnormal brain MRI 08/29/2019   High risk medication use 08/29/2019   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HCC) 08/11/2019   Adrenal insufficiency (Addison's disease) (HCC) 08/11/2019   S/P left TKA 08/25/2016   S/P total knee replacement 08/25/2016   S/P shoulder replacement 06/29/2014   OSA (obstructive sleep apnea) 01/22/2014   Left ureteral stone 12/18/2013   UTI (urinary tract infection) 12/18/2013   Morbid obesity (HCC) 08/09/2013   S/P right TKA 08/08/2013   Chronic pain syndrome 03/04/2013   Depressive disorder, not elsewhere classified 03/04/2013   Need for prophylactic vaccination and inoculation against influenza 10/04/2012   Erosive osteoarthritis of multiple sites 10/04/2012   Chronic cough 10/04/2012  Swelling of limb 08/21/2012   Essential hypertension, benign 08/21/2012   Preoperative general physical examination 06/12/2012   Restless leg syndrome 06/12/2012   Anxiety state, unspecified 06/12/2012   Lung nodule 07/16/2011   Cough 04/21/2011   Wolff-Parkinson-White (WPW) syndrome    Osteoarthritis    Hyperlipidemia    IBS (irritable bowel syndrome)    Kidney stones     Willow Ora, PTA, [facility] [address] [place], [place] [address] [phone] 02/27/21, 8:11 PM   Name: Connie Haynes MRN: [id number] Date of Birth: 02/09/1956

## 2021-03-04 ENCOUNTER — Encounter: Payer: Self-pay | Admitting: Rehabilitation

## 2021-03-04 ENCOUNTER — Other Ambulatory Visit: Payer: Self-pay

## 2021-03-04 ENCOUNTER — Ambulatory Visit: Payer: Medicare Other | Admitting: Rehabilitation

## 2021-03-04 DIAGNOSIS — M6281 Muscle weakness (generalized): Secondary | ICD-10-CM

## 2021-03-04 DIAGNOSIS — R208 Other disturbances of skin sensation: Secondary | ICD-10-CM

## 2021-03-04 DIAGNOSIS — R2681 Unsteadiness on feet: Secondary | ICD-10-CM | POA: Diagnosis not present

## 2021-03-04 DIAGNOSIS — R2689 Other abnormalities of gait and mobility: Secondary | ICD-10-CM

## 2021-03-04 NOTE — Therapy (Signed)
[facility] [address] [place], [place], [address] Phone: [phone]   Fax:  [phone]  Physical Therapy Treatment  Patient Details  Name: Connie Haynes MRN: [id number] Date of Birth: 1955-10-07 Referring Provider (PT): Arlice Colt, MD   Encounter Date: 03/04/2021   PT End of Session - 03/04/21 1314     Visit Number 10    Number of Visits 17    Date for PT Re-Evaluation 03/15/21    Authorization Type [employer] Medicare (10th visit PN needed)    Progress Note Due on Visit 20    PT Start Time [phone]    PT Stop Time 0930    PT Time Calculation (min) 44 min    Equipment Utilized During Treatment Gait belt    Activity Tolerance Patient tolerated treatment well    Behavior During Therapy WFL for tasks assessed/performed             Past Medical History:  Diagnosis Date   Adrenal insufficiency ([place])    Anemia    denies   Anxiety    Basal cell carcinoma    eye lid   Chronic kidney disease, stage 3 (HCC)    Cough    hx. dry cough chronic   Disorder of tear duct system    frequent tearing of eyes   GERD (gastroesophageal reflux disease)    H/O: whooping cough 2000   History of kidney infection 2015   ? sepsis   History of kidney stones    History of kidney stones    Hyperlipidemia    Hypertension    IBS (irritable bowel syndrome)    controlled with med   Osteoarthritis    arthritis -joints-limited ROM shoulders more on right   PONV (postoperative nausea and vomiting)     per pt hx of low BP following shoulder surgery in 05-2015; was managed in hospital with IV fluids and d/c BP being taken a tthe time; pt was dced and F/U with endcrinoopgist who dx with adrenal insurffiency and started steroid therapy; treatmetnt with positive outome, now managed chronically on hydrocortisone tablets;   Sleep apnea    no longer using cpap   Stroke [facility])    per patient "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 Hulen Skains)   [place]     '93 for WPW syndrome   CARPAL TUNNEL RELEASE Right 12/21/2014   Procedure: RIGHT CARPAL TUNNEL RELEASE ;  Surgeon: Netta Cedars, MD;  Location: [facility];  Service: Orthopedics;  Laterality: Right;   CARPAL TUNNEL RELEASE Left    CERVICAL SPINE SURGERY     cervical fusion with hardware retained   COLON SURGERY     "colon detached from abdomen"   COLONOSCOPY WITH PROPOFOL N/A 05/10/2014   Procedure: COLONOSCOPY WITH PROPOFOL;  Surgeon: Juanita Craver, MD;  Location: [facility];  Service: Endoscopy;  Laterality: N/A;   CYSTOSCOPY/RETROGRADE/URETEROSCOPY Left 12/18/2013   Procedure: CYSTOSCOPY/RETROGRADE/LEFT STENT;  Surgeon: Malka So, MD;  Location: [facility];  Service: Urology;  Laterality: Left;   ESOPHAGOGASTRODUODENOSCOPY (EGD) WITH PROPOFOL N/A 05/10/2014   Procedure: ESOPHAGOGASTRODUODENOSCOPY (EGD) WITH PROPOFOL;  Surgeon: Juanita Craver, MD;  [facility];  Service: Endoscopy;  Laterality: N/A;   EYELID CARCINOMA EXCISION     INSERTION OF DIALYSIS CATHETER N/A 08/16/2019   Procedure: INSERTION OF DIALYSIS CATHETER;  Surgeon: Garner Nash, DO;  Location: [facility];  Service: Pulmonary;  Laterality: N/A;   JOINT REPLACEMENT     KNEE ARTHROSCOPY Left 07/16/2016   REVERSE SHOULDER ARTHROPLASTY Right 06/29/2014   Procedure: REVERSE TOTAL SHOULDER ARTHROPLASTY;  Surgeon: Netta Cedars, MD;  Location: [facility];  Service: Orthopedics;  Laterality: Right;   REVISION TOTAL SHOULDER TO REVERSE TOTAL SHOULDER Left  05/15/2015   REVISION TOTAL SHOULDER TO REVERSE TOTAL SHOULDER Left 05/15/2015   Procedure: LEFT SHOULDER REVISION TOTAL SHOULDER ARTHRPLASTY TO REVERSE TOTAL SHOULDER ARTHROPLASTY ;  Surgeon: Netta Cedars, MD;  Location: [facility];  Service: Orthopedics;  Laterality: Left;   tear duct surgery     TOTAL ABDOMINAL HYSTERECTOMY     TOTAL HIP ARTHROPLASTY     x 3(rt x2, lt x1)   TOTAL KNEE ARTHROPLASTY Right 08/08/2013   Procedure: RIGHT TOTAL KNEE ARTHROPLASTY WITH SAPHENOUS NERVE SCAR EXCISION;  Surgeon: Mauri Pole, MD;  [facility];  Service: Orthopedics;  Laterality: Right;   TOTAL KNEE ARTHROPLASTY Left 08/25/2016   Procedure: LEFT TOTAL KNEE ARTHROPLASTY;  Surgeon: Paralee Cancel, MD;  [facility];  Service: Orthopedics;  Laterality: Left;  70 mins; Adductor block   TOTAL SHOULDER ARTHROPLASTY Left 07/15/2012   Procedure: LEFT TOTAL SHOULDER ARTHROPLASTY;  Surgeon: Augustin Schooling, MD;  Location: [facility];  Service: Orthopedics;  Laterality: Left;   TOTAL SHOULDER REVISION Right 12/21/2014   Procedure: RIGHT SHOULDER POLY EXCHANGE ;  Surgeon: Netta Cedars, MD;  Location: [facility];  Service: Orthopedics;  Laterality: Right;   URETEROSCOPY  stent placement    There were no vitals filed for this visit.   Subjective Assessment - 03/04/21 0849     Subjective No compliants, does report she is doing better at home.    Pertinent History Adrenal insufficiency, HTN, wearing 2.5L O2 only at night.    Patient Stated Goals To work on my balance and walking    Currently in Pain? Yes    Pain Score 4     Pain Location Neck    Pain Orientation Left    Pain Descriptors / Indicators Tightness;Sore    Pain Type Acute pain    Pain Onset 1 to 4 weeks ago    Pain Frequency Constant    Aggravating Factors  muscle tensing, positioning    Pain Relieving Factors rest, heat                               [facility] Adult PT Treatment/Exercise - 03/04/21 0858       Transfers   Transfers  Sit to Stand;Stand to Sit    Sit to Stand 6: Modified independent (Device/Increase time)    Stand to Sit 6: Modified independent (Device/Increase time)    Comments Performed from mat without UE support without difficulty, therefore had her perform from 14" block which she was able to do but was somewhat reluctant due to fear of falling.  PT added airex to increase height and she was able to do with adequate LE challenge.  Discussed her doing at home for strengthening  from lower surface.      Ambulation/Gait   Ambulation/Gait Yes    Ambulation/Gait Assistance 5: Supervision    Ambulation/Gait Assistance Details From task to task within session.  She continues to have wider BOS, but gait speed has improved greatly from evaluation.  It was slightly less than STG assessment today at 2.47 ft/sec    Ambulation Distance (Feet) 200 Feet    Assistive device None    Gait Pattern Step-through pattern;Decreased stride length;Decreased arm swing - right;Decreased arm swing - left;Trunk flexed;Wide base of support    Ambulation Surface Level;Indoor    Gait velocity 2.47 ft/sec      Therapeutic Activites    Therapeutic Activities Other Therapeutic Activities    Other Therapeutic Activities Pt reporting that she does wish she could get up from floor.  We did perform during session with her getting into floor at min A level, however PT had demonstrated getting to half kneel then up to mat, and she just pushed up into an almost plank type position and then to mat and did not want to do again this session.      Neuro Re-ed    Neuro Re-ed Details  Reviewed and updated HEP, see pt instructions for details.              Access Code: [email or web address] URL: [email or web address] Date: 03/04/2021 Prepared by: Harriet Butte  Exercises Standing March with Counter Support - 1 x daily - 5 x weekly - 1 sets - 10 reps Standing Single Leg Stance with Counter Support - 1 x daily - 5 x weekly - 1 sets - 3  reps - 10 hold Side Stepping with Counter Support - 1 x daily - 5 x weekly - 1 sets - 3 reps Romberg Stance - 1 x daily - 7 x weekly - 3 sets - 10 reps Wide Stance with Head Rotations and Unilateral Counter Support - 1-2 x daily - 7 x weekly - 1 sets - 10 reps Wide Stance with Head Nods and Counter Support - 1-2 x daily - 7 x weekly - 2 sets - 5 reps        PT Education - 03/04/21 1310     Education Details Discussion regarding aquatic therapy and her concerns and do not feel that she would like to persue aquatic PT at this time.    Person(s) Educated Patient    Methods Explanation    Comprehension Verbalized understanding              PT Short Term Goals - 02/18/21 1128       PT SHORT TERM GOAL #1   Title Pt will  be IND w/ initial HEP in order to indicate improved functional mobility and dec fall risk.  (Target Date: 02/13/21)    Baseline 02/18/21: met with current program    Status Achieved    Target Date 02/13/21      PT SHORT TERM GOAL #2   Title Pt will complete BERG balance and improve score by 6 points from baseline in order to indicate dec fall risk.    Baseline 02/18/21: 46/56, 4 point improvement from baseline score of 42/56    Status Partially Met      PT SHORT TERM GOAL #3   Title Pt will improve gait speed to >/=1.92 ft/sec with device (if needed) in order to indicate dec fall risk.    Baseline 02/18/21: 2.57 ft/sec with no AD    Status Achieved  PT SHORT TERM GOAL #4   Title Willl assess appropriateness for use of AD with gait to determine if safe for use at home/in community.    Baseline 02/18/21: RW has been trialed with decreased safety noted    Status Achieved      PT SHORT TERM GOAL #5   Title Will perform TUG and write appropriate LTG to reflect dec fall risk.    Baseline 01/22/21: 11.47 sec's no AD Normal TUG, PT to set goal    Status Partially Met               PT Long Term Goals - 01/22/21 0957       PT LONG TERM GOAL #1   Title Pt  will be IND with final HEP in order to indicate improved functional mobility and dec fall risk. (Target Date: 03/15/21)    Time 8    Period Weeks    Status New    Target Date 03/15/21      PT LONG TERM GOAL #2   Title Pt will improve BERG balance score by 10 points from baseline in order to indicate dec fall risk.    Baseline 01/22/21: 42/56 scored as baseline    Time 8    Period Weeks    Status New      PT LONG TERM GOAL #3   Title Pt will ambulate with gait speed >/=2.5 ft/sec with device (if needed) in order to indicate dec fall risk.    Time 8    Period Weeks    Status New      PT LONG TERM GOAL #4   Title Pt will ambulate 300' w/ LRAD over unlevel paved surfaces (with device if needed) at S level in order to indicate improved community safety.    Time 8    Period Weeks    Status New                   Plan - 03/04/21 1315     Clinical Impression Statement Skilled session focused on assessment of gait speed which was 2.47 ft/sec today an improvement from 1.32 ft/sec on day of evaluation.  We also were able to update and increase challenge of her HEP exercises today.  Pt is progressing very well.    Personal Factors and Comorbidities Comorbidity 3+;Time since onset of injury/illness/exacerbation    Comorbidities see above    Examination-Activity Limitations Dressing;Locomotion Level;Squat;Stairs;Stand;Lift;Transfers    Examination-Participation Restrictions Cleaning;Community Activity;Driving;Laundry;Shop;Meal Prep    Stability/Clinical Decision Making Evolving/Moderate complexity    Rehab Potential Good    PT Frequency 2x / week    PT Duration 8 weeks    PT Treatment/Interventions ADLs/Self Care Home Management;Aquatic Therapy;DME Instruction;Gait training;Stair training;Functional mobility training;Therapeutic activities;Therapeutic exercise;Balance training;Neuromuscular re-education;Patient/family education;Orthotic Fit/Training;Energy conservation;Vestibular    PT  Next Visit Plan New exercises going ok??; continue to work on strengthening and balance training, gait outdoors, stepping strategy    PT Home Exercise Plan Access Code: [email or web address]    Consulted and Agree with Plan of Care Patient             Patient will benefit from skilled therapeutic intervention in order to improve the following deficits and impairments:  Abnormal gait, Cardiopulmonary status limiting activity, Decreased activity tolerance, Decreased balance, Decreased coordination, Decreased endurance, Decreased knowledge of use of DME, Decreased mobility, Decreased strength, Difficulty walking, Impaired perceived functional ability, Impaired sensation, Postural dysfunction  Visit Diagnosis: Unsteadiness on feet  Other abnormalities of gait and  mobility  Muscle weakness (generalized)  Other disturbances of skin sensation     Problem List Patient Active Problem List   Diagnosis Date Noted   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   Adrenal insufficiency (Addison's disease) ([place]) 08/11/2019   S/P left TKA 08/25/2016   S/P total knee replacement 08/25/2016   S/P shoulder replacement 06/29/2014   OSA (obstructive sleep apnea) 01/22/2014   Left ureteral stone 12/18/2013   UTI (urinary tract infection) 12/18/2013   Morbid obesity ([place]) 08/09/2013   S/P right TKA 08/08/2013   Chronic pain syndrome 03/04/2013   Depressive disorder, not elsewhere classified 03/04/2013   Need for prophylactic vaccination and inoculation against influenza 10/04/2012   Erosive osteoarthritis of multiple sites 10/04/2012   Chronic cough 10/04/2012   Swelling of limb  08/21/2012   Essential hypertension, benign 08/21/2012   Preoperative general physical examination 06/12/2012   Restless leg syndrome 06/12/2012   Anxiety state, unspecified 06/12/2012   Lung nodule 07/16/2011   Cough 04/21/2011   Wolff-Parkinson-White (WPW) syndrome    Osteoarthritis    Hyperlipidemia    IBS (irritable bowel syndrome)    Kidney stones     Cameron Sprang, PT, MPT [facility] [address] [place], [place], [address] Phone: [phone]   Fax:  [phone] 03/04/21, 1:19 PM   Name: Connie Haynes MRN: [id number] Date of Birth: [birth year]

## 2021-03-06 ENCOUNTER — Ambulatory Visit: Payer: Medicare Other | Admitting: Physical Therapy

## 2021-03-07 ENCOUNTER — Ambulatory Visit: Payer: Medicare Other | Admitting: Physical Therapy

## 2021-03-11 ENCOUNTER — Encounter: Payer: Self-pay | Admitting: Physical Therapy

## 2021-03-11 ENCOUNTER — Other Ambulatory Visit: Payer: Self-pay

## 2021-03-11 ENCOUNTER — Ambulatory Visit: Payer: Medicare Other | Admitting: Physical Therapy

## 2021-03-11 DIAGNOSIS — M6281 Muscle weakness (generalized): Secondary | ICD-10-CM

## 2021-03-11 DIAGNOSIS — R2689 Other abnormalities of gait and mobility: Secondary | ICD-10-CM

## 2021-03-11 DIAGNOSIS — R2681 Unsteadiness on feet: Secondary | ICD-10-CM

## 2021-03-11 DIAGNOSIS — R208 Other disturbances of skin sensation: Secondary | ICD-10-CM

## 2021-03-11 NOTE — Therapy (Signed)
[facility] [address] [place], [place], [address] Phone: [phone]   Fax:  [phone]  Physical Therapy Treatment  Patient Details  Name: Connie Haynes MRN: [id number] Date of Birth: [birth year] Referring Provider (PT): Arlice Colt, MD   Encounter Date: 03/11/2021   PT End of Session - 03/11/21 0854     Visit Number 11    Number of Visits 17    Date for PT Re-Evaluation 03/15/21    Authorization Type [employer] Medicare (10th visit PN needed)    Progress Note Due on Visit 20    PT Start Time 0849    PT Stop Time 0930    PT Time Calculation (min) 41 min    Equipment Utilized During Treatment Gait belt    Activity Tolerance Patient tolerated treatment well    Behavior During Therapy WFL for tasks assessed/performed             Past Medical History:  Diagnosis Date   Adrenal insufficiency ([place])    Anemia    denies   Anxiety    Basal cell carcinoma    eye lid   Chronic kidney disease, stage 3 (HCC)    Cough    hx. dry cough chronic   Disorder of tear duct system    frequent tearing of eyes   GERD (gastroesophageal reflux disease)    H/O: whooping cough 2000   History of kidney infection 2015   ? sepsis   History of kidney stones    History of kidney stones    Hyperlipidemia    Hypertension    IBS (irritable bowel syndrome)    controlled with med   Osteoarthritis    arthritis -joints-limited ROM shoulders more on right   PONV (postoperative nausea and vomiting)     per pt hx of low BP following shoulder surgery in 05-2015; was managed in hospital with IV fluids and d/c BP being taken a tthe time; pt was dced and F/U with endcrinoopgist who dx with adrenal insurffiency and started steroid therapy; treatmetnt with positive outome, now managed chronically on hydrocortisone tablets;   Sleep apnea    no longer using cpap   Stroke [facility])    per patient "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 Hulen Skains)   [facility]     '93 for WPW syndrome   CARPAL TUNNEL RELEASE Right 12/21/2014   Procedure: RIGHT CARPAL TUNNEL RELEASE ;  Surgeon: Netta Cedars, MD;  Location: [facility];  Service: Orthopedics;  Laterality: Right;   CARPAL TUNNEL RELEASE Left    CERVICAL SPINE SURGERY     cervical fusion with hardware retained   COLON SURGERY     "colon detached from abdomen"   COLONOSCOPY WITH PROPOFOL N/A 05/10/2014   Procedure: COLONOSCOPY WITH PROPOFOL;  Surgeon: Juanita Craver, MD;  Location: [facility];  Service: Endoscopy;  Laterality: N/A;   CYSTOSCOPY/RETROGRADE/URETEROSCOPY Left 12/18/2013   Procedure: CYSTOSCOPY/RETROGRADE/LEFT STENT;  Surgeon: Malka So, MD;  Location: [facility];  Service: Urology;  Laterality: Left;   ESOPHAGOGASTRODUODENOSCOPY (EGD) WITH PROPOFOL N/A 05/10/2014   Procedure: ESOPHAGOGASTRODUODENOSCOPY (EGD) WITH PROPOFOL;  Surgeon: Juanita Craver, MD;  [facility];  Service: Endoscopy;  Laterality: N/A;   EYELID CARCINOMA EXCISION     INSERTION OF DIALYSIS CATHETER N/A 08/16/2019   Procedure: INSERTION OF DIALYSIS CATHETER;  Surgeon: Garner Nash, DO;  Location: [facility];  Service: Pulmonary;  Laterality: N/A;   JOINT REPLACEMENT     KNEE ARTHROSCOPY Left 07/16/2016   REVERSE SHOULDER ARTHROPLASTY Right 06/29/2014   Procedure: REVERSE TOTAL SHOULDER ARTHROPLASTY;  Surgeon: Netta Cedars, MD;  Location: [facility];  Service: Orthopedics;  Laterality: Right;   REVISION TOTAL SHOULDER TO REVERSE TOTAL SHOULDER Left  05/15/2015   REVISION TOTAL SHOULDER TO REVERSE TOTAL SHOULDER Left 05/15/2015   Procedure: LEFT SHOULDER REVISION TOTAL SHOULDER ARTHRPLASTY TO REVERSE TOTAL SHOULDER ARTHROPLASTY ;  Surgeon: Netta Cedars, MD;  Location: [facility];  Service: Orthopedics;  Laterality: Left;   tear duct surgery     TOTAL ABDOMINAL HYSTERECTOMY     TOTAL HIP ARTHROPLASTY     x 3(rt x2, lt x1)   TOTAL KNEE ARTHROPLASTY Right 08/08/2013   Procedure: RIGHT TOTAL KNEE ARTHROPLASTY WITH SAPHENOUS NERVE SCAR EXCISION;  Surgeon: Mauri Pole, MD;  [facility];  Service: Orthopedics;  Laterality: Right;   TOTAL KNEE ARTHROPLASTY Left 08/25/2016   Procedure: LEFT TOTAL KNEE ARTHROPLASTY;  Surgeon: Paralee Cancel, MD;  [facility];  Service: Orthopedics;  Laterality: Left;  70 mins; Adductor block   TOTAL SHOULDER ARTHROPLASTY Left 07/15/2012   Procedure: LEFT TOTAL SHOULDER ARTHROPLASTY;  Surgeon: Augustin Schooling, MD;  Location: [facility];  Service: Orthopedics;  Laterality: Left;   TOTAL SHOULDER REVISION Right 12/21/2014   Procedure: RIGHT SHOULDER POLY EXCHANGE ;  Surgeon: Netta Cedars, MD;  Location: [facility];  Service: Orthopedics;  Laterality: Right;   URETEROSCOPY  stent placement    There were no vitals filed for this visit.   Subjective Assessment - 03/11/21 0851     Subjective No new complaints. No falls. Still with left upper trap/neck sorenss. Tried the cane for trigger point release as instructed last session which made it worse. Heat helps for short durations, does not take pain away. Tried the new ex's on Sunday, the head movements up<>down is still really hard.    Pertinent History Adrenal insufficiency, HTN, wearing 2.5L O2 only at night.    Limitations Walking;Standing;House hold activities    Patient Stated Goals To work on my balance and walking    Currently in Pain? Yes    Pain Score 4     Pain Location Neck    Pain Orientation Left    Pain Descriptors / Indicators Sore;Tightness    Pain Type Acute  pain    Pain Onset 1 to 4 weeks ago    Pain Frequency Constant    Aggravating Factors  positioning, muscle tensing    Pain Relieving Factors rest, heat                [facility] PT Assessment - 03/11/21 0857       Berg Balance Test   Sit to Stand Able to stand without using hands and stabilize independently    Standing Unsupported Able to stand safely 2 minutes    Sitting with Back Unsupported but Feet Supported on Floor or Stool Able to sit safely and securely 2 minutes    Stand to Sit Sits safely with  minimal use of hands    Transfers Able to transfer safely, minor use of hands    Standing Unsupported with Eyes Closed Able to stand 10 seconds safely    Standing Unsupported with Feet Together Able to place feet together independently and stand 1 minute safely    From Standing, Reach Forward with Outstretched Arm Can reach confidently >25 cm (10")    From Standing Position, Pick up Object from Floor Able to pick up shoe safely and easily    From Standing Position, Turn to Look Behind Over each Shoulder Looks behind from both sides and weight shifts well    Turn 360 Degrees Able to turn 360 degrees safely in 4 seconds or less    Standing Unsupported, Alternately Place Feet on Step/Stool Able to stand independently and complete 8 steps >20 seconds   22.90 sec's   Standing Unsupported, One Foot in Front Able to take small step independently and hold 30 seconds   pt did have 2 episodes of flinging arms back as if loosing balance, however hips/feet did not move with no balance loss noted, supervision/min guard assist for safety with no physical assistance needed.   Standing on One Leg Tries to lift leg/unable to hold 3 seconds but remains standing independently    Total Score 50    Berg comment: 46-51 Moderate                  [facility] Adult PT Treatment/Exercise - 03/11/21 0857       Transfers   Transfers Sit to Stand;Stand to Sit    Sit to Stand 6: Modified independent  (Device/Increase time)    Stand to Sit 6: Modified independent (Device/Increase time)      Ambulation/Gait   Ambulation/Gait Yes    Ambulation/Gait Assistance 5: Supervision    Ambulation Distance (Feet) --   around clinic with session   Assistive device None    Gait Pattern Step-through pattern;Decreased stride length;Decreased arm swing - right;Decreased arm swing - left;Trunk flexed;Wide base of support    Ambulation Surface Level;Indoor      Self-Care   Self-Care Other Self-Care Comments    Other Self-Care Comments  reviewed HEP with pt. still challenging since updates last week      Knee/Hip Exercises: Aerobic   Other Aerobic Scifit LE's only level 4.0 x 8 minutes with goal >/= 60 steps per minute for strengthening and activitiy tolerance.                   PT Short Term Goals - 02/18/21 1128       PT SHORT TERM GOAL #1   Title Pt will  be IND w/ initial HEP in order to indicate improved functional mobility and dec fall risk.  (Target Date: 02/13/21)    Baseline 02/18/21: met with current program    Status Achieved    Target Date 02/13/21      PT SHORT TERM GOAL #2   Title Pt will complete BERG balance and improve score by 6 points from baseline in order to indicate dec fall risk.    Baseline 02/18/21: 46/56, 4 point improvement from baseline score of 42/56    Status Partially Met      PT SHORT TERM GOAL #3   Title Pt will improve gait speed to >/=1.92 ft/sec with device (if needed) in order to indicate dec fall risk.    Baseline 02/18/21: 2.57 ft/sec with no AD    Status Achieved  PT SHORT TERM GOAL #4   Title Willl assess appropriateness for use of AD with gait to determine if safe for use at home/in community.    Baseline 02/18/21: RW has been trialed with decreased safety noted    Status Achieved      PT SHORT TERM GOAL #5   Title Will perform TUG and write appropriate LTG to reflect dec fall risk.    Baseline 01/22/21: 11.47 sec's no AD Normal TUG, PT to  set goal    Status Partially Met               PT Long Term Goals - 03/11/21 1626       PT LONG TERM GOAL #1   Title Pt will be IND with final HEP in order to indicate improved functional mobility and dec fall risk. (Target Date: 03/15/21)    Baseline 03/11/21: met with current program, will benefit from advancment as pt progresses    Status Achieved      PT LONG TERM GOAL #2   Title Pt will improve BERG balance score by 10 points from baseline in order to indicate dec fall risk.    Baseline 03/11/21: 50/56 scored today, improved from 42/56 just not to goal    Status Achieved      PT LONG TERM GOAL #3   Title Pt will ambulate with gait speed >/=2.5 ft/sec with device (if needed) in order to indicate dec fall risk.    Time 8    Period Weeks    Status On-going      PT LONG TERM GOAL #4   Title Pt will ambulate 300' w/ LRAD over unlevel paved surfaces (with device if needed) at S level in order to indicate improved community safety.    Time 8    Period Weeks    Status On-going                   Plan - 03/11/21 0855     Clinical Impression Statement Today's skilled session began to address progress toward LTGs for anticipated recert. Pt did improve Berg Balance test score to 50/56. Will plan to address remaining goals at next session. The pt is making progress and should benefit from continued PT to progress toward unmet goals.    Personal Factors and Comorbidities Comorbidity 3+;Time since onset of injury/illness/exacerbation    Comorbidities see above    Examination-Activity Limitations Dressing;Locomotion Level;Squat;Stairs;Stand;Lift;Transfers    Examination-Participation Restrictions Cleaning;Community Activity;Driving;Laundry;Shop;Meal Prep    Stability/Clinical Decision Making Evolving/Moderate complexity    Rehab Potential Good    PT Frequency 2x / week    PT Duration 8 weeks    PT Treatment/Interventions ADLs/Self Care Home Management;Aquatic Therapy;DME  Instruction;Gait training;Stair training;Functional mobility training;Therapeutic activities;Therapeutic exercise;Balance training;Neuromuscular re-education;Patient/family education;Orthotic Fit/Training;Energy conservation;Vestibular    PT Next Visit Plan check remaining goals for recert (primary PT plans to recert); continue to work on strengthening, balance training, gait outdoors and stepping strategies    PT Home Exercise Plan Access Code: [email or web address]    Consulted and Agree with Plan of Care Patient             Patient will benefit from skilled therapeutic intervention in order to improve the following deficits and impairments:  Abnormal gait, Cardiopulmonary status limiting activity, Decreased activity tolerance, Decreased balance, Decreased coordination, Decreased endurance, Decreased knowledge of use of DME, Decreased mobility, Decreased strength, Difficulty walking, Impaired perceived functional ability, Impaired sensation, Postural dysfunction  Visit Diagnosis: Unsteadiness on feet  Other abnormalities of gait and mobility  Muscle weakness (generalized)  Other disturbances of skin sensation     Problem List Patient Active Problem List   Diagnosis Date Noted   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[place]) 10/30/2020   Encounter for immunotherapy 10/30/2020   History of optic neuritis 10/30/2020   Urinary retention 07/17/2020   Encounter for monitoring immunomodulating therapy 05/02/2020   Numbness 08/29/2019   Gait disturbance 08/29/2019   Abnormal brain MRI 08/29/2019   High risk medication use 08/29/2019   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   Adrenal insufficiency (Addison's disease) ([place]) 08/11/2019   S/P left TKA 08/25/2016   S/P total knee replacement 08/25/2016   S/P shoulder replacement 06/29/2014   OSA (obstructive sleep apnea) 01/22/2014   Left ureteral stone 12/18/2013   UTI (urinary tract infection) 12/18/2013   Morbid obesity ([place]) 08/09/2013   S/P right TKA 08/08/2013    Chronic pain syndrome 03/04/2013   Depressive disorder, not elsewhere classified 03/04/2013   Need for prophylactic vaccination and inoculation against influenza 10/04/2012   Erosive osteoarthritis of multiple sites 10/04/2012   Chronic cough 10/04/2012   Swelling of limb 08/21/2012   Essential hypertension, benign 08/21/2012   Preoperative general physical examination 06/12/2012   Restless leg syndrome 06/12/2012   Anxiety state, unspecified 06/12/2012   Lung nodule 07/16/2011   Cough 04/21/2011   Wolff-Parkinson-White (WPW) syndrome    Osteoarthritis    Hyperlipidemia    IBS (irritable bowel syndrome)    Kidney stones    Willow Ora, PTA, [facility] [address], [place] [address], [place] [address] [phone] 03/11/21, 4:34 PM   Name: Connie Haynes MRN: [id number] Date of Birth: 04/06/1955

## 2021-03-13 ENCOUNTER — Encounter: Payer: Self-pay | Admitting: Physical Therapy

## 2021-03-13 ENCOUNTER — Ambulatory Visit: Payer: Medicare Other | Attending: Family Medicine | Admitting: Physical Therapy

## 2021-03-13 ENCOUNTER — Other Ambulatory Visit: Payer: Self-pay

## 2021-03-13 DIAGNOSIS — R2681 Unsteadiness on feet: Secondary | ICD-10-CM | POA: Insufficient documentation

## 2021-03-13 DIAGNOSIS — M542 Cervicalgia: Secondary | ICD-10-CM | POA: Diagnosis present

## 2021-03-13 DIAGNOSIS — R293 Abnormal posture: Secondary | ICD-10-CM | POA: Diagnosis present

## 2021-03-13 DIAGNOSIS — R208 Other disturbances of skin sensation: Secondary | ICD-10-CM | POA: Diagnosis present

## 2021-03-13 DIAGNOSIS — M6281 Muscle weakness (generalized): Secondary | ICD-10-CM | POA: Diagnosis present

## 2021-03-13 DIAGNOSIS — R2689 Other abnormalities of gait and mobility: Secondary | ICD-10-CM | POA: Insufficient documentation

## 2021-03-13 DIAGNOSIS — R296 Repeated falls: Secondary | ICD-10-CM | POA: Diagnosis present

## 2021-03-13 NOTE — Therapy (Signed)
[facility] [address] [place], [place], [address] Phone: [phone]   Fax:  [phone]  Physical Therapy Treatment  Patient Details  Name: Connie Haynes MRN: [id number] Date of Birth: 1955/06/27 Referring Provider (PT): Arlice Colt, MD   Encounter Date: 03/13/2021   PT End of Session - 03/13/21 0852     Visit Number 12    Number of Visits 17    Date for PT Re-Evaluation 03/15/21    Authorization Type [employer] Medicare (10th visit PN needed)    Progress Note Due on Visit 20    PT Start Time 0848    PT Stop Time 0930    PT Time Calculation (min) 42 min    Equipment Utilized During Treatment Gait belt    Activity Tolerance Patient tolerated treatment well    Behavior During Therapy WFL for tasks assessed/performed             Past Medical History:  Diagnosis Date   Adrenal insufficiency ([place])    Anemia    denies   Anxiety    Basal cell carcinoma    eye lid   Chronic kidney disease, stage 3 (HCC)    Cough    hx. dry cough chronic   Disorder of tear duct system    frequent tearing of eyes   GERD (gastroesophageal reflux disease)    H/O: whooping cough 2000   History of kidney infection 2015   ? sepsis   History of kidney stones    History of kidney stones    Hyperlipidemia    Hypertension    IBS (irritable bowel syndrome)    controlled with med   Osteoarthritis    arthritis -joints-limited ROM shoulders more on right   PONV (postoperative nausea and vomiting)     per pt hx of low BP following shoulder surgery in 05-2015; was managed in hospital with IV fluids and d/c BP being taken a tthe time; pt was dced and F/U with endcrinoopgist who dx with adrenal insurffiency and started steroid therapy; treatmetnt with positive outome, now managed chronically on hydrocortisone tablets;   Sleep apnea    no longer using cpap   Stroke [facility])    per patient "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 Hulen Skains)   [facility]     '93 for WPW syndrome   CARPAL TUNNEL RELEASE Right 12/21/2014   Procedure: RIGHT CARPAL TUNNEL RELEASE ;  Surgeon: Netta Cedars, MD;  Location: [facility];  Service: Orthopedics;  Laterality: Right;   CARPAL TUNNEL RELEASE Left    CERVICAL SPINE SURGERY     cervical fusion with hardware retained   COLON SURGERY     "colon detached from abdomen"   COLONOSCOPY WITH PROPOFOL N/A 05/10/2014   Procedure: COLONOSCOPY WITH PROPOFOL;  Surgeon: Juanita Craver, MD;  Location: [facility];  Service: Endoscopy;  Laterality: N/A;   CYSTOSCOPY/RETROGRADE/URETEROSCOPY Left 12/18/2013   Procedure: CYSTOSCOPY/RETROGRADE/LEFT STENT;  Surgeon: Malka So, MD;  Location: [facility];  Service: Urology;  Laterality: Left;   ESOPHAGOGASTRODUODENOSCOPY (EGD) WITH PROPOFOL N/A 05/10/2014   Procedure: ESOPHAGOGASTRODUODENOSCOPY (EGD) WITH PROPOFOL;  Surgeon: Juanita Craver, MD;  [facility]: [facility];  Service: Endoscopy;  Laterality: N/A;   EYELID CARCINOMA EXCISION     INSERTION OF DIALYSIS CATHETER N/A 08/16/2019   Procedure: INSERTION OF DIALYSIS CATHETER;  Surgeon: Garner Nash, DO;  Location: [facility];  Service: Pulmonary;  Laterality: N/A;   JOINT REPLACEMENT     KNEE ARTHROSCOPY Left 07/16/2016   REVERSE SHOULDER ARTHROPLASTY Right 06/29/2014   Procedure: REVERSE TOTAL SHOULDER ARTHROPLASTY;  Surgeon: Netta Cedars, MD;  Location: [facility];  Service: Orthopedics;  Laterality: Right;   REVISION TOTAL SHOULDER TO REVERSE TOTAL SHOULDER Left 05/15/2015    REVISION TOTAL SHOULDER TO REVERSE TOTAL SHOULDER Left 05/15/2015   Procedure: LEFT SHOULDER REVISION TOTAL SHOULDER ARTHRPLASTY TO REVERSE TOTAL SHOULDER ARTHROPLASTY ;  Surgeon: Netta Cedars, MD;  Location: [facility];  Service: Orthopedics;  Laterality: Left;   tear duct surgery     TOTAL ABDOMINAL HYSTERECTOMY     TOTAL HIP ARTHROPLASTY     x 3(rt x2, lt x1)   TOTAL KNEE ARTHROPLASTY Right 08/08/2013   Procedure: RIGHT TOTAL KNEE ARTHROPLASTY WITH SAPHENOUS NERVE SCAR EXCISION;  Surgeon: Mauri Pole, MD;  Location[facility];  Service: Orthopedics;  Laterality: Right;   TOTAL KNEE ARTHROPLASTY Left 08/25/2016   Procedure: LEFT TOTAL KNEE ARTHROPLASTY;  Surgeon: Paralee Cancel, MD;  Location: [facility];  Service: Orthopedics;  Laterality: Left;  70 mins; Adductor block   TOTAL SHOULDER ARTHROPLASTY Left 07/15/2012   Procedure: LEFT TOTAL SHOULDER ARTHROPLASTY;  Surgeon: Augustin Schooling, MD;  Location: [facility];  Service: Orthopedics;  Laterality: Left;   TOTAL SHOULDER REVISION Right 12/21/2014   Procedure: RIGHT SHOULDER POLY EXCHANGE ;  Surgeon: Netta Cedars, MD;  Location: [facility];  Service: Orthopedics;  Laterality: Right;   URETEROSCOPY  stent placement    There were no vitals filed for this visit.   Subjective Assessment - 03/13/21 0850     Subjective No new complaints No falls or pain to report. Stil with left upper trap/neck pain/soreness, better today than it was on Tuesday.    Pertinent History Adrenal insufficiency, HTN, wearing 2.5L O2 only at night.    Limitations Walking;Standing;House hold activities    Patient Stated Goals To work on my balance and walking    Currently in Pain? Yes    Pain Score 2     Pain Location Neck    Pain Orientation Left    Pain Descriptors / Indicators Sore;Tightness    Pain Type Acute pain    Pain Onset 1 to 4 weeks ago    Pain Frequency Constant    Aggravating Factors  positioning, muscle tensing    Pain Relieving Factors rest, heat                       [facility] Adult PT Treatment/Exercise - 03/13/21 0852       Transfers   Transfers Sit to Stand;Stand to Sit    Sit to Stand 6: Modified independent (Device/Increase time)    Stand to Sit 6: Modified independent (Device/Increase time)      Ambulation/Gait   Ambulation/Gait Yes    Ambulation/Gait Assistance 5: Supervision    Ambulation/Gait Assistance Details gait indoors working on increased distance with no balance loss noted. HR 113 bpm after long distance gait with mild shortness of breath.  Ambulation Distance (Feet) 420 Feet   x1, plus around clinic with session   Assistive device None    Gait Pattern Step-through pattern;Decreased stride length;Decreased arm swing - right;Decreased arm swing - left;Trunk flexed;Wide base of support    Ambulation Surface Level;Indoor    Gait velocity 12.22 sec's= 2.68 ft/sec      Knee/Hip Exercises: Aerobic   Other Aerobic Scifit LE's only level 4.0 x 8 minutes with goal >/= 60 steps per minute for strengthening and activitiy tolerance.                 Balance Exercises - 03/13/21 0911       Balance Exercises: Standing   Standing Eyes Closed Foam/compliant surface;Wide (BOA);3 reps;20 secs;Limitations    Standing Eyes Closed Limitations on airex with light UE support, feet hip width apart: EC 20 seconds x 3 reps with min assist, increased postural sway with posterior bias (pt with exaggerated upper trunk/arm movements with minimal to no movement at hips/pelvis)    Wall Bumps Hip;Eyes opened;15 reps;Limitations    Wall Bumps Limitations on solid floor with feet wide apart- with pt away from wall attempted to have pt take hips to wall, pt unable to shift far enough out of base of support to wall despite cues/assist. then had pt start with hips on wall, walk feet forward and then bring hips forward off wall. after a few reps worked to incorporate UE movements as well.                  PT Short Term Goals -  02/18/21 1128       PT SHORT TERM GOAL #1   Title Pt will  be IND w/ initial HEP in order to indicate improved functional mobility and dec fall risk.  (Target Date: 02/13/21)    Baseline 02/18/21: met with current program    Status Achieved    Target Date 02/13/21      PT SHORT TERM GOAL #2   Title Pt will complete BERG balance and improve score by 6 points from baseline in order to indicate dec fall risk.    Baseline 02/18/21: 46/56, 4 point improvement from baseline score of 42/56    Status Partially Met      PT SHORT TERM GOAL #3   Title Pt will improve gait speed to >/=1.92 ft/sec with device (if needed) in order to indicate dec fall risk.    Baseline 02/18/21: 2.57 ft/sec with no AD    Status Achieved      PT SHORT TERM GOAL #4   Title Willl assess appropriateness for use of AD with gait to determine if safe for use at home/in community.    Baseline 02/18/21: RW has been trialed with decreased safety noted    Status Achieved      PT SHORT TERM GOAL #5   Title Will perform TUG and write appropriate LTG to reflect dec fall risk.    Baseline 01/22/21: 11.47 sec's no AD Normal TUG, PT to set goal    Status Partially Met               PT Long Term Goals - 03/13/21 1540       PT LONG TERM GOAL #1   Title Pt will be IND with final HEP in order to indicate improved functional mobility and dec fall risk. (Target Date: 03/15/21)    Baseline 03/11/21: met with current program, will benefit from advancment as pt progresses  Status Achieved      PT LONG TERM GOAL #2   Title Pt will improve BERG balance score by 10 points from baseline in order to indicate dec fall risk.    Baseline 03/11/21: 50/56 scored today, improved from 42/56 just not to goal    Status Achieved      PT LONG TERM GOAL #3   Title Pt will ambulate with gait speed >/=2.5 ft/sec with device (if needed) in order to indicate dec fall risk.    Baseline 03/13/21: 2.22 ft/sec no AD, improved just not to goal    Time --     Period --    Status Achieved      PT LONG TERM GOAL #4   Title Pt will ambulate 300' w/ LRAD over unlevel paved surfaces (with device if needed) at S level in order to indicate improved community safety.    Baseline 03/13/21: unable to assess due to weather    Time --    Period --    Status Unable to assess                   Plan - 03/13/21 2613     Clinical Impression Statement Today's skilled session continued to focus on progress toward LTGs for recert with improvement in gait speed noted. Unable to check outdoor gait goal due to weather conditions this date. Remainder of session continued to focus on strengthening and balance training. No issues noted or reported in session. The pt is making progress and should benefit from continued PT to progress toward unmet goals.    Personal Factors and Comorbidities Comorbidity 3+;Time since onset of injury/illness/exacerbation    Comorbidities see above    Examination-Activity Limitations Dressing;Locomotion Level;Squat;Stairs;Stand;Lift;Transfers    Examination-Participation Restrictions Cleaning;Community Activity;Driving;Laundry;Shop;Meal Prep    Stability/Clinical Decision Making Evolving/Moderate complexity    Rehab Potential Good    PT Frequency 2x / week    PT Duration 8 weeks    PT Treatment/Interventions ADLs/Self Care Home Management;Aquatic Therapy;DME Instruction;Gait training;Stair training;Functional mobility training;Therapeutic activities;Therapeutic exercise;Balance training;Neuromuscular re-education;Patient/family education;Orthotic Fit/Training;Energy conservation;Vestibular    PT Next Visit Plan continue to work on strengthening, balance training, gait outdoors and stepping strategies    PT Home Exercise Plan Access Code: [email or web address]    Consulted and Agree with Plan of Care Patient             Patient will benefit from skilled therapeutic intervention in order to improve the following deficits and impairments:   Abnormal gait, Cardiopulmonary status limiting activity, Decreased activity tolerance, Decreased balance, Decreased coordination, Decreased endurance, Decreased knowledge of use of DME, Decreased mobility, Decreased strength, Difficulty walking, Impaired perceived functional ability, Impaired sensation, Postural dysfunction  Visit Diagnosis: Unsteadiness on feet  Other abnormalities of gait and mobility  Muscle weakness (generalized)  Repeated falls  Other disturbances of skin sensation     Problem List Patient Active Problem List   Diagnosis Date Noted   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HCC) 10/30/2020   Encounter for immunotherapy 10/30/2020   History of optic neuritis 10/30/2020   Urinary retention 07/17/2020   Encounter for monitoring immunomodulating therapy 05/02/2020   Numbness 08/29/2019   Gait disturbance 08/29/2019   Abnormal brain MRI 08/29/2019   High risk medication use 08/29/2019   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HCC) 08/11/2019   Adrenal insufficiency (Addison's disease) (HCC) 08/11/2019   S/P left TKA 08/25/2016   S/P total knee replacement 08/25/2016   S/P shoulder replacement 06/29/2014   OSA (obstructive sleep apnea) 01/22/2014  Left ureteral stone 12/18/2013   UTI (urinary tract infection) 12/18/2013   Morbid obesity ([place]) 08/09/2013   S/P right TKA 08/08/2013   Chronic pain syndrome 03/04/2013   Depressive disorder, not elsewhere classified 03/04/2013   Need for prophylactic vaccination and inoculation against influenza 10/04/2012   Erosive osteoarthritis of multiple sites 10/04/2012   Chronic cough 10/04/2012   Swelling of limb 08/21/2012   Essential hypertension, benign 08/21/2012   Preoperative general physical examination 06/12/2012   Restless leg syndrome 06/12/2012   Anxiety state, unspecified 06/12/2012   Lung nodule 07/16/2011   Cough 04/21/2011   Wolff-Parkinson-White (WPW) syndrome    Osteoarthritis    Hyperlipidemia    IBS (irritable bowel syndrome)     Kidney stones     Willow Ora, PTA, [facility] [address] [place], [place] [address] [phone] 03/13/21, 10:05 AM   Name: Connie Haynes MRN: [id number] Date of Birth: 12/04/1955

## 2021-03-18 ENCOUNTER — Ambulatory Visit: Payer: Medicare Other | Admitting: Rehabilitation

## 2021-03-18 ENCOUNTER — Other Ambulatory Visit: Payer: Self-pay

## 2021-03-18 ENCOUNTER — Encounter: Payer: Self-pay | Admitting: Rehabilitation

## 2021-03-18 DIAGNOSIS — R2681 Unsteadiness on feet: Secondary | ICD-10-CM

## 2021-03-18 DIAGNOSIS — R296 Repeated falls: Secondary | ICD-10-CM

## 2021-03-18 DIAGNOSIS — R2689 Other abnormalities of gait and mobility: Secondary | ICD-10-CM

## 2021-03-18 DIAGNOSIS — M542 Cervicalgia: Secondary | ICD-10-CM

## 2021-03-18 DIAGNOSIS — M6281 Muscle weakness (generalized): Secondary | ICD-10-CM

## 2021-03-18 DIAGNOSIS — R293 Abnormal posture: Secondary | ICD-10-CM

## 2021-03-18 DIAGNOSIS — R208 Other disturbances of skin sensation: Secondary | ICD-10-CM

## 2021-03-18 NOTE — Therapy (Signed)
[facility] [address] [place], [place], [address] Phone: [phone]   Fax:  [phone]  Physical Therapy Treatment and Recert   Patient Details  Name: Connie Haynes MRN: [id number] Date of Birth: [birth year] Referring Provider (PT): Arlice Colt, MD   Encounter Date: 03/18/2021   PT End of Session - 03/18/21 0940     Visit Number 13    Number of Visits 33   Per updated POC   Date for PT Re-Evaluation 05/17/21   per updated [place] [employer] (10th visit PN needed)    Progress Note Due on Visit 20    PT Start Time 0843    PT Stop Time 0928    PT Time Calculation (min) 45 min    Equipment Utilized During Treatment Gait belt    Activity Tolerance Patient tolerated treatment well    Behavior During Therapy WFL for tasks assessed/performed             Past Medical History:  Diagnosis Date   Adrenal insufficiency ([facility])    Anemia    denies   Anxiety    Basal cell carcinoma    eye lid   Chronic kidney disease, stage 3 (HCC)    Cough    hx. dry cough chronic   Disorder of tear duct system    frequent tearing of eyes   GERD (gastroesophageal reflux disease)    H/O: whooping cough 2000   History of kidney infection 2015   ? sepsis   History of kidney stones    History of kidney stones    Hyperlipidemia    Hypertension    IBS (irritable bowel syndrome)    controlled with med   Osteoarthritis    arthritis -joints-limited ROM shoulders more on right   PONV (postoperative nausea and vomiting)     per pt hx of low BP following shoulder surgery in 05-2015; was managed in hospital with IV fluids and d/c BP being taken a tthe time; pt was dced and F/U with endcrinoopgist who dx with adrenal insurffiency and started steroid therapy; treatmetnt with positive outome, now managed chronically on hydrocortisone tablets;   Sleep apnea    no longer using cpap   Stroke [facility])    per patient "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 Hulen Skains)   [facility]     '93 for WPW syndrome   CARPAL TUNNEL RELEASE Right 12/21/2014   Procedure: RIGHT CARPAL TUNNEL RELEASE ;  Surgeon: Netta Cedars, MD;  Location: [facility];  Service: Orthopedics;  Laterality: Right;   CARPAL TUNNEL RELEASE Left    CERVICAL SPINE SURGERY     cervical fusion with hardware retained   COLON SURGERY     "colon detached from abdomen"   COLONOSCOPY WITH PROPOFOL N/A 05/10/2014   Procedure: COLONOSCOPY WITH PROPOFOL;  Surgeon: Juanita Craver, MD;  Location: [facility];  Service: Endoscopy;  Laterality: N/A;   CYSTOSCOPY/RETROGRADE/URETEROSCOPY Left 12/18/2013   Procedure:  CYSTOSCOPY/RETROGRADE/LEFT STENT;  Surgeon: Malka So, MD;  Location: [facility];  Service: Urology;  Laterality: Left;   ESOPHAGOGASTRODUODENOSCOPY (EGD) WITH PROPOFOL N/A 05/10/2014   Procedure: ESOPHAGOGASTRODUODENOSCOPY (EGD) WITH PROPOFOL;  Surgeon: Juanita Craver, MD;  [facility];  Service: Endoscopy;  Laterality: N/A;   EYELID CARCINOMA EXCISION     INSERTION OF DIALYSIS CATHETER N/A 08/16/2019   Procedure: INSERTION OF DIALYSIS CATHETER;  Surgeon: Garner Nash, DO;  Location: [facility];  Service: Pulmonary;  Laterality: N/A;   JOINT REPLACEMENT     KNEE ARTHROSCOPY Left 07/16/2016   REVERSE SHOULDER ARTHROPLASTY Right 06/29/2014   Procedure: REVERSE TOTAL SHOULDER ARTHROPLASTY;  Surgeon: Netta Cedars, MD;  Location: [facility];  Service: Orthopedics;  Laterality: Right;   REVISION TOTAL  SHOULDER TO REVERSE TOTAL SHOULDER Left 05/15/2015   REVISION TOTAL SHOULDER TO REVERSE TOTAL SHOULDER Left 05/15/2015   Procedure: LEFT SHOULDER REVISION TOTAL SHOULDER ARTHRPLASTY TO REVERSE TOTAL SHOULDER ARTHROPLASTY ;  Surgeon: Netta Cedars, MD;  Location: [facility];  Service: Orthopedics;  Laterality: Left;   tear duct surgery     TOTAL ABDOMINAL HYSTERECTOMY     TOTAL HIP ARTHROPLASTY     x 3(rt x2, lt x1)   TOTAL KNEE ARTHROPLASTY Right 08/08/2013   Procedure: RIGHT TOTAL KNEE ARTHROPLASTY WITH SAPHENOUS NERVE SCAR EXCISION;  Surgeon: Mauri Pole, MD;  [facility];  Service: Orthopedics;  Laterality: Right;   TOTAL KNEE ARTHROPLASTY Left 08/25/2016   Procedure: LEFT TOTAL KNEE ARTHROPLASTY;  Surgeon: Paralee Cancel, MD;  [facility];  Service: Orthopedics;  Laterality: Left;  70 mins; Adductor block   TOTAL SHOULDER ARTHROPLASTY Left 07/15/2012   Procedure: LEFT TOTAL SHOULDER ARTHROPLASTY;  Surgeon: Augustin Schooling, MD;  Location: [facility];  Service: Orthopedics;  Laterality: Left;   TOTAL SHOULDER REVISION Right 12/21/2014   Procedure: RIGHT SHOULDER POLY EXCHANGE ;  Surgeon: Netta Cedars, MD;  Location: [facility];  Service: Orthopedics;  Laterality: Right;   URETEROSCOPY  stent placement    There were no vitals filed for this visit.   Subjective Assessment - 03/18/21 0846     Subjective Pain in neck is still there, is willing to try dry needling.    Pertinent History Adrenal insufficiency, HTN, wearing 2.5L O2 only at night.    Limitations Walking;Standing;House hold activities    Patient Stated Goals To work on my balance and walking    Currently in Pain? Yes    Pain Score 6     Pain Location Neck    Pain Orientation Left    Pain Descriptors / Indicators Tightness;Sore    Pain Type Acute pain    Pain Onset 1 to 4 weeks ago    Pain Frequency Constant    Aggravating Factors  positioning, muscle tensing    Pain Relieving Factors rest, heat                                [facility] Adult PT Treatment/Exercise - 03/18/21 0850       Transfers   Transfers Sit to Stand;Stand to Sit    Sit to Stand 6: Modified independent (Device/Increase time)    Stand to Sit 6: Modified independent (Device/Increase time)      Ambulation/Gait   Ambulation/Gait Yes    Ambulation/Gait Assistance 6: Modified independent (Device/Increase time);5: Supervision;4: Min guard;4: Min assist    Ambulation/Gait Assistance Details Pt mod I into clinic but requires S  to min/guard when ambulating over unlevel paved outdoor surfaces and min A for grassy surfaces due to poor sensation and poor balance reactions. Pt is motivated to be able to ambulate around this building over paved surfaces, therefore will add this to goals as well as short distances in grass.  Note that she does continue to look down during all gait, which is likely not helping her neck pain, will begin to address during all aspects of mobility.    Ambulation Distance (Feet) 300 Feet    Assistive device None    Gait Pattern Step-through pattern;Decreased stride length;Decreased arm swing - right;Decreased arm swing - left;Trunk flexed;Wide base of support    Ambulation Surface Level;Unlevel;Indoor;Outdoor;Paved;Grass    Gait Comments Pyt with difficulty going up/down small ramp surfaces outdoors as well, but no overt LOB, just wide BOS.      Exercises   Other Exercises  Seated R lateral flexion to stretch L upper trap x 2 sets of 30 secs with cues for very gentle stretch.  Moved into L levator stretch which was also very difficult and caused more pain, but cued to perform less ROM to get more tolerable stretch.      Manual Therapy   Manual Therapy Soft tissue mobilization;Scapular mobilization    Manual therapy comments Seated and R SL to reduce pain and improve flexibility    Soft tissue mobilization VERY light STM to L upper trap, levator and cervical paraspinals.  Pt very limited due to  amount of pain/tenderness.  Pt willing to let PT continue to try STM. Performed both in sitting and R SL in order to see if SL would allow her to relax posture more.    Scapular Mobilization In R SL to allow muscles to relax.  PT performing gentle scapular mobilization while pt performed scapular depression x 10 reps with 5 sec hold                       PT Short Term Goals - 03/18/21 0958       PT SHORT TERM GOAL #1   Title Pt will  be IND w/ initial HEP in order to indicate improved functional mobility and dec fall risk.  (Target Date:04/17/21 )    Time 4    Period Weeks    Status On-going    Target Date 04/17/21      PT SHORT TERM GOAL #2   Title Pt will score >/=53/56 on BERG balance test in order to indicate dec fall risk.    Baseline 50/56    Time 4    Period Weeks    Status Revised      PT SHORT TERM GOAL #3   Title Pt will improve gait speed to >/=2.62 ft/sec with device (if needed) in order to indicate dec fall risk.    Baseline 2.22 ft/sec    Time 4    Period Weeks    Status Revised      PT SHORT TERM GOAL #4   Title Will assess DGI in order to have a better assessment of dynamic balance and fall risk with functional mobility.    Time 4    Period Weeks    Status Revised      PT SHORT TERM GOAL #5   Title Pt will ambulate x 500' over unlevel paved outdoor surfaces at S level in order to be more independent in community.    Time 4    Period Weeks  Status New               PT Long Term Goals - 03/18/21 1194       PT LONG TERM GOAL #1   Title Pt will be IND with final HEP in order to indicate improved functional mobility and dec fall risk. (Target Date: 05/17/21)    Time 8    Period Weeks    Status On-going      PT LONG TERM GOAL #2   Title Pt will improve DGI by 3 points from baseline in order to indicate dec fall risk.    Time 8    Period Weeks    Status New      PT LONG TERM GOAL #3   Title Pt will ambulate with gait speed >/=3.0  ft/sec with device (if needed) in order to indicate dec fall risk.    Time 8    Period Weeks    Status Revised      PT LONG TERM GOAL #4   Title Pt will ambulate 1000' w/ LRAD over unlevel paved surfaces (with device if needed) at S level in order to indicate improved community safety.    Time 8    Period Weeks    Status Revised      PT LONG TERM GOAL #5   Title Pt will ambulate 25' over grassy surfaces at S level in order to indicate improved safety with outdoor/community tasks.    Time 8    Period Weeks    Status New      Additional Long Term Goals   Additional Long Term Goals Yes      PT LONG TERM GOAL #6   Title Pt will report no more than 2/10 pain in L upper trap/cervical region in order to indicate improved posture and flexibility with functional mobility.    Time 8    Period Weeks    Status New              PT Short Term Goals - 03/18/21 0958       PT SHORT TERM GOAL #1   Title Pt will  be IND w/ initial HEP in order to indicate improved functional mobility and dec fall risk.  (Target Date:04/17/21 )    Time 4    Period Weeks    Status On-going    Target Date 04/17/21      PT SHORT TERM GOAL #2   Title Pt will score >/=53/56 on BERG balance test in order to indicate dec fall risk.    Baseline 50/56    Time 4    Period Weeks    Status Revised      PT SHORT TERM GOAL #3   Title Pt will improve gait speed to >/=2.62 ft/sec with device (if needed) in order to indicate dec fall risk.    Baseline 2.22 ft/sec    Time 4    Period Weeks    Status Revised      PT SHORT TERM GOAL #4   Title Will assess DGI in order to have a better assessment of dynamic balance and fall risk with functional mobility.    Time 4    Period Weeks    Status Revised      PT SHORT TERM GOAL #5   Title Pt will ambulate x 500' over unlevel paved outdoor surfaces at S level in order to be more independent in community.    Time 4  Period Weeks    Status New             PT  Long Term Goals - 03/18/21 0630       PT LONG TERM GOAL #1   Title Pt will be IND with final HEP in order to indicate improved functional mobility and dec fall risk. (Target Date: 05/17/21)    Time 8    Period Weeks    Status On-going      PT LONG TERM GOAL #2   Title Pt will improve DGI by 3 points from baseline in order to indicate dec fall risk.    Time 8    Period Weeks    Status New      PT LONG TERM GOAL #3   Title Pt will ambulate with gait speed >/=3.0 ft/sec with device (if needed) in order to indicate dec fall risk.    Time 8    Period Weeks    Status Revised      PT LONG TERM GOAL #4   Title Pt will ambulate 1000' w/ LRAD over unlevel paved surfaces (with device if needed) at S level in order to indicate improved community safety.    Time 8    Period Weeks    Status Revised      PT LONG TERM GOAL #5   Title Pt will ambulate 25' over grassy surfaces at S level in order to indicate improved safety with outdoor/community tasks.    Time 8    Period Weeks    Status New      Additional Long Term Goals   Additional Long Term Goals Yes      PT LONG TERM GOAL #6   Title Pt will report no more than 2/10 pain in L upper trap/cervical region in order to indicate improved posture and flexibility with functional mobility.    Time 8    Period Weeks    Status New                   Plan - 03/18/21 0941     Clinical Impression Statement Skilled session focused on gentle stretching of lateral cervical musculature and light manual therapy to reduce pain and improve flexibility.  This pain is causing her to wake at night and often times limits her functional mobility and certainly her ability to scan environment, effecting her balance further.  She is open to getting dry needling, so I will add these things to POC today.  Also assessed outdoor gait over both unlevel paved and grassy surfaces.  She is close S to min/guard for paved surfaces and min A for grassy surfaces.   Continue to address hip strategy and would like to begin to incorporate stepping strategy.    Personal Factors and Comorbidities Comorbidity 3+;Time since onset of injury/illness/exacerbation    Comorbidities see above    Examination-Activity Limitations Dressing;Locomotion Level;Squat;Stairs;Stand;Lift;Transfers    Examination-Participation Restrictions Cleaning;Community Activity;Driving;Laundry;Shop;Meal Prep    Stability/Clinical Decision Making Evolving/Moderate complexity    Rehab Potential Good    PT Frequency 2x / week    PT Duration 8 weeks    PT Treatment/Interventions ADLs/Self Care Home Management;Aquatic Therapy;DME Instruction;Gait training;Stair training;Functional mobility training;Therapeutic activities;Therapeutic exercise;Balance training;Neuromuscular re-education;Patient/family education;Orthotic Fit/Training;Energy conservation;Vestibular;Moist Heat;Manual techniques;Passive range of motion;Taping;Dry needling    PT Next Visit Plan continue to work on strengthening, balance training, gait outdoors and stepping strategies (stepping to targets, coming close to and crossing midline), hip strategy    PT Home Exercise  Plan Access Code: [email or web address] (verbally added wall bumps, did not add picture, she was okay with that)    Consulted and Agree with Plan of Care Patient             Patient will benefit from skilled therapeutic intervention in order to improve the following deficits and impairments:  Abnormal gait, Cardiopulmonary status limiting activity, Decreased activity tolerance, Decreased balance, Decreased coordination, Decreased endurance, Decreased knowledge of use of DME, Decreased mobility, Decreased strength, Difficulty walking, Impaired perceived functional ability, Impaired sensation, Postural dysfunction, Impaired flexibility, Improper body mechanics  Visit Diagnosis: Unsteadiness on feet - Plan: PT plan of care cert/re-cert  Muscle weakness (generalized) - Plan: PT  plan of care cert/re-cert  Repeated falls - Plan: PT plan of care cert/re-cert  Other disturbances of skin sensation - Plan: PT plan of care cert/re-cert  Other abnormalities of gait and mobility - Plan: PT plan of care cert/re-cert  Cervicalgia - Plan: PT plan of care cert/re-cert  Abnormal posture - Plan: PT plan of care cert/re-cert     Problem List Patient Active Problem List   Diagnosis Date Noted   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[place]) 10/30/2020   Encounter for immunotherapy 10/30/2020   History of optic neuritis 10/30/2020   Urinary retention 07/17/2020   Encounter for monitoring immunomodulating therapy 05/02/2020   Numbness 08/29/2019   Gait disturbance 08/29/2019   Abnormal brain MRI 08/29/2019   High risk medication use 08/29/2019   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   Adrenal insufficiency (Addison's disease) ([place]) 08/11/2019   S/P left TKA 08/25/2016   S/P total knee replacement 08/25/2016   S/P shoulder replacement 06/29/2014   OSA (obstructive sleep apnea) 01/22/2014   Left ureteral stone 12/18/2013   UTI (urinary tract infection) 12/18/2013   Morbid obesity ([place]) 08/09/2013   S/P right TKA 08/08/2013   Chronic pain syndrome 03/04/2013   Depressive disorder, not elsewhere classified 03/04/2013   Need for prophylactic vaccination and inoculation against influenza 10/04/2012   Erosive osteoarthritis of multiple sites 10/04/2012   Chronic cough 10/04/2012   Swelling of limb 08/21/2012   Essential hypertension, benign 08/21/2012   Preoperative general physical examination 06/12/2012   Restless leg syndrome 06/12/2012   Anxiety state, unspecified 06/12/2012   Lung nodule 07/16/2011   Cough 04/21/2011   Wolff-Parkinson-White (WPW) syndrome    Osteoarthritis    Hyperlipidemia    IBS (irritable bowel syndrome)    Kidney stones     Cameron Sprang, PT, MPT [facility] [address] [place], [place], [address] Phone:  [phone]   Fax:  [phone] 03/18/21, 10:04 AM   Name: Connie Haynes MRN: [id number] Date of Birth: [birth year]

## 2021-03-20 ENCOUNTER — Other Ambulatory Visit: Payer: Self-pay

## 2021-03-20 ENCOUNTER — Encounter: Payer: Self-pay | Admitting: Physical Therapy

## 2021-03-20 ENCOUNTER — Ambulatory Visit: Payer: Medicare Other | Admitting: Physical Therapy

## 2021-03-20 DIAGNOSIS — M6281 Muscle weakness (generalized): Secondary | ICD-10-CM

## 2021-03-20 DIAGNOSIS — R296 Repeated falls: Secondary | ICD-10-CM

## 2021-03-20 DIAGNOSIS — R2681 Unsteadiness on feet: Secondary | ICD-10-CM

## 2021-03-20 NOTE — Therapy (Addendum)
[facility] [address] [place], [place], [address] Phone: [phone]   Fax:  [phone]  Physical Therapy Treatment  Patient Details  Name: Connie Haynes MRN: [id number] Date of Birth: 1955/06/01 Referring Provider (PT): Arlice Colt, MD   Encounter Date: 03/20/2021   PT End of Session - 03/20/21 0852     Visit Number 14    Number of Visits 33   Per updated POC   Date for PT Re-Evaluation 05/17/21   per updated [place] [employer] (10th visit PN needed)    Progress Note Due on Visit 20    PT Start Time 0847    PT Stop Time 0928    PT Time Calculation (min) 41 min    Equipment Utilized During Treatment Gait belt    Activity Tolerance Patient tolerated treatment well    Behavior During Therapy WFL for tasks assessed/performed             Past Medical History:  Diagnosis Date   Adrenal insufficiency ([place])    Anemia    denies   Anxiety    Basal cell carcinoma    eye lid   Chronic kidney disease, stage 3 (HCC)    Cough    hx. dry cough chronic   Disorder of tear duct system    frequent tearing of eyes   GERD (gastroesophageal reflux disease)    H/O: whooping cough 2000   History of kidney infection 2015   ? sepsis   History of kidney stones    History of kidney stones    Hyperlipidemia    Hypertension    IBS (irritable bowel syndrome)    controlled with med   Osteoarthritis    arthritis -joints-limited ROM shoulders more on right   PONV (postoperative nausea and vomiting)     per pt hx of low BP following shoulder surgery in 05-2015; was managed in hospital with IV fluids and d/c BP being taken a tthe time; pt was dced and F/U with endcrinoopgist who dx with adrenal insurffiency and started steroid therapy; treatmetnt with positive outome, now managed chronically on hydrocortisone tablets;   Sleep apnea    no longer using cpap   Stroke [facility])    per patient "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 Hulen Skains)   [place]     '93 for WPW syndrome   CARPAL TUNNEL RELEASE Right 12/21/2014   Procedure: RIGHT CARPAL TUNNEL RELEASE ;  Surgeon: Netta Cedars, MD;  Location: [facility];  Service: Orthopedics;  Laterality: Right;   CARPAL TUNNEL RELEASE Left    CERVICAL SPINE SURGERY     cervical fusion with hardware retained   COLON SURGERY     "colon detached from abdomen"   COLONOSCOPY WITH PROPOFOL N/A 05/10/2014   Procedure: COLONOSCOPY WITH PROPOFOL;  Surgeon: Juanita Craver, MD;  Location: [facility];  Service: Endoscopy;  Laterality: N/A;   CYSTOSCOPY/RETROGRADE/URETEROSCOPY Left 12/18/2013   Procedure: CYSTOSCOPY/RETROGRADE/LEFT STENT;  Surgeon: Malka So, MD;  Location: [facility];  Service: Urology;  Laterality: Left;   ESOPHAGOGASTRODUODENOSCOPY (EGD) WITH PROPOFOL N/A 05/10/2014   Procedure: ESOPHAGOGASTRODUODENOSCOPY (EGD) WITH PROPOFOL;  Surgeon: Juanita Craver, MD;  [facility];  Service: Endoscopy;  Laterality: N/A;   EYELID CARCINOMA EXCISION     INSERTION OF DIALYSIS CATHETER N/A 08/16/2019   Procedure: INSERTION OF DIALYSIS CATHETER;  Surgeon: Garner Nash, DO;  Location: [facility];  Service: Pulmonary;  Laterality: N/A;   JOINT REPLACEMENT     KNEE ARTHROSCOPY Left 07/16/2016   REVERSE SHOULDER ARTHROPLASTY Right 06/29/2014   Procedure: REVERSE TOTAL SHOULDER ARTHROPLASTY;  Surgeon: Netta Cedars, MD;  Location: [facility];  Service: Orthopedics;  Laterality: Right;   REVISION TOTAL SHOULDER TO  REVERSE TOTAL SHOULDER Left 05/15/2015   REVISION TOTAL SHOULDER TO REVERSE TOTAL SHOULDER Left 05/15/2015   Procedure: LEFT SHOULDER REVISION TOTAL SHOULDER ARTHRPLASTY TO REVERSE TOTAL SHOULDER ARTHROPLASTY ;  Surgeon: Netta Cedars, MD;  Location: [facility];  Service: Orthopedics;  Laterality: Left;   tear duct surgery     TOTAL ABDOMINAL HYSTERECTOMY     TOTAL HIP ARTHROPLASTY     x 3(rt x2, lt x1)   TOTAL KNEE ARTHROPLASTY Right 08/08/2013   Procedure: RIGHT TOTAL KNEE ARTHROPLASTY WITH SAPHENOUS NERVE SCAR EXCISION;  Surgeon: Mauri Pole, MD;  Location: [facility];  Service: Orthopedics;  Laterality: Right;   TOTAL KNEE ARTHROPLASTY Left 08/25/2016   Procedure: LEFT TOTAL KNEE ARTHROPLASTY;  Surgeon: Paralee Cancel, MD;  Location[facility];  Service: Orthopedics;  Laterality: Left;  70 mins; Adductor block   TOTAL SHOULDER ARTHROPLASTY Left 07/15/2012   Procedure: LEFT TOTAL SHOULDER ARTHROPLASTY;  Surgeon: Augustin Schooling, MD;  Location: [facility];  Service: Orthopedics;  Laterality: Left;   TOTAL SHOULDER REVISION Right 12/21/2014   Procedure: RIGHT SHOULDER POLY EXCHANGE ;  Surgeon: Netta Cedars, MD;  Location: [facility];  Service: Orthopedics;  Laterality: Right;   URETEROSCOPY  stent placement    There were no vitals filed for this visit.   Subjective Assessment - 03/20/21 0850     Subjective Neck felt better after last session. L leg not feeling the best today. Had a fall yesterday at home when trying to do the wall bumps, missed the wall and fell straight down. Had to have her husband help get her up.    Pertinent History Adrenal insufficiency, HTN, wearing 2.5L O2 only at night.    Limitations Walking;Standing;House hold activities    Patient Stated Goals To work on my balance and walking    Currently in Pain? Yes    Pain Score 3     Pain Location Neck    Pain Orientation Left    Pain Descriptors / Indicators Tightness;Sore    Pain Type Acute pain    Pain Onset 1 to 4 weeks ago    Aggravating  Factors  Moving it    Pain Relieving Factors Roll-on Aleve                                    Balance Exercises - 03/20/21 0916       Balance Exercises: Standing   Standing Eyes Opened Wide (BOA);Foam/compliant surface;Limitations    Standing Eyes Opened Limitations On single pillow static stance 2 x30 seconds (exaggerated arm movements at times to help maintain balance). Cues for core activation:  alternating UE lifts x5 reps each side,  reaching latreally outside of BOS  to tap cone x5 reps each side and then reaching across body to tap target x5 reps each side. Cued for weight shifting first before reaching outside of BOS.    Standing Eyes Closed Narrow base of support (BOS);Solid surface    Standing Eyes Closed Limitations 2 reps of 30 seconds with postural sway    Wall Bumps Hip;Eyes opened;15 reps;Limitations    Wall Bumps Limitations Reviewed from pt's HEP (due to recent fall when pt performed at home). Discussed to start on the wall and then walk feet forwards slightly, from there then bring hips forward off the wall. Then gently hinge from hips and bring hips/bottom back to wall and raise trunk to tap wall. Pt with impaired sensation on buttocks and unable to tell when her bottom hits the wall. PT having to cue pt on when to lift her trunk up. Discussed for safety having her husband perform with her at home for safety so he can let her know when her buttocks touch the wall and making sure pt starts on the wall first and walks her feet forwards (at home pt stood away from wall and then just tried to lean her bottom back when she fell).    Stepping Strategy Anterior;UE support;Limitations    Stepping Strategy Limitations x8 reps each side - with single UE support at counter. Put colorful floor dot in front of pt as visual cue. Pt with incr fear initially when stepping, cued first to shift weight before stepping. Easier to perform stepping forwards with RLE. Took incr  time to perform with LLE and pt with incr difficulty stepping back to midline, did improve with incr reps, but heavy trunk lean to R to step it back.                PT Education - 03/20/21 0930     Education Details Reviewed wall bumps exercise for HEP and safety at home (performing with husband there for supervision)    Person(s) Educated Patient    Methods Explanation;Demonstration;Verbal cues    Comprehension Verbalized understanding;Returned demonstration              PT Short Term Goals - 03/18/21 0958       PT SHORT TERM GOAL #1   Title Pt will  be IND w/ initial HEP in order to indicate improved functional mobility and dec fall risk.  (Target Date:04/17/21 )    Time 4    Period Weeks    Status On-going    Target Date 04/17/21      PT SHORT TERM GOAL #2   Title Pt will score >/=53/56 on BERG balance test in order to indicate dec fall risk.    Baseline 50/56    Time 4    Period Weeks    Status Revised      PT SHORT TERM GOAL #3   Title Pt will improve gait speed to >/=2.62 ft/sec with device (if needed) in order to indicate dec fall risk.    Baseline 2.22 ft/sec    Time 4    Period Weeks    Status Revised      PT SHORT TERM GOAL #4   Title Will assess DGI in order to have a better assessment of dynamic balance and fall risk with functional mobility.    Time 4    Period Weeks    Status Revised      PT SHORT TERM GOAL #5  Title Pt will ambulate x 500' over unlevel paved outdoor surfaces at S level in order to be more independent in community.    Time 4    Period Weeks    Status New               PT Long Term Goals - 03/18/21 8099       PT LONG TERM GOAL #1   Title Pt will be IND with final HEP in order to indicate improved functional mobility and dec fall risk. (Target Date: 05/17/21)    Time 8    Period Weeks    Status On-going      PT LONG TERM GOAL #2   Title Pt will improve DGI by 3 points from baseline in order to indicate dec fall  risk.    Time 8    Period Weeks    Status New      PT LONG TERM GOAL #3   Title Pt will ambulate with gait speed >/=3.0 ft/sec with device (if needed) in order to indicate dec fall risk.    Time 8    Period Weeks    Status Revised      PT LONG TERM GOAL #4   Title Pt will ambulate 1000' w/ LRAD over unlevel paved surfaces (with device if needed) at S level in order to indicate improved community safety.    Time 8    Period Weeks    Status Revised      PT LONG TERM GOAL #5   Title Pt will ambulate 25' over grassy surfaces at S level in order to indicate improved safety with outdoor/community tasks.    Time 8    Period Weeks    Status New      Additional Long Term Goals   Additional Long Term Goals Yes      PT LONG TERM GOAL #6   Title Pt will report no more than 2/10 pain in L upper trap/cervical region in order to indicate improved posture and flexibility with functional mobility.    Time 8    Period Weeks    Status New                   Plan - 03/20/21 1305     Clinical Impression Statement Pt had a fall yesterday when performing wall bumps at home, went over how to safely perform at home and making sure that pt performs with husband supervision for proper technique as pt has impaired sensation in buttocks and unable to tell when she is at the wall initially. Worked on stepping strategies today in the forwards direction, pt with incr difficulty stepping back to midline with LLE. Will continue to progress towards LTGs.    Personal Factors and Comorbidities Comorbidity 3+;Time since onset of injury/illness/exacerbation    Comorbidities see above    Examination-Activity Limitations Dressing;Locomotion Level;Squat;Stairs;Stand;Lift;Transfers    Examination-Participation Restrictions Cleaning;Community Activity;Driving;Laundry;Shop;Meal Prep    Stability/Clinical Decision Making Evolving/Moderate complexity    Rehab Potential Good    PT Frequency 2x / week    PT  Duration 8 weeks    PT Treatment/Interventions ADLs/Self Care Home Management;Aquatic Therapy;DME Instruction;Gait training;Stair training;Functional mobility training;Therapeutic activities;Therapeutic exercise;Balance training;Neuromuscular re-education;Patient/family education;Orthotic Fit/Training;Energy conservation;Vestibular;Moist Heat;Manual techniques;Passive range of motion;Taping;Dry needling    PT Next Visit Plan how did wall bumps go at home? continue to work on strengthening, balance training, gait outdoors and stepping strategies (stepping to targets, coming close to and crossing midline), hip strategy  PT Home Exercise Plan Access Code: [email or web address] (verbally added wall bumps, did not add picture, she was okay with that)    Consulted and Agree with Plan of Care Patient             Patient will benefit from skilled therapeutic intervention in order to improve the following deficits and impairments:  Abnormal gait, Cardiopulmonary status limiting activity, Decreased activity tolerance, Decreased balance, Decreased coordination, Decreased endurance, Decreased knowledge of use of DME, Decreased mobility, Decreased strength, Difficulty walking, Impaired perceived functional ability, Impaired sensation, Postural dysfunction, Impaired flexibility, Improper body mechanics  Visit Diagnosis: Unsteadiness on feet  Muscle weakness (generalized)  Repeated falls     Problem List Patient Active Problem List   Diagnosis Date Noted   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[place]) 10/30/2020   Encounter for immunotherapy 10/30/2020   History of optic neuritis 10/30/2020   Urinary retention 07/17/2020   Encounter for monitoring immunomodulating therapy 05/02/2020   Numbness 08/29/2019   Gait disturbance 08/29/2019   Abnormal brain MRI 08/29/2019   High risk medication use 08/29/2019   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   Adrenal insufficiency (Addison's disease) ([place]) 08/11/2019   S/P left TKA 08/25/2016    S/P total knee replacement 08/25/2016   S/P shoulder replacement 06/29/2014   OSA (obstructive sleep apnea) 01/22/2014   Left ureteral stone 12/18/2013   UTI (urinary tract infection) 12/18/2013   Morbid obesity ([place]) 08/09/2013   S/P right TKA 08/08/2013   Chronic pain syndrome 03/04/2013   Depressive disorder, not elsewhere classified 03/04/2013   Need for prophylactic vaccination and inoculation against influenza 10/04/2012   Erosive osteoarthritis of multiple sites 10/04/2012   Chronic cough 10/04/2012   Swelling of limb 08/21/2012   Essential hypertension, benign 08/21/2012   Preoperative general physical examination 06/12/2012   Restless leg syndrome 06/12/2012   Anxiety state, unspecified 06/12/2012   Lung nodule 07/16/2011   Cough 04/21/2011   Wolff-Parkinson-White (WPW) syndrome    Osteoarthritis    Hyperlipidemia    IBS (irritable bowel syndrome)    Kidney stones     Arliss Journey, PT, DPT  03/20/2021, 1:07 PM  [facility] [address] [place], [place], [address] Phone: [phone]   Fax:  [phone]  Name: DEZIREE MOKRY MRN: [id number] Date of Birth: 1955/03/20

## 2021-03-21 DIAGNOSIS — R8290 Unspecified abnormal findings in urine: Secondary | ICD-10-CM | POA: Insufficient documentation

## 2021-03-25 ENCOUNTER — Ambulatory Visit: Payer: Medicare Other | Admitting: Physical Therapy

## 2021-03-27 ENCOUNTER — Ambulatory Visit: Payer: Medicare Other | Admitting: Rehabilitation

## 2021-03-27 ENCOUNTER — Other Ambulatory Visit: Payer: Self-pay

## 2021-03-27 ENCOUNTER — Encounter: Payer: Self-pay | Admitting: Rehabilitation

## 2021-03-27 DIAGNOSIS — R2681 Unsteadiness on feet: Secondary | ICD-10-CM

## 2021-03-27 DIAGNOSIS — R208 Other disturbances of skin sensation: Secondary | ICD-10-CM

## 2021-03-27 DIAGNOSIS — R293 Abnormal posture: Secondary | ICD-10-CM

## 2021-03-27 DIAGNOSIS — R296 Repeated falls: Secondary | ICD-10-CM

## 2021-03-27 DIAGNOSIS — M6281 Muscle weakness (generalized): Secondary | ICD-10-CM

## 2021-03-27 DIAGNOSIS — R2689 Other abnormalities of gait and mobility: Secondary | ICD-10-CM

## 2021-03-27 DIAGNOSIS — M542 Cervicalgia: Secondary | ICD-10-CM

## 2021-03-27 NOTE — Therapy (Signed)
[employer] [address] [place], [place], [address] Phone: [phone]   Fax:  [phone]  Physical Therapy Treatment  Patient Details  Name: Connie Haynes MRN: [id number] Date of Birth: 12/20/1955 Referring Provider (PT): Arlice Colt, MD   Encounter Date: 03/27/2021   PT End of Session - 03/27/21 1258     Visit Number 15    Number of Visits 33   Per updated POC   Date for PT Re-Evaluation 05/17/21   per updated POC   Authorization Type [employer] Medicare (10th visit PN needed)    Progress Note Due on Visit 20    PT Start Time 0845    PT Stop Time 0930    PT Time Calculation (min) 45 min    Equipment Utilized During Treatment Gait belt    Activity Tolerance Patient tolerated treatment well    Behavior During Therapy WFL for tasks assessed/performed             Past Medical History:  Diagnosis Date   Adrenal insufficiency ([place])    Anemia    denies   Anxiety    Basal cell carcinoma    eye lid   Chronic kidney disease, stage 3 (HCC)    Cough    hx. dry cough chronic   Disorder of tear duct system    frequent tearing of eyes   GERD (gastroesophageal reflux disease)    H/O: whooping cough 2000   History of kidney infection 2015   ? sepsis   History of kidney stones    History of kidney stones    Hyperlipidemia    Hypertension    IBS (irritable bowel syndrome)    controlled with med   Osteoarthritis    arthritis -joints-limited ROM shoulders more on right   PONV (postoperative nausea and vomiting)     per pt hx of low BP following shoulder surgery in 05-2015; was managed in hospital with IV fluids and d/c BP being taken a tthe time; pt was dced and F/U with endcrinoopgist who dx with adrenal insurffiency and started steroid therapy; treatmetnt with positive outome, now managed chronically on hydrocortisone tablets;   Sleep apnea    no longer using cpap   Stroke [facility])    per patient "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 Hulen Skains)   [place]     '93 for WPW syndrome   CARPAL TUNNEL RELEASE Right 12/21/2014   Procedure: RIGHT CARPAL TUNNEL RELEASE ;  Surgeon: Netta Cedars, MD;  Location: [place];  Service: Orthopedics;  Laterality: Right;   CARPAL TUNNEL RELEASE Left    CERVICAL SPINE SURGERY     cervical fusion with hardware retained   COLON SURGERY     "colon detached from abdomen"   COLONOSCOPY WITH PROPOFOL N/A 05/10/2014   Procedure: COLONOSCOPY WITH PROPOFOL;  Surgeon: Juanita Craver, MD;  [facility]: [facility];  Service: Endoscopy;  Laterality: N/A;   CYSTOSCOPY/RETROGRADE/URETEROSCOPY Left 12/18/2013   Procedure: CYSTOSCOPY/RETROGRADE/LEFT STENT;  Surgeon: Malka So, MD;  Location: [facility];  Service: Urology;  Laterality: Left;   ESOPHAGOGASTRODUODENOSCOPY (EGD) WITH PROPOFOL N/A 05/10/2014   Procedure: ESOPHAGOGASTRODUODENOSCOPY (EGD) WITH PROPOFOL;  Surgeon: Juanita Craver, MD;  [facility];  Service: Endoscopy;  Laterality: N/A;   EYELID CARCINOMA EXCISION     INSERTION OF DIALYSIS CATHETER N/A 08/16/2019   Procedure: INSERTION OF DIALYSIS CATHETER;  Surgeon: Garner Nash, DO;  Location: [place];  Service: Pulmonary;  Laterality: N/A;   JOINT REPLACEMENT     KNEE ARTHROSCOPY Left 07/16/2016   REVERSE SHOULDER ARTHROPLASTY Right 06/29/2014   Procedure: REVERSE TOTAL SHOULDER ARTHROPLASTY;  Surgeon: Netta Cedars, MD;  Location: [place];  Service: Orthopedics;  Laterality: Right;   REVISION TOTAL SHOULDER TO  REVERSE TOTAL SHOULDER Left 05/15/2015   REVISION TOTAL SHOULDER TO REVERSE TOTAL SHOULDER Left 05/15/2015   Procedure: LEFT SHOULDER REVISION TOTAL SHOULDER ARTHRPLASTY TO REVERSE TOTAL SHOULDER ARTHROPLASTY ;  Surgeon: Netta Cedars, MD;  Location: [facility];  Service: Orthopedics;  Laterality: Left;   tear duct surgery     TOTAL ABDOMINAL HYSTERECTOMY     TOTAL HIP ARTHROPLASTY     x 3(rt x2, lt x1)   TOTAL KNEE ARTHROPLASTY Right 08/08/2013   Procedure: RIGHT TOTAL KNEE ARTHROPLASTY WITH SAPHENOUS NERVE SCAR EXCISION;  Surgeon: Mauri Pole, MD;  [facility];  Service: Orthopedics;  Laterality: Right;   TOTAL KNEE ARTHROPLASTY Left 08/25/2016   Procedure: LEFT TOTAL KNEE ARTHROPLASTY;  Surgeon: Paralee Cancel, MD;  [facility];  Service: Orthopedics;  Laterality: Left;  70 mins; Adductor block   TOTAL SHOULDER ARTHROPLASTY Left 07/15/2012   Procedure: LEFT TOTAL SHOULDER ARTHROPLASTY;  Surgeon: Augustin Schooling, MD;  Location: [facility];  Service: Orthopedics;  Laterality: Left;   TOTAL SHOULDER REVISION Right 12/21/2014   Procedure: RIGHT SHOULDER POLY EXCHANGE ;  Surgeon: Netta Cedars, MD;  Location: [place];  Service: Orthopedics;  Laterality: Right;   URETEROSCOPY  stent placement    There were no vitals filed for this visit.   Subjective Assessment - 03/27/21 0851     Subjective Reports has UTI is on antibiotics that made her sick, is going to try new one today.  Neck pain was better after our last session, but slept on it the other night and is bad this morning.    Pertinent History Adrenal insufficiency, HTN, wearing 2.5L O2 only at night.    Patient Stated Goals To work on my balance and walking    Currently in Pain? Yes    Pain Score 10-Worst pain ever   with movement   Pain Location Neck    Pain Orientation Left    Pain Descriptors / Indicators Aching;Sore;Tightness    Pain Type Acute pain    Pain Onset 1 to 4 weeks ago    Pain Frequency Constant    Aggravating Factors  moving  it    Pain Relieving Factors roll on Aleve                               [facility] Adult PT Treatment/Exercise - 03/27/21 0850       Transfers   Transfers Sit to Stand;Stand to Sit    Sit to Stand 6: Modified independent (Device/Increase time)    Stand to Sit 6: Modified independent (Device/Increase time)      Ambulation/Gait   Ambulation/Gait Yes    Ambulation/Gait Assistance 6: Modified independent (Device/Increase  time)    Ambulation/Gait Assistance Details From task to task during session    Ambulation Distance (Feet) 100 Feet    Assistive device None    Gait Pattern Step-through pattern;Decreased stride length;Decreased arm swing - right;Decreased arm swing - left;Trunk flexed;Wide base of support      High Level Balance   High Level Balance Comments Continue to work on stepping strategy during session with forward stepping (alt LE) x 20 reps at counter top for support.  Note that she tends to do a forward lunge rather than just step foward, so provided cues for slightly smaller step.  Also cues for lateral shift opposite prior to stepping.  Less truncal movements today, esp with increased practice.  Performed lateral stepping x 10 reps each direction with BUE support, backwards stepping (alt LE), and crossing midline x 10 reps each side with cues for placing weight through LE when crossed midline. She had most difficulty with crossing midline, and needs to look down and feet entire time.      Self-Care   Self-Care Other Self-Care Comments    Other Self-Care Comments  Encouraged her to don heat pack 5-10 mins and then try using cane handle again to perform gentle STM/trigger point release at home as she reports that PT manual therapy is working.  Pt verbalized understanding.      Manual Therapy   Manual Therapy Soft tissue mobilization;Myofascial release    Manual therapy comments Supine    Soft tissue mobilization VERY light STM to L upper trap, levator and  cervical paraspinals.  Pt very limited due to amount of pain/tenderness.  She did seem to tolerate somewhat better today and reports that it is helping.    Myofascial Release Light trigger point release to what pt could tolerate to L upper trap/levator.                       PT Short Term Goals - 03/18/21 8127       PT SHORT TERM GOAL #1   Title Pt will  be IND w/ initial HEP in order to indicate improved functional mobility and dec fall risk.  (Target Date:04/17/21 )    Time 4    Period Weeks    Status On-going    Target Date 04/17/21      PT SHORT TERM GOAL #2   Title Pt will score >/=53/56 on BERG balance test in order to indicate dec fall risk.    Baseline 50/56    Time 4    Period Weeks    Status Revised      PT SHORT TERM GOAL #3   Title Pt will improve gait speed to >/=2.62 ft/sec with device (if needed) in order to indicate dec fall risk.    Baseline 2.22 ft/sec    Time 4    Period Weeks    Status Revised      PT SHORT TERM GOAL #4   Title Will assess DGI in order to have a better assessment of dynamic balance and fall risk with functional mobility.    Time 4    Period Weeks    Status Revised      PT SHORT TERM GOAL #5   Title Pt will ambulate x 500' over unlevel paved outdoor surfaces at S level in order to be more independent in community.    Time 4    Period Weeks    Status New  PT Long Term Goals - 03/18/21 6283       PT LONG TERM GOAL #1   Title Pt will be IND with final HEP in order to indicate improved functional mobility and dec fall risk. (Target Date: 05/17/21)    Time 8    Period Weeks    Status On-going      PT LONG TERM GOAL #2   Title Pt will improve DGI by 3 points from baseline in order to indicate dec fall risk.    Time 8    Period Weeks    Status New      PT LONG TERM GOAL #3   Title Pt will ambulate with gait speed >/=3.0 ft/sec with device (if needed) in order to indicate dec fall risk.    Time 8     Period Weeks    Status Revised      PT LONG TERM GOAL #4   Title Pt will ambulate 1000' w/ LRAD over unlevel paved surfaces (with device if needed) at S level in order to indicate improved community safety.    Time 8    Period Weeks    Status Revised      PT LONG TERM GOAL #5   Title Pt will ambulate 25' over grassy surfaces at S level in order to indicate improved safety with outdoor/community tasks.    Time 8    Period Weeks    Status New      Additional Long Term Goals   Additional Long Term Goals Yes      PT LONG TERM GOAL #6   Title Pt will report no more than 2/10 pain in L upper trap/cervical region in order to indicate improved posture and flexibility with functional mobility.    Time 8    Period Weeks    Status New                   Plan - 03/27/21 1259     Clinical Impression Statement Skilled session continued to focus on manual therapy to L cervical region/upper trap as this seems to provide some relief, however think this will be difficult to maintain due to her poor posture with mobility.  We also continued to work on stepping strategies as in last session.  Pt with some improvement this session, but still difficult to step LLE in any direction.    Personal Factors and Comorbidities Comorbidity 3+;Time since onset of injury/illness/exacerbation    Comorbidities see above    Examination-Activity Limitations Dressing;Locomotion Level;Squat;Stairs;Stand;Lift;Transfers    Examination-Participation Restrictions Cleaning;Community Activity;Driving;Laundry;Shop;Meal Prep    Stability/Clinical Decision Making Evolving/Moderate complexity    Rehab Potential Good    PT Frequency 2x / week    PT Duration 8 weeks    PT Treatment/Interventions ADLs/Self Care Home Management;Aquatic Therapy;DME Instruction;Gait training;Stair training;Functional mobility training;Therapeutic activities;Therapeutic exercise;Balance training;Neuromuscular re-education;Patient/family  education;Orthotic Fit/Training;Energy conservation;Vestibular;Moist Heat;Manual techniques;Passive range of motion;Taping;Dry needling    PT Next Visit Plan continue to work on strengthening, balance training, gait outdoors and stepping strategies (stepping to targets, coming close to and crossing midline), hip strategy    PT Home Exercise Plan Access Code: [email or web address]    Consulted and Agree with Plan of Care Patient             Patient will benefit from skilled therapeutic intervention in order to improve the following deficits and impairments:  Abnormal gait, Cardiopulmonary status limiting activity, Decreased activity tolerance, Decreased balance, Decreased coordination, Decreased endurance, Decreased knowledge of use  of DME, Decreased mobility, Decreased strength, Difficulty walking, Impaired perceived functional ability, Impaired sensation, Postural dysfunction, Impaired flexibility, Improper body mechanics  Visit Diagnosis: Unsteadiness on feet  Muscle weakness (generalized)  Repeated falls  Other disturbances of skin sensation  Other abnormalities of gait and mobility  Cervicalgia  Abnormal posture     Problem List Patient Active Problem List   Diagnosis Date Noted   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[place]) 10/30/2020   Encounter for immunotherapy 10/30/2020   History of optic neuritis 10/30/2020   Urinary retention 07/17/2020   Encounter for monitoring immunomodulating therapy 05/02/2020   Numbness 08/29/2019   Gait disturbance 08/29/2019   Abnormal brain MRI 08/29/2019   High risk medication use 08/29/2019   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   Adrenal insufficiency (Addison's disease) ([place]) 08/11/2019   S/P left TKA 08/25/2016   S/P total knee replacement 08/25/2016   S/P shoulder replacement 06/29/2014   OSA (obstructive sleep apnea) 01/22/2014   Left ureteral stone 12/18/2013   UTI (urinary tract infection) 12/18/2013   Morbid obesity ([place]) 08/09/2013   S/P right TKA  08/08/2013   Chronic pain syndrome 03/04/2013   Depressive disorder, not elsewhere classified 03/04/2013   Need for prophylactic vaccination and inoculation against influenza 10/04/2012   Erosive osteoarthritis of multiple sites 10/04/2012   Chronic cough 10/04/2012   Swelling of limb 08/21/2012   Essential hypertension, benign 08/21/2012   Preoperative general physical examination 06/12/2012   Restless leg syndrome 06/12/2012   Anxiety state, unspecified 06/12/2012   Lung nodule 07/16/2011   Cough 04/21/2011   Wolff-Parkinson-White (WPW) syndrome    Osteoarthritis    Hyperlipidemia    IBS (irritable bowel syndrome)    Kidney stones     Cameron Sprang, PT, MPT [facility] [address] [place], [place], [address] Phone: [phone]   Fax:  [phone] 03/27/21, 1:02 PM   Name: Connie Haynes MRN: [id number] Date of Birth: 11-10-1955

## 2021-04-02 ENCOUNTER — Ambulatory Visit: Payer: Medicare Other | Admitting: Physical Therapy

## 2021-04-07 ENCOUNTER — Encounter: Payer: Self-pay | Admitting: Neurology

## 2021-04-08 ENCOUNTER — Ambulatory Visit: Payer: Medicare Other | Admitting: Rehabilitation

## 2021-04-08 ENCOUNTER — Encounter: Payer: Self-pay | Admitting: Rehabilitation

## 2021-04-08 ENCOUNTER — Other Ambulatory Visit: Payer: Self-pay

## 2021-04-08 DIAGNOSIS — R2681 Unsteadiness on feet: Secondary | ICD-10-CM

## 2021-04-08 DIAGNOSIS — M6281 Muscle weakness (generalized): Secondary | ICD-10-CM

## 2021-04-08 DIAGNOSIS — R2689 Other abnormalities of gait and mobility: Secondary | ICD-10-CM

## 2021-04-08 NOTE — Patient Instructions (Signed)
Access Code: [email or web address] URL: [email or web address] Date: 04/08/2021 Prepared by: Cameron Sprang  Exercises Standing March with Counter Support - 1 x daily - 5 x weekly - 2 sets - 10 reps Standing Single Leg Stance with Counter Support - 1 x daily - 5 x weekly - 1 sets - 3 reps - 10 hold Side Stepping with Counter Support - 1 x daily - 5 x weekly - 1 sets - 3 reps Romberg Stance on Foam Pad - 1 x daily - 7 x weekly - 1 sets - 3 reps - 20 secs hold Romberg Stance with Head Nods on Foam Pad - 1 x daily - 7 x weekly - 1 sets - 10 reps Romberg Stance on Foam Pad with Head Rotation - 1 x daily - 7 x weekly - 1 sets - 10 reps

## 2021-04-08 NOTE — Therapy (Signed)
[facility] [address] [place], [place], [address] Phone: [phone]   Fax:  [phone]  Physical Therapy Treatment  Patient Details  Name: Connie Haynes MRN: [id number] Date of Birth: 25-Dec-1955 Referring Provider (PT): Arlice Colt, MD   Encounter Date: 04/08/2021   PT End of Session - 04/08/21 1211     Visit Number 16    Number of Visits 33   Per updated POC   Date for PT Re-Evaluation 05/17/21   per updated [employer] (10th visit PN needed)    Progress Note Due on Visit 20    PT Start Time 0932    PT Stop Time 1013    PT Time Calculation (min) 41 min    Equipment Utilized During Treatment Gait belt    Activity Tolerance Patient tolerated treatment well    Behavior During Therapy WFL for tasks assessed/performed             Past Medical History:  Diagnosis Date   Adrenal insufficiency ([place])    Anemia    denies   Anxiety    Basal cell carcinoma    eye lid   Chronic kidney disease, stage 3 (HCC)    Cough    hx. dry cough chronic   Disorder of tear duct system    frequent tearing of eyes   GERD (gastroesophageal reflux disease)    H/O: whooping cough 2000   History of kidney infection 2015   ? sepsis   History of kidney stones    History of kidney stones    Hyperlipidemia    Hypertension    IBS (irritable bowel syndrome)    controlled with med   Osteoarthritis    arthritis -joints-limited ROM shoulders more on right   PONV (postoperative nausea and vomiting)     per pt hx of low BP following shoulder surgery in 05-2015; was managed in hospital with IV fluids and d/c BP being taken a tthe time; pt was dced and F/U with endcrinoopgist who dx with adrenal insurffiency and started steroid therapy; treatmetnt with positive outome, now managed chronically on hydrocortisone tablets;   Sleep apnea    no longer using cpap   Stroke [facility])    per patient "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 Hulen Skains)   [place]     '93 for WPW syndrome   CARPAL TUNNEL RELEASE Right 12/21/2014   Procedure: RIGHT CARPAL TUNNEL RELEASE ;  Surgeon: Netta Cedars, MD;  Location: [place];  Service: Orthopedics;  Laterality: Right;   CARPAL TUNNEL RELEASE Left    CERVICAL SPINE SURGERY     cervical fusion with hardware retained   COLON SURGERY     "colon detached from abdomen"   COLONOSCOPY WITH PROPOFOL N/A 05/10/2014   Procedure: COLONOSCOPY WITH PROPOFOL;  Surgeon: Juanita Craver, MD;  [facility];  Service: Endoscopy;  Laterality: N/A;   CYSTOSCOPY/RETROGRADE/URETEROSCOPY Left 12/18/2013   Procedure: CYSTOSCOPY/RETROGRADE/LEFT STENT;  Surgeon: Malka So, MD;  Location: [facility];  Service: Urology;  Laterality: Left;   ESOPHAGOGASTRODUODENOSCOPY (EGD) WITH PROPOFOL N/A 05/10/2014   Procedure: ESOPHAGOGASTRODUODENOSCOPY (EGD) WITH PROPOFOL;  Surgeon: Juanita Craver, MD;  [facility];  Service: Endoscopy;  Laterality: N/A;   EYELID CARCINOMA EXCISION     INSERTION OF DIALYSIS CATHETER N/A 08/16/2019   Procedure: INSERTION OF DIALYSIS CATHETER;  Surgeon: Garner Nash, DO;  Location: [place];  Service: Pulmonary;  Laterality: N/A;   JOINT REPLACEMENT     KNEE ARTHROSCOPY Left 07/16/2016   REVERSE SHOULDER ARTHROPLASTY Right 06/29/2014   Procedure: REVERSE TOTAL SHOULDER ARTHROPLASTY;  Surgeon: Netta Cedars, MD;  Location: [place];  Service: Orthopedics;  Laterality: Right;   REVISION TOTAL SHOULDER TO  REVERSE TOTAL SHOULDER Left 05/15/2015   REVISION TOTAL SHOULDER TO REVERSE TOTAL SHOULDER Left 05/15/2015   Procedure: LEFT SHOULDER REVISION TOTAL SHOULDER ARTHRPLASTY TO REVERSE TOTAL SHOULDER ARTHROPLASTY ;  Surgeon: Netta Cedars, MD;  Location: [place];  Service: Orthopedics;  Laterality: Left;   tear duct surgery     TOTAL ABDOMINAL HYSTERECTOMY     TOTAL HIP ARTHROPLASTY     x 3(rt x2, lt x1)   TOTAL KNEE ARTHROPLASTY Right 08/08/2013   Procedure: RIGHT TOTAL KNEE ARTHROPLASTY WITH SAPHENOUS NERVE SCAR EXCISION;  Surgeon: Mauri Pole, MD;  [facility];  Service: Orthopedics;  Laterality: Right;   TOTAL KNEE ARTHROPLASTY Left 08/25/2016   Procedure: LEFT TOTAL KNEE ARTHROPLASTY;  Surgeon: Paralee Cancel, MD;  [facility];  Service: Orthopedics;  Laterality: Left;  70 mins; Adductor block   TOTAL SHOULDER ARTHROPLASTY Left 07/15/2012   Procedure: LEFT TOTAL SHOULDER ARTHROPLASTY;  Surgeon: Augustin Schooling, MD;  Location: [place];  Service: Orthopedics;  Laterality: Left;   TOTAL SHOULDER REVISION Right 12/21/2014   Procedure: RIGHT SHOULDER POLY EXCHANGE ;  Surgeon: Netta Cedars, MD;  Location: [place];  Service: Orthopedics;  Laterality: Right;   URETEROSCOPY  stent placement    There were no vitals filed for this visit.   Subjective Assessment - 04/08/21 0942     Subjective Pt reports she would like to take a break after today to get neck assessed as it is progressively getting worse, has symptoms into arms and does endorse weaker grasp.    Pertinent History Adrenal insufficiency, HTN, wearing 2.5L O2 only at night.    Patient Stated Goals To work on my balance and walking    Currently in Pain? Yes    Pain Score 8     Pain Location Neck    Pain Orientation Left    Pain Descriptors / Indicators Aching;Sore;Burning    Pain Type Acute pain    Pain Onset 1 to 4 weeks ago    Pain Frequency Constant    Aggravating Factors  sleeping or at night    Pain Relieving Factors better  during the day                 Self Care: Discussion about getting seen by ortho next week vs getting into ortho urgent care due to symptoms seeming to progress more over the last 1-2 weeks.  Pt verbalized understanding.     NMR: Reviewed and updated HEP as follows.  Performed as stated below.    Access Code: [email or web address] URL: [email or web address] Date: 04/08/2021 Prepared by: Cameron Sprang   Exercises Standing March with Counter Support - 1 x daily - 5 x weekly - 2 sets - 10 reps Standing Single  Leg Stance with Counter Support - 1 x daily - 5 x weekly - 1 sets - 3 reps - 10 hold Side Stepping with Counter Support - 1 x daily - 5 x weekly - 1 sets - 3 reps Romberg Stance on Foam Pad - 1 x daily - 7 x weekly - 1 sets - 3 reps - 20 secs hold Romberg Stance with Head Nods on Foam Pad - 1 x daily - 7 x weekly - 1 sets - 10 reps Romberg Stance on Foam Pad with Head Rotation - 1 x daily - 7 x weekly - 1 sets - 10 reps                  PT Short Term Goals - 03/18/21 1601       PT SHORT TERM GOAL #1   Title Pt will  be IND w/ initial HEP in order to indicate improved functional mobility and dec fall risk.  (Target Date:04/17/21 )    Time 4    Period Weeks    Status On-going    Target Date 04/17/21      PT SHORT TERM GOAL #2   Title Pt will score >/=53/56 on BERG balance test in order to indicate dec fall risk.    Baseline 50/56    Time 4    Period Weeks    Status Revised      PT SHORT TERM GOAL #3   Title Pt will improve gait speed to >/=2.62 ft/sec with device (if needed) in order to indicate dec fall risk.    Baseline 2.22 ft/sec    Time 4    Period Weeks    Status Revised      PT SHORT TERM GOAL #4   Title Will assess DGI in order to have a better assessment of dynamic balance and fall risk with functional mobility.    Time 4    Period Weeks    Status Revised      PT SHORT TERM GOAL #5   Title Pt will ambulate x 500' over unlevel paved  outdoor surfaces at S level in order to be more independent in community.    Time 4    Period Weeks    Status New               PT Long Term Goals - 03/18/21 0932       PT LONG TERM GOAL #1   Title Pt will be IND with final HEP in order to indicate improved functional mobility and dec fall risk. (Target Date: 05/17/21)    Time 8    Period Weeks    Status On-going      PT LONG TERM GOAL #2   Title Pt will improve DGI by 3 points from baseline in order to indicate dec fall risk.    Time 8    Period Weeks    Status New      PT LONG TERM GOAL #3   Title Pt will ambulate with gait speed >/=3.0 ft/sec with device (if needed) in order to indicate dec fall risk.    Time 8    Period Weeks    Status Revised      PT LONG TERM GOAL #4   Title Pt will ambulate 1000' w/ LRAD over unlevel paved surfaces (with device if needed) at S level in order to indicate improved community safety.    Time 8    Period Weeks  Status Revised      PT LONG TERM GOAL #5   Title Pt will ambulate 25' over grassy surfaces at S level in order to indicate improved safety with outdoor/community tasks.    Time 8    Period Weeks    Status New      Additional Long Term Goals   Additional Long Term Goals Yes      PT LONG TERM GOAL #6   Title Pt will report no more than 2/10 pain in L upper trap/cervical region in order to indicate improved posture and flexibility with functional mobility.    Time 8    Period Weeks    Status New                   Plan - 04/08/21 1212     Clinical Impression Statement Pt requesting to take a break following today's session in order to get neck assessed.  It has progressively gotten worse and now having more symptoms into arms and affecting strength.  She did get an appt next week with her ortho, however did give her info that [facility] (where her MD is) has urgent care if symptoms progress anymore.  Updated her HEP so that she has things to work on while on  break.  She will call us to schedule when she is able and knows that she needs new order if something does change medically.    Personal Factors and Comorbidities Comorbidity 3+;Time since onset of injury/illness/exacerbation    Comorbidities see above    Examination-Activity Limitations Dressing;Locomotion Level;Squat;Stairs;Stand;Lift;Transfers    Examination-Participation Restrictions Cleaning;Community Activity;Driving;Laundry;Shop;Meal Prep    Stability/Clinical Decision Making Evolving/Moderate complexity    Rehab Potential Good    PT Frequency 2x / week    PT Duration 8 weeks    PT Treatment/Interventions ADLs/Self Care Home Management;Aquatic Therapy;DME Instruction;Gait training;Stair training;Functional mobility training;Therapeutic activities;Therapeutic exercise;Balance training;Neuromuscular re-education;Patient/family education;Orthotic Fit/Training;Energy conservation;Vestibular;Moist Heat;Manual techniques;Passive range of motion;Taping;Dry needling    PT Next Visit Plan When she returns, check goals, recert and update any goals.    PT Home Exercise Plan Access Code: [email or web address]    Consulted and Agree with Plan of Care Patient             Patient will benefit from skilled therapeutic intervention in order to improve the following deficits and impairments:  Abnormal gait, Cardiopulmonary status limiting activity, Decreased activity tolerance, Decreased balance, Decreased coordination, Decreased endurance, Decreased knowledge of use of DME, Decreased mobility, Decreased strength, Difficulty walking, Impaired perceived functional ability, Impaired sensation, Postural dysfunction, Impaired flexibility, Improper body mechanics  Visit Diagnosis: Unsteadiness on feet  Muscle weakness (generalized)  Other abnormalities of gait and mobility     Problem List Patient Active Problem List   Diagnosis Date Noted   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[place]) 10/30/2020   Encounter for  immunotherapy 10/30/2020   History of optic neuritis 10/30/2020   Urinary retention 07/17/2020   Encounter for monitoring immunomodulating therapy 05/02/2020   Numbness 08/29/2019   Gait disturbance 08/29/2019   Abnormal brain MRI 08/29/2019   High risk medication use 08/29/2019   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[place]) 08/11/2019   Adrenal insufficiency (Addison's disease) ([place]) 08/11/2019   S/P left TKA 08/25/2016   S/P total knee replacement 08/25/2016   S/P shoulder replacement 06/29/2014   OSA (obstructive sleep apnea) 01/22/2014   Left ureteral stone 12/18/2013   UTI (urinary tract infection) 12/18/2013   Morbid obesity ([place]) 08/09/2013   S/P right TKA 08/08/2013   Chronic  pain syndrome 03/04/2013   Depressive disorder, not elsewhere classified 03/04/2013   Need for prophylactic vaccination and inoculation against influenza 10/04/2012   Erosive osteoarthritis of multiple sites 10/04/2012   Chronic cough 10/04/2012   Swelling of limb 08/21/2012   Essential hypertension, benign 08/21/2012   Preoperative general physical examination 06/12/2012   Restless leg syndrome 06/12/2012   Anxiety state, unspecified 06/12/2012   Lung nodule 07/16/2011   Cough 04/21/2011   Wolff-Parkinson-White (WPW) syndrome    Osteoarthritis    Hyperlipidemia    IBS (irritable bowel syndrome)    Kidney stones     Cameron Sprang, PT, MPT [facility] [address] [place], [place], [address] Phone: [phone]   Fax:  [phone] 04/08/21, 12:15 PM   Name: Connie Haynes MRN: [id number] Date of Birth: 07-31-1955

## 2021-04-10 ENCOUNTER — Ambulatory Visit: Payer: Medicare Other | Admitting: Rehabilitation

## 2021-04-15 ENCOUNTER — Ambulatory Visit: Payer: Medicare Other | Admitting: Physical Therapy

## 2021-04-16 ENCOUNTER — Other Ambulatory Visit: Payer: Self-pay | Admitting: Physician Assistant

## 2021-04-16 DIAGNOSIS — M542 Cervicalgia: Secondary | ICD-10-CM

## 2021-04-17 ENCOUNTER — Ambulatory Visit: Payer: Medicare Other | Admitting: Physical Therapy

## 2021-04-21 ENCOUNTER — Other Ambulatory Visit: Payer: Medicare Other

## 2021-04-21 ENCOUNTER — Inpatient Hospital Stay
Admission: RE | Admit: 2021-04-21 | Discharge: 2021-04-21 | Disposition: A | Discharge status: Home / Self Care | Payer: Medicare Other | Source: Ambulatory Visit | Attending: Physician Assistant | Admitting: Physician Assistant

## 2021-04-21 NOTE — Discharge Instructions (Signed)

## 2021-04-22 ENCOUNTER — Ambulatory Visit: Payer: Medicare Other | Admitting: Physical Therapy

## 2021-04-24 ENCOUNTER — Ambulatory Visit: Payer: Medicare Other | Admitting: Rehabilitation

## 2021-05-01 ENCOUNTER — Encounter: Payer: Self-pay | Admitting: Neurology

## 2021-05-01 ENCOUNTER — Ambulatory Visit (INDEPENDENT_AMBULATORY_CARE_PROVIDER_SITE_OTHER): Payer: Medicare Other | Admitting: Neurology

## 2021-05-01 VITALS — BP 143/83 | HR 67 | Ht 65.0 in | Wt 296.0 lb

## 2021-05-01 DIAGNOSIS — R208 Other disturbances of skin sensation: Secondary | ICD-10-CM

## 2021-05-01 DIAGNOSIS — R339 Retention of urine, unspecified: Secondary | ICD-10-CM

## 2021-05-01 DIAGNOSIS — R200 Anesthesia of skin: Secondary | ICD-10-CM

## 2021-05-01 DIAGNOSIS — G369 Acute disseminated demyelination, unspecified: Secondary | ICD-10-CM | POA: Diagnosis not present

## 2021-05-01 DIAGNOSIS — M542 Cervicalgia: Secondary | ICD-10-CM

## 2021-05-01 DIAGNOSIS — Z298 Encounter for other specified prophylactic measures: Secondary | ICD-10-CM | POA: Diagnosis not present

## 2021-05-01 DIAGNOSIS — Z2989 Encounter for other specified prophylactic measures: Secondary | ICD-10-CM

## 2021-05-01 DIAGNOSIS — G959 Disease of spinal cord, unspecified: Secondary | ICD-10-CM | POA: Diagnosis not present

## 2021-05-01 DIAGNOSIS — R269 Unspecified abnormalities of gait and mobility: Secondary | ICD-10-CM

## 2021-05-01 MED ORDER — LAMOTRIGINE 25 MG PO TABS: ORAL_TABLET | ORAL | 5 refills | Status: DC

## 2021-05-01 MED ORDER — LAMOTRIGINE 25 MG PO TABS: 25.0000 mg | ORAL_TABLET | Freq: Every day | ORAL | 5 refills | Status: DC

## 2021-05-01 NOTE — Progress Notes (Signed)
? ? ?[facility] ? ?PATIENT: Connie Haynes ?DOB: 1955-07-30 ? ?REFERRING DOCTOR OR PCP: Elza Rafter, MD ?SOURCE: Patient, notes from recent hospitalization, imaging and laboratory reports, MRI images personally reviewed. ? ?_________________________________ ? ? ?HISTORICAL ? ?CHIEF COMPLAINT:  ?Chief Complaint  ?Patient presents with  ? Follow-up  ?  Rm 1, alone. Here for 6 month neuromyelitis f/u. Sx are about the same since last ov. No sensation from knees down. L leg tends to become more numb as it gets closer to next infusion.   ? ? ?HISTORY OF PRESENT ILLNESS:  ?Connie Haynes is a 66 y.o. 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  She has been stable ad tolerates the infusion well.   She feels a little weaker in her left leg towards the end of each 6 month cycle but better after the infusion. .      When weakness is worse , she has trouble walking.   Next Rituxan dose is April 2023. . Due to itching, she is taking prednisone the day befpre the infusion now with benefit.   ? ?Her LETM and left ON occurred within a few days.  The vision change noticed shortly after presenting to the hospital for her leg/groin numbness,   ? ?She has chronic very reduced sensation below her knee and some left numbness o hip.  Arms are not numb.   . She feels weaker in legs as day goes on (worse on left).   She needs to hold the wall when she takes a shower.     She sometimes leans against the wall.   She also gets edema in the left leg.   PT has helped some.  She will be released shortly.      She has dysesthesias on legs butgabapentin made her feel very strange.    ? ?She has a left altitudinal visual field cut (confirmed on recent [facility] VF testing with ophth).  She feels that this is stable ? ?Bladder issues continue.   She will feel some pressure when the bladder is full but not every time.   Similar with bowel.    Scheduled urination and defecation may help some.  Hesitancy is a problem.     She does not think she has had UTI.   ?  ?Repeat spine imaging MRIs 07/23/2020 compared them to the previous ones.  The MRIs look better showing no new lesions and fairly complete resolution of the enhancing focus that had extended from the mid cervical spine to the upper thoracic spine. ? ?She has had adrenal insufficiency dx in 2018 and is on Cortef.   ? ?She has more neck pain since almost falling and catching self.   Orthopedics would like to do a myelogram (as she has ACDF artifact on MRI.   She is concerend due to h/o mild CRF (creatinine is 1.06 now) ?  ?She received the Covid 19 vaccination [employer]) February 1, February 22, Oct 01 2019.    ? ?LETM/NMOSD HISTORY ?In late June 2021, she had the onset of urinary retention and the next morning had numbness from the waist down.   She initially presented to the ED but left due to the long wait.   She re-presented to [facility] emergency room when she had worsening with more intense numbness, weakness and reduced gait.  MRIs showed a longitudinal enhancing lesion in the lower cervical and  upper thoracic spine (C4-T4) and the brain showed 2 foci, one that enhanced near the basal ganglia on the right.    She was admitted for further evaluation and treatment.  She had high dose steroids for 5 days but did not note a benefit.  She then had 5 treatments of plasma exchange..    Strength improved 08/22/2019 and she was able to take a few steps.   She was doing some physical therapy while in the hospital.  By the time of discharge, she was able to use a walker.  She improved further over the next month but continued to have numbness and reduced gait.     ? ?We received the results of the [facility] anti-NMO and anti-MOG test.  Both were negative.  MRIs showed a longitudinal enhancing lesion in the lower cervical and upper thoracic spine (C4-T4) and the brain showed 2 foci, one that enhanced near  the basal ganglia on the right.  We had discussed that, despite that result, I am very concerned that she is at an elevated risk of a relapse.  Besides the inflammatory transverse myelitis, she also had an enhancing lesion in the brain.  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  Other tests including vasculitis labs were negative though CNS vasculitis would not always show up in blood work. ? ?She has adrenal insufficiency and is on Cortef.  She also has stage 2 CKD, HTN, WPW (S/p ablation).  I personally reviewed the MRIs of the lumbar spine, thoracic spine, cervical spine and brain performed 08/10/2019 and 08/11/2019. ? ?She had increased numbness in legs 07/2020.    ? ?Imaging review: ?MRI brain 08/11/2019: There is a 9 mm enhancing focus in the inferior right frontal lobe associated with some mass-effect.  Additionally, there is a small left T2/flair hyperintense focus just above the left internal capsule.  The etiology of the focus lesion is uncertain.  It could represent a focus of demyelination.  Neoplasm cannot be completely ruled out.  The more chronic appearing focus could be demyelinating or ischemic ? ?MRI of the cervical spine and thoracic spine 08/10/2019: Central to posterior enhancing focus within the spinal cord from C4-T4.  This causes some enlargement of the cord.  Additionally there is ACDF at C5-C7 and DJD at C3-C4 and C4-C5. ? ?MRI of the lumbar spine 08/10/2019: There is anterolisthesis of L3 upon L4 associated with facet hypertrophy and protrusion of the uncovered disc.  There is foraminal narrowing, more on the right.  Milder degenerative changes are noted at some of the lumbar levels but no spinal stenosis or significant nerve root compression. ? ?MRI of the cervical and thoracic spine 01/10/2020 showed greatly improved appearance of the abnormal signal.  On this MRI, there is just minimal T2 hyperintensity adjacent to C6-C7 and mild abnormal enhancement posteriorly adjacent to C7. ? ?MRI of  the brain 01/10/2020 showed interval improvement of the enhancing focus in the inferior right frontal lobe that have been seen on the 08/11/2019 MRI.  There was still a small focus of enhancement in that region.  Several other T2/FLAIR hyperintense foci in the hemispheres are more consistent with minimal stable chronic microvascular ischemic change. ? ?MRI of the cervical and thoracic spine 07/23/2020 showed complete resolution of the enhancing focus from C4-T4 that has been seen on previous MRIs. ? ?Laboratory review ?While in the hospital 08/2019, she had a LP (CSF was normal except slightly elevated protein); NMO Ab (negative), anti-MOG (negative).  Additionally  she had ANCA, HIV, TB, B12, rheumatoid factor, anti-Jo 1, ENA, HSV, ACE.  These were all negative or normal.    July 2021:  Hep B cAb, Hep B sAg, Hep B sAb all negative.    ANA was negative ? ?Repeat [facility] anti-Mog and anti-NMO were negative 09/2019 ? ? ?REVIEW OF SYSTEMS: ?Constitutional: No fevers, chills, sweats, or change in appetite ?Eyes: No visual changes, double vision, eye pain ?Ear, nose and throat: No hearing loss, ear pain, nasal congestion, sore throat ?Cardiovascular: No chest pain, palpitations ?Respiratory:  No shortness of breath at rest or with exertion.   No wheezes ?GastrointestinaI: No nausea, vomiting, diarrhea, abdominal pain, fecal incontinence ?Genitourinary:  No dysuria, urinary retention or frequency.  No nocturia. ?Musculoskeletal:  No neck pain, back pain ?Integumentary: No rash, pruritus, skin lesions ?Neurological: as above ?Psychiatric: No depression at this time.  No anxiety ?Endocrine: No palpitations, diaphoresis, change in appetite, change in weigh or increased thirst ?Hematologic/Lymphatic:  No anemia, purpura, petechiae. ?Allergic/Immunologic: No itchy/runny eyes, nasal congestion, recent allergic reactions, rashes ? ?ALLERGIES: ?Allergies  ?Allergen Reactions  ? Codeine Nausea And Vomiting  ?  Can take if takes phernergan   ? Hydrocodone Nausea Only  ? Robaxin [Methocarbamol] Rash  ? ? ?HOME MEDICATIONS: ? ?Current Outpatient Medications:  ?  AMITIZA 24 MCG capsule, Take 24 mcg by mouth 2 (two) times daily. , Disp: , Rfl:

## 2021-05-01 NOTE — Patient Instructions (Signed)
The pharmacy has the prescription of lamotrigine 25 mg. Please take it as follows: For 1 week take 1 pill once a day. The second week, take 1 pill twice a day. The third week, take 1 pill in the morning and 2 at bedtime or evening. The fourth week take 2 pills twice a day.  Most people tolerate lamotrigine very well. Some will get a rash.  If you do get a significant rash stop the medicine immediately and let us know.  

## 2021-05-06 ENCOUNTER — Ambulatory Visit: Payer: Medicare Other | Admitting: Internal Medicine

## 2021-05-08 ENCOUNTER — Other Ambulatory Visit: Payer: Medicare Other

## 2021-05-08 ENCOUNTER — Other Ambulatory Visit (INDEPENDENT_AMBULATORY_CARE_PROVIDER_SITE_OTHER): Payer: Self-pay

## 2021-05-08 DIAGNOSIS — Z0289 Encounter for other administrative examinations: Secondary | ICD-10-CM

## 2021-05-09 ENCOUNTER — Other Ambulatory Visit: Payer: Self-pay | Admitting: Neurology

## 2021-05-09 LAB — IGG, IGA, IGM
IgA/Immunoglobulin A, Serum: 75 mg/dL — ABNORMAL LOW (ref 87–352)
IgG (Immunoglobin G), Serum: 471 mg/dL — ABNORMAL LOW (ref 586–1602)
IgM (Immunoglobulin M), Srm: 11 mg/dL — ABNORMAL LOW (ref 26–217)

## 2021-05-12 ENCOUNTER — Telehealth: Payer: Self-pay | Admitting: *Deleted

## 2021-05-12 NOTE — Telephone Encounter (Signed)
Received the following message from Dr. Felecia Shelling: "The IgG and IgM are very slightly lower this time than they were last time we checked.  Because the IgM is fairly low I am going to reduce her dose to 500 mg every 6 months.  Therefore, she will just have to do 1 infusion next month for 500 mg (rather than 2 doses separated by 2 weeks).  I sent a message to the patient.  ? ?Please let the infusion room now." ? ?Printed message and provided to [facility] 05/12/21. ?

## 2021-05-19 ENCOUNTER — Encounter: Payer: Self-pay | Admitting: Neurology

## 2021-05-19 ENCOUNTER — Encounter: Payer: Self-pay | Admitting: *Deleted

## 2021-05-19 ENCOUNTER — Other Ambulatory Visit: Payer: Self-pay | Admitting: *Deleted

## 2021-05-20 ENCOUNTER — Other Ambulatory Visit: Payer: Self-pay | Admitting: Neurology

## 2021-05-20 MED ORDER — TRAMADOL HCL 50 MG PO TABS: 50.0000 mg | ORAL_TABLET | Freq: Three times a day (TID) | ORAL | 1 refills | Status: DC

## 2021-05-21 ENCOUNTER — Telehealth: Payer: Self-pay

## 2021-05-21 NOTE — Telephone Encounter (Signed)
Received fax from [place] that [place] approved 05/21/21-11/20/21. PA # [place] [phone] Non-grandfathered [id number] ?

## 2021-05-21 NOTE — Telephone Encounter (Signed)
PA tramadol submitted on CMM. Key: [id number]. Waiting on determination from [place]. ?

## 2021-05-21 NOTE — Telephone Encounter (Signed)
PA for tramadol 50 was submitted via cover my meds and received instant approval.  ?Approvedtoday ?Your PA request has been approved. Additional information will be provided in the approval communication. (Message 1145) ? ?As long as you remain covered by the [facility] and there are no changes to ?your plan benefits, this request is approved for the following time period: ?05/21/2021 - 11/20/2021 ?

## 2021-06-24 DIAGNOSIS — R002 Palpitations: Secondary | ICD-10-CM | POA: Insufficient documentation

## 2021-07-01 ENCOUNTER — Encounter: Payer: Self-pay | Admitting: Neurology

## 2021-07-01 ENCOUNTER — Other Ambulatory Visit: Payer: Self-pay | Admitting: Neurology

## 2021-07-03 ENCOUNTER — Encounter: Payer: Self-pay | Admitting: Internal Medicine

## 2021-07-03 ENCOUNTER — Ambulatory Visit (INDEPENDENT_AMBULATORY_CARE_PROVIDER_SITE_OTHER): Payer: Medicare Other | Admitting: Internal Medicine

## 2021-07-03 VITALS — BP 134/74 | HR 61 | Ht 65.0 in | Wt 287.0 lb

## 2021-07-03 DIAGNOSIS — R002 Palpitations: Secondary | ICD-10-CM | POA: Diagnosis not present

## 2021-07-03 DIAGNOSIS — I456 Pre-excitation syndrome: Secondary | ICD-10-CM

## 2021-07-03 DIAGNOSIS — R079 Chest pain, unspecified: Secondary | ICD-10-CM | POA: Diagnosis not present

## 2021-07-03 MED ORDER — FUROSEMIDE 40 MG PO TABS: 80.0000 mg | ORAL_TABLET | ORAL | 3 refills | Status: DC

## 2021-07-03 NOTE — Progress Notes (Signed)
Patient Care Team: [name], MD (Inactive) as PCP - General (Family Medicine) Deboraha Sprang, MD as PCP - Electrophysiology (Cardiology) Sater, Nanine Means, MD (Neurology)   HPI  Connie Haynes is a 66 y.o. female seen seen in follow-up for palpitations and history of asymmetric lower extremity edema.  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 dry cough chronic   Disorder of tear duct system    frequent tearing of eyes   GERD (gastroesophageal reflux disease)    H/O: whooping cough 2000   History of kidney infection 2015   ? sepsis   History of kidney stones    History of kidney stones    Hyperlipidemia    Hypertension    IBS (irritable bowel syndrome)    controlled with med   Osteoarthritis    arthritis -joints-limited ROM shoulders more on right   PONV (postoperative nausea and vomiting)     per pt hx of low BP following shoulder surgery in 05-2015; was managed in hospital with IV fluids and d/c BP being taken a tthe time; pt was dced and F/U with endcrinoopgist who dx with adrenal insurffiency and started steroid therapy; treatmetnt with positive outome, now managed chronically on hydrocortisone tablets;   Sleep apnea    no longer using  cpap   Stroke [facility])    per patient "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 Hulen Skains)   [place]     '93 for WPW syndrome   CARPAL TUNNEL RELEASE Right 12/21/2014   Procedure: RIGHT CARPAL TUNNEL RELEASE ;  Surgeon: Netta Cedars, MD;  Location: [place];  Service: Orthopedics;  Laterality: Right;   CARPAL TUNNEL RELEASE Left    CERVICAL SPINE SURGERY     cervical fusion with hardware retained   COLON SURGERY     "colon detached from abdomen"   COLONOSCOPY WITH PROPOFOL N/A 05/10/2014   Procedure: COLONOSCOPY WITH PROPOFOL;  Surgeon: Juanita Craver, MD;  Location: [facility];  Service: Endoscopy;  Laterality: N/A;  CYSTOSCOPY/RETROGRADE/URETEROSCOPY Left 12/18/2013   Procedure: CYSTOSCOPY/RETROGRADE/LEFT STENT;  Surgeon: Malka So, MD;  Location: [facility];  Service: Urology;  Laterality: Left;   ESOPHAGOGASTRODUODENOSCOPY (EGD) WITH PROPOFOL N/A 05/10/2014   Procedure: ESOPHAGOGASTRODUODENOSCOPY (EGD) WITH PROPOFOL;  Surgeon: Juanita Craver, MD;  [facility];  Service: Endoscopy;  Laterality: N/A;   EYELID CARCINOMA EXCISION     INSERTION OF DIALYSIS CATHETER N/A 08/16/2019   Procedure: INSERTION OF DIALYSIS CATHETER;  Surgeon: Garner Nash, DO;  Location: [place];  Service: Pulmonary;  Laterality: N/A;   JOINT REPLACEMENT     KNEE ARTHROSCOPY Left 07/16/2016   REVERSE SHOULDER ARTHROPLASTY Right 06/29/2014   Procedure: REVERSE TOTAL SHOULDER ARTHROPLASTY;  Surgeon: Netta Cedars, MD;  Location: [place];  Service:  Orthopedics;  Laterality: Right;   REVISION TOTAL SHOULDER TO REVERSE TOTAL SHOULDER Left 05/15/2015   REVISION TOTAL SHOULDER TO REVERSE TOTAL SHOULDER Left 05/15/2015   Procedure: LEFT SHOULDER REVISION TOTAL SHOULDER ARTHRPLASTY TO REVERSE TOTAL SHOULDER ARTHROPLASTY ;  Surgeon: Netta Cedars, MD;  Location: [place];  Service: Orthopedics;  Laterality: Left;   tear duct surgery     TOTAL ABDOMINAL HYSTERECTOMY     TOTAL HIP ARTHROPLASTY     x 3(rt x2, lt x1)   TOTAL KNEE ARTHROPLASTY Right 08/08/2013   Procedure: RIGHT TOTAL KNEE ARTHROPLASTY WITH SAPHENOUS NERVE SCAR EXCISION;  Surgeon: Mauri Pole, MD;  Location[facility];  Service: Orthopedics;  Laterality: Right;   TOTAL KNEE ARTHROPLASTY Left 08/25/2016   Procedure: LEFT TOTAL KNEE ARTHROPLASTY;  Surgeon: Paralee Cancel, MD;  [facility];  Service: Orthopedics;  Laterality: Left;  70 mins; Adductor block   TOTAL SHOULDER ARTHROPLASTY Left 07/15/2012   Procedure: LEFT TOTAL SHOULDER ARTHROPLASTY;  Surgeon: Augustin Schooling, MD;  Location: [place];  Service: Orthopedics;  Laterality: Left;   TOTAL SHOULDER REVISION Right 12/21/2014   Procedure: RIGHT SHOULDER POLY EXCHANGE ;  Surgeon: Netta Cedars, MD;  Location: [place];  Service: Orthopedics;  Laterality: Right;   URETEROSCOPY  stent placement    Current Meds  Medication Sig   AMITIZA 24 MCG capsule Take 24 mcg by mouth 2 (two) times daily.    carvedilol (COREG) 25 MG tablet Take 1 tablet (25 mg total) by mouth 2 (two) times daily.   citalopram (CELEXA) 40 MG tablet Take 40 mg by mouth daily.   furosemide (LASIX) 40 MG tablet Take 2 tablets (80 mg total) by mouth every other day.   hydrocortisone (CORTEF) 10 MG tablet Take 5-10 mg by mouth See admin instructions. Take '10mg'$  in the AM and '5mg'$  in the afternoon.   LORazepam (ATIVAN) 0.5 MG tablet TAKE 1 TABLET BY MOUTH EVERY 8 HOURS   Multiple Vitamin (MULTIVITAMIN ADULT PO) Take 1 tablet by mouth daily.   potassium chloride (KLOR-CON) 10 MEQ  tablet Take 10 mEq by mouth 2 (two) times daily.   pramipexole (MIRAPEX) 1 MG tablet TAKE 1 & 1/2 (ONE & ONE-HALF) TABLETS BY MOUTH AT BEDTIME   predniSONE (DELTASONE) 20 MG tablet Take 3 pills po the day before infusions.   promethazine (PHENERGAN) 25 MG tablet Take 1 tablet (25 mg total) by mouth every 6 (six) hours as needed for nausea or vomiting.   riTUXimab (RITUXAN IV) Inject into the vein.   telmisartan-hydrochlorothiazide (MICARDIS HCT) 80-12.5 MG tablet Take 1 tablet by mouth daily.   tiZANidine (ZANAFLEX) 4 MG tablet TAKE 1 TO 1 & 1/2 (ONE & ONE-HALF) TABLETS BY MOUTH THREE TIMES DAILY AS NEEDED  traMADol (ULTRAM) 50 MG tablet Take 1 tablet (50 mg total) by mouth every 8 (eight) hours.   [DISCONTINUED] furosemide (LASIX) 20 MG tablet Take 2 tablets by mouth daily.    Allergies  Allergen Reactions   Codeine Nausea And Vomiting    Can take if takes phernergan   Hydrocodone Nausea Only   Lamotrigine     "dizziness, shakiness and just feeling not right."   Robaxin [Methocarbamol] Rash      Review of Systems negative except from HPI and PMH  Physical Exam BP 134/74   Pulse 61   Ht '5\' 5"'$  (1.651 m)   Wt 287 lb (130.2 kg)   SpO2 97%   BMI 47.76 kg/m  Well developed and well nourished in no acute distress HENT normal E scleral and icterus clear Neck Supple JVP flat; carotids brisk and full Clear to ausculation Regular rate and rhythm, no murmurs gallops or rub Soft with active bowel sounds No clubbing cyanosis 1+ Edema Alert and oriented, grossly normal motor and sensory function Skin Warm and Dry  ECG sinus at 61 with PACs Intervals 14/10/41  CrCl cannot be calculated (Patient's most recent lab result is older than the maximum 21 days allowed.).   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  Atrial fibrillation is of short duration; she wears technology so if her heart rate is increased for more than 8 to 12 hours she will let us know and we will have to consider that point anticoagulation        Current medicines are reviewed at length with the patient today .  The patient does not   have concerns regarding medicines.

## 2021-07-03 NOTE — Patient Instructions (Signed)
Medication Instructions:  Your physician has recommended you make the following change in your medication:   **  Change Furosemide to '80mg'$  every other day.  *If you need a refill on your cardiac medications before your next appointment, please call your pharmacy*   Lab Work: None ordered.  If you have labs (blood work) drawn today and your tests are completely normal, you will receive your results only by: [place] (if you have MyChart) OR A paper copy in the mail If you have any lab test that is abnormal or we need to change your treatment, we will call you to review the results.   Testing/Procedures: Your physician has requested that you have a lexiscan myoview. For further information please visit [email or web address]. Please follow instruction sheet, as given.    Follow-Up: At [facility], you and your health needs are our priority.  As part of our continuing mission to provide you with exceptional heart care, we have created designated Provider Care Teams.  These Care Teams include your primary Cardiologist (physician) and Advanced Practice Providers (APPs -  Physician Assistants and Nurse Practitioners) who all work together to provide you with the care you need, when you need it.  We recommend signing up for the patient portal called "MyChart".  Sign up information is provided on this After Visit Summary.  MyChart is used to connect with patients for Virtual Visits (Telemedicine).  Patients are able to view lab/test results, encounter notes, upcoming appointments, etc.  Non-urgent messages can be sent to your provider as well.   To learn more about what you can do with MyChart, go to [email or web address].    Your next appointment:   12 months with Dr Caryl Comes  Important Information About Sugar

## 2021-07-16 ENCOUNTER — Telehealth (HOSPITAL_COMMUNITY): Payer: Self-pay | Admitting: *Deleted

## 2021-07-16 NOTE — Telephone Encounter (Signed)
Patient given detailed instructions per Myocardial Perfusion Study Information Sheet for the test on 07/23/21 at 0815. Patient notified to arrive 15 minutes early and that it is imperative to arrive on time for appointment to keep from having the test rescheduled.  If you need to cancel or reschedule your appointment, please call the office within 24 hours of your appointment. . Patient verbalized understanding.Connie Haynes, Ranae Palms

## 2021-07-23 ENCOUNTER — Ambulatory Visit (HOSPITAL_COMMUNITY): Payer: Medicare Other | Attending: Cardiovascular Disease

## 2021-07-23 DIAGNOSIS — R079 Chest pain, unspecified: Secondary | ICD-10-CM

## 2021-07-23 MED ORDER — TECHNETIUM TC 99M TETROFOSMIN IV KIT
30.7000 | PACK | Freq: Once | INTRAVENOUS | Status: AC | PRN
  Administered 2021-07-23: 30.7 via INTRAVENOUS

## 2021-07-23 MED ORDER — REGADENOSON 0.4 MG/5ML IV SOLN
0.4000 mg | Freq: Once | INTRAVENOUS | Status: AC
  Administered 2021-07-23: 0.4 mg via INTRAVENOUS

## 2021-07-24 ENCOUNTER — Ambulatory Visit (HOSPITAL_COMMUNITY): Payer: Medicare Other | Attending: Cardiovascular Disease

## 2021-07-24 LAB — MYOCARDIAL PERFUSION IMAGING
Base ST Depression (mm): 0 mm
LV dias vol: 83 mL (ref 46–106)
LV sys vol: 26 mL
Nuc Stress EF: 69 %
Peak HR: 79 {beats}/min
Rest HR: 54 {beats}/min
Rest Nuclear Isotope Dose: 32.3 mCi
SDS: 1
SRS: 0
SSS: 1
ST Depression (mm): 0 mm
Stress Nuclear Isotope Dose: 30.7 mCi
TID: 1.14

## 2021-07-24 MED ORDER — TECHNETIUM TC 99M TETROFOSMIN IV KIT
29.4000 | PACK | Freq: Once | INTRAVENOUS | Status: AC | PRN
  Administered 2021-07-24: 29.4 via INTRAVENOUS

## 2021-08-13 ENCOUNTER — Other Ambulatory Visit: Payer: Self-pay | Admitting: Neurology

## 2021-08-13 ENCOUNTER — Encounter: Payer: Self-pay | Admitting: Neurology

## 2021-08-13 ENCOUNTER — Other Ambulatory Visit: Payer: Self-pay | Admitting: *Deleted

## 2021-09-05 LAB — HM MAMMOGRAPHY

## 2021-09-16 ENCOUNTER — Other Ambulatory Visit: Payer: Self-pay | Admitting: *Deleted

## 2021-09-16 ENCOUNTER — Encounter: Payer: Self-pay | Admitting: Neurology

## 2021-09-16 DIAGNOSIS — G959 Disease of spinal cord, unspecified: Secondary | ICD-10-CM

## 2021-09-22 ENCOUNTER — Ambulatory Visit (INDEPENDENT_AMBULATORY_CARE_PROVIDER_SITE_OTHER): Payer: Medicare Other | Admitting: Neurology

## 2021-09-22 ENCOUNTER — Encounter: Payer: Self-pay | Admitting: Neurology

## 2021-09-22 VITALS — BP 143/77 | HR 61 | Ht 65.0 in | Wt 289.5 lb

## 2021-09-22 DIAGNOSIS — R531 Weakness: Secondary | ICD-10-CM

## 2021-09-22 DIAGNOSIS — G369 Acute disseminated demyelination, unspecified: Secondary | ICD-10-CM

## 2021-09-22 DIAGNOSIS — G959 Disease of spinal cord, unspecified: Secondary | ICD-10-CM | POA: Diagnosis not present

## 2021-09-22 DIAGNOSIS — R269 Unspecified abnormalities of gait and mobility: Secondary | ICD-10-CM | POA: Diagnosis not present

## 2021-09-22 NOTE — Progress Notes (Signed)
[facility]  PATIENT: CIERRIA Haynes DOB: 02-Jul-1955  REFERRING DOCTOR OR PCP: Elza Rafter, MD SOURCE: Patient, notes from recent hospitalization, imaging and laboratory reports, MRI images personally reviewed.  _________________________________   HISTORICAL  CHIEF COMPLAINT:  Chief Complaint  Patient presents with   Follow-up    Rm 2, alone. Pt here to f/u for increased leg weakness/fatigue. Per pt she has had whole body weakness. Has trouble walking. Has had several falls but has not to get seen for any.     HISTORY OF PRESENT ILLNESS:  Connie Haynes is a 66 y.o. 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  She had been stable and tolerates the infusion well.   However, over the last month, she notes her gait is doing worse    She has had a few falls due to both balance and strength.   Her legs are giving out, especially the left leg.  She is unable to lift herself up if she is on the ground.    She has had borderlie anemia and has low Vit D - she is taking iron and, B12 and Vit D.    Next Rituxan dose is October 2023. . Due to itching, she is taking prednisone the day befpre the infusion now with benefit.    Her LETM and left ON occurred within a few days.  The vision change noticed shortly after presenting to the hospital for her leg/groin numbness,    She has chronic very reduced sensation below her knee and some left numbness o hip.  Arms are not numb.   . She feels weak in legs.   Due to the numness, she needs to see her feet as she walks - making using a walker more difficult.       PT has helped some in past but she needed to stop due to feeling weaker.   She will be released shortly.      She has dysesthesias on legs but gabapentin made her feel very strange.     She has a left altitudinal visual field cut (confirmed on recent [facility] VF testing with ophth).  She  feels that this is stable  Bladder issues continue.   She will feel some pressure when the bladder is full but not every time.   Similar with bowel.   Scheduled urination and defecation may help some.  Hesitancy is a problem.     She does not think she has had UTI.     Repeat spine imaging MRIs 07/23/2020 compared them to the previous ones.  The MRIs look better showing no new lesions and fairly complete resolution of the enhancing focus that had extended from the mid cervical spine to the upper thoracic spine.  She has had adrenal insufficiency dx in 2018 and is on Cortef.    She has more neck pain since almost falling and catching self.   Orthopedics would like to do a myelogram (as she has ACDF artifact on MRI.   She is concerend due to h/o mild CRF (creatinine is 1.06 now)   She received the Covid 19 vaccination [employer]) February 1, February 22, Oct 01 2019.     LETM/NMOSD HISTORY In late June 2021, she had the onset of urinary retention and the next morning had numbness from the waist down.   She initially presented to the ED but left due to  the long wait.   She re-presented to [facility] emergency room when she had worsening with more intense numbness, weakness and reduced gait.  MRIs showed a longitudinal enhancing lesion in the lower cervical and upper thoracic spine (C4-T4) and the brain showed 2 foci, one that enhanced near the basal ganglia on the right.    She was admitted for further evaluation and treatment.  She had high dose steroids for 5 days but did not note a benefit.  She then had 5 treatments of plasma exchange..    Strength improved 08/22/2019 and she was able to take a few steps.   She was doing some physical therapy while in the hospital.  By the time of discharge, she was able to use a walker.  She improved further over the next month but continued to have numbness and reduced gait.      We received the results of the [facility] anti-NMO and anti-MOG test.  Both were negative.   MRIs showed a longitudinal enhancing lesion in the lower cervical and upper thoracic spine (C4-T4) and the brain showed 2 foci, one that enhanced near the basal ganglia on the right.  We had discussed that, despite that result, I am very concerned that she is at an elevated risk of a relapse.  Besides the inflammatory transverse myelitis, she also had an enhancing lesion in the brain.  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  Other tests including vasculitis labs were negative though CNS vasculitis would not always show up in blood work.  She has adrenal insufficiency and is on Cortef.  She also has stage 2 CKD, HTN, WPW (S/p ablation).  I personally reviewed the MRIs of the lumbar spine, thoracic spine, cervical spine and brain performed 08/10/2019 and 08/11/2019.  She had increased numbness in legs 07/2020.     Imaging review: MRI brain 08/11/2019: There is a 9 mm enhancing focus in the inferior right frontal lobe associated with some mass-effect.  Additionally, there is a small left T2/flair hyperintense focus just above the left internal capsule.  The etiology of the focus lesion is uncertain.  It could represent a focus of demyelination.  Neoplasm cannot be completely ruled out.  The more chronic appearing focus could be demyelinating or ischemic  MRI of the cervical spine and thoracic spine 08/10/2019: Central to posterior enhancing focus within the spinal cord from C4-T4.  This causes some enlargement of the cord.  Additionally there is ACDF at C5-C7 and DJD at C3-C4 and C4-C5.  MRI of the lumbar spine 08/10/2019: There is anterolisthesis of L3 upon L4 associated with facet hypertrophy and protrusion of the uncovered disc.  There is foraminal narrowing, more on the right.  Milder degenerative changes are noted at some of the lumbar levels but no spinal stenosis or significant nerve root compression.  MRI of the cervical and thoracic spine 01/10/2020 showed greatly improved appearance of the  abnormal signal.  On this MRI, there is just minimal T2 hyperintensity adjacent to C6-C7 and mild abnormal enhancement posteriorly adjacent to C7.  MRI of the brain 01/10/2020 showed interval improvement of the enhancing focus in the inferior right frontal lobe that have been seen on the 08/11/2019 MRI.  There was still a small focus of enhancement in that region.  Several other T2/FLAIR hyperintense foci in the hemispheres are more consistent with minimal stable chronic microvascular ischemic change.  MRI of the cervical and thoracic spine 07/23/2020 showed complete resolution of the enhancing  focus from C4-T4 that has been seen on previous MRIs.   However, there appears to be mild atrophy at C7-T1.    Laboratory review While in the hospital 08/2019, she had a LP (CSF was normal except slightly elevated protein); NMO Ab (negative), anti-MOG (negative).  Additionally she had ANCA, HIV, TB, B12, rheumatoid factor, anti-Jo 1, ENA, HSV, ACE.  These were all negative or normal.    July 2021:  Hep B cAb, Hep B sAg, Hep B sAb all negative.    ANA was negative  Repeat [facility] anti-Mog and anti-NMO were negative 09/2019   REVIEW OF SYSTEMS: Constitutional: No fevers, chills, sweats, or change in appetite Eyes: No visual changes, double vision, eye pain Ear, nose and throat: No hearing loss, ear pain, nasal congestion, sore throat Cardiovascular: No chest pain, palpitations Respiratory:  No shortness of breath at rest or with exertion.   No wheezes GastrointestinaI: No nausea, vomiting, diarrhea, abdominal pain, fecal incontinence Genitourinary:  No dysuria, urinary retention or frequency.  No nocturia. Musculoskeletal:  No neck pain, back pain Integumentary: No rash, pruritus, skin lesions Neurological: as above Psychiatric: No depression at this time.  No anxiety Endocrine: No palpitations, diaphoresis, change in appetite, change in weigh or increased thirst Hematologic/Lymphatic:  No anemia, purpura,  petechiae. Allergic/Immunologic: No itchy/runny eyes, nasal congestion, recent allergic reactions, rashes  ALLERGIES: Allergies  Allergen Reactions   Codeine Nausea And Vomiting    Can take if takes phernergan   Hydrocodone Nausea Only   Lamotrigine     "dizziness, shakiness and just feeling not right."   Robaxin [Methocarbamol] Rash    HOME MEDICATIONS:  Current Outpatient Medications:    AMITIZA 24 MCG capsule, Take 24 mcg by mouth 2 (two) times daily. , Disp: , Rfl:    carvedilol (COREG) 25 MG tablet, Take 1 tablet (25 mg total) by mouth 2 (two) times daily., Disp: 180 tablet, Rfl: 3   citalopram (CELEXA) 40 MG tablet, Take 40 mg by mouth daily., Disp: , Rfl:    furosemide (LASIX) 40 MG tablet, Take 2 tablets (80 mg total) by mouth every other day., Disp: 90 tablet, Rfl: 3   hydrocortisone (CORTEF) 10 MG tablet, Take 5-10 mg by mouth See admin instructions. Take '10mg'$  in the AM and '5mg'$  in the afternoon., Disp: , Rfl:    LORazepam (ATIVAN) 0.5 MG tablet, TAKE 1 TABLET BY MOUTH EVERY 8 HOURS, Disp: 30 tablet, Rfl: 5   Multiple Vitamin (MULTIVITAMIN ADULT PO), Take 1 tablet by mouth daily., Disp: , Rfl:    potassium chloride (KLOR-CON) 10 MEQ tablet, Take 10 mEq by mouth 2 (two) times daily., Disp: , Rfl:    pramipexole (MIRAPEX) 1 MG tablet, TAKE 1 & 1/2 (ONE & ONE-HALF) TABLETS BY MOUTH AT BEDTIME, Disp: 45 tablet, Rfl: 5   predniSONE (DELTASONE) 20 MG tablet, Take 3 pills po the day before infusions., Disp: 12 tablet, Rfl: 0   promethazine (PHENERGAN) 25 MG tablet, Take 1 tablet (25 mg total) by mouth every 6 (six) hours as needed for nausea or vomiting., Disp: 60 tablet, Rfl: 0   riTUXimab (RITUXAN IV), Inject into the vein., Disp: , Rfl:    telmisartan-hydrochlorothiazide (MICARDIS HCT) 80-12.5 MG tablet, Take 1 tablet by mouth daily., Disp: , Rfl:    tiZANidine (ZANAFLEX) 4 MG tablet, TAKE 1 TO 1 & 1/2 (ONE & ONE-HALF) TABLETS BY MOUTH THREE TIMES DAILY AS NEEDED, Disp: 270 tablet,  Rfl: 1   traMADol (ULTRAM) 50 MG tablet, Take 1  tablet (50 mg total) by mouth every 8 (eight) hours., Disp: 90 tablet, Rfl: 1 No current facility-administered medications for this visit.  Facility-Administered Medications Ordered in Other Visits:    chlorhexidine (HIBICLENS) 4 % liquid 4 application, 60 mL, Topical, Once, Dixon, Robb Matar, PA-C   technetium tetrofosmin (TC-MYOVIEW) injection 31.7 milli Curie, 17.6 millicurie, Intravenous, Once PRN, Dorothy Spark, MD  PAST MEDICAL HISTORY: Past Medical History:  Diagnosis Date   Adrenal insufficiency ([place])    Anemia    denies   Anxiety    Basal cell carcinoma    eye lid   Chronic kidney disease, stage 3 (HCC)    Cough    hx. dry cough chronic   Disorder of tear duct system    frequent tearing of eyes   GERD (gastroesophageal reflux disease)    H/O: whooping cough 2000   History of kidney infection 2015   ? sepsis   History of kidney stones    History of kidney stones    Hyperlipidemia    Hypertension    IBS (irritable bowel syndrome)    controlled with med   Osteoarthritis    arthritis -joints-limited ROM shoulders more on right   PONV (postoperative nausea and vomiting)     per pt hx of low BP following shoulder surgery in 05-2015; was managed in hospital with IV fluids and d/c BP being taken a tthe time; pt was dced and F/U with endcrinoopgist who dx with adrenal insurffiency and started steroid therapy; treatmetnt with positive outome, now managed chronically on hydrocortisone tablets;   Sleep apnea    no longer using cpap   Stroke [facility])    per patient "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 Hulen Skains)   [place]     '93 for WPW syndrome   CARPAL TUNNEL RELEASE Right 12/21/2014   Procedure: RIGHT CARPAL TUNNEL RELEASE ;  Surgeon: Netta Cedars, MD;  Location: [place];  Service: Orthopedics;  Laterality: Right;   CARPAL TUNNEL RELEASE Left    CERVICAL SPINE SURGERY     cervical fusion with hardware retained   COLON SURGERY     "colon detached from abdomen"   COLONOSCOPY WITH PROPOFOL N/A 05/10/2014   Procedure: COLONOSCOPY WITH PROPOFOL;  Surgeon: Juanita Craver, MD;  Location[facility];  Service: Endoscopy;  Laterality: N/A;   CYSTOSCOPY/RETROGRADE/URETEROSCOPY Left 12/18/2013   Procedure: CYSTOSCOPY/RETROGRADE/LEFT STENT;  Surgeon: Malka So, MD;  Location: [facility];  Service: Urology;  Laterality: Left;   ESOPHAGOGASTRODUODENOSCOPY (EGD) WITH PROPOFOL N/A 05/10/2014   Procedure: ESOPHAGOGASTRODUODENOSCOPY (EGD) WITH PROPOFOL;  Surgeon: Juanita Craver, MD;  [facility];  Service: Endoscopy;  Laterality: N/A;   EYELID CARCINOMA EXCISION     INSERTION OF DIALYSIS CATHETER N/A 08/16/2019   Procedure: INSERTION OF DIALYSIS CATHETER;  Surgeon: Garner Nash, DO;  Location: [place];  Service: Pulmonary;  Laterality: N/A;   JOINT REPLACEMENT     KNEE ARTHROSCOPY Left 07/16/2016   REVERSE SHOULDER ARTHROPLASTY  Right 06/29/2014   Procedure: REVERSE TOTAL SHOULDER ARTHROPLASTY;  Surgeon: Netta Cedars, MD;  Location: [place];  Service: Orthopedics;  Laterality: Right;   REVISION TOTAL SHOULDER TO REVERSE TOTAL SHOULDER Left 05/15/2015   REVISION TOTAL SHOULDER TO REVERSE TOTAL SHOULDER Left 05/15/2015   Procedure: LEFT SHOULDER REVISION TOTAL SHOULDER ARTHRPLASTY TO REVERSE TOTAL SHOULDER ARTHROPLASTY ;  Surgeon: Netta Cedars, MD;  Location: [place];  Service: Orthopedics;  Laterality: Left;   tear duct surgery      TOTAL ABDOMINAL HYSTERECTOMY     TOTAL HIP ARTHROPLASTY     x 3(rt x2, lt x1)   TOTAL KNEE ARTHROPLASTY Right 08/08/2013   Procedure: RIGHT TOTAL KNEE ARTHROPLASTY WITH SAPHENOUS NERVE SCAR EXCISION;  Surgeon: Mauri Pole, MD;  [facility];  Service: Orthopedics;  Laterality: Right;   TOTAL KNEE ARTHROPLASTY Left 08/25/2016   Procedure: LEFT TOTAL KNEE ARTHROPLASTY;  Surgeon: Paralee Cancel, MD;  [facility];  Service: Orthopedics;  Laterality: Left;  70 mins; Adductor block   TOTAL SHOULDER ARTHROPLASTY Left 07/15/2012   Procedure: LEFT TOTAL SHOULDER ARTHROPLASTY;  Surgeon: Augustin Schooling, MD;  Location: [place];  Service: Orthopedics;  Laterality: Left;   TOTAL SHOULDER REVISION Right 12/21/2014   Procedure: RIGHT SHOULDER POLY EXCHANGE ;  Surgeon: Netta Cedars, MD;  Location: [place];  Service: Orthopedics;  Laterality: Right;   URETEROSCOPY  stent placement    FAMILY HISTORY: Family History  Problem Relation Age of Onset   Leukemia Brother    Heart disease Mother    Hypertension Mother    Hyperlipidemia Mother    Heart attack Mother    Heart failure Mother    Heart disease Maternal Grandfather    Heart attack Maternal Grandfather    Cancer Paternal Grandfather    Lung cancer Paternal Grandfather    Cancer Maternal Uncle        bone    SOCIAL HISTORY:  Social History   Socioeconomic History   Marital status: Married    Spouse name: Public house manager   Number of children: 0   Years of education: Not on file   Highest education level: Not on file  Occupational History   Occupation: Retired     Fish farm manager: [employer]  Tobacco Use   Smoking status: Never   Smokeless tobacco: Never  Vaping Use   Vaping Use: Never used  Substance and Sexual Activity   Alcohol use: No    Alcohol/week: 0.0 standard drinks of alcohol   Drug use: No   Sexual activity: Not on file  Other Topics Concern   Not on file  Social History Narrative   Marital Status:  Married Lobbyist)    Living Situation: Lives with spouse   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   Tobacco Use/Exposure:  None   Alcohol Use: None   Drug Use:  None   Diet:  Low Fat/Low Carb   Exercise:  3-4 Days per week   Hobbies:  Reading, Travel   Left handed    Caffeine: 1 cup coffee every morning, diet coke (rare)   Social Determinants of Health   Financial Resource Strain: Not on file  Food Insecurity: Not on file  Transportation Needs: Not on file  Physical Activity: Not on file  Stress: Not on file  Social Connections: Not on file  Intimate Partner Violence: Not on file     PHYSICAL EXAM  Vitals:   09/22/21 0915  BP: (!) 143/77  Pulse: 61  Weight: 289 lb 8 oz (131.3 kg)  Haynes: '5\' 5"'$  (1.651 m)    Body mass index is 48.18 kg/m.   General: The patient is well-developed and well-nourished and in no acute distress  HEENT:  Head is [place]/AT.  Sclera are anicteric.   Neck: No carotid bruits are noted.  The neck is tender in lower cervical paraspinal muscles  Skin: Extremities are without rash or edema.  Neurologic Exam  Mental status: The patient is alert and oriented x 3 at the time of the examination. The patient has apparent normal recent and remote memory, with an apparently normal attention span and concentration ability.   Speech is normal.  Cranial nerves: Extraocular movements are full.  Color vision is normal and symmetric today.  VA is better OD than OS but she can still read large print  Facial symmetry is present. There is good facial sensation to soft touch bilaterally.Facial strength is normal.  Trapezius and sternocleidomastoid strength is normal. No dysarthria is noted.   No obvious hearing deficits are noted.  Motor:  Muscle bulk is normal.   Tone is normal.  Strength is 4+/5 right and 4/5 left deltoid and 4-/5 left triceps and biceps, , 3 to 4 -/5 in proximal legs, 4/5 with quadriceps and 4 -/5 in the feet and ankles  Sensory: She has  absent vibration sensation in the legs and near absent touch/ temperaturein legs and lower flanks to chest, left worse than right in trunk.   Slight reduced vibration sensation in 5th fingers relative to 2nd fingers.    Coordination: Cerebellar testing reveals good finger-nose-finger.  She has a poor heel-to-shin.    Gait and station: She is unable to stand up from the chair without using her arms for support.  She is walking without a cane or walker but is very ataxic.  She cannot tandem.  Romberg positive  Reflexes: Deep tendon reflexes are symmetric and normal in arms, cross adductors at the knees.  No clonus.Marland Kitchen        DIAGNOSTIC DATA (LABS, IMAGING, TESTING) - I reviewed patient records, labs, notes, testing and imaging myself where available.  Lab Results  Component Value Date   WBC 7.2 05/02/2020   HGB 12.3 05/02/2020   HCT 37.9 05/02/2020   MCV 91 05/02/2020   PLT 275 05/02/2020      Component Value Date/Time   NA 140 11/06/2020 1039   K 3.8 11/06/2020 1039   CL 100 11/06/2020 1039   CO2 23 11/06/2020 1039   GLUCOSE 99 11/06/2020 1039   GLUCOSE 110 (H) 08/24/2019 0417   BUN 23 11/06/2020 1039   CREATININE 0.95 11/06/2020 1039   CREATININE 0.87 06/18/2015 1207   CALCIUM 10.1 11/06/2020 1039   PROT 4.7 (L) 08/23/2019 0416   ALBUMIN 3.6 08/23/2019 0416   ALBUMIN 4.0 08/10/2019 2356   AST 14 (L) 08/23/2019 0416   ALT 12 08/23/2019 0416   ALKPHOS 20 (L) 08/23/2019 0416   BILITOT 0.6 08/23/2019 0416   GFRNONAA >60 08/24/2019 0417   GFRAA >60 08/24/2019 0417   Lab Results  Component Value Date   CHOL 79 08/17/2019   HDL 22 (L) 08/17/2019   LDLCALC 29 08/17/2019   TRIG 141 08/17/2019   CHOLHDL 3.6 08/17/2019   No results found for: "HGBA1C" Lab Results  Component Value Date   VITAMINB12 376 08/11/2019   Lab Results  Component Value Date   TSH 3.119 08/11/2019       ASSESSMENT  AND PLAN  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HCC) - Plan: MR BRAIN W WO CONTRAST  Gait  disturbance - Plan: MR BRAIN W WO CONTRAST, Myasthenia Gravis Profile, CK  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[place]) - Plan: MR BRAIN W WO CONTRAST, IgG, IgA, IgM, Anti-MOG, Serum, Neuromyelitis optica autoab, IgG  Weakness - Plan: Myasthenia Gravis Profile, CK   1.   She has worsened again, especially with gait.   She has more proximal weakness than last visit as well.  .Leg numbness bout the same   Need to determine if (1) new demeiniation/myelopathy or (2) other disorder such as myopathy or MG.   Will check MRI and labs    3 days of 1 g IV Solumedrol. 2,    Continue Rituxan for nw.  Check IgG and IgM were mildly reduced.  If there is breakthrough activity we will need to consider a different disease modifying therapy or the addition of prednisone.   3.   Try to resume PT    .   4.   Return in 5-6 months or sooner if there are new or worsening neurologic symptoms.     45-minute office visit with the majority of the time spent face-to-face for history and physical, discussion/counseling and decision-making.  Additional time with record review and documentation.   Neima Lacross A. Felecia Shelling, MD, [facility] 3/43/5686, 1:68 PM Certified in Neurology, Clinical Neurophysiology, Sleep Medicine and Neuroimaging  [facility] [address] [place], [place] [address] [phone]

## 2021-09-24 ENCOUNTER — Encounter: Payer: Self-pay | Admitting: Neurology

## 2021-09-24 ENCOUNTER — Telehealth: Payer: Self-pay | Admitting: Neurology

## 2021-09-24 NOTE — Telephone Encounter (Signed)
[place] FVOH:[id number] exp. 09/24/21-10/23/21 sent to GI

## 2021-09-26 DIAGNOSIS — R944 Abnormal results of kidney function studies: Secondary | ICD-10-CM | POA: Insufficient documentation

## 2021-09-30 ENCOUNTER — Ambulatory Visit
Admission: RE | Admit: 2021-09-30 | Discharge: 2021-09-30 | Disposition: A | Discharge status: Home / Self Care | Payer: Medicare Other | Source: Ambulatory Visit | Attending: Neurology | Admitting: Neurology

## 2021-09-30 DIAGNOSIS — G959 Disease of spinal cord, unspecified: Secondary | ICD-10-CM | POA: Diagnosis not present

## 2021-09-30 MED ORDER — GADOBENATE DIMEGLUMINE 529 MG/ML IV SOLN
20.0000 mL | Freq: Once | INTRAVENOUS | Status: AC | PRN
  Administered 2021-09-30: 20 mL via INTRAVENOUS

## 2021-10-01 ENCOUNTER — Encounter: Payer: Self-pay | Admitting: Neurology

## 2021-10-05 ENCOUNTER — Ambulatory Visit
Admission: RE | Admit: 2021-10-05 | Discharge: 2021-10-05 | Disposition: A | Discharge status: Home / Self Care | Payer: Medicare Other | Source: Ambulatory Visit | Attending: Neurology | Admitting: Neurology

## 2021-10-05 DIAGNOSIS — R269 Unspecified abnormalities of gait and mobility: Secondary | ICD-10-CM

## 2021-10-05 DIAGNOSIS — G369 Acute disseminated demyelination, unspecified: Secondary | ICD-10-CM | POA: Diagnosis not present

## 2021-10-05 DIAGNOSIS — G959 Disease of spinal cord, unspecified: Secondary | ICD-10-CM

## 2021-10-05 MED ORDER — GADOBENATE DIMEGLUMINE 529 MG/ML IV SOLN
20.0000 mL | Freq: Once | INTRAVENOUS | Status: AC | PRN
  Administered 2021-10-05: 20 mL via INTRAVENOUS

## 2021-10-06 LAB — IGG, IGA, IGM
IgA/Immunoglobulin A, Serum: 70 mg/dL — ABNORMAL LOW (ref 87–352)
IgG (Immunoglobin G), Serum: 456 mg/dL — ABNORMAL LOW (ref 586–1602)
IgM (Immunoglobulin M), Srm: 10 mg/dL — ABNORMAL LOW (ref 26–217)

## 2021-10-06 LAB — NEUROMYELITIS OPTICA AUTOAB, IGG: NMO IgG Autoantibodies: <1.5 U/mL (ref 0.0–3.0)

## 2021-10-06 LAB — MYASTHENIA GRAVIS PROFILE
AChR Binding Ab, Serum: <0.03 nmol/L (ref 0.00–0.24)
AChR-modulating Ab: 0 % (ref 0–45)
Acetylchol Block Ab: 7 % (ref 0–25)
Anti-striation Abs: NEGATIVE

## 2021-10-06 LAB — CK: Total CK: 59 U/L (ref 32–182)

## 2021-10-06 LAB — ANTI-MOG, SERUM: MOG Antibody, Cell-based IFA: NEGATIVE

## 2021-10-06 LAB — MUSK ANTIBODIES: MuSK Antibodies: <1 U/mL

## 2021-10-08 ENCOUNTER — Encounter: Payer: Self-pay | Admitting: Neurology

## 2021-10-12 ENCOUNTER — Other Ambulatory Visit: Payer: Self-pay | Admitting: Student

## 2021-10-14 ENCOUNTER — Other Ambulatory Visit: Payer: Self-pay

## 2021-10-14 DIAGNOSIS — R1013 Epigastric pain: Secondary | ICD-10-CM | POA: Insufficient documentation

## 2021-10-14 DIAGNOSIS — K219 Gastro-esophageal reflux disease without esophagitis: Secondary | ICD-10-CM | POA: Insufficient documentation

## 2021-10-20 ENCOUNTER — Other Ambulatory Visit: Payer: Self-pay | Admitting: Neurology

## 2021-10-22 ENCOUNTER — Encounter: Payer: Self-pay | Admitting: Neurology

## 2021-11-04 ENCOUNTER — Ambulatory Visit: Payer: Medicare Other | Admitting: Neurology

## 2021-11-11 ENCOUNTER — Other Ambulatory Visit: Payer: Self-pay | Admitting: Neurology

## 2021-11-12 MED ORDER — PRAMIPEXOLE DIHYDROCHLORIDE 1 MG PO TABS
1.0000 mg | ORAL_TABLET | Freq: Once | ORAL | 0 refills | Status: DC
Start: 2021-11-12 — End: 2021-12-02

## 2021-12-01 ENCOUNTER — Encounter: Payer: Self-pay | Admitting: Neurology

## 2021-12-02 ENCOUNTER — Other Ambulatory Visit: Payer: Self-pay | Admitting: Neurology

## 2021-12-05 ENCOUNTER — Other Ambulatory Visit: Payer: Self-pay | Admitting: Neurology

## 2021-12-08 ENCOUNTER — Encounter: Payer: Self-pay | Admitting: Neurology

## 2021-12-08 ENCOUNTER — Other Ambulatory Visit: Payer: Self-pay | Admitting: *Deleted

## 2021-12-08 MED ORDER — PRAMIPEXOLE DIHYDROCHLORIDE 1 MG PO TABS: ORAL_TABLET | ORAL | 5 refills | Status: DC

## 2021-12-29 ENCOUNTER — Encounter: Payer: Self-pay | Admitting: Neurology

## 2021-12-30 ENCOUNTER — Other Ambulatory Visit: Payer: Self-pay | Admitting: Neurology

## 2021-12-30 ENCOUNTER — Other Ambulatory Visit: Payer: Self-pay | Admitting: *Deleted

## 2021-12-30 MED ORDER — TIZANIDINE HCL 4 MG PO TABS: ORAL_TABLET | ORAL | 1 refills | Status: DC

## 2022-01-21 ENCOUNTER — Other Ambulatory Visit: Payer: Self-pay | Admitting: Neurology

## 2022-02-18 ENCOUNTER — Encounter: Payer: Self-pay | Admitting: Neurology

## 2022-02-18 ENCOUNTER — Other Ambulatory Visit: Payer: Self-pay | Admitting: Neurology

## 2022-03-17 ENCOUNTER — Other Ambulatory Visit: Payer: Self-pay | Admitting: Neurology

## 2022-03-17 NOTE — Telephone Encounter (Addendum)
Follow up scheduled on 04/29/22 Last filled on 02/18/22 # 30 tablet  Rx pending to be signed

## 2022-03-17 NOTE — Telephone Encounter (Signed)
Refill request already sent to Dr.Sater on 06/15/23 this is a duplicate request.

## 2022-03-18 ENCOUNTER — Encounter: Payer: Self-pay | Admitting: Neurology

## 2022-03-29 IMAGING — MR MR LUMBAR SPINE W/O CM
4 of 5 series · 28 of 48 positions shown · non-contrast
Comparison: None.

CLINICAL DATA: Low back pain, lower extremity numbness, urinary
problems

EXAM:
MRI THORACIC AND LUMBAR SPINE WITHOUT CONTRAST
TECHNIQUE: Multiplanar and multiecho pulse sequences of the thoracic and lumbar
spine were obtained without intravenous contrast.

[Series 2: T1 · sagittal · 4.0mm · 0.88mm/px · 8 of 17 slices shown (1 of 2)]
[im 1/17]
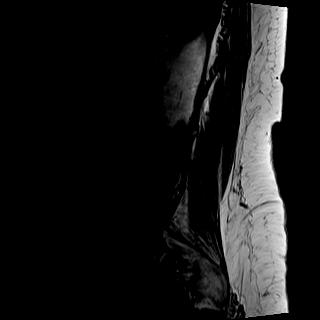
[im 3/17]
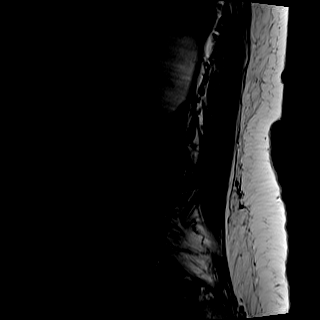
[im 5/17]
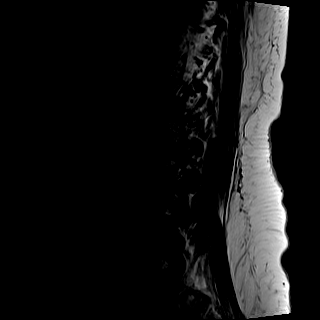
[im 7/17]
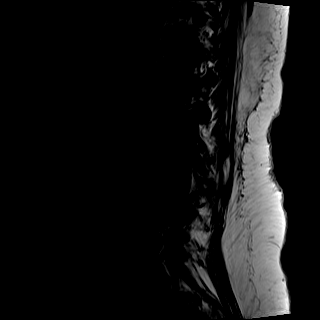
[im 10/17]
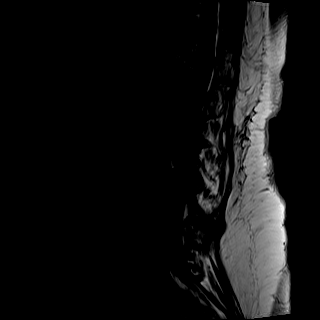
[im 12/17]
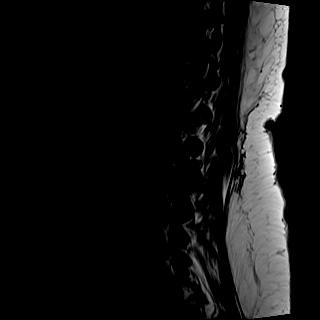
[im 14/17]
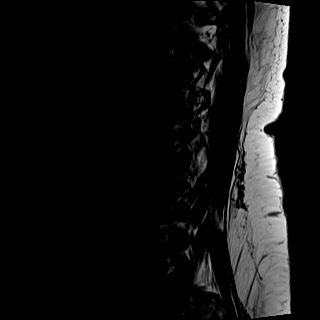
[im 17/17]
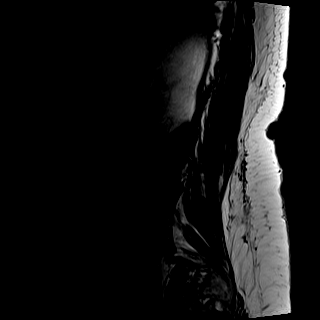

[Series 3: T2 · sagittal · 4.0mm · 0.73mm/px · 8 of 17 slices shown (1 of 2)]
[im 1/17]
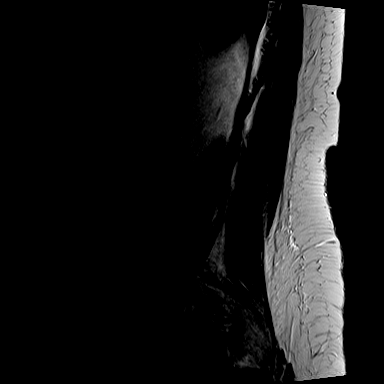
[im 3/17]
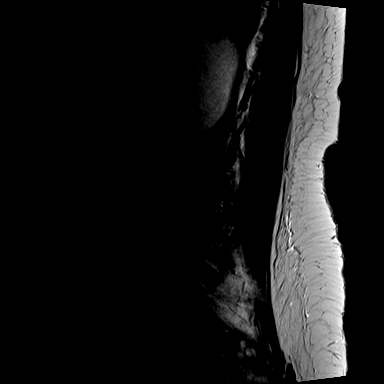
[im 5/17]
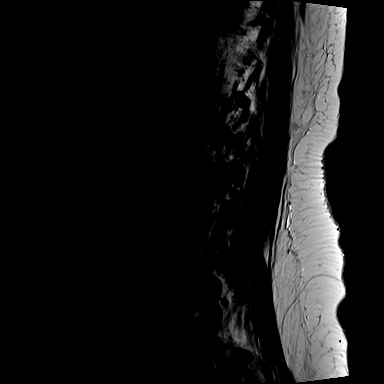
[im 7/17]
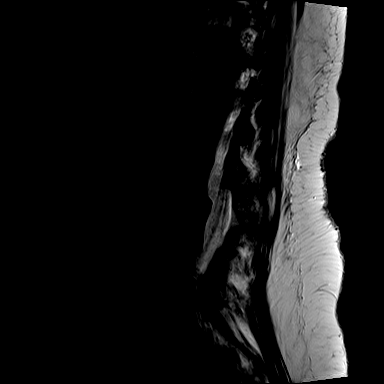
[im 10/17]
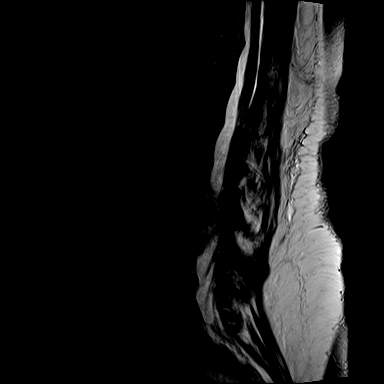
[im 12/17]
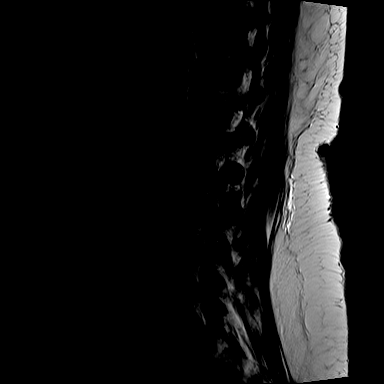
[im 14/17]
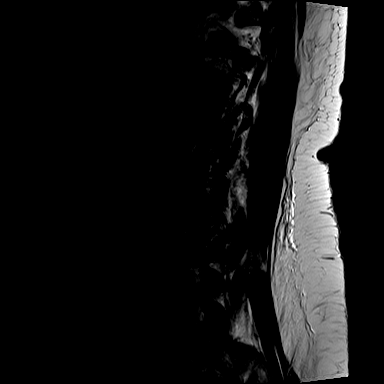
[im 17/17]
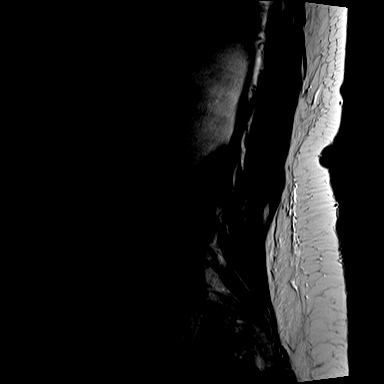

[Series 4: T2 · axial · 5.0mm · 0.57mm/px · z∈[-420,-233]mm · 9 of 24 slices shown (2 of 2)]
[im 1/24]
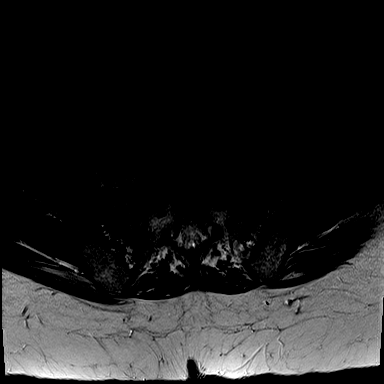
[im 5/24]
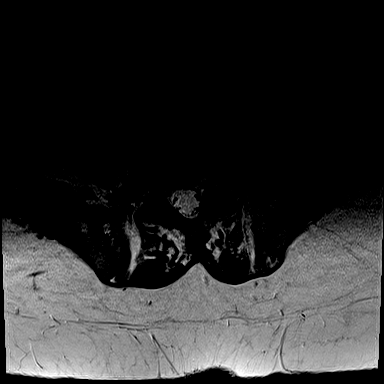
[im 7/24]
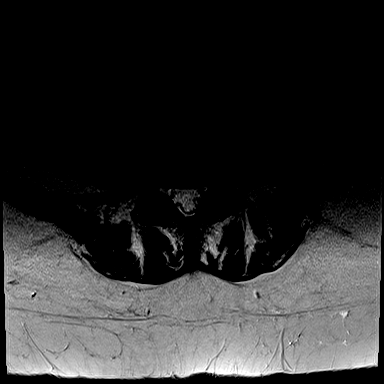
[im 11/24]
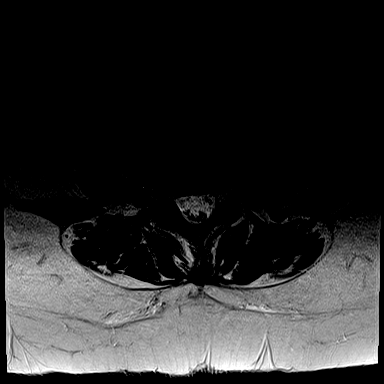
[im 13/24]
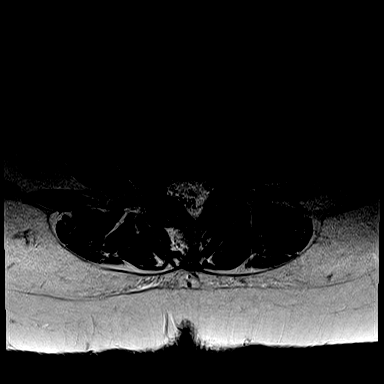
[im 17/24]
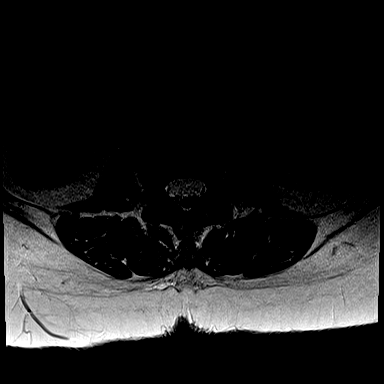
[im 19/24]
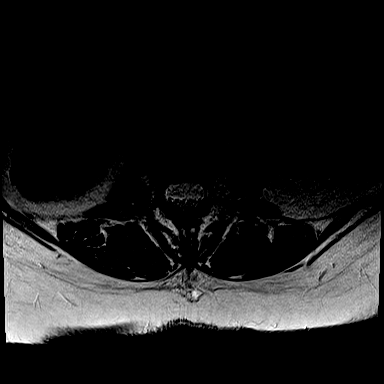
[im 21/24]
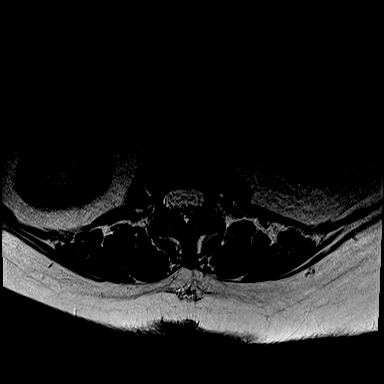
[im 24/24]
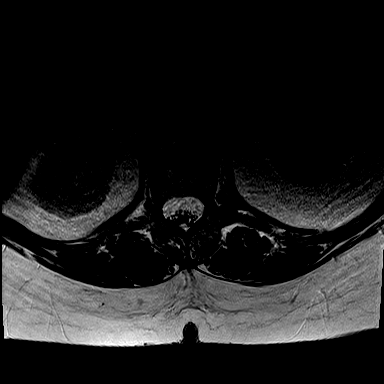

[Series 5: T1 · axial · 5.0mm · 0.34mm/px · z∈[-390,-256]mm · 3 of 24 slices shown (2 of 2)]
[im 5/24]
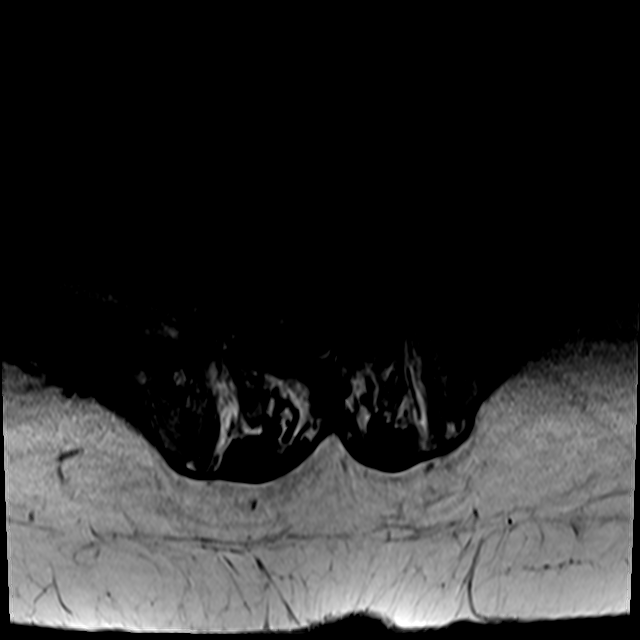
[im 13/24]
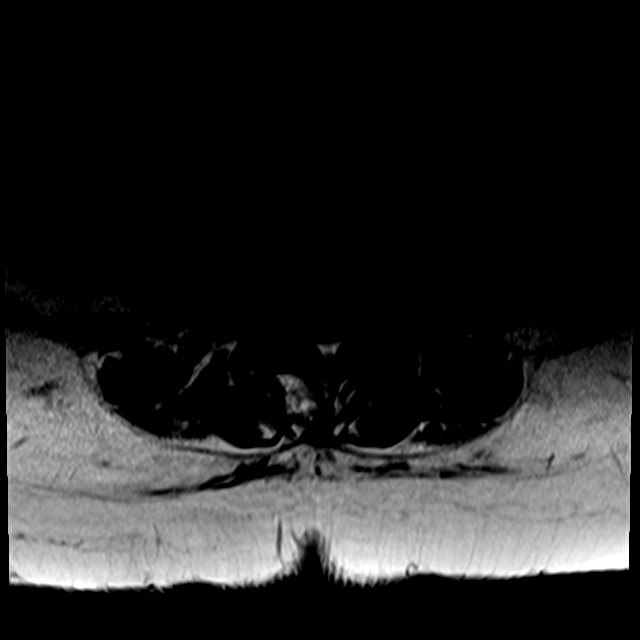
[im 21/24]
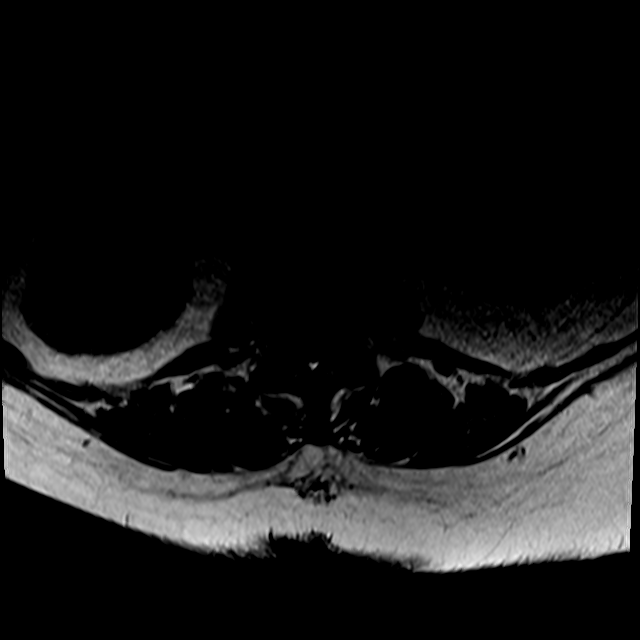

[28 of 48 positions shown; findings below may reference images not displayed]

FINDINGS: MRI THORACIC SPINE

Motion artifact is present

Alignment:  Anteroposterior alignment is maintained.

Vertebrae: Vertebral body heights are preserved. There is a probable
vertebral body hemangioma at T4. There is no significant marrow
edema. No suspicious osseous lesion.

Cord: There is abnormal cord signal extending superiorly is from the
T3-T4 level.

Paraspinal and other soft tissues: Unremarkable.

Disc levels:

Mild degenerative changes are present. For example, there are minor
disc bulges at T2-T3 and T11-T12. There is mild to moderate facet
arthropathy. No high-grade canal or neural foraminal narrowing
identified.

MRI LUMBAR SPINE

Motion artifact is present.

Segmentation: Counting from above, there are 6 lumbar type vertebral
bodies.

Alignment: There is grade 1 anterolisthesis at L3-L4. Mild
retrolisthesis is present at L4-L5.

Vertebrae: Vertebral body heights are maintained apart from
degenerative endplate irregularity primarily at L3-L4. Probable
small hemangioma at L1. There is no significant marrow edema or
suspicious osseous lesion.

Conus medullaris and cauda equina: Conus extends to the L1-L2 level.
Conus and cauda equina appear normal.

Paraspinal and other soft tissues: Unremarkable.

Disc levels:

L1-L2:  No canal or foraminal stenosis.

L2-L3:  Facet arthropathy.  No canal or foraminal stenosis

L3-L4: Anterolisthesis with uncovering of disc bulge. Superimposed
right foraminal protrusion. Facet arthropathy. No canal stenosis.
Moderate right foraminal stenosis. No left foraminal stenosis.

L4-L5: Disc bulge. Facet arthropathy. No canal or left foraminal
stenosis. Mild right foraminal stenosis.

L5-L6: Disc bulge eccentric to the left. Facet arthropathy. No canal
or right foraminal stenosis. Mild to moderate left foraminal
stenosis.

L6-S1: No canal or foraminal stenosis.
IMPRESSION: Motion artifact is present.

Abnormal cord signal extending superiorly from the T3-T4 level into
the cervical spine. Recommend contrast enhanced MRI of the cervical
spine.

Multilevel degenerative changes as detailed above. No high-grade
canal stenosis. Right foraminal protrusion at L3-L4 causes moderate
stenosis.

These results were called by telephone at the time of interpretation
on 08/10/2019 at [DATE]

## 2022-04-07 ENCOUNTER — Other Ambulatory Visit: Payer: Self-pay | Admitting: Student

## 2022-04-07 ENCOUNTER — Encounter: Payer: Self-pay | Admitting: Neurology

## 2022-04-17 ENCOUNTER — Other Ambulatory Visit: Payer: Self-pay | Admitting: Neurology

## 2022-04-20 NOTE — Telephone Encounter (Signed)
Pt last seen on 09/22/21 Follow up scheduled on 04/29/22 Last filled on 03/17/22 # 30 (10 day supply) Rx pending to be signed

## 2022-04-27 ENCOUNTER — Telehealth: Payer: Self-pay | Admitting: Neurology

## 2022-04-27 DIAGNOSIS — R531 Weakness: Secondary | ICD-10-CM

## 2022-04-27 DIAGNOSIS — R269 Unspecified abnormalities of gait and mobility: Secondary | ICD-10-CM

## 2022-04-27 DIAGNOSIS — M542 Cervicalgia: Secondary | ICD-10-CM

## 2022-04-27 DIAGNOSIS — G959 Disease of spinal cord, unspecified: Secondary | ICD-10-CM

## 2022-04-27 NOTE — Telephone Encounter (Signed)
Ok to place referral? Last seen 09/22/21 and next f/u 04/29/22.

## 2022-04-27 NOTE — Addendum Note (Signed)
Addended by: Wyvonnia Lora on: 04/27/2022 04:55 PM   Modules accepted: Orders

## 2022-04-27 NOTE — Telephone Encounter (Signed)
Jeneen Rinks, PT w/ [facility] [phone] with secure vm if he needs to be called) he would like pt to go to [facility] [id number] ph [phone] fax [phone] ) he is wanting pt to see Darden Amber, PT which is familiar with pt history. Jeneen Rinks is asking to be called once this has been processed

## 2022-04-28 ENCOUNTER — Telehealth: Payer: Self-pay | Admitting: Neurology

## 2022-04-28 NOTE — Telephone Encounter (Signed)
Referral for physical therapy fax to [facility]. Phone: [phone], Fax: [phone].

## 2022-04-29 ENCOUNTER — Ambulatory Visit (INDEPENDENT_AMBULATORY_CARE_PROVIDER_SITE_OTHER): Payer: Medicare Other | Admitting: Neurology

## 2022-04-29 ENCOUNTER — Encounter: Payer: Self-pay | Admitting: Neurology

## 2022-04-29 VITALS — BP 133/77 | HR 58 | Ht 65.0 in | Wt 290.0 lb

## 2022-04-29 DIAGNOSIS — Z79899 Other long term (current) drug therapy: Secondary | ICD-10-CM

## 2022-04-29 DIAGNOSIS — G4733 Obstructive sleep apnea (adult) (pediatric): Secondary | ICD-10-CM

## 2022-04-29 DIAGNOSIS — G369 Acute disseminated demyelination, unspecified: Secondary | ICD-10-CM | POA: Diagnosis not present

## 2022-04-29 DIAGNOSIS — R339 Retention of urine, unspecified: Secondary | ICD-10-CM | POA: Diagnosis not present

## 2022-04-29 DIAGNOSIS — E271 Primary adrenocortical insufficiency: Secondary | ICD-10-CM

## 2022-04-29 DIAGNOSIS — Z5181 Encounter for therapeutic drug level monitoring: Secondary | ICD-10-CM

## 2022-04-29 MED ORDER — SULFAMETHOXAZOLE-TRIMETHOPRIM 800-160 MG PO TABS: 1.0000 | ORAL_TABLET | Freq: Two times a day (BID) | ORAL | 0 refills | Status: DC

## 2022-04-29 NOTE — Progress Notes (Signed)
[facility]  PATIENT: Connie Haynes DOB: 02-Jan-1956  REFERRING DOCTOR OR PCP: Elza Rafter, MD SOURCE: Patient, notes from recent hospitalization, imaging and laboratory reports, MRI images personally reviewed.  _________________________________   HISTORICAL  CHIEF COMPLAINT:  Chief Complaint  Patient presents with   Follow-up    Rm 2, alone. Pt here to f/u for increased leg weakness/fatigue. Per pt she has had whole body weakness. Has trouble walking. Has had several falls but has not to get seen for any.     HISTORY OF PRESENT ILLNESS:  Connie Haynes is a 67 y.o. 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  She had been stable and tolerates the infusion well.  Due to low IgG and IgM, we had her do 1000 mg x 1 rather than 2 back in October.   Around the time of the last visit there was concern of exacerbation and we checked imaging studies.  The transverse myelitis focus visible on MRIs.  The demyelinating focus in the brain is reduced in size.  She notes more numbness and sometimes left leg weakness that seems worse towards the end of each cycle.    She does PT.     She has had a few falls due to both balance and strength.   Her legs are giving out, especially the left leg.  She has trouble moving fast or turning.   She is unable to lift herself up if she is on the ground.      Next Rituxan dose is May 21, 2022. . Due to itching, she is taking prednisone the day befpre the infusion now with benefit.    Her LETM and left ON occurred within a few days.  Additionally, she had a demyelinating focus within the brain.  The vision change noticed shortly after presenting to the hospital for her leg/groin numbness,     She had a Covid vaccination 4 months earlier but nothing within a month  She has absent sensation below the hips.Arms are not numb.   . She feels weak in legs.   Due to  the numness, she needs to see her feet as she walks     She has dysesthesias on legs but gabapentin made her feel very strange.     She has a left altitudinal visual field cut (confirmed on recent [facility]rey VF testing with ophth).  She feels that this is stable  Bladder issues continue.   She will feel some pressure when the bladder is full but not every time.   Similar with bowel.   Scheduled urination and defecation may help some.  Hesitancy is a problem.     She does not think she has had UTI.     Repeat spine imaging MRIs 07/23/2020 compared them to the previous ones.  The MRIs look better showing no new lesions and fairly complete resolution of the enhancing focus that had extended from the mid cervical spine to the upper thoracic spine.  She has had adrenal insufficiency dx in 2018 and is on Cortef.    She has more neck pain since almost falling and catching self.   Orthopedics would like to do a myelogram (as she has ACDF artifact on MRI.   She is concerend due to h/o mild CRF (creatinine is 1.06 now)   LETM/NMOSD HISTORY In late June 2021, she had the onset of urinary retention and  the next morning had numbness from the waist down.   She initially presented to the ED but left due to the long wait.   She re-presented to [facility] emergency room when she had worsening with more intense numbness, weakness and reduced gait.  MRIs showed a longitudinal enhancing lesion in the lower cervical and upper thoracic spine (C4-T4) and the brain showed 2 foci, one that enhanced near the basal ganglia on the right.    She was admitted for further evaluation and treatment.  She had high dose steroids for 5 days but did not note a benefit.  She then had 5 treatments of plasma exchange..    Strength improved 08/22/2019 and she was able to take a few steps.   She was doing some physical therapy while in the hospital.  By the time of discharge, she was able to use a walker.  She improved further over the next month  but continued to have numbness and reduced gait.      We received the results of the [facility] anti-NMO and anti-MOG test.  Both were negative.  MRIs showed a longitudinal enhancing lesion in the lower cervical and upper thoracic spine (C4-T4) and the brain showed 2 foci, one that enhanced near the basal ganglia on the right.  We had discussed that, despite that result, I am very concerned that she is at an elevated risk of a relapse.  Besides the inflammatory transverse myelitis, she also had an enhancing lesion in the brain.  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  Other tests including vasculitis labs were negative though CNS vasculitis would not always show up in blood work.  She has adrenal insufficiency and is on Cortef.  She also has stage 2 CKD, HTN, WPW (S/p ablation).  I personally reviewed the MRIs of the lumbar spine, thoracic spine, cervical spine and brain performed 08/10/2019 and 08/11/2019.  She had increased numbness in legs 07/2020.     Imaging review: MRI brain 08/11/2019: There is a 9 mm enhancing focus in the inferior right frontal lobe associated with some mass-effect.  Additionally, there is a small left T2/flair hyperintense focus just above the left internal capsule.  The etiology of the focus lesion is uncertain.  It could represent a focus of demyelination.  Neoplasm cannot be completely ruled out.  The more chronic appearing focus could be demyelinating or ischemic  MRI of the cervical spine and thoracic spine 08/10/2019: Central to posterior enhancing focus within the spinal cord from C4-T4.  This causes some enlargement of the cord.  Additionally there is ACDF at C5-C7 and DJD at C3-C4 and C4-C5.  MRI of the lumbar spine 08/10/2019: There is anterolisthesis of L3 upon L4 associated with facet hypertrophy and protrusion of the uncovered disc.  There is foraminal narrowing, more on the right.  Milder degenerative changes are noted at some of the lumbar levels but no  spinal stenosis or significant nerve root compression.  MRI of the cervical and thoracic spine 01/10/2020 showed greatly improved appearance of the abnormal signal.  On this MRI, there is just minimal T2 hyperintensity adjacent to C6-C7 and mild abnormal enhancement posteriorly adjacent to C7.  MRI of the brain 01/10/2020 showed interval improvement of the enhancing focus in the inferior right frontal lobe that have been seen on the 08/11/2019 MRI.  There was still a small focus of enhancement in that region.  Several other T2/FLAIR hyperintense foci in the hemispheres are more consistent with  minimal stable chronic microvascular ischemic change.  MRI of the cervical and thoracic spine 07/23/2020 showed complete resolution of the enhancing focus from C4-T4 that has been seen on previous MRIs.   However, there appears to be mild atrophy at C7-T1.    MRI of the brain 10/05/2021 showed a right inferior frontal/medial temporal focus that is hyperintense on T2 weighted and FLAIR images consistent with remote demyelination.  In 2021, this focus was larger, associated with edema and enhanced.  It no longer is associated with edema or enhances.   Remote lacunar infarction adjacent to the basal ganglia on the left, unchanged compared to the previous MRI.  Few scattered T2/FLAIR hyperintense foci in the hemispheres consistent with minimal age appropriate chronic microvascular ischemic changes.  MRI of the cervical and thoracic spine 822/2023 showed resolution of the C4-T4 lesion noted on the previous MRIs.  Laboratory review While in the hospital 08/2019, she had a LP (CSF was normal except slightly elevated protein); NMO Ab (negative), anti-MOG (negative).  Additionally she had ANCA, HIV, TB, B12, rheumatoid factor, anti-Jo 1, ENA, HSV, ACE.  These were all negative or normal.    July 2021:  Hep B cAb, Hep B sAg, Hep B sAb all negative.    ANA was negative  Repeat [facility] anti-Mog and anti-NMO were negative  09/2019   REVIEW OF SYSTEMS: Constitutional: No fevers, chills, sweats, or change in appetite Eyes: No visual changes, double vision, eye pain Ear, nose and throat: No hearing loss, ear pain, nasal congestion, sore throat Cardiovascular: No chest pain, palpitations Respiratory:  No shortness of breath at rest or with exertion.   No wheezes GastrointestinaI: No nausea, vomiting, diarrhea, abdominal pain, fecal incontinence Genitourinary:  No dysuria, urinary retention or frequency.  No nocturia. Musculoskeletal:  No neck pain, back pain Integumentary: No rash, pruritus, skin lesions Neurological: as above Psychiatric: No depression at this time.  No anxiety Endocrine: No palpitations, diaphoresis, change in appetite, change in weigh or increased thirst Hematologic/Lymphatic:  No anemia, purpura, petechiae. Allergic/Immunologic: No itchy/runny eyes, nasal congestion, recent allergic reactions, rashes  ALLERGIES: Allergies  Allergen Reactions   Codeine Nausea And Vomiting    Can take if takes phernergan   Hydrocodone Nausea Only   Lamotrigine     "dizziness, shakiness and just feeling not right."   Robaxin [Methocarbamol] Rash    HOME MEDICATIONS:  Current Outpatient Medications:    AMITIZA 24 MCG capsule, Take 24 mcg by mouth 2 (two) times daily. , Disp: , Rfl:    carvedilol (COREG) 25 MG tablet, Take 1 tablet (25 mg total) by mouth 2 (two) times daily., Disp: 180 tablet, Rfl: 3   citalopram (CELEXA) 40 MG tablet, Take 40 mg by mouth daily., Disp: , Rfl:    furosemide (LASIX) 40 MG tablet, Take 2 tablets (80 mg total) by mouth every other day., Disp: 90 tablet, Rfl: 3   hydrocortisone (CORTEF) 10 MG tablet, Take 5-10 mg by mouth See admin instructions. Take 10mg  in the AM and 5mg  in the afternoon., Disp: , Rfl:    LORazepam (ATIVAN) 0.5 MG tablet, TAKE 1 TABLET BY MOUTH EVERY 8 HOURS, Disp: 30 tablet, Rfl: 5   Multiple Vitamin (MULTIVITAMIN ADULT PO), Take 1 tablet by mouth  daily., Disp: , Rfl:    potassium chloride (KLOR-CON) 10 MEQ tablet, Take 10 mEq by mouth 2 (two) times daily., Disp: , Rfl:    pramipexole (MIRAPEX) 1 MG tablet, TAKE 1 & 1/2 (ONE & ONE-HALF) TABLETS BY MOUTH  AT BEDTIME, Disp: 45 tablet, Rfl: 5   predniSONE (DELTASONE) 20 MG tablet, Take 3 pills po the day before infusions., Disp: 12 tablet, Rfl: 0   promethazine (PHENERGAN) 25 MG tablet, Take 1 tablet (25 mg total) by mouth every 6 (six) hours as needed for nausea or vomiting., Disp: 60 tablet, Rfl: 0   riTUXimab (RITUXAN IV), Inject into the vein., Disp: , Rfl:    telmisartan-hydrochlorothiazide (MICARDIS HCT) 80-12.5 MG tablet, Take 1 tablet by mouth daily., Disp: , Rfl:    tiZANidine (ZANAFLEX) 4 MG tablet, TAKE 1 TO 1 & 1/2 (ONE & ONE-HALF) TABLETS BY MOUTH THREE TIMES DAILY AS NEEDED, Disp: 270 tablet, Rfl: 1   traMADol (ULTRAM) 50 MG tablet, Take 1 tablet (50 mg total) by mouth every 8 (eight) hours., Disp: 90 tablet, Rfl: 1 No current facility-administered medications for this visit.  Facility-Administered Medications Ordered in Other Visits:    chlorhexidine (HIBICLENS) 4 % liquid 4 application, 60 mL, Topical, Once, Dixon, Robb Matar, PA-C   technetium tetrofosmin (TC-MYOVIEW) injection 31.7 milli Curie, [id number] millicurie, Intravenous, Once PRN, Dorothy Spark, MD  PAST MEDICAL HISTORY: Past Medical History:  Diagnosis Date   Adrenal insufficiency ([place])    Anemia    denies   Anxiety    Basal cell carcinoma    eye lid   Chronic kidney disease, stage 3 (HCC)    Cough    hx. dry cough chronic   Disorder of tear duct system    frequent tearing of eyes   GERD (gastroesophageal reflux disease)    H/O: whooping cough 2000   History of kidney infection 2015   ? sepsis   History of kidney stones    History of kidney stones    Hyperlipidemia    Hypertension    IBS (irritable bowel syndrome)    controlled with med   Osteoarthritis    arthritis -joints-limited ROM  shoulders more on right   PONV (postoperative nausea and vomiting)     per pt hx of low BP following shoulder surgery in 05-2015; was managed in hospital with IV fluids and d/c BP being taken a tthe time; pt was dced and F/U with endcrinoopgist who dx with adrenal insurffiency and started steroid therapy; treatmetnt with positive outome, now managed chronically on hydrocortisone tablets;   Sleep apnea    no longer using cpap   Stroke [facility])    per patient "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 Hulen Skains)   [place]     '93 for WPW syndrome   CARPAL TUNNEL RELEASE Right 12/21/2014   Procedure: RIGHT CARPAL TUNNEL RELEASE ;  Surgeon: Netta Cedars, MD;  Location: [place];  Service: Orthopedics;  Laterality: Right;   CARPAL TUNNEL RELEASE Left    CERVICAL SPINE SURGERY     cervical fusion with hardware retained   COLON SURGERY     "colon detached from abdomen"   COLONOSCOPY WITH PROPOFOL N/A 05/10/2014   Procedure: COLONOSCOPY WITH PROPOFOL;  Surgeon: Juanita Craver, MD;  Location: [facility];  Service: Endoscopy;  Laterality: N/A;   CYSTOSCOPY/RETROGRADE/URETEROSCOPY Left 12/18/2013   Procedure: CYSTOSCOPY/RETROGRADE/LEFT STENT;  Surgeon: Malka So, MD;  Location: [facility];  Service: Urology;  Laterality: Left;   ESOPHAGOGASTRODUODENOSCOPY (EGD) WITH PROPOFOL N/A 05/10/2014   Procedure: ESOPHAGOGASTRODUODENOSCOPY (EGD) WITH PROPOFOL;  Surgeon: Juanita Craver, MD;  [facility];  Service: Endoscopy;  Laterality:  N/A;   EYELID CARCINOMA EXCISION     INSERTION OF DIALYSIS CATHETER N/A 08/16/2019   Procedure: INSERTION OF DIALYSIS CATHETER;  Surgeon: Garner Nash, DO;  Location: [place];  Service: Pulmonary;  Laterality: N/A;   JOINT REPLACEMENT     KNEE ARTHROSCOPY Left 07/16/2016   REVERSE SHOULDER ARTHROPLASTY Right 06/29/2014   Procedure: REVERSE TOTAL SHOULDER ARTHROPLASTY;  Surgeon: Netta Cedars, MD;  Location: [place];  Service: Orthopedics;  Laterality: Right;   REVISION TOTAL SHOULDER TO REVERSE TOTAL SHOULDER Left 05/15/2015   REVISION TOTAL SHOULDER TO REVERSE TOTAL SHOULDER Left 05/15/2015   Procedure: LEFT SHOULDER REVISION TOTAL SHOULDER ARTHRPLASTY TO REVERSE TOTAL SHOULDER ARTHROPLASTY ;  Surgeon: Netta Cedars, MD;  Location: [place];  Service: Orthopedics;  Laterality: Left;   tear duct surgery     TOTAL ABDOMINAL HYSTERECTOMY     TOTAL HIP ARTHROPLASTY     x 3(rt x2, lt x1)   TOTAL KNEE ARTHROPLASTY Right 08/08/2013   Procedure: RIGHT TOTAL KNEE ARTHROPLASTY WITH SAPHENOUS NERVE SCAR EXCISION;  Surgeon: Mauri Pole, MD;  [facility];  Service: Orthopedics;  Laterality: Right;   TOTAL KNEE ARTHROPLASTY Left 08/25/2016   Procedure: LEFT TOTAL KNEE ARTHROPLASTY;  Surgeon: Paralee Cancel, MD;  [facility];  Service: Orthopedics;  Laterality: Left;  70 mins; Adductor block   TOTAL SHOULDER ARTHROPLASTY Left 07/15/2012   Procedure: LEFT TOTAL SHOULDER ARTHROPLASTY;  Surgeon: Augustin Schooling, MD;  Location: [place];  Service: Orthopedics;  Laterality: Left;   TOTAL SHOULDER REVISION Right 12/21/2014   Procedure: RIGHT SHOULDER POLY EXCHANGE ;  Surgeon: Netta Cedars, MD;  Location: [place];  Service: Orthopedics;  Laterality: Right;   URETEROSCOPY  stent placement    FAMILY HISTORY: Family History  Problem Relation Age of Onset   Leukemia Brother    Heart disease Mother    Hypertension Mother    Hyperlipidemia Mother    Heart attack Mother    Heart failure Mother    Heart  disease Maternal Grandfather    Heart attack Maternal Grandfather    Cancer Paternal Grandfather    Lung cancer Paternal Grandfather    Cancer Maternal Uncle        bone    SOCIAL HISTORY:  Social History   Socioeconomic History   Marital status: Married    Spouse name: Public house manager   Number of children: 0   Years of education: Not on file   Highest education level: Not on file  Occupational History   Occupation: Retired     Fish farm manager: [employer]  Tobacco Use   Smoking status: Never   Smokeless tobacco: Never  Vaping Use   Vaping Use: Never used  Substance and Sexual Activity   Alcohol use: No    Alcohol/week: 0.0 standard drinks of alcohol   Drug use: No   Sexual activity: Not on file  Other Topics Concern   Not on file  Social History Narrative   Marital Status:  Married Lobbyist)   Living Situation: Lives with spouse   Occupation: Optometrist   Education:  2 years of college   Tobacco Use/Exposure:  None   Alcohol Use: None   Drug Use:  None   Diet:  Low Fat/Low Carb   Exercise:  3-4 Days per week   Hobbies:  Reading, Travel   Left handed    Caffeine: 1 cup coffee every morning, diet coke (rare)   Social Determinants of Health   Financial Resource Strain: Not on file  Food Insecurity: Not on file  Transportation Needs: Not on file  Physical Activity: Not on file  Stress: Not on file  Social Connections: Not on file  Intimate Partner Violence: Not on file     PHYSICAL EXAM  Vitals:   09/22/21 0915  BP: (!) 143/77  Pulse: 61  Weight: 289 lb 8 oz (131.3 kg)  Height: 5\' 5"  (1.651 m)    Body mass index is 48.18 kg/m.   General: The patient is well-developed and well-nourished and in no acute distress  HEENT:  Head is [place]/AT.  Sclera are anicteric.   Neck: No carotid bruits are noted.  The neck is tender in lower cervical paraspinal muscles  Skin: Extremities are without rash or edema.  Neurologic Exam  Mental status: The  patient is alert and oriented x 3 at the time of the examination. The patient has apparent normal recent and remote memory, with an apparently normal attention span and concentration ability.   Speech is normal.  Cranial nerves: Extraocular movements are full.  Color vision is normal and symmetric today.  VA is better OD than OS but she can still read large print  Facial symmetry is present. There is good facial sensation to soft touch bilaterally.Facial strength is normal.  Trapezius and sternocleidomastoid strength is normal. No dysarthria is noted.   No obvious hearing deficits are noted.  Motor:  Muscle bulk is normal.   Tone is normal.  Strength is 4+/5 right and 4/5 left deltoid and 4-/5 left triceps and biceps, , 3 to 4 -/5 in proximal legs, 4/5 with quadriceps and 4 -/5 in the feet and ankles  Sensory: She has absent vibration sensation in the legs and near absent touch/ temperaturein legs and lower flanks to lower chest, left worse than right in trunk.   Slight reduced vibration sensation in 5th fingers relative to 2nd fingers.    Coordination: Cerebellar testing reveals good finger-nose-finger.  She has a poor heel-to-shin.    Gait and station: She is unable to stand up from the chair without using her arms for support.  She is walking without a cane or walker but is very ataxic.  She is unable to tandem walk.  The Romberg is good.    Reflexes: Deep tendon reflexes are symmetric and normal in arms, cross adductors at the knees.  No clonus.Marland Kitchen        DIAGNOSTIC DATA (LABS, IMAGING, TESTING) - I reviewed patient records, labs, notes, testing and imaging myself where available.  Lab Results  Component Value Date   WBC 7.2 05/02/2020   HGB 12.3 05/02/2020   HCT 37.9 05/02/2020   MCV 91 05/02/2020   PLT 275 05/02/2020      Component Value Date/Time   NA 140 11/06/2020 1039   K 3.8 11/06/2020 1039   CL 100 11/06/2020 1039   CO2 23 11/06/2020 1039   GLUCOSE 99 11/06/2020 1039    GLUCOSE 110 (H) 08/24/2019 0417   BUN 23 11/06/2020 1039   CREATININE 0.95 11/06/2020 1039   CREATININE 0.87 06/18/2015 1207  CALCIUM 10.1 11/06/2020 1039   PROT 4.7 (L) 08/23/2019 0416   ALBUMIN 3.6 08/23/2019 0416   ALBUMIN 4.0 08/10/2019 2356   AST 14 (L) 08/23/2019 0416   ALT 12 08/23/2019 0416   ALKPHOS 20 (L) 08/23/2019 0416   BILITOT 0.6 08/23/2019 0416   GFRNONAA >60 08/24/2019 0417   GFRAA >60 08/24/2019 0417   Lab Results  Component Value Date   CHOL 79 08/17/2019   HDL 22 (L) 08/17/2019   LDLCALC 29 08/17/2019   TRIG 141 08/17/2019   CHOLHDL 3.6 08/17/2019   No results found for: "HGBA1C" Lab Results  Component Value Date   VITAMINB12 376 08/11/2019   Lab Results  Component Value Date   TSH 3.119 08/11/2019       ASSESSMENT AND PLAN  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HCC) - Plan: IgG, IgA, IgM, Neuromyelitis optica autoab, IgG, CD20 B Cells  High risk medication use - Plan: IgG, IgA, IgM, Neuromyelitis optica autoab, IgG, CD20 B Cells  Urinary retention - Plan: Urinalysis, Culture, Urine  Adrenal insufficiency (Addison's disease) (HCC)  OSA (obstructive sleep apnea)  Encounter for monitoring immunomodulating therapy    1.   Continue Rituxan for now at 1000 mg q6 months.  Check IgG and IgM ad CD20.  If B cell count is not <2% of lymphocytes, we will need to make infusions more frequently  2.   UA and culture 3.   Try to resume PT    .   4.   Return in 5-6 months or sooner if there are new or worsening neurologic symptoms.     40-minute office visit with the majority of the time spent face-to-face for history and physical, discussion/counseling and decision-making.  Additional time with record review and documentation.   Riyanna Crutchley A. Felecia Shelling, MD, [facility] [id number], [id number] PM Certified in Neurology, Clinical Neurophysiology, Sleep Medicine and Neuroimaging  [facility] [address], [place], [place] [address] [phone]

## 2022-04-30 LAB — URINALYSIS
Bilirubin, UA: NEGATIVE
Glucose, UA: NEGATIVE
Ketones, UA: NEGATIVE
Nitrite, UA: NEGATIVE
Protein,UA: NEGATIVE
RBC, UA: NEGATIVE
Specific Gravity, UA: 1.011 (ref 1.005–1.030)
Urobilinogen, Ur: 0.2 mg/dL (ref 0.2–1.0)
pH, UA: 6 (ref 5.0–7.5)

## 2022-05-01 LAB — URINE CULTURE

## 2022-05-02 LAB — IGG, IGA, IGM
IgA/Immunoglobulin A, Serum: 75 mg/dL — ABNORMAL LOW (ref 87–352)
IgG (Immunoglobin G), Serum: 457 mg/dL — ABNORMAL LOW (ref 586–1602)
IgM (Immunoglobulin M), Srm: 10 mg/dL — ABNORMAL LOW (ref 26–217)

## 2022-05-02 LAB — CD20 B CELLS
% CD19-B Cells: 0 % — ABNORMAL LOW (ref 4.6–22.1)
% CD20-B Cells: 0 % — ABNORMAL LOW (ref 5.0–22.3)

## 2022-05-02 LAB — NEUROMYELITIS OPTICA AUTOAB, IGG: NMO IgG Autoantibodies: <1.5 U/mL (ref 0.0–3.0)

## 2022-05-15 ENCOUNTER — Other Ambulatory Visit: Payer: Self-pay | Admitting: Neurology

## 2022-05-18 ENCOUNTER — Encounter: Payer: Self-pay | Admitting: Neurology

## 2022-05-18 ENCOUNTER — Other Ambulatory Visit: Payer: Self-pay | Admitting: Neurology

## 2022-05-18 DIAGNOSIS — R052 Subacute cough: Secondary | ICD-10-CM

## 2022-05-18 DIAGNOSIS — Z7961 Long term (current) use of immunomodulator: Secondary | ICD-10-CM | POA: Insufficient documentation

## 2022-05-19 ENCOUNTER — Telehealth: Payer: Self-pay | Admitting: *Deleted

## 2022-05-19 ENCOUNTER — Other Ambulatory Visit: Payer: Self-pay | Admitting: Neurology

## 2022-05-19 ENCOUNTER — Telehealth: Payer: Self-pay | Admitting: Neurology

## 2022-05-19 ENCOUNTER — Ambulatory Visit
Admission: RE | Admit: 2022-05-19 | Discharge: 2022-05-19 | Disposition: A | Discharge status: Home / Self Care | Payer: Medicare Other | Source: Ambulatory Visit | Attending: Neurology | Admitting: Neurology

## 2022-05-19 DIAGNOSIS — Z7961 Long term (current) use of immunomodulator: Secondary | ICD-10-CM

## 2022-05-19 DIAGNOSIS — R052 Subacute cough: Secondary | ICD-10-CM

## 2022-05-19 MED ORDER — DOXYCYCLINE HYCLATE 100 MG PO CAPS: 100.0000 mg | ORAL_CAPSULE | Freq: Two times a day (BID) | ORAL | 0 refills | Status: DC

## 2022-05-19 MED ORDER — DOXYCYCLINE HYCLATE 100 MG PO TBEC: 100.0000 mg | DELAYED_RELEASE_TABLET | Freq: Two times a day (BID) | ORAL | 0 refills | Status: DC

## 2022-05-19 NOTE — Telephone Encounter (Signed)
I called patient and informed her that capsules for doxy has been sent to pharmacy. I told her okay to keep scheduled Rituxan infusion on 05/22/22 @ 8 am as long is she does not have a fever. Pt verbalized she understood.

## 2022-05-19 NOTE — Telephone Encounter (Signed)
-----   Message from Asa Lente, MD sent at 05/19/2022 10:38 AM EDT ----- Please let her know that there was some change on the x-ray in the bottom of the left lung.  I would like her to see her primary care about this to determine if she needs to be on antibiotics.

## 2022-05-19 NOTE — Telephone Encounter (Signed)
Pt informed with below, pt said she doesn't have PCP provider right now. She is trying to get established with a new provider. She asked if you would be willing to send in Rx? I told her she may try a [facility],however pt declined states every time she goes to urgent care they want to send her to the hospital due to her health history.    She also asked does she keep her infusion scheduled for this week @ 8am?  Please advise

## 2022-05-19 NOTE — Telephone Encounter (Signed)
Connie Haynes called from [facility]. Requesting prescription doxycycline (DORYX) 100 MG EC tablet be changed to capsules because of pt insurance.

## 2022-05-21 ENCOUNTER — Other Ambulatory Visit: Payer: Self-pay

## 2022-05-21 ENCOUNTER — Other Ambulatory Visit: Payer: Self-pay | Admitting: *Deleted

## 2022-05-21 DIAGNOSIS — G369 Acute disseminated demyelination, unspecified: Secondary | ICD-10-CM

## 2022-05-21 DIAGNOSIS — Z79899 Other long term (current) drug therapy: Secondary | ICD-10-CM

## 2022-05-22 LAB — HEPATITIS B SURFACE ANTIBODY,QUALITATIVE: Hep B Surface Ab, Qual: NONREACTIVE

## 2022-05-22 LAB — HEPATITIS B CORE ANTIBODY, TOTAL: Hep B Core Total Ab: NEGATIVE

## 2022-05-22 LAB — HEPATITIS B SURFACE ANTIGEN: Hepatitis B Surface Ag: NEGATIVE

## 2022-06-04 ENCOUNTER — Encounter: Payer: Self-pay | Admitting: Family

## 2022-06-04 ENCOUNTER — Ambulatory Visit (HOSPITAL_BASED_OUTPATIENT_CLINIC_OR_DEPARTMENT_OTHER)
Admission: RE | Admit: 2022-06-04 | Discharge: 2022-06-04 | Disposition: A | Discharge status: Home / Self Care | Payer: Medicare Other | Source: Ambulatory Visit | Attending: Family | Admitting: Family

## 2022-06-04 ENCOUNTER — Other Ambulatory Visit: Payer: Self-pay | Admitting: Family

## 2022-06-04 ENCOUNTER — Ambulatory Visit (INDEPENDENT_AMBULATORY_CARE_PROVIDER_SITE_OTHER): Payer: Medicare Other | Admitting: Family

## 2022-06-04 VITALS — BP 118/78 | HR 62 | Resp 18 | Ht 65.0 in | Wt 286.0 lb

## 2022-06-04 DIAGNOSIS — R9389 Abnormal findings on diagnostic imaging of other specified body structures: Secondary | ICD-10-CM | POA: Diagnosis not present

## 2022-06-04 DIAGNOSIS — I10 Essential (primary) hypertension: Secondary | ICD-10-CM | POA: Diagnosis not present

## 2022-06-04 DIAGNOSIS — I456 Pre-excitation syndrome: Secondary | ICD-10-CM

## 2022-06-04 DIAGNOSIS — F419 Anxiety disorder, unspecified: Secondary | ICD-10-CM

## 2022-06-04 DIAGNOSIS — N390 Urinary tract infection, site not specified: Secondary | ICD-10-CM | POA: Diagnosis not present

## 2022-06-04 DIAGNOSIS — K5909 Other constipation: Secondary | ICD-10-CM | POA: Diagnosis not present

## 2022-06-04 DIAGNOSIS — G369 Acute disseminated demyelination, unspecified: Secondary | ICD-10-CM

## 2022-06-04 DIAGNOSIS — E271 Primary adrenocortical insufficiency: Secondary | ICD-10-CM

## 2022-06-04 LAB — CBC WITH DIFFERENTIAL/PLATELET
Basophils Absolute: 0.1 10*3/uL (ref 0.0–0.1)
Basophils Relative: 1 % (ref 0.0–3.0)
Eosinophils Absolute: 0.2 10*3/uL (ref 0.0–0.7)
Eosinophils Relative: 3.3 % (ref 0.0–5.0)
HCT: 39.2 % (ref 36.0–46.0)
Hemoglobin: 13.2 g/dL (ref 12.0–15.0)
Lymphocytes Relative: 14.6 % (ref 12.0–46.0)
Lymphs Abs: 1.1 10*3/uL (ref 0.7–4.0)
MCHC: 33.7 g/dL (ref 30.0–36.0)
MCV: 91.7 fl (ref 78.0–100.0)
Monocytes Absolute: 0.6 10*3/uL (ref 0.1–1.0)
Monocytes Relative: 7.5 % (ref 3.0–12.0)
Neutro Abs: 5.5 10*3/uL (ref 1.4–7.7)
Neutrophils Relative %: 73.6 % (ref 43.0–77.0)
Platelets: 228 10*3/uL (ref 150.0–400.0)
RBC: 4.27 Mil/uL (ref 3.87–5.11)
RDW: 14.5 % (ref 11.5–15.5)
WBC: 7.5 10*3/uL (ref 4.0–10.5)

## 2022-06-04 LAB — COMPREHENSIVE METABOLIC PANEL
ALT: 10 U/L (ref 0–35)
AST: 12 U/L (ref 0–37)
Albumin: 4.3 g/dL (ref 3.5–5.2)
Alkaline Phosphatase: 82 U/L (ref 39–117)
BUN: 17 mg/dL (ref 6–23)
CO2: 28 mEq/L (ref 19–32)
Calcium: 9.5 mg/dL (ref 8.4–10.5)
Chloride: 103 mEq/L (ref 96–112)
Creatinine, Ser: 0.91 mg/dL (ref 0.40–1.20)
GFR: 64.55 mL/min (ref >60.00)
Glucose, Bld: 105 mg/dL — ABNORMAL HIGH (ref 70–99)
Potassium: 3.7 mEq/L (ref 3.5–5.1)
Sodium: 141 mEq/L (ref 135–145)
Total Bilirubin: 0.5 mg/dL (ref 0.2–1.2)
Total Protein: 6.4 g/dL (ref 6.0–8.3)

## 2022-06-04 MED ORDER — CITALOPRAM HYDROBROMIDE 40 MG PO TABS: 40.0000 mg | ORAL_TABLET | Freq: Every day | ORAL | 3 refills | Status: DC

## 2022-06-04 MED ORDER — TELMISARTAN-HCTZ 80-12.5 MG PO TABS: 1.0000 | ORAL_TABLET | Freq: Every day | ORAL | 3 refills | Status: DC

## 2022-06-04 MED ORDER — LINZESS 72 MCG PO CAPS: 72.0000 ug | ORAL_CAPSULE | Freq: Every day | ORAL | 3 refills | Status: DC

## 2022-06-04 NOTE — Progress Notes (Signed)
Connie Haynes is a 67 y.o. female with the following history as recorded in EpicCare:  Patient Active Problem List   Diagnosis Date Noted   Long-term current use of immunomodulator 05/18/2022   Acid reflux 10/14/2021   Epigastric pain 10/14/2021   Decreased GFR 09/26/2021   Palpitations - suggestive of PVC's 06/24/2021   Abnormal urinalysis 03/21/2021   Acute cystitis with hematuria 12/20/2020   Age related osteoporosis 12/20/2020   Nocturnal hypoxia 12/20/2020   Post-menopausal 12/20/2020   Supplemental oxygen dependent 12/20/2020   Disease of spinal cord 10/30/2020   Encounter for immunotherapy 10/30/2020   History of optic neuritis 10/30/2020   Hypokalemia 09/19/2020   Urinary retention 07/17/2020   Encounter for monitoring immunomodulating therapy 05/02/2020   Vitamin D deficiency 03/07/2020   Numbness 08/29/2019   Gait disturbance 08/29/2019   Abnormal brain MRI 08/29/2019   High risk medication use 08/29/2019   Neuromyelitis 08/11/2019   Adrenal insufficiency (Addison's disease) 08/11/2019   Neck pain 02/08/2019   CKD (chronic kidney disease) stage 2, GFR 60-89 ml/min 06/23/2017   Nausea and vomiting 05/05/2017   S/P left TKA 08/25/2016   S/P total knee replacement 08/25/2016   Imbalance 01/16/2016   Lumbar disc herniation with radiculopathy 01/16/2016   Lumbar facet arthropathy 01/16/2016   Low back pain 06/24/2015   Tremor 06/24/2015   Weakness of both lower extremities 06/24/2015   S/P shoulder replacement 06/29/2014   OSA (obstructive sleep apnea) 01/22/2014   Left ureteral stone 12/18/2013   UTI (urinary tract infection) 12/18/2013   Morbid obesity 08/09/2013   S/P right TKA 08/08/2013   Inflammation of eyelid, left 06/01/2013   Dry eye of left side 05/01/2013   Nasolacrimal duct obstruction, acquired 03/25/2013   Epiphora due to insufficient drainage 03/09/2013   Chronic pain syndrome 03/04/2013   Depressive disorder, not elsewhere classified 03/04/2013    Ectropion, senile 02/20/2013   Punctal stenosis 02/20/2013   Trichiasis of eyelid 02/20/2013   Need for prophylactic vaccination and inoculation against influenza 10/04/2012   Erosive osteoarthritis of multiple sites 10/04/2012   Chronic cough 10/04/2012   Swelling of limb 08/21/2012   Essential hypertension, benign 08/21/2012   Preoperative general physical examination 06/12/2012   Restless leg syndrome 06/12/2012   Anxiety 06/12/2012   Headache(784.0) 04/04/2012   Sinusitis, chronic 04/04/2012   Blocked lacrimal duct 09/23/2011   Excessive tearing 09/23/2011   Lung nodule 07/16/2011   Cough 04/21/2011   Wolff-Parkinson-White (WPW) syndrome    Osteoarthritis    Hyperlipidemia    IBS (irritable bowel syndrome)    Kidney stones     Current Outpatient Medications  Medication Sig Dispense Refill   carvedilol (COREG) 25 MG tablet Take 1 tablet by mouth twice daily 180 tablet 0   furosemide (LASIX) 40 MG tablet Take 2 tablets (80 mg total) by mouth every other day. 90 tablet 3   hydrocortisone (CORTEF) 10 MG tablet Take 5-10 mg by mouth See admin instructions. Take  in the AM and  in the afternoon.     LORazepam (ATIVAN) 0.5 MG tablet One po qd prn 30 tablet 3   Multiple Vitamin (MULTIVITAMIN ADULT PO) Take 1 tablet by mouth daily.     ondansetron (ZOFRAN) 4 MG tablet Take 4 mg by mouth every 8 (eight) hours as needed.     potassium chloride (KLOR-CON) 10 MEQ tablet Take 10 mEq by mouth 2 (two) times daily.     pramipexole (MIRAPEX) 1 MG tablet TAKE 1 & 1/2 (ONE &  ONE-HALF) TABLETS BY MOUTH AT BEDTIME 45 tablet 5   predniSONE (DELTASONE) 20 MG tablet Take 3 pills po the day before infusions. 12 tablet 0   riTUXimab (RITUXAN IV) Inject into the vein.     tiZANidine (ZANAFLEX) 4 MG tablet TAKE 1 TO 1 & 1/2 (ONE & ONE-HALF) TABLETS BY MOUTH THREE TIMES DAILY AS NEEDED 270 tablet 1   traMADol (ULTRAM) 50 MG tablet TAKE 1 TABLET BY MOUTH EVERY 8 HOURS 90 tablet 0   citalopram  (CELEXA) 40 MG tablet Take 1 tablet (40 mg total) by mouth daily. 90 tablet 3   LINZESS 72 MCG capsule Take 1 capsule (72 mcg total) by mouth daily. 90 capsule 3   telmisartan-hydrochlorothiazide (MICARDIS HCT) 80-12.5 MG tablet Take 1 tablet by mouth daily. 90 tablet 3   No current facility-administered medications for this visit.   Facility-Administered Medications Ordered in Other Visits  Medication Dose Route Frequency Provider Last Rate Last Admin   chlorhexidine (HIBICLENS) 4 % liquid 4 application  60 mL Topical Once Dixon, Katharina Caper, PA-C       technetium tetrofosmin (TC-MYOVIEW) injection 31.7 milli Curie  31.7 millicurie Intravenous Once PRN Lars Masson, MD        Allergies: Codeine, Hydrocodone, Lamotrigine, and Robaxin [methocarbamol]  Past Medical History:  Diagnosis Date   Adrenal insufficiency    Anemia    denies   Anxiety    Basal cell carcinoma    eye lid   Chronic kidney disease, stage 3    Cough    hx. dry cough chronic   Disorder of tear duct system    frequent tearing of eyes   GERD (gastroesophageal reflux disease)    H/O: whooping cough 2000   History of kidney infection 2015   ? sepsis   History of kidney stones    History of kidney stones    Hyperlipidemia    Hypertension    IBS (irritable bowel syndrome)    controlled with med   Osteoarthritis    arthritis -joints-limited ROM shoulders more on right   PONV (postoperative nausea and vomiting)     per pt hx of low BP following shoulder surgery in 05-2015; was managed in hospital with IV fluids and d/c BP being taken a tthe time; pt was dced and F/U with endcrinoopgist who dx with adrenal insurffiency and started steroid therapy; treatmetnt with positive outome, now managed chronically on hydrocortisone tablets;   Sleep apnea    no longer using cpap   Stroke    per patient "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 Conni Slipper)   APPENDECTOMY     CARDIAC ELECTROPHYSIOLOGY STUDY AND ABLATION     '93 for WPW syndrome   CARPAL TUNNEL RELEASE Right 12/21/2014   Procedure: RIGHT CARPAL TUNNEL RELEASE ;  Surgeon: Beverely Low, MD;  Location: [facility] OR;  Service: Orthopedics;  Laterality: Right;   CARPAL TUNNEL RELEASE Left    CERVICAL SPINE SURGERY     cervical fusion with hardware retained   COLON SURGERY     "  colon detached from abdomen"   COLONOSCOPY WITH PROPOFOL N/A 05/10/2014   Procedure: COLONOSCOPY WITH PROPOFOL;  Surgeon: Charna Elizabeth, MD;  Location: [facility];  Service: Endoscopy;  Laterality: N/A;   CYSTOSCOPY/RETROGRADE/URETEROSCOPY Left 12/18/2013   Procedure: CYSTOSCOPY/RETROGRADE/LEFT STENT;  Surgeon: Anner Crete, MD;  Location: [facility];  Service: Urology;  Laterality: Left;   ESOPHAGOGASTRODUODENOSCOPY (EGD) WITH PROPOFOL N/A 05/10/2014   Procedure: ESOPHAGOGASTRODUODENOSCOPY (EGD) WITH PROPOFOL;  Surgeon: Charna Elizabeth, MD;  Location: [facility];  Service: Endoscopy;  Laterality: N/A;   EYELID CARCINOMA EXCISION     INSERTION OF DIALYSIS CATHETER N/A 08/16/2019   Procedure: INSERTION OF DIALYSIS CATHETER;  Surgeon: Josephine Igo, DO;  Location: [facility];  Service: Pulmonary;  Laterality: N/A;   JOINT REPLACEMENT     KNEE ARTHROSCOPY Left 07/16/2016   REVERSE SHOULDER ARTHROPLASTY Right 06/29/2014   Procedure: REVERSE TOTAL SHOULDER ARTHROPLASTY;  Surgeon: Beverely Low, MD;  Location: [facility] OR;  Service: Orthopedics;  Laterality: Right;   REVISION TOTAL SHOULDER TO REVERSE TOTAL SHOULDER Left 05/15/2015   REVISION TOTAL SHOULDER TO REVERSE TOTAL SHOULDER Left  05/15/2015   Procedure: LEFT SHOULDER REVISION TOTAL SHOULDER ARTHRPLASTY TO REVERSE TOTAL SHOULDER ARTHROPLASTY ;  Surgeon: Beverely Low, MD;  Location: [facility] OR;  Service: Orthopedics;  Laterality: Left;   tear duct surgery     TOTAL ABDOMINAL HYSTERECTOMY     TOTAL HIP ARTHROPLASTY     x 3(rt x2, lt x1)   TOTAL KNEE ARTHROPLASTY Right 08/08/2013   Procedure: RIGHT TOTAL KNEE ARTHROPLASTY WITH SAPHENOUS NERVE SCAR EXCISION;  Surgeon: Shelda Pal, MD;  Location: [facility];  Service: Orthopedics;  Laterality: Right;   TOTAL KNEE ARTHROPLASTY Left 08/25/2016   Procedure: LEFT TOTAL KNEE ARTHROPLASTY;  Surgeon: Durene Romans, MD;  Location: [facility];  Service: Orthopedics;  Laterality: Left;  70 mins; Adductor block   TOTAL SHOULDER ARTHROPLASTY Left 07/15/2012   Procedure: LEFT TOTAL SHOULDER ARTHROPLASTY;  Surgeon: Verlee Rossetti, MD;  Location: [facility] OR;  Service: Orthopedics;  Laterality: Left;   TOTAL SHOULDER REVISION Right 12/21/2014   Procedure: RIGHT SHOULDER POLY EXCHANGE ;  Surgeon: Beverely Low, MD;  Location: [facility] OR;  Service: Orthopedics;  Laterality: Right;   URETEROSCOPY  stent placement    Family History  Problem Relation Age of Onset   Leukemia Brother    Heart disease Mother    Hypertension Mother    Hyperlipidemia Mother    Heart attack Mother    Heart failure Mother    Heart disease Maternal Grandfather    Heart attack Maternal Grandfather    Cancer Paternal Grandfather    Lung cancer Paternal Grandfather    Cancer Maternal Uncle        bone    Social History   Tobacco Use   Smoking status: Never   Smokeless tobacco: Never  Substance Use Topics   Alcohol use: No    Alcohol/week: 0.0 standard drinks of alcohol    Subjective:   Presents today as a new patient; needs to establish with new PCP; Under care of endocrine for adrenal insufficiency/ cardiology yearly for WPW syndrome/ neurology for neuromyelitis; Due to neuromyelitis, 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  Head: Normocephalic and atraumatic  Eyes: Sclera and conjunctiva clear; pupils round and reactive to light; extraocular movements intact  Ears: External normal; canals clear; tympanic membranes normal  Oropharynx: Pink, supple. No suspicious lesions  Neck: Supple without thyromegaly, adenopathy  Lungs: Respirations unlabored; clear to auscultation bilaterally without wheeze, rales, rhonchi  CVS exam: normal rate and regular rhythm.  Musculoskeletal: No deformities; no active joint inflammation  Extremities: No edema, cyanosis, clubbing  Vessels: Symmetric bilaterally  Neurologic: Alert and oriented; speech intact; face symmetrical; slow/ methodical gait;  Assessment:  1. Abnormal CXR   2. Primary hypertension   3. Recurrent UTI   4. Chronic constipation   5. Anxiety   6. Adrenal insufficiency (Addison's disease)   7. Wolff-Parkinson-White (WPW) syndrome     Plan:  Update CXR today; Check CBC, CMP today; update refill on Telmisartan HCT; Will check U/A and urine culture today; plan to have standing orders in place so she can easily drop off sample if she feels UTI is starting; Stable; refill updated; colonoscopy due in 2025- will get records from Dr. Loreta Ave; Stable; refill updated;  Under care of endocrinology; Under care of cardiology; Under care of neurology;   No follow-ups on file.  Orders Placed This Encounter  Procedures   Urine Culture   DG Chest 2 View    Standing Status:   Future    Number of Occurrences:   1    Standing Expiration Date:   06/04/2023    Order  Specific Question:   Reason for Exam (SYMPTOM  OR DIAGNOSIS REQUIRED)    Answer:   abnormal CXR    Order Specific Question:   Preferred imaging location?    Answer:   [facility]   CBC with Differential/Platelet   Comp Met (CMET)    Requested Prescriptions   Signed Prescriptions Disp Refills   citalopram (CELEXA) 40 MG tablet 90 tablet 3    Sig: Take 1 tablet (40 mg total) by mouth daily.   LINZESS 72 MCG capsule 90 capsule 3    Sig: Take 1 capsule (72 mcg total) by mouth daily.   telmisartan-hydrochlorothiazide (MICARDIS HCT) 80-12.5 MG tablet 90 tablet 3    Sig: Take 1 tablet by mouth daily.

## 2022-06-05 LAB — URINE CULTURE
MICRO NUMBER:: 14873658
SPECIMEN QUALITY:: ADEQUATE

## 2022-06-12 ENCOUNTER — Other Ambulatory Visit: Payer: Self-pay | Admitting: Neurology

## 2022-06-15 ENCOUNTER — Other Ambulatory Visit: Payer: Self-pay

## 2022-06-15 MED ORDER — LORAZEPAM 0.5 MG PO TABS: ORAL_TABLET | ORAL | 5 refills | Status: DC

## 2022-06-15 MED ORDER — LORAZEPAM 0.5 MG PO TABS: ORAL_TABLET | ORAL | 4 refills | Status: DC

## 2022-06-15 NOTE — Telephone Encounter (Signed)
Last seen on 04/29/22  Follow up on 10/20/22 Rx last filled on 05/18/22 # 90 tablets (30 day supply) Rx pending to be signed

## 2022-06-15 NOTE — Addendum Note (Signed)
Addended by: Despina Arias A on: 06/15/2022 01:09 PM   Modules accepted: Orders

## 2022-06-15 NOTE — Telephone Encounter (Signed)
Last Seen 04/29/2022 Upcoming Appointment 10/20/2022  Lorazepam last filled 04/20/2022 Escript 06/15/2022 4 Refills

## 2022-06-16 ENCOUNTER — Other Ambulatory Visit (INDEPENDENT_AMBULATORY_CARE_PROVIDER_SITE_OTHER): Payer: Medicare Other

## 2022-06-16 DIAGNOSIS — N390 Urinary tract infection, site not specified: Secondary | ICD-10-CM

## 2022-06-16 LAB — URINALYSIS
Bilirubin Urine: NEGATIVE
Hgb urine dipstick: NEGATIVE
Leukocytes,Ua: NEGATIVE
Nitrite: NEGATIVE
Specific Gravity, Urine: >=1.03 — AB (ref 1.000–1.030)
Urine Glucose: NEGATIVE
Urobilinogen, UA: 0.2 (ref 0.0–1.0)
pH: 6 (ref 5.0–8.0)

## 2022-06-17 ENCOUNTER — Other Ambulatory Visit: Payer: Self-pay | Admitting: Internal Medicine

## 2022-06-17 ENCOUNTER — Encounter: Payer: Self-pay | Admitting: Neurology

## 2022-06-18 ENCOUNTER — Encounter: Payer: Self-pay | Admitting: Family

## 2022-06-18 ENCOUNTER — Ambulatory Visit (INDEPENDENT_AMBULATORY_CARE_PROVIDER_SITE_OTHER): Payer: Medicare Other | Admitting: Family

## 2022-06-18 ENCOUNTER — Other Ambulatory Visit: Payer: Self-pay | Admitting: Family

## 2022-06-18 VITALS — BP 112/54 | HR 60 | Ht 65.0 in | Wt 285.4 lb

## 2022-06-18 DIAGNOSIS — H6123 Impacted cerumen, bilateral: Secondary | ICD-10-CM | POA: Diagnosis not present

## 2022-06-18 LAB — URINE CULTURE
MICRO NUMBER:: 14923613
SPECIMEN QUALITY:: ADEQUATE

## 2022-06-18 MED ORDER — NITROFURANTOIN MONOHYD MACRO 100 MG PO CAPS: 100.0000 mg | ORAL_CAPSULE | Freq: Two times a day (BID) | ORAL | 0 refills | Status: DC

## 2022-06-18 MED ORDER — NEOMYCIN-POLYMYXIN-HC 3.5-10000-1 OT SOLN: 3.0000 [drp] | Freq: Three times a day (TID) | OTIC | 0 refills | Status: DC

## 2022-06-18 NOTE — Progress Notes (Signed)
Connie Haynes is a 67 y.o. female with the following history as recorded in EpicCare:  Patient Active Problem List   Diagnosis Date Noted   Long-term current use of immunomodulator 05/18/2022   Acid reflux 10/14/2021   Epigastric pain 10/14/2021   Decreased GFR 09/26/2021   Palpitations - suggestive of PVC's 06/24/2021   Abnormal urinalysis 03/21/2021   Acute cystitis with hematuria 12/20/2020   Age related osteoporosis 12/20/2020   Nocturnal hypoxia 12/20/2020   Post-menopausal 12/20/2020   Supplemental oxygen dependent 12/20/2020   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HCC) 10/30/2020   Encounter for immunotherapy 10/30/2020   History of optic neuritis 10/30/2020   Hypokalemia 09/19/2020   Urinary retention 07/17/2020   Encounter for monitoring immunomodulating therapy 05/02/2020   Vitamin D deficiency 03/07/2020   Numbness 08/29/2019   Gait disturbance 08/29/2019   Abnormal brain MRI 08/29/2019   High risk medication use 08/29/2019   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HCC) 08/11/2019   Adrenal insufficiency (Addison's disease) (HCC) 08/11/2019   Neck pain 02/08/2019   CKD (chronic kidney disease) stage 2, GFR 60-89 ml/min 06/23/2017   Nausea and vomiting 05/05/2017   S/P left TKA 08/25/2016   S/P total knee replacement 08/25/2016   Imbalance 01/16/2016   Lumbar disc herniation with radiculopathy 01/16/2016   Lumbar facet arthropathy 01/16/2016   Low back pain 06/24/2015   Tremor 06/24/2015   Weakness of both lower extremities 06/24/2015   S/P shoulder replacement 06/29/2014   OSA (obstructive sleep apnea) 01/22/2014   Left ureteral stone 12/18/2013   UTI (urinary tract infection) 12/18/2013   Morbid obesity (HCC) 08/09/2013   S/P right TKA 08/08/2013   Inflammation of eyelid, left 06/01/2013   Dry eye of left side 05/01/2013   Nasolacrimal duct obstruction, acquired 03/25/2013   Epiphora due to insufficient drainage 03/09/2013   Chronic pain syndrome 03/04/2013   Depressive disorder, not elsewhere  classified 03/04/2013   Ectropion, senile 02/20/2013   Punctal stenosis 02/20/2013   Trichiasis of eyelid 02/20/2013   Need for prophylactic vaccination and inoculation against influenza 10/04/2012   Erosive osteoarthritis of multiple sites 10/04/2012   Chronic cough 10/04/2012   Swelling of limb 08/21/2012   Essential hypertension, benign 08/21/2012   Preoperative general physical examination 06/12/2012   Restless leg syndrome 06/12/2012   Anxiety 06/12/2012   Headache(784.0) 04/04/2012   Sinusitis, chronic 04/04/2012   Blocked lacrimal duct 09/23/2011   Excessive tearing 09/23/2011   Lung nodule 07/16/2011   Cough 04/21/2011   Wolff-Parkinson-White (WPW) syndrome    Osteoarthritis    Hyperlipidemia    IBS (irritable bowel syndrome)    Kidney stones     Current Outpatient Medications  Medication Sig Dispense Refill   carvedilol (COREG) 25 MG tablet Take 1 tablet by mouth twice daily 180 tablet 0   citalopram (CELEXA) 40 MG tablet Take 1 tablet (40 mg total) by mouth daily. 90 tablet 3   furosemide (LASIX) 40 MG tablet TAKE 2 TABLETS BY MOUTH EVERY OTHER DAY 30 tablet 0   hydrocortisone (CORTEF) 10 MG tablet Take 5-10 mg by mouth See admin instructions. Take 10mg  in the AM and 5mg  in the afternoon.     LINZESS 72 MCG capsule Take 1 capsule (72 mcg total) by mouth daily. 90 capsule 3   LORazepam (ATIVAN) 0.5 MG tablet One po qd prn 30 tablet 5   Multiple Vitamin (MULTIVITAMIN ADULT PO) Take 1 tablet by mouth daily.     neomycin-polymyxin-hydrocortisone (CORTISPORIN) OTIC solution Place 3 drops into both ears 3 (  three) times daily. 10 mL 0   ondansetron (ZOFRAN) 4 MG tablet Take 4 mg by mouth every 8 (eight) hours as needed.     potassium chloride (KLOR-CON) 10 MEQ tablet Take 10 mEq by mouth 2 (two) times daily.     pramipexole (MIRAPEX) 1 MG tablet TAKE 1 & 1/2 (ONE & ONE-HALF) TABLETS BY MOUTH AT BEDTIME 45 tablet 5   predniSONE (DELTASONE) 20 MG tablet Take 3 pills po the day  before infusions. 12 tablet 0   riTUXimab (RITUXAN IV) Inject into the vein.     telmisartan-hydrochlorothiazide (MICARDIS HCT) 80-12.5 MG tablet Take 1 tablet by mouth daily. 90 tablet 3   tiZANidine (ZANAFLEX) 4 MG tablet TAKE 1 TO 1 & 1/2 (ONE & ONE-HALF) TABLETS BY MOUTH THREE TIMES DAILY AS NEEDED 270 tablet 1   traMADol (ULTRAM) 50 MG tablet TAKE 1 TABLET BY MOUTH EVERY 8 HOURS 90 tablet 0   No current facility-administered medications for this visit.   Facility-Administered Medications Ordered in Other Visits  Medication Dose Route Frequency Provider Last Rate Last Admin   chlorhexidine (HIBICLENS) 4 % liquid 4 application  60 mL Topical Once Dixon, Katharina Caper, PA-C       technetium tetrofosmin (TC-MYOVIEW) injection 31.7 milli Curie  31.7 millicurie Intravenous Once PRN Lars Masson, MD        Allergies: Codeine, Hydrocodone, Lamotrigine, and Robaxin [methocarbamol]  Past Medical History:  Diagnosis Date   Adrenal insufficiency (HCC)    Anemia    denies   Anxiety    Basal cell carcinoma    eye lid   Chronic kidney disease, stage 3 (HCC)    Cough    hx. dry cough chronic   Disorder of tear duct system    frequent tearing of eyes   GERD (gastroesophageal reflux disease)    H/O: whooping cough 2000   History of kidney infection 2015   ? sepsis   History of kidney stones    History of kidney stones    Hyperlipidemia    Hypertension    IBS (irritable bowel syndrome)    controlled with med   Osteoarthritis    arthritis -joints-limited ROM shoulders more on right   PONV (postoperative nausea and vomiting)     per pt hx of low BP following shoulder surgery in 05-2015; was managed in hospital with IV fluids and d/c BP being taken a tthe time; pt was dced and F/U with endcrinoopgist who dx with adrenal insurffiency and started steroid therapy; treatmetnt with positive outome, now managed chronically on hydrocortisone tablets;   Sleep apnea    no longer using cpap    Stroke [facility])    per patient "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 Conni Slipper)   APPENDECTOMY     CARDIAC ELECTROPHYSIOLOGY STUDY AND ABLATION     '93 for WPW syndrome   CARPAL TUNNEL RELEASE Right 12/21/2014   Procedure: RIGHT CARPAL TUNNEL RELEASE ;  Surgeon: Beverely Low, MD;  Location: [facility] OR;  Service: Orthopedics;  Laterality: Right;  CARPAL TUNNEL RELEASE Left    CERVICAL SPINE SURGERY     cervical fusion with hardware retained   COLON SURGERY     "colon detached from abdomen"   COLONOSCOPY WITH PROPOFOL N/A 05/10/2014   Procedure: COLONOSCOPY WITH PROPOFOL;  Surgeon: Charna Elizabeth, MD;  Location[facility];  Service: Endoscopy;  Laterality: N/A;   CYSTOSCOPY/RETROGRADE/URETEROSCOPY Left 12/18/2013   Procedure: CYSTOSCOPY/RETROGRADE/LEFT STENT;  Surgeon: Anner Crete, MD;  Location: [facility];  Service: Urology;  Laterality: Left;   ESOPHAGOGASTRODUODENOSCOPY (EGD) WITH PROPOFOL N/A 05/10/2014   Procedure: ESOPHAGOGASTRODUODENOSCOPY (EGD) WITH PROPOFOL;  Surgeon: Charna Elizabeth, MD;  [facility];  Service: Endoscopy;  Laterality: N/A;   EYELID CARCINOMA EXCISION     INSERTION OF DIALYSIS CATHETER N/A 08/16/2019   Procedure: INSERTION OF DIALYSIS CATHETER;  Surgeon: Josephine Igo, DO;  Location: [facility];  Service: Pulmonary;  Laterality: N/A;   JOINT REPLACEMENT     KNEE ARTHROSCOPY Left 07/16/2016   REVERSE SHOULDER ARTHROPLASTY Right 06/29/2014   Procedure: REVERSE TOTAL SHOULDER ARTHROPLASTY;  Surgeon: Beverely Low, MD;  Location: [facility] OR;  Service: Orthopedics;   Laterality: Right;   REVISION TOTAL SHOULDER TO REVERSE TOTAL SHOULDER Left 05/15/2015   REVISION TOTAL SHOULDER TO REVERSE TOTAL SHOULDER Left 05/15/2015   Procedure: LEFT SHOULDER REVISION TOTAL SHOULDER ARTHRPLASTY TO REVERSE TOTAL SHOULDER ARTHROPLASTY ;  Surgeon: Beverely Low, MD;  Location: [facility] OR;  Service: Orthopedics;  Laterality: Left;   tear duct surgery     TOTAL ABDOMINAL HYSTERECTOMY     TOTAL HIP ARTHROPLASTY     x 3(rt x2, lt x1)   TOTAL KNEE ARTHROPLASTY Right 08/08/2013   Procedure: RIGHT TOTAL KNEE ARTHROPLASTY WITH SAPHENOUS NERVE SCAR EXCISION;  Surgeon: Shelda Pal, MD;  Location: [facility];  Service: Orthopedics;  Laterality: Right;   TOTAL KNEE ARTHROPLASTY Left 08/25/2016   Procedure: LEFT TOTAL KNEE ARTHROPLASTY;  Surgeon: Durene Romans, MD;  Location: [facility];  Service: Orthopedics;  Laterality: Left;  70 mins; Adductor block   TOTAL SHOULDER ARTHROPLASTY Left 07/15/2012   Procedure: LEFT TOTAL SHOULDER ARTHROPLASTY;  Surgeon: Verlee Rossetti, MD;  Location: [facility] OR;  Service: Orthopedics;  Laterality: Left;   TOTAL SHOULDER REVISION Right 12/21/2014   Procedure: RIGHT SHOULDER POLY EXCHANGE ;  Surgeon: Beverely Low, MD;  Location: [facility] OR;  Service: Orthopedics;  Laterality: Right;   URETEROSCOPY  stent placement    Family History  Problem Relation Age of Onset   Leukemia Brother    Heart disease Mother    Hypertension Mother    Hyperlipidemia Mother    Heart attack Mother    Heart failure Mother    Heart disease Maternal Grandfather    Heart attack Maternal Grandfather    Cancer Paternal Grandfather    Lung cancer Paternal Grandfather    Cancer Maternal Uncle        bone    Social History   Tobacco Use   Smoking status: Never   Smokeless tobacco: Never  Substance Use Topics   Alcohol use: No    Alcohol/week: 0.0 standard drinks of alcohol    Subjective:   Concerned about hearing loss in right ear; no prior problems with ear wax; has been using OTC ear  drops and hydrogen peroxide;   Objective:  Vitals:   06/18/22 0928  BP: (!) 112/54  Pulse: 60  SpO2: 96%  Weight: 285 lb 6.4 oz (129.5 kg)  Height: 5\' 5"  (1.651 m)    General: Well developed, well  nourished, in no acute distress  Skin : Warm and dry.  Head: Normocephalic and atraumatic  Eyes: Sclera and conjunctiva clear; pupils round and reactive to light; extraocular movements intact  Ears: External normal; on initial exam, bilateral cerumen impaction; after lavage, canals clear; tympanic membranes normal  Oropharynx: Pink, supple. No suspicious lesions  Neck: Supple without thyromegaly, adenopathy  Lungs: Respirations unlabored;  Neurologic: Alert and oriented; speech intact; face symmetrical; moves all extremities well; CNII-XII intact without focal deficit   Assessment:  1. Bilateral impacted cerumen     Plan:  Ear lavage completed in office with no complications; patient does notice improvement in hearing already; Rx for Cortisporin Otic suspension 3 drops tid x 2-3 days; follow up worse, no better.  No follow-ups on file.  No orders of the defined types were placed in this encounter.   Requested Prescriptions   Signed Prescriptions Disp Refills   neomycin-polymyxin-hydrocortisone (CORTISPORIN) OTIC solution 10 mL 0    Sig: Place 3 drops into both ears 3 (three) times daily.

## 2022-06-29 ENCOUNTER — Encounter: Payer: Self-pay | Admitting: Family

## 2022-06-29 ENCOUNTER — Other Ambulatory Visit: Payer: Self-pay | Admitting: Family

## 2022-06-29 ENCOUNTER — Encounter: Payer: Self-pay | Admitting: Neurology

## 2022-06-29 ENCOUNTER — Other Ambulatory Visit: Payer: Self-pay | Admitting: *Deleted

## 2022-06-29 DIAGNOSIS — H9191 Unspecified hearing loss, right ear: Secondary | ICD-10-CM

## 2022-06-29 MED ORDER — TIZANIDINE HCL 4 MG PO TABS: ORAL_TABLET | ORAL | 1 refills | Status: DC

## 2022-07-03 ENCOUNTER — Other Ambulatory Visit: Payer: Self-pay | Admitting: Internal Medicine

## 2022-07-07 NOTE — Telephone Encounter (Signed)
Are you able to track this referral down?

## 2022-07-12 ENCOUNTER — Other Ambulatory Visit: Payer: Self-pay | Admitting: Internal Medicine

## 2022-07-22 ENCOUNTER — Telehealth: Payer: Self-pay | Admitting: Family

## 2022-07-22 ENCOUNTER — Other Ambulatory Visit: Payer: Self-pay | Admitting: Family

## 2022-07-22 ENCOUNTER — Encounter: Payer: Self-pay | Admitting: Family

## 2022-07-22 DIAGNOSIS — N390 Urinary tract infection, site not specified: Secondary | ICD-10-CM

## 2022-07-22 NOTE — Telephone Encounter (Signed)
Pt called stating that she had standing orders to check for a UTI in MyChart every 4 weeks. After discussing with Shanda Bumps, advised pt that no standing orders were seen for this but a note would be sent back to get active orders for the urinalysis and urine culture. Pt stated that was fine but she did want to look into the standing orders to see why they were not showing up.

## 2022-07-23 NOTE — Telephone Encounter (Signed)
Yes I can see the future orders

## 2022-07-24 ENCOUNTER — Other Ambulatory Visit: Payer: Self-pay | Admitting: Family

## 2022-07-24 DIAGNOSIS — N390 Urinary tract infection, site not specified: Secondary | ICD-10-CM

## 2022-07-24 DIAGNOSIS — G369 Acute disseminated demyelination, unspecified: Secondary | ICD-10-CM

## 2022-07-24 DIAGNOSIS — E271 Primary adrenocortical insufficiency: Secondary | ICD-10-CM

## 2022-07-24 NOTE — Telephone Encounter (Signed)
Spoke with pt, pt is aware and expressed understanding. Scheduled pt a lab appointment 07/27/2022 for a urine culture.

## 2022-07-27 ENCOUNTER — Other Ambulatory Visit (INDEPENDENT_AMBULATORY_CARE_PROVIDER_SITE_OTHER): Payer: Medicare Other

## 2022-07-27 ENCOUNTER — Other Ambulatory Visit: Payer: Self-pay | Admitting: Neurology

## 2022-07-27 ENCOUNTER — Encounter: Payer: Self-pay | Admitting: Neurology

## 2022-07-27 DIAGNOSIS — N390 Urinary tract infection, site not specified: Secondary | ICD-10-CM

## 2022-07-27 LAB — URINALYSIS, ROUTINE W REFLEX MICROSCOPIC
Bilirubin Urine: NEGATIVE
Hgb urine dipstick: NEGATIVE
Ketones, ur: NEGATIVE
Nitrite: NEGATIVE
Specific Gravity, Urine: 1.025 (ref 1.000–1.030)
Urine Glucose: NEGATIVE
Urobilinogen, UA: 0.2 (ref 0.0–1.0)
pH: 6 (ref 5.0–8.0)

## 2022-07-28 ENCOUNTER — Other Ambulatory Visit: Payer: Self-pay

## 2022-07-28 LAB — URINE CULTURE
MICRO NUMBER:: 15090604
SPECIMEN QUALITY:: ADEQUATE

## 2022-07-28 MED ORDER — PRAMIPEXOLE DIHYDROCHLORIDE 1 MG PO TABS: ORAL_TABLET | ORAL | 5 refills | Status: DC

## 2022-08-03 ENCOUNTER — Other Ambulatory Visit: Payer: Self-pay | Admitting: Internal Medicine

## 2022-08-04 ENCOUNTER — Encounter: Payer: Self-pay | Admitting: Urology

## 2022-08-04 ENCOUNTER — Ambulatory Visit (INDEPENDENT_AMBULATORY_CARE_PROVIDER_SITE_OTHER): Payer: Medicare Other | Admitting: Urology

## 2022-08-04 VITALS — BP 135/81 | HR 66 | Ht 65.0 in | Wt 286.0 lb

## 2022-08-04 DIAGNOSIS — Z09 Encounter for follow-up examination after completed treatment for conditions other than malignant neoplasm: Secondary | ICD-10-CM

## 2022-08-04 DIAGNOSIS — Z87440 Personal history of urinary (tract) infections: Secondary | ICD-10-CM | POA: Diagnosis not present

## 2022-08-04 DIAGNOSIS — Z87442 Personal history of urinary calculi: Secondary | ICD-10-CM

## 2022-08-04 DIAGNOSIS — N390 Urinary tract infection, site not specified: Secondary | ICD-10-CM

## 2022-08-04 DIAGNOSIS — N200 Calculus of kidney: Secondary | ICD-10-CM

## 2022-08-04 LAB — URINALYSIS, ROUTINE W REFLEX MICROSCOPIC
Bilirubin, UA: NEGATIVE
Glucose, UA: NEGATIVE
Ketones, UA: NEGATIVE
Nitrite, UA: NEGATIVE
RBC, UA: NEGATIVE
Specific Gravity, UA: 1.025 (ref 1.005–1.030)
Urobilinogen, Ur: 0.2 mg/dL (ref 0.2–1.0)
pH, UA: 5.5 (ref 5.0–7.5)

## 2022-08-04 LAB — BLADDER SCAN AMB NON-IMAGING

## 2022-08-04 LAB — MICROSCOPIC EXAMINATION
Crystal Type: NONE SEEN
Crystals: NONE SEEN
RBC, Urine: NONE SEEN /hpf (ref 0–2)
Trichomonas, UA: NONE SEEN
WBC, UA: >30 /hpf — AB (ref 0–5)
Yeast, UA: NONE SEEN

## 2022-08-04 MED ORDER — PREMARIN 0.625 MG/GM VA CREA
TOPICAL_CREAM | VAGINAL | 12 refills | Status: DC
Start: 2022-08-04 — End: 2022-08-12

## 2022-08-04 NOTE — Progress Notes (Signed)
Assessment: 1. Frequent UTI   2. Nephrolithiasis     Plan: I personally reviewed the patient's chart including provider notes, and lab results. Resolve MDX culture sent today -will contact her with results. Methods to reduce the risk of UTIs discussed including increased fluid intake, timed and double voiding, daily cranberry supplement, daily probiotics, use of vaginal hormone cream, and possible use of prophylactic antibiotics. Begin vaginal hormone cream 2-3 times/week.  Instructions provided CT renal stone study for evaluation of nephrolithiasis Stone prevention discussed Return to office in 1 month  Chief Complaint:  Chief Complaint  Patient presents with   Frequent UTI    History of Present Illness:  Connie Haynes is a 67 y.o. female who is seen in consultation from Olive Bass, FNP for evaluation of frequent UTI's. She has been treated for frequent UTIs recently.  No typical UTI symptoms.  No dysuria or gross hematuria.  No fevers, chills, or flank pain.  She does occasionally notice some cloudy urine with a strong odor.  She was last treated for a UTI in May 2024.  Urine culture results: 6/21 50-100K E. Coli 6/22 <10K mixed flora 11/22 >100K Klebsiella 1/24 50-100K Enterobacter 3/24 <10K mixed flora 4/24 Mixed flora 5/24 10-49K Enterococcus 6/24 Mixed flora  She is unaware of the need to void.  She does not have a sensation of bladder fullness.  She voids on a schedule both daytime and nighttime.  She is not sure if she empties her bladder completely.  She has occasional episodes of incontinence without awareness.  These symptoms have been present since her diagnosis of transverse myelitis in 2021.  She has a history of kidney stones.  She required stent placement for a infected stone in 2015.  She reports passing a stone approximately 1 week ago.  No recent imaging.  Past Medical History:  Past Medical History:  Diagnosis Date   Adrenal insufficiency  (HCC)    Anemia    denies   Anxiety    Basal cell carcinoma    eye lid   Chronic kidney disease, stage 3 (HCC)    Cough    hx. dry cough chronic   Disorder of tear duct system    frequent tearing of eyes   GERD (gastroesophageal reflux disease)    H/O: whooping cough 2000   History of kidney infection 2015   ? sepsis   History of kidney stones    History of kidney stones    Hyperlipidemia    Hypertension    IBS (irritable bowel syndrome)    controlled with med   Osteoarthritis    arthritis -joints-limited ROM shoulders more on right   PONV (postoperative nausea and vomiting)     per pt hx of low BP following shoulder surgery in 05-2015; was managed in hospital with IV fluids and d/c BP being taken a tthe time; pt was dced and F/U with endcrinoopgist who dx with adrenal insurffiency and started steroid therapy; treatmetnt with positive outome, now managed chronically on hydrocortisone tablets;   Sleep apnea    no longer using cpap   Stroke [facility])    per patient "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 Conni Slipper)   APPENDECTOMY     CARDIAC ELECTROPHYSIOLOGY STUDY AND ABLATION     '93 for WPW syndrome   CARPAL TUNNEL RELEASE Right 12/21/2014   Procedure: RIGHT CARPAL TUNNEL RELEASE ;  Surgeon: Beverely Low, MD;  Location: [facility] OR;  Service: Orthopedics;  Laterality: Right;   CARPAL TUNNEL RELEASE Left    CERVICAL SPINE SURGERY     cervical fusion with hardware retained   COLON SURGERY     "colon detached from abdomen"   COLONOSCOPY WITH PROPOFOL N/A  05/10/2014   Procedure: COLONOSCOPY WITH PROPOFOL;  Surgeon: Charna Elizabeth, MD;  Location: [facility];  Service: Endoscopy;  Laterality: N/A;   CYSTOSCOPY/RETROGRADE/URETEROSCOPY Left 12/18/2013   Procedure: CYSTOSCOPY/RETROGRADE/LEFT STENT;  Surgeon: Anner Crete, MD;  Location: [facility];  Service: Urology;  Laterality: Left;   ESOPHAGOGASTRODUODENOSCOPY (EGD) WITH PROPOFOL N/A 05/10/2014   Procedure: ESOPHAGOGASTRODUODENOSCOPY (EGD) WITH PROPOFOL;  Surgeon: Charna Elizabeth, MD;  [facility];  Service: Endoscopy;  Laterality: N/A;   EYELID CARCINOMA EXCISION     INSERTION OF DIALYSIS CATHETER N/A 08/16/2019   Procedure: INSERTION OF DIALYSIS CATHETER;  Surgeon: Josephine Igo, DO;  Location: [facility];  Service: Pulmonary;  Laterality: N/A;   JOINT REPLACEMENT     KNEE ARTHROSCOPY Left 07/16/2016   REVERSE SHOULDER ARTHROPLASTY Right 06/29/2014   Procedure: REVERSE TOTAL SHOULDER ARTHROPLASTY;  Surgeon: Beverely Low, MD;  [facility] OR;  Service: Orthopedics;  Laterality: Right;   REVISION TOTAL SHOULDER TO REVERSE TOTAL SHOULDER Left 05/15/2015   REVISION TOTAL SHOULDER TO REVERSE TOTAL SHOULDER Left 05/15/2015   Procedure: LEFT SHOULDER REVISION TOTAL SHOULDER ARTHRPLASTY TO REVERSE TOTAL SHOULDER ARTHROPLASTY ;  Surgeon: Beverely Low, MD;  Location: [facility] OR;  Service: Orthopedics;  Laterality: Left;   tear duct surgery     TOTAL ABDOMINAL HYSTERECTOMY     TOTAL HIP ARTHROPLASTY     x 3(rt x2, lt x1)   TOTAL KNEE ARTHROPLASTY Right 08/08/2013   Procedure: RIGHT TOTAL KNEE ARTHROPLASTY WITH SAPHENOUS NERVE SCAR EXCISION;  Surgeon: Shelda Pal, MD;  Location: [facility];  Service: Orthopedics;  Laterality: Right;   TOTAL KNEE ARTHROPLASTY Left 08/25/2016   Procedure: LEFT TOTAL KNEE ARTHROPLASTY;  Surgeon: Durene Romans, MD;  Location: [facility];  Service: Orthopedics;  Laterality: Left;  70 mins; Adductor block   TOTAL SHOULDER ARTHROPLASTY Left 07/15/2012   Procedure: LEFT TOTAL SHOULDER  ARTHROPLASTY;  Surgeon: Verlee Rossetti, MD;  Location: [facility] OR;  Service: Orthopedics;  Laterality: Left;   TOTAL SHOULDER REVISION Right 12/21/2014   Procedure: RIGHT SHOULDER POLY EXCHANGE ;  Surgeon: Beverely Low, MD;  Location: [facility] OR;  Service: Orthopedics;  Laterality: Right;   URETEROSCOPY  stent placement    Allergies:  Allergies  Allergen Reactions   Codeine Nausea And Vomiting    Can take if takes phernergan   Hydrocodone Nausea Only   Lamotrigine     "dizziness, shakiness and just feeling not right."   Robaxin [Methocarbamol] Rash    Family History:  Family History  Problem Relation Age of Onset   Leukemia Brother    Heart disease Mother    Hypertension Mother    Hyperlipidemia Mother    Heart attack Mother    Heart failure Mother    Heart disease Maternal Grandfather    Heart attack Maternal Grandfather  Cancer Paternal Grandfather    Lung cancer Paternal Grandfather    Cancer Maternal Uncle        bone    Social History:  Social History   Tobacco Use   Smoking status: Never   Smokeless tobacco: Never  Vaping Use   Vaping Use: Never used  Substance Use Topics   Alcohol use: No    Alcohol/week: 0.0 standard drinks of alcohol   Drug use: No    Review of symptoms:  Constitutional:  Negative for unexplained weight loss, night sweats, fever, chills ENT:  Negative for nose bleeds, sinus pain, painful swallowing CV:  Negative for chest pain, shortness of breath, exercise intolerance, palpitations, loss of consciousness Resp:  Negative for cough, wheezing, shortness of breath GI:  Negative for nausea, vomiting, diarrhea, bloody stools GU:  Positives noted in HPI; otherwise negative for gross hematuria, dysuria Neuro:  Negative for seizures, slurred speech Psych:  Negative for lack of energy, depression, anxiety Endocrine:  Negative for polydipsia, polyuria, symptoms of hypoglycemia (dizziness, hunger, sweating) Hematologic:  Negative for anemia, purpura,  petechia, prolonged or excessive bleeding, use of anticoagulants  Allergic:  Negative for difficulty breathing or choking as a result of exposure to anything; no shellfish allergy; no allergic response (rash/itch) to materials, foods  Physical exam: BP 135/81   Pulse 66   Ht 5\' 5"  (1.651 m)   Wt 286 lb (129.7 kg)   BMI 47.59 kg/m  GENERAL APPEARANCE:  Well appearing, well developed, well nourished, NAD HEENT: Atraumatic, Normocephalic, oropharynx clear. NECK: Supple without lymphadenopathy or thyromegaly. LUNGS: Clear to auscultation bilaterally. HEART: Regular Rate and Rhythm without murmurs, gallops, or rubs. ABDOMEN: Soft, non-tender, No Masses. EXTREMITIES: Moves all extremities well.  Without clubbing, cyanosis, or edema. NEUROLOGIC:  Alert and oriented x 3, normal gait, CN II-XII grossly intact.  MENTAL STATUS:  Appropriate. BACK:  Non-tender to palpation.  No CVAT SKIN:  Warm, dry and intact.    Results: U/A:  >30 WBC, 0 RBC, many bacteria, nitrite negative  PVR:  21 ml

## 2022-08-05 NOTE — Progress Notes (Unsigned)
  Electrophysiology Office Note:   Date:  08/06/2022  ID:  Connie Haynes, DOB 27-Jul-1955, MRN [id number]  Primary Cardiologist: None Electrophysiologist: Sherryl Manges, MD      History of Present Illness:   Connie Haynes is a 67 y.o. female with h/o palpitations, LE edema, WPW s/p ablation, atypical chest pain, CKD III, GERD, HTN, and HLD seen today for routine electrophysiology followup.   Since last being seen in our clinic the patient reports doing well overall. Her  palpitations have been overall very well controlled on previously increased coreg, and her dyspnea has improved on increased lasix. She has had several episodes of lightheadedness associated with low BP, and shares a picture of BP cuff at home with BP in 80/30 range. Otherwise, no new complaints today.   Review of systems complete and found to be negative unless listed in HPI.   Studies Reviewed:    EKG is ordered today. Personal review as below.  EKG Interpretation Date/Time:  Thursday August 06 2022 09:03:04 EDT Ventricular Rate:  55 PR Interval:  136 QRS Duration:  92 QT Interval:  426 QTC Calculation: 407 R Axis:   11  Text Interpretation: Sinus bradycardia Septal QRS stable from prior as far back as 07/2013 Confirmed by Maxine Glenn [phone]) on 08/06/2022 9:15:42 AM           Physical Exam:   VS:  BP 112/66   Pulse (!) 59   Ht 5\' 5"  (1.651 m)   Wt 286 lb (129.7 kg)   SpO2 95%   BMI 47.59 kg/m    Wt Readings from Last 3 Encounters:  08/06/22 286 lb (129.7 kg)  08/04/22 286 lb (129.7 kg)  06/18/22 285 lb 6.4 oz (129.5 kg)     GEN: Well nourished, well developed in no acute distress NECK: No JVD; No carotid bruits CARDIAC: Regular rate and rhythm, no murmurs, rubs, gallops RESPIRATORY:  Clear to auscultation without rales, wheezing or rhonchi  ABDOMEN: Soft, non-tender, non-distended EXTREMITIES:  No edema; No deformity   ASSESSMENT AND PLAN:    Palpitations, suggestive of PVCs - Monitor with PACs,  PVCs, NSVT, and paroxysmal SVT (longest 28 seconds)   Asymmetric edema   Addison's disease on steroids   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  Encouraged lifestyle modification   ? If intermittent hypotension related to Addisons.  She feels better on higher dose of coreg and declines decrease or change to toprol.  She will reach out to PCP about decreasing Telmisartan/hydrochlorothiazide (? If she still needs hydrochlorothiazide on higher dose lasix)./  Follow up with Dr. Graciela Husbands in 12 months, sooner with issues.   Signed, Graciella Freer, PA-C

## 2022-08-06 ENCOUNTER — Encounter: Payer: Self-pay | Admitting: Student

## 2022-08-06 ENCOUNTER — Ambulatory Visit: Payer: Medicare Other | Attending: Student | Admitting: Student

## 2022-08-06 VITALS — BP 112/66 | HR 59 | Ht 65.0 in | Wt 286.0 lb

## 2022-08-06 DIAGNOSIS — R002 Palpitations: Secondary | ICD-10-CM | POA: Diagnosis not present

## 2022-08-06 DIAGNOSIS — I493 Ventricular premature depolarization: Secondary | ICD-10-CM

## 2022-08-06 DIAGNOSIS — I471 Supraventricular tachycardia, unspecified: Secondary | ICD-10-CM | POA: Diagnosis not present

## 2022-08-06 DIAGNOSIS — R079 Chest pain, unspecified: Secondary | ICD-10-CM

## 2022-08-06 DIAGNOSIS — R609 Edema, unspecified: Secondary | ICD-10-CM

## 2022-08-06 MED ORDER — CARVEDILOL 25 MG PO TABS: 25.0000 mg | ORAL_TABLET | Freq: Two times a day (BID) | ORAL | 3 refills | Status: DC

## 2022-08-06 NOTE — Patient Instructions (Signed)
Medication Instructions:  Your physician recommends that you continue on your current medications as directed. Please refer to the Current Medication list given to you today.  *If you need a refill on your cardiac medications before your next appointment, please call your pharmacy*  Lab Work: BMET-TODAY If you have labs (blood work) drawn today and your tests are completely normal, you will receive your results only by: MyChart Message (if you have MyChart) OR A paper copy in the mail If you have any lab test that is abnormal or we need to change your treatment, we will call you to review the results.  Follow-Up: At [facility], you and your health needs are our priority.  As part of our continuing mission to provide you with exceptional heart care, we have created designated Provider Care Teams.  These Care Teams include your primary Cardiologist (physician) and Advanced Practice Providers (APPs -  Physician Assistants and Nurse Practitioners) who all work together to provide you with the care you need, when you need it.  Your next appointment:   1 year(s)  Provider:   Sherryl Manges, MD

## 2022-08-07 LAB — BASIC METABOLIC PANEL
BUN/Creatinine Ratio: 20 (ref 12–28)
BUN: 19 mg/dL (ref 8–27)
CO2: 22 mmol/L (ref 20–29)
Calcium: 9.4 mg/dL (ref 8.7–10.3)
Chloride: 104 mmol/L (ref 96–106)
Creatinine, Ser: 0.95 mg/dL (ref 0.57–1.00)
Glucose: 99 mg/dL (ref 70–99)
Potassium: 3.8 mmol/L (ref 3.5–5.2)
Sodium: 143 mmol/L (ref 134–144)
eGFR: 65 mL/min/{1.73_m2} (ref >59)

## 2022-08-10 ENCOUNTER — Other Ambulatory Visit: Payer: Self-pay | Admitting: Urology

## 2022-08-10 ENCOUNTER — Telehealth: Payer: Self-pay

## 2022-08-10 ENCOUNTER — Other Ambulatory Visit: Payer: Self-pay | Admitting: Internal Medicine

## 2022-08-10 DIAGNOSIS — N390 Urinary tract infection, site not specified: Secondary | ICD-10-CM

## 2022-08-10 MED ORDER — AMOXICILLIN-POT CLAVULANATE 500-125 MG PO TABS
1.0000 | ORAL_TABLET | Freq: Two times a day (BID) | ORAL | 0 refills | Status: AC
Start: 2022-08-10 — End: 2022-08-17

## 2022-08-10 NOTE — Telephone Encounter (Signed)
Notified pt as advised. Pt expressed understanding and confirmed upcoming appt.

## 2022-08-10 NOTE — Telephone Encounter (Signed)
-----   Message from Milderd Meager, MD sent at 08/10/2022  1:13 PM EDT ----- Please notify the patient to begin Augmentin twice daily for treatment of a UTI found on her recent urine culture.  Prescription sent.  Keep follow-up as scheduled.

## 2022-08-12 ENCOUNTER — Other Ambulatory Visit: Payer: Self-pay | Admitting: Urology

## 2022-08-12 ENCOUNTER — Telehealth: Payer: Self-pay | Admitting: Urology

## 2022-08-12 MED ORDER — ESTRADIOL 0.1 MG/GM VA CREA: TOPICAL_CREAM | VAGINAL | 12 refills | Status: DC

## 2022-08-12 NOTE — Telephone Encounter (Signed)
Patient pharmacy called stating that her ins is not covering the script sent for vaginal cream. They have a handful of other options that are covered under her insurance.   Pharmacy phone # [phone]

## 2022-08-17 ENCOUNTER — Ambulatory Visit (HOSPITAL_BASED_OUTPATIENT_CLINIC_OR_DEPARTMENT_OTHER)
Admission: RE | Admit: 2022-08-17 | Discharge: 2022-08-17 | Disposition: A | Discharge status: Home / Self Care | Payer: Medicare Other | Source: Ambulatory Visit | Attending: Urology | Admitting: Urology

## 2022-08-17 DIAGNOSIS — N200 Calculus of kidney: Secondary | ICD-10-CM | POA: Diagnosis present

## 2022-08-21 ENCOUNTER — Encounter: Payer: Self-pay | Admitting: Urology

## 2022-08-24 ENCOUNTER — Other Ambulatory Visit: Payer: Medicare Other

## 2022-09-01 ENCOUNTER — Encounter: Payer: Self-pay | Admitting: Urology

## 2022-09-01 ENCOUNTER — Encounter: Payer: Self-pay | Admitting: Family

## 2022-09-01 ENCOUNTER — Ambulatory Visit (INDEPENDENT_AMBULATORY_CARE_PROVIDER_SITE_OTHER): Payer: Medicare Other | Admitting: Urology

## 2022-09-01 VITALS — BP 121/72 | HR 58

## 2022-09-01 DIAGNOSIS — Z87440 Personal history of urinary (tract) infections: Secondary | ICD-10-CM | POA: Diagnosis not present

## 2022-09-01 DIAGNOSIS — Z87442 Personal history of urinary calculi: Secondary | ICD-10-CM | POA: Diagnosis not present

## 2022-09-01 DIAGNOSIS — N390 Urinary tract infection, site not specified: Secondary | ICD-10-CM

## 2022-09-01 DIAGNOSIS — R8290 Unspecified abnormal findings in urine: Secondary | ICD-10-CM | POA: Diagnosis not present

## 2022-09-01 LAB — URINALYSIS, ROUTINE W REFLEX MICROSCOPIC
Bilirubin, UA: NEGATIVE
Glucose, UA: NEGATIVE
Ketones, UA: NEGATIVE
Nitrite, UA: NEGATIVE
Specific Gravity, UA: >1.03 — ABNORMAL HIGH (ref 1.005–1.030)
Urobilinogen, Ur: 0.2 mg/dL (ref 0.2–1.0)
pH, UA: 5.5 (ref 5.0–7.5)

## 2022-09-01 LAB — MICROSCOPIC EXAMINATION: WBC, UA: >30 /hpf — ABNORMAL HIGH (ref 0–5)

## 2022-09-01 NOTE — Progress Notes (Addendum)
Assessment: 1. Frequent UTI   2. History of nephrolithiasis   3. Abnormal urine findings     Plan: I personally reviewed the CT study from 08/21/2022 with results as noted below.  Results discussed with the patient.  There is no current evidence of nephrolithiasis. Urine culture sent today. Continue methods to reduce the risk of UTIs including increased fluid intake, timed and double voiding, daily cranberry supplement, daily probiotics, use of vaginal hormone cream. Continue vaginal hormone cream 2-3 times/week.  Consider addition of daily antibiotics for UTI prevention Information on prevention of kidney stones provided. Return to office in 1 month   Chief Complaint:  Chief Complaint  Patient presents with   Frequent UTI    History of Present Illness:  Connie Haynes is a 67 y.o. female who is seen for further evaluation of frequent UTI's and history of nephrolithiasis. She has been treated for frequent UTIs.  No typical UTI symptoms.  No dysuria or gross hematuria.  No fevers, chills, or flank pain.  She does occasionally notice some cloudy urine with a strong odor.  She was last treated for a UTI in May 2024.  Urine culture results: 6/21 50-100K E. Coli 6/22 <10K mixed flora 11/22 >100K Klebsiella 1/24 50-100K Enterobacter 3/24 <10K mixed flora 4/24 Mixed flora 5/24 10-49K Enterococcus 6/24 Mixed flora  She is unaware of the need to void.  She does not have a sensation of bladder fullness.  She voids on a schedule both daytime and nighttime.  She is not sure if she empties her bladder completely.  She has occasional episodes of incontinence without awareness.  These symptoms have been present since her diagnosis of transverse myelitis in 2021.  She has a history of kidney stones.  She required stent placement for a infected stone in 2015.  She reports passing a stone in June 2024.  PVR = 21 ml. Resolve MDX urine culture from 08/04/2022 grew Enterococcus and Klebsiella.   She was treated with Augmentin x 7 days. CT abdomen and pelvis without contrast from 08/21/2022 showed no renal or ureteral calculi, no evidence of obstruction or other urologic abnormalities.  She returns today for scheduled follow-up.  She noted some improvement in her voiding symptoms after completing the antibiotics.  She has resumed her regular voiding symptoms with hesitancy and sensation of incomplete emptying.  She also notes a decrease stream.  She does not have a awareness of her need to void so she continues to void on a schedule.  No dysuria or gross hematuria.  No flank pain.  No fevers or chills.  Portions of the above documentation were copied from a prior visit for review purposes only.   Past Medical History:  Past Medical History:  Diagnosis Date   Adrenal insufficiency (HCC)    Anemia    denies   Anxiety    Basal cell carcinoma    eye lid   Chronic kidney disease, stage 3 (HCC)    Cough    hx. dry cough chronic   Disorder of tear duct system    frequent tearing of eyes   GERD (gastroesophageal reflux disease)    H/O: whooping cough 2000   History of kidney infection 2015   ? sepsis   History of kidney stones    History of kidney stones    Hyperlipidemia    Hypertension    IBS (irritable bowel syndrome)    controlled with med   Osteoarthritis    arthritis -joints-limited ROM  shoulders more on right   PONV (postoperative nausea and vomiting)     per pt hx of low BP following shoulder surgery in 05-2015; was managed in hospital with IV fluids and d/c BP being taken a tthe time; pt was dced and F/U with endcrinoopgist who dx with adrenal insurffiency and started steroid therapy; treatmetnt with positive outome, now managed chronically on hydrocortisone tablets;   Sleep apnea    no longer using cpap   Stroke [facility])    per patient "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 Conni Slipper)   APPENDECTOMY     CARDIAC ELECTROPHYSIOLOGY STUDY AND ABLATION     '93 for WPW syndrome   CARPAL TUNNEL RELEASE Right 12/21/2014   Procedure: RIGHT CARPAL TUNNEL RELEASE ;  Surgeon: Beverely Low, MD;  Location: [facility] OR;  Service: Orthopedics;  Laterality: Right;   CARPAL TUNNEL RELEASE Left    CERVICAL SPINE SURGERY     cervical fusion with hardware retained   COLON SURGERY     "colon detached from abdomen"   COLONOSCOPY WITH PROPOFOL N/A 05/10/2014   Procedure: COLONOSCOPY WITH PROPOFOL;  Surgeon: Charna Elizabeth, MD;  [facility];  Service: Endoscopy;  Laterality: N/A;   CYSTOSCOPY/RETROGRADE/URETEROSCOPY Left 12/18/2013   Procedure: CYSTOSCOPY/RETROGRADE/LEFT STENT;  Surgeon: Anner Crete, MD;  [facility];  Service: Urology;  Laterality: Left;   ESOPHAGOGASTRODUODENOSCOPY (EGD) WITH PROPOFOL N/A 05/10/2014   Procedure: ESOPHAGOGASTRODUODENOSCOPY (EGD) WITH PROPOFOL;  Surgeon: Charna Elizabeth, MD;  [facility];  Service: Endoscopy;  Laterality: N/A;   EYELID CARCINOMA EXCISION     INSERTION OF DIALYSIS CATHETER N/A 08/16/2019   Procedure: INSERTION OF DIALYSIS CATHETER;  Surgeon: Josephine Igo, DO;  Location: [facility];  Service: Pulmonary;  Laterality: N/A;   JOINT REPLACEMENT     KNEE ARTHROSCOPY Left 07/16/2016   REVERSE SHOULDER ARTHROPLASTY Right 06/29/2014   Procedure: REVERSE TOTAL SHOULDER ARTHROPLASTY;  Surgeon: Beverely Low, MD;  Location: [facility] OR;  Service: Orthopedics;  Laterality: Right;   REVISION TOTAL SHOULDER TO REVERSE TOTAL SHOULDER Left 05/15/2015   REVISION TOTAL SHOULDER TO REVERSE TOTAL SHOULDER Left  05/15/2015   Procedure: LEFT SHOULDER REVISION TOTAL SHOULDER ARTHRPLASTY TO REVERSE TOTAL SHOULDER ARTHROPLASTY ;  Surgeon: Beverely Low, MD;  Location: [facility] OR;  Service: Orthopedics;  Laterality: Left;   tear duct surgery     TOTAL ABDOMINAL HYSTERECTOMY     TOTAL HIP ARTHROPLASTY     x 3(rt x2, lt x1)   TOTAL KNEE ARTHROPLASTY Right 08/08/2013   Procedure: RIGHT TOTAL KNEE ARTHROPLASTY WITH SAPHENOUS NERVE SCAR EXCISION;  Surgeon: Shelda Pal, MD;  Location: [facility];  Service: Orthopedics;  Laterality: Right;   TOTAL KNEE ARTHROPLASTY Left 08/25/2016   Procedure: LEFT TOTAL KNEE ARTHROPLASTY;  Surgeon: Durene Romans, MD;  Location: [facility];  Service: Orthopedics;  Laterality: Left;  70 mins; Adductor block   TOTAL SHOULDER ARTHROPLASTY Left 07/15/2012   Procedure: LEFT TOTAL SHOULDER ARTHROPLASTY;  Surgeon: Verlee Rossetti, MD;  Location: [facility] OR;  Service: Orthopedics;  Laterality: Left;   TOTAL SHOULDER REVISION Right 12/21/2014   Procedure: RIGHT SHOULDER POLY EXCHANGE ;  Surgeon: Beverely Low, MD;  Location: [facility] OR;  Service: Orthopedics;  Laterality: Right;   URETEROSCOPY  stent placement    Allergies:  Allergies  Allergen Reactions   Codeine Nausea And Vomiting    Can take if takes phernergan   Hydrocodone Nausea Only   Lamotrigine     "dizziness, shakiness and just feeling not right."   Robaxin [Methocarbamol] Rash    Family History:  Family History  Problem Relation Age of Onset   Leukemia Brother    Heart disease Mother    Hypertension Mother    Hyperlipidemia Mother    Heart attack Mother    Heart failure Mother    Heart disease Maternal Grandfather    Heart attack Maternal Grandfather    Cancer Paternal Grandfather    Lung cancer Paternal Grandfather    Cancer Maternal Uncle        bone    Social History:  Social History   Tobacco Use   Smoking status: Never   Smokeless tobacco: Never  Vaping Use   Vaping status: Never Used  Substance Use Topics   Alcohol  use: No    Alcohol/week: 0.0 standard drinks of alcohol   Drug use: No    ROS: Constitutional:  Negative for fever, chills, weight loss CV: Negative for chest pain, previous MI, hypertension Respiratory:  Negative for shortness of breath, wheezing, sleep apnea, frequent cough GI:  Negative for nausea, vomiting, bloody stool, GERD  Physical exam: BP 121/72   Pulse (!) 58  GENERAL APPEARANCE:  Well appearing, well developed, well nourished, NAD HEENT:  Atraumatic, normocephalic, oropharynx clear NECK:  Supple without lymphadenopathy or thyromegaly ABDOMEN:  Soft, non-tender, no masses EXTREMITIES:  Moves all extremities well, without clubbing, cyanosis, or edema NEUROLOGIC:  Alert and oriented x 3, normal gait, CN II-XII grossly intact MENTAL STATUS:  appropriate BACK:  Non-tender to palpation, No CVAT SKIN:  Warm, dry, and intact   Results: U/A:  >30 WBC, 0-2 RBC, many bacteria, nitrite neg

## 2022-09-02 ENCOUNTER — Ambulatory Visit (INDEPENDENT_AMBULATORY_CARE_PROVIDER_SITE_OTHER): Payer: Medicare Other | Admitting: *Deleted

## 2022-09-02 ENCOUNTER — Other Ambulatory Visit: Payer: Medicare Other

## 2022-09-02 DIAGNOSIS — Z0000 Encounter for general adult medical examination without abnormal findings: Secondary | ICD-10-CM

## 2022-09-02 DIAGNOSIS — Z780 Asymptomatic menopausal state: Secondary | ICD-10-CM

## 2022-09-02 NOTE — Progress Notes (Signed)
Subjective:   Connie Haynes is a 67 y.o. female who presents for an Initial Medicare Annual Wellness Visit.  Visit Complete: Virtual  I connected with  [name] on 09/02/22 by a audio enabled telemedicine application and verified that I am speaking with the correct person using two identifiers.  Patient Location: Home  Provider Location: Office/Clinic  I discussed the limitations of evaluation and management by telemedicine. The patient expressed understanding and agreed to proceed.  Patient Medicare AWV questionnaire was completed by the patient on 09/01/22; I have confirmed that all information answered by patient is correct and no changes since this date.  Review of Systems     Cardiac Risk Factors include: advanced age (>63men, >44 women);dyslipidemia;hypertension     Objective:    Per patient no change in vitals since last visit; unable to obtain new vitals due to this being a telehealth visit.      09/02/2022    1:52 PM 01/14/2021    8:05 AM 08/25/2016    3:50 PM 08/25/2016   11:02 AM 08/19/2016    9:37 AM 07/03/2015   11:40 AM 05/16/2015   12:46 PM  Advanced Directives  Does Patient Have a Medical Advance Directive? Yes Yes Yes Yes Yes Yes Yes  Type of Estate agent of [place];Living will Healthcare Power of [place];Living will Healthcare Power of [place];Living will Healthcare Power of [place];Living will Healthcare Power of [place];Living will Healthcare Power of [place];Living will Living will  Does patient want to make changes to medical advance directive? No - Patient declined  No - Patient declined  No - Patient declined No - Patient declined No - Patient declined  Copy of Healthcare Power of Attorney in Chart? No - copy requested Yes - validated most recent copy scanned in chart (See row information)     Yes    Current Medications (verified) Outpatient Encounter Medications as of 09/02/2022  Medication Sig   carvedilol (COREG) 25 MG  tablet Take 1 tablet (25 mg total) by mouth 2 (two) times daily.   citalopram (CELEXA) 40 MG tablet Take 1 tablet (40 mg total) by mouth daily.   estradiol (ESTRACE) 0.1 MG/GM vaginal cream Place 1 gm vaginally 2-3 times/week as directed   furosemide (LASIX) 40 MG tablet TAKE 2 TABLETS BY MOUTH EVERY OTHER DAY   hydrocortisone (CORTEF) 10 MG tablet Take 5-10 mg by mouth See admin instructions. Take 10mg  in the AM and 5mg  in the afternoon.   LINZESS 72 MCG capsule Take 1 capsule (72 mcg total) by mouth daily.   LORazepam (ATIVAN) 0.5 MG tablet One po qd prn   Multiple Vitamin (MULTIVITAMIN ADULT PO) Take 1 tablet by mouth daily.   ondansetron (ZOFRAN) 4 MG tablet Take 4 mg by mouth every 8 (eight) hours as needed.   potassium chloride (KLOR-CON) 10 MEQ tablet Take 10 mEq by mouth 2 (two) times daily.   pramipexole (MIRAPEX) 1 MG tablet TAKE 1 & 1/2 (ONE & ONE-HALF) TABLETS BY MOUTH AT BEDTIME   predniSONE (DELTASONE) 20 MG tablet Take 3 pills po the day before infusions.   riTUXimab (RITUXAN IV) Inject into the vein.   telmisartan-hydrochlorothiazide (MICARDIS HCT) 80-12.5 MG tablet Take 1 tablet by mouth daily.   tiZANidine (ZANAFLEX) 4 MG tablet TAKE 1 TO 1 & 1/2 (ONE & ONE-HALF) TABLETS BY MOUTH THREE TIMES DAILY AS NEEDED   traMADol (ULTRAM) 50 MG tablet TAKE 1 TABLET BY MOUTH EVERY 8 HOURS   [DISCONTINUED] neomycin-polymyxin-hydrocortisone (CORTISPORIN)  OTIC solution Place 3 drops into both ears 3 (three) times daily. (Patient not taking: Reported on 09/01/2022)   Facility-Administered Encounter Medications as of 09/02/2022  Medication   chlorhexidine (HIBICLENS) 4 % liquid 4 application   technetium tetrofosmin (TC-MYOVIEW) injection 31.7 milli Curie    Allergies (verified) Codeine, Hydrocodone, Lamotrigine, and Robaxin [methocarbamol]   History: Past Medical History:  Diagnosis Date   Adrenal insufficiency (HCC)    Anemia    denies   Anxiety    Basal cell carcinoma    eye lid    Chronic kidney disease, stage 3 (HCC)    Cough    hx. dry cough chronic   Disorder of tear duct system    frequent tearing of eyes   GERD (gastroesophageal reflux disease)    H/O: whooping cough 2000   History of kidney infection 2015   ? sepsis   History of kidney stones    History of kidney stones    Hyperlipidemia    Hypertension    IBS (irritable bowel syndrome)    controlled with med   Osteoarthritis    arthritis -joints-limited ROM shoulders more on right   PONV (postoperative nausea and vomiting)     per pt hx of low BP following shoulder surgery in 05-2015; was managed in hospital with IV fluids and d/c BP being taken a tthe time; pt was dced and F/U with endcrinoopgist who dx with adrenal insurffiency and started steroid therapy; treatmetnt with positive outome, now managed chronically on hydrocortisone tablets;   Sleep apnea    no longer using cpap   Stroke [facility])    per patient "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 Conni Slipper)   APPENDECTOMY     CARDIAC ELECTROPHYSIOLOGY STUDY AND ABLATION     '93 for WPW syndrome   CARPAL TUNNEL RELEASE Right 12/21/2014   Procedure: RIGHT CARPAL TUNNEL RELEASE ;  Surgeon: Beverely Low, MD;  Location: [facility] OR;  Service: Orthopedics;  Laterality: Right;   CARPAL TUNNEL RELEASE Left    CERVICAL SPINE SURGERY     cervical fusion with hardware retained   COLON SURGERY     "colon detached from abdomen"   COLONOSCOPY WITH PROPOFOL N/A 05/10/2014   Procedure: COLONOSCOPY WITH PROPOFOL;  Surgeon: Charna Elizabeth, MD;  Location: [facility] ENDOSCOPY;   Service: Endoscopy;  Laterality: N/A;   CYSTOSCOPY/RETROGRADE/URETEROSCOPY Left 12/18/2013   Procedure: CYSTOSCOPY/RETROGRADE/LEFT STENT;  Surgeon: Anner Crete, MD;  Location: [facility] ORS;  Service: Urology;  Laterality: Left;   ESOPHAGOGASTRODUODENOSCOPY (EGD) WITH PROPOFOL N/A 05/10/2014   Procedure: ESOPHAGOGASTRODUODENOSCOPY (EGD) WITH PROPOFOL;  Surgeon: Charna Elizabeth, MD;  Location: [facility] END[facility];  Service: Endoscopy;  Laterality: N/A;   EYE SURGERY     EYELID CARCINOMA EXCISION     INSERTION OF DIALYSIS CATHETER N/A 08/16/2019   Procedure: INSERTION OF DIALYSIS CATHETER;  Surgeon: Josephine Igo, DO;  Location: [facility] ENDOSCOPY;  Service: Pulmonary;  Laterality: N/A;   JOINT REPLACEMENT     KNEE ARTHROSCOPY Left 07/16/2016   REVERSE SHOULDER ARTHROPLASTY Right 06/29/2014   Procedure: REVERSE  TOTAL SHOULDER ARTHROPLASTY;  Surgeon: Beverely Low, MD;  Location: [facility] OR;  Service: Orthopedics;  Laterality: Right;   REVISION TOTAL SHOULDER TO REVERSE TOTAL SHOULDER Left 05/15/2015   REVISION TOTAL SHOULDER TO REVERSE TOTAL SHOULDER Left 05/15/2015   Procedure: LEFT SHOULDER REVISION TOTAL SHOULDER ARTHRPLASTY TO REVERSE TOTAL SHOULDER ARTHROPLASTY ;  Surgeon: Beverely Low, MD;  Location: [facility] OR;  Service: Orthopedics;  Laterality: Left;   SPINE SURGERY     tear duct surgery     TOTAL ABDOMINAL HYSTERECTOMY     TOTAL HIP ARTHROPLASTY     x 3(rt x2, lt x1)   TOTAL KNEE ARTHROPLASTY Right 08/08/2013   Procedure: RIGHT TOTAL KNEE ARTHROPLASTY WITH SAPHENOUS NERVE SCAR EXCISION;  Surgeon: Shelda Pal, MD;  Location: [facility] ORS;  Service: Orthopedics;  Laterality: Right;   TOTAL KNEE ARTHROPLASTY Left 08/25/2016   Procedure: LEFT TOTAL KNEE ARTHROPLASTY;  Surgeon: Durene Romans, MD;  Location: [facility];  Service: Orthopedics;  Laterality: Left;  70 mins; Adductor block   TOTAL SHOULDER ARTHROPLASTY Left 07/15/2012   Procedure: LEFT TOTAL SHOULDER ARTHROPLASTY;  Surgeon: Verlee Rossetti, MD;  Location[facility] OR;   Service: Orthopedics;  Laterality: Left;   TOTAL SHOULDER REVISION Right 12/21/2014   Procedure: RIGHT SHOULDER POLY EXCHANGE ;  Surgeon: Beverely Low, MD;  Location: [facility] OR;  Service: Orthopedics;  Laterality: Right;   URETEROSCOPY  stent placement   Family History  Problem Relation Age of Onset   Leukemia Brother    Heart disease Mother    Hypertension Mother    Hyperlipidemia Mother    Heart attack Mother    Heart failure Mother    Heart disease Maternal Grandfather    Heart attack Maternal Grandfather    Cancer Paternal Grandfather    Lung cancer Paternal Grandfather    Cancer Maternal Uncle        bone   Social History   Socioeconomic History   Marital status: Married    Spouse name: Sport and exercise psychologist   Number of children: 0   Years of education: Not on file   Highest education level: Not on file  Occupational History   Occupation: Retired     Associate Professor: [employer]  Tobacco Use   Smoking status: Never   Smokeless tobacco: Never  Vaping Use   Vaping status: Never Used  Substance and Sexual Activity   Alcohol use: No    Alcohol/week: 0.0 standard drinks of alcohol   Drug use: No   Sexual activity: Not Currently    Partners: Male  Other Topics Concern   Not on file  Social History Narrative   Marital Status:  Married Armed forces operational officer)   Living Situation: Lives with spouse   Occupation: Psychologist, 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   Tobacco Use/Exposure:  None   Alcohol Use: None   Drug Use:  None   Diet:  Low Fat/Low Carb   Exercise:  3-4 Days per week   Hobbies:  Reading, Travel   Left handed    Caffeine: 1 cup coffee every morning, diet coke (rare)   Social Determinants of Health   Financial Resource Strain: Low Risk  (09/01/2022)   Overall Financial Resource Strain (CARDIA)    Difficulty of Paying Living Expenses: Not very hard  Food Insecurity: No Food Insecurity (09/01/2022)   Hunger Vital Sign    Worried About Running Out of Food in the Last Year:  Never true    Ran Out of Food in the  Last Year: Never true  Transportation Needs: No Transportation Needs (09/01/2022)   PRAPARE - Administrator, Civil Service (Medical): No    Lack of Transportation (Non-Medical): No  Physical Activity: Insufficiently Active (09/01/2022)   Exercise Vital Sign    Days of Exercise per Week: 5 days    Minutes of Exercise per Session: 20 min  Stress: Stress Concern Present (09/01/2022)   [employer] of Occupational Health - Occupational Stress Questionnaire    Feeling of Stress : To some extent  Social Connections: Moderately Isolated (09/01/2022)   Social Connection and Isolation Panel [NHANES]    Frequency of Communication with Friends and Family: Twice a week    Frequency of Social Gatherings with Friends and Family: Twice a week    Attends Religious Services: Never    Diplomatic Services operational officer: No    Attends Engineer, structural: Never    Marital Status: Married    Tobacco Counseling Counseling given: Not Answered   Clinical Intake:  Pre-visit preparation completed: Yes  Pain : No/denies pain  Nutritional Risks: None Diabetes: No  How often do you need to have someone help you when you read instructions, pamphlets, or other written materials from your doctor or pharmacy?: 1 - Never  Interpreter Needed?: No  Information entered by :: [employer], CMA   Activities of Daily Living    09/01/2022   10:48 AM  In your present state of health, do you have any difficulty performing the following activities:  Hearing? 0  Vision? 1  Difficulty concentrating or making decisions? 1  Walking or climbing stairs? 1  Dressing or bathing? 0  Doing errands, shopping? 1  Comment husband Insurance claims handler and eating ? N  Using the Toilet? N  In the past six months, have you accidently leaked urine? Y  Do you have problems with loss of bowel control? N  Managing your Medications? N  Managing your  Finances? N  Housekeeping or managing your Housekeeping? N    Patient Care Team: Olive Bass, FNP as PCP - General (Internal Medicine) Duke Salvia, MD as PCP - Electrophysiology (Cardiology) Sater, Pearletha Furl, MD (Neurology)  Indicate any recent Medical Services you may have received from other than [facility] providers in the past year (date may be approximate).     Assessment:   This is a routine wellness examination for [place].  Hearing/Vision screen No results found.  Dietary issues and exercise activities discussed:     Goals Addressed   None    Depression Screen    09/02/2022    1:53 PM 06/04/2022   10:03 AM  PHQ 2/9 Scores  PHQ - 2 Score 2 0    Fall Risk    09/01/2022   10:48 AM 06/04/2022   10:02 AM  Fall Risk   Falls in the past year? 1 1  Number falls in past yr: 1 1  Injury with Fall? 0 0  Risk for fall due to : History of fall(s);Impaired balance/gait History of fall(s)  Follow up Falls evaluation completed Falls evaluation completed    MEDICARE RISK AT HOME:   TIMED UP AND GO:  Was the test performed? No    Cognitive Function:        09/02/2022    1:58 PM  6CIT Screen  What Year? 0 points  What month? 0 points  What time? 0 points  Count back from 20 0 points  Months in  reverse 0 points  Repeat phrase 0 points  Total Score 0 points    Immunizations Immunization History  Administered Date(s) Administered   Influenza Split 12/15/2001, 10/27/2012   Influenza Whole 11/10/2010   Influenza, High Dose Seasonal PF 11/07/2018, 11/07/2018, 11/29/2019   Influenza,inj,Quad PF,6+ Mos 10/04/2012, 11/06/2014, 10/29/2015, 10/21/2016, 11/10/2017   Influenza,inj,quad, With Preservative 10/28/2016   Influenza-Unspecified 12/15/2001, 10/04/2012, 10/10/2013, 11/06/2014, 10/29/2015, 10/21/2016, 11/10/2017   PFIZER Comirnaty(Gray Top)Covid-19 Tri-Sucrose Vaccine 10/01/2019   PFIZER(Purple Top)SARS-COV-2 Vaccination 03/13/2019, 04/03/2019   PPD  Test 08/13/2019   Pneumococcal Conjugate-13 02/21/2020   Pneumococcal Polysaccharide-23 12/20/2013   Td 08/02/2002, 08/02/2002   Tdap 05/06/2005, 12/29/2017    TDAP status: Up to date  Flu Vaccine status: Up to date  Pneumococcal vaccine status: Up to date  Covid-19 vaccine status: Information provided on how to obtain vaccines.   Qualifies for Shingles Vaccine? Yes   Zostavax completed No   Shingrix Completed?: No.    Education has been provided regarding the importance of this vaccine. Patient has been advised to call insurance company to determine out of pocket expense if they have not yet received this vaccine. Advised may also receive vaccine at local pharmacy or Health Dept. Verbalized acceptance and understanding.  Screening Tests Health Maintenance  Topic Date Due   Medicare Annual Wellness (AWV)  Never done   Hepatitis C Screening  Never done   Zoster Vaccines- Shingrix (1 of 2) Never done   DEXA SCAN  Never done   COVID-19 Vaccine (4 - 2023-24 season) 10/10/2021   INFLUENZA VACCINE  09/10/2022   MAMMOGRAM  09/06/2023   Colonoscopy  05/15/2024   Pneumonia Vaccine 72+ Years old (3 of 3 - PPSV23 or PCV20) 02/20/2025   DTaP/Tdap/Td (5 - Td or Tdap) 12/30/2027   HPV VACCINES  Aged Out    Health Maintenance  Health Maintenance Due  Topic Date Due   Medicare Annual Wellness (AWV)  Never done   Hepatitis C Screening  Never done   Zoster Vaccines- Shingrix (1 of 2) Never done   DEXA SCAN  Never done   COVID-19 Vaccine (4 - 2023-24 season) 10/10/2021    Colorectal cancer screening: Type of screening: Colonoscopy. Completed 05/16/14. Repeat every 10 years  Mammogram status: Completed 09/05/21. Repeat every year  Bone Density status: Ordered 09/02/22. Pt provided with contact info and advised to call to schedule appt.  Lung Cancer Screening: (Low Dose CT Chest recommended if Age 38-80 years, 20 pack-year currently smoking OR have quit w/in 15years.) does not qualify.    Additional Screening:  Hepatitis C Screening: does qualify; Completed N/a  Vision Screening: Recommended annual ophthalmology exams for early detection of glaucoma and other disorders of the eye. Is the patient up to date with their annual eye exam?  Yes  Who is the provider or what is the name of the office in which the patient attends annual eye exams? Dr. Cephus Richer If pt is not established with a provider, would they like to be referred to a provider to establish care? No .   Dental Screening: Recommended annual dental exams for proper oral hygiene  Diabetic Foot Exam: N/a  Community Resource Referral / Chronic Care Management: CRR required this visit?  No   CCM required this visit?  No     Plan:     I have personally reviewed and noted the following in the patient's chart:   Medical and social history Use of alcohol, tobacco or illicit drugs  Current medications and  supplements including opioid prescriptions. Patient is not currently taking opioid prescriptions. Functional ability and status Nutritional status Physical activity Advanced directives List of other physicians Hospitalizations, surgeries, and ER visits in previous 12 months Vitals Screenings to include cognitive, depression, and falls Referrals and appointments  In addition, I have reviewed and discussed with patient certain preventive protocols, quality metrics, and best practice recommendations. A written personalized care plan for preventive services as well as general preventive health recommendations were provided to patient.     Donne Anon, CMA   09/02/2022   After Visit Summary: (MyChart) Due to this being a telephonic visit, the after visit summary with patients personalized plan was offered to patient via MyChart   Nurse Notes: None

## 2022-09-02 NOTE — Patient Instructions (Signed)
[name] , Thank you for taking time to come for your Medicare Wellness Visit. I appreciate your ongoing commitment to your health goals. Please review the following plan we discussed and let me know if I can assist you in the future.   These are the goals we discussed:  Goals   None     This is a list of the screening recommended for you and due dates:  Health Maintenance  Topic Date Due   Hepatitis C Screening  Never done   Zoster (Shingles) Vaccine (1 of 2) Never done   DEXA scan (bone density measurement)  Never done   COVID-19 Vaccine (4 - 2023-24 season) 10/10/2021   Flu Shot  09/10/2022   Medicare Annual Wellness Visit  09/02/2023   Mammogram  09/06/2023   Colon Cancer Screening  05/15/2024   Pneumonia Vaccine (3 of 3 - PPSV23 or PCV20) 02/20/2025   DTaP/Tdap/Td vaccine (5 - Td or Tdap) 12/30/2027   HPV Vaccine  Aged Out     Next appointment: Follow up in one year for your annual wellness visit.   Preventive Care 69 Years and Older, Female Preventive care refers to lifestyle choices and visits with your health care provider that can promote health and wellness. What does preventive care include? A yearly physical exam. This is also called an annual well check. Dental exams once or twice a year. Routine eye exams. Ask your health care provider how often you should have your eyes checked. Personal lifestyle choices, including: Daily care of your teeth and gums. Regular physical activity. Eating a healthy diet. Avoiding tobacco and drug use. Limiting alcohol use. Practicing safe sex. Taking low-dose aspirin every day. Taking vitamin and mineral supplements as recommended by your health care provider. What happens during an annual well check? The services and screenings done by your health care provider during your annual well check will depend on your age, overall health, lifestyle risk factors, and family history of disease. Counseling  Your health care provider  may ask you questions about your: Alcohol use. Tobacco use. Drug use. Emotional well-being. Home and relationship well-being. Sexual activity. Eating habits. History of falls. Memory and ability to understand (cognition). Work and work Astronomer. Reproductive health. Screening  You may have the following tests or measurements: Height, weight, and BMI. Blood pressure. Lipid and cholesterol levels. These may be checked every 5 years, or more frequently if you are over 82 years old. Skin check. Lung cancer screening. You may have this screening every year starting at age 73 if you have a 30-pack-year history of smoking and currently smoke or have quit within the past 15 years. Fecal occult blood test (FOBT) of the stool. You may have this test every year starting at age 19. Flexible sigmoidoscopy or colonoscopy. You may have a sigmoidoscopy every 5 years or a colonoscopy every 10 years starting at age 65. Hepatitis C blood test. Hepatitis B blood test. Sexually transmitted disease (STD) testing. Diabetes screening. This is done by checking your blood sugar (glucose) after you have not eaten for a while (fasting). You may have this done every 1-3 years. Bone density scan. This is done to screen for osteoporosis. You may have this done starting at age 52. Mammogram. This may be done every 1-2 years. Talk to your health care provider about how often you should have regular mammograms. Talk with your health care provider about your test results, treatment options, and if necessary, the need for more tests. Vaccines  Your health care provider may recommend certain vaccines, such as: Influenza vaccine. This is recommended every year. Tetanus, diphtheria, and acellular pertussis (Tdap, Td) vaccine. You may need a Td booster every 10 years. Zoster vaccine. You may need this after age 76. Pneumococcal 13-valent conjugate (PCV13) vaccine. One dose is recommended after age 49. Pneumococcal  polysaccharide (PPSV23) vaccine. One dose is recommended after age 75. Talk to your health care provider about which screenings and vaccines you need and how often you need them. This information is not intended to replace advice given to you by your health care provider. Make sure you discuss any questions you have with your health care provider. Document Released: 02/22/2015 Document Revised: 10/16/2015 Document Reviewed: 11/27/2014 Elsevier Interactive Patient Education  2017 [employer].  Fall Prevention in the Home Falls can cause injuries. They can happen to people of all ages. There are many things you can do to make your home safe and to help prevent falls. What can I do on the outside of my home? Regularly fix the edges of walkways and driveways and fix any cracks. Remove anything that might make you trip as you walk through a door, such as a raised step or threshold. Trim any bushes or trees on the path to your home. Use bright outdoor lighting. Clear any walking paths of anything that might make someone trip, such as rocks or tools. Regularly check to see if handrails are loose or broken. Make sure that both sides of any steps have handrails. Any raised decks and porches should have guardrails on the edges. Have any leaves, snow, or ice cleared regularly. Use sand or salt on walking paths during winter. Clean up any spills in your garage right away. This includes oil or grease spills. What can I do in the bathroom? Use night lights. Install grab bars by the toilet and in the tub and shower. Do not use towel bars as grab bars. Use non-skid mats or decals in the tub or shower. If you need to sit down in the shower, use a plastic, non-slip stool. Keep the floor dry. Clean up any water that spills on the floor as soon as it happens. Remove soap buildup in the tub or shower regularly. Attach bath mats securely with double-sided non-slip rug tape. Do not have throw rugs and other  things on the floor that can make you trip. What can I do in the bedroom? Use night lights. Make sure that you have a light by your bed that is easy to reach. Do not use any sheets or blankets that are too big for your bed. They should not hang down onto the floor. Have a firm chair that has side arms. You can use this for support while you get dressed. Do not have throw rugs and other things on the floor that can make you trip. What can I do in the kitchen? Clean up any spills right away. Avoid walking on wet floors. Keep items that you use a lot in easy-to-reach places. If you need to reach something above you, use a strong step stool that has a grab bar. Keep electrical cords out of the way. Do not use floor polish or wax that makes floors slippery. If you must use wax, use non-skid floor wax. Do not have throw rugs and other things on the floor that can make you trip. What can I do with my stairs? Do not leave any items on the stairs. Make sure that there are  handrails on both sides of the stairs and use them. Fix handrails that are broken or loose. Make sure that handrails are as long as the stairways. Check any carpeting to make sure that it is firmly attached to the stairs. Fix any carpet that is loose or worn. Avoid having throw rugs at the top or bottom of the stairs. If you do have throw rugs, attach them to the floor with carpet tape. Make sure that you have a light switch at the top of the stairs and the bottom of the stairs. If you do not have them, ask someone to add them for you. What else can I do to help prevent falls? Wear shoes that: Do not have high heels. Have rubber bottoms. Are comfortable and fit you well. Are closed at the toe. Do not wear sandals. If you use a stepladder: Make sure that it is fully opened. Do not climb a closed stepladder. Make sure that both sides of the stepladder are locked into place. Ask someone to hold it for you, if possible. Clearly  mark and make sure that you can see: Any grab bars or handrails. First and last steps. Where the edge of each step is. Use tools that help you move around (mobility aids) if they are needed. These include: Canes. Walkers. Scooters. Crutches. Turn on the lights when you go into a dark area. Replace any light bulbs as soon as they burn out. Set up your furniture so you have a clear path. Avoid moving your furniture around. If any of your floors are uneven, fix them. If there are any pets around you, be aware of where they are. Review your medicines with your doctor. Some medicines can make you feel dizzy. This can increase your chance of falling. Ask your doctor what other things that you can do to help prevent falls. This information is not intended to replace advice given to you by your health care provider. Make sure you discuss any questions you have with your health care provider. Document Released: 11/22/2008 Document Revised: 07/04/2015 Document Reviewed: 03/02/2014 Elsevier Interactive Patient Education  2017 [employer].

## 2022-09-05 ENCOUNTER — Encounter: Payer: Self-pay | Admitting: Urology

## 2022-09-07 ENCOUNTER — Encounter: Payer: Self-pay | Admitting: Urology

## 2022-09-07 MED ORDER — SULFAMETHOXAZOLE-TRIMETHOPRIM 800-160 MG PO TABS
1.0000 | ORAL_TABLET | Freq: Two times a day (BID) | ORAL | 0 refills | Status: AC
Start: 2022-09-07 — End: 2022-09-14

## 2022-09-07 MED ORDER — NITROFURANTOIN MONOHYD MACRO 100 MG PO CAPS
100.0000 mg | ORAL_CAPSULE | Freq: Every day | ORAL | 2 refills | Status: DC
Start: 2022-09-07 — End: 2022-09-30

## 2022-09-07 NOTE — Addendum Note (Signed)
Addended by: Milderd Meager on: 09/07/2022 08:48 AM   Modules accepted: Orders

## 2022-09-10 ENCOUNTER — Encounter: Payer: Self-pay | Admitting: Family

## 2022-09-29 ENCOUNTER — Encounter: Payer: Self-pay | Admitting: Neurology

## 2022-09-30 ENCOUNTER — Ambulatory Visit: Payer: Medicare Other | Admitting: Urology

## 2022-09-30 ENCOUNTER — Encounter: Payer: Self-pay | Admitting: Urology

## 2022-09-30 VITALS — BP 117/64 | HR 59

## 2022-09-30 DIAGNOSIS — Z87440 Personal history of urinary (tract) infections: Secondary | ICD-10-CM

## 2022-09-30 DIAGNOSIS — Z87442 Personal history of urinary calculi: Secondary | ICD-10-CM

## 2022-09-30 DIAGNOSIS — N390 Urinary tract infection, site not specified: Secondary | ICD-10-CM

## 2022-09-30 DIAGNOSIS — Z09 Encounter for follow-up examination after completed treatment for conditions other than malignant neoplasm: Secondary | ICD-10-CM | POA: Diagnosis not present

## 2022-09-30 LAB — URINALYSIS, ROUTINE W REFLEX MICROSCOPIC
Bilirubin, UA: NEGATIVE
Glucose, UA: NEGATIVE
Ketones, UA: NEGATIVE
Leukocytes,UA: NEGATIVE
Nitrite, UA: NEGATIVE
Protein,UA: NEGATIVE
RBC, UA: NEGATIVE
Specific Gravity, UA: 1.025 (ref 1.005–1.030)
Urobilinogen, Ur: 0.2 mg/dL (ref 0.2–1.0)
pH, UA: 5 (ref 5.0–7.5)

## 2022-09-30 MED ORDER — METHENAMINE HIPPURATE 1 G PO TABS
1.0000 g | ORAL_TABLET | Freq: Two times a day (BID) | ORAL | 3 refills | Status: DC
Start: 2022-09-30 — End: 2023-01-26

## 2022-09-30 NOTE — Progress Notes (Signed)
Assessment: 1. Frequent UTI   2. History of nephrolithiasis     Plan: Continue methods to reduce the risk of UTIs including increased fluid intake, timed and double voiding, daily cranberry supplement, daily probiotics, use of vaginal hormone cream. Continue vaginal hormone cream 2-3 times/week.  Begin methenamine 1 g BID for UTI prevention.  Rx sent. Return to office in 6 weeks  Chief Complaint:  Chief Complaint  Patient presents with   Recurrent UTI    History of Present Illness:  Connie Haynes is a 67 y.o. female who is seen for further evaluation of frequent UTI's and history of nephrolithiasis. She has been treated for frequent UTIs.  No typical UTI symptoms.  No dysuria or gross hematuria.  No fevers, chills, or flank pain.  She does occasionally notice some cloudy urine with a strong odor.  She was last treated for a UTI in May 2024.  Urine culture results: 6/21 50-100K E. Coli 6/22 <10K mixed flora 11/22 >100K Klebsiella 1/24 50-100K Enterobacter 3/24 <10K mixed flora 4/24 Mixed flora 5/24 10-49K Enterococcus 6/24 Mixed flora  She is unaware of the need to void.  She does not have a sensation of bladder fullness.  She voids on a schedule both daytime and nighttime.  She is not sure if she empties her bladder completely.  She has occasional episodes of incontinence without awareness.  These symptoms have been present since her diagnosis of transverse myelitis in 2021.  She has a history of kidney stones.  She required stent placement for a infected stone in 2015.  She reports passing a stone in June 2024.  PVR = 21 ml. Resolve MDX urine culture from 08/04/2022 grew Enterococcus and Klebsiella.  She was treated with Augmentin x 7 days. CT abdomen and pelvis without contrast from 08/21/2022 showed no renal or ureteral calculi, no evidence of obstruction or other urologic abnormalities.  She noted some improvement in her voiding symptoms after completing the antibiotics.   She resumed her regular voiding symptoms with hesitancy and sensation of incomplete emptying.  She also noted a decreased stream.  She does not have a awareness of her need to void so she continues to void on a schedule.  No dysuria or gross hematuria.  No flank pain.  No fevers or chills. Urine culture from 09/01/2022 grew 50-100 K Enterobacter.  She was treated with Bactrim and started on daily Macrobid for UTI prevention. She discontinued the Macrobid due to nausea and vomiting.  She returns today for follow-up.  No new urinary symptoms.  She continues to have some intermittent stream, hesitancy, and straining to void without sensation to void.  No odor or color change to her urine.    Portions of the above documentation were copied from a prior visit for review purposes only.   Past Medical History:  Past Medical History:  Diagnosis Date   Adrenal insufficiency (HCC)    Anemia    denies   Anxiety    Basal cell carcinoma    eye lid   Chronic kidney disease, stage 3 (HCC)    Cough    hx. dry cough chronic   Disorder of tear duct system    frequent tearing of eyes   GERD (gastroesophageal reflux disease)    H/O: whooping cough 2000   History of kidney infection 2015   ? sepsis   History of kidney stones    History of kidney stones    Hyperlipidemia    Hypertension  IBS (irritable bowel syndrome)    controlled with med   Osteoarthritis    arthritis -joints-limited ROM shoulders more on right   PONV (postoperative nausea and vomiting)     per pt hx of low BP following shoulder surgery in 05-2015; was managed in hospital with IV fluids and d/c BP being taken a tthe time; pt was dced and F/U with endcrinoopgist who dx with adrenal insurffiency and started steroid therapy; treatmetnt with positive outome, now managed chronically on hydrocortisone tablets;   Sleep apnea    no longer using cpap   Stroke [facility])    per patient "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 Conni Slipper)   APPENDECTOMY     CARDIAC ELECTROPHYSIOLOGY STUDY AND ABLATION     '93 for WPW syndrome   CARPAL TUNNEL RELEASE Right 12/21/2014   Procedure: RIGHT CARPAL TUNNEL RELEASE ;  Surgeon: Beverely Low, MD;  Location: [facility] OR;  Service: Orthopedics;  Laterality: Right;   CARPAL TUNNEL RELEASE Left    CERVICAL SPINE SURGERY     cervical fusion with hardware retained   COLON SURGERY     "colon detached from abdomen"   COLONOSCOPY WITH PROPOFOL N/A 05/10/2014   Procedure: COLONOSCOPY WITH PROPOFOL;  Surgeon: Charna Elizabeth, MD;  Location: [facility] ENDOSCOPY;  Service: Endoscopy;  Laterality: N/A;   CYSTOSCOPY/RETROGRADE/URETEROSCOPY Left 12/18/2013   Procedure: CYSTOSCOPY/RETROGRADE/LEFT STENT;  Surgeon: Anner Crete, MD;  Location: [facility] ORS;  Service: Urology;  Laterality: Left;   ESOPHAGOGASTRODUODENOSCOPY (EGD) WITH PROPOFOL N/A 05/10/2014   Procedure: ESOPHAGOGASTRODUODENOSCOPY (EGD) WITH PROPOFOL;  Surgeon: Charna Elizabeth, MD;  Location: [facility] END[facility];  Service: Endoscopy;  Laterality: N/A;   EYE SURGERY     EYELID CARCINOMA EXCISION     INSERTION OF DIALYSIS CATHETER N/A 08/16/2019   Procedure: INSERTION OF DIALYSIS CATHETER;  Surgeon: Josephine Igo, DO;  Location: [facility] ENDOSCOPY;  Service: Pulmonary;  Laterality: N/A;   JOINT REPLACEMENT     KNEE ARTHROSCOPY Left 07/16/2016   REVERSE SHOULDER ARTHROPLASTY Right 06/29/2014   Procedure: REVERSE TOTAL SHOULDER ARTHROPLASTY;  Surgeon: Beverely Low, MD;  Location: [facility] OR;  Service: Orthopedics;  Laterality: Right;   REVISION TOTAL  SHOULDER TO REVERSE TOTAL SHOULDER Left 05/15/2015   REVISION TOTAL SHOULDER TO REVERSE TOTAL SHOULDER Left 05/15/2015   Procedure: LEFT SHOULDER REVISION TOTAL SHOULDER ARTHRPLASTY TO REVERSE TOTAL SHOULDER ARTHROPLASTY ;  Surgeon: Beverely Low, MD;  Location: [facility] OR;  Service: Orthopedics;  Laterality: Left;   SPINE SURGERY     tear duct surgery     TOTAL ABDOMINAL HYSTERECTOMY     TOTAL HIP ARTHROPLASTY     x 3(rt x2, lt x1)   TOTAL KNEE ARTHROPLASTY Right 08/08/2013   Procedure: RIGHT TOTAL KNEE ARTHROPLASTY WITH SAPHENOUS NERVE SCAR EXCISION;  Surgeon: Shelda Pal, MD;  Location: [facility] ORS;  Service: Orthopedics;  Laterality: Right;   TOTAL KNEE ARTHROPLASTY Left 08/25/2016   Procedure: LEFT TOTAL KNEE ARTHROPLASTY;  Surgeon: Durene Romans, MD;  Location: [facility] OR[facility];  Service: Orthopedics;  Laterality: Left;  70 mins; Adductor block   TOTAL SHOULDER ARTHROPLASTY Left 07/15/2012   Procedure: LEFT TOTAL SHOULDER ARTHROPLASTY;  Surgeon: Verlee Rossetti, MD;  Location[facility];  Service: Orthopedics;  Laterality: Left;   TOTAL SHOULDER REVISION Right 12/21/2014   Procedure: RIGHT SHOULDER POLY EXCHANGE ;  Surgeon: Beverely Low, MD;  Location[facility];  Service: Orthopedics;  Laterality: Right;   URETEROSCOPY  stent placement    Allergies:  Allergies  Allergen Reactions   Codeine Nausea And Vomiting    Can take if takes phernergan   Hydrocodone Nausea Only   Lamotrigine     "dizziness, shakiness and just feeling not right."   Macrobid [Nitrofurantoin] Diarrhea and Nausea And Vomiting   Robaxin [Methocarbamol] Rash    Family History:  Family History  Problem Relation Age of Onset   Leukemia Brother    Heart disease Mother    Hypertension Mother    Hyperlipidemia Mother    Heart attack Mother    Heart failure Mother    Heart disease Maternal Grandfather    Heart attack Maternal Grandfather    Cancer Paternal Grandfather    Lung cancer Paternal Grandfather    Cancer Maternal Uncle         bone    Social History:  Social History   Tobacco Use   Smoking status: Never   Smokeless tobacco: Never  Vaping Use   Vaping status: Never Used  Substance Use Topics   Alcohol use: No    Alcohol/week: 0.0 standard drinks of alcohol   Drug use: No    ROS: Constitutional:  Negative for fever, chills, weight loss CV: Negative for chest pain, previous MI, hypertension Respiratory:  Negative for shortness of breath, wheezing, sleep apnea, frequent cough GI:  Negative for nausea, vomiting, bloody stool, GERD  Physical exam: BP 117/64   Pulse (!) 59  GENERAL APPEARANCE:  Well appearing, well developed, well nourished, NAD HEENT:  Atraumatic, normocephalic, oropharynx clear NECK:  Supple without lymphadenopathy or thyromegaly ABDOMEN:  Soft, non-tender, no masses EXTREMITIES:  Moves all extremities well, without clubbing, cyanosis, or edema NEUROLOGIC:  Alert and oriented x 3, normal gait, CN II-XII grossly intact MENTAL STATUS:  appropriate BACK:  Non-tender to palpation, No CVAT SKIN:  Warm, dry, and intact   Results: U/A: negative

## 2022-10-09 LAB — HM DEXA SCAN

## 2022-10-13 ENCOUNTER — Encounter: Payer: Self-pay | Admitting: Family

## 2022-10-16 ENCOUNTER — Telehealth: Payer: Self-pay

## 2022-10-16 ENCOUNTER — Encounter: Payer: Self-pay | Admitting: Internal Medicine

## 2022-10-16 NOTE — Telephone Encounter (Signed)
   Name: Connie Haynes  DOB: [birth year]  MRN: [id number]  Primary Cardiologist: None   Preoperative team, please contact this patient and set up a phone call appointment for further preoperative risk assessment. Please obtain consent and complete medication review. Thank you for your help.  I confirm that guidance regarding antiplatelet and oral anticoagulation therapy has been completed and, if necessary, noted below. Not on anticoagulation or antiplatelet therapy.    Joni Reining, NP 10/16/2022, 1:04 PM [facility]

## 2022-10-16 NOTE — Telephone Encounter (Signed)
1st attempt to reach patient to schedule tele visit. Lvm

## 2022-10-16 NOTE — Telephone Encounter (Signed)
  Patient Consent for Virtual Visit         Connie Haynes has provided verbal consent on 10/16/2022 for a virtual visit (video or telephone).   CONSENT FOR VIRTUAL VISIT FOR:  Connie Haynes  By participating in this virtual visit I agree to the following:  I hereby voluntarily request, consent and authorize [facility] and its employed or contracted physicians, physician assistants, nurse practitioners or other licensed health care professionals (the Practitioner), to provide me with telemedicine health care services (the "Services") as deemed necessary by the treating Practitioner. I acknowledge and consent to receive the Services by the Practitioner via telemedicine. I understand that the telemedicine visit will involve communicating with the Practitioner through live audiovisual communication technology and the disclosure of certain medical information by electronic transmission. I acknowledge that I have been given the opportunity to request an in-person assessment or other available alternative prior to the telemedicine visit and am voluntarily participating in the telemedicine visit.  I understand that I have the right to withhold or withdraw my consent to the use of telemedicine in the course of my care at any time, without affecting my right to future care or treatment, and that the Practitioner or I may terminate the telemedicine visit at any time. I understand that I have the right to inspect all information obtained and/or recorded in the course of the telemedicine visit and may receive copies of available information for a reasonable fee.  I understand that some of the potential risks of receiving the Services via telemedicine include:  Delay or interruption in medical evaluation due to technological equipment failure or disruption; Information transmitted may not be sufficient (e.g. poor resolution of images) to allow for appropriate medical decision making by the Practitioner;  and/or  In rare instances, security protocols could fail, causing a breach of personal health information.  Furthermore, I acknowledge that it is my responsibility to provide information about my medical history, conditions and care that is complete and accurate to the best of my ability. I acknowledge that Practitioner's advice, recommendations, and/or decision may be based on factors not within their control, such as incomplete or inaccurate data provided by me or distortions of diagnostic images or specimens that may result from electronic transmissions. I understand that the practice of medicine is not an exact science and that Practitioner makes no warranties or guarantees regarding treatment outcomes. I acknowledge that a copy of this consent can be made available to me via my patient portal [facility] MyChart), or I can request a printed copy by calling the office of [facility].    I understand that my insurance will be billed for this visit.   I have read or had this consent read to me. I understand the contents of this consent, which adequately explains the benefits and risks of the Services being provided via telemedicine.  I have been provided ample opportunity to ask questions regarding this consent and the Services and have had my questions answered to my satisfaction. I give my informed consent for the services to be provided through the use of telemedicine in my medical care

## 2022-10-16 NOTE — Telephone Encounter (Signed)
Patient scheduled for tele visit on 10/29/22. Med rec and consent done

## 2022-10-16 NOTE — Telephone Encounter (Signed)
   Pre-operative Risk Assessment    Patient Name: Connie Haynes  DOB: [birth year] MRN: [id number]      Request for Surgical Clearance    Procedure:   RIGHT SHOULDER REVISION REVERSE TOTAL SHOULDER ARTHROPLASTY  Date of Surgery:  Clearance TBD                                 Surgeon:  DR. Malon Kindle Surgeon's Group or Practice Name:  [facility] Phone number:  [phone] KERRI MAZE  Fax number:  [phone]   Type of Clearance Requested:   - Medical    Type of Anesthesia:   CHOICE    Additional requests/questions:    SignedMichaelle Copas   10/16/2022, 11:14 AM

## 2022-10-19 NOTE — Telephone Encounter (Signed)
Pt has an appt for pre op clearance

## 2022-10-20 ENCOUNTER — Other Ambulatory Visit: Payer: Self-pay | Admitting: Orthopedic Surgery

## 2022-10-20 ENCOUNTER — Encounter: Payer: Self-pay | Admitting: Neurology

## 2022-10-20 ENCOUNTER — Ambulatory Visit (INDEPENDENT_AMBULATORY_CARE_PROVIDER_SITE_OTHER): Payer: Medicare Other | Admitting: Neurology

## 2022-10-20 VITALS — BP 125/74 | HR 61 | Ht 65.0 in | Wt 291.5 lb

## 2022-10-20 DIAGNOSIS — G369 Acute disseminated demyelination, unspecified: Secondary | ICD-10-CM | POA: Diagnosis not present

## 2022-10-20 DIAGNOSIS — Z79899 Other long term (current) drug therapy: Secondary | ICD-10-CM

## 2022-10-20 DIAGNOSIS — Z8669 Personal history of other diseases of the nervous system and sense organs: Secondary | ICD-10-CM

## 2022-10-20 DIAGNOSIS — Z96611 Presence of right artificial shoulder joint: Secondary | ICD-10-CM

## 2022-10-20 DIAGNOSIS — R269 Unspecified abnormalities of gait and mobility: Secondary | ICD-10-CM

## 2022-10-20 MED ORDER — PRAMIPEXOLE DIHYDROCHLORIDE 1 MG PO TABS: ORAL_TABLET | ORAL | 3 refills | Status: DC

## 2022-10-20 MED ORDER — TRAMADOL HCL 50 MG PO TABS: 50.0000 mg | ORAL_TABLET | Freq: Three times a day (TID) | ORAL | 2 refills | Status: DC | PRN

## 2022-10-20 NOTE — Progress Notes (Signed)
[facility]  PATIENT: Connie Haynes DOB: 10/15/1955  REFERRING DOCTOR OR PCP: Tresa Moore, MD SOURCE: Patient, notes from recent hospitalization, imaging and laboratory reports, MRI images personally reviewed.  _________________________________   HISTORICAL  CHIEF COMPLAINT:  Chief Complaint  Patient presents with   Follow-up    Pt in room 11. Here for Neuromyelitis. Pt reports legs are feeling numb, left leg numbness seem worse. Pt reports history of recurrent UTI' this year. Pt said RLS is not any better. Pt no longer uses cpap machine.    HISTORY OF PRESENT ILLNESS:  Connie Haynes is a 67 y.o. 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  She had been stable and tolerates the infusion well.    Last Rituxan dose is May 21, 2022 and we did 500 mg due to continued low IgG.   Due to itching, she is taking prednisone the day befpre the infusion now with benefit.     Around the time of the last visit there was concern of exacerbation and we checked imaging studies.  The transverse myelitis focus visible on MRIs.  The demyelinating focus in the brain is reduced in size.  She notes more numbness and sometimes left leg weakness that seems worse towards the end of each cycle.     She has had a few falls due to both balance and strength.   Sometimes trouble getting up from the chair.   She is unable to lift herself up if she is on the ground.      Her LETM and left ON occurred within a few days.  Additionally, she had a demyelinating focus within the brain.  The vision change noticed shortly after presenting to the hospital for her leg/groin numbness,     She had a Covid vaccination 4 months earlier but nothing within a month.      She has absent sensation below the hips.Arms are not numb.   . She feels weak in legs.   Due to the numness, she needs to see her feet as she walks     She  has dysesthesias on legs but gabapentin made her feel very strange.     She has a left altitudinal visual field cut (confirmed on recent [facility] VF testing with ophth).  She feels that this is stable  She is on steroid for adrenal insufficiency and had a recent crisis during an infection.     Bladder issues continue.   She will feel some pressure when the bladder is full but not every time.   Similar with bowel.   Scheduled urination and defecation may help some.  Hesitancy is a problem.     She does not think she has had UTI.     Repeat spine imaging MRIs 07/23/2020 compared them to the previous ones.  The MRIs look better showing no new lesions and fairly complete resolution of the enhancing focus that had extended from the mid cervical spine to the upper thoracic spine.  She has had adrenal insufficiency dx in 2018 and is on Cortef.    She has more neck pain since almost falling and catching self.   Orthopedics would like to do a myelogram (as she has ACDF artifact on MRI.   She is concerend due to h/o mild CRF (creatinine is 1.06 now)   LETM/NMOSD HISTORY In late June 2021, she had the onset of  urinary retention and the next morning had numbness from the waist down.   She initially presented to the ED but left due to the long wait.   She re-presented to [facility] emergency room when she had worsening with more intense numbness, weakness and reduced gait.  MRIs showed a longitudinal enhancing lesion in the lower cervical and upper thoracic spine (C4-T4) and the brain showed 2 foci, one that enhanced near the basal ganglia on the right.    She was admitted for further evaluation and treatment.  She had high dose steroids for 5 days but did not note a benefit.  She then had 5 treatments of plasma exchange..    Strength improved 08/22/2019 and she was able to take a few steps.   She was doing some physical therapy while in the hospital.  By the time of discharge, she was able to use a walker.  She  improved further over the next month but continued to have numbness and reduced gait.      We received the results of the [facility] anti-NMO and anti-MOG test.  Both were negative.  MRIs showed a longitudinal enhancing lesion in the lower cervical and upper thoracic spine (C4-T4) and the brain showed 2 foci, one that enhanced near the basal ganglia on the right.  We had discussed that, despite that result, I am very concerned that she is at an elevated risk of a relapse.  Besides the inflammatory transverse myelitis, she also had an enhancing lesion in the brain.  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  Other tests including vasculitis labs were negative though CNS vasculitis would not always show up in blood work.  She has adrenal insufficiency and is on Cortef.  She also has stage 2 CKD, HTN, WPW (S/p ablation).  I personally reviewed the MRIs of the lumbar spine, thoracic spine, cervical spine and brain performed 08/10/2019 and 08/11/2019.  She had increased numbness in legs 07/2020.     Imaging review: MRI brain 08/11/2019: There is a 9 mm enhancing focus in the inferior right frontal lobe associated with some mass-effect.  Additionally, there is a small left T2/flair hyperintense focus just above the left internal capsule.  The etiology of the focus lesion is uncertain.  It could represent a focus of demyelination.  Neoplasm cannot be completely ruled out.  The more chronic appearing focus could be demyelinating or ischemic  MRI of the cervical spine and thoracic spine 08/10/2019: Central to posterior enhancing focus within the spinal cord from C4-T4.  This causes some enlargement of the cord.  Additionally there is ACDF at C5-C7 and DJD at C3-C4 and C4-C5.  MRI of the lumbar spine 08/10/2019: There is anterolisthesis of L3 upon L4 associated with facet hypertrophy and protrusion of the uncovered disc.  There is foraminal narrowing, more on the right.  Milder degenerative changes are noted at  some of the lumbar levels but no spinal stenosis or significant nerve root compression.  MRI of the cervical and thoracic spine 01/10/2020 showed greatly improved appearance of the abnormal signal.  On this MRI, there is just minimal T2 hyperintensity adjacent to C6-C7 and mild abnormal enhancement posteriorly adjacent to C7.  MRI of the brain 01/10/2020 showed interval improvement of the enhancing focus in the inferior right frontal lobe that have been seen on the 08/11/2019 MRI.  There was still a small focus of enhancement in that region.  Several other T2/FLAIR hyperintense foci in the hemispheres are  more consistent with minimal stable chronic microvascular ischemic change.  MRI of the cervical and thoracic spine 07/23/2020 showed complete resolution of the enhancing focus from C4-T4 that has been seen on previous MRIs.   However, there appears to be mild atrophy at C7-T1.    MRI of the brain 10/05/2021 showed a right inferior frontal/medial temporal focus that is hyperintense on T2 weighted and FLAIR images consistent with remote demyelination.  In 2021, this focus was larger, associated with edema and enhanced.  It no longer is associated with edema or enhances.   Remote lacunar infarction adjacent to the basal ganglia on the left, unchanged compared to the previous MRI.  Few scattered T2/FLAIR hyperintense foci in the hemispheres consistent with minimal age appropriate chronic microvascular ischemic changes.  MRI of the cervical and thoracic spine 822/2023 showed resolution of the C4-T4 lesion noted on the previous MRIs.  Laboratory review While in the hospital 08/2019, she had a LP (CSF was normal except slightly elevated protein); NMO Ab (negative), anti-MOG (negative).  Additionally she had ANCA, HIV, TB, B12, rheumatoid factor, anti-Jo 1, ENA, HSV, ACE.  These were all negative or normal.    July 2021:  Hep B cAb, Hep B sAg, Hep B sAb all negative.    ANA was negative  Repeat [facility] anti-Mog and  anti-NMO were negative 09/2019   REVIEW OF SYSTEMS: Constitutional: No fevers, chills, sweats, or change in appetite Eyes: No visual changes, double vision, eye pain Ear, nose and throat: No hearing loss, ear pain, nasal congestion, sore throat Cardiovascular: No chest pain, palpitations Respiratory:  No shortness of breath at rest or with exertion.   No wheezes GastrointestinaI: No nausea, vomiting, diarrhea, abdominal pain, fecal incontinence Genitourinary:  No dysuria, urinary retention or frequency.  No nocturia. Musculoskeletal:  No neck pain, back pain Integumentary: No rash, pruritus, skin lesions Neurological: as above Psychiatric: No depression at this time.  No anxiety Endocrine: No palpitations, diaphoresis, change in appetite, change in weigh or increased thirst Hematologic/Lymphatic:  No anemia, purpura, petechiae. Allergic/Immunologic: No itchy/runny eyes, nasal congestion, recent allergic reactions, rashes  ALLERGIES: Allergies  Allergen Reactions   Codeine Nausea And Vomiting    Can take if takes phernergan   Hydrocodone Nausea Only   Lamotrigine     "dizziness, shakiness and just feeling not right."   Macrobid [Nitrofurantoin] Diarrhea and Nausea And Vomiting   Robaxin [Methocarbamol] Rash    HOME MEDICATIONS:  Current Outpatient Medications:    carvedilol (COREG) 25 MG tablet, Take 1 tablet (25 mg total) by mouth 2 (two) times daily., Disp: 180 tablet, Rfl: 3   citalopram (CELEXA) 40 MG tablet, Take 1 tablet (40 mg total) by mouth daily., Disp: 90 tablet, Rfl: 3   furosemide (LASIX) 40 MG tablet, TAKE 2 TABLETS BY MOUTH EVERY OTHER DAY, Disp: 90 tablet, Rfl: 3   hydrocortisone (CORTEF) 10 MG tablet, Take 5-10 mg by mouth See admin instructions. Take 10mg  in the AM and 5mg  in the afternoon., Disp: , Rfl:    LINZESS 72 MCG capsule, Take 1 capsule (72 mcg total) by mouth daily., Disp: 90 capsule, Rfl: 3   LORazepam (ATIVAN) 0.5 MG tablet, One po qd prn, Disp: 30  tablet, Rfl: 5   methenamine (HIPREX) 1 g tablet, Take 1 tablet (1 g total) by mouth 2 (two) times daily with a meal., Disp: 60 tablet, Rfl: 3   Multiple Vitamin (MULTIVITAMIN ADULT PO), Take 1 tablet by mouth daily., Disp: , Rfl:  ondansetron (ZOFRAN) 4 MG tablet, Take 4 mg by mouth every 8 (eight) hours as needed., Disp: , Rfl:    potassium chloride (KLOR-CON) 10 MEQ tablet, Take 10 mEq by mouth as needed. WITH LASIX, Disp: , Rfl:    predniSONE (DELTASONE) 20 MG tablet, Take 3 pills po the day before infusions., Disp: 12 tablet, Rfl: 0   riTUXimab (RITUXAN IV), Inject into the vein., Disp: , Rfl:    telmisartan-hydrochlorothiazide (MICARDIS HCT) 80-12.5 MG tablet, Take 1 tablet by mouth daily., Disp: 90 tablet, Rfl: 3   tiZANidine (ZANAFLEX) 4 MG tablet, TAKE 1 TO 1 & 1/2 (ONE & ONE-HALF) TABLETS BY MOUTH THREE TIMES DAILY AS NEEDED, Disp: 270 tablet, Rfl: 1   estradiol (ESTRACE) 0.1 MG/GM vaginal cream, Place 1 gm vaginally 2-3 times/week as directed (Patient not taking: Reported on 10/16/2022), Disp: 42.5 g, Rfl: 12   pramipexole (MIRAPEX) 1 MG tablet, TAKE 1 PO BID, Disp: 180 tablet, Rfl: 3   traMADol (ULTRAM) 50 MG tablet, Take 1 tablet (50 mg total) by mouth every 8 (eight) hours as needed., Disp: 90 tablet, Rfl: 2 No current facility-administered medications for this visit.  Facility-Administered Medications Ordered in Other Visits:    chlorhexidine (HIBICLENS) 4 % liquid 4 application, 60 mL, Topical, Once, Dixon, Katharina Caper, PA-C   technetium tetrofosmin (TC-MYOVIEW) injection 31.7 milli Curie, 31.7 millicurie, Intravenous, Once PRN, Lars Masson, MD  PAST MEDICAL HISTORY: Past Medical History:  Diagnosis Date   Adrenal insufficiency (HCC)    Anemia    denies   Anxiety    Basal cell carcinoma    eye lid   Chronic kidney disease, stage 3 (HCC)    Cough    hx. dry cough chronic   Disorder of tear duct system    frequent tearing of eyes   GERD (gastroesophageal reflux  disease)    H/O: whooping cough 2000   History of kidney infection 2015   ? sepsis   History of kidney stones    History of kidney stones    Hyperlipidemia    Hypertension    IBS (irritable bowel syndrome)    controlled with med   Osteoarthritis    arthritis -joints-limited ROM shoulders more on right   PONV (postoperative nausea and vomiting)     per pt hx of low BP following shoulder surgery in 05-2015; was managed in hospital with IV fluids and d/c BP being taken a tthe time; pt was dced and F/U with endcrinoopgist who dx with adrenal insurffiency and started steroid therapy; treatmetnt with positive outome, now managed chronically on hydrocortisone tablets;   Sleep apnea    no longer using cpap   Stroke [facility])    per patient "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 Conni Slipper)   APPENDECTOMY     CARDIAC ELECTROPHYSIOLOGY STUDY AND ABLATION     '93 for WPW syndrome   CARPAL TUNNEL RELEASE Right 12/21/2014   Procedure: RIGHT CARPAL TUNNEL RELEASE ;  Surgeon: Beverely Low, MD;  Location: [facility] OR;  Service:  Orthopedics;  Laterality: Right;   CARPAL TUNNEL RELEASE Left    CERVICAL SPINE SURGERY     cervical fusion with hardware retained   COLON SURGERY     "colon detached from abdomen"   COLONOSCOPY WITH PROPOFOL N/A 05/10/2014   Procedure: COLONOSCOPY WITH PROPOFOL;  Surgeon: Charna Elizabeth, MD;  Location: [facility]COPY;  Service: Endoscopy;  Laterality: N/A;   CYSTOSCOPY/RETROGRADE/URETEROSCOPY Left 12/18/2013   Procedure: CYSTOSCOPY/RETROGRADE/LEFT STENT;   Surgeon: Anner Crete, MD;  Location: [facility]S;  Service: Urology;  Laterality: Left;   ESOPHAGOGASTRODUODENOSCOPY (EGD) WITH PROPOFOL N/A 05/10/2014   Procedure: ESOPHAGOGASTRODUODENOSCOPY (EGD) WITH PROPOFOL;  Surgeon: Charna Elizabeth, MD;  Location: [facility];  Service: Endoscopy;  Laterality: N/A;   EYE SURGERY     EYELID CARCINOMA EXCISION     INSERTION OF DIALYSIS CATHETER N/A 08/16/2019   Procedure: INSERTION OF DIALYSIS CATHETER;  Surgeon: Josephine Igo, DO;  Location: [facility];  Service: Pulmonary;  Laterality: N/A;   JOINT REPLACEMENT     KNEE ARTHROSCOPY Left 07/16/2016   REVERSE SHOULDER ARTHROPLASTY Right 06/29/2014   Procedure: REVERSE TOTAL SHOULDER ARTHROPLASTY;  Surgeon: Beverely Low, MD;  Location: [facility] OR;  Service: Orthopedics;  Laterality: Right;   REVISION TOTAL SHOULDER TO REVERSE TOTAL SHOULDER Left 05/15/2015   REVISION TOTAL SHOULDER TO REVERSE TOTAL SHOULDER Left 05/15/2015   Procedure: LEFT SHOULDER REVISION TOTAL SHOULDER ARTHRPLASTY TO REVERSE TOTAL SHOULDER ARTHROPLASTY ;  Surgeon: Beverely Low, MD;  Location: [facility] OR;  Service: Orthopedics;  Laterality: Left;   SPINE SURGERY     tear duct surgery     TOTAL ABDOMINAL HYSTERECTOMY     TOTAL HIP ARTHROPLASTY     x 3(rt x2, lt x1)   TOTAL KNEE ARTHROPLASTY Right 08/08/2013   Procedure: RIGHT TOTAL KNEE ARTHROPLASTY WITH SAPHENOUS NERVE SCAR EXCISION;  Surgeon: Shelda Pal, MD;  Location: [facility];  Service: Orthopedics;  Laterality: Right;   TOTAL KNEE ARTHROPLASTY Left 08/25/2016   Procedure: LEFT TOTAL KNEE ARTHROPLASTY;  Surgeon: Durene Romans, MD;  Location: [facility];  Service: Orthopedics;  Laterality: Left;  70 mins; Adductor block   TOTAL SHOULDER ARTHROPLASTY Left 07/15/2012   Procedure: LEFT TOTAL SHOULDER ARTHROPLASTY;  Surgeon: Verlee Rossetti, MD;  Location: [facility] OR;  Service: Orthopedics;  Laterality: Left;   TOTAL SHOULDER REVISION Right 12/21/2014   Procedure: RIGHT SHOULDER POLY EXCHANGE ;  Surgeon:  Beverely Low, MD;  Location: [facility] OR;  Service: Orthopedics;  Laterality: Right;   URETEROSCOPY  stent placement    FAMILY HISTORY: Family History  Problem Relation Age of Onset   Leukemia Brother    Heart disease Mother    Hypertension Mother    Hyperlipidemia Mother    Heart attack Mother    Heart failure Mother    Heart disease Maternal Grandfather    Heart attack Maternal Grandfather    Cancer Paternal Grandfather    Lung cancer Paternal Grandfather    Cancer Maternal Uncle        bone    SOCIAL HISTORY:  Social History   Socioeconomic History   Marital status: Married    Spouse name: Sport and exercise psychologist   Number of children: 0   Years of education: Not on file   Highest education level: Not on file  Occupational History   Occupation: Retired     Associate Professor: [employer]  Tobacco Use   Smoking status: Never   Smokeless tobacco: Never  Vaping Use   Vaping status: Never Used  Substance and  Sexual Activity   Alcohol use: No    Alcohol/week: 0.0 standard drinks of alcohol   Drug use: No   Sexual activity: Not Currently    Partners: Male  Other Topics Concern   Not on file  Social History Narrative   Marital Status:  Married Armed forces operational officer)   Living Situation: Lives with spouse   Occupation: Psychologist, 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   Tobacco Use/Exposure:  None   Alcohol Use: None   Drug Use:  None   Diet:  Low Fat/Low Carb   Exercise:  3-4 Days per week   Hobbies:  Reading, Travel   Left handed    Caffeine: 1 cup coffee every morning, diet coke (rare)   Social Determinants of Health   Financial Resource Strain: Low Risk  (09/01/2022)   Overall Financial Resource Strain (CARDIA)    Difficulty of Paying Living Expenses: Not very hard  Food Insecurity: No Food Insecurity (09/01/2022)   Hunger Vital Sign    Worried About Running Out of Food in the Last Year: Never true    Ran Out of Food in the Last Year: Never true  Transportation Needs: No Transportation  Needs (09/01/2022)   PRAPARE - Administrator, Civil Service (Medical): No    Lack of Transportation (Non-Medical): No  Physical Activity: Insufficiently Active (09/01/2022)   Exercise Vital Sign    Days of Exercise per Week: 5 days    Minutes of Exercise per Session: 20 min  Stress: Stress Concern Present (09/01/2022)   [employer] of Occupational Health - Occupational Stress Questionnaire    Feeling of Stress : To some extent  Social Connections: Moderately Isolated (09/01/2022)   Social Connection and Isolation Panel [NHANES]    Frequency of Communication with Friends and Family: Twice a week    Frequency of Social Gatherings with Friends and Family: Twice a week    Attends Religious Services: Never    Database administrator or Organizations: No    Attends Banker Meetings: Never    Marital Status: Married  Catering manager Violence: Not At Risk (09/02/2022)   Humiliation, Afraid, Rape, and Kick questionnaire    Fear of Current or Ex-Partner: No    Emotionally Abused: No    Physically Abused: No    Sexually Abused: No     PHYSICAL EXAM  Vitals:   10/20/22 1514  BP: 125/74  Pulse: 61  Weight: 291 lb 8 oz (132.2 kg)  Height: 5\' 5"  (1.651 m)    Body mass index is 48.51 kg/m.   General: The patient is well-developed and well-nourished and in no acute distress  HEENT:  Head is McDonald/AT.  Sclera are anicteric.   Neck: No carotid bruits are noted.  The neck is tender in lower cervical paraspinal muscles  Skin: Extremities are without rash or edema.  Neurologic Exam  Mental status: The patient is alert and oriented x 3 at the time of the examination. The patient has apparent normal recent and remote memory, with an apparently normal attention span and concentration ability.   Speech is normal.  Cranial nerves: Extraocular movements are full.  Color vision is reduced OS, VA is reduced OS but she can still read large print  Facial symmetry is  present. There is good facial sensation to soft touch bilaterally.Facial strength is normal.  Trapezius and sternocleidomastoid strength is normal. No dysarthria is noted.   No obvious hearing deficits are noted.  Motor:  Muscle bulk is normal.   Tone is normal.  Strength is 4+/5 deltoids, 5 biceps and 4+/5 left tricep, 3 to 4 -/5 in proximal legs, 4/5 with quadriceps and 4 -/5 in the feet and ankles  Sensory: She has absent vibration sensation in the legs and absent touch/ temperaturein in left leg and reduced in right legs and lower flanks to lower chest, left worse than right in trunk.   Slight reduced vibration sensation in 5th fingers relative to 2nd fingers.    Coordination: Cerebellar testing reveals good finger-nose-finger.  She has a poor heel-to-shin.    Gait and station: She is unable to stand up from the chair without using her arms for support.  She is walking without a cane or walker but is very ataxic.  She is unable to tandem walk.  The Romberg is good.    Reflexes: Deep tendon reflexes are symmetric and normal in arms, cross adductors at the knees.  No clonus.Marland Kitchen        DIAGNOSTIC DATA (LABS, IMAGING, TESTING) - I reviewed patient records, labs, notes, testing and imaging myself where available.  Lab Results  Component Value Date   WBC 7.5 06/04/2022   HGB 13.2 06/04/2022   HCT 39.2 06/04/2022   MCV 91.7 06/04/2022   PLT 228.0 06/04/2022      Component Value Date/Time   NA 143 08/06/2022 0943   K 3.8 08/06/2022 0943   CL 104 08/06/2022 0943   CO2 22 08/06/2022 0943   GLUCOSE 99 08/06/2022 0943   GLUCOSE 105 (H) 06/04/2022 1043   BUN 19 08/06/2022 0943   CREATININE 0.95 08/06/2022 0943   CREATININE 0.87 06/18/2015 1207   CALCIUM 9.4 08/06/2022 0943   PROT 6.4 06/04/2022 1043   ALBUMIN 4.3 06/04/2022 1043   ALBUMIN 4.0 08/10/2019 2356   AST 12 06/04/2022 1043   ALT 10 06/04/2022 1043   ALKPHOS 82 06/04/2022 1043   BILITOT 0.5 06/04/2022 1043   GFRNONAA >60  08/24/2019 0417   GFRAA >60 08/24/2019 0417   Lab Results  Component Value Date   CHOL 79 08/17/2019   HDL 22 (L) 08/17/2019   LDLCALC 29 08/17/2019   TRIG 141 08/17/2019   CHOLHDL 3.6 08/17/2019   No results found for: "HGBA1C" Lab Results  Component Value Date   VITAMINB12 376 08/11/2019   Lab Results  Component Value Date   TSH 3.119 08/11/2019       ASSESSMENT AND PLAN  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HCC) - Plan: CD20 B Cells, IgG, IgA, IgM  Gait disturbance  History of optic neuritis  High risk medication use - Plan: CD20 B Cells, IgG, IgA, IgM    1.   Continue Rituxan for now at 500 mg q6 months.  Check IgG and IgM ad CD20.  If B cell count is not <2% of lymphocytes, we will need to make infusions more frequently  2.   She is neurologically cleared to proceed with shoulder surgery under general anesthetic.   3.   Stay active and exercise as tolerated.   .   4.   Return in 5-6 months or sooner if there are new or worsening neurologic symptoms.     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  Armondo Cech A. Epimenio Foot, MD, [facility] 10/20/2022, 9:07 PM Certified in Neurology, Clinical Neurophysiology, Sleep Medicine and Neuroimaging  [facility] [address], [address] [place], [place] [address] [phone])  [phone]  

## 2022-10-23 ENCOUNTER — Encounter: Payer: Self-pay | Admitting: Family

## 2022-10-23 LAB — CD20 B CELLS
% CD19-B Cells: 0 % — ABNORMAL LOW (ref 4.6–22.1)
% CD20-B Cells: 0 % — ABNORMAL LOW (ref 5.0–22.3)

## 2022-10-23 LAB — IGG, IGA, IGM
IgA/Immunoglobulin A, Serum: 59 mg/dL — ABNORMAL LOW (ref 87–352)
IgG (Immunoglobin G), Serum: 432 mg/dL — ABNORMAL LOW (ref 586–1602)
IgM (Immunoglobulin M), Srm: 13 mg/dL — ABNORMAL LOW (ref 26–217)

## 2022-10-27 ENCOUNTER — Ambulatory Visit (INDEPENDENT_AMBULATORY_CARE_PROVIDER_SITE_OTHER): Payer: Medicare Other | Admitting: Family

## 2022-10-27 ENCOUNTER — Encounter: Payer: Self-pay | Admitting: Family

## 2022-10-27 VITALS — BP 124/68 | HR 64 | Temp 98.6°F | Resp 19 | Ht 65.0 in | Wt 291.0 lb

## 2022-10-27 DIAGNOSIS — G8929 Other chronic pain: Secondary | ICD-10-CM | POA: Diagnosis not present

## 2022-10-27 DIAGNOSIS — M25511 Pain in right shoulder: Secondary | ICD-10-CM | POA: Diagnosis not present

## 2022-10-27 NOTE — Patient Instructions (Signed)
Please call your endocrinologist to get scheduled for your pre-op clearance. I have given you a copy of the form to take to Dr. Katrinka Blazing. I will send your form to Dr. Ranell Patrick.

## 2022-10-27 NOTE — Progress Notes (Signed)
Connie Haynes is a 67 y.o. female with the following history as recorded in EpicCare:  Patient Active Problem List   Diagnosis Date Noted   Long-term current use of immunomodulator 05/18/2022   Acid reflux 10/14/2021   Epigastric pain 10/14/2021   Decreased GFR 09/26/2021   Palpitations - suggestive of PVC's 06/24/2021   Abnormal urinalysis 03/21/2021   Acute cystitis with hematuria 12/20/2020   Age related osteoporosis 12/20/2020   Nocturnal hypoxia 12/20/2020   Post-menopausal 12/20/2020   Supplemental oxygen dependent 12/20/2020   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HCC) 10/30/2020   Encounter for immunotherapy 10/30/2020   History of optic neuritis 10/30/2020   Hypokalemia 09/19/2020   Urinary retention 07/17/2020   Encounter for monitoring immunomodulating therapy 05/02/2020   Vitamin D deficiency 03/07/2020   Numbness 08/29/2019   Gait disturbance 08/29/2019   Abnormal brain MRI 08/29/2019   High risk medication use 08/29/2019   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HCC) 08/11/2019   Adrenal insufficiency (Addison's disease) (HCC) 08/11/2019   Neck pain 02/08/2019   CKD (chronic kidney disease) stage 2, GFR 60-89 ml/min 06/23/2017   Nausea and vomiting 05/05/2017   S/P left TKA 08/25/2016   S/P total knee replacement 08/25/2016   Imbalance 01/16/2016   Lumbar disc herniation with radiculopathy 01/16/2016   Lumbar facet arthropathy 01/16/2016   Low back pain 06/24/2015   Tremor 06/24/2015   Weakness of both lower extremities 06/24/2015   S/P shoulder replacement 06/29/2014   OSA (obstructive sleep apnea) 01/22/2014   Left ureteral stone 12/18/2013   Frequent UTI 12/18/2013   Morbid obesity (HCC) 08/09/2013   S/P right TKA 08/08/2013   Inflammation of eyelid, left 06/01/2013   Dry eye of left side 05/01/2013   Nasolacrimal duct obstruction, acquired 03/25/2013   Epiphora due to insufficient drainage 03/09/2013   Chronic pain syndrome 03/04/2013   Depressive disorder, not elsewhere classified  03/04/2013   Ectropion, senile 02/20/2013   Punctal stenosis 02/20/2013   Trichiasis of eyelid 02/20/2013   Need for prophylactic vaccination and inoculation against influenza 10/04/2012   Erosive osteoarthritis of multiple sites 10/04/2012   Chronic cough 10/04/2012   Swelling of limb 08/21/2012   Essential hypertension, benign 08/21/2012   Preoperative general physical examination 06/12/2012   Restless leg syndrome 06/12/2012   Anxiety 06/12/2012   Headache(784.0) 04/04/2012   Sinusitis, chronic 04/04/2012   Blocked lacrimal duct 09/23/2011   Excessive tearing 09/23/2011   Lung nodule 07/16/2011   Cough 04/21/2011   Wolff-Parkinson-White (WPW) syndrome    Osteoarthritis    Hyperlipidemia    IBS (irritable bowel syndrome)    Nephrolithiasis     Current Outpatient Medications  Medication Sig Dispense Refill   carvedilol (COREG) 25 MG tablet Take 1 tablet (25 mg total) by mouth 2 (two) times daily. 180 tablet 3   citalopram (CELEXA) 40 MG tablet Take 1 tablet (40 mg total) by mouth daily. 90 tablet 3   furosemide (LASIX) 40 MG tablet TAKE 2 TABLETS BY MOUTH EVERY OTHER DAY 90 tablet 3   hydrocortisone (CORTEF) 10 MG tablet Take 5-10 mg by mouth See admin instructions. Take 10mg  in the AM and 5mg  in the afternoon.     LINZESS 72 MCG capsule Take 1 capsule (72 mcg total) by mouth daily. 90 capsule 3   LORazepam (ATIVAN) 0.5 MG tablet One po qd prn 30 tablet 5   methenamine (HIPREX) 1 g tablet Take 1 tablet (1 g total) by mouth 2 (two) times daily with a meal. 60 tablet 3  Multiple Vitamin (MULTIVITAMIN ADULT PO) Take 1 tablet by mouth daily.     ondansetron (ZOFRAN) 4 MG tablet Take 4 mg by mouth every 8 (eight) hours as needed.     potassium chloride (KLOR-CON) 10 MEQ tablet Take 10 mEq by mouth as needed. WITH LASIX     pramipexole (MIRAPEX) 1 MG tablet TAKE 1 PO BID 180 tablet 3   predniSONE (DELTASONE) 20 MG tablet Take 3 pills po the day before infusions. 12 tablet 0    riTUXimab (RITUXAN IV) Inject into the vein.     telmisartan-hydrochlorothiazide (MICARDIS HCT) 80-12.5 MG tablet Take 1 tablet by mouth daily. 90 tablet 3   tiZANidine (ZANAFLEX) 4 MG tablet TAKE 1 TO 1 & 1/2 (ONE & ONE-HALF) TABLETS BY MOUTH THREE TIMES DAILY AS NEEDED 270 tablet 1   traMADol (ULTRAM) 50 MG tablet Take 1 tablet (50 mg total) by mouth every 8 (eight) hours as needed. 90 tablet 2   No current facility-administered medications for this visit.   Facility-Administered Medications Ordered in Other Visits  Medication Dose Route Frequency Provider Last Rate Last Admin   chlorhexidine (HIBICLENS) 4 % liquid 4 application  60 mL Topical Once Dixon, Katharina Caper, PA-C       technetium tetrofosmin (TC-MYOVIEW) injection 31.7 milli Curie  31.7 millicurie Intravenous Once PRN Lars Masson, MD        Allergies: Codeine, Hydrocodone, Lamotrigine, Macrobid [nitrofurantoin], and Robaxin [methocarbamol]  Past Medical History:  Diagnosis Date   Adrenal insufficiency (HCC)    Anemia    denies   Anxiety    Basal cell carcinoma    eye lid   Chronic kidney disease, stage 3 (HCC)    Cough    hx. dry cough chronic   Disorder of tear duct system    frequent tearing of eyes   GERD (gastroesophageal reflux disease)    H/O: whooping cough 2000   History of kidney infection 2015   ? sepsis   History of kidney stones    History of kidney stones    Hyperlipidemia    Hypertension    IBS (irritable bowel syndrome)    controlled with med   Osteoarthritis    arthritis -joints-limited ROM shoulders more on right   PONV (postoperative nausea and vomiting)     per pt hx of low BP following shoulder surgery in 05-2015; was managed in hospital with IV fluids and d/c BP being taken a tthe time; pt was dced and F/U with endcrinoopgist who dx with adrenal insurffiency and started steroid therapy; treatmetnt with positive outome, now managed chronically on hydrocortisone tablets;   Sleep apnea     no longer using cpap   Stroke [facility]    per patient "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 Conni Slipper)   APPENDECTOMY     CARDIAC ELECTROPHYSIOLOGY STUDY AND ABLATION     '93 for WPW syndrome   CARPAL TUNNEL RELEASE Right 12/21/2014   Procedure: RIGHT CARPAL TUNNEL RELEASE ;  Surgeon: Beverely Low, MD;  Location: [facility] OR;  Service: Orthopedics;  Laterality: Right;   CARPAL TUNNEL RELEASE Left    CERVICAL SPINE SURGERY     cervical fusion with hardware retained   COLON SURGERY     "colon detached from abdomen"   COLONOSCOPY WITH PROPOFOL N/A 05/10/2014   Procedure: COLONOSCOPY WITH PROPOFOL;  Surgeon: Charna Elizabeth, MD;  Location: [facility];  Service: Endoscopy;  Laterality: N/A;   CYSTOSCOPY/RETROGRADE/URETEROSCOPY Left 12/18/2013   Procedure: CYSTOSCOPY/RETROGRADE/LEFT STENT;  Surgeon: Anner Crete, MD;  Location: [facility];  Service: Urology;  Laterality: Left;   ESOPHAGOGASTRODUODENOSCOPY (EGD) WITH PROPOFOL N/A 05/10/2014   Procedure: ESOPHAGOGASTRODUODENOSCOPY (EGD) WITH PROPOFOL;  Surgeon: Charna Elizabeth, MD;  [facility];  Service: Endoscopy;  Laterality: N/A;   EYE SURGERY     EYELID CARCINOMA EXCISION     INSERTION OF DIALYSIS CATHETER N/A 08/16/2019   Procedure: INSERTION OF DIALYSIS CATHETER;  Surgeon: Josephine Igo, DO;  [facility]: [facility];  Service: Pulmonary;  Laterality: N/A;   JOINT REPLACEMENT     KNEE ARTHROSCOPY Left 07/16/2016   REVERSE SHOULDER ARTHROPLASTY Right 06/29/2014   Procedure: REVERSE TOTAL SHOULDER ARTHROPLASTY;  Surgeon: Beverely Low,  MD;  Location: [facility] OR;  Service: Orthopedics;  Laterality: Right;   REVISION TOTAL SHOULDER TO REVERSE TOTAL SHOULDER Left 05/15/2015   REVISION TOTAL SHOULDER TO REVERSE TOTAL SHOULDER Left 05/15/2015   Procedure: LEFT SHOULDER REVISION TOTAL SHOULDER ARTHRPLASTY TO REVERSE TOTAL SHOULDER ARTHROPLASTY ;  Surgeon: Beverely Low, MD;  Location: [facility] OR;  Service: Orthopedics;  Laterality: Left;   SPINE SURGERY     tear duct surgery     TOTAL ABDOMINAL HYSTERECTOMY     TOTAL HIP ARTHROPLASTY     x 3(rt x2, lt x1)   TOTAL KNEE ARTHROPLASTY Right 08/08/2013   Procedure: RIGHT TOTAL KNEE ARTHROPLASTY WITH SAPHENOUS NERVE SCAR EXCISION;  Surgeon: Shelda Pal, MD;  Location: [facility];  Service: Orthopedics;  Laterality: Right;   TOTAL KNEE ARTHROPLASTY Left 08/25/2016   Procedure: LEFT TOTAL KNEE ARTHROPLASTY;  Surgeon: Durene Romans, MD;  Location: [facility];  Service: Orthopedics;  Laterality: Left;  70 mins; Adductor block   TOTAL SHOULDER ARTHROPLASTY Left 07/15/2012   Procedure: LEFT TOTAL SHOULDER ARTHROPLASTY;  Surgeon: Verlee Rossetti, MD;  Location[facility];  Service: Orthopedics;  Laterality: Left;   TOTAL SHOULDER REVISION Right 12/21/2014   Procedure: RIGHT SHOULDER POLY EXCHANGE ;  Surgeon: Beverely Low, MD;  Location: [facility] OR;  Service: Orthopedics;  Laterality: Right;   URETEROSCOPY  stent placement    Family History  Problem Relation Age of Onset   Leukemia Brother    Heart disease Mother    Hypertension Mother    Hyperlipidemia Mother    Heart attack Mother    Heart failure Mother    Heart disease Maternal Grandfather    Heart attack Maternal Grandfather    Cancer Paternal Grandfather    Lung cancer Paternal Grandfather    Cancer Maternal Uncle        bone    Social History   Tobacco Use   Smoking status: Never   Smokeless tobacco: Never  Substance Use Topics   Alcohol use: No    Alcohol/week: 0.0 standard drinks of alcohol    Subjective:   Will be having shoulder  surgery- required to have medical clearance as well as neurology, endocrinology and cardiology clearance; labs do not have to be done here; is scheduled to see urology next week as well;   Objective:  Vitals:   10/27/22  0820  BP: 124/68  Pulse: 64  Resp: 19  Temp: 98.6 F (37 C)  TempSrc: Oral  SpO2: 97%  Weight: 291 lb (132 kg)  Height: 5\' 5"  (1.651 m)    General: Well developed, well nourished, in no acute distress  Skin : Warm and dry.  Head: Normocephalic and atraumatic  Lungs: Respirations unlabored; clear to auscultation bilaterally without wheeze, rales, rhonchi  CVS exam: normal rate and regular rhythm.  Neurologic: Alert and oriented; speech intact; face symmetrical; moves all extremities well; CNII-XII intact without focal deficit   Assessment:  1. Chronic right shoulder pain     Plan:  Surgical clearance provided- medical clearance only; she has already gotten neurologic clearance and will need to get cardiology and endocrinology clearance;  Follow up as needed otherwise;   No follow-ups on file.  No orders of the defined types were placed in this encounter.   Requested Prescriptions    No prescriptions requested or ordered in this encounter

## 2022-10-28 ENCOUNTER — Telehealth: Payer: Self-pay | Admitting: *Deleted

## 2022-10-28 NOTE — Telephone Encounter (Signed)
CD20 count is 0.  No change in rituxin infusion per Dr. Epimenio Foot.

## 2022-10-28 NOTE — Progress Notes (Unsigned)
Virtual Visit via Telephone Note   _____________   Date:  10/29/2022   Patient ID:  Amiela, Renner Jan 07, 1956, MRN [id number] Patient Location:  Home Provider location:   Office  Primary Care Provider:  Olive Bass, FNP Primary Cardiologist:  Sherryl Manges, MD  Patient is having episodes of heart rate jumping to 170-180s for 30 seconds to 1 minute. These episodes have been occurring since July. She wears an Apple watch and has [facility] but has not been able to catch it. Will have patient contacted to be scheduled with Dr. Graciela Husbands or EP APP for further evaluation. Will hold off on preoperative risk assessment until that time. Patient is in agreement with plan.     Carlos Levering, NP  10/29/2022, 9:29 AM

## 2022-10-29 ENCOUNTER — Ambulatory Visit (INDEPENDENT_AMBULATORY_CARE_PROVIDER_SITE_OTHER): Payer: Medicare Other

## 2022-10-29 DIAGNOSIS — Z01810 Encounter for preprocedural cardiovascular examination: Secondary | ICD-10-CM

## 2022-11-04 ENCOUNTER — Ambulatory Visit
Admission: RE | Admit: 2022-11-04 | Discharge: 2022-11-04 | Disposition: A | Discharge status: Home / Self Care | Payer: Medicare Other | Source: Ambulatory Visit | Attending: Orthopedic Surgery | Admitting: Orthopedic Surgery

## 2022-11-04 ENCOUNTER — Ambulatory Visit (INDEPENDENT_AMBULATORY_CARE_PROVIDER_SITE_OTHER): Payer: Medicare Other | Admitting: Urology

## 2022-11-04 ENCOUNTER — Encounter: Payer: Self-pay | Admitting: Urology

## 2022-11-04 VITALS — BP 115/71 | HR 57 | Ht 65.0 in | Wt 291.0 lb

## 2022-11-04 DIAGNOSIS — Z87440 Personal history of urinary (tract) infections: Secondary | ICD-10-CM | POA: Diagnosis not present

## 2022-11-04 DIAGNOSIS — N390 Urinary tract infection, site not specified: Secondary | ICD-10-CM

## 2022-11-04 DIAGNOSIS — Z96611 Presence of right artificial shoulder joint: Secondary | ICD-10-CM

## 2022-11-04 DIAGNOSIS — Z09 Encounter for follow-up examination after completed treatment for conditions other than malignant neoplasm: Secondary | ICD-10-CM | POA: Diagnosis not present

## 2022-11-04 NOTE — Progress Notes (Signed)
Assessment: 1. Frequent UTI     Plan: Continue methods to reduce the risk of UTIs including increased fluid intake, timed and double voiding, daily cranberry supplement, daily probiotics, use of vaginal hormone cream. Continue vaginal hormone cream 2-3 times/week.  Continue methenamine 1 g BID for UTI prevention.  Return to office in 6-8 weeks  Chief Complaint:  Chief Complaint  Patient presents with   Frequent UTI    History of Present Illness:  Connie Haynes is a 66 y.o. female who is seen for further evaluation of frequent UTI's and history of nephrolithiasis. She has been treated for frequent UTIs.  No typical UTI symptoms.  No dysuria or gross hematuria.  No fevers, chills, or flank pain.  She does occasionally notice some cloudy urine with a strong odor.  She was last treated for a UTI in May 2024.  Urine culture results: 6/21 50-100K E. Coli 6/22 <10K mixed flora 11/22 >100K Klebsiella 1/24 50-100K Enterobacter 3/24 <10K mixed flora 4/24 Mixed flora 5/24 10-49K Enterococcus 6/24 Mixed flora  She is unaware of the need to void.  She does not have a sensation of bladder fullness.  She voids on a schedule both daytime and nighttime.  She is not sure if she empties her bladder completely.  She has occasional episodes of incontinence without awareness.  These symptoms have been present since her diagnosis of transverse myelitis in 2021.  She has a history of kidney stones.  She required stent placement for a infected stone in 2015.  She reports passing a stone in June 2024.  PVR = 21 ml. Resolve MDX urine culture from 08/04/2022 grew Enterococcus and Klebsiella.  She was treated with Augmentin x 7 days. CT abdomen and pelvis without contrast from 08/21/2022 showed no renal or ureteral calculi, no evidence of obstruction or other urologic abnormalities.  She noted some improvement in her voiding symptoms after completing the antibiotics.  She resumed her regular voiding  symptoms with hesitancy and sensation of incomplete emptying.  She also noted a decreased stream.  She does not have a awareness of her need to void so she continues to void on a schedule.  No dysuria or gross hematuria.  No flank pain.  No fevers or chills. Urine culture from 09/01/2022 grew 50-100 K Enterobacter.  She was treated with Bactrim and started on daily Macrobid for UTI prevention. She discontinued the Macrobid due to nausea and vomiting. At her visit in August 2024, she was doing well without any new urinary symptoms.  No UTI symptoms. She continued on Premarin vaginal cream and was started on methenamine twice daily.  She returns today for follow-up.  She is doing well on the methenamine.  No recent UTI symptoms.  Her bladder symptoms remain stable.  Portions of the above documentation were copied from a prior visit for review purposes only.   Past Medical History:  Past Medical History:  Diagnosis Date   Adrenal insufficiency (HCC)    Anemia    denies   Anxiety    Basal cell carcinoma    eye lid   Chronic kidney disease, stage 3 (HCC)    Cough    hx. dry cough chronic   Disorder of tear duct system    frequent tearing of eyes   GERD (gastroesophageal reflux disease)    H/O: whooping cough 2000   History of kidney infection 2015   ? sepsis   History of kidney stones    History of kidney stones  Hyperlipidemia    Hypertension    IBS (irritable bowel syndrome)    controlled with med   Osteoarthritis    arthritis -joints-limited ROM shoulders more on right   PONV (postoperative nausea and vomiting)     per pt hx of low BP following shoulder surgery in 05-2015; was managed in hospital with IV fluids and d/c BP being taken a tthe time; pt was dced and F/U with endcrinoopgist who dx with adrenal insurffiency and started steroid therapy; treatmetnt with positive outome, now managed chronically on hydrocortisone tablets;   Sleep apnea    no longer using cpap   Stroke  [facility])    per patient "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 Conni Slipper)   APPENDECTOMY     CARDIAC ELECTROPHYSIOLOGY STUDY AND ABLATION     '93 for WPW syndrome   CARPAL TUNNEL RELEASE Right 12/21/2014   Procedure: RIGHT CARPAL TUNNEL RELEASE ;  Surgeon: Beverely Low, MD;  Location: [facility] OR;  Service: Orthopedics;  Laterality: Right;   CARPAL TUNNEL RELEASE Left    CERVICAL SPINE SURGERY     cervical fusion with hardware retained   COLON SURGERY     "colon detached from abdomen"   COLONOSCOPY WITH PROPOFOL N/A 05/10/2014   Procedure: COLONOSCOPY WITH PROPOFOL;  Surgeon: Charna Elizabeth, MD;  [facility];  Service: Endoscopy;  Laterality: N/A;   CYSTOSCOPY/RETROGRADE/URETEROSCOPY Left 12/18/2013   Procedure: CYSTOSCOPY/RETROGRADE/LEFT STENT;  Surgeon: Anner Crete, MD;  Location: [facility];  Service: Urology;  Laterality: Left;   ESOPHAGOGASTRODUODENOSCOPY (EGD) WITH PROPOFOL N/A 05/10/2014   Procedure: ESOPHAGOGASTRODUODENOSCOPY (EGD) WITH PROPOFOL;  Surgeon: Charna Elizabeth, MD;  [facility];  Service: Endoscopy;  Laterality: N/A;   EYE SURGERY     EYELID CARCINOMA EXCISION     INSERTION OF DIALYSIS CATHETER N/A 08/16/2019   Procedure: INSERTION OF DIALYSIS CATHETER;  Surgeon: Josephine Igo, DO;  [facility]: [facility];  Service: Pulmonary;  Laterality: N/A;   JOINT REPLACEMENT     KNEE ARTHROSCOPY Left 07/16/2016   REVERSE SHOULDER ARTHROPLASTY Right 06/29/2014   Procedure: REVERSE TOTAL SHOULDER ARTHROPLASTY;  Surgeon: Beverely Low, MD;   Location: [facility] OR;  Service: Orthopedics;  Laterality: Right;   REVISION TOTAL SHOULDER TO REVERSE TOTAL SHOULDER Left 05/15/2015   REVISION TOTAL SHOULDER TO REVERSE TOTAL SHOULDER Left 05/15/2015   Procedure: LEFT SHOULDER REVISION TOTAL SHOULDER ARTHRPLASTY TO REVERSE TOTAL SHOULDER ARTHROPLASTY ;  Surgeon: Beverely Low, MD;  Location: [facility] OR;  Service: Orthopedics;  Laterality: Left;   SPINE SURGERY     tear duct surgery     TOTAL ABDOMINAL HYSTERECTOMY     TOTAL HIP ARTHROPLASTY     x 3(rt x2, lt x1)   TOTAL KNEE ARTHROPLASTY Right 08/08/2013   Procedure: RIGHT TOTAL KNEE ARTHROPLASTY WITH SAPHENOUS NERVE SCAR EXCISION;  Surgeon: Shelda Pal, MD;  Location: [facility];  Service: Orthopedics;  Laterality: Right;   TOTAL KNEE ARTHROPLASTY Left 08/25/2016   Procedure: LEFT TOTAL KNEE ARTHROPLASTY;  Surgeon: Durene Romans, MD;  Location: [facility];  Service: Orthopedics;  Laterality: Left;  70 mins; Adductor block   TOTAL SHOULDER ARTHROPLASTY Left 07/15/2012   Procedure: LEFT TOTAL SHOULDER ARTHROPLASTY;  Surgeon: Verlee Rossetti, MD;  Location: [facility] OR;  Service: Orthopedics;  Laterality: Left;   TOTAL SHOULDER REVISION Right 12/21/2014   Procedure: RIGHT SHOULDER POLY EXCHANGE ;  Surgeon: Beverely Low, MD;  Location: [facility] OR;  Service: Orthopedics;  Laterality: Right;   URETEROSCOPY  stent placement    Allergies:  Allergies  Allergen Reactions   Codeine Nausea And Vomiting    Can take if takes phernergan   Hydrocodone Nausea Only   Lamotrigine     "dizziness, shakiness and just feeling not right."   Macrobid [Nitrofurantoin] Diarrhea and Nausea And Vomiting   Robaxin [Methocarbamol] Rash    Family History:  Family History  Problem Relation Age of Onset   Leukemia Brother    Heart disease Mother    Hypertension Mother    Hyperlipidemia Mother    Heart attack Mother    Heart failure Mother    Heart disease Maternal Grandfather    Heart attack Maternal Grandfather    Cancer Paternal  Grandfather    Lung cancer Paternal Grandfather    Cancer Maternal Uncle        bone    Social History:  Social History   Tobacco Use   Smoking status: Never   Smokeless tobacco: Never  Vaping Use   Vaping status: Never Used  Substance Use Topics   Alcohol use: No    Alcohol/week: 0.0 standard drinks of alcohol   Drug use: No    ROS: Constitutional:  Negative for fever, chills, weight loss CV: Negative for chest pain, previous MI, hypertension Respiratory:  Negative for shortness of breath, wheezing, sleep apnea, frequent cough GI:  Negative for nausea, vomiting, bloody stool, GERD  Physical exam: BP 115/71   Pulse (!) 57   Ht 5\' 5"  (1.651 m)   Wt 291 lb (132 kg)   BMI 48.42 kg/m  GENERAL APPEARANCE:  Well appearing, well developed, well nourished, NAD HEENT:  Atraumatic, normocephalic, oropharynx clear NECK:  Supple without lymphadenopathy or thyromegaly ABDOMEN:  Soft, non-tender, no masses EXTREMITIES:  Moves all extremities well, without clubbing, cyanosis, or edema NEUROLOGIC:  Alert and oriented x 3, normal gait, CN II-XII grossly intact MENTAL STATUS:  appropriate BACK:  Non-tender to palpation, No CVAT SKIN:  Warm, dry, and intact   Results: U/A: negative

## 2022-11-06 ENCOUNTER — Telehealth: Payer: Self-pay | Admitting: Pharmacy Technician

## 2022-11-06 ENCOUNTER — Other Ambulatory Visit (HOSPITAL_COMMUNITY): Payer: Self-pay

## 2022-11-06 NOTE — Telephone Encounter (Signed)
Pharmacy Patient Advocate Encounter   Received notification from CoverMyMeds that prior authorization for traMADol HCl 50MG  tablets is required/requested.   Insurance verification completed.   The patient is insured through [employer] [facility] .   Per test claim: PA required; PA submitted to [employer] [facility] via CoverMyMeds Key/confirmation #/EOC [id number] Status is pending

## 2022-11-09 LAB — URINALYSIS, ROUTINE W REFLEX MICROSCOPIC
Bilirubin, UA: NEGATIVE
Glucose, UA: NEGATIVE
Ketones, UA: NEGATIVE
Leukocytes,UA: NEGATIVE
Nitrite, UA: NEGATIVE
Protein,UA: NEGATIVE
RBC, UA: NEGATIVE
Specific Gravity, UA: >1.03 — ABNORMAL HIGH (ref 1.005–1.030)
Urobilinogen, Ur: 0.2 mg/dL (ref 0.2–1.0)
pH, UA: 5.5 (ref 5.0–7.5)

## 2022-11-11 NOTE — Telephone Encounter (Signed)
Pharmacy Patient Advocate Encounter  Received notification from [employer] [facility] that Prior Authorization for traMADol HCl 50MG  tablets has been APPROVED from 11/06/2022 to 05/05/2023.   PA #/Case ID/Reference #: PA Case ID #: [id number]

## 2022-11-14 ENCOUNTER — Encounter: Payer: Self-pay | Admitting: Urology

## 2022-12-01 NOTE — Progress Notes (Signed)
Cardiology Office Note Date:  12/01/2022  Patient ID:  Connie Haynes, Connie Haynes [birth year], MRN [id number] PCP:  Olive Bass, FNP  Cardiologist:  Dr. Graciela Husbands    Chief Complaint:  surgical clearance, palpitations   History of Present Illness: Connie Haynes is a 67 y.o. female with history of WPW (s/p ablation 1993), HTN, HLD, IBS, OA, Addison's  disease/adrenal insufficiency follows with Dr. Katrinka Blazing, transverse myelitis, neuromyelitis  (endocrinologist at [facility], also managing steroids peri-op).     She saw Dr. Graciela Husbands 07/03/21, c/o CP, discussed palpitations historically to be PACs Monitor noted brief AFib less then a minuite, planned for monitoring via her wearable tech, to notify if elevated HRs of hours in duration. Edema treated with lasix  She saw Mardelle Matte 08/06/22, palpitations were improved as was her edema. Reported symptoms with low BPs at home.  Discussed medication adjustments, though she was hesitant to make any and planned to f/u with her PMD.  Pending R shoulder surgery, not yet scheduled Telephone visit with pre-op APP she reportesd palpitations/rapid HRs perhaps a minute in duration, planned for EP follow up and comment on her pre-op evaluation as well.  RCRI is zero (0.4%)  TODAY She reports hx of several b/l shoulder surgeries/replacements, her R shoulder now having spontaneous dislocations and they are discussing another surgery for this.  Though her orthopedic team not as enthusiastic about doing surgery with her comorbid conditions  She reports a few months of more then usual DOE She walks as often and as regularly as she can and in the last few months notes more winded, more difficult to complete/go as far.  New or different palpitations Different the the PVCs, more reminiscent of her WPW tachycardia though there are very brief, lasting only seconds where her WPW tachycardia lasted a really long time. These started up a couple months ago as well Notes her HRs via  watch anywhere from 40-194 She is very aware of the rapid palpitations, they hit her suddenly, feel pressure in her chest/head with them and reflexively causes her to gasp/take in a deep breath They leave feeling tired Not associated with dizziness/near syncope  Low BPs' these do seem associate with when she has a UTI, SBPs in the 80's and she does feel very weak/lightheaded with them This has been discussed before and has been advised to hold her coreg days her BP is low. She regularly gets her PM coreg in, morning does is on/off depending on where her BP starts Her palpitations are not associated with skipped coreg days   She has noted pattern to this, can go days maybe weeks without then it happens, not positional, exertional,   She gets her infusion tx every 77mo, she can always tell when she is getting close, her neuropathy with start up, legs will get numb and very weak >> following he infusion for a couple weeks feels awful from the medication but then has a few months of "her normal, more functional" DOE and palps do not seem connected to infusion timing    Past Medical History:  Diagnosis Date   Adrenal insufficiency (HCC)    Anemia    denies   Anxiety    Basal cell carcinoma    eye lid   Chronic kidney disease, stage 3 (HCC)    Cough    hx. dry cough chronic   Disorder of tear duct system    frequent tearing of eyes   GERD (gastroesophageal reflux disease)  H/O: whooping cough 2000   History of kidney infection 2015   ? sepsis   History of kidney stones    History of kidney stones    Hyperlipidemia    Hypertension    IBS (irritable bowel syndrome)    controlled with med   Osteoarthritis    arthritis -joints-limited ROM shoulders more on right   PONV (postoperative nausea and vomiting)     per pt hx of low BP following shoulder surgery in 05-2015; was managed in hospital with IV fluids and d/c BP being taken a tthe time; pt was dced and F/U with endcrinoopgist who  dx with adrenal insurffiency and started steroid therapy; treatmetnt with positive outome, now managed chronically on hydrocortisone tablets;   Sleep apnea    no longer using cpap   Stroke [facility])    per patient "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 Conni Slipper)   APPENDECTOMY     CARDIAC ELECTROPHYSIOLOGY STUDY AND ABLATION     '93 for WPW syndrome   CARPAL TUNNEL RELEASE Right 12/21/2014   Procedure: RIGHT CARPAL TUNNEL RELEASE ;  Surgeon: Beverely Low, MD;  Location: [facility] OR;  Service: Orthopedics;  Laterality: Right;   CARPAL TUNNEL RELEASE Left    CERVICAL SPINE SURGERY     cervical fusion with hardware retained   COLON SURGERY     "colon detached from abdomen"   COLONOSCOPY WITH PROPOFOL N/A 05/10/2014   Procedure: COLONOSCOPY WITH PROPOFOL;  Surgeon: Charna Elizabeth, MD;  Location: [facility];  Service: Endoscopy;  Laterality: N/A;   CYSTOSCOPY/RETROGRADE/URETEROSCOPY Left 12/18/2013   Procedure: CYSTOSCOPY/RETROGRADE/LEFT STENT;  Surgeon: Anner Crete, MD;  Location: [facility];  Service: Urology;  Laterality: Left;   ESOPHAGOGASTRODUODENOSCOPY (EGD) WITH PROPOFOL N/A 05/10/2014   Procedure: ESOPHAGOGASTRODUODENOSCOPY (EGD) WITH PROPOFOL;  Surgeon: Charna Elizabeth, MD;  Location: [facility];  Service: Endoscopy;  Laterality: N/A;   EYE SURGERY     EYELID CARCINOMA EXCISION     INSERTION OF DIALYSIS CATHETER N/A 08/16/2019   Procedure: INSERTION OF DIALYSIS CATHETER;  Surgeon: Josephine Igo, DO;  Location: [facility];  Service: Pulmonary;  Laterality: N/A;   JOINT REPLACEMENT      KNEE ARTHROSCOPY Left 07/16/2016   REVERSE SHOULDER ARTHROPLASTY Right 06/29/2014   Procedure: REVERSE TOTAL SHOULDER ARTHROPLASTY;  Surgeon: Beverely Low, MD;  Location: [facility] OR;  Service: Orthopedics;  Laterality: Right;   REVISION TOTAL SHOULDER TO REVERSE TOTAL SHOULDER Left 05/15/2015   REVISION TOTAL SHOULDER TO REVERSE TOTAL SHOULDER Left 05/15/2015   Procedure: LEFT SHOULDER REVISION TOTAL SHOULDER ARTHRPLASTY TO REVERSE TOTAL SHOULDER ARTHROPLASTY ;  Surgeon: Beverely Low, MD;  Location: [facility] OR;  Service: Orthopedics;  Laterality: Left;   SPINE SURGERY     tear duct surgery     TOTAL ABDOMINAL HYSTERECTOMY     TOTAL HIP ARTHROPLASTY     x 3(rt x2, lt x1)   TOTAL KNEE ARTHROPLASTY Right 08/08/2013   Procedure: RIGHT TOTAL KNEE ARTHROPLASTY WITH SAPHENOUS NERVE SCAR EXCISION;  Surgeon: Shelda Pal, MD;  Location: [facility];  Service: Orthopedics;  Laterality: Right;   TOTAL KNEE ARTHROPLASTY Left 08/25/2016   Procedure: LEFT TOTAL KNEE ARTHROPLASTY;  Surgeon: Durene Romans, MD;  Location: [facility];  Service: Orthopedics;  Laterality: Left;  70 mins; Adductor block   TOTAL SHOULDER ARTHROPLASTY Left 07/15/2012   Procedure: LEFT TOTAL SHOULDER ARTHROPLASTY;  Surgeon: Verlee Rossetti, MD;  Location: [facility] OR;  Service: Orthopedics;  Laterality: Left;   TOTAL SHOULDER REVISION Right 12/21/2014   Procedure: RIGHT SHOULDER POLY EXCHANGE ;  Surgeon: Beverely Low, MD;  Location: [facility] OR;  Service: Orthopedics;  Laterality: Right;   URETEROSCOPY  stent placement    Current Outpatient Medications  Medication Sig Dispense Refill   carvedilol (COREG) 25 MG tablet Take 1 tablet (25 mg total) by mouth 2 (two) times daily. 180 tablet 3   citalopram (CELEXA) 40 MG tablet Take 1 tablet (40 mg total) by mouth daily. 90 tablet 3   furosemide (LASIX) 40 MG tablet TAKE 2 TABLETS BY MOUTH EVERY OTHER DAY 90 tablet 3   hydrocortisone (CORTEF) 10 MG tablet Take 5-10 mg by mouth See admin instructions. Take 10mg  in  the AM and 5mg  in the afternoon.     LINZESS 72 MCG capsule Take 1 capsule (72 mcg total) by mouth daily. 90 capsule 3   LORazepam (ATIVAN) 0.5 MG tablet One po qd prn 30 tablet 5   methenamine (HIPREX) 1 g tablet Take 1 tablet (1 g total) by mouth 2 (two) times daily with a meal. 60 tablet 3   Multiple Vitamin (MULTIVITAMIN ADULT PO) Take 1 tablet by mouth daily.     ondansetron (ZOFRAN) 4 MG tablet Take 4 mg by mouth every 8 (eight) hours as needed.     potassium chloride (KLOR-CON) 10 MEQ tablet Take 10 mEq by mouth as needed. WITH LASIX     pramipexole (MIRAPEX) 1 MG tablet TAKE 1 PO BID 180 tablet 3   predniSONE (DELTASONE) 20 MG tablet Take 3 pills po the day before infusions. 12 tablet 0   riTUXimab (RITUXAN IV) Inject into the vein.     telmisartan-hydrochlorothiazide (MICARDIS HCT) 80-12.5 MG tablet Take 1 tablet by mouth daily. 90 tablet 3   tiZANidine (ZANAFLEX) 4 MG tablet TAKE 1 TO 1 & 1/2 (ONE & ONE-HALF) TABLETS BY MOUTH THREE TIMES DAILY AS NEEDED 270 tablet 1   traMADol (ULTRAM) 50 MG tablet Take 1 tablet (50 mg total) by mouth every 8 (eight) hours as needed. 90 tablet 2   No current facility-administered medications for this visit.   Facility-Administered Medications Ordered in Other Visits  Medication Dose Route Frequency Provider Last Rate Last Admin   chlorhexidine (HIBICLENS) 4 % liquid 4 application  60 mL Topical Once Dixon, Katharina Caper, PA-C       technetium tetrofosmin (TC-MYOVIEW) injection 31.7 milli Curie  31.7 millicurie Intravenous Once PRN Lars Masson, MD        Allergies:   Codeine, Hydrocodone, Lamotrigine, Macrobid [nitrofurantoin], and Robaxin [methocarbamol]   Social History:  The patient  reports that she has never smoked. She has never used smokeless tobacco. She reports that she does not drink alcohol and does not use drugs.   Family History:  The patient's family history includes Cancer in her maternal uncle and paternal grandfather; Heart  attack in her maternal grandfather and mother; Heart disease in her maternal grandfather and mother; Heart failure in her mother; Hyperlipidemia in her mother; Hypertension in her mother; Leukemia in her brother; Lung cancer in her  paternal grandfather.  ROS:  Please see the history of present illness.    All other systems are reviewed and otherwise negative.   PHYSICAL EXAM:  VS:  There were no vitals taken for this visit. BMI: There is no height or weight on file to calculate BMI. Well nourished, well developed, in no acute distress  HEENT: normocephalic, atraumatic  Neck: no JVD, carotid bruits or masses Cardiac:   RRR; no significant murmurs, no rubs, or gallops Lungs: CTA b/l, no wheezing, rhonchi or rales  Abd: soft, nontender MS: no deformity or atrophy Ext: no pitting edema  Skin: warm and dry, no rash Neuro:  No gross deficits appreciated Psych: euthymic mood, full affect   EKG:  Done today and reviewed by myself  SR 63bpm, not pre-excited, unchanged from priors  07/24/21: stress myoview  The study is normal. The study is low risk.   No ST deviation was noted.   LV perfusion is normal. There is no evidence of ischemia. There is no evidence of infarction.   Left ventricular function is normal. Nuclear stress EF: 69 %. The left ventricular ejection fraction is hyperdynamic (>65%). End diastolic cavity size is normal.   Prior study available for comparison from 08/22/2015.   No significant changes from previous study from August 22, 2015   11/22/20: TTE  1. Left ventricular ejection fraction, by estimation, is 60 to 65%. Left  ventricular ejection fraction by 3D volume is 65 %. The left ventricle has  normal function. The left ventricle has no regional wall motion  abnormalities. Left ventricular diastolic   parameters were normal. The average left ventricular global longitudinal  strain is -25.6 %. The global longitudinal strain is normal.   2. Right ventricular systolic  function is normal. The right ventricular  size is normal. There is mildly elevated pulmonary artery systolic  pressure. The estimated right ventricular systolic pressure is 40.7 mmHg.   3. The mitral valve is normal in structure. No evidence of mitral valve  regurgitation.   4. The aortic valve is tricuspid. Aortic valve regurgitation is mild. No  aortic stenosis is present.   5. The inferior vena cava is dilated in size with >50% respiratory  variability, suggesting right atrial pressure of 8 mmHg.   Comparison(s): No prior Echocardiogram.    Sept 2022: monitor Patient had a min HR of 49 bpm, max HR of 184 bpm, and avg HR of 68 bpm. Predominant underlying rhythm was Sinus Rhythm. 2 Ventricular Tachycardia runs occurred, the run with the fastest interval lasting 5 beats with a max rate of 174 bpm, the longest  lasting 5 beats with an avg rate of 163 bpm. 46 Supraventricular Tachycardia runs occurred, the run with the fastest interval lasting 5 beats with a max rate of 184 bpm, the longest lasting 27.7 secs with an avg rate of 106 bpm. Isolated SVEs were rare  (<1.0%), SVE Couplets were rare (<1.0%), and SVE Triplets were rare (<1.0%). Isolated VEs were rare (<1.0%), and no VE Couplets or VE Triplets were present.   08/22/15: Lexiscan stress test Nuclear stress EF: 76%. There was no ST segment deviation noted during stress. This is a low risk study. Basal septal wall decreased radiotracer uptake seen at both rest and stress, no ischemia identified.  Recent Labs: 06/04/2022: ALT 10; Hemoglobin 13.2; Platelets 228.0 08/06/2022: BUN 19; Creatinine, Ser 0.95; Potassium 3.8; Sodium 143  No results found for requested labs within last 365 days.   CrCl cannot be calculated (Patient's  most recent lab result is older than the maximum 21 days allowed.).   Wt Readings from Last 3 Encounters:  11/04/22 291 lb (132 kg)  10/27/22 291 lb (132 kg)  10/20/22 291 lb 8 oz (132.2 kg)     Other studies  reviewed: Additional studies/records reviewed today include: summarized above  ASSESSMENT AND PLAN:  1. Pre-op      She is having some new symptoms, finsl decision from her ortho on proceeding not yet made wither     With some new symptoms will hold off on cardiac surgical risk until further evaluation completed      2. HTN     Looks good today, no changes     low numbers intermittently at home (usually associated with infection/UTIs), may consider toprol though await monitor/echo  3. WPW, ablated     no obvious pre-excitation is appreciated on EKG     New rapid palpitations     2 week XIO AT ? WPW, hx of PVCs, ?NSVTs?  4. Adrenal insufficiency     Follows with endocriology     Chronic steroids  5. DOE     Perhaps multifactorial     No CP, neg stress myoview last yer     Plan echo    Disposition: 2 mo, sooner if needed, pre-op cardiac risk assessment pending above   Current medicines are reviewed at length with the patient today.  The patient did not have any concerns regarding medicines.  Judith Blonder, PA-C 12/01/2022 3:47 PM     [facility] [address] [place] [address] [phone] (office)  [phone] (fax)

## 2022-12-04 ENCOUNTER — Ambulatory Visit: Payer: Medicare Other | Attending: Cardiovascular Disease | Admitting: Physician Assistant

## 2022-12-04 ENCOUNTER — Encounter: Payer: Self-pay | Admitting: Physician Assistant

## 2022-12-04 ENCOUNTER — Ambulatory Visit: Payer: Medicare Other | Attending: Physician Assistant

## 2022-12-04 VITALS — BP 110/60 | HR 63 | Ht 65.0 in | Wt 295.6 lb

## 2022-12-04 DIAGNOSIS — R002 Palpitations: Secondary | ICD-10-CM | POA: Diagnosis not present

## 2022-12-04 DIAGNOSIS — I456 Pre-excitation syndrome: Secondary | ICD-10-CM | POA: Diagnosis not present

## 2022-12-04 DIAGNOSIS — R42 Dizziness and giddiness: Secondary | ICD-10-CM

## 2022-12-04 DIAGNOSIS — I10 Essential (primary) hypertension: Secondary | ICD-10-CM

## 2022-12-04 DIAGNOSIS — Z01810 Encounter for preprocedural cardiovascular examination: Secondary | ICD-10-CM | POA: Diagnosis not present

## 2022-12-04 DIAGNOSIS — R0609 Other forms of dyspnea: Secondary | ICD-10-CM

## 2022-12-04 LAB — BASIC METABOLIC PANEL
BUN/Creatinine Ratio: 17 (ref 12–28)
BUN: 16 mg/dL (ref 8–27)
CO2: 23 mmol/L (ref 20–29)
Calcium: 9.4 mg/dL (ref 8.7–10.3)
Chloride: 101 mmol/L (ref 96–106)
Creatinine, Ser: 0.92 mg/dL (ref 0.57–1.00)
Glucose: 98 mg/dL (ref 70–99)
Potassium: 3.9 mmol/L (ref 3.5–5.2)
Sodium: 139 mmol/L (ref 134–144)
eGFR: 67 mL/min/{1.73_m2} (ref >59)

## 2022-12-04 LAB — MAGNESIUM: Magnesium: 2.1 mg/dL (ref 1.6–2.3)

## 2022-12-04 NOTE — Progress Notes (Unsigned)
Enrolled patient for a 14 day Zio AT monitor to be mailed to patients home   Connie Haynes to read

## 2022-12-04 NOTE — Patient Instructions (Signed)
Medication Instructions:  Your physician recommends that you continue on your current medications as directed. Please refer to the Current Medication list given to you today.  *If you need a refill on your cardiac medications before your next appointment, please call your pharmacy*  Lab Work: BMET, MAG-TODAY If you have labs (blood work) drawn today and your tests are completely normal, you will receive your results only by: MyChart Message (if you have MyChart) OR A paper copy in the mail If you have any lab test that is abnormal or we need to change your treatment, we will call you to review the results.  Testing/Procedures: Your physician has requested that you have an echocardiogram. Echocardiography is a painless test that uses sound waves to create images of your heart. It provides your doctor with information about the size and shape of your heart and how well your heart's chambers and valves are working. This procedure takes approximately one hour. There are no restrictions for this procedure. Please do NOT wear cologne, perfume, aftershave, or lotions (deodorant is allowed). Please arrive 15 minutes prior to your appointment time.   ZIO AT Long term monitor-Live Telemetry  Your physician has requested you wear a ZIO patch monitor for 14 days.  This is a single patch monitor. [employer] supplies one patch monitor per enrollment. Additional  stickers are not available.  Please do not apply patch if you will be having a Nuclear Stress Test, Echocardiogram, Cardiac CT, MRI,  or Chest Xray during the period you would be wearing the monitor. The patch cannot be worn during  these tests. You cannot remove and re-apply the ZIO AT patch monitor.  Your ZIO patch monitor will be mailed 3 day USPS to your address on file. It may take 3-5 days to  receive your monitor after you have been enrolled.  Once you have received your monitor, please review the enclosed instructions. Your monitor has   already been registered assigning a specific monitor serial # to you.   Billing and Patient Assistance Program information  Meredeth Ide has been supplied with any insurance information on record for billing. [employer] offers a sliding scale Patient Assistance Program for patients without insurance, or whose  insurance does not completely cover the cost of the ZIO patch monitor. You must apply for the  Patient Assistance Program to qualify for the discounted rate. To apply, call [employer] at [phone],  select option 4, select option 2 , ask to apply for the Patient Assistance Program, (you can request an  interpreter if needed). [employer] will ask your household income and how many people are in your  household. [employer] will quote your out-of-pocket cost based on this information. They will also be able  to set up a 12 month interest free payment plan if needed.  Applying the monitor   Shave hair from upper left chest.  Hold the abrader disc by orange tab. Rub the abrader in 40 strokes over left upper chest as indicated in  your monitor instructions.  Clean area with 4 enclosed alcohol pads. Use all pads to ensure the area is cleaned thoroughly. Let  dry.  Apply patch as indicated in monitor instructions. Patch will be placed under collarbone on left side of  chest with arrow pointing upward.  Rub patch adhesive wings for 2 minutes. Remove the white label marked "1". Remove the white label  marked "2". Rub patch adhesive wings for 2 additional minutes.  While looking in a mirror, press and release button  in center of patch. A small green light will flash 3-4  times. This will be your only indicator that the monitor has been turned on.  Do not shower for the first 24 hours. You may shower after the first 24 hours.  Press the button if you feel a symptom. You will hear a small click. Record Date, Time and Symptom in  the Patient Log.   Starting the Gateway  In your kit there is a Orthoptist box the size of a cellphone. This is Buyer, retail. It transmits all your  recorded data to [facility]. This box must always stay within 10 feet of you. Open the box and push the *  button. There will be a light that blinks orange and then green a few times. When the light stops  blinking, the Gateway is connected to the ZIO patch. Call [employer] at [phone] to confirm your monitor is transmitting.  Returning your monitor  Remove your patch and place it inside the Gateway. In the lower half of the Gateway there is a white  bag with prepaid postage on it. Place Gateway in bag and seal. Mail package back to [place] as soon as  possible. Your physician should have your final report approximately 7 days after you have mailed back  your monitor. Call [facility] Customer Care at [phone] if you have questions regarding your ZIO AT  patch monitor. Call them immediately if you see an orange light blinking on your monitor.  If your monitor falls off in less than 4 days, contact our Monitor department at [phone]. If your  monitor becomes loose or falls off after 4 days call [employer] at [phone] for suggestions on  securing your monitor    Follow-Up: At [facility], you and your health needs are our priority.  As part of our continuing mission to provide you with exceptional heart care, we have created designated Provider Care Teams.  These Care Teams include your primary Cardiologist (physician) and Advanced Practice Providers (APPs -  Physician Assistants and Nurse Practitioners) who all work together to provide you with the care you need, when you need it.   Your next appointment:   2 month(s)  Provider:   Francis Dowse, PA-C

## 2022-12-08 DIAGNOSIS — Z01810 Encounter for preprocedural cardiovascular examination: Secondary | ICD-10-CM | POA: Diagnosis not present

## 2022-12-08 DIAGNOSIS — R42 Dizziness and giddiness: Secondary | ICD-10-CM

## 2022-12-08 DIAGNOSIS — I456 Pre-excitation syndrome: Secondary | ICD-10-CM

## 2022-12-08 DIAGNOSIS — R002 Palpitations: Secondary | ICD-10-CM

## 2022-12-13 ENCOUNTER — Other Ambulatory Visit: Payer: Self-pay | Admitting: Neurology

## 2022-12-14 NOTE — Telephone Encounter (Signed)
Last seen on 10/20/22 Follow up scheduled on 05/04/23 Last filled on 10/12/22 Ativan 0.5 mg tablets #30 tablets(30 day supply) Rx pending to be signed

## 2022-12-20 ENCOUNTER — Encounter: Payer: Self-pay | Admitting: Urology

## 2022-12-21 ENCOUNTER — Other Ambulatory Visit: Payer: Self-pay

## 2022-12-21 ENCOUNTER — Other Ambulatory Visit: Payer: Medicare Other

## 2022-12-21 DIAGNOSIS — N390 Urinary tract infection, site not specified: Secondary | ICD-10-CM

## 2022-12-21 LAB — URINALYSIS, ROUTINE W REFLEX MICROSCOPIC
Bilirubin, UA: NEGATIVE
Glucose, UA: NEGATIVE
Ketones, UA: NEGATIVE
Nitrite, UA: NEGATIVE
Specific Gravity, UA: 1.025 (ref 1.005–1.030)
Urobilinogen, Ur: 0.2 mg/dL (ref 0.2–1.0)
pH, UA: 6 (ref 5.0–7.5)

## 2022-12-21 LAB — MICROSCOPIC EXAMINATION: WBC, UA: >30 /[HPF] — AB (ref 0–5)

## 2022-12-23 ENCOUNTER — Other Ambulatory Visit: Payer: Self-pay | Admitting: Urology

## 2022-12-23 ENCOUNTER — Encounter: Payer: Self-pay | Admitting: Urology

## 2022-12-23 DIAGNOSIS — R8290 Unspecified abnormal findings in urine: Secondary | ICD-10-CM | POA: Diagnosis not present

## 2022-12-23 LAB — URINE CULTURE

## 2022-12-23 MED ORDER — SULFAMETHOXAZOLE-TRIMETHOPRIM 800-160 MG PO TABS: 1.0000 | ORAL_TABLET | Freq: Two times a day (BID) | ORAL | 0 refills | Status: AC

## 2022-12-28 ENCOUNTER — Encounter: Payer: Self-pay | Admitting: Urology

## 2022-12-28 ENCOUNTER — Telehealth: Payer: Self-pay | Admitting: Urology

## 2022-12-28 ENCOUNTER — Other Ambulatory Visit: Payer: Self-pay | Admitting: Urology

## 2022-12-28 DIAGNOSIS — R8290 Unspecified abnormal findings in urine: Secondary | ICD-10-CM

## 2022-12-28 MED ORDER — LEVOFLOXACIN 500 MG PO TABS: 500.0000 mg | ORAL_TABLET | Freq: Every day | ORAL | 0 refills | Status: DC

## 2022-12-28 NOTE — Telephone Encounter (Signed)
Resolve Mdx culture grew Klebsiella. Rx for Levaquin x 7 days sent to pharmacy. Patient notified.

## 2022-12-29 ENCOUNTER — Telehealth: Payer: Self-pay

## 2022-12-29 NOTE — Telephone Encounter (Signed)
Faxed form to [facility] [phone] on 12/29/2022

## 2022-12-30 ENCOUNTER — Encounter: Payer: Self-pay | Admitting: Urology

## 2022-12-30 ENCOUNTER — Ambulatory Visit: Payer: Medicare Other | Admitting: Urology

## 2022-12-30 VITALS — BP 120/73 | HR 64

## 2022-12-30 DIAGNOSIS — N390 Urinary tract infection, site not specified: Secondary | ICD-10-CM | POA: Diagnosis not present

## 2022-12-30 DIAGNOSIS — Z09 Encounter for follow-up examination after completed treatment for conditions other than malignant neoplasm: Secondary | ICD-10-CM | POA: Diagnosis not present

## 2022-12-30 DIAGNOSIS — Z87442 Personal history of urinary calculi: Secondary | ICD-10-CM | POA: Diagnosis not present

## 2022-12-30 DIAGNOSIS — Z87440 Personal history of urinary (tract) infections: Secondary | ICD-10-CM | POA: Diagnosis not present

## 2022-12-30 LAB — URINALYSIS, ROUTINE W REFLEX MICROSCOPIC
Bilirubin, UA: NEGATIVE
Glucose, UA: NEGATIVE
Ketones, UA: NEGATIVE
Leukocytes,UA: NEGATIVE
Nitrite, UA: NEGATIVE
Protein,UA: NEGATIVE
RBC, UA: NEGATIVE
Specific Gravity, UA: >1.03 — ABNORMAL HIGH (ref 1.005–1.030)
Urobilinogen, Ur: 0.2 mg/dL (ref 0.2–1.0)
pH, UA: 5.5 (ref 5.0–7.5)

## 2022-12-30 NOTE — Progress Notes (Signed)
Assessment: 1. Frequent UTI   2. History of nephrolithiasis    Plan: Continue methods to reduce the risk of UTIs including increased fluid intake, timed and double voiding, daily cranberry supplement, daily probiotics, use of vaginal hormone cream. Continue vaginal hormone cream 2-3 times/week.  Complete Levaquin x 7 days. She will try to resume the methenamine.  She will contact my office if she is unable to tolerate this. Return to office in 2 months.  Chief Complaint:  Chief Complaint  Patient presents with   Frequent UTI    History of Present Illness:  Connie Haynes is a 67 y.o. female who is seen for further evaluation of frequent UTI's and history of nephrolithiasis. She has been treated for frequent UTIs.  No typical UTI symptoms.  No dysuria or gross hematuria.  No fevers, chills, or flank pain.  She does occasionally notice some cloudy urine with a strong odor.  She was last treated for a UTI in May 2024.  Urine culture results: 6/21 50-100K E. Coli 6/22 <10K mixed flora 11/22 >100K Klebsiella 1/24 50-100K Enterobacter 3/24 <10K mixed flora 4/24 Mixed flora 5/24 10-49K Enterococcus 6/24 Mixed flora  She is unaware of the need to void.  She does not have a sensation of bladder fullness.  She voids on a schedule both daytime and nighttime.  She is not sure if she empties her bladder completely.  She has occasional episodes of incontinence without awareness.  These symptoms have been present since her diagnosis of transverse myelitis in 2021.  She has a history of kidney stones.  She required stent placement for a infected stone in 2015.  She reports passing a stone in June 2024.  PVR = 21 ml. Resolve [facility]X urine culture from 08/04/2022 grew Enterococcus and Klebsiella.  She was treated with Augmentin x 7 days. CT abdomen and pelvis without contrast from 08/21/2022 showed no renal or ureteral calculi, no evidence of obstruction or other urologic abnormalities.  She noted  some improvement in her voiding symptoms after completing the antibiotics.  She resumed her regular voiding symptoms with hesitancy and sensation of incomplete emptying.  She also noted a decreased stream.  She does not have a awareness of her need to void so she continues to void on a schedule.  No dysuria or gross hematuria.  No flank pain.  No fevers or chills. Urine culture from 09/01/2022 grew 50-100 K Enterobacter.  She was treated with Bactrim and started on daily Macrobid for UTI prevention. She discontinued the Macrobid due to nausea and vomiting. At her visit in August 2024, she was doing well without any new urinary symptoms.  No UTI symptoms. She continued on Premarin vaginal cream and was started on methenamine twice daily. At her visit in September 2024, she continued on methenamine and was doing well.  She had not had any recent UTI symptoms.  She had discontinued the methenamine for approximately 1 month due to GI symptoms with the medication. She developed flank pain in early November 2024.  She was concerned about a possible UTI.  Resolve MDX urine culture grew Klebsiella.  She was treated with Levaquin x 7 days.  She has taken 3 days of the medication.  Her flank pain has resolved.  No other symptoms.   Portions of the above documentation were copied from a prior visit for review purposes only.   Past Medical History:  Past Medical History:  Diagnosis Date   Adrenal insufficiency (HCC)    Anemia  denies   Anxiety    Basal cell carcinoma    eye lid   Chronic kidney disease, stage 3 (HCC)    Cough    hx. dry cough chronic   Disorder of tear duct system    frequent tearing of eyes   GERD (gastroesophageal reflux disease)    H/O: whooping cough 2000   History of kidney infection 2015   ? sepsis   History of kidney stones    History of kidney stones    Hyperlipidemia    Hypertension    IBS (irritable bowel syndrome)    controlled with med   Osteoarthritis     arthritis -joints-limited ROM shoulders more on right   PONV (postoperative nausea and vomiting)     per pt hx of low BP following shoulder surgery in 05-2015; was managed in hospital with IV fluids and d/c BP being taken a tthe time; pt was dced and F/U with endcrinoopgist who dx with adrenal insurffiency and started steroid therapy; treatmetnt with positive outome, now managed chronically on hydrocortisone tablets;   Sleep apnea    no longer using cpap   Stroke [facility])    per patient "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 Conni Slipper)   APPENDECTOMY     CARDIAC ELECTROPHYSIOLOGY STUDY AND ABLATION     '93 for WPW syndrome   CARPAL TUNNEL RELEASE Right 12/21/2014   Procedure: RIGHT CARPAL TUNNEL RELEASE ;  Surgeon: Beverely Low, MD;  Location: [facility] OR;  Service: Orthopedics;  Laterality: Right;   CARPAL TUNNEL RELEASE Left    CERVICAL SPINE SURGERY     cervical fusion with hardware retained   COLON SURGERY     "colon detached from abdomen"   COLONOSCOPY WITH PROPOFOL N/A 05/10/2014   Procedure: COLONOSCOPY WITH PROPOFOL;  Surgeon: Charna Elizabeth, MD;  Location: [facility];  Service: Endoscopy;  Laterality: N/A;   CYSTOSCOPY/RETROGRADE/URETEROSCOPY Left 12/18/2013   Procedure: CYSTOSCOPY/RETROGRADE/LEFT STENT;  Surgeon: Anner Crete, MD;  Location: [facility];  Service: Urology;  Laterality: Left;   ESOPHAGOGASTRODUODENOSCOPY (EGD) WITH PROPOFOL N/A 05/10/2014   Procedure: ESOPHAGOGASTRODUODENOSCOPY (EGD) WITH PROPOFOL;  Surgeon: Charna Elizabeth, MD;  Location: [facility];   Service: Endoscopy;  Laterality: N/A;   EYE SURGERY     EYELID CARCINOMA EXCISION     INSERTION OF DIALYSIS CATHETER N/A 08/16/2019   Procedure: INSERTION OF DIALYSIS CATHETER;  Surgeon: Josephine Igo, DO;  Location: [facility];  Service: Pulmonary;  Laterality: N/A;   JOINT REPLACEMENT     KNEE ARTHROSCOPY Left 07/16/2016   REVERSE SHOULDER ARTHROPLASTY Right 06/29/2014   Procedure: REVERSE TOTAL SHOULDER ARTHROPLASTY;  Surgeon: Beverely Low, MD;  Location: [facility] OR;  Service: Orthopedics;  Laterality: Right;   REVISION TOTAL SHOULDER TO REVERSE TOTAL SHOULDER Left 05/15/2015   REVISION TOTAL SHOULDER TO REVERSE TOTAL SHOULDER Left 05/15/2015   Procedure: LEFT SHOULDER REVISION TOTAL SHOULDER ARTHRPLASTY TO REVERSE TOTAL SHOULDER ARTHROPLASTY ;  Surgeon: Beverely Low, MD;  Location: [facility] OR;  Service: Orthopedics;  Laterality: Left;   SPINE  SURGERY     tear duct surgery     TOTAL ABDOMINAL HYSTERECTOMY     TOTAL HIP ARTHROPLASTY     x 3(rt x2, lt x1)   TOTAL KNEE ARTHROPLASTY Right 08/08/2013   Procedure: RIGHT TOTAL KNEE ARTHROPLASTY WITH SAPHENOUS NERVE SCAR EXCISION;  Surgeon: Shelda Pal, MD;  Location: [facility];  Service: Orthopedics;  Laterality: Right;   TOTAL KNEE ARTHROPLASTY Left 08/25/2016   Procedure: LEFT TOTAL KNEE ARTHROPLASTY;  Surgeon: Durene Romans, MD;  Location: [facility];  Service: Orthopedics;  Laterality: Left;  70 mins; Adductor block   TOTAL SHOULDER ARTHROPLASTY Left 07/15/2012   Procedure: LEFT TOTAL SHOULDER ARTHROPLASTY;  Surgeon: Verlee Rossetti, MD;  Location[facility] OR;  Service: Orthopedics;  Laterality: Left;   TOTAL SHOULDER REVISION Right 12/21/2014   Procedure: RIGHT SHOULDER POLY EXCHANGE ;  Surgeon: Beverely Low, MD;  Location: [facility] OR;  Service: Orthopedics;  Laterality: Right;   URETEROSCOPY  stent placement    Allergies:  Allergies  Allergen Reactions   Codeine Nausea And Vomiting    Can take if takes phernergan   Hydrocodone Nausea Only    Lamotrigine     "dizziness, shakiness and just feeling not right."   Macrobid [Nitrofurantoin] Diarrhea and Nausea And Vomiting   Robaxin [Methocarbamol] Rash    Family History:  Family History  Problem Relation Age of Onset   Leukemia Brother    Heart disease Mother    Hypertension Mother    Hyperlipidemia Mother    Heart attack Mother    Heart failure Mother    Heart disease Maternal Grandfather    Heart attack Maternal Grandfather    Cancer Paternal Grandfather    Lung cancer Paternal Grandfather    Cancer Maternal Uncle        bone    Social History:  Social History   Tobacco Use   Smoking status: Never   Smokeless tobacco: Never  Vaping Use   Vaping status: Never Used  Substance Use Topics   Alcohol use: No    Alcohol/week: 0.0 standard drinks of alcohol   Drug use: No    ROS: Constitutional:  Negative for fever, chills, weight loss CV: Negative for chest pain, previous MI, hypertension Respiratory:  Negative for shortness of breath, wheezing, sleep apnea, frequent cough GI:  Negative for nausea, vomiting, bloody stool, GERD  Physical exam: BP 120/73   Pulse 64  GENERAL APPEARANCE:  Well appearing, well developed, well nourished, NAD HEENT:  Atraumatic, normocephalic, oropharynx clear NECK:  Supple without lymphadenopathy or thyromegaly ABDOMEN:  Soft, non-tender, no masses EXTREMITIES:  Moves all extremities well, without clubbing, cyanosis, or edema NEUROLOGIC:  Alert and oriented x 3, normal gait, CN II-XII grossly intact MENTAL STATUS:  appropriate BACK:  Non-tender to palpation, No CVAT SKIN:  Warm, dry, and intact   Results: U/A: Negative

## 2023-01-01 ENCOUNTER — Ambulatory Visit (HOSPITAL_COMMUNITY): Payer: Medicare Other | Attending: Cardiology

## 2023-01-01 DIAGNOSIS — R0609 Other forms of dyspnea: Secondary | ICD-10-CM | POA: Insufficient documentation

## 2023-01-01 DIAGNOSIS — Z01810 Encounter for preprocedural cardiovascular examination: Secondary | ICD-10-CM | POA: Diagnosis not present

## 2023-01-01 LAB — ECHOCARDIOGRAM COMPLETE
Area-P 1/2: 3.03 cm2
S' Lateral: 3.3 cm

## 2023-01-04 ENCOUNTER — Other Ambulatory Visit: Payer: Self-pay | Admitting: Neurology

## 2023-01-04 ENCOUNTER — Encounter: Payer: Self-pay | Admitting: Family

## 2023-01-04 ENCOUNTER — Encounter: Payer: Self-pay | Admitting: Neurology

## 2023-01-04 MED ORDER — LORAZEPAM 0.5 MG PO TABS: ORAL_TABLET | ORAL | 0 refills | Status: DC

## 2023-01-04 NOTE — Telephone Encounter (Signed)
Last seen on 10/20/22 Follow up scheduled on 05/04/22 Last filled on 12/14/22 #30 tablets (30 day supply) Rx pending to be signed

## 2023-01-04 NOTE — Telephone Encounter (Signed)
Last seen on 10/20/22 Follow up scheduled on 05/04/23

## 2023-01-05 ENCOUNTER — Other Ambulatory Visit: Payer: Self-pay | Admitting: Family

## 2023-01-05 DIAGNOSIS — E2740 Unspecified adrenocortical insufficiency: Secondary | ICD-10-CM

## 2023-01-24 NOTE — Progress Notes (Unsigned)
Cardiology Office Note Date:  01/24/2023  Patient ID:  Connie Haynes, Connie Haynes 03-15-1955, MRN [id number] PCP:  Connie Bass, FNP  Cardiologist:  Dr. Graciela Husbands    Chief Complaint:  surgical clearance, palpitations   History of Present Illness: Connie Haynes is a 67 y.o. female with history of WPW (s/p ablation 1993), HTN, HLD, IBS, OA, Addison's  disease/adrenal insufficiency follows with Dr. Katrinka Blazing, transverse myelitis, neuromyelitis  (endocrinologist at [facility], also managing steroids peri-op).     She saw Dr. Graciela Husbands 07/03/21, c/o CP, discussed palpitations historically to be PACs Monitor noted brief AFib less then a minuite, planned for monitoring via her wearable tech, to notify if elevated HRs of hours in duration. Edema treated with lasix  She saw Mardelle Matte 08/06/22, palpitations were improved as was her edema. Reported symptoms with low BPs at home.  Discussed medication adjustments, though she was hesitant to make any and planned to f/u with her PMD.  Pending R shoulder surgery, not yet scheduled Telephone visit with pre-op APP she reportesd palpitations/rapid HRs perhaps a minute in duration, planned for EP follow up and comment on her pre-op evaluation as well.  RCRI is zero (0.4%)  TODAY She reports hx of several b/l shoulder surgeries/replacements, her R shoulder now having spontaneous dislocations and they are discussing another surgery for this.  Though her orthopedic team not as enthusiastic about doing surgery with her comorbid conditions  -----She reports a few months of more then usual DOE She walks as often and as regularly as she can and in the last few months notes more winded, more difficult to complete/go as far. ------New or different palpitations Different the the PVCs, more reminiscent of her WPW tachycardia though there are very brief, lasting only seconds where her WPW tachycardia lasted a really long time. These started up a couple months ago as well Notes her HRs  via watch anywhere from 40-194 She is very aware of the rapid palpitations, they hit her suddenly, feel pressure in her chest/head with them and reflexively causes her to gasp/take in a deep breath They leave feeling tired Not associated with dizziness/near syncope ------Low BPs' these do seem associate with when she has a UTI, SBPs in the 80's and she does feel very weak/lightheaded with them This has been discussed before and has been advised to hold her coreg days her BP is low. She regularly gets her PM coreg in, morning does is on/off depending on where her BP starts Her palpitations are not associated with skipped coreg days   She has not  pattern to this, can go days maybe weeks without then it happens, not positional, exertional,  ----- She gets her infusion tx every 66mo, she can always tell when she is getting close, her neuropathy with start up, legs will get numb and very weak >> following he infusion for a couple weeks feels awful from the medication but then has a few months of "her normal, more functional" DOE and palps do not seem connected to infusion timing  Planned for monitor and echo She was uncertain on plans for surgery  MONITOR not fpormally read yet My review SR, PACs Rare PAT 6.8 sec  NSVT longest 8 beats  Symptoms with/without ectopy  TTE w/LVEF 60-65%, no WMA, RV OK, mild AI  *** symptoms *** surgery? *** BPs   Past Medical History:  Diagnosis Date   Adrenal insufficiency (HCC)    Anemia    denies   Anxiety  Basal cell carcinoma    eye lid   Chronic kidney disease, stage 3 (HCC)    Cough    hx. dry cough chronic   Disorder of tear duct system    frequent tearing of eyes   GERD (gastroesophageal reflux disease)    H/O: whooping cough 2000   History of kidney infection 2015   ? sepsis   History of kidney stones    History of kidney stones    Hyperlipidemia    Hypertension    IBS (irritable bowel syndrome)    controlled with med    Osteoarthritis    arthritis -joints-limited ROM shoulders more on right   PONV (postoperative nausea and vomiting)     per pt hx of low BP following shoulder surgery in 05-2015; was managed in hospital with IV fluids and d/c BP being taken a tthe time; pt was dced and F/U with endcrinoopgist who dx with adrenal insurffiency and started steroid therapy; treatmetnt with positive outome, now managed chronically on hydrocortisone tablets;   Sleep apnea    no longer using cpap   Stroke [facility])    per patient "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 Conni Slipper)   APPENDECTOMY     CARDIAC ELECTROPHYSIOLOGY STUDY AND ABLATION     '93 for WPW syndrome   CARPAL TUNNEL RELEASE Right 12/21/2014   Procedure: RIGHT CARPAL TUNNEL RELEASE ;  Surgeon: Beverely Low, MD;  Location: [facility] OR;  Service: Orthopedics;  Laterality: Right;   CARPAL TUNNEL RELEASE Left    CERVICAL SPINE SURGERY     cervical fusion with hardware retained   COLON SURGERY     "colon detached from abdomen"   COLONOSCOPY WITH PROPOFOL N/A 05/10/2014   Procedure: COLONOSCOPY WITH PROPOFOL;  Surgeon: Charna Elizabeth, MD;  Location: [facility];  Service: Endoscopy;  Laterality: N/A;   CYSTOSCOPY/RETROGRADE/URETEROSCOPY Left 12/18/2013   Procedure: CYSTOSCOPY/RETROGRADE/LEFT STENT;  Surgeon: Anner Crete, MD;  Location: [facility] ORS;  Service: Urology;  Laterality: Left;   ESOPHAGOGASTRODUODENOSCOPY (EGD) WITH PROPOFOL N/A 05/10/2014   Procedure: ESOPHAGOGASTRODUODENOSCOPY (EGD) WITH PROPOFOL;  Surgeon: Charna Elizabeth, MD;  Location: [facility];   Service: Endoscopy;  Laterality: N/A;   EYE SURGERY     EYELID CARCINOMA EXCISION     INSERTION OF DIALYSIS CATHETER N/A 08/16/2019   Procedure: INSERTION OF DIALYSIS CATHETER;  Surgeon: Josephine Igo, DO;  Location: [facility];  Service: Pulmonary;  Laterality: N/A;   JOINT REPLACEMENT     KNEE ARTHROSCOPY Left 07/16/2016   REVERSE SHOULDER ARTHROPLASTY Right 06/29/2014   Procedure: REVERSE TOTAL SHOULDER ARTHROPLASTY;  Surgeon: Beverely Low, MD;  Location: [facility] OR;  Service: Orthopedics;  Laterality: Right;   REVISION TOTAL SHOULDER TO REVERSE TOTAL SHOULDER Left 05/15/2015   REVISION TOTAL SHOULDER TO REVERSE TOTAL SHOULDER Left 05/15/2015   Procedure: LEFT SHOULDER REVISION TOTAL SHOULDER ARTHRPLASTY TO REVERSE TOTAL SHOULDER ARTHROPLASTY ;  Surgeon: Beverely Low, MD;  Location: [facility] OR;  Service: Orthopedics;  Laterality: Left;   SPINE SURGERY     tear duct surgery  TOTAL ABDOMINAL HYSTERECTOMY     TOTAL HIP ARTHROPLASTY     x 3(rt x2, lt x1)   TOTAL KNEE ARTHROPLASTY Right 08/08/2013   Procedure: RIGHT TOTAL KNEE ARTHROPLASTY WITH SAPHENOUS NERVE SCAR EXCISION;  Surgeon: Shelda Pal, MD;  Location: [facility];  Service: Orthopedics;  Laterality: Right;   TOTAL KNEE ARTHROPLASTY Left 08/25/2016   Procedure: LEFT TOTAL KNEE ARTHROPLASTY;  Surgeon: Durene Romans, MD;  Location: [facility];  Service: Orthopedics;  Laterality: Left;  70 mins; Adductor block   TOTAL SHOULDER ARTHROPLASTY Left 07/15/2012   Procedure: LEFT TOTAL SHOULDER ARTHROPLASTY;  Surgeon: Verlee Rossetti, MD;  Location: [facility] OR;  Service: Orthopedics;  Laterality: Left;   TOTAL SHOULDER REVISION Right 12/21/2014   Procedure: RIGHT SHOULDER POLY EXCHANGE ;  Surgeon: Beverely Low, MD;  Location: [facility] OR;  Service: Orthopedics;  Laterality: Right;   URETEROSCOPY  stent placement    Current Outpatient Medications  Medication Sig Dispense Refill   carvedilol (COREG) 25 MG tablet Take 1 tablet (25 mg total) by mouth 2 (two)  times daily. 180 tablet 3   citalopram (CELEXA) 40 MG tablet Take 1 tablet (40 mg total) by mouth daily. 90 tablet 3   estradiol (ESTRACE) 0.1 MG/GM vaginal cream Place 2-3 Applicatorfuls vaginally once a week.     furosemide (LASIX) 40 MG tablet TAKE 2 TABLETS BY MOUTH EVERY OTHER DAY 90 tablet 3   hydrocortisone (CORTEF) 10 MG tablet Take 5-10 mg by mouth See admin instructions. Take 10mg  in the AM and 5mg  in the afternoon.     levofloxacin (LEVAQUIN) 500 MG tablet Take 1 tablet (500 mg total) by mouth daily. 7 tablet 0   LINZESS 72 MCG capsule Take 1 capsule (72 mcg total) by mouth daily. 90 capsule 3   LORazepam (ATIVAN) 0.5 MG tablet TAKE 1 TABLET BY MOUTH ONCE DAILY AS NEEDED 30 tablet 0   methenamine (HIPREX) 1 g tablet Take 1 tablet (1 g total) by mouth 2 (two) times daily with a meal. 60 tablet 3   Multiple Vitamin (MULTIVITAMIN ADULT PO) Take 1 tablet by mouth daily.     ondansetron (ZOFRAN) 4 MG tablet Take 4 mg by mouth every 8 (eight) hours as needed.     potassium chloride (KLOR-CON) 10 MEQ tablet Take 10 mEq by mouth as needed. WITH LASIX     pramipexole (MIRAPEX) 1 MG tablet TAKE 1 PO BID 180 tablet 3   predniSONE (DELTASONE) 20 MG tablet Take 3 pills po the day before infusions. 12 tablet 0   riTUXimab (RITUXAN IV) Inject into the vein.     telmisartan-hydrochlorothiazide (MICARDIS HCT) 80-12.5 MG tablet Take 1 tablet by mouth daily. 90 tablet 3   tiZANidine (ZANAFLEX) 4 MG tablet TAKE 1 TO 1 & 1/2 (ONE & ONE-HALF) TABLETS BY MOUTH THREE TIMES DAILY AS NEEDED 270 tablet 2   traMADol (ULTRAM) 50 MG tablet Take 1 tablet (50 mg total) by mouth every 8 (eight) hours as needed. 90 tablet 2   No current facility-administered medications for this visit.   Facility-Administered Medications Ordered in Other Visits  Medication Dose Route Frequency Provider Last Rate Last Admin   chlorhexidine (HIBICLENS) 4 % liquid 4 application  60 mL Topical Once Dixon, Katharina Caper, PA-C        technetium tetrofosmin (TC-MYOVIEW) injection 31.7 milli Curie  31.7 millicurie Intravenous Once PRN Lars Masson, MD        Allergies:   Codeine, Hydrocodone, Lamotrigine, Macrobid [nitrofurantoin], and  Robaxin [methocarbamol]   Social History:  The patient  reports that she has never smoked. She has never used smokeless tobacco. She reports that she does not drink alcohol and does not use drugs.   Family History:  The patient's family history includes Cancer in her maternal uncle and paternal grandfather; Heart attack in her maternal grandfather and mother; Heart disease in her maternal grandfather and mother; Heart failure in her mother; Hyperlipidemia in her mother; Hypertension in her mother; Leukemia in her brother; Lung cancer in her paternal grandfather.  ROS:  Please see the history of present illness.    All other systems are reviewed and otherwise negative.   PHYSICAL EXAM:  VS:  There were no vitals taken for this visit. BMI: There is no height or weight on file to calculate BMI. Well nourished, well developed, in no acute distress  HEENT: normocephalic, atraumatic  Neck: no JVD, carotid bruits or masses Cardiac: ***  RRR; no significant murmurs, no rubs, or gallops Lungs: *** CTA b/l, no wheezing, rhonchi or rales  Abd: soft, nontender MS: no deformity or atrophy Ext: *** no pitting edema  Skin: warm and dry, no rash Neuro:  No gross deficits appreciated Psych: euthymic mood, full affect   EKG:  not done today  07/24/21: stress myoview  The study is normal. The study is low risk.   No ST deviation was noted.   LV perfusion is normal. There is no evidence of ischemia. There is no evidence of infarction.   Left ventricular function is normal. Nuclear stress EF: 69 %. The left ventricular ejection fraction is hyperdynamic (>65%). End diastolic cavity size is normal.   Prior study available for comparison from 08/22/2015.   No significant changes from previous study  from August 22, 2015   11/22/20: TTE  1. Left ventricular ejection fraction, by estimation, is 60 to 65%. Left  ventricular ejection fraction by 3D volume is 65 %. The left ventricle has  normal function. The left ventricle has no regional wall motion  abnormalities. Left ventricular diastolic   parameters were normal. The average left ventricular global longitudinal  strain is -25.6 %. The global longitudinal strain is normal.   2. Right ventricular systolic function is normal. The right ventricular  size is normal. There is mildly elevated pulmonary artery systolic  pressure. The estimated right ventricular systolic pressure is 40.7 mmHg.   3. The mitral valve is normal in structure. No evidence of mitral valve  regurgitation.   4. The aortic valve is tricuspid. Aortic valve regurgitation is mild. No  aortic stenosis is present.   5. The inferior vena cava is dilated in size with >50% respiratory  variability, suggesting right atrial pressure of 8 mmHg.   Comparison(s): No prior Echocardiogram.    Sept 2022: monitor Patient had a min HR of 49 bpm, max HR of 184 bpm, and avg HR of 68 bpm. Predominant underlying rhythm was Sinus Rhythm. 2 Ventricular Tachycardia runs occurred, the run with the fastest interval lasting 5 beats with a max rate of 174 bpm, the longest  lasting 5 beats with an avg rate of 163 bpm. 46 Supraventricular Tachycardia runs occurred, the run with the fastest interval lasting 5 beats with a max rate of 184 bpm, the longest lasting 27.7 secs with an avg rate of 106 bpm. Isolated SVEs were rare  (<1.0%), SVE Couplets were rare (<1.0%), and SVE Triplets were rare (<1.0%). Isolated VEs were rare (<1.0%), and no VE Couplets or VE  Triplets were present.   08/22/15: Lexiscan stress test Nuclear stress EF: 76%. There was no ST segment deviation noted during stress. This is a low risk study. Basal septal wall decreased radiotracer uptake seen at both rest and stress, no  ischemia identified.  Recent Labs: 06/04/2022: ALT 10; Hemoglobin 13.2; Platelets 228.0 12/04/2022: BUN 16; Creatinine, Ser 0.92; Magnesium 2.1; Potassium 3.9; Sodium 139  No results found for requested labs within last 365 days.   CrCl cannot be calculated (Patient's most recent lab result is older than the maximum 21 days allowed.).   Wt Readings from Last 3 Encounters:  12/04/22 295 lb 9.6 oz (134.1 kg)  11/04/22 291 lb (132 kg)  10/27/22 291 lb (132 kg)     Other studies reviewed: Additional studies/records reviewed today include: summarized above  ASSESSMENT AND PLAN:  1. HTN     Looks good today, no changes     ***  2. WPW, ablated     no obvious pre-excitation is appreciated on EKG     ***  3. Adrenal insufficiency     Follows with endocriology     Chronic steroids  4. DOE     Perhaps multifactorial     No CP, neg stress myoview last yer     ***   Disposition: ***    Current medicines are reviewed at length with the patient today.  The patient did not have any concerns regarding medicines.  Judith Blonder, PA-C 01/24/2023 8:14 AM     [facility] [address] [place] [address] [phone] (office)  [phone] (fax)

## 2023-01-26 ENCOUNTER — Ambulatory Visit: Payer: Medicare Other | Attending: Physician Assistant | Admitting: Physician Assistant

## 2023-01-26 VITALS — BP 122/78 | HR 66 | Ht 65.0 in | Wt 293.0 lb

## 2023-01-26 DIAGNOSIS — R0609 Other forms of dyspnea: Secondary | ICD-10-CM

## 2023-01-26 DIAGNOSIS — I4719 Other supraventricular tachycardia: Secondary | ICD-10-CM | POA: Diagnosis not present

## 2023-01-26 DIAGNOSIS — Z01810 Encounter for preprocedural cardiovascular examination: Secondary | ICD-10-CM

## 2023-01-26 DIAGNOSIS — I10 Essential (primary) hypertension: Secondary | ICD-10-CM

## 2023-01-26 DIAGNOSIS — R002 Palpitations: Secondary | ICD-10-CM

## 2023-01-26 MED ORDER — METOPROLOL TARTRATE 25 MG PO TABS: 25.0000 mg | ORAL_TABLET | Freq: Three times a day (TID) | ORAL | 0 refills | Status: DC | PRN

## 2023-01-26 NOTE — Patient Instructions (Signed)
Medication Instructions:   START TAKING EVERY 8 HOURS ONLY AS NEEDED FOR PALPITATIONS: METOPROLOL 25 MG   *If you need a refill on your cardiac medications before your next appointment, please call your pharmacy*   Lab Work: NONE ORDERED  TODAY    If you have labs (blood work) drawn today and your tests are completely normal, you will receive your results only by: MyChart Message (if you have MyChart) OR A paper copy in the mail If you have any lab test that is abnormal or we need to change your treatment, we will call you to review the results.   Testing/Procedures: NONE ORDERED  TODAY    Follow-Up: At [facility], you and your health needs are our priority.  As part of our continuing mission to provide you with exceptional heart care, we have created designated Provider Care Teams.  These Care Teams include your primary Cardiologist (physician) and Advanced Practice Providers (APPs -  Physician Assistants and Nurse Practitioners) who all work together to provide you with the care you need, when you need it.  We recommend signing up for the patient portal called "MyChart".  Sign up information is provided on this After Visit Summary.  MyChart is used to connect with patients for Virtual Visits (Telemedicine).  Patients are able to view lab/test results, encounter notes, upcoming appointments, etc.  Non-urgent messages can be sent to your provider as well.   To learn more about what you can do with MyChart, go to [email or web address].    Your next appointment:    2-3 month(s) ( CONTACT  CASSIE HALL/ ANGELINE HAMMER FOR EP SCHEDULING ISSUES )   Provider:    Sherryl Manges, MD or Francis Dowse, PA-C     Other Instructions

## 2023-01-29 ENCOUNTER — Encounter: Payer: Self-pay | Admitting: Urology

## 2023-01-30 ENCOUNTER — Other Ambulatory Visit: Payer: Self-pay | Admitting: Urology

## 2023-01-30 DIAGNOSIS — N390 Urinary tract infection, site not specified: Secondary | ICD-10-CM

## 2023-01-30 MED ORDER — LEVOFLOXACIN 500 MG PO TABS: 500.0000 mg | ORAL_TABLET | Freq: Every day | ORAL | 0 refills | Status: DC

## 2023-01-30 MED ORDER — FOSFOMYCIN TROMETHAMINE 3 G PO PACK: PACK | ORAL | 3 refills | Status: DC

## 2023-02-15 ENCOUNTER — Encounter: Payer: Self-pay | Admitting: Urology

## 2023-02-15 ENCOUNTER — Other Ambulatory Visit: Payer: Self-pay | Admitting: Urology

## 2023-02-15 DIAGNOSIS — N390 Urinary tract infection, site not specified: Secondary | ICD-10-CM

## 2023-02-15 MED ORDER — LEVOFLOXACIN 500 MG PO TABS: 500.0000 mg | ORAL_TABLET | Freq: Every day | ORAL | 0 refills | Status: AC

## 2023-02-23 ENCOUNTER — Encounter: Payer: Self-pay | Admitting: Neurology

## 2023-02-23 ENCOUNTER — Encounter: Payer: Self-pay | Admitting: Family

## 2023-02-25 ENCOUNTER — Other Ambulatory Visit: Payer: Self-pay | Admitting: Family

## 2023-02-25 DIAGNOSIS — E271 Primary adrenocortical insufficiency: Secondary | ICD-10-CM

## 2023-02-26 ENCOUNTER — Other Ambulatory Visit: Payer: Self-pay

## 2023-02-26 ENCOUNTER — Other Ambulatory Visit: Payer: Self-pay | Admitting: Neurology

## 2023-02-26 MED ORDER — LORAZEPAM 0.5 MG PO TABS: ORAL_TABLET | ORAL | 0 refills | Status: DC

## 2023-03-01 ENCOUNTER — Encounter: Payer: Self-pay | Admitting: Urology

## 2023-03-01 ENCOUNTER — Other Ambulatory Visit: Payer: Self-pay | Admitting: Neurology

## 2023-03-01 ENCOUNTER — Ambulatory Visit: Payer: Medicare Other | Admitting: Urology

## 2023-03-01 VITALS — BP 134/77 | HR 61 | Ht 65.0 in | Wt 293.0 lb

## 2023-03-01 DIAGNOSIS — Z87440 Personal history of urinary (tract) infections: Secondary | ICD-10-CM

## 2023-03-01 DIAGNOSIS — M545 Low back pain, unspecified: Secondary | ICD-10-CM

## 2023-03-01 DIAGNOSIS — Z87442 Personal history of urinary calculi: Secondary | ICD-10-CM | POA: Diagnosis not present

## 2023-03-01 DIAGNOSIS — N390 Urinary tract infection, site not specified: Secondary | ICD-10-CM | POA: Diagnosis not present

## 2023-03-01 LAB — URINALYSIS, ROUTINE W REFLEX MICROSCOPIC
Bilirubin, UA: NEGATIVE
Glucose, UA: NEGATIVE
Ketones, UA: NEGATIVE
Leukocytes,UA: NEGATIVE
Nitrite, UA: NEGATIVE
Protein,UA: NEGATIVE
RBC, UA: NEGATIVE
Specific Gravity, UA: >1.03 — ABNORMAL HIGH (ref 1.005–1.030)
Urobilinogen, Ur: 0.2 mg/dL (ref 0.2–1.0)
pH, UA: 5.5 (ref 5.0–7.5)

## 2023-03-01 MED ORDER — LORAZEPAM 0.5 MG PO TABS: ORAL_TABLET | ORAL | 2 refills | Status: DC

## 2023-03-01 NOTE — Progress Notes (Signed)
Assessment: 1. Frequent UTI   2. History of nephrolithiasis   3. Left low back pain, unspecified chronicity, unspecified whether sciatica present     Plan: No evidence of UTI today. I discussed possible causes of her left-sided back pain including nephrolithiasis. I reviewed the CT study from July 2024 which did not show any nephrolithiasis. Continue methods to reduce the risk of UTIs including increased fluid intake, timed and double voiding, daily cranberry supplement, daily probiotics, use of vaginal hormone cream. Continue vaginal hormone cream 2-3 times/week.  Continue fosfomycin 3 g every 10 days. Will schedule for CT renal stone protocol for evaluation of her back pain and history of nephrolithiasis.   Will contact her with results and to arrange follow-up.  Chief Complaint:  Chief Complaint  Patient presents with   Frequent UTI    History of Present Illness:  Connie Haynes is a 68 y.o. female who is seen for further evaluation of frequent UTI's and history of nephrolithiasis. She has been treated for frequent UTIs.  No typical UTI symptoms.  No dysuria or gross hematuria.  No fevers, chills, or flank pain.  She does occasionally notice some cloudy urine with a strong odor.  She was last treated for a UTI in May 2024.  Urine culture results: 6/21 50-100K E. Coli 6/22 <10K mixed flora 11/22 >100K Klebsiella 1/24 50-100K Enterobacter 3/24 <10K mixed flora 4/24 Mixed flora 5/24 10-49K Enterococcus 6/24 Mixed flora  She is unaware of the need to void.  She does not have a sensation of bladder fullness.  She voids on a schedule both daytime and nighttime.  She is not sure if she empties her bladder completely.  She has occasional episodes of incontinence without awareness.  These symptoms have been present since her diagnosis of transverse myelitis in 2021.  She has a history of kidney stones.  She required stent placement for a infected stone in 2015.  She reports passing  a stone in June 2024.  PVR = 21 ml. Resolve MDX urine culture from 08/04/2022 grew Enterococcus and Klebsiella.  She was treated with Augmentin x 7 days. CT abdomen and pelvis without contrast from 08/21/2022 showed no renal or ureteral calculi, no evidence of obstruction or other urologic abnormalities.  She noted some improvement in her voiding symptoms after completing the antibiotics.  She resumed her regular voiding symptoms with hesitancy and sensation of incomplete emptying.  She also noted a decreased stream.  She does not have a awareness of her need to void so she continues to void on a schedule.  No dysuria or gross hematuria.  No flank pain.  No fevers or chills. Urine culture from 09/01/2022 grew 50-100 K Enterobacter.  She was treated with Bactrim and started on daily Macrobid for UTI prevention. She discontinued the Macrobid due to nausea and vomiting. At her visit in August 2024, she was doing well without any new urinary symptoms.  No UTI symptoms. She continued on Premarin vaginal cream and was started on methenamine twice daily. At her visit in September 2024, she continued on methenamine and was doing well.  She had not had any recent UTI symptoms.  She had discontinued the methenamine for approximately 1 month due to GI symptoms with the medication. She developed flank pain in early November 2024 and was concerned about a possible UTI.  Resolve MDX urine culture grew Klebsiella.  She was treated with Levaquin x 7 days.  Her flank pain resolved.  No other symptoms. She  tried to resume the methenamine but continued to have GI symptoms.  She was treated for UTI symptoms with Levaquin in 12/24.  She was also started on fosfomycin every 10 days.   She reported some low back pain on the left which she thought was a possible UTI in early January 2025.  She was treated with 5 days of Levaquin for possible UTI.  She presents today for follow-up.  She has completed the Levaquin.  She is  currently on the fosfomycin every 10 days.  She continues to have fairly constant pain in the left lower back area with radiation into the left lower quadrant.  She reports passing a small kidney stone last week.  No dysuria or gross hematuria.  No fevers or chills.   Portions of the above documentation were copied from a prior visit for review purposes only.   Past Medical History:  Past Medical History:  Diagnosis Date   Adrenal insufficiency (HCC)    Anemia    denies   Anxiety    Basal cell carcinoma    eye lid   Chronic kidney disease, stage 3 (HCC)    Cough    hx. dry cough chronic   Disorder of tear duct system    frequent tearing of eyes   GERD (gastroesophageal reflux disease)    H/O: whooping cough 2000   History of kidney infection 2015   ? sepsis   History of kidney stones    History of kidney stones    Hyperlipidemia    Hypertension    IBS (irritable bowel syndrome)    controlled with med   Osteoarthritis    arthritis -joints-limited ROM shoulders more on right   PONV (postoperative nausea and vomiting)     per pt hx of low BP following shoulder surgery in 05-2015; was managed in hospital with IV fluids and d/c BP being taken a tthe time; pt was dced and F/U with endcrinoopgist who dx with adrenal insurffiency and started steroid therapy; treatmetnt with positive outome, now managed chronically on hydrocortisone tablets;   Sleep apnea    no longer using cpap   Stroke [facility])    per patient "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  Conni Slipper)   APPENDECTOMY     CARDIAC ELECTROPHYSIOLOGY STUDY AND ABLATION     '93 for WPW syndrome   CARPAL TUNNEL RELEASE Right 12/21/2014   Procedure: RIGHT CARPAL TUNNEL RELEASE ;  Surgeon: Beverely Low, MD;  Location: [facility] OR;  Service: Orthopedics;  Laterality: Right;   CARPAL TUNNEL RELEASE Left    CERVICAL SPINE SURGERY     cervical fusion with hardware retained   COLON SURGERY     "colon detached from abdomen"   COLONOSCOPY WITH PROPOFOL N/A 05/10/2014   Procedure: COLONOSCOPY WITH PROPOFOL;  Surgeon: Charna Elizabeth, MD;  Location: [facility];  Service: Endoscopy;  Laterality: N/A;   CYSTOSCOPY/RETROGRADE/URETEROSCOPY Left 12/18/2013   Procedure: CYSTOSCOPY/RETROGRADE/LEFT STENT;  Surgeon: Anner Crete, MD;  Location: [facility];  Service: Urology;  Laterality:  Left;   ESOPHAGOGASTRODUODENOSCOPY (EGD) WITH PROPOFOL N/A 05/10/2014   Procedure: ESOPHAGOGASTRODUODENOSCOPY (EGD) WITH PROPOFOL;  Surgeon: Charna Elizabeth, MD;  Location: [facility];  Service: Endoscopy;  Laterality: N/A;   EYE SURGERY     EYELID CARCINOMA EXCISION     INSERTION OF DIALYSIS CATHETER N/A 08/16/2019   Procedure: INSERTION OF DIALYSIS CATHETER;  Surgeon: Josephine Igo, DO;  Location: [facility];  Service: Pulmonary;  Laterality: N/A;   JOINT REPLACEMENT     KNEE ARTHROSCOPY Left 07/16/2016   REVERSE SHOULDER ARTHROPLASTY Right 06/29/2014   Procedure: REVERSE TOTAL SHOULDER ARTHROPLASTY;  Surgeon: Beverely Low, MD;  Location: [facility] OR;  Service: Orthopedics;  Laterality: Right;   REVISION TOTAL SHOULDER TO REVERSE TOTAL SHOULDER Left 05/15/2015   REVISION TOTAL SHOULDER TO REVERSE TOTAL SHOULDER Left 05/15/2015   Procedure: LEFT SHOULDER REVISION TOTAL SHOULDER ARTHRPLASTY TO REVERSE TOTAL SHOULDER ARTHROPLASTY ;  Surgeon: Beverely Low, MD;  Location: [facility] OR;  Service: Orthopedics;  Laterality: Left;   SPINE SURGERY     tear duct surgery     TOTAL ABDOMINAL HYSTERECTOMY     TOTAL HIP ARTHROPLASTY     x 3(rt x2, lt  x1)   TOTAL KNEE ARTHROPLASTY Right 08/08/2013   Procedure: RIGHT TOTAL KNEE ARTHROPLASTY WITH SAPHENOUS NERVE SCAR EXCISION;  Surgeon: Shelda Pal, MD;  Location: [facility];  Service: Orthopedics;  Laterality: Right;   TOTAL KNEE ARTHROPLASTY Left 08/25/2016   Procedure: LEFT TOTAL KNEE ARTHROPLASTY;  Surgeon: Durene Romans, MD;  Location: [facility];  Service: Orthopedics;  Laterality: Left;  70 mins; Adductor block   TOTAL SHOULDER ARTHROPLASTY Left 07/15/2012   Procedure: LEFT TOTAL SHOULDER ARTHROPLASTY;  Surgeon: Verlee Rossetti, MD;  [facility];  Service: Orthopedics;  Laterality: Left;   TOTAL SHOULDER REVISION Right 12/21/2014   Procedure: RIGHT SHOULDER POLY EXCHANGE ;  Surgeon: Beverely Low, MD;  Location[facility];  Service: Orthopedics;  Laterality: Right;   URETEROSCOPY  stent placement    Allergies:  Allergies  Allergen Reactions   Codeine Nausea And Vomiting    Can take if takes phernergan   Hydrocodone Nausea Only   Lamotrigine     "dizziness, shakiness and just feeling not right."   Macrobid [Nitrofurantoin] Diarrhea and Nausea And Vomiting   Robaxin [Methocarbamol] Rash    Family History:  Family History  Problem Relation Age of Onset   Leukemia Brother    Heart disease Mother    Hypertension Mother    Hyperlipidemia Mother    Heart attack Mother    Heart failure Mother    Heart disease Maternal Grandfather    Heart attack Maternal Grandfather    Cancer Paternal Grandfather    Lung cancer Paternal Grandfather    Cancer Maternal Uncle        bone    Social History:  Social History   Tobacco Use   Smoking status: Never   Smokeless tobacco: Never  Vaping Use   Vaping status: Never Used  Substance Use Topics   Alcohol use: No    Alcohol/week: 0.0 standard drinks of alcohol   Drug use: No    ROS: Constitutional:  Negative for fever, chills, weight loss CV: Negative for chest pain, previous MI, hypertension Respiratory:  Negative for shortness of  breath, wheezing, sleep apnea, frequent cough GI:  Negative for nausea, vomiting, bloody stool, GERD  Physical exam: BP 134/77   Pulse 61   Ht 5\' 5"  (1.651 m)   Wt 293 lb (132.9 kg)  BMI 48.76 kg/m  GENERAL APPEARANCE:  Well appearing, well developed, well nourished, NAD HEENT:  Atraumatic, normocephalic, oropharynx clear NECK:  Supple without lymphadenopathy or thyromegaly ABDOMEN:  Soft, non-tender, no masses EXTREMITIES:  Moves all extremities well, without clubbing, cyanosis, or edema NEUROLOGIC:  Alert and oriented x 3, normal gait, CN II-XII grossly intact MENTAL STATUS:  appropriate BACK:  Non-tender to palpation, No CVAT SKIN:  Warm, dry, and intact   Results: U/A: negative

## 2023-03-04 ENCOUNTER — Encounter: Payer: Self-pay | Admitting: Urology

## 2023-03-04 NOTE — Telephone Encounter (Signed)
Dr. Epimenio Foot- please deny this duplicate request, you already sent in on 03/01/23.

## 2023-03-05 ENCOUNTER — Ambulatory Visit (HOSPITAL_BASED_OUTPATIENT_CLINIC_OR_DEPARTMENT_OTHER): Payer: Medicare Other

## 2023-04-05 ENCOUNTER — Ambulatory Visit (INDEPENDENT_AMBULATORY_CARE_PROVIDER_SITE_OTHER): Payer: Medicare Other | Admitting: Neurology

## 2023-04-05 ENCOUNTER — Telehealth: Payer: Self-pay | Admitting: *Deleted

## 2023-04-05 ENCOUNTER — Encounter: Payer: Self-pay | Admitting: Neurology

## 2023-04-05 VITALS — BP 140/67 | HR 61 | Ht 65.0 in | Wt 293.0 lb

## 2023-04-05 DIAGNOSIS — R269 Unspecified abnormalities of gait and mobility: Secondary | ICD-10-CM

## 2023-04-05 DIAGNOSIS — R339 Retention of urine, unspecified: Secondary | ICD-10-CM | POA: Diagnosis not present

## 2023-04-05 DIAGNOSIS — D649 Anemia, unspecified: Secondary | ICD-10-CM

## 2023-04-05 DIAGNOSIS — G2581 Restless legs syndrome: Secondary | ICD-10-CM | POA: Diagnosis not present

## 2023-04-05 DIAGNOSIS — Z8669 Personal history of other diseases of the nervous system and sense organs: Secondary | ICD-10-CM

## 2023-04-05 DIAGNOSIS — Z79899 Other long term (current) drug therapy: Secondary | ICD-10-CM | POA: Diagnosis not present

## 2023-04-05 DIAGNOSIS — Z7961 Long term (current) use of immunomodulator: Secondary | ICD-10-CM

## 2023-04-05 DIAGNOSIS — G369 Acute disseminated demyelination, unspecified: Secondary | ICD-10-CM

## 2023-04-05 MED ORDER — LORAZEPAM 0.5 MG PO TABS: ORAL_TABLET | ORAL | 5 refills | Status: DC

## 2023-04-05 NOTE — Telephone Encounter (Signed)
 Called pt. Connie Haynes/intrafusion reports pt needs updated visit for insurance approval for Rituxan before infusion on 04/22/23. Cx 05/04/23 appt and r/s for today (04/05/23) at 1:30pm with Dr. Epimenio Foot. Pt verbalized understanding and will check in at 1:00pm.

## 2023-04-05 NOTE — Progress Notes (Signed)
 [facility]  PATIENT: Connie Haynes DOB: [birth year]  REFERRING DOCTOR OR PCP: Tresa Moore, MD SOURCE: Patient, notes from recent hospitalization, imaging and laboratory reports, MRI images personally reviewed.  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 Bilatreal numbness from knees down ongoing since episode in 2021. Left leg whole leg can go numb on occasion. Rls has been getting much worse as well worsens in the pm       HISTORY OF PRESENT ILLNESS:  Connie Haynes is a 68 y.o. 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  She also notes more left leg numbness and more RLS.   Pramipexole has not helped much. Tramadol has helped the legs some.    She prefer not to do gabapentin as poorly tolerated and prefers not to do Lyrica.    Lamotrigine caused dizziness.    She takes prn lorazepam but has not taken at bedtime.  Legs bother her less if she sits or is active but more if resting a long time.   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  She had been stable and tolerates the infusion well.    Last Rituxan dose is May 21, 2022 and we did 500 mg due to continued low IgG.   Due to itching, she is taking prednisone the day befpre the infusion now with benefit.     Around the time of the last visit there was concern of exacerbation and we checked imaging studies.  The transverse myelitis focus visible on MRIs.  The demyelinating focus in the brain is reduced in size.  She notes more numbness and sometimes left leg weakness that seems worse towards the end of each cycle.     She has had a few falls due to both balance and strength.    Sometimes trouble getting up from the chair.   She is unable to lift herself up if she is on the ground.      Her LETM and left ON occurred within a few days back in 2021.  Additionally, she had a demyelinating focus within the brain.  The vision change noticed shortly after presenting to the hospital for her leg/groin numbness,     She had a Covid vaccination 4 months earlier but nothing within a month.      She notes absent sensation below the hips with just a little bit of sensation in the thighs..Arms are not numb.   . She notes mild weakness in legs.   Due to the numbness, she needs to see her feet as she walks but she is able to walk without a cane.    She has dysesthesias on legs that can often be very painful.     She has urinary retention and hesitancy.  She has had a few UTI. Marland Kitchen   She will feel some pressure when the bladder is full but not every time.   Similar with bowel.   Scheduled urination and defecation may help some.    Vision is stable.  She has a left altitudinal visual field cut (confirmed on recent [facility] VF testing with ophth).  She feels that this is stable  She  is on steroid for adrenal insufficiency and had a recent crisis during an infection.      Repeat spine imaging MRIs 07/23/2020 compared them to the previous ones.  The MRIs look better showing no new lesions and fairly complete resolution of the enhancing focus that had extended from the mid cervical spine to the upper thoracic spine.  She has had adrenal insufficiency dx in 2018 and is on Cortef.    She has more neck pain since almost falling and catching self.   Orthopedics would like to do a myelogram (as she has ACDF artifact on MRI.   She is concerend due to h/o mild CRF (creatinine is 1.06 now)   LETM/NMOSD HISTORY In late June 2021, she had the onset of urinary retention and the next morning had numbness from the waist down.   She initially presented to the ED but left due to the long wait.   She re-presented  to [facility] emergency room when she had worsening with more intense numbness, weakness and reduced gait.  MRIs showed a longitudinal enhancing lesion in the lower cervical and upper thoracic spine (C4-T4) and the brain showed 2 foci, one that enhanced near the basal ganglia on the right.    She was admitted for further evaluation and treatment.  She had high dose steroids for 5 days but did not note a benefit.  She then had 5 treatments of plasma exchange..    Strength improved 08/22/2019 and she was able to take a few steps.   She was doing some physical therapy while in the hospital.  By the time of discharge, she was able to use a walker.  She improved further over the next month but continued to have numbness and reduced gait.      We received the results of the [facility] anti-NMO and anti-MOG test.  Both were negative.  MRIs showed a longitudinal enhancing lesion in the lower cervical and upper thoracic spine (C4-T4) and the brain showed 2 foci, one that enhanced near the basal ganglia on the right.  We had discussed that, despite that result, I am very concerned that she is at an elevated risk of a relapse.  Besides the inflammatory transverse myelitis, she also had an enhancing lesion in the brain.  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  Other tests including vasculitis labs were negative though CNS vasculitis would not always show up in blood work.  She has adrenal insufficiency and is on Cortef.  She also has stage 2 CKD, HTN, WPW (S/p ablation).  I personally reviewed the MRIs of the lumbar spine, thoracic spine, cervical spine and brain performed 08/10/2019 and 08/11/2019.  She had increased numbness in legs 07/2020.     Imaging review: MRI brain 08/11/2019: There is a 9 mm enhancing focus in the inferior right frontal lobe associated with some mass-effect.  Additionally, there is a small left T2/flair hyperintense focus just above the left internal capsule.  The etiology of the focus  lesion is uncertain.  It could represent a focus of demyelination.  Neoplasm cannot be completely ruled out.  The more chronic appearing focus could be demyelinating or ischemic  MRI of the cervical spine and thoracic spine 08/10/2019: Central to posterior enhancing focus within the spinal cord from C4-T4.  This causes some enlargement of the cord.  Additionally there is ACDF at C5-C7 and DJD at C3-C4 and C4-C5.  MRI of the lumbar spine 08/10/2019: There is anterolisthesis of  L3 upon L4 associated with facet hypertrophy and protrusion of the uncovered disc.  There is foraminal narrowing, more on the right.  Milder degenerative changes are noted at some of the lumbar levels but no spinal stenosis or significant nerve root compression.  MRI of the cervical and thoracic spine 01/10/2020 showed greatly improved appearance of the abnormal signal.  On this MRI, there is just minimal T2 hyperintensity adjacent to C6-C7 and mild abnormal enhancement posteriorly adjacent to C7.  MRI of the brain 01/10/2020 showed interval improvement of the enhancing focus in the inferior right frontal lobe that have been seen on the 08/11/2019 MRI.  There was still a small focus of enhancement in that region.  Several other T2/FLAIR hyperintense foci in the hemispheres are more consistent with minimal stable chronic microvascular ischemic change.  MRI of the cervical and thoracic spine 07/23/2020 showed complete resolution of the enhancing focus from C4-T4 that has been seen on previous MRIs.   However, there appears to be mild atrophy at C7-T1.    MRI of the brain 10/05/2021 showed a right inferior frontal/medial temporal focus that is hyperintense on T2 weighted and FLAIR images consistent with remote demyelination.  In 2021, this focus was larger, associated with edema and enhanced.  It no longer is associated with edema or enhances.   Remote lacunar infarction adjacent to the basal ganglia on the left, unchanged compared to the  previous MRI.  Few scattered T2/FLAIR hyperintense foci in the hemispheres consistent with minimal age appropriate chronic microvascular ischemic changes.  MRI of the cervical and thoracic spine 822/2023 showed resolution of the C4-T4 lesion noted on the previous MRIs.  Laboratory review While in the hospital 08/2019, she had a LP (CSF was normal except slightly elevated protein); NMO Ab (negative), anti-MOG (negative).  Additionally she had ANCA, HIV, TB, B12, rheumatoid factor, anti-Jo 1, ENA, HSV, ACE.  These were all negative or normal.    July 2021:  Hep B cAb, Hep B sAg, Hep B sAb all negative.    ANA was negative  Repeat [facility] anti-Mog and anti-NMO were negative 09/2019   REVIEW OF SYSTEMS: Constitutional: No fevers, chills, sweats, or change in appetite Eyes: No visual changes, double vision, eye pain Ear, nose and throat: No hearing loss, ear pain, nasal congestion, sore throat Cardiovascular: No chest pain, palpitations Respiratory:  No shortness of breath at rest or with exertion.   No wheezes GastrointestinaI: No nausea, vomiting, diarrhea, abdominal pain, fecal incontinence Genitourinary:  No dysuria, urinary retention or frequency.  No nocturia. Musculoskeletal:  No neck pain, back pain Integumentary: No rash, pruritus, skin lesions Neurological: as above Psychiatric: No depression at this time.  No anxiety Endocrine: No palpitations, diaphoresis, change in appetite, change in weigh or increased thirst Hematologic/Lymphatic:  No anemia, purpura, petechiae. Allergic/Immunologic: No itchy/runny eyes, nasal congestion, recent allergic reactions, rashes  ALLERGIES: Allergies  Allergen Reactions   Codeine Nausea And Vomiting    Can take if takes phernergan   Hydrocodone Nausea Only   Lamotrigine     "dizziness, shakiness and just feeling not right."   Macrobid [Nitrofurantoin] Diarrhea and Nausea And Vomiting   Robaxin [Methocarbamol] Rash    HOME MEDICATIONS:  Current  Outpatient Medications:    carvedilol (COREG) 25 MG tablet, Take 1 tablet (25 mg total) by mouth 2 (two) times daily., Disp: 180 tablet, Rfl: 3   citalopram (CELEXA) 40 MG tablet, Take 1 tablet (40 mg total) by mouth daily., Disp: 90 tablet, Rfl: 3  fosfomycin (MONUROL) 3 g PACK, Take 3 g by mouth every 10 days, Disp: 9 g, Rfl: 3   furosemide (LASIX) 40 MG tablet, TAKE 2 TABLETS BY MOUTH EVERY OTHER DAY, Disp: 90 tablet, Rfl: 3   hydrocortisone (CORTEF) 10 MG tablet, Take 5-10 mg by mouth See admin instructions. Take 10mg  in the AM and 5mg  in the afternoon., Disp: , Rfl:    LINZESS 72 MCG capsule, Take 1 capsule (72 mcg total) by mouth daily., Disp: 90 capsule, Rfl: 3   LORazepam (ATIVAN) 0.5 MG tablet, TAKE 1 TABLET BY MOUTH ONCE DAILY AS NEEDED, Disp: 30 tablet, Rfl: 2   Multiple Vitamin (MULTIVITAMIN ADULT PO), Take 1 tablet by mouth daily., Disp: , Rfl:    ondansetron (ZOFRAN) 4 MG tablet, Take 4 mg by mouth every 8 (eight) hours as needed., Disp: , Rfl:    potassium chloride (KLOR-CON) 10 MEQ tablet, Take 10 mEq by mouth as needed. WITH LASIX, Disp: , Rfl:    pramipexole (MIRAPEX) 1 MG tablet, TAKE 1 PO BID, Disp: 180 tablet, Rfl: 3   predniSONE (DELTASONE) 20 MG tablet, Take 3 pills po the day before infusions., Disp: 12 tablet, Rfl: 0   riTUXimab (RITUXAN IV), Inject into the vein., Disp: , Rfl:    telmisartan-hydrochlorothiazide (MICARDIS HCT) 80-12.5 MG tablet, Take 1 tablet by mouth daily., Disp: 90 tablet, Rfl: 3   tiZANidine (ZANAFLEX) 4 MG tablet, TAKE 1 TO 1 & 1/2 (ONE & ONE-HALF) TABLETS BY MOUTH THREE TIMES DAILY AS NEEDED, Disp: 270 tablet, Rfl: 2   traMADol (ULTRAM) 50 MG tablet, Take 1 tablet (50 mg total) by mouth every 8 (eight) hours as needed., Disp: 90 tablet, Rfl: 2 No current facility-administered medications for this visit.  Facility-Administered Medications Ordered in Other Visits:    chlorhexidine (HIBICLENS) 4 % liquid 4 application, 60 mL, Topical, Once, Dixon,  Katharina Caper, PA-C   technetium tetrofosmin (TC-MYOVIEW) injection 31.7 milli Curie, 31.7 millicurie, Intravenous, Once PRN, Lars Masson, MD  PAST MEDICAL HISTORY: Past Medical History:  Diagnosis Date   Adrenal insufficiency (HCC)    Anemia    denies   Anxiety    Basal cell carcinoma    eye lid   Chronic kidney disease, stage 3 (HCC)    Cough    hx. dry cough chronic   Disorder of tear duct system    frequent tearing of eyes   GERD (gastroesophageal reflux disease)    H/O: whooping cough 2000   History of kidney infection 2015   ? sepsis   History of kidney stones    History of kidney stones    Hyperlipidemia    Hypertension    IBS (irritable bowel syndrome)    controlled with med   Osteoarthritis    arthritis -joints-limited ROM shoulders more on right   PONV (postoperative nausea and vomiting)     per pt hx of low BP following shoulder surgery in 05-2015; was managed in hospital with IV fluids and d/c BP being taken a tthe time; pt was dced and F/U with endcrinoopgist who dx with adrenal insurffiency and started steroid therapy; treatmetnt with positive outome, now managed chronically on hydrocortisone tablets;   Sleep apnea    no longer using cpap   Stroke [facility])    per patient "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 Conni Slipper)   APPENDECTOMY     CARDIAC ELECTROPHYSIOLOGY STUDY AND ABLATION     '93 for WPW syndrome   CARPAL TUNNEL RELEASE Right 12/21/2014   Procedure: RIGHT CARPAL TUNNEL RELEASE ;  Surgeon: Beverely Low, MD;   Location: [facility] OR;  Service: Orthopedics;  Laterality: Right;   CARPAL TUNNEL RELEASE Left    CERVICAL SPINE SURGERY     cervical fusion with hardware retained   COLON SURGERY     "colon detached from abdomen"   COLONOSCOPY WITH PROPOFOL N/A 05/10/2014   Procedure: COLONOSCOPY WITH PROPOFOL;  Surgeon: Charna Elizabeth, MD;  Location: [facility];  Service: Endoscopy;  Laterality: N/A;   CYSTOSCOPY/RETROGRADE/URETEROSCOPY Left 12/18/2013   Procedure: CYSTOSCOPY/RETROGRADE/LEFT STENT;  Surgeon: Anner Crete, MD;  Location: [facility];  Service: Urology;  Laterality: Left;   ESOPHAGOGASTRODUODENOSCOPY (EGD) WITH PROPOFOL N/A 05/10/2014   Procedure: ESOPHAGOGASTRODUODENOSCOPY (EGD) WITH PROPOFOL;  Surgeon: Charna Elizabeth, MD;  Location: [facility];  Service: Endoscopy;  Laterality: N/A;   EYE SURGERY     EYELID CARCINOMA EXCISION     INSERTION OF DIALYSIS CATHETER N/A 08/16/2019   Procedure: INSERTION OF DIALYSIS CATHETER;  Surgeon: Josephine Igo, DO;  Location: [facility];  Service: Pulmonary;  Laterality: N/A;   JOINT REPLACEMENT     KNEE ARTHROSCOPY Left 07/16/2016   REVERSE SHOULDER ARTHROPLASTY Right 06/29/2014   Procedure: REVERSE TOTAL SHOULDER ARTHROPLASTY;  Surgeon: Beverely Low, MD;  [facility];  Service: Orthopedics;  Laterality: Right;   REVISION TOTAL SHOULDER TO REVERSE TOTAL SHOULDER Left 05/15/2015   REVISION TOTAL SHOULDER TO REVERSE TOTAL SHOULDER Left 05/15/2015   Procedure: LEFT SHOULDER REVISION TOTAL SHOULDER ARTHRPLASTY TO REVERSE TOTAL SHOULDER ARTHROPLASTY ;  Surgeon: Beverely Low, MD;  Location: [facility] OR;  Service: Orthopedics;  Laterality: Left;   SPINE SURGERY     tear duct surgery     TOTAL ABDOMINAL HYSTERECTOMY     TOTAL HIP ARTHROPLASTY     x 3(rt x2, lt x1)   TOTAL KNEE ARTHROPLASTY Right 08/08/2013   Procedure: RIGHT TOTAL KNEE ARTHROPLASTY WITH SAPHENOUS NERVE SCAR EXCISION;  Surgeon: Shelda Pal, MD;  Location: [facility];  Service: Orthopedics;  Laterality:  Right;   TOTAL KNEE ARTHROPLASTY Left 08/25/2016   Procedure: LEFT TOTAL KNEE ARTHROPLASTY;  Surgeon: Durene Romans, MD;  Location: [facility];  Service: Orthopedics;  Laterality: Left;  70 mins; Adductor block   TOTAL SHOULDER ARTHROPLASTY Left 07/15/2012   Procedure: LEFT TOTAL SHOULDER ARTHROPLASTY;  Surgeon: Verlee Rossetti, MD;  Location: [facility] OR;  Service: Orthopedics;  Laterality: Left;   TOTAL SHOULDER REVISION Right 12/21/2014   Procedure: RIGHT SHOULDER POLY EXCHANGE ;  Surgeon: Beverely Low, MD;  Location: [facility] OR;  Service: Orthopedics;  Laterality: Right;   URETEROSCOPY  stent placement    FAMILY HISTORY: Family History  Problem Relation Age of Onset   Leukemia Brother    Heart disease Mother    Hypertension Mother    Hyperlipidemia Mother    Heart attack Mother    Heart failure Mother    Heart disease Maternal Grandfather    Heart attack Maternal Grandfather    Cancer Paternal Grandfather    Lung cancer Paternal  Grandfather    Cancer Maternal Uncle        bone    SOCIAL HISTORY:  Social History   Socioeconomic History   Marital status: Married    Spouse name: Sport and exercise psychologist   Number of children: 0   Years of education: Not on file   Highest education level: Not on file  Occupational History   Occupation: Retired     Associate Professor: [employer]  Tobacco Use   Smoking status: Never   Smokeless tobacco: Never  Vaping Use   Vaping status: Never Used  Substance and Sexual Activity   Alcohol use: No    Alcohol/week: 0.0 standard drinks of alcohol   Drug use: No   Sexual activity: Not Currently    Partners: Male  Other Topics Concern   Not on file  Social History Narrative   Marital Status:  Married Armed forces operational officer)   Living Situation: Lives with spouse   Occupation: Psychologist, 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   Tobacco Use/Exposure:  None   Alcohol Use: None   Drug Use:  None   Diet:  Low Fat/Low Carb   Exercise:  3-4 Days per week   Hobbies:  Reading,  Travel   Left handed    Caffeine: 1 cup coffee every morning, diet coke (rare)   Social Drivers of Corporate investment banker Strain: Low Risk  (09/01/2022)   Overall Financial Resource Strain (CARDIA)    Difficulty of Paying Living Expenses: Not very hard  Food Insecurity: No Food Insecurity (09/01/2022)   Hunger Vital Sign    Worried About Running Out of Food in the Last Year: Never true    Ran Out of Food in the Last Year: Never true  Transportation Needs: No Transportation Needs (09/01/2022)   PRAPARE - Administrator, Civil Service (Medical): No    Lack of Transportation (Non-Medical): No  Physical Activity: Insufficiently Active (09/01/2022)   Exercise Vital Sign    Days of Exercise per Week: 5 days    Minutes of Exercise per Session: 20 min  Stress: Stress Concern Present (09/01/2022)   [employer] of Occupational Health - Occupational Stress Questionnaire    Feeling of Stress : To some extent  Social Connections: Moderately Isolated (09/01/2022)   Social Connection and Isolation Panel [NHANES]    Frequency of Communication with Friends and Family: Twice a week    Frequency of Social Gatherings with Friends and Family: Twice a week    Attends Religious Services: Never    Database administrator or Organizations: No    Attends Banker Meetings: Never    Marital Status: Married  Catering manager Violence: Not At Risk (09/02/2022)   Humiliation, Afraid, Rape, and Kick questionnaire    Fear of Current or Ex-Partner: No    Emotionally Abused: No    Physically Abused: No    Sexually Abused: No     PHYSICAL EXAM  Vitals:   04/05/23 1323  BP: (!) 140/67  Pulse: 61  Weight: 292 lb 15.9 oz (132.9 kg)  Height: 5\' 5"  (1.651 m)    Body mass index is 48.76 kg/m.   General: The patient is well-developed and well-nourished and in no acute distress  HEENT:  Head is [place]/AT.  Sclera are anicteric.   Neck: No carotid bruits are noted.  The neck is  tender in lower cervical paraspinal muscles  Skin: Extremities are without rash or edema.  Neurologic Exam  Mental  status: The patient is alert and oriented x 3 at the time of the examination. The patient has apparent normal recent and remote memory, with an apparently normal attention span and concentration ability.   Speech is normal.  Cranial nerves: Extraocular movements are full.  Color vision is reduced OS, VA is reduced OS but she can still read large print  Facial symmetry is present. There is good facial sensation to soft touch bilaterally.Facial strength is normal.  Trapezius and sternocleidomastoid strength is normal. No dysarthria is noted.   No obvious hearing deficits are noted.  Motor:  Muscle bulk is normal.   Tone is normal.  Strength is 4+/5 deltoids, 5 biceps and 4+/5 left tricep, 3 to 4 -/5 in proximal legs, 4/5 with quadriceps and 4 -/5 in the feet and ankles  Sensory: She has absent vibration sensation in the legs and absent touch/ temperaturein in left leg and reduced in right leg (notes sensation more in the thighs than lower leg) and lower flanks to lower chest, left worse than right in trunk.   Slight reduced vibration sensation in 5th fingers relative to 2nd fingers.    Coordination: Cerebellar testing reveals good finger-nose-finger.  She has a poor heel-to-shin.    Gait and station: She is unable to stand up from the chair without using her arms for support.  She is walking without a cane or walker but is ataxic.  She cannot do a tandem walk.  The Romberg is borderline.  Reflexes: Deep tendon reflexes are symmetric and normal in arms, cross adductors at the knees.  No clonus.Marland Kitchen        DIAGNOSTIC DATA (LABS, IMAGING, TESTING) - I reviewed patient records, labs, notes, testing and imaging myself where available.  Lab Results  Component Value Date   WBC 7.5 06/04/2022   HGB 13.2 06/04/2022   HCT 39.2 06/04/2022   MCV 91.7 06/04/2022   PLT 228.0 06/04/2022       Component Value Date/Time   NA 139 12/04/2022 0908   K 3.9 12/04/2022 0908   CL 101 12/04/2022 0908   CO2 23 12/04/2022 0908   GLUCOSE 98 12/04/2022 0908   GLUCOSE 105 (H) 06/04/2022 1043   BUN 16 12/04/2022 0908   CREATININE 0.92 12/04/2022 0908   CREATININE 0.87 06/18/2015 1207   CALCIUM 9.4 12/04/2022 0908   PROT 6.4 06/04/2022 1043   ALBUMIN 4.3 06/04/2022 1043   ALBUMIN 4.0 08/10/2019 2356   AST 12 06/04/2022 1043   ALT 10 06/04/2022 1043   ALKPHOS 82 06/04/2022 1043   BILITOT 0.5 06/04/2022 1043   GFRNONAA >60 08/24/2019 0417   GFRAA >60 08/24/2019 0417   Lab Results  Component Value Date   CHOL 79 08/17/2019   HDL 22 (L) 08/17/2019   LDLCALC 29 08/17/2019   TRIG 141 08/17/2019   CHOLHDL 3.6 08/17/2019   No results found for: "HGBA1C" Lab Results  Component Value Date   VITAMINB12 376 08/11/2019   Lab Results  Component Value Date   TSH 3.119 08/11/2019       ASSESSMENT AND PLAN  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HCC)  Gait disturbance  History of optic neuritis  Long-term current use of immunomodulator  High risk medication use  Urinary retention    1.   She will continue Rituxan at 500 mg q6 months.  We lowered the dose due to reduced Igg/IgM.    Recheck IgG and IgM.  If these levels are dropping further consider changing the Rituxan to every 8 months.  With age, B-cell reconstitution is usually slower. 2.   For RLS, check ferritin and iron.   Add lorazepam at bedtime and continue pramipexole (can not tolerate gabapentin).   3.   Consider Vimpat for her dysesthesias.  She could not tolerate gabapentin or lamotrigine.  Tricyclic's or duloxetine might make the restless legs worse 4.   Stay active and exercise as tolerated.   .   5.   Return in 5-6 months or sooner if there are new or worsening neurologic symptoms.     This visit is part of a comprehensive longitudinal care medical relationship regarding the patients primary diagnosis of neuromyelitis optica and  related concerns.  Landon Truax A. Epimenio Foot, MD, [facility] 04/05/2023, 1:58 PM Certified in Neurology, Clinical Neurophysiology, Sleep Medicine and Neuroimaging  [facility] [address], [address] [place], [place] [address] [phone]

## 2023-04-06 ENCOUNTER — Encounter: Payer: Self-pay | Admitting: Neurology

## 2023-04-06 LAB — CBC WITH DIFFERENTIAL/PLATELET
Basophils Absolute: 0.1 10*3/uL (ref 0.0–0.2)
Basos: 1 %
EOS (ABSOLUTE): 0.3 10*3/uL (ref 0.0–0.4)
Eos: 4 %
Hematocrit: 38.1 % (ref 34.0–46.6)
Hemoglobin: 12.4 g/dL (ref 11.1–15.9)
Immature Grans (Abs): 0 10*3/uL (ref 0.0–0.1)
Immature Granulocytes: 0 %
Lymphocytes Absolute: 1.4 10*3/uL (ref 0.7–3.1)
Lymphs: 17 %
MCH: 29.9 pg (ref 26.6–33.0)
MCHC: 32.5 g/dL (ref 31.5–35.7)
MCV: 92 fL (ref 79–97)
Monocytes Absolute: 0.5 10*3/uL (ref 0.1–0.9)
Monocytes: 6 %
Neutrophils Absolute: 5.8 10*3/uL (ref 1.4–7.0)
Neutrophils: 72 %
Platelets: 252 10*3/uL (ref 150–450)
RBC: 4.15 x10E6/uL (ref 3.77–5.28)
RDW: 12.7 % (ref 11.7–15.4)
WBC: 8.1 10*3/uL (ref 3.4–10.8)

## 2023-04-06 LAB — IRON,TIBC AND FERRITIN PANEL
Ferritin: 62 ng/mL (ref 15–150)
Iron Saturation: 15 % (ref 15–55)
Iron: 58 ug/dL (ref 27–139)
Total Iron Binding Capacity: 378 ug/dL (ref 250–450)
UIBC: 320 ug/dL (ref 118–369)

## 2023-04-06 LAB — IGG, IGA, IGM
IgA/Immunoglobulin A, Serum: 62 mg/dL — ABNORMAL LOW (ref 87–352)
IgG (Immunoglobin G), Serum: 426 mg/dL — ABNORMAL LOW (ref 586–1602)
IgM (Immunoglobulin M), Srm: 8 mg/dL — ABNORMAL LOW (ref 26–217)

## 2023-04-09 ENCOUNTER — Ambulatory Visit: Payer: Medicare Other | Attending: Internal Medicine | Admitting: Internal Medicine

## 2023-04-09 ENCOUNTER — Encounter: Payer: Self-pay | Admitting: Internal Medicine

## 2023-04-09 VITALS — BP 112/74 | HR 58 | Ht 65.0 in | Wt 292.0 lb

## 2023-04-09 DIAGNOSIS — I456 Pre-excitation syndrome: Secondary | ICD-10-CM | POA: Diagnosis not present

## 2023-04-09 DIAGNOSIS — I493 Ventricular premature depolarization: Secondary | ICD-10-CM

## 2023-04-09 MED ORDER — FLECAINIDE ACETATE 50 MG PO TABS: 75.0000 mg | ORAL_TABLET | Freq: Two times a day (BID) | ORAL | 1 refills | Status: DC

## 2023-04-09 NOTE — Progress Notes (Signed)
 Patient Care Team: Olive Bass, FNP as PCP - General (Internal Medicine) Duke Salvia, MD as PCP - Electrophysiology (Cardiology) Sater, Pearletha Furl, MD (Neurology)   HPI  Connie Haynes is a 68 y.o. female seen seen in follow-up for palpitations and history of asymmetric lower extremity edema.  Remote history of catheter ablation for WPW (1993)  Event recorder undertaken 9/22 notable for palpitations associated with PACs including some that were blocked and associated with the pause and a junctional escape  Past history notable for Addison's disease as well as transverse myelitis for which she has been managed with immunosuppression Saw RU-PA with complaints of more palpitations 12/24.  Event recorder was reviewed.  Most symptoms of racing and or shortness of breath was associated with sinus rhythm occasionally with atrial ectopy.  There was some irregularity with a atrial tachycardia.  No reentrant SVT was noted.  She describes racing heartbeats occurring 1-10 times a week with documentation of her phone of 160-180 consistent with event recorder.  What is particularly interesting is that she reports her blood pressure almost always couple of hours after these events falling into the 80s and 70s.  She has understood this from her endocrinologist to be an acute mini adrenal crisis and in response takes extra Solu-Cortef.  These events can also occur in other situations a low-grade infections but are invariably associated with her tachycardia in the same timeframe as noted With her transverse myelitis, her walking is very limited.  No chest pain.  Chronic dyspnea.  Intermittent edema for which she takes diuretics  DATE TEST EF   8/15 Myoview     % No ischemia  10/22 Echo   60-65 %   11/24 Echo   60-65%         Date Cr K Hgb  9/22 0.95 3.8 12.3   10/24 0.92 3.9 12.4   DATE PR interval QRSduration PQRS Dose-flecainide  2/25  138 96 234 0   Records and Results Reviewed    Past Medical History:  Diagnosis Date   Adrenal insufficiency (HCC)    Anemia    denies   Anxiety    Basal cell carcinoma    eye lid   Chronic kidney disease, stage 3 (HCC)    Cough    hx. dry cough chronic   Disorder of tear duct system    frequent tearing of eyes   GERD (gastroesophageal reflux disease)    H/O: whooping cough 2000   History of kidney infection 2015   ? sepsis   History of kidney stones    History of kidney stones    Hyperlipidemia    Hypertension    IBS (irritable bowel syndrome)    controlled with med   Osteoarthritis    arthritis -joints-limited ROM shoulders more on right   PONV (postoperative nausea and vomiting)     per pt hx of low BP following shoulder surgery in 05-2015; was managed in hospital with IV fluids and d/c BP being taken a tthe time; pt was dced and F/U with endcrinoopgist who dx with adrenal insurffiency and started steroid therapy; treatmetnt with positive outome, now managed chronically on hydrocortisone tablets;   Sleep apnea    no longer using cpap   Stroke [facility])    per patient "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 Conni Slipper)   APPENDECTOMY     CARDIAC ELECTROPHYSIOLOGY STUDY AND ABLATION     '93 for WPW syndrome   CARPAL TUNNEL RELEASE Right 12/21/2014   Procedure: RIGHT CARPAL TUNNEL RELEASE ;  Surgeon: Beverely Low, MD;  Location: [facility] OR;  Service: Orthopedics;  Laterality: Right;   CARPAL TUNNEL RELEASE Left    CERVICAL SPINE SURGERY     cervical fusion with hardware retained   COLON SURGERY     "colon detached from  abdomen"   COLONOSCOPY WITH PROPOFOL N/A 05/10/2014   Procedure: COLONOSCOPY WITH PROPOFOL;  Surgeon: Charna Elizabeth, MD;  Location: [facility] ENDOSCOPY;  Service: Endoscopy;  Laterality: N/A;   CYSTOSCOPY/RETROGRADE/URETEROSCOPY Left 12/18/2013   Procedure: CYSTOSCOPY/RETROGRADE/LEFT STENT;  Surgeon: Anner Crete, MD;  Location: [facility] ORS;  Service: Urology;  Laterality: Left;   ESOPHAGOGASTRODUODENOSCOPY (EGD) WITH PROPOFOL N/A 05/10/2014   Procedure: ESOPHAGOGASTRODUODENOSCOPY (EGD) WITH PROPOFOL;  Surgeon: Charna Elizabeth, MD;  Location: [facility] ENDOS[facility];  Service: Endoscopy;  Laterality: N/A;   EYE SURGERY     EYELID CARCINOMA EXCISION     INSERTION OF DIALYSIS CATHETER N/A 08/16/2019   Procedure: INSERTION OF DIALYSIS CATHETER;  Surgeon: Josephine Igo, DO;  Location: [facility] ENDOSCOPY;  Service: Pulmonary;  Laterality: N/A;   JOINT REPLACEMENT     KNEE ARTHROSCOPY Left 07/16/2016   REVERSE SHOULDER ARTHROPLASTY Right 06/29/2014   Procedure: REVERSE TOTAL SHOULDER ARTHROPLASTY;  Surgeon: Beverely Low, MD;  Location: [facility] OR;  Service: Orthopedics;  Laterality: Right;   REVISION TOTAL SHOULDER TO REVERSE TOTAL SHOULDER Left 05/15/2015   REVISION TOTAL SHOULDER TO REVERSE TOTAL SHOULDER Left 05/15/2015   Procedure: LEFT SHOULDER REVISION TOTAL SHOULDER ARTHRPLASTY TO REVERSE TOTAL SHOULDER ARTHROPLASTY ;  Surgeon: Beverely Low, MD;  Location: [facility] OR;  Service: Orthopedics;  Laterality: Left;   SPINE SURGERY     tear duct surgery     TOTAL ABDOMINAL HYSTERECTOMY     TOTAL HIP ARTHROPLASTY     x 3(rt x2, lt x1)   TOTAL KNEE ARTHROPLASTY Right 08/08/2013   Procedure: RIGHT TOTAL KNEE ARTHROPLASTY WITH SAPHENOUS NERVE SCAR EXCISION;  Surgeon: Shelda Pal, MD;  Location: [facility] ORS;  Service: Orthopedics;  Laterality: Right;   TOTAL KNEE ARTHROPLASTY Left 08/25/2016   Procedure: LEFT TOTAL KNEE ARTHROPLASTY;  Surgeon: Durene Romans, MD;  Location: [facility] ORS;  Service: Orthopedics;  Laterality: Left;  70 mins; Adductor  block   TOTAL SHOULDER ARTHROPLASTY Left 07/15/2012   Procedure: LEFT TOTAL SHOULDER ARTHROPLASTY;  Surgeon: Verlee Rossetti, MD;  Location: [facility] OR;  Service: Orthopedics;  Laterality: Left;   TOTAL SHOULDER REVISION Right 12/21/2014   Procedure: RIGHT SHOULDER POLY EXCHANGE ;  Surgeon: Beverely Low, MD;  Location: [facility] OR;  Service: Orthopedics;  Laterality: Right;   URETEROSCOPY  stent placement    Current Meds  Medication Sig   carvedilol (COREG) 25 MG tablet Take 1 tablet (25 mg total) by mouth 2 (two) times daily.   citalopram (CELEXA) 40 MG tablet Take 1 tablet (40 mg total) by mouth daily.   fosfomycin (MONUROL) 3 g PACK Take 3 g by mouth every 10 days   furosemide (LASIX) 40 MG tablet TAKE 2 TABLETS BY MOUTH EVERY  OTHER DAY   hydrocortisone (CORTEF) 10 MG tablet Take 5-10 mg by mouth See admin instructions. Take 10mg  in the AM and 5mg  in the afternoon.   LINZESS 72 MCG capsule Take 1 capsule (72 mcg total) by mouth daily.   LORazepam (ATIVAN) 0.5 MG tablet TAKE 1 TABLET BY MOUTH TWICE DAILY AS NEEDED   Multiple Vitamin (MULTIVITAMIN ADULT PO) Take 1 tablet by mouth daily.   ondansetron (ZOFRAN) 4 MG tablet Take 4 mg by mouth every 8 (eight) hours as needed.   potassium chloride (KLOR-CON) 10 MEQ tablet Take 10 mEq by mouth as needed. WITH LASIX   pramipexole (MIRAPEX) 1 MG tablet TAKE 1 PO BID   predniSONE (DELTASONE) 20 MG tablet Take 3 pills po the day before infusions.   riTUXimab (RITUXAN IV) Inject into the vein.   telmisartan-hydrochlorothiazide (MICARDIS HCT) 80-12.5 MG tablet Take 1 tablet by mouth daily.   tiZANidine (ZANAFLEX) 4 MG tablet TAKE 1 TO 1 & 1/2 (ONE & ONE-HALF) TABLETS BY MOUTH THREE TIMES DAILY AS NEEDED   traMADol (ULTRAM) 50 MG tablet Take 1 tablet (50 mg total) by mouth every 8 (eight) hours as needed.    Allergies  Allergen Reactions   Codeine Nausea And Vomiting    Can take if takes phernergan   Hydrocodone Nausea Only   Lamotrigine     "dizziness,  shakiness and just feeling not right."   Macrobid [Nitrofurantoin] Diarrhea and Nausea And Vomiting   Robaxin [Methocarbamol] Rash      Review of Systems negative except from HPI and PMH  Physical Exam  BP 112/74   Pulse (!) 58   Ht 5\' 5"  (1.651 m)   Wt 292 lb (132.5 kg)   SpO2 97%   BMI 48.59 kg/m  Well developed and Morbidly obese in no acute distress HENT normal Neck supple  Clear Regular rate and rhythm, no  gallop No  murmur Abd-soft with active BS No Clubbing cyanosis  edema Skin-warm and dry A & Oriented  Grossly normal sensory and motor function  ECG sinus at 58 Intervals 13/10/42   CrCl cannot be calculated (Patient's most recent lab result is older than the maximum 21 days allowed.).   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  These were associated with postevent hypotension which she describes as many adrenal crises per her endocrinologist.  This is variable association is perplexing to me.  However, if we could avoid this, by suppressing her tachycardia, this would be a good thing as it might reduce the risk of falls given the blood pressures in the 70s and 80s.  Interestingly, tachycardia can trigger vasomotor issues but these would expect the occurring within 30 minutes of the event not 3 or 4 hours later.  The beta-blockers are demonstrably insufficient in controlling her tachypalpitations.  Blood pressure is well-controlled.  Will continue her on telmisartan hydrochlorothiazide  She might be a candidate for an SGLT2 but I would worry about it in the context of her steroids so we will avoid.   Current medicines are reviewed at length with the patient today .  The patient does  not   have concerns regarding medicines.

## 2023-04-09 NOTE — Patient Instructions (Signed)
 Medication Instructions:  Your physician has recommended you make the following change in your medication:   ** Begin Flecainide 50mg  - 1.5 tablets by mouth twice daily  *If you need a refill on your cardiac medications before your next appointment, please call your pharmacy*   Lab Work: None ordered.  If you have labs (blood work) drawn today and your tests are completely normal, you will receive your results only by: MyChart Message (if you have MyChart) OR A paper copy in the mail If you have any lab test that is abnormal or we need to change your treatment, we will call you to review the results.   Testing/Procedures: None ordered.    Follow-Up: At [facility], you and your health needs are our priority.  As part of our continuing mission to provide you with exceptional heart care, we have created designated Provider Care Teams.  These Care Teams include your primary Cardiologist (physician) and Advanced Practice Providers (APPs -  Physician Assistants and Nurse Practitioners) who all work together to provide you with the care you need, when you need it.  We recommend signing up for the patient portal called "MyChart".  Sign up information is provided on this After Visit Summary.  MyChart is used to connect with patients for Virtual Visits (Telemedicine).  Patients are able to view lab/test results, encounter notes, upcoming appointments, etc.  Non-urgent messages can be sent to your provider as well.   To learn more about what you can do with MyChart, go to [email or web address].    Your next appointment:   3 months with Francis Dowse, PA-C  Other Instructions Pt will need a nurse visit in 2 weeks for ECG due to Flecainide start

## 2023-04-11 ENCOUNTER — Encounter: Payer: Self-pay | Admitting: Internal Medicine

## 2023-04-20 ENCOUNTER — Ambulatory Visit: Payer: Medicare Other

## 2023-04-21 ENCOUNTER — Other Ambulatory Visit: Payer: Self-pay | Admitting: Neurology

## 2023-04-21 NOTE — Telephone Encounter (Signed)
 Last seen 04/05/23, next appt 10/21/23 Dispenses   Dispensed Days Supply Quantity Provider Pharmacy  TRAMADOL 50MG  TAB 12/15/2022 30 90 each Sater, Pearletha Furl, MD [facility]  TRAMADOL 50MG  TAB 11/18/2022 30 90 each Sater, Pearletha Furl, MD [facility]  TRAMADOL 50MG  TAB 05/18/2022 30 90 each Sater, Pearletha Furl, MD [facility]Marland KitchenMarland Kitchen

## 2023-04-22 DIAGNOSIS — G360 Neuromyelitis optica [Devic]: Secondary | ICD-10-CM | POA: Diagnosis not present

## 2023-04-22 DIAGNOSIS — G35 Multiple sclerosis: Secondary | ICD-10-CM | POA: Diagnosis not present

## 2023-04-30 NOTE — Telephone Encounter (Signed)
 Called and spoke to patient We again explored the question as to whether her palpitations ( both racing and irregularities) are assoc with "adrenal crises" as  I wonder whether she is having vasomotor reactivity to the arrhythmia Not sure but agreed to empiric trial Flecainide 50  mg bid, but for the 1st 10 days she will take 25 mg bid to see if suffices  She will let us know when she goes to 50 bid and will arrange for ECG Thanks SK

## 2023-05-04 ENCOUNTER — Ambulatory Visit: Payer: Medicare Other | Admitting: Neurology

## 2023-05-11 ENCOUNTER — Other Ambulatory Visit: Payer: Self-pay | Admitting: *Deleted

## 2023-05-11 ENCOUNTER — Ambulatory Visit: Attending: Cardiology

## 2023-05-11 ENCOUNTER — Ambulatory Visit (INDEPENDENT_AMBULATORY_CARE_PROVIDER_SITE_OTHER)

## 2023-05-11 VITALS — BP 128/82 | HR 70 | Resp 18 | Wt 296.4 lb

## 2023-05-11 DIAGNOSIS — I493 Ventricular premature depolarization: Secondary | ICD-10-CM

## 2023-05-11 DIAGNOSIS — R002 Palpitations: Secondary | ICD-10-CM

## 2023-05-11 NOTE — Progress Notes (Unsigned)
 ZIO serial # [id number] from office inventory applied to patient using tincture of benzoin.

## 2023-05-11 NOTE — Progress Notes (Signed)
   Nurse Visit   Date of Encounter: 05/11/2023 ID: Connie Haynes, DOB 05-06-1955, MRN [id number]  PCP:  Olive Bass, FNP   [facility] Providers Cardiologist:  None Electrophysiologist:  Sherryl Manges, MD      Visit Details   VS:  BP 128/82 (BP Location: Left Wrist, Patient Position: Sitting, Cuff Size: Normal)   Pulse 70   Resp 18   Wt 296 lb 6.4 oz (134.4 kg)   SpO2 96%   BMI 49.32 kg/m  , BMI Body mass index is 49.32 kg/m.  Wt Readings from Last 3 Encounters:  05/11/23 296 lb 6.4 oz (134.4 kg)  04/09/23 292 lb (132.5 kg)  04/05/23 292 lb 15.9 oz (132.9 kg)     Reason for visit: EKG for flecainide start Performed today: Vitals, EKG, Provider consulted:Dr Turner, and Education Changes (medications, testing, etc.) : none Length of Visit: 20 minutes    Medications Adjustments/Labs and Tests Ordered: Orders Placed This Encounter  Procedures   EKG 12-Lead   No orders of the defined types were placed in this encounter.  Patient states she is doing well with the additional of flecainide but not sure her tachycardia episodes have decreased. Patient had Zio monitor placed while in office today (previously ordered) and instructed on what to do when symptomatic. No questions at this time. Confirmed follow up appointment on 07/07/23 with Francis Dowse, PA  Signed, Festus Holts, RN  05/11/2023 2:51 PM

## 2023-05-14 ENCOUNTER — Ambulatory Visit: Admitting: Family

## 2023-05-18 ENCOUNTER — Encounter: Payer: Self-pay | Admitting: Neurology

## 2023-05-18 ENCOUNTER — Ambulatory Visit: Admitting: Family

## 2023-05-19 ENCOUNTER — Encounter: Payer: Self-pay | Admitting: Internal Medicine

## 2023-05-19 ENCOUNTER — Other Ambulatory Visit: Payer: Self-pay | Admitting: Family

## 2023-05-19 NOTE — Telephone Encounter (Signed)
 I spoke to the patient about her symptoms.  She is having episodes of lightheadedness more than vertigo giving her rest will be head sensation.  These are occurring several times a day and usually last minutes.  She can be sitting or standing but she has not had any spells while lying down.  Most of the spells she is standing.  She has several comorbidities making this difficult to know what is causing her symptoms.  Besides her neuromyelitis causing spinal cord issues, she has cardiac arrhythmias and is on flecainide.  Fortunately, she just completed a 7-day cardiac monitor and just sent the results in.  A second significant issue is adrenal insufficiency and she is on hydrocortisone.  She is allowed to go up on the dose if she feels crisis type symptoms and I recommended that she try a higher dose for few days to see if symptoms improve.  I will go ahead and make an appointment for her to come in Monday afternoon but she can cancel this if symptoms are felt to be due to either her heart or adrenal insufficiency by her other doctors.

## 2023-05-19 NOTE — Telephone Encounter (Signed)
 Spoke to patient made appointment  05/24/2023 @ 330pm

## 2023-05-20 ENCOUNTER — Other Ambulatory Visit: Payer: Self-pay | Admitting: Neurology

## 2023-05-20 DIAGNOSIS — R404 Transient alteration of awareness: Secondary | ICD-10-CM

## 2023-05-23 ENCOUNTER — Other Ambulatory Visit: Payer: Self-pay | Admitting: Urology

## 2023-05-23 DIAGNOSIS — N390 Urinary tract infection, site not specified: Secondary | ICD-10-CM

## 2023-05-24 ENCOUNTER — Ambulatory Visit (INDEPENDENT_AMBULATORY_CARE_PROVIDER_SITE_OTHER): Admitting: Neurology

## 2023-05-24 ENCOUNTER — Encounter: Payer: Self-pay | Admitting: Neurology

## 2023-05-24 ENCOUNTER — Telehealth: Payer: Self-pay | Admitting: Neurology

## 2023-05-24 VITALS — BP 154/75 | HR 62 | Ht 65.0 in | Wt 296.0 lb

## 2023-05-24 DIAGNOSIS — R339 Retention of urine, unspecified: Secondary | ICD-10-CM

## 2023-05-24 DIAGNOSIS — R404 Transient alteration of awareness: Secondary | ICD-10-CM

## 2023-05-24 DIAGNOSIS — R269 Unspecified abnormalities of gait and mobility: Secondary | ICD-10-CM

## 2023-05-24 DIAGNOSIS — G369 Acute disseminated demyelination, unspecified: Secondary | ICD-10-CM | POA: Diagnosis not present

## 2023-05-24 DIAGNOSIS — G2581 Restless legs syndrome: Secondary | ICD-10-CM | POA: Diagnosis not present

## 2023-05-24 DIAGNOSIS — G379 Demyelinating disease of central nervous system, unspecified: Secondary | ICD-10-CM | POA: Diagnosis not present

## 2023-05-24 DIAGNOSIS — Z8669 Personal history of other diseases of the nervous system and sense organs: Secondary | ICD-10-CM

## 2023-05-24 DIAGNOSIS — Z79899 Other long term (current) drug therapy: Secondary | ICD-10-CM | POA: Diagnosis not present

## 2023-05-24 MED ORDER — LACOSAMIDE 50 MG PO TABS: 50.0000 mg | ORAL_TABLET | Freq: Two times a day (BID) | ORAL | 5 refills | Status: DC

## 2023-05-24 NOTE — Progress Notes (Signed)
 [facility]  PATIENT: Connie Haynes DOB: 10-24-1955  REFERRING DOCTOR OR PCP: Tresa Moore, MD SOURCE: Patient, notes from recent hospitalization, imaging and laboratory reports, MRI images personally reviewed.  _________________________________   HISTORICAL  CHIEF COMPLAINT:  Chief Complaint  Patient presents with   Follow-up    Pt in 11 alone Pt states seizure like activity Pt stated swimming headed unable to talk or answer questions Pt states symptoms last 90 seconds Pt states fatigue,chills,shaking     HISTORY OF PRESENT ILLNESS:  Connie Haynes is a 68 y.o. 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  She has tolerated it well and has not had any definite exacerbation.  Motor and sensory symptoms are stable over the last year  However, she has had several spells since January - initially with swimmy headed and about t pass out but not vertigo.    She feels off balanced.  She notes altered awareness, understanding what others were saying but having a blank face and not talking.   First couple spells were mild and due to rapid recovery she was less concerned.  In March, she had a couple more severe episodes and would have fallen with one if husband had not caught.      Spells last 30-90 seconds and last episode was 05/19/2023.    Her friend though her mouth was twitching but no GTC.  Most spells occurred while sitting and 2 while standing.  None while laying down.     She did not have any medication change before the spells started.   She only takes tramodol 4 times a month  She saw ophthalmology last week and she had trouble tracking items.  This had not been a problem before the past couple months.    She is not driving since her health issues worsened.   Her husband drives.    She is reporting more pain in her legs.  She also notes more left leg numbness and more RLS.   Pramipexole has not helped  much. Tramadol has helped the legs some and she only takes once a week or so..    She prefer not to do gabapentin as poorly tolerated and prefers not to do Lyrica.    Lamotrigine caused dizziness.    She takes prn lorazepam but has not taken at bedtime.  Legs bother her less if she sits or is active but more if resting a long time.   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  Both the anti-MOG and the anti-NMO antibodies were negative s since May 21, 2022 we have done 500 mg due to continued low IgG.   Due to itching, she is taking prednisone the day befpre the infusion now with benefit.    MRIs from 2023 showed that the transverse myelitis focus has an improved appearance compared to her initial MRIs.  The demyelinating focus in the brain is reduced in size.  She notes more numbness and sometimes left leg weakness that seems worse towards the end of each cycle.     She has had a few falls due to both balance and strength.   Sometimes trouble getting up from the chair.   She is unable to lift herself up if she is on the ground.      Her LETM and left ON occurred within a few days back in 2021.  Additionally, she had a demyelinating  focus within the brain.  The vision change noticed shortly after presenting to the hospital for her leg/groin numbness,     She had a Covid vaccination 4 months earlier but nothing within a month.      She notes absent sensation below the hips with just a little bit of sensation in the thighs..Arms are not numb.   . She notes mild weakness in legs.   Due to the numbness, she needs to see her feet as she walks but she is able to walk without a cane.    She has dysesthesias on legs that can often be very painful.     She has urinary retention and hesitancy.  She has had a few UTI. Aaron Aas   She will feel some pressure when the bladder is full but not every time.   Similar with bowel.   Scheduled urination and defecation may help some.    She has a left altitudinal  visual field cut (confirmed on recent [facility] VF testing with ophth).  She feels vision may be slightly worse.  She also has recently noted difficulty tracking items  She is on steroid for adrenal insufficiency and had a recent crisis during an infection.      Repeat spine imaging MRIs 07/23/2020 compared them to the previous ones.  The MRIs look better showing no new lesions and fairly complete resolution of the enhancing focus that had extended from the mid cervical spine to the upper thoracic spine.   RLS is better on lorazepam addition to pramipexole.   LETM/NMOSD HISTORY In late June 2021, she had the onset of urinary retention and the next morning had numbness from the waist down.   She initially presented to the ED but left due to the long wait.   She re-presented to [facility] emergency room when she had worsening with more intense numbness, weakness and reduced gait.  MRIs showed a longitudinal enhancing lesion in the lower cervical and upper thoracic spine (C4-T4) and the brain showed 2 foci, one that enhanced near the basal ganglia on the right.    She was admitted for further evaluation and treatment.  She had high dose steroids for 5 days but did not note a benefit.  She then had 5 treatments of plasma exchange..    Strength improved 08/22/2019 and she was able to take a few steps.   She was doing some physical therapy while in the hospital.  By the time of discharge, she was able to use a walker.  She improved further over the next month but continued to have numbness and reduced gait.      We received the results of the [facility] anti-NMO and anti-MOG test.  Both were negative.  MRIs showed a longitudinal enhancing lesion in the lower cervical and upper thoracic spine (C4-T4) and the brain showed 2 foci, one that enhanced near the basal ganglia on the right.  We had discussed that, despite that result, I am very concerned that she is at an elevated risk of a relapse.  Besides the  inflammatory transverse myelitis, she also had an enhancing lesion in the brain.  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  Other tests including vasculitis labs were negative though CNS vasculitis would not always show up in blood work.  She has adrenal insufficiency and is on Cortef.  She also has stage 2 CKD, HTN, WPW (S/p ablation).  I personally reviewed the MRIs of the lumbar spine, thoracic  spine, cervical spine and brain performed 08/10/2019 and 08/11/2019.  She had increased numbness in legs 07/2020.     Imaging review: MRI brain 08/11/2019: There is a 9 mm enhancing focus in the inferior right frontal lobe associated with some mass-effect.  Additionally, there is a small left T2/flair hyperintense focus just above the left internal capsule.  The etiology of the focus lesion is uncertain.  It could represent a focus of demyelination.  Neoplasm cannot be completely ruled out.  The more chronic appearing focus could be demyelinating or ischemic  MRI of the cervical spine and thoracic spine 08/10/2019: Central to posterior enhancing focus within the spinal cord from C4-T4.  This causes some enlargement of the cord.  Additionally there is ACDF at C5-C7 and DJD at C3-C4 and C4-C5.  MRI of the lumbar spine 08/10/2019: There is anterolisthesis of L3 upon L4 associated with facet hypertrophy and protrusion of the uncovered disc.  There is foraminal narrowing, more on the right.  Milder degenerative changes are noted at some of the lumbar levels but no spinal stenosis or significant nerve root compression.  MRI of the cervical and thoracic spine 01/10/2020 showed greatly improved appearance of the abnormal signal.  On this MRI, there is just minimal T2 hyperintensity adjacent to C6-C7 and mild abnormal enhancement posteriorly adjacent to C7.  MRI of the brain 01/10/2020 showed interval improvement of the enhancing focus in the inferior right frontal lobe that have been seen on the 08/11/2019 MRI.  There  was still a small focus of enhancement in that region.  Several other T2/FLAIR hyperintense foci in the hemispheres are more consistent with minimal stable chronic microvascular ischemic change.  MRI of the cervical and thoracic spine 07/23/2020 showed complete resolution of the enhancing focus from C4-T4 that has been seen on previous MRIs.   However, there appears to be mild atrophy at C7-T1.    MRI of the brain 10/05/2021 showed a right inferior frontal/medial temporal focus that is hyperintense on T2 weighted and FLAIR images consistent with remote demyelination.  In 2021, this focus was larger, associated with edema and enhanced.  It no longer is associated with edema or enhances.   Remote lacunar infarction adjacent to the basal ganglia on the left, unchanged compared to the previous MRI.  Few scattered T2/FLAIR hyperintense foci in the hemispheres consistent with minimal age appropriate chronic microvascular ischemic changes.  MRI of the cervical and thoracic spine 822/2023 showed resolution of the C4-T4 lesion noted on the previous MRIs.  Laboratory review While in the hospital 08/2019, she had a LP (CSF was normal except slightly elevated protein); NMO Ab (negative), anti-MOG (negative).  Additionally she had ANCA, HIV, TB, B12, rheumatoid factor, anti-Jo 1, ENA, HSV, ACE.  These were all negative or normal.    July 2021:  Hep B cAb, Hep B sAg, Hep B sAb all negative.    ANA was negative  Repeat [facility] anti-Mog and anti-NMO were negative 09/2019   REVIEW OF SYSTEMS: Constitutional: No fevers, chills, sweats, or change in appetite Eyes: No visual changes, double vision, eye pain Ear, nose and throat: No hearing loss, ear pain, nasal congestion, sore throat Cardiovascular: No chest pain, palpitations Respiratory:  No shortness of breath at rest or with exertion.   No wheezes GastrointestinaI: No nausea, vomiting, diarrhea, abdominal pain, fecal incontinence Genitourinary:  No dysuria, urinary  retention or frequency.  No nocturia. Musculoskeletal:  No neck pain, back pain Integumentary: No rash, pruritus, skin lesions Neurological: as above  Psychiatric: No depression at this time.  No anxiety Endocrine: No palpitations, diaphoresis, change in appetite, change in weigh or increased thirst Hematologic/Lymphatic:  No anemia, purpura, petechiae. Allergic/Immunologic: No itchy/runny eyes, nasal congestion, recent allergic reactions, rashes  ALLERGIES: Allergies  Allergen Reactions   Codeine Nausea And Vomiting    Can take if takes phernergan   Hydrocodone Nausea Only   Lamotrigine     "dizziness, shakiness and just feeling not right."   Macrobid [Nitrofurantoin] Diarrhea and Nausea And Vomiting   Robaxin [Methocarbamol] Rash    HOME MEDICATIONS:  Current Outpatient Medications:    carvedilol (COREG) 25 MG tablet, Take 1 tablet (25 mg total) by mouth 2 (two) times daily., Disp: 180 tablet, Rfl: 3   citalopram (CELEXA) 40 MG tablet, Take 1 tablet (40 mg total) by mouth daily., Disp: 90 tablet, Rfl: 3   flecainide (TAMBOCOR) 50 MG tablet, Take 1.5 tablets (75 mg total) by mouth 2 (two) times daily., Disp: 270 tablet, Rfl: 1   fosfomycin (MONUROL) 3 g PACK, Take 3 g by mouth every 10 days, Disp: 9 g, Rfl: 3   furosemide (LASIX) 40 MG tablet, TAKE 2 TABLETS BY MOUTH EVERY OTHER DAY, Disp: 90 tablet, Rfl: 3   hydrocortisone (CORTEF) 10 MG tablet, Take 5-10 mg by mouth See admin instructions. Take 10mg  in the AM and 5mg  in the afternoon., Disp: , Rfl:    lacosamide (VIMPAT) 50 MG TABS tablet, Take 1 tablet (50 mg total) by mouth 2 (two) times daily., Disp: 60 tablet, Rfl: 5   LINZESS 72 MCG capsule, Take 1 capsule by mouth once daily, Disp: 90 capsule, Rfl: 0   LORazepam (ATIVAN) 0.5 MG tablet, TAKE 1 TABLET BY MOUTH TWICE DAILY AS NEEDED, Disp: 60 tablet, Rfl: 5   Multiple Vitamin (MULTIVITAMIN ADULT PO), Take 1 tablet by mouth daily., Disp: , Rfl:    ondansetron (ZOFRAN) 4 MG  tablet, Take 4 mg by mouth every 8 (eight) hours as needed., Disp: , Rfl:    potassium chloride (KLOR-CON) 10 MEQ tablet, Take 10 mEq by mouth as needed. WITH LASIX, Disp: , Rfl:    pramipexole (MIRAPEX) 1 MG tablet, TAKE 1 PO BID, Disp: 180 tablet, Rfl: 3   predniSONE (DELTASONE) 20 MG tablet, Take 3 pills po the day before infusions., Disp: 12 tablet, Rfl: 0   riTUXimab (RITUXAN IV), Inject into the vein., Disp: , Rfl:    telmisartan-hydrochlorothiazide (MICARDIS HCT) 80-12.5 MG tablet, Take 1 tablet by mouth daily., Disp: 90 tablet, Rfl: 3   tiZANidine (ZANAFLEX) 4 MG tablet, TAKE 1 TO 1 & 1/2 (ONE & ONE-HALF) TABLETS BY MOUTH THREE TIMES DAILY AS NEEDED, Disp: 270 tablet, Rfl: 2   traMADol (ULTRAM) 50 MG tablet, TAKE 1 TABLET BY MOUTH EVERY 8 HOURS, Disp: 90 tablet, Rfl: 0 No current facility-administered medications for this visit.  Facility-Administered Medications Ordered in Other Visits:    chlorhexidine (HIBICLENS) 4 % liquid 4 application, 60 mL, Topical, Once, Dixon, Katharina Caper, PA-C   technetium tetrofosmin (TC-MYOVIEW) injection 31.7 milli Curie, 31.7 millicurie, Intravenous, Once PRN, Lars Masson, MD  PAST MEDICAL HISTORY: Past Medical History:  Diagnosis Date   Adrenal insufficiency (HCC)    Anemia    denies   Anxiety    Basal cell carcinoma    eye lid   Chronic kidney disease, stage 3 (HCC)    Cough    hx. dry cough chronic   Disorder of tear duct system    frequent tearing of  eyes   GERD (gastroesophageal reflux disease)    H/O: whooping cough 2000   History of kidney infection 2015   ? sepsis   History of kidney stones    History of kidney stones    Hyperlipidemia    Hypertension    IBS (irritable bowel syndrome)    controlled with med   Osteoarthritis    arthritis -joints-limited ROM shoulders more on right   PONV (postoperative nausea and vomiting)     per pt hx of low BP following shoulder surgery in 05-2015; was managed in hospital with IV  fluids and d/c BP being taken a tthe time; pt was dced and F/U with endcrinoopgist who dx with adrenal insurffiency and started steroid therapy; treatmetnt with positive outome, now managed chronically on hydrocortisone tablets;   Sleep apnea    no longer using cpap   Stroke [facility])    per patient "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 Terril Fetters)   APPENDECTOMY     CARDIAC ELECTROPHYSIOLOGY STUDY AND ABLATION     '93 for WPW syndrome   CARPAL TUNNEL RELEASE Right 12/21/2014   Procedure: RIGHT CARPAL TUNNEL RELEASE ;  Surgeon: Winston Hawking, MD;  Location: [facility] OR;  Service: Orthopedics;  Laterality: Right;   CARPAL TUNNEL RELEASE Left    CERVICAL SPINE SURGERY     cervical fusion with hardware retained   COLON SURGERY     "colon detached from abdomen"   COLONOSCOPY WITH PROPOFOL N/A 05/10/2014   Procedure: COLONOSCOPY WITH PROPOFOL;  Surgeon: Tami Falcon, MD;  Location: [facility] END[facility]COPY;  Service: Endoscopy;  Laterality: N/A;   CYSTOSCOPY/RETROGRADE/URETEROSCOPY Left 12/18/2013   Procedure: CYSTOSCOPY/RETROGRADE/LEFT STENT;  Surgeon: Willye Harvey, MD;  Location: [facility] ORS;  Service: Urology;  Laterality: Left;   ESOPHAGOGASTRODUODENOSCOPY (EGD) WITH PROPOFOL N/A 05/10/2014   Procedure: ESOPHAGOGASTRODUODENOSCOPY (EGD) WITH PROPOFOL;  Surgeon: Tami Falcon, MD;  Location: [facility];  Service: Endoscopy;  Laterality: N/A;   EYE SURGERY     EYELID CARCINOMA EXCISION     INSERTION OF DIALYSIS CATHETER N/A 08/16/2019   Procedure: INSERTION OF DIALYSIS CATHETER;  Surgeon:  Prudy Brownie, DO;  Location: [facility] END[facility];  Service: Pulmonary;  Laterality: N/A;   JOINT REPLACEMENT     KNEE ARTHROSCOPY Left 07/16/2016   REVERSE SHOULDER ARTHROPLASTY Right 06/29/2014   Procedure: REVERSE TOTAL SHOULDER ARTHROPLASTY;  Surgeon: Winston Hawking, MD;  Location: [facility] OR;  Service: Orthopedics;  Laterality: Right;   REVISION TOTAL SHOULDER TO REVERSE TOTAL SHOULDER Left 05/15/2015   REVISION TOTAL SHOULDER TO REVERSE TOTAL SHOULDER Left 05/15/2015   Procedure: LEFT SHOULDER REVISION TOTAL SHOULDER ARTHRPLASTY TO REVERSE TOTAL SHOULDER ARTHROPLASTY ;  Surgeon: Winston Hawking, MD;  Location: [facility] OR;  Service: Orthopedics;  Laterality: Left;   SPINE SURGERY     tear duct surgery     TOTAL ABDOMINAL HYSTERECTOMY     TOTAL HIP ARTHROPLASTY     x 3(rt x2, lt x1)   TOTAL KNEE ARTHROPLASTY Right 08/08/2013   Procedure: RIGHT TOTAL KNEE ARTHROPLASTY WITH SAPHENOUS NERVE  SCAR EXCISION;  Surgeon: Shelda Pal, MD;  Location: [facility];  Service: Orthopedics;  Laterality: Right;   TOTAL KNEE ARTHROPLASTY Left 08/25/2016   Procedure: LEFT TOTAL KNEE ARTHROPLASTY;  Surgeon: Durene Romans, MD;  Location: [facility];  Service: Orthopedics;  Laterality: Left;  70 mins; Adductor block   TOTAL SHOULDER ARTHROPLASTY Left 07/15/2012   Procedure: LEFT TOTAL SHOULDER ARTHROPLASTY;  Surgeon: Verlee Rossetti, MD;  Location: [facility] OR;  Service: Orthopedics;  Laterality: Left;   TOTAL SHOULDER REVISION Right 12/21/2014   Procedure: RIGHT SHOULDER POLY EXCHANGE ;  Surgeon: Beverely Low, MD;  Location: [facility] OR;  Service: Orthopedics;  Laterality: Right;   URETEROSCOPY  stent placement    FAMILY HISTORY: Family History  Problem Relation Age of Onset   Heart disease Mother    Hypertension Mother    Hyperlipidemia Mother    Heart attack Mother    Heart failure Mother    Leukemia Brother    Cancer Maternal Uncle        bone   Heart disease Maternal Grandfather    Heart attack Maternal Grandfather    Cancer  Paternal Grandfather    Lung cancer Paternal Grandfather    Seizures Neg Hx     SOCIAL HISTORY:  Social History   Socioeconomic History   Marital status: Married    Spouse name: Sport and exercise psychologist   Number of children: 0   Years of education: Not on file   Highest education level: Not on file  Occupational History   Occupation: Retired     Associate Professor: [employer]  Tobacco Use   Smoking status: Never   Smokeless tobacco: Never  Vaping Use   Vaping status: Never Used  Substance and Sexual Activity   Alcohol use: No    Alcohol/week: 0.0 standard drinks of alcohol   Drug use: No   Sexual activity: Not Currently    Partners: Male  Other Topics Concern   Not on file  Social History Narrative   Marital Status:  Married Armed forces operational officer)   Living Situation: Lives with spouse   Occupation: Psychologist, 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   Tobacco Use/Exposure:  None   Alcohol Use: None   Drug Use:  None   Diet:  Low Fat/Low Carb   Exercise:  3-4 Days per week   Hobbies:  Reading, Travel   Left handed    Caffeine: 1 cup coffee every morning, diet coke (rare)   Social Drivers of Corporate investment banker Strain: Low Risk  (09/01/2022)   Overall Financial Resource Strain (CARDIA)    Difficulty of Paying Living Expenses: Not very hard  Food Insecurity: No Food Insecurity (09/01/2022)   Hunger Vital Sign    Worried About Running Out of Food in the Last Year: Never true    Ran Out of Food in the Last Year: Never true  Transportation Needs: No Transportation Needs (09/01/2022)   PRAPARE - Administrator, Civil Service (Medical): No    Lack of Transportation (Non-Medical): No  Physical Activity: Insufficiently Active (09/01/2022)   Exercise Vital Sign    Days of Exercise per Week: 5 days    Minutes of Exercise per Session: 20 min  Stress: Stress Concern Present (09/01/2022)   [employer] of Occupational Health - Occupational Stress Questionnaire    Feeling of  Stress : To some extent  Social Connections: Moderately Isolated (09/01/2022)   Social Connection and Isolation Panel [NHANES]    Frequency  of Communication with Friends and Family: Twice a week    Frequency of Social Gatherings with Friends and Family: Twice a week    Attends Religious Services: Never    Database administrator or Organizations: No    Attends Banker Meetings: Never    Marital Status: Married  Catering manager Violence: Not At Risk (09/02/2022)   Humiliation, Afraid, Rape, and Kick questionnaire    Fear of Current or Ex-Partner: No    Emotionally Abused: No    Physically Abused: No    Sexually Abused: No     PHYSICAL EXAM  Vitals:   05/24/23 1520  BP: (!) 154/75  Pulse: 62  Weight: 296 lb (134.3 kg)  Height: 5\' 5"  (1.651 m)    Body mass index is 49.26 kg/m.   General: The patient is well-developed and well-nourished and in no acute distress  HEENT:  Head is [place]/AT.  Sclera are anicteric.   Neck: No carotid bruits are noted.  The neck is tender in lower cervical paraspinal muscles  Skin: Extremities are without rash or edema.  Neurologic Exam  Mental status: The patient is alert and oriented x 3 at the time of the examination. The patient has apparent normal recent and remote memory, with an apparently normal attention span and concentration ability.   Speech is normal.  Cranial nerves: She had difficulty with saccades but was able to track a larger item.  Extraocular movements are full.  Color vision is reduced OS, VA is reduced OS but she can still read large print  Facial symmetry is present. There is good facial sensation to soft touch bilaterally.Facial strength is normal.  Trapezius and sternocleidomastoid strength is normal. No dysarthria is noted.   No obvious hearing deficits are noted.  Motor:  Muscle bulk is normal.   Tone is normal.  Strength is 4+/5 deltoids, 5 biceps and 4+/5 left tricep, 3 to 4 -/5 in proximal legs, 4/5 with  quadriceps and 4 -/5 in the feet and ankles  Sensory: She has absent vibration sensation in the legs and absent touch/ temperaturein in left leg and reduced in right leg (notes sensation more in the thighs than lower leg) and lower flanks to lower chest, left worse than right in trunk.   Slight reduced vibration sensation in 5th fingers relative to 2nd fingers.    Coordination: Cerebellar testing reveals good finger-nose-finger.  She has a poor heel-to-shin.    Gait and station: She is unable to stand up from the chair without using her arms for support.  She is walking without a cane or walker but is ataxic.  She cannot do a tandem walk.  The Romberg is borderline.  Reflexes: Deep tendon reflexes are symmetric and normal in arms, cross adductors at the knees.  No clonus.Marland Kitchen        DIAGNOSTIC DATA (LABS, IMAGING, TESTING) - I reviewed patient records, labs, notes, testing and imaging myself where available.  Lab Results  Component Value Date   WBC 8.1 04/05/2023   HGB 12.4 04/05/2023   HCT 38.1 04/05/2023   MCV 92 04/05/2023   PLT 252 04/05/2023      Component Value Date/Time   NA 139 12/04/2022 0908   K 3.9 12/04/2022 0908   CL 101 12/04/2022 0908   CO2 23 12/04/2022 0908   GLUCOSE 98 12/04/2022 0908   GLUCOSE 105 (H) 06/04/2022 1043   BUN 16 12/04/2022 0908   CREATININE 0.92 12/04/2022 0908   CREATININE 0.87  06/18/2015 1207   CALCIUM 9.4 12/04/2022 0908   PROT 6.4 06/04/2022 1043   ALBUMIN 4.3 06/04/2022 1043   ALBUMIN 4.0 08/10/2019 2356   AST 12 06/04/2022 1043   ALT 10 06/04/2022 1043   ALKPHOS 82 06/04/2022 1043   BILITOT 0.5 06/04/2022 1043   GFRNONAA >60 08/24/2019 0417   GFRAA >60 08/24/2019 0417   Lab Results  Component Value Date   CHOL 79 08/17/2019   HDL 22 (L) 08/17/2019   LDLCALC 29 08/17/2019   TRIG 141 08/17/2019   CHOLHDL 3.6 08/17/2019   No results found for: "HGBA1C" Lab Results  Component Value Date   VITAMINB12 376 08/11/2019   Lab  Results  Component Value Date   TSH 3.119 08/11/2019       ASSESSMENT AND PLAN  Demyelinating disease (HCC) - Plan: MR BRAIN W WO CONTRAST, Anti-Aquaporin (AQP4), Serum, CANCELED: EEG adult  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HCC)  Transient alteration of awareness - Plan: MR BRAIN W WO CONTRAST, CANCELED: EEG adult  History of optic neuritis  Gait disturbance  High risk medication use  Restless leg syndrome  Urinary retention    1.   She will continue Rituxan at 500 mg q6 months. A BETTER  2.   Spells of transient alteration of awareness - check EEG and MRi 3.    Vimpat for her dysesthesias.  She could not tolerate gabapentin or lamotrigine.  Tricyclic's or duloxetine might make the restless legs worse  Continue lorazepam at bedtime and continue pramipexole (can not tolerate gabapentin).   4.   Stay active and exercise as tolerated.   .   5.   Return in 5-6 months or sooner if there are new or worsening neurologic symptoms.     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  Trena Dunavan A. Godwin Lat, MD, [facility] 05/24/2023, 7:31 PM Certified in Neurology, Clinical Neurophysiology, Sleep Medicine and Neuroimaging  [facility] [address], [address] [place], [place] [address] [phone]

## 2023-05-24 NOTE — Telephone Encounter (Signed)
 no auth required sent to GI [phone]

## 2023-05-25 ENCOUNTER — Emergency Department (HOSPITAL_COMMUNITY)

## 2023-05-25 ENCOUNTER — Encounter: Payer: Self-pay | Admitting: Neurology

## 2023-05-25 ENCOUNTER — Telehealth: Payer: Self-pay | Admitting: Neurology

## 2023-05-25 ENCOUNTER — Ambulatory Visit (INDEPENDENT_AMBULATORY_CARE_PROVIDER_SITE_OTHER): Admitting: Neurology

## 2023-05-25 ENCOUNTER — Emergency Department (HOSPITAL_COMMUNITY)
Admission: EM | Admit: 2023-05-25 | Discharge: 2023-05-25 | Discharge status: Against Medical Advice | Attending: Emergency Medicine | Admitting: Emergency Medicine

## 2023-05-25 ENCOUNTER — Other Ambulatory Visit: Payer: Self-pay

## 2023-05-25 DIAGNOSIS — R404 Transient alteration of awareness: Secondary | ICD-10-CM

## 2023-05-25 DIAGNOSIS — R001 Bradycardia, unspecified: Secondary | ICD-10-CM | POA: Diagnosis not present

## 2023-05-25 DIAGNOSIS — R200 Anesthesia of skin: Secondary | ICD-10-CM | POA: Diagnosis not present

## 2023-05-25 DIAGNOSIS — R4182 Altered mental status, unspecified: Secondary | ICD-10-CM

## 2023-05-25 DIAGNOSIS — R55 Syncope and collapse: Secondary | ICD-10-CM | POA: Diagnosis not present

## 2023-05-25 DIAGNOSIS — I493 Ventricular premature depolarization: Secondary | ICD-10-CM | POA: Diagnosis not present

## 2023-05-25 DIAGNOSIS — Z5321 Procedure and treatment not carried out due to patient leaving prior to being seen by health care provider: Secondary | ICD-10-CM | POA: Diagnosis not present

## 2023-05-25 DIAGNOSIS — R0602 Shortness of breath: Secondary | ICD-10-CM | POA: Diagnosis not present

## 2023-05-25 LAB — CBC WITH DIFFERENTIAL/PLATELET
Abs Immature Granulocytes: 0.02 10*3/uL (ref 0.00–0.07)
Basophils Absolute: 0.1 10*3/uL (ref 0.0–0.1)
Basophils Relative: 1 %
Eosinophils Absolute: 0.2 10*3/uL (ref 0.0–0.5)
Eosinophils Relative: 3 %
HCT: 42.7 % (ref 36.0–46.0)
Hemoglobin: 13.7 g/dL (ref 12.0–15.0)
Immature Granulocytes: 0 %
Lymphocytes Relative: 18 %
Lymphs Abs: 1.3 10*3/uL (ref 0.7–4.0)
MCH: 30.6 pg (ref 26.0–34.0)
MCHC: 32.1 g/dL (ref 30.0–36.0)
MCV: 95.5 fL (ref 80.0–100.0)
Monocytes Absolute: 0.4 10*3/uL (ref 0.1–1.0)
Monocytes Relative: 6 %
Neutro Abs: 5.2 10*3/uL (ref 1.7–7.7)
Neutrophils Relative %: 72 %
Platelets: 268 10*3/uL (ref 150–400)
RBC: 4.47 MIL/uL (ref 3.87–5.11)
RDW: 13 % (ref 11.5–15.5)
WBC: 7.2 10*3/uL (ref 4.0–10.5)
nRBC: 0 % (ref 0.0–0.2)

## 2023-05-25 LAB — COMPREHENSIVE METABOLIC PANEL WITH GFR
ALT: 13 U/L (ref 0–44)
AST: 17 U/L (ref 15–41)
Albumin: 4.2 g/dL (ref 3.5–5.0)
Alkaline Phosphatase: 67 U/L (ref 38–126)
Anion gap: 11 (ref 5–15)
BUN: 20 mg/dL (ref 8–23)
CO2: 23 mmol/L (ref 22–32)
Calcium: 9.5 mg/dL (ref 8.9–10.3)
Chloride: 105 mmol/L (ref 98–111)
Creatinine, Ser: 0.89 mg/dL (ref 0.44–1.00)
GFR, Estimated: >60 mL/min (ref >60)
Glucose, Bld: 97 mg/dL (ref 70–99)
Potassium: 3.6 mmol/L (ref 3.5–5.1)
Sodium: 139 mmol/L (ref 135–145)
Total Bilirubin: 0.7 mg/dL (ref 0.0–1.2)
Total Protein: 6.7 g/dL (ref 6.5–8.1)

## 2023-05-25 LAB — TROPONIN I (HIGH SENSITIVITY): Troponin I (High Sensitivity): 8 ng/L (ref <18)

## 2023-05-25 LAB — ANTI-AQUAPORIN (AQP4), SERUM: AQP4 Antibody, Cell-based IFA: NEGATIVE

## 2023-05-25 MED ORDER — LORAZEPAM 1 MG PO TABS: 0.5000 mg | ORAL_TABLET | Freq: Once | ORAL | Status: DC

## 2023-05-25 NOTE — Telephone Encounter (Signed)
 [name] was seen in the office yesterday (for details see my note from yesterday).  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  Besides the LETM, she also had an enhancing lesion in the brain at diagnosis  Due to spells of altered awareness she had an EEG today.  As part of the EEG she did 2 to 3 minutes of hyperventilation.  She felt very strange afterwards and then started to shaking (more like chills rather than seizure).  She also became more confused.  At the end of the EEG, she was unable to stand up (she is normally weak in her legs but is able to walk, often without support)  I evaluated her.  There was no seizure activity.  She was able to move her legs but more weakly than typical.  She was not her typical self and also seem very anxious.  I helped her get into her car.  I advised the husband that I recommend that she go to the emergency room for evaluation.  I am concerned that she is having more neurologic symptoms.  I am hoping that she is able to get an MRI of the brain, cervical spine and thoracic spine.  We need to rule out additional demyelination.  If present we will need to consider a different medication besides Rituxan.  At additionally, because of her fluctuating nature and chills, I am hoping that she can be evaluated for UTI, pneumonia and metabolic changes.  Note: The EEG was essentially normal and did not show any epileptiform activity.

## 2023-05-25 NOTE — ED Triage Notes (Signed)
 Pt. Was having EEG this morning and during the EEG they had her breathing fast and during the test she  developed bilateral leg numbness and sob.  Pt. Has bilateral leg numbness from her knees down, but it became worse during the EEG and she is having numbness from her thighs down.   Pt. Denies any pain.   Pt. Having syncope

## 2023-05-25 NOTE — Progress Notes (Signed)
   [facility]  EEG (ELECTROENCEPHALOGRAM) REPORT   STUDY DATE: 05/25/2023 PATIENT NAME: Connie Haynes DOB: 1955/03/30 MRN: [id number]  ORDERING CLINICIAN: Jadalyn Oliveri A. Godwin Lat, MD. PhD  TECHNOLOGIST: Arrie Lares, REEGT TECHNIQUE: Electroencephalogram was recorded utilizing standard 10-20 system of lead placement and reformatted into average and bipolar montages.  RECORDING TIME: 25 minutes  CLINICAL INFORMATION: 68 year old woman with history of demyelinating disorder with more recent episodes of transient alteration of awareness  FINDINGS: A digital EEG was performed while the patient was awake.  Sleep was not recorded.. While awake and most alert there was a 9 hz posterior dominant rhythm. Voltages and frequencies were symmetric.  There were no focal, lateralizing, epileptiform activity or seizures seen.  Photic stimulation had a normal driving response. Hyperventilation and recovery did not change the underlying rhythms.  After hyperventilation, there was excessive muscle artifact.  Patient was shivering and blankets were provided.  EKG channel shows normal sinus rhythm.  The patient felt weak at the end of the study (see other note from today)  IMPRESSION: This is a normal EEG while the patient was awake.  No epileptiform activity was observed.  The patient had shivering after hyperventilation causing excessive muscle artifact.  The patient felt weak at the end of the study.   INTERPRETING PHYSICIAN:   Jonae Renshaw A. Godwin Lat, MD, PhD, [facility] Certified in Neurology, Clinical Neurophysiology, Sleep Medicine, Pain Medicine and Neuroimaging  [facility] [address] [place], [place] [address] [phone]

## 2023-05-25 NOTE — ED Provider Triage Note (Signed)
 Emergency Medicine Provider Triage Evaluation Note  Connie Haynes , a 68 y.o. female  was evaluated in triage.  Pt complains of altered mental status. States she has been having these episodes of confusion intermittently fro some time. Saw neurology and had an EEG today. During the EEG, they required her to hyperventilate for 3 minutes. During this episode, she started to feel very lightheaded and had worsening numbness in her legs. Notes that she has transverse myelitis and has numbness at baseline, however it usually ends at her knees and this time it spread to above her knees. She is usually able to walk but was unable to. EEG started at 11 am this morning. States now her symptoms have improved slightly but not very much. She still has worsening numbness and lightheadedness. No chest pain or shortness of breath. Given her EEG was normal, Neurology recommended she come here for MRI of her brain to evaluate these episodes of confusion as well as cervical, thoracic, and lumbar spine to evaluate for worsening TM symptoms given new numbness. She also has frequent UTIs and wants this evaluated as well. No urinary symptoms, loss of bowel or bladder function or saddle paraesthesias.   Review of Systems  Positive:  Negative:   Physical Exam  BP (!) 159/77 (BP Location: Left Arm)   Pulse (!) 53   Temp 97.8 F (36.6 C) (Oral)   Resp 18   Ht 5\' 5"  (1.651 m)   Wt 134.3 kg   SpO2 100%   BMI 49.26 kg/m  Gen:   Awake, no distress   Resp:  Normal effort  MSK:   Moves extremities without difficulty  Other:   Medical Decision Making  Medically screening exam initiated at 1:22 PM.  Appropriate orders placed.  JILLIEN YAKEL was informed that the remainder of the evaluation will be completed by another provider, this initial triage assessment does not replace that evaluation, and the importance of remaining in the ED until their evaluation is complete.  Work-up initiated   [employer] A, PA-C 05/25/23  1331

## 2023-05-25 NOTE — ED Triage Notes (Deleted)
 Severe abd pain, went to ED yesterday and d/c

## 2023-05-25 NOTE — ED Notes (Signed)
 Pt states she's been here for too long and can't sit any longer. Pt states she is leaving.

## 2023-05-26 ENCOUNTER — Other Ambulatory Visit

## 2023-05-27 ENCOUNTER — Encounter: Payer: Self-pay | Admitting: Neurology

## 2023-05-30 ENCOUNTER — Ambulatory Visit
Admission: RE | Admit: 2023-05-30 | Discharge: 2023-05-30 | Discharge status: Home / Self Care | Source: Ambulatory Visit | Attending: Neurology | Admitting: Neurology

## 2023-05-30 DIAGNOSIS — R404 Transient alteration of awareness: Secondary | ICD-10-CM | POA: Diagnosis not present

## 2023-05-30 DIAGNOSIS — G379 Demyelinating disease of central nervous system, unspecified: Secondary | ICD-10-CM

## 2023-05-30 MED ORDER — GADOPICLENOL 0.5 MMOL/ML IV SOLN
10.0000 mL | Freq: Once | INTRAVENOUS | Status: AC | PRN
  Administered 2023-05-30: 10 mL via INTRAVENOUS

## 2023-05-31 ENCOUNTER — Telehealth: Payer: Self-pay | Admitting: *Deleted

## 2023-05-31 ENCOUNTER — Encounter: Payer: Self-pay | Admitting: Neurology

## 2023-05-31 ENCOUNTER — Telehealth: Payer: Self-pay

## 2023-05-31 DIAGNOSIS — I10 Essential (primary) hypertension: Secondary | ICD-10-CM

## 2023-05-31 NOTE — Progress Notes (Signed)
 Complex Care Management Note  Care Guide Note 05/31/2023 Name: Connie Haynes MRN: [id number] DOB: 1955/05/17  Connie Haynes is a 68 y.o. year old female who sees Adra Alanis, FNP for primary care. I reached out to Severa Daniels by phone today to offer complex care management services.  Ms. Araque was given information about Complex Care Management services today including:   The Complex Care Management services include support from the care team which includes your Nurse Care Manager, Clinical Social Worker, or Pharmacist.  The Complex Care Management team is here to help remove barriers to the health concerns and goals most important to you. Complex Care Management services are voluntary, and the patient may decline or stop services at any time by request to their care team member.   Complex Care Management Consent Status: Patient agreed to services and verbal consent obtained.   Follow up plan:  Telephone appointment with complex care management team member scheduled for:  06/07/2023  Encounter Outcome:  Patient Scheduled  Kandis Ormond, CMA, [place]  [facility], [facility] Guide Direct Dial: [phone]  Fax: [phone] Website: [email or web address]

## 2023-06-01 ENCOUNTER — Encounter: Payer: Self-pay | Admitting: "Endocrinology

## 2023-06-01 ENCOUNTER — Ambulatory Visit (INDEPENDENT_AMBULATORY_CARE_PROVIDER_SITE_OTHER): Admitting: "Endocrinology

## 2023-06-01 VITALS — BP 110/80 | HR 62 | Ht 65.0 in | Wt 297.0 lb

## 2023-06-01 DIAGNOSIS — E2740 Unspecified adrenocortical insufficiency: Secondary | ICD-10-CM | POA: Diagnosis not present

## 2023-06-01 DIAGNOSIS — I959 Hypotension, unspecified: Secondary | ICD-10-CM

## 2023-06-01 NOTE — Patient Instructions (Signed)
 Skip the hydrocortisone  the night prior to the test. Skip the hydrocortisone  dose the morning of test. You can take the morning dose after the test.

## 2023-06-01 NOTE — Progress Notes (Signed)
 Outpatient Endocrinology Note Connie Newcomer, Connie Haynes    AKERA SNOWBERGER 1955/02/26 [id number]  Referring Provider: Adra Alanis,* Primary Care Provider: Adra Alanis, FNP Reason for consultation: Subjective   Assessment & Plan  Diagnoses and all orders for this visit:  Adrenal insufficiency (HCC) -     TSH -     T4, free  Hypotension, unspecified hypotension type    2018: Originally diagnosed due to BP drops, throw up all night, saw a gastroenterologist and couldn't be found of any diagnosis Patient was getting cortisone shots in shoulders once a week at one point which was thought to have caused adrenal insufficiency-saw an endocrinologist then, had ACTH stim test per patient recall (no result available) Currently, reports lowest BP 69/48 with pulse of 54, feels lie she is going to pass out and weak, even on current  She is currently on hydrocortisone  10mg  at 8am and 5 mg at 2 pm since around 2018, with still some spells of low BP in between (Na/K WNL)-patient doubles it up in days of crises 06/01/23: Check 8 AM cortisol: Skip the hydrocortisone  the night prior to the test. Skip the hydrocortisone  dose the morning of test. You can take the morning dose after the test.   Return in about 4 weeks (around 06/29/2023) for visit and 8 am labs before next visit.   I have reviewed current medications, nurse's notes, allergies, vital signs, past medical and surgical history, family medical history, and social history for this encounter. Counseled patient on symptoms, examination findings, lab findings, imaging results, treatment decisions and monitoring and prognosis. The patient understood the recommendations and agrees with the treatment plan. All questions regarding treatment plan were fully answered.  Connie Newcomer, Connie Haynes  06/01/23   History of Present Illness HPI  Connie Haynes is a 68 y.o. year old female who presents for evaluation of 2018.  Current  regimen: She is currently on hydrocortisone  10mg  at 8am and 5 mg at 2 pm since 2018 Patient doubles it up in days of crises  Originally diagnosed due to BP drops, throw up all night, saw a gastroenterologist and couldn't be found of any diagnosis Patient was getting cortisone shots in shoulders once a week at one point which was thought to have caused adrenal insufficiency-saw an endocrinologist then, had ACTH stim test per patient recall (no result available) Currently, reports lowest BP 69/48 with pulse of 54, feels lie she is going to pass out and weak, even on current  Patient has seen cardiologist without a diagnosis    Physical Exam  BP 110/80   Pulse 62   Ht 5\' 5"  (1.651 m)   Wt 297 lb (134.7 kg)   SpO2 96%   BMI 49.42 kg/m    Constitutional: well developed, well nourished Head: normocephalic, atraumatic Eyes: sclera anicteric, no redness Neck: supple Lungs: normal respiratory effort Neurology: alert and oriented Skin: dry, no appreciable rashes Musculoskeletal: no appreciable defects Psychiatric: normal mood and affect   Current Medications Patient's Medications  New Prescriptions   No medications on file  Previous Medications   CARVEDILOL  (COREG ) 25 MG TABLET    Take 1 tablet (25 mg total) by mouth 2 (two) times daily.   CITALOPRAM  (CELEXA ) 40 MG TABLET    Take 1 tablet (40 mg total) by mouth daily.   FLECAINIDE  (TAMBOCOR ) 50 MG TABLET    Take 1.5 tablets (75 mg total) by mouth 2 (two) times daily.   FOSFOMYCIN (MONUROL ) 3 G PACK  Take 3 g by mouth every 10 days   FUROSEMIDE  (LASIX ) 40 MG TABLET    TAKE 2 TABLETS BY MOUTH EVERY OTHER DAY   HYDROCORTISONE  (CORTEF ) 10 MG TABLET    Take 5-10 mg by mouth See admin instructions. Take 10mg  in the AM and 5mg  in the afternoon.   LACOSAMIDE  (VIMPAT ) 50 MG TABS TABLET    Take 1 tablet (50 mg total) by mouth 2 (two) times daily.   LINZESS  72 MCG CAPSULE    Take 1 capsule by mouth once daily   LORAZEPAM  (ATIVAN ) 0.5 MG TABLET     TAKE 1 TABLET BY MOUTH TWICE DAILY AS NEEDED   MULTIPLE VITAMIN (MULTIVITAMIN ADULT PO)    Take 1 tablet by mouth daily.   ONDANSETRON  (ZOFRAN ) 4 MG TABLET    Take 4 mg by mouth every 8 (eight) hours as needed.   POTASSIUM CHLORIDE  (KLOR-CON ) 10 MEQ TABLET    Take 10 mEq by mouth as needed. WITH LASIX    PRAMIPEXOLE  (MIRAPEX ) 1 MG TABLET    TAKE 1 PO BID   PREDNISONE  (DELTASONE ) 20 MG TABLET    Take 3 pills po the day before infusions.   RITUXIMAB (RITUXAN IV)    Inject into the vein.   TELMISARTAN -HYDROCHLOROTHIAZIDE  (MICARDIS  HCT) 80-12.5 MG TABLET    Take 1 tablet by mouth daily.   TIZANIDINE  (ZANAFLEX ) 4 MG TABLET    TAKE 1 TO 1 & 1/2 (ONE & ONE-HALF) TABLETS BY MOUTH THREE TIMES DAILY AS NEEDED   TRAMADOL  (ULTRAM ) 50 MG TABLET    TAKE 1 TABLET BY MOUTH EVERY 8 HOURS  Modified Medications   No medications on file  Discontinued Medications   No medications on file    Allergies Allergies  Allergen Reactions   Codeine Nausea And Vomiting    Can take if takes phernergan   Hydrocodone  Nausea Only   Lamotrigine      "dizziness, shakiness and just feeling not right."   Macrobid  [Nitrofurantoin ] Diarrhea and Nausea And Vomiting   Robaxin  [Methocarbamol ] Rash    Past Medical History Past Medical History:  Diagnosis Date   Adrenal insufficiency (HCC)    Anemia    denies   Anxiety    Basal cell carcinoma    eye lid   Chronic kidney disease, stage 3 (HCC)    Cough    hx. dry cough chronic   Disorder of tear duct system    frequent tearing of eyes   GERD (gastroesophageal reflux disease)    H/O: whooping cough 2000   History of kidney infection 2015   ? sepsis   History of kidney stones    History of kidney stones    Hyperlipidemia    Hypertension    IBS (irritable bowel syndrome)    controlled with med   Osteoarthritis    arthritis -joints-limited ROM shoulders more on right   PONV (postoperative nausea and vomiting)     per pt hx of low BP following shoulder surgery in  05-2015; was managed in hospital with IV fluids and d/c BP being taken a tthe time; pt was dced and F/U with endcrinoopgist who dx with adrenal insurffiency and started steroid therapy; treatmetnt with positive outome, now managed chronically on hydrocortisone  tablets;   Sleep apnea    no longer using cpap   Stroke [facility])    per patient "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 Terril Fetters)   APPENDECTOMY     CARDIAC ELECTROPHYSIOLOGY STUDY AND ABLATION     '93 for WPW syndrome   CARPAL TUNNEL RELEASE Right 12/21/2014   Procedure: RIGHT CARPAL TUNNEL RELEASE ;  Surgeon: Winston Hawking, Connie Haynes;  Location: [facility] OR;  Service: Orthopedics;  Laterality: Right;   CARPAL TUNNEL RELEASE Left    CERVICAL SPINE SURGERY     cervical fusion with hardware retained   COLON SURGERY     "colon detached from abdomen"   COLONOSCOPY WITH PROPOFOL  N/A 05/10/2014   Procedure: COLONOSCOPY WITH PROPOFOL ;  Surgeon: Tami Falcon, Connie Haynes;  Location: [facility];  Service: Endoscopy;  Laterality: N/A;   CYSTOSCOPY/RETROGRADE/URETEROSCOPY Left 12/18/2013   Procedure: CYSTOSCOPY/RETROGRADE/LEFT STENT;  Surgeon: Willye Harvey, Connie Haynes;  Location: [facility];  Service: Urology;  Laterality: Left;   ESOPHAGOGASTRODUODENOSCOPY (EGD) WITH PROPOFOL  N/A 05/10/2014   Procedure: ESOPHAGOGASTRODUODENOSCOPY (EGD) WITH PROPOFOL ;  Surgeon: Tami Falcon, Connie Haynes;  Location: [facility];  Service: Endoscopy;  Laterality: N/A;   EYE SURGERY     EYELID CARCINOMA EXCISION     INSERTION OF DIALYSIS CATHETER N/A 08/16/2019   Procedure:  INSERTION OF DIALYSIS CATHETER;  Surgeon: Prudy Brownie, DO;  Location: [facility];  Service: Pulmonary;  Laterality: N/A;   JOINT REPLACEMENT     KNEE ARTHROSCOPY Left 07/16/2016   REVERSE SHOULDER ARTHROPLASTY Right 06/29/2014   Procedure: REVERSE TOTAL SHOULDER ARTHROPLASTY;  Surgeon: Winston Hawking, Connie Haynes;  Location: [facility] OR;  Service: Orthopedics;  Laterality: Right;   REVISION TOTAL SHOULDER TO REVERSE TOTAL SHOULDER Left 05/15/2015   REVISION TOTAL SHOULDER TO REVERSE TOTAL SHOULDER Left 05/15/2015   Procedure: LEFT SHOULDER REVISION TOTAL SHOULDER ARTHRPLASTY TO REVERSE TOTAL SHOULDER ARTHROPLASTY ;  Surgeon: Winston Hawking, Connie Haynes;  Location: [facility] OR;  Service: Orthopedics;  Laterality: Left;   SPINE SURGERY     tear duct surgery     TOTAL ABDOMINAL HYSTERECTOMY     TOTAL HIP ARTHROPLASTY     x 3(rt x2, lt x1)   TOTAL KNEE ARTHROPLASTY Right 08/08/2013   Procedure: RIGHT TOTAL KNEE ARTHROPLASTY WITH SAPHENOUS NERVE SCAR EXCISION;  Surgeon: Bevin Bucks, Connie Haynes;  Location: [facility];  Service: Orthopedics;  Laterality: Right;   TOTAL KNEE ARTHROPLASTY Left 08/25/2016   Procedure: LEFT TOTAL KNEE ARTHROPLASTY;  Surgeon: Claiborne Crew, Connie Haynes;  Location: [facility];  Service: Orthopedics;  Laterality: Left;  70 mins; Adductor block   TOTAL SHOULDER ARTHROPLASTY Left 07/15/2012   Procedure: LEFT TOTAL SHOULDER ARTHROPLASTY;  Surgeon: Lorriane Rote, Connie Haynes;  Location: [facility] OR;  Service: Orthopedics;  Laterality: Left;   TOTAL SHOULDER REVISION Right 12/21/2014   Procedure: RIGHT SHOULDER POLY EXCHANGE ;  Surgeon: Winston Hawking, Connie Haynes;  Location: [facility] OR;  Service: Orthopedics;  Laterality: Right;   URETEROSCOPY  stent placement    Family History family history includes Cancer in her maternal uncle and paternal grandfather; Heart attack in her maternal grandfather and mother; Heart disease in her maternal grandfather and mother; Heart failure in her mother; Hyperlipidemia in her mother; Hypertension in her mother;  Leukemia in her brother; Lung cancer in her paternal grandfather.  Social History Social History  Socioeconomic History   Marital status: Married    Spouse name: Orlyn   Number of children: 0   Years of education: Not on file   Highest education level: Not on file  Occupational History   Occupation: Retired     Associate Professor: [employer]  Tobacco Use   Smoking status: Never   Smokeless tobacco: Never  Vaping Use   Vaping status: Never Used  Substance and Sexual Activity   Alcohol use: No    Alcohol/week: 0.0 standard drinks of alcohol   Drug use: No   Sexual activity: Not Currently    Partners: Male  Other Topics Concern   Not on file  Social History Narrative   Marital Status:  Married Armed forces operational officer)   Living Situation: Lives with spouse   Occupation: Psychologist, 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   Tobacco Use/Exposure:  None   Alcohol Use: None   Drug Use:  None   Diet:  Low Fat/Low Carb   Exercise:  3-4 Days per week   Hobbies:  Reading, Travel   Left handed    Caffeine: 1 cup coffee every morning, diet coke (rare)   Social Drivers of Health   Financial Resource Strain: Low Risk  (09/01/2022)   Overall Financial Resource Strain (CARDIA)    Difficulty of Paying Living Expenses: Not very hard  Food Insecurity: No Food Insecurity (09/01/2022)   Hunger Vital Sign    Worried About Running Out of Food in the Last Year: Never true    Ran Out of Food in the Last Year: Never true  Transportation Needs: No Transportation Needs (09/01/2022)   PRAPARE - Administrator, Civil Service (Medical): No    Lack of Transportation (Non-Medical): No  Physical Activity: Insufficiently Active (09/01/2022)   Exercise Vital Sign    Days of Exercise per Week: 5 days    Minutes of Exercise per Session: 20 min  Stress: Stress Concern Present (09/01/2022)   [employer] of Occupational Health - Occupational Stress Questionnaire    Feeling of Stress : To some extent   Social Connections: Moderately Isolated (09/01/2022)   Social Connection and Isolation Panel [NHANES]    Frequency of Communication with Friends and Family: Twice a week    Frequency of Social Gatherings with Friends and Family: Twice a week    Attends Religious Services: Never    Database administrator or Organizations: No    Attends Banker Meetings: Never    Marital Status: Married  Catering manager Violence: Not At Risk (09/02/2022)   Humiliation, Afraid, Rape, and Kick questionnaire    Fear of Current or Ex-Partner: No    Emotionally Abused: No    Physically Abused: No    Sexually Abused: No    Lab Results  Component Value Date   CHOL 79 08/17/2019   Lab Results  Component Value Date   HDL 22 (L) 08/17/2019   Lab Results  Component Value Date   LDLCALC 29 08/17/2019   Lab Results  Component Value Date   TRIG 141 08/17/2019   Lab Results  Component Value Date   CHOLHDL 3.6 08/17/2019   Lab Results  Component Value Date   CREATININE 0.89 05/25/2023   Lab Results  Component Value Date   GFR 64.55 06/04/2022      Component Value Date/Time   NA 139 05/25/2023 1322   NA 139 12/04/2022 0908   K 3.6 05/25/2023 1322   CL 105  05/25/2023 1322   CO2 23 05/25/2023 1322   GLUCOSE 97 05/25/2023 1322   BUN 20 05/25/2023 1322   BUN 16 12/04/2022 0908   CREATININE 0.89 05/25/2023 1322   CREATININE 0.87 06/18/2015 1207   CALCIUM  9.5 05/25/2023 1322   PROT 6.7 05/25/2023 1322   ALBUMIN  4.2 05/25/2023 1322   ALBUMIN  4.0 08/10/2019 2356   AST 17 05/25/2023 1322   ALT 13 05/25/2023 1322   ALKPHOS 67 05/25/2023 1322   BILITOT 0.7 05/25/2023 1322   GFRNONAA >60 05/25/2023 1322   GFRAA >60 08/24/2019 0417      Latest Ref Rng & Units 05/25/2023    1:22 PM 12/04/2022    9:08 AM 08/06/2022    9:43 AM  BMP  Glucose 70 - 99 mg/dL 97  98  99   BUN 8 - 23 mg/dL 20  16  19    Creatinine 0.44 - 1.00 mg/dL 5.62  1.30  8.65   BUN/Creat Ratio 12 - 28  17  20     Sodium 135 - 145 mmol/L 139  139  143   Potassium 3.5 - 5.1 mmol/L 3.6  3.9  3.8   Chloride 98 - 111 mmol/L 105  101  104   CO2 22 - 32 mmol/L 23  23  22    Calcium  8.9 - 10.3 mg/dL 9.5  9.4  9.4        Component Value Date/Time   WBC 7.2 05/25/2023 1322   RBC 4.47 05/25/2023 1322   HGB 13.7 05/25/2023 1322   HGB 12.4 04/05/2023 1408   HCT 42.7 05/25/2023 1322   HCT 38.1 04/05/2023 1408   PLT 268 05/25/2023 1322   PLT 252 04/05/2023 1408   MCV 95.5 05/25/2023 1322   MCV 92 04/05/2023 1408   MCH 30.6 05/25/2023 1322   MCHC 32.1 05/25/2023 1322   RDW 13.0 05/25/2023 1322   RDW 12.7 04/05/2023 1408   LYMPHSABS 1.3 05/25/2023 1322   LYMPHSABS 1.4 04/05/2023 1408   MONOABS 0.4 05/25/2023 1322   EOSABS 0.2 05/25/2023 1322   EOSABS 0.3 04/05/2023 1408   BASOSABS 0.1 05/25/2023 1322   BASOSABS 0.1 04/05/2023 1408   Lab Results  Component Value Date   TSH 3.119 08/11/2019   TSH 1.489 05/23/2012         Parts of this note may have been dictated using voice recognition software. There may be variances in spelling and vocabulary which are unintentional. Not all errors are proofread. Please notify the [facility] if any discrepancies are noted or if the meaning of any statement is not clear.

## 2023-06-02 ENCOUNTER — Other Ambulatory Visit: Payer: Self-pay | Admitting: Urology

## 2023-06-02 DIAGNOSIS — N390 Urinary tract infection, site not specified: Secondary | ICD-10-CM

## 2023-06-02 DIAGNOSIS — I493 Ventricular premature depolarization: Secondary | ICD-10-CM

## 2023-06-02 MED ORDER — FOSFOMYCIN TROMETHAMINE 3 G PO PACK: PACK | ORAL | 3 refills | Status: DC

## 2023-06-04 ENCOUNTER — Other Ambulatory Visit: Payer: Self-pay | Admitting: Family

## 2023-06-07 ENCOUNTER — Other Ambulatory Visit: Payer: Self-pay | Admitting: Licensed Clinical Social Worker

## 2023-06-07 NOTE — Patient Outreach (Signed)
 Complex Care Management   Visit Note  06/07/2023  Name:  Connie Haynes MRN: [id number] DOB: 12/26/1955  Situation: Referral received for Complex Care Management related to  emmi call   I obtained verbal consent from Patient.  Visit completed with D Sant   on the phone. Pt reports she is managing well at home with spouse and does not have any medical/sdoh or mental health needs.   Background:   Past Medical History:  Diagnosis Date   Adrenal insufficiency (HCC)    Anemia    denies   Anxiety    Basal cell carcinoma    eye lid   Chronic kidney disease, stage 3 (HCC)    Cough    hx. dry cough chronic   Disorder of tear duct system    frequent tearing of eyes   GERD (gastroesophageal reflux disease)    H/O: whooping cough 2000   History of kidney infection 2015   ? sepsis   History of kidney stones    History of kidney stones    Hyperlipidemia    Hypertension    IBS (irritable bowel syndrome)    controlled with med   Osteoarthritis    arthritis -joints-limited ROM shoulders more on right   PONV (postoperative nausea and vomiting)     per pt hx of low BP following shoulder surgery in 05-2015; was managed in hospital with IV fluids and d/c BP being taken a tthe time; pt was dced and F/U with endcrinoopgist who dx with adrenal insurffiency and started steroid therapy; treatmetnt with positive outome, now managed chronically on hydrocortisone  tablets;   Sleep apnea    no longer using cpap   Stroke [facility])    per patient "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 that was because of 3 compressed vertebrae  that were causing LLE tremors and weakneess; underwent physcal thereapy with return to normal ADLs with exception of occasional tremors in right hand    UTI (lower urinary tract infection)    Wolff-Parkinson-White (WPW) syndrome 1990s   had ablation done  and now managed on carvedilol      Assessment: Patient Reported Symptoms:  Cognitive        Neurological      HEENT        Cardiovascular       Respiratory      Endocrine      Gastrointestinal        Genitourinary      Integumentary      Musculoskeletal          Psychosocial Psychosocial Symptoms Reported: No symptoms reported     Do you feel physically threatened by others?: No      06/07/2023    5:31 PM  Depression screen PHQ 2/9  Decreased Interest 1  Down, Depressed, Hopeless 0  PHQ - 2 Score 1    There were no vitals filed for this visit.  Medications Reviewed Today   Medications were not reviewed in this encounter     Recommendation:   Pt is follow up in pcp regarding MRI and does not have further recommendations.   Follow Up Plan:   Closing From:  Complex Care Management  Fletcher Humble MSW, LCSW Licensed Clinical Social Worker  [facility], Population Health Direct Dial: [phone]  Fax: [phone]

## 2023-06-07 NOTE — Patient Instructions (Signed)
 Visit Information  Thank you for taking time to visit with me today. Please don't hesitate to contact me if I can be of assistance to you before our next scheduled appointment.     Closing From: Complex Care Management.  Please call the care guide team at [phone] if you need to cancel, schedule, or reschedule an appointment.   Please call 911 if you are experiencing a Mental Health or Behavioral Health Crisis or need someone to talk to.  Gwyndolyn Saxon MSW, LCSW Licensed Clinical Social Worker  [facility], Population Health Direct Dial: [phone]  Fax: [phone]

## 2023-06-25 ENCOUNTER — Other Ambulatory Visit

## 2023-06-27 ENCOUNTER — Other Ambulatory Visit

## 2023-06-29 DIAGNOSIS — E559 Vitamin D deficiency, unspecified: Secondary | ICD-10-CM | POA: Diagnosis not present

## 2023-06-29 DIAGNOSIS — M810 Age-related osteoporosis without current pathological fracture: Secondary | ICD-10-CM | POA: Diagnosis not present

## 2023-06-29 DIAGNOSIS — E273 Drug-induced adrenocortical insufficiency: Secondary | ICD-10-CM | POA: Diagnosis not present

## 2023-06-29 DIAGNOSIS — T380X5A Adverse effect of glucocorticoids and synthetic analogues, initial encounter: Secondary | ICD-10-CM | POA: Diagnosis not present

## 2023-06-30 ENCOUNTER — Ambulatory Visit: Admitting: "Endocrinology

## 2023-07-06 NOTE — Progress Notes (Deleted)
 Cardiology Office Note Date:  07/06/2023  Patient ID:  Connie Haynes, Connie Haynes 09/05/1955, MRN [id number] PCP:  Adra Alanis, FNP  Cardiologist:  Dr. Rodolfo Clan    Chief Complaint:  *** 3 mo f/u   History of Present Illness: Connie Haynes is a 68 y.o. female with history of WPW (s/p ablation 1993), HTN, HLD, IBS, OA, Addison's  disease/adrenal insufficiency follows with Dr. Felipe Horton, transverse myelitis, neuromyelitis  (endocrinologist at [facility], also managing steroids peri-op).     She saw Dr. Rodolfo Clan 07/03/21, c/o CP, discussed palpitations historically to be PACs Monitor noted brief AFib less then a minuite, planned for monitoring via her wearable tech, to notify if elevated HRs of hours in duration. Edema treated with lasix   She saw Jonelle Neri 08/06/22, palpitations were improved as was her edema. Reported symptoms with low BPs at home.  Discussed medication adjustments, though she was hesitant to make any and planned to f/u with her PMD.  Pending R shoulder surgery, not yet scheduled Telephone visit with pre-op APP she reportesd palpitations/rapid HRs perhaps a minute in duration, planned for EP follow up and comment on her pre-op evaluation as well.  RCRI is zero (0.4%)  I saw her  She reports hx of several b/l shoulder surgeries/replacements, her R shoulder now having spontaneous dislocations and they are discussing another surgery for this.  Though her orthopedic team not as enthusiastic about doing surgery with her comorbid conditions  -----She reports a few months of more then usual DOE She walks as often and as regularly as she can and in the last few months notes more winded, more difficult to complete/go as far. ------New or different palpitations Different the the PVCs, more reminiscent of her WPW tachycardia though there are very brief, lasting only seconds where her WPW tachycardia lasted a really long time. These started up a couple months ago as well Notes her HRs via watch  anywhere from 40-194 She is very aware of the rapid palpitations, they hit her suddenly, feel pressure in her chest/head with them and reflexively causes her to gasp/take in a deep breath They leave feeling tired Not associated with dizziness/near syncope ------Low BPs' these do seem associate with when she has a UTI, SBPs in the 80's and she does feel very weak/lightheaded with them This has been discussed before and has been advised to hold her coreg  days her BP is low. She regularly gets her PM coreg  in, morning does is on/off depending on where her BP starts Her palpitations are not associated with skipped coreg  days   She has not  pattern to this, can go days maybe weeks without then it happens, not positional, exertional,  ----- She gets her infusion tx every 60mo, she can always tell when she is getting close, her neuropathy with start up, legs will get numb and very weak >> following he infusion for a couple weeks feels awful from the medication but then has a few months of "her normal, more functional" DOE and palps do not seem connected to infusion timing  Planned for monitor and echo She was uncertain on plans for surgery  MONITOR not fpormally read yet My review SR, PACs Rare PAT 6.8 sec  NSVT longest 8 beats  Symptoms with/without ectopy  TTE w/LVEF 60-65%, no WMA, RV OK, mild AI  I saw her Dec 2024 They have decided she is not a candiate for shoulder surgery given her multiple clinical conditions. She feels about the same. Palpitations  are worrisome, but not particularly symptomatic, generally brief/few seconds only. No near syncope or syncope DOE remains the same, also reports today some heaviness in her chest/back with it, all of the the same now for quite a while Does not seem to be changing/escalating, and does say goes back to the time of her stress test last year. No sustained arrhythmias, nothing not previously seen Echo reassuring, DOE suspect to be  multifactorial Planned to f/u in a few months  She saw Dr. Rodolfo Clan 04/09/23,  Reported widely fluctuating HRs,  She has understood this from her endocrinologist to be an acute mini adrenal crisis and in response takes extra Solu-Cortef .  These events can also occur in other situations a low-grade infections but are invariably associated with her tachycardia in the same timeframe as noted With her transverse myelitis, her walking is very limited.  No chest pain.  Chronic dyspnea.  Intermittent edema for which she takes diuretics  --- palpitations associated with rapid heart rates. These were associated with postevent hypotension  -- if we could avoid this, by suppressing her tachycardia, this would be a good thing as it might reduce the risk of falls given the blood pressures in the 70s and 80s  Flecainide  added  TODAY  *** flecainide , nodal blocker, EKG *** ETT??? *** symptoms    Past Medical History:  Diagnosis Date   Adrenal insufficiency (HCC)    Anemia    denies   Anxiety    Basal cell carcinoma    eye lid   Chronic kidney disease, stage 3 (HCC)    Cough    hx. dry cough chronic   Disorder of tear duct system    frequent tearing of eyes   GERD (gastroesophageal reflux disease)    H/O: whooping cough 2000   History of kidney infection 2015   ? sepsis   History of kidney stones    History of kidney stones    Hyperlipidemia    Hypertension    IBS (irritable bowel syndrome)    controlled with med   Osteoarthritis    arthritis -joints-limited ROM shoulders more on right   PONV (postoperative nausea and vomiting)     per pt hx of low BP following shoulder surgery in 05-2015; was managed in hospital with IV fluids and d/c BP being taken a tthe time; pt was dced and F/U with endcrinoopgist who dx with adrenal insurffiency and started steroid therapy; treatmetnt with positive outome, now managed chronically on hydrocortisone  tablets;   Sleep apnea    no longer using cpap    Stroke [facility])    per patient "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 Terril Fetters)   APPENDECTOMY     CARDIAC ELECTROPHYSIOLOGY STUDY AND ABLATION     '93 for WPW syndrome   CARPAL TUNNEL RELEASE Right 12/21/2014   Procedure: RIGHT CARPAL TUNNEL RELEASE ;  Surgeon: Winston Hawking, MD;  Location: [facility] OR;  Service: Orthopedics;  Laterality: Right;   CARPAL TUNNEL RELEASE Left    CERVICAL SPINE SURGERY  cervical fusion with hardware retained   COLON SURGERY     "colon detached from abdomen"   COLONOSCOPY WITH PROPOFOL  N/A 05/10/2014   Procedure: COLONOSCOPY WITH PROPOFOL ;  Surgeon: Tami Falcon, MD;  Location: [facility] ENDOSCOPY;  Service: Endoscopy;  Laterality: N/A;   CYSTOSCOPY/RETROGRADE/URETEROSCOPY Left 12/18/2013   Procedure: CYSTOSCOPY/RETROGRADE/LEFT STENT;  Surgeon: Willye Harvey, MD;  Location: [facility] ORS;  Service: Urology;  Laterality: Left;   ESOPHAGOGASTRODUODENOSCOPY (EGD) WITH PROPOFOL  N/A 05/10/2014   Procedure: ESOPHAGOGASTRODUODENOSCOPY (EGD) WITH PROPOFOL ;  Surgeon: Tami Falcon, MD;  Location: [facility] END[facility];  Service: Endoscopy;  Laterality: N/A;   EYE SURGERY     EYELID CARCINOMA EXCISION     INSERTION OF DIALYSIS CATHETER N/A 08/16/2019   Procedure: INSERTION OF DIALYSIS CATHETER;  Surgeon: Prudy Brownie, DO;  Location: [facility] ENDOSCOPY;  Service: Pulmonary;  Laterality: N/A;   JOINT REPLACEMENT     KNEE ARTHROSCOPY Left 07/16/2016   REVERSE SHOULDER ARTHROPLASTY Right 06/29/2014   Procedure: REVERSE TOTAL SHOULDER ARTHROPLASTY;  Surgeon: Winston Hawking, MD;  Location: [facility] OR;   Service: Orthopedics;  Laterality: Right;   REVISION TOTAL SHOULDER TO REVERSE TOTAL SHOULDER Left 05/15/2015   REVISION TOTAL SHOULDER TO REVERSE TOTAL SHOULDER Left 05/15/2015   Procedure: LEFT SHOULDER REVISION TOTAL SHOULDER ARTHRPLASTY TO REVERSE TOTAL SHOULDER ARTHROPLASTY ;  Surgeon: Winston Hawking, MD;  Location: [facility] OR;  Service: Orthopedics;  Laterality: Left;   SPINE SURGERY     tear duct surgery     TOTAL ABDOMINAL HYSTERECTOMY     TOTAL HIP ARTHROPLASTY     x 3(rt x2, lt x1)   TOTAL KNEE ARTHROPLASTY Right 08/08/2013   Procedure: RIGHT TOTAL KNEE ARTHROPLASTY WITH SAPHENOUS NERVE SCAR EXCISION;  Surgeon: Bevin Bucks, MD;  Location: [facility];  Service: Orthopedics;  Laterality: Right;   TOTAL KNEE ARTHROPLASTY Left 08/25/2016   Procedure: LEFT TOTAL KNEE ARTHROPLASTY;  Surgeon: Claiborne Crew, MD;  Location: [facility];  Service: Orthopedics;  Laterality: Left;  70 mins; Adductor block   TOTAL SHOULDER ARTHROPLASTY Left 07/15/2012   Procedure: LEFT TOTAL SHOULDER ARTHROPLASTY;  Surgeon: Lorriane Rote, MD;  Location: [facility] OR;  Service: Orthopedics;  Laterality: Left;   TOTAL SHOULDER REVISION Right 12/21/2014   Procedure: RIGHT SHOULDER POLY EXCHANGE ;  Surgeon: Winston Hawking, MD;  Location: [facility] OR;  Service: Orthopedics;  Laterality: Right;   URETEROSCOPY  stent placement    Current Outpatient Medications  Medication Sig Dispense Refill   carvedilol  (COREG ) 25 MG tablet Take 1 tablet (25 mg total) by mouth 2 (two) times daily. 180 tablet 3   citalopram  (CELEXA ) 40 MG tablet Take 1 tablet by mouth once daily 90 tablet 0   flecainide  (TAMBOCOR ) 50 MG tablet Take 1.5 tablets (75 mg total) by mouth 2 (two) times daily. 270 tablet 1   fosfomycin (MONUROL ) 3 g PACK Take 3 g by mouth every 10 days 9 g 3   furosemide  (LASIX ) 40 MG tablet TAKE 2 TABLETS BY MOUTH EVERY OTHER DAY 90 tablet 3   hydrocortisone  (CORTEF ) 10 MG tablet Take 5-10 mg by mouth See admin instructions. Take 10mg  in the AM  and 5mg  in the afternoon.     lacosamide  (VIMPAT ) 50 MG TABS tablet Take 1 tablet (50 mg total) by mouth 2 (two) times daily. 60 tablet 5   LINZESS  72 MCG capsule Take 1 capsule by mouth once daily 90 capsule 0   LORazepam  (ATIVAN ) 0.5 MG tablet TAKE 1 TABLET BY MOUTH TWICE DAILY AS NEEDED 60  tablet 5   Multiple Vitamin (MULTIVITAMIN ADULT PO) Take 1 tablet by mouth daily.     ondansetron  (ZOFRAN ) 4 MG tablet Take 4 mg by mouth every 8 (eight) hours as needed.     potassium chloride  (KLOR-CON ) 10 MEQ tablet Take 10 mEq by mouth as needed. WITH LASIX      pramipexole  (MIRAPEX ) 1 MG tablet TAKE 1 PO BID 180 tablet 3   predniSONE  (DELTASONE ) 20 MG tablet Take 3 pills po the day before infusions. 12 tablet 0   riTUXimab (RITUXAN IV) Inject into the vein.     telmisartan -hydrochlorothiazide  (MICARDIS  HCT) 80-12.5 MG tablet Take 1 tablet by mouth once daily 90 tablet 0   tiZANidine  (ZANAFLEX ) 4 MG tablet TAKE 1 TO 1 & 1/2 (ONE & ONE-HALF) TABLETS BY MOUTH THREE TIMES DAILY AS NEEDED 270 tablet 2   traMADol  (ULTRAM ) 50 MG tablet TAKE 1 TABLET BY MOUTH EVERY 8 HOURS 90 tablet 0   No current facility-administered medications for this visit.   Facility-Administered Medications Ordered in Other Visits  Medication Dose Route Frequency Provider Last Rate Last Admin   chlorhexidine  (HIBICLENS ) 4 % liquid 4 application  60 mL Topical Once Dixon, Lovell Rubenstein, PA-C       technetium tetrofosmin  (TC-MYOVIEW ) injection 31.7 milli Curie  31.7 millicurie Intravenous Once PRN Nelson, Katarina H, MD        Allergies:   Codeine, Hydrocodone , Lamotrigine , Macrobid  [nitrofurantoin ], and Robaxin  [methocarbamol ]   Social History:  The patient  reports that she has never smoked. She has never used smokeless tobacco. She reports that she does not drink alcohol and does not use drugs.   Family History:  The patient's family history includes Cancer in her maternal uncle and paternal grandfather; Heart attack in her  maternal grandfather and mother; Heart disease in her maternal grandfather and mother; Heart failure in her mother; Hyperlipidemia in her mother; Hypertension in her mother; Leukemia in her brother; Lung cancer in her paternal grandfather.  ROS:  Please see the history of present illness.    All other systems are reviewed and otherwise negative.   PHYSICAL EXAM:  VS:  There were no vitals taken for this visit. BMI: There is no height or weight on file to calculate BMI. Well nourished, well developed, in no acute distress  HEENT: normocephalic, atraumatic  Neck: no JVD, carotid bruits or masses Cardiac: *** RRR; no significant murmurs, no rubs, or gallops Lungs: *** CTA b/l, no wheezing, rhonchi or rales  Abd: soft, nontender MS: no deformity or atrophy Ext: *** no pitting edema  Skin: warm and dry, no rash Neuro:  No gross deficits appreciated Psych: euthymic mood, full affect   EKG:  done today and reviewed by myself ***  07/24/21: stress myoview   The study is normal. The study is low risk.   No ST deviation was noted.   LV perfusion is normal. There is no evidence of ischemia. There is no evidence of infarction.   Left ventricular function is normal. Nuclear stress EF: 69 %. The left ventricular ejection fraction is hyperdynamic (>65%). End diastolic cavity size is normal.   Prior study available for comparison from 08/22/2015.   No significant changes from previous study from August 22, 2015   11/22/20: TTE  1. Left ventricular ejection fraction, by estimation, is 60 to 65%. Left  ventricular ejection fraction by 3D volume is 65 %. The left ventricle has  normal function. The left ventricle has no regional wall motion  abnormalities. Left  ventricular diastolic   parameters were normal. The average left ventricular global longitudinal  strain is -25.6 %. The global longitudinal strain is normal.   2. Right ventricular systolic function is normal. The right ventricular  size is  normal. There is mildly elevated pulmonary artery systolic  pressure. The estimated right ventricular systolic pressure is 40.7 mmHg.   3. The mitral valve is normal in structure. No evidence of mitral valve  regurgitation.   4. The aortic valve is tricuspid. Aortic valve regurgitation is mild. No  aortic stenosis is present.   5. The inferior vena cava is dilated in size with >50% respiratory  variability, suggesting right atrial pressure of 8 mmHg.   Comparison(s): No prior Echocardiogram.    Sept 2022: monitor Patient had a min HR of 49 bpm, max HR of 184 bpm, and avg HR of 68 bpm. Predominant underlying rhythm was Sinus Rhythm. 2 Ventricular Tachycardia runs occurred, the run with the fastest interval lasting 5 beats with a max rate of 174 bpm, the longest  lasting 5 beats with an avg rate of 163 bpm. 46 Supraventricular Tachycardia runs occurred, the run with the fastest interval lasting 5 beats with a max rate of 184 bpm, the longest lasting 27.7 secs with an avg rate of 106 bpm. Isolated SVEs were rare  (<1.0%), SVE Couplets were rare (<1.0%), and SVE Triplets were rare (<1.0%). Isolated VEs were rare (<1.0%), and no VE Couplets or VE Triplets were present.   08/22/15: Lexiscan  stress test Nuclear stress EF: 76%. There was no ST segment deviation noted during stress. This is a low risk study. Basal septal wall decreased radiotracer uptake seen at both rest and stress, no ischemia identified.  Recent Labs: 12/04/2022: Magnesium  2.1 05/25/2023: ALT 13; BUN 20; Creatinine, Ser 0.89; Hemoglobin 13.7; Platelets 268; Potassium 3.6; Sodium 139  No results found for requested labs within last 365 days.   CrCl cannot be calculated (Patient's most recent lab result is older than the maximum 21 days allowed.).   Wt Readings from Last 3 Encounters:  06/01/23 297 lb (134.7 kg)  05/25/23 296 lb (134.3 kg)  05/24/23 296 lb (134.3 kg)     Other studies reviewed: Additional studies/records  reviewed today include: summarized above  ASSESSMENT AND PLAN:  1. HTN     *** Looks good today, no changes      2. WPW, ablated    ***  no pre-excitation is appreciated on EKG  Palpitations monitor Oct 2024 Very brief PATs PVCs, NSVT not new for her ***   Flecainide /*** w/***intervals ***       3. Adrenal insufficiency     Follows with endocriology     Chronic steroids  4. *** DOE     Suspect multifactorial     neg stress myoview  last yer     Echo reassuring     Disposition: back in *** mo, sooner if needed    Current medicines are reviewed at length with the patient today.  The patient did not have any concerns regarding medicines.  Tito Formica, PA-C 07/06/2023 7:55 AM     [facility] [address] [place] [address] [phone] (office)  [phone] (fax)

## 2023-07-07 ENCOUNTER — Ambulatory Visit: Payer: Medicare Other | Admitting: Physician Assistant

## 2023-07-18 NOTE — Progress Notes (Unsigned)
 Cardiology Office Note Date:  07/18/2023  Patient ID:  Connie, Haynes [birth year], MRN [id number] PCP:  Adra Alanis, FNP  Cardiologist:  Dr. Rodolfo Clan    Chief Complaint:  3 mo f/u   History of Present Illness: Connie Haynes is a 68 y.o. female with history of  WPW (s/p ablation 1993),  HTN, HLD, IBS, OA,  Addison's  disease/adrenal insufficiency follows with Dr. Felipe Horton,  transverse myelitis, neuromyelitis (endocrinologist at [facility]).    PACs/PAT   She saw Dr. Rodolfo Clan 07/03/21, c/o CP, discussed palpitations historically to be PACs Monitor noted brief AFib less then a minuite, planned for monitoring via her wearable tech, to notify if elevated HRs of hours in duration. Edema treated with lasix   She saw Jonelle Neri 08/06/22, palpitations were improved as was her edema. Reported symptoms with low BPs at home.  Discussed medication adjustments, though she was hesitant to make any and planned to f/u with her PMD.  Pending R shoulder surgery, not yet scheduled Telephone visit with pre-op APP she reportesd palpitations/rapid HRs perhaps a minute in duration, planned for EP follow up and comment on her pre-op evaluation as well.  RCRI is zero (0.4%)  I saw her  She reports hx of several b/l shoulder surgeries/replacements, her R shoulder now having spontaneous dislocations and they are discussing another surgery for this.  Though her orthopedic team not as enthusiastic about doing surgery with her comorbid conditions  -----She reports a few months of more then usual DOE She walks as often and as regularly as she can and in the last few months notes more winded, more difficult to complete/go as far. ------New or different palpitations Different the the PVCs, more reminiscent of her WPW tachycardia though there are very brief, lasting only seconds where her WPW tachycardia lasted a really long time. These started up a couple months ago as well Notes her HRs via watch anywhere from  40-194 She is very aware of the rapid palpitations, they hit her suddenly, feel pressure in her chest/head with them and reflexively causes her to gasp/take in a deep breath They leave feeling tired Not associated with dizziness/near syncope ------Low BPs' these do seem associate with when she has a UTI, SBPs in the 80's and she does feel very weak/lightheaded with them This has been discussed before and has been advised to hold her coreg  days her BP is low. She regularly gets her PM coreg  in, morning does is on/off depending on where her BP starts Her palpitations are not associated with skipped coreg  days   She has not  pattern to this, can go days maybe weeks without then it happens, not positional, exertional,  ----- She gets her infusion tx every 67mo, she can always tell when she is getting close, her neuropathy with start up, legs will get numb and very weak >> following he infusion for a couple weeks feels awful from the medication but then has a few months of her normal, more functional DOE and palps do not seem connected to infusion timing  Planned for monitor and echo She was uncertain on plans for surgery  MONITOR not formally read yet My review SR, PACs Rare PAT 6.8 sec  NSVT longest 8 beats  Symptoms with/without ectopy  TTE w/LVEF 60-65%, no WMA, RV OK, mild AI  I saw her Dec 2024 They have decided she is not a candiate for shoulder surgery given her multiple clinical conditions. She feels about the same. Palpitations  are worrisome, but not particularly symptomatic, generally brief/few seconds only. No near syncope or syncope DOE remains the same, also reports today some heaviness in her chest/back with it, all of the the same now for quite a while Does not seem to be changing/escalating, and does say goes back to the time of her stress test last year. No sustained arrhythmias, nothing not previously seen Echo reassuring, DOE suspect to be multifactorial Planned to  f/u in a few months  She saw Dr. Rodolfo Clan 04/09/23,  Reported widely fluctuating HRs,  She has understood this from her endocrinologist to be an acute mini adrenal crisis and in response takes extra Solu-Cortef .  These events can also occur in other situations a low-grade infections but are invariably associated with her tachycardia in the same timeframe as noted With her transverse myelitis, her walking is very limited.  No chest pain.  Chronic dyspnea.  Intermittent edema for which she takes diuretics  --- palpitations associated with rapid heart rates. These were associated with post event hypotension  -- if we could avoid this, by suppressing her tachycardia, this would be a good thing as it might reduce the risk of falls given the blood pressures in the 70s and 80s  Flecainide  added  TODAY  She felt extremely tired on Flecainide  and stopped it about 1.5weeks ago with improvement back to her baseline The fatigue was far worse then her palpitations are for her Comments that she feels poorly/tired enough with her other medical diagnosis' the medicaine seemed to compound that significantly  She has been assured that her PACs/rapid rates are not dangerous, and given that she prefers to manage this conservatively at least at this time  No other cardiac complaints/concerns today   Past Medical History:  Diagnosis Date   Adrenal insufficiency (HCC)    Anemia    denies   Anxiety    Basal cell carcinoma    eye lid   Chronic kidney disease, stage 3 (HCC)    Cough    hx. dry cough chronic   Disorder of tear duct system    frequent tearing of eyes   GERD (gastroesophageal reflux disease)    H/O: whooping cough 2000   History of kidney infection 2015   ? sepsis   History of kidney stones    History of kidney stones    Hyperlipidemia    Hypertension    IBS (irritable bowel syndrome)    controlled with med   Osteoarthritis    arthritis -joints-limited ROM shoulders more on right   PONV  (postoperative nausea and vomiting)     per pt hx of low BP following shoulder surgery in 05-2015; was managed in hospital with IV fluids and d/c BP being taken a tthe time; pt was dced and F/U with endcrinoopgist who dx with adrenal insurffiency and started steroid therapy; treatmetnt with positive outome, now managed chronically on hydrocortisone  tablets;   Sleep apnea    no longer using cpap   Stroke (HCC)    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 Terril Fetters)   APPENDECTOMY     CARDIAC ELECTROPHYSIOLOGY STUDY AND ABLATION     '93 for WPW syndrome   CARPAL TUNNEL RELEASE Right 12/21/2014   Procedure: RIGHT CARPAL TUNNEL RELEASE ;  Surgeon: Winston Hawking, MD;  Location: [facility] OR;  Service: Orthopedics;  Laterality: Right;   CARPAL TUNNEL RELEASE Left    CERVICAL SPINE SURGERY     cervical fusion with hardware retained   COLON SURGERY     colon detached from abdomen   COLONOSCOPY WITH PROPOFOL  N/A 05/10/2014   Procedure: COLONOSCOPY WITH PROPOFOL ;  Surgeon: Tami Falcon, MD;  Location: [facility];  Service: Endoscopy;  Laterality: N/A;   CYSTOSCOPY/RETROGRADE/URETEROSCOPY Left 12/18/2013   Procedure: CYSTOSCOPY/RETROGRADE/LEFT STENT;  Surgeon: Willye Harvey, MD;  Location: [facility] ORS;  Service: Urology;  Laterality: Left;   ESOPHAGOGASTRODUODENOSCOPY (EGD) WITH PROPOFOL  N/A 05/10/2014   Procedure: ESOPHAGOGASTRODUODENOSCOPY (EGD) WITH PROPOFOL ;  Surgeon: Tami Falcon, MD;  Location: [facility];  Service: Endoscopy;  Laterality: N/A;   EYE SURGERY     EYELID CARCINOMA EXCISION      INSERTION OF DIALYSIS CATHETER N/A 08/16/2019   Procedure: INSERTION OF DIALYSIS CATHETER;  Surgeon: Prudy Brownie, DO;  Location: [facility];  Service: Pulmonary;  Laterality: N/A;   JOINT REPLACEMENT     KNEE ARTHROSCOPY Left 07/16/2016   REVERSE SHOULDER ARTHROPLASTY Right 06/29/2014   Procedure: REVERSE TOTAL SHOULDER ARTHROPLASTY;  Surgeon: Winston Hawking, MD;  Location: [facility] OR;  Service: Orthopedics;  Laterality: Right;   REVISION TOTAL SHOULDER TO REVERSE TOTAL SHOULDER Left 05/15/2015   REVISION TOTAL SHOULDER TO REVERSE TOTAL SHOULDER Left 05/15/2015   Procedure: LEFT SHOULDER REVISION TOTAL SHOULDER ARTHRPLASTY TO REVERSE TOTAL SHOULDER ARTHROPLASTY ;  Surgeon: Winston Hawking, MD;  Location: [facility] OR;  Service: Orthopedics;  Laterality: Left;   SPINE SURGERY     tear duct surgery     TOTAL ABDOMINAL HYSTERECTOMY     TOTAL HIP ARTHROPLASTY     x 3(rt x2, lt x1)   TOTAL KNEE ARTHROPLASTY Right 08/08/2013   Procedure: RIGHT TOTAL KNEE ARTHROPLASTY WITH SAPHENOUS NERVE SCAR EXCISION;  Surgeon: Bevin Bucks, MD;  Location: [facility];  Service: Orthopedics;  Laterality: Right;   TOTAL KNEE ARTHROPLASTY Left 08/25/2016   Procedure: LEFT TOTAL KNEE ARTHROPLASTY;  Surgeon: Claiborne Crew, MD;  Location: [facility];  Service: Orthopedics;  Laterality: Left;  70 mins; Adductor block   TOTAL SHOULDER ARTHROPLASTY Left 07/15/2012   Procedure: LEFT TOTAL SHOULDER ARTHROPLASTY;  Surgeon: Lorriane Rote, MD;  Location: [facility] OR;  Service: Orthopedics;  Laterality: Left;   TOTAL SHOULDER REVISION Right 12/21/2014   Procedure: RIGHT SHOULDER POLY EXCHANGE ;  Surgeon: Winston Hawking, MD;  Location: [facility] OR;  Service: Orthopedics;  Laterality: Right;   URETEROSCOPY  stent placement    Current Outpatient Medications  Medication Sig Dispense Refill   carvedilol  (COREG ) 25 MG tablet Take 1 tablet (25 mg total) by mouth 2 (two) times daily. 180 tablet 3   citalopram  (CELEXA ) 40 MG tablet Take 1 tablet by mouth  once daily 90 tablet 0   flecainide  (TAMBOCOR ) 50 MG tablet Take 1.5 tablets (75 mg total) by mouth 2 (two) times daily. 270 tablet 1   fosfomycin (MONUROL ) 3 g PACK Take 3 g by mouth every 10 days 9 g 3   furosemide  (LASIX ) 40 MG tablet TAKE 2 TABLETS BY MOUTH EVERY OTHER DAY 90 tablet 3   hydrocortisone  (CORTEF ) 10 MG tablet Take 5-10 mg by mouth See  admin instructions. Take 10mg  in the AM and 5mg  in the afternoon.     lacosamide  (VIMPAT ) 50 MG TABS tablet Take 1 tablet (50 mg total) by mouth 2 (two) times daily. 60 tablet 5   LINZESS  72 MCG capsule Take 1 capsule by mouth once daily 90 capsule 0   LORazepam  (ATIVAN ) 0.5 MG tablet TAKE 1 TABLET BY MOUTH TWICE DAILY AS NEEDED 60 tablet 5   Multiple Vitamin (MULTIVITAMIN ADULT PO) Take 1 tablet by mouth daily.     ondansetron  (ZOFRAN ) 4 MG tablet Take 4 mg by mouth every 8 (eight) hours as needed.     potassium chloride  (KLOR-CON ) 10 MEQ tablet Take 10 mEq by mouth as needed. WITH LASIX      pramipexole  (MIRAPEX ) 1 MG tablet TAKE 1 PO BID 180 tablet 3   predniSONE  (DELTASONE ) 20 MG tablet Take 3 pills po the day before infusions. 12 tablet 0   riTUXimab (RITUXAN IV) Inject into the vein.     telmisartan -hydrochlorothiazide  (MICARDIS  HCT) 80-12.5 MG tablet Take 1 tablet by mouth once daily 90 tablet 0   tiZANidine  (ZANAFLEX ) 4 MG tablet TAKE 1 TO 1 & 1/2 (ONE & ONE-HALF) TABLETS BY MOUTH THREE TIMES DAILY AS NEEDED 270 tablet 2   traMADol  (ULTRAM ) 50 MG tablet TAKE 1 TABLET BY MOUTH EVERY 8 HOURS 90 tablet 0   No current facility-administered medications for this visit.   Facility-Administered Medications Ordered in Other Visits  Medication Dose Route Frequency Provider Last Rate Last Admin   chlorhexidine  (HIBICLENS ) 4 % liquid 4 application  60 mL Topical Once Dixon, Lovell Rubenstein, PA-C       technetium tetrofosmin  (TC-MYOVIEW ) injection 31.7 milli Curie  31.7 millicurie Intravenous Once PRN Nelson, Katarina H, MD        Allergies:    Codeine, Hydrocodone , Lamotrigine , Macrobid  [nitrofurantoin ], and Robaxin  [methocarbamol ]   Social History:  The patient  reports that she has never smoked. She has never used smokeless tobacco. She reports that she does not drink alcohol and does not use drugs.   Family History:  The patient's family history includes Cancer in her maternal uncle and paternal grandfather; Heart attack in her maternal grandfather and mother; Heart disease in her maternal grandfather and mother; Heart failure in her mother; Hyperlipidemia in her mother; Hypertension in her mother; Leukemia in her brother; Lung cancer in her paternal grandfather.  ROS:  Please see the history of present illness.    All other systems are reviewed and otherwise negative.   PHYSICAL EXAM:  VS:  There were no vitals taken for this visit. BMI: There is no height or weight on file to calculate BMI. Well nourished, well developed, in no acute distress  HEENT: normocephalic, atraumatic  Neck: no JVD, carotid bruits or masses Cardiac: RRR; no significant murmurs, no rubs, or gallops Lungs: CTA b/l, no wheezing, rhonchi or rales  Abd: soft, nontender MS: no deformity or atrophy Ext: no edema  Skin: warm and dry, no rash Neuro:  No gross deficits appreciated Psych: euthymic mood, full affect   EKG:  done today and reviewed by myself SB 56bpm, PR , QRS 90ms, QTc  07/24/21: stress myoview   The study is normal. The study is low risk.   No ST deviation was noted.   LV perfusion is normal. There is no evidence of ischemia. There is no evidence of infarction.   Left ventricular function is normal. Nuclear stress EF: 69 %. The left ventricular ejection fraction is hyperdynamic (>  65%). End diastolic cavity size is normal.   Prior study available for comparison from 08/22/2015.   No significant changes from previous study from August 22, 2015   11/22/20: TTE  1. Left ventricular ejection fraction, by estimation, is 60 to 65%. Left   ventricular ejection fraction by 3D volume is 65 %. The left ventricle has  normal function. The left ventricle has no regional wall motion  abnormalities. Left ventricular diastolic   parameters were normal. The average left ventricular global longitudinal  strain is -25.6 %. The global longitudinal strain is normal.   2. Right ventricular systolic function is normal. The right ventricular  size is normal. There is mildly elevated pulmonary artery systolic  pressure. The estimated right ventricular systolic pressure is 40.7 mmHg.   3. The mitral valve is normal in structure. No evidence of mitral valve  regurgitation.   4. The aortic valve is tricuspid. Aortic valve regurgitation is mild. No  aortic stenosis is present.   5. The inferior vena cava is dilated in size with >50% respiratory  variability, suggesting right atrial pressure of 8 mmHg.   Comparison(s): No prior Echocardiogram.    Sept 2022: monitor Patient had a min HR of 49 bpm, max HR of 184 bpm, and avg HR of 68 bpm. Predominant underlying rhythm was Sinus Rhythm. 2 Ventricular Tachycardia runs occurred, the run with the fastest interval lasting 5 beats with a max rate of 174 bpm, the longest  lasting 5 beats with an avg rate of 163 bpm. 46 Supraventricular Tachycardia runs occurred, the run with the fastest interval lasting 5 beats with a max rate of 184 bpm, the longest lasting 27.7 secs with an avg rate of 106 bpm. Isolated SVEs were rare  (<1.0%), SVE Couplets were rare (<1.0%), and SVE Triplets were rare (<1.0%). Isolated VEs were rare (<1.0%), and no VE Couplets or VE Triplets were present.   08/22/15: Lexiscan  stress test Nuclear stress EF: 76%. There was no ST segment deviation noted during stress. This is a low risk study. Basal septal wall decreased radiotracer uptake seen at both rest and stress, no ischemia identified.  Recent Labs: 12/04/2022: Magnesium  2.1 05/25/2023: ALT 13; BUN 20; Creatinine, Ser 0.89;  Hemoglobin 13.7; Platelets 268; Potassium 3.6; Sodium 139  No results found for requested labs within last 365 days.   CrCl cannot be calculated (Patient's most recent lab result is older than the maximum 21 days allowed.).   Wt Readings from Last 3 Encounters:  06/01/23 297 lb (134.7 kg)  05/25/23 296 lb (134.3 kg)  05/24/23 296 lb (134.3 kg)     Other studies reviewed: Additional studies/records reviewed today include: summarized above  ASSESSMENT AND PLAN:  1. HTN     Looks good today, no changes      2. WPW, ablated    Short PR, though no clear pre-excitation is appreciated on EKG  Palpitations monitor Oct 2024 Very brief PATs PVCs, NSVT not new for her  Flecainide  made her feel worse then the palpitations At this juncture, she prefers conservative management Discussed perhaps a PRN dilt for flare ups for now, she will hold off       3. Adrenal insufficiency     Follows with endocriology     Chronic steroids     Disposition: back in 4 mo, sooner if needed    Current medicines are reviewed at length with the patient today.  The patient did not have any concerns regarding medicines.  Signed, Leighton Punches  Kelly Patient 07/18/2023 4:28 PM     [facility] [address] [place] [address] [phone] (office)  [phone] (fax)

## 2023-07-20 ENCOUNTER — Other Ambulatory Visit: Payer: Self-pay | Admitting: Family

## 2023-07-20 ENCOUNTER — Other Ambulatory Visit: Payer: Self-pay | Admitting: Neurology

## 2023-07-20 ENCOUNTER — Other Ambulatory Visit: Payer: Self-pay | Admitting: Student

## 2023-07-20 NOTE — Telephone Encounter (Signed)
 Last seen on 05/24/23 Follow up scheduled on 09/15/23   Dispensed Days Supply Quantity Provider Pharmacy  TRAMADOL  50MG  TAB 04/23/2023 30 90 each Sater, Sherida Dimmer, MD [facility]...      Rx pending to be signed

## 2023-07-21 ENCOUNTER — Ambulatory Visit: Attending: Cardiology | Admitting: Physician Assistant

## 2023-07-21 ENCOUNTER — Encounter: Payer: Self-pay | Admitting: Physician Assistant

## 2023-07-21 VITALS — BP 104/68 | HR 56 | Ht 65.0 in | Wt 296.0 lb

## 2023-07-21 DIAGNOSIS — Z79899 Other long term (current) drug therapy: Secondary | ICD-10-CM

## 2023-07-21 DIAGNOSIS — Z5181 Encounter for therapeutic drug level monitoring: Secondary | ICD-10-CM

## 2023-07-21 DIAGNOSIS — I456 Pre-excitation syndrome: Secondary | ICD-10-CM | POA: Diagnosis not present

## 2023-07-21 DIAGNOSIS — I4719 Other supraventricular tachycardia: Secondary | ICD-10-CM

## 2023-07-21 DIAGNOSIS — I491 Atrial premature depolarization: Secondary | ICD-10-CM | POA: Diagnosis not present

## 2023-07-21 DIAGNOSIS — I10 Essential (primary) hypertension: Secondary | ICD-10-CM

## 2023-07-21 NOTE — Patient Instructions (Signed)
 Medication Instructions:   STOP TAKING :  FLECAINIDE   *If you need a refill on your cardiac medications before your next appointment, please call your pharmacy*   Lab Work: NONE ORDERED  TODAY    If you have labs (blood work) drawn today and your tests are completely normal, you will receive your results only by: MyChart Message (if you have MyChart) OR A paper copy in the mail If you have any lab test that is abnormal or we need to change your treatment, we will call you to review the results.  Testing/Procedures: NONE ORDERED  TODAY     Follow-Up: At [facility], you and your health needs are our priority.  As part of our continuing mission to provide you with exceptional heart care, our providers are all part of one team.  This team includes your primary Cardiologist (physician) and Advanced Practice Providers or APPs (Physician Assistants and Nurse Practitioners) who all work together to provide you with the care you need, when you need it.  Your next appointment:    4 month(s) ( CONTACT  CASSIE HALL/ ANGELINE HAMMER FOR EP SCHEDULING ISSUES )    Provider:    Richardo Chandler, MD or Mertha Abrahams, PA-C    We recommend signing up for the patient portal called MyChart.  Sign up information is provided on this After Visit Summary.  MyChart is used to connect with patients for Virtual Visits (Telemedicine).  Patients are able to view lab/test results, encounter notes, upcoming appointments, etc.  Non-urgent messages can be sent to your provider as well.   To learn more about what you can do with MyChart, go to [email or web address].   Other Instructions

## 2023-07-30 ENCOUNTER — Encounter: Payer: Self-pay | Admitting: Family

## 2023-07-30 ENCOUNTER — Other Ambulatory Visit: Payer: Self-pay | Admitting: Family

## 2023-07-30 DIAGNOSIS — L989 Disorder of the skin and subcutaneous tissue, unspecified: Secondary | ICD-10-CM

## 2023-08-03 ENCOUNTER — Encounter: Payer: Self-pay | Admitting: Dermatology

## 2023-08-03 ENCOUNTER — Ambulatory Visit (INDEPENDENT_AMBULATORY_CARE_PROVIDER_SITE_OTHER): Admitting: Dermatology

## 2023-08-03 DIAGNOSIS — D492 Neoplasm of unspecified behavior of bone, soft tissue, and skin: Secondary | ICD-10-CM

## 2023-08-03 DIAGNOSIS — L820 Inflamed seborrheic keratosis: Secondary | ICD-10-CM

## 2023-08-03 DIAGNOSIS — Z85828 Personal history of other malignant neoplasm of skin: Secondary | ICD-10-CM

## 2023-08-03 DIAGNOSIS — L570 Actinic keratosis: Secondary | ICD-10-CM | POA: Diagnosis not present

## 2023-08-03 DIAGNOSIS — D485 Neoplasm of uncertain behavior of skin: Secondary | ICD-10-CM

## 2023-08-03 DIAGNOSIS — W908XXA Exposure to other nonionizing radiation, initial encounter: Secondary | ICD-10-CM

## 2023-08-03 NOTE — Progress Notes (Signed)
 Follow-Up Visit   Subjective  Connie Haynes is a 68 y.o. female who presents for the following:  Lesion on the left upper forehead has been present for years. Bleeds with picking.  Lesion on the left nasal dorsum has been present for years. Denies bleeding or pain. Patient was last seen by a dermatologist in 2018 by Dr. Veryl.   History of BCC on left lower eyelid.  The patient has spots, moles and lesions to be evaluated, some may be new or changing.  The following portions of the chart were reviewed this encounter and updated as appropriate: medications, allergies, medical history  Review of Systems:  No other skin or systemic complaints except as noted in HPI or Assessment and Plan.  Objective  Well appearing patient in no apparent distress; mood and affect are within normal limits.   A focused examination was performed of the following areas: face  Relevant exam findings are noted in the Assessment and Plan.  Right Upper Eyelid 2mm flesh colored papule   Assessment & Plan   HISTORY OF BASAL CELL CARCINOMA OF THE SKIN - right eyelid - treated by Dr. Froylan with [facility].  - No evidence of recurrence today - Recommend regular full body skin exams - Recommend daily broad spectrum sunscreen SPF 30+ to sun-exposed areas, reapply every 2 hours as needed.  - Call if any new or changing lesions are noted between office visits  ACTINIC KERATOSIS Exam: Erythematous thin papules/macules with gritty scale  Actinic keratoses are precancerous spots that appear secondary to cumulative UV radiation exposure/sun exposure over time. They are chronic with expected duration over 1 year. A portion of actinic keratoses will progress to squamous cell carcinoma of the skin. It is not possible to reliably predict which spots will progress to skin cancer and so treatment is recommended to prevent development of skin cancer.  Recommend daily broad spectrum sunscreen SPF 30+ to sun-exposed  areas, reapply every 2 hours as needed.  Recommend staying in the shade or wearing long sleeves, sun glasses (UVA+UVB protection) and wide brim hats (4-inch brim around the entire circumference of the hat). Call for new or changing lesions.  Treatment Plan:  Prior to procedure, discussed risks of blister formation, small wound, skin dyspigmentation, or rare scar following cryotherapy. Recommend Vaseline ointment to treated areas while healing.   INFLAMED SEBORRHEIC KERATOSIS Exam: Erythematous keratotic or waxy stuck-on papule or plaque.  Symptomatic, irritating, patient would like treated.  Benign-appearing.  Call clinic for new or changing lesions.   Prior to procedure, discussed risks of blister formation, small wound, skin dyspigmentation, or rare scar following treatment. Recommend Vaseline ointment to treated areas while healing.  Destruction Procedure Note Destruction method: cryotherapy   Informed consent: discussed and consent obtained   Lesion destroyed using liquid nitrogen: Yes   Outcome: patient tolerated procedure well with no complications   Post-procedure details: wound care instructions given    Neoplasm of skin- right upper tarsal plate- pink papule - Will photograph and monitor  AK (ACTINIC KERATOSIS) Left Nasal Sidewall Destruction of lesion - Left Nasal Sidewall Complexity: simple   Destruction method: cryotherapy   Timeout:  patient name, date of birth, surgical site, and procedure verified Lesion destroyed using liquid nitrogen: Yes   Region frozen until ice ball extended beyond lesion: Yes   Cryotherapy cycles:  1 Outcome: patient tolerated procedure well with no complications   Post-procedure details: wound care instructions given   INFLAMED SEBORRHEIC KERATOSIS Left Forehead Destruction of  lesion - Left Forehead  Destruction method: cryotherapy   Timeout:  patient name, date of birth, surgical site, and procedure verified Lesion destroyed using  liquid nitrogen: Yes   Region frozen until ice ball extended beyond lesion: Yes   Cryotherapy cycles:  1 Outcome: patient tolerated procedure well with no complications   Post-procedure details: wound care instructions given   NEOPLASM OF UNCERTAIN BEHAVIOR OF SKIN Right Upper Eyelid Monitoring.  Return in about 2 months (around 10/03/2023) for Full Body Skin Exam and AK follow up.  LILLETTE Rollene Gobble, RN, am acting as scribe for RUFUS CHRISTELLA HOLY, MD .   Documentation: I have reviewed the above documentation for accuracy and completeness, and I agree with the above.  RUFUS CHRISTELLA HOLY, MD

## 2023-08-03 NOTE — Patient Instructions (Addendum)
 For areas treated with Liquid Nitrogen:  Keep clean with soap and water.  Apply Vaseline or Aquaphor twice daily.   Important Information  Due to recent changes in healthcare laws, you may see results of your pathology and/or laboratory studies on MyChart before the doctors have had a chance to review them. We understand that in some cases there may be results that are confusing or concerning to you. Please understand that not all results are received at the same time and often the doctors may need to interpret multiple results in order to provide you with the best plan of care or course of treatment. Therefore, we ask that you please give us  2 business days to thoroughly review all your results before contacting the office for clarification. Should we see a critical lab result, you will be contacted sooner.   If You Need Anything After Your Visit  If you have any questions or concerns for your doctor, please call our main line at [phone] If no one answers, please leave a voicemail as directed and we will return your call as soon as possible. Messages left after 4 pm will be answered the following business day.   You may also send us  a message via MyChart. We typically respond to MyChart messages within 1-2 business days.  For prescription refills, please ask your pharmacy to contact our office. Our fax number is [phone].  If you have an urgent issue when the clinic is closed that cannot wait until the next business day, you can page your doctor at the number below.    Please note that while we do our best to be available for urgent issues outside of office hours, we are not available 24/7.   If you have an urgent issue and are unable to reach us , you may choose to seek medical care at your doctor's office, retail clinic, urgent care center, or emergency room.  If you have a medical emergency, please immediately call 911 or go to the emergency department. In the event of inclement  weather, please call our main line at [phone] for an update on the status of any delays or closures.  Dermatology Medication Tips: Please keep the boxes that topical medications come in in order to help keep track of the instructions about where and how to use these. Pharmacies typically print the medication instructions only on the boxes and not directly on the medication tubes.   If your medication is too expensive, please contact our office at [phone] or send us  a message through MyChart.   We are unable to tell what your co-pay for medications will be in advance as this is different depending on your insurance coverage. However, we may be able to find a substitute medication at lower cost or fill out paperwork to get insurance to cover a needed medication.   If a prior authorization is required to get your medication covered by your insurance company, please allow us  1-2 business days to complete this process.  Drug prices often vary depending on where the prescription is filled and some pharmacies may offer cheaper prices.  The website www.goodrx.com contains coupons for medications through different pharmacies. The prices here do not account for what the cost may be with help from insurance (it may be cheaper with your insurance), but the website can give you the price if you did not use any insurance.  - You can print the associated coupon and take it with your prescription to the pharmacy.  -  You may also stop by our office during regular business hours and pick up a GoodRx coupon card.  - If you need your prescription sent electronically to a different pharmacy, notify our office through [facility] or by phone at [phone]

## 2023-08-04 ENCOUNTER — Other Ambulatory Visit

## 2023-08-10 ENCOUNTER — Ambulatory Visit: Admitting: "Endocrinology

## 2023-08-24 ENCOUNTER — Ambulatory Visit (INDEPENDENT_AMBULATORY_CARE_PROVIDER_SITE_OTHER)

## 2023-08-24 VITALS — Ht 65.0 in | Wt 296.0 lb

## 2023-08-24 DIAGNOSIS — Z0000 Encounter for general adult medical examination without abnormal findings: Secondary | ICD-10-CM | POA: Diagnosis not present

## 2023-08-24 NOTE — Patient Instructions (Signed)
 Ms. Jose , Thank you for taking time out of your busy schedule to complete your Annual Wellness Visit with me. I enjoyed our conversation and look forward to speaking with you again next year. I, as well as your care team,  appreciate your ongoing commitment to your health goals. Please review the following plan we discussed and let me know if I can assist you in the future. Your Game plan/ To Do List    Follow up Visits: Next Medicare AWV with our clinical staff: In 1 year    Have you seen your provider in the last 6 months (3 months if uncontrolled diabetes)? Yes Next Office Visit with your provider: 08/27/23 @ 9  Clinician Recommendations:  Aim for 30 minutes of exercise or brisk walking, 6-8 glasses of water , and 5 servings of fruits and vegetables each day.       This is a list of the screening recommended for you and due dates:  Health Maintenance  Topic Date Due   Hepatitis C Screening  Never done   Zoster (Shingles) Vaccine (1 of 2) Never done   COVID-19 Vaccine (4 - 2024-25 season) 10/11/2022   Flu Shot  09/10/2023   Colon Cancer Screening  05/15/2024   Medicare Annual Wellness Visit  08/23/2024   Mammogram  09/07/2024   Pneumococcal Vaccine for age over 48 (3 of 3 - PCV20 or PCV21) 02/20/2025   DTaP/Tdap/Td vaccine (5 - Td or Tdap) 12/30/2027   DEXA scan (bone density measurement)  Completed   Hepatitis B Vaccine  Aged Out   HPV Vaccine  Aged Out   Meningitis B Vaccine  Aged Out    Advanced directives: (ACP Link)Information on Advanced Care Planning can be found at [place]  Print production planner Health Care Directives Advance Health Care Directives. [email or web address]    Advance Care Planning is important because it:  [x]  Makes sure you receive the medical care that is consistent with your values, goals, and preferences  [x]  It provides guidance to your family and loved ones and reduces their decisional burden about whether or not they are making the right decisions  based on your wishes.  Follow the link provided in your after visit summary or read over the paperwork we have mailed to you to help you started getting your Advance Directives in place. If you need assistance in completing these, please reach out to us  so that we can help you!  See attachments for Preventive Care and Fall Prevention Tips.

## 2023-08-24 NOTE — Progress Notes (Signed)
 Subjective:   Connie Haynes is a 68 y.o. who presents for a Medicare Wellness preventive visit.  As a reminder, Annual Wellness Visits don't include a physical exam, and some assessments may be limited, especially if this visit is performed virtually. We may recommend an in-person follow-up visit with your provider if needed.  Visit Complete: Virtual I connected with  [name] on 08/24/23 by a video and audio enabled telemedicine application and verified that I am speaking with the correct person using two identifiers.  Patient Location: Home  Provider Location: Home Office  I discussed the limitations of evaluation and management by telemedicine. The patient expressed understanding and agreed to proceed.  Vital Signs: Because this visit was a virtual/telehealth visit, some criteria may be missing or patient reported. Any vitals not documented were not able to be obtained and vitals that have been documented are patient reported.  Persons Participating in Visit: Patient.  AWV Questionnaire: Yes: Patient Medicare AWV questionnaire was completed by the patient on 08/24/23; I have confirmed that all information answered by patient is correct and no changes since this date.  Cardiac Risk Factors include: advanced age (>102men, >45 women);dyslipidemia;hypertension     Objective:    Today's Vitals   08/24/23 0859  Weight: 296 lb (134.3 kg)  Height: 5' 5 (1.651 m)   Body mass index is 49.26 kg/m.     08/24/2023    9:02 AM 05/25/2023   12:57 PM 09/02/2022    1:52 PM 01/14/2021    8:05 AM 08/25/2016    3:50 PM 08/25/2016   11:02 AM 08/19/2016    9:37 AM  Advanced Directives  Does Patient Have a Medical Advance Directive? No No Yes Yes Yes  Yes  Yes   Type of Surveyor, minerals;Living will Healthcare Power of [place];Living will Healthcare Power of [place];Living will Healthcare Power of [place];Living will Healthcare Power of [place];Living will   Does patient want to make changes to medical advance directive?   No - Patient declined  No - Patient declined   No - Patient declined   Copy of Healthcare Power of Attorney in Chart?   No - copy requested Yes - validated most recent copy scanned in chart (See row information)     Would patient like information on creating a medical advance directive? Yes (MAU/Ambulatory/Procedural Areas - Information given) No - Patient declined          Data saved with a previous flowsheet row definition    Current Medications (verified) Outpatient Encounter Medications as of 08/24/2023  Medication Sig   carvedilol  (COREG ) 25 MG tablet Take 1 tablet by mouth twice daily   citalopram  (CELEXA ) 40 MG tablet Take 1 tablet (40 mg total) by mouth daily.   fosfomycin (MONUROL ) 3 g PACK Take 3 g by mouth every 10 days   furosemide  (LASIX ) 40 MG tablet TAKE 2 TABLETS BY MOUTH EVERY OTHER DAY   hydrocortisone  (CORTEF ) 10 MG tablet Take 5-10 mg by mouth See admin instructions. Take 10mg  in the AM and 5mg  in the afternoon.   LINZESS  72 MCG capsule Take 1 capsule (72 mcg total) by mouth daily before breakfast.   LORazepam  (ATIVAN ) 0.5 MG tablet TAKE 1 TABLET BY MOUTH TWICE DAILY AS NEEDED   Multiple Vitamin (MULTIVITAMIN ADULT PO) Take 1 tablet by mouth daily.   ondansetron  (ZOFRAN ) 4 MG tablet Take 4 mg by mouth every 8 (eight) hours as needed.   potassium chloride  (  KLOR-CON ) 10 MEQ tablet Take 10 mEq by mouth as needed. WITH LASIX    pramipexole  (MIRAPEX ) 1 MG tablet TAKE 1 PO BID   predniSONE  (DELTASONE ) 20 MG tablet Take 3 pills po the day before infusions.   riTUXimab  (RITUXAN IV) Inject into the vein.   telmisartan -hydrochlorothiazide  (MICARDIS  HCT) 80-12.5 MG tablet Take 1 tablet by mouth daily.   tiZANidine  (ZANAFLEX ) 4 MG tablet TAKE 1 TO 1 & 1/2 (ONE & ONE-HALF) TABLETS BY MOUTH THREE TIMES DAILY AS NEEDED   traMADol  (ULTRAM ) 50 MG tablet TAKE 1 TABLET BY MOUTH EVERY 8 HOURS   lacosamide  (VIMPAT ) 50 MG TABS  tablet Take 1 tablet (50 mg total) by mouth 2 (two) times daily.   Facility-Administered Encounter Medications as of 08/24/2023  Medication   chlorhexidine  (HIBICLENS ) 4 % liquid 4 application   technetium tetrofosmin  (TC-MYOVIEW ) injection 31.7 milli Curie    Allergies (verified) Codeine, Hydrocodone , Lamotrigine , Macrobid  [nitrofurantoin ], and Robaxin  [methocarbamol ]   History: Past Medical History:  Diagnosis Date   Adrenal insufficiency (HCC)    Anemia    denies   Anxiety    Basal cell carcinoma    eye lid   Cataract    Chronic kidney disease, stage 3 (HCC)    Cough    hx. dry cough chronic   Disorder of tear duct system    frequent tearing of eyes   GERD (gastroesophageal reflux disease)    H/O: whooping cough 2000   History of kidney infection 2015   ? sepsis   History of kidney stones    History of kidney stones    Hyperlipidemia    Hypertension    IBS (irritable bowel syndrome)    controlled with med   Osteoarthritis    arthritis -joints-limited ROM shoulders more on right   PONV (postoperative nausea and vomiting)     per pt hx of low BP following shoulder surgery in 05-2015; was managed in hospital with IV fluids and d/c BP being taken a tthe time; pt was dced and F/U with endcrinoopgist who dx with adrenal insurffiency and started steroid therapy; treatmetnt with positive outome, now managed chronically on hydrocortisone  tablets;   Sleep apnea    no longer using cpap   Stroke (HCC)    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 Windy)    APPENDECTOMY     CARDIAC ELECTROPHYSIOLOGY STUDY AND ABLATION     '93 for WPW syndrome   CARPAL TUNNEL RELEASE Right 12/21/2014   Procedure: RIGHT CARPAL TUNNEL RELEASE ;  Surgeon: Marcey Her, MD;  Location: [facility] OR;  Service: Orthopedics;  Laterality: Right;   CARPAL TUNNEL RELEASE Left    CERVICAL SPINE SURGERY     cervical fusion with hardware retained   COLON SURGERY     colon detached from abdomen   COLONOSCOPY WITH PROPOFOL  N/A 05/10/2014   Procedure: COLONOSCOPY WITH PROPOFOL ;  Surgeon: Renaye Sous, MD;  Location: [facility] END[facility];  Service: Endoscopy;  Laterality: N/A;   CYSTOSCOPY/RETROGRADE/URETEROSCOPY Left 12/18/2013   Procedure: CYSTOSCOPY/RETROGRADE/LEFT STENT;  Surgeon: Norleen PARAS  Watt, MD;  Location: [facility] ORS;  Service: Urology;  Laterality: Left;   ESOPHAGOGASTRODUODENOSCOPY (EGD) WITH PROPOFOL  N/A 05/10/2014   Procedure: ESOPHAGOGASTRODUODENOSCOPY (EGD) WITH PROPOFOL ;  Surgeon: Renaye Sous, MD;  Location: [facility];  Service: Endoscopy;  Laterality: N/A;   EYE SURGERY     EYELID CARCINOMA EXCISION     INSERTION OF DIALYSIS CATHETER N/A 08/16/2019   Procedure: INSERTION OF DIALYSIS CATHETER;  Surgeon: Brenna Adine CROME, DO;  Location: [facility];  Service: Pulmonary;  Laterality: N/A;   JOINT REPLACEMENT     KNEE ARTHROSCOPY Left 07/16/2016   REVERSE SHOULDER ARTHROPLASTY Right 06/29/2014   Procedure: REVERSE TOTAL SHOULDER ARTHROPLASTY;  Surgeon: Marcey Her, MD;  Location[facility] OR;  Service: Orthopedics;  Laterality: Right;   REVISION TOTAL SHOULDER TO REVERSE TOTAL SHOULDER Left 05/15/2015   REVISION TOTAL SHOULDER TO REVERSE TOTAL SHOULDER Left 05/15/2015   Procedure: LEFT SHOULDER REVISION TOTAL SHOULDER ARTHRPLASTY TO REVERSE TOTAL SHOULDER ARTHROPLASTY ;  Surgeon: Marcey Her, MD;  Location: [facility] OR;  Service: Orthopedics;  Laterality: Left;   SPINE SURGERY     tear duct surgery     TOTAL ABDOMINAL HYSTERECTOMY     TOTAL HIP ARTHROPLASTY     x 3(rt x2, lt x1)    TOTAL KNEE ARTHROPLASTY Right 08/08/2013   Procedure: RIGHT TOTAL KNEE ARTHROPLASTY WITH SAPHENOUS NERVE SCAR EXCISION;  Surgeon: Donnice JONETTA Car, MD;  Location: [facility];  Service: Orthopedics;  Laterality: Right;   TOTAL KNEE ARTHROPLASTY Left 08/25/2016   Procedure: LEFT TOTAL KNEE ARTHROPLASTY;  Surgeon: Car Donnice, MD;  Location: [facility];  Service: Orthopedics;  Laterality: Left;  70 mins; Adductor block   TOTAL SHOULDER ARTHROPLASTY Left 07/15/2012   Procedure: LEFT TOTAL SHOULDER ARTHROPLASTY;  Surgeon: [name], MD;  Location: [facility] OR;  Service: Orthopedics;  Laterality: Left;   TOTAL SHOULDER REVISION Right 12/21/2014   Procedure: RIGHT SHOULDER POLY EXCHANGE ;  Surgeon: Marcey Her, MD;  Location: [facility] OR;  Service: Orthopedics;  Laterality: Right;   URETEROSCOPY  stent placement   Family History  Problem Relation Age of Onset   Heart disease Mother    Hypertension Mother    Hyperlipidemia Mother    Heart attack Mother    Heart failure Mother    Leukemia Brother    Anxiety disorder Brother    Cancer Maternal Uncle        bone   Heart disease Maternal Grandfather    Heart attack Maternal Grandfather    Cancer Paternal Grandfather    Lung cancer Paternal Grandfather    COPD Brother    Seizures Neg Hx    Social History   Socioeconomic History   Marital status: Married    Spouse name: Sport and exercise psychologist   Number of children: 0   Years of education: Not on file   Highest education level: Associate degree: occupational, Scientist, product/process development, or vocational program  Occupational History   Occupation: Retired     Associate Professor: [employer]  Tobacco Use   Smoking status: Never   Smokeless tobacco: Never  Vaping Use   Vaping status: Never Used  Substance and Sexual Activity   Alcohol use: No    Alcohol/week: 0.0 standard drinks of alcohol   Drug use: No   Sexual activity: Not Currently    Partners: Male  Other Topics Concern   Not on file  Social History Narrative   Marital  Status:  Married Armed forces operational officer)   Living Situation: Lives with spouse   Occupation: Architectural technologist  Education:  2 years of college   Tobacco Use/Exposure:  None   Alcohol Use: None   Drug Use:  None   Diet:  Low Fat/Low Carb   Exercise:  3-4 Days per week   Hobbies:  Reading, Travel   Left handed    Caffeine: 1 cup coffee every morning, diet coke (rare)   Social Drivers of Health   Financial Resource Strain: Medium Risk (08/24/2023)   Overall Financial Resource Strain (CARDIA)    Difficulty of Paying Living Expenses: Somewhat hard  Food Insecurity: No Food Insecurity (08/24/2023)   Hunger Vital Sign    Worried About Running Out of Food in the Last Year: Never true    Ran Out of Food in the Last Year: Never true  Transportation Needs: No Transportation Needs (08/24/2023)   PRAPARE - Administrator, Civil Service (Medical): No    Lack of Transportation (Non-Medical): No  Physical Activity: Sufficiently Active (08/24/2023)   Exercise Vital Sign    Days of Exercise per Week: 5 days    Minutes of Exercise per Session: 30 min  Stress: Stress Concern Present (08/24/2023)   [employer] of Occupational Health - Occupational Stress Questionnaire    Feeling of Stress: To some extent  Social Connections: Moderately Isolated (08/24/2023)   Social Connection and Isolation Panel    Frequency of Communication with Friends and Family: Three times a week    Frequency of Social Gatherings with Friends and Family: Once a week    Attends Religious Services: Never    Database administrator or Organizations: No    Attends Engineer, structural: Never    Marital Status: Married    Tobacco Counseling Counseling given: Not Answered    Clinical Intake:  Pre-visit preparation completed: Yes  Pain : No/denies pain     Diabetes: No  No results found for: HGBA1C   How often do you need to have someone help you when you read instructions, pamphlets, or other written  materials from your doctor or pharmacy?: 1 - Never  Interpreter Needed?: No  Information entered by :: Charmaine Bloodgood LPN   Activities of Daily Living     08/24/2023    6:35 AM 09/01/2022   10:48 AM  In your present state of health, do you have any difficulty performing the following activities:  Hearing? 0 0  Vision? 0 1  Difficulty concentrating or making decisions? 0 1  Walking or climbing stairs? 1 1  Dressing or bathing? 1 0  Doing errands, shopping? 1 1  Comment  husband Insurance claims handler and eating ? N N  Using the Toilet? N N  In the past six months, have you accidently leaked urine? Y Y  Do you have problems with loss of bowel control? N N  Managing your Medications? N N  Managing your Finances? N N  Housekeeping or managing your Housekeeping? N N    Patient Care Team: Jason Leita Repine, FNP as PCP - General (Internal Medicine) Fernande Elspeth BROCKS, MD as PCP - Electrophysiology (Cardiology) Sater, Charlie LABOR, MD (Neurology) Corey Rufus HERO, MD (Dermatology) Claudene Elsie JUDITHANN Mickey., MD (Endocrinology) Francella Fairy SAILOR, OD (Optometry)  I have updated your Care Teams any recent Medical Services you may have received from other providers in the past year.     Assessment:   This is a routine wellness examination for Ibtisam.  Hearing/Vision screen Hearing Screening - Comments:: Denies hearing difficulties   Vision Screening -  Comments:: Wears rx glasses - up to date with routine eye exams with Dr. Francella    Goals Addressed             This Visit's Progress    Prevent falls   On track      Depression Screen     08/24/2023    9:01 AM 06/07/2023    5:31 PM 09/02/2022    1:53 PM 06/04/2022   10:03 AM  PHQ 2/9 Scores  PHQ - 2 Score 1 1 2  0    Fall Risk     08/24/2023    6:35 AM 09/01/2022   10:48 AM 06/04/2022   10:02 AM  Fall Risk   Falls in the past year? 1 1 1   Number falls in past yr: 0 1 1  Injury with Fall? 0 0 0  Risk for fall due to :  Impaired balance/gait;Impaired mobility;History of fall(s) History of fall(s);Impaired balance/gait History of fall(s)  Follow up Falls evaluation completed;Education provided;Falls prevention discussed Falls evaluation completed Falls evaluation completed    MEDICARE RISK AT HOME:  Medicare Risk at Home Any stairs in or around the home?: (Patient-Rptd) Yes If so, are there any without handrails?: (Patient-Rptd) No Home free of loose throw rugs in walkways, pet beds, electrical cords, etc?: (Patient-Rptd) Yes Adequate lighting in your home to reduce risk of falls?: (Patient-Rptd) Yes Life alert?: (Patient-Rptd) No Use of a cane, walker or w/c?: (Patient-Rptd) Yes Grab bars in the bathroom?: (Patient-Rptd) Yes Shower chair or bench in shower?: (Patient-Rptd) No Elevated toilet seat or a handicapped toilet?: (Patient-Rptd) No  TIMED UP AND GO:  Was the test performed?  No  Cognitive Function: Declined/Normal: No cognitive concerns noted by patient or family. Patient alert, oriented, able to answer questions appropriately and recall recent events. No signs of memory loss or confusion.        09/02/2022    1:58 PM  6CIT Screen  What Year? 0 points  What month? 0 points  What time? 0 points  Count back from 20 0 points  Months in reverse 0 points  Repeat phrase 0 points  Total Score 0 points    Immunizations Immunization History  Administered Date(s) Administered   Influenza Split 12/15/2001, 10/27/2012   Influenza Whole 11/10/2010   Influenza, High Dose Seasonal PF 11/07/2018, 11/07/2018, 11/29/2019   Influenza,inj,Quad PF,6+ Mos 10/04/2012, 11/06/2014, 10/29/2015, 10/21/2016, 11/10/2017   Influenza,inj,quad, With Preservative 10/28/2016   Influenza-Unspecified 12/15/2001, 10/04/2012, 10/10/2013, 11/06/2014, 10/29/2015, 10/21/2016, 11/10/2017   PFIZER Comirnaty(Gray Top)Covid-19 Tri-Sucrose Vaccine 10/01/2019   PFIZER(Purple Top)SARS-COV-2 Vaccination 03/13/2019, 04/03/2019    PPD Test 08/13/2019   Pneumococcal Conjugate-13 02/21/2020   Pneumococcal Polysaccharide-23 12/20/2013   Td 08/02/2002, 08/02/2002   Tdap 05/06/2005, 12/29/2017    Screening Tests Health Maintenance  Topic Date Due   Hepatitis C Screening  Never done   Zoster Vaccines- Shingrix (1 of 2) Never done   COVID-19 Vaccine (4 - 2024-25 season) 10/11/2022   INFLUENZA VACCINE  09/10/2023   Colonoscopy  05/15/2024   Medicare Annual Wellness (AWV)  08/23/2024   MAMMOGRAM  09/07/2024   Pneumococcal Vaccine: 50+ Years (3 of 3 - PCV20 or PCV21) 02/20/2025   DTaP/Tdap/Td (5 - Td or Tdap) 12/30/2027   DEXA SCAN  Completed   Hepatitis B Vaccines  Aged Out   HPV VACCINES  Aged Out   Meningococcal B Vaccine  Aged Out    Health Maintenance  Health Maintenance Due  Topic Date Due   Hepatitis C  Screening  Never done   Zoster Vaccines- Shingrix (1 of 2) Never done   COVID-19 Vaccine (4 - 2024-25 season) 10/11/2022   Health Maintenance Items Addressed: Information on Shingrix provided   Additional Screening:  Vision Screening: Recommended annual ophthalmology exams for early detection of glaucoma and other disorders of the eye. Would you like a referral to an eye doctor? No    Dental Screening: Recommended annual dental exams for proper oral hygiene  Community Resource Referral / Chronic Care Management: CRR required this visit?  No   CCM required this visit?  No   Plan:    I have personally reviewed and noted the following in the patient's chart:   Medical and social history Use of alcohol, tobacco or illicit drugs  Current medications and supplements including opioid prescriptions. Patient is not currently taking opioid prescriptions. Functional ability and status Nutritional status Physical activity Advanced directives List of other physicians Hospitalizations, surgeries, and ER visits in previous 12 months Vitals Screenings to include cognitive, depression, and  falls Referrals and appointments  In addition, I have reviewed and discussed with patient certain preventive protocols, quality metrics, and best practice recommendations. A written personalized care plan for preventive services as well as general preventive health recommendations were provided to patient.   Lavelle Pfeiffer [place], [place]   [id number]/84/[id number]   After Visit Summary: (MyChart) Due to this being a telephonic visit, the after visit summary with patients personalized plan was offered to patient via MyChart   Notes: Nothing significant to report at this time.

## 2023-08-24 NOTE — Addendum Note (Signed)
 Addended by: LAVELLE PFEIFFER B on: 08/24/2023 09:49 AM   Modules accepted: Level of Service

## 2023-08-27 ENCOUNTER — Ambulatory Visit: Admitting: Family

## 2023-09-07 ENCOUNTER — Other Ambulatory Visit: Payer: Self-pay | Admitting: Family

## 2023-09-10 ENCOUNTER — Encounter: Payer: Self-pay | Admitting: Family

## 2023-09-10 ENCOUNTER — Ambulatory Visit (INDEPENDENT_AMBULATORY_CARE_PROVIDER_SITE_OTHER): Admitting: Family

## 2023-09-10 VITALS — BP 118/70 | HR 62 | Resp 16 | Ht 65.0 in

## 2023-09-10 DIAGNOSIS — E271 Primary adrenocortical insufficiency: Secondary | ICD-10-CM

## 2023-09-10 DIAGNOSIS — G959 Disease of spinal cord, unspecified: Secondary | ICD-10-CM | POA: Diagnosis not present

## 2023-09-10 DIAGNOSIS — M67432 Ganglion, left wrist: Secondary | ICD-10-CM | POA: Diagnosis not present

## 2023-09-10 DIAGNOSIS — I10 Essential (primary) hypertension: Secondary | ICD-10-CM

## 2023-09-10 DIAGNOSIS — R911 Solitary pulmonary nodule: Secondary | ICD-10-CM | POA: Diagnosis not present

## 2023-09-10 DIAGNOSIS — Z6841 Body Mass Index (BMI) 40.0 and over, adult: Secondary | ICD-10-CM | POA: Diagnosis not present

## 2023-09-10 DIAGNOSIS — J8410 Pulmonary fibrosis, unspecified: Secondary | ICD-10-CM | POA: Diagnosis not present

## 2023-09-10 MED ORDER — LINZESS 72 MCG PO CAPS: 72.0000 ug | ORAL_CAPSULE | Freq: Every day | ORAL | 3 refills | Status: DC

## 2023-09-10 MED ORDER — TELMISARTAN-HCTZ 80-12.5 MG PO TABS: 1.0000 | ORAL_TABLET | Freq: Every day | ORAL | 3 refills | Status: DC

## 2023-09-10 MED ORDER — CITALOPRAM HYDROBROMIDE 40 MG PO TABS
40.0000 mg | ORAL_TABLET | Freq: Every day | ORAL | 3 refills | Status: DC
Start: 2023-09-10 — End: 2024-01-05

## 2023-09-10 NOTE — Progress Notes (Signed)
 Connie Haynes is a 68 y.o. female with the following history as recorded in [employer]:  Patient Active Problem List   Diagnosis Date Noted   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   Long-term current use of immunomodulator 05/18/2022   Acid reflux 10/14/2021   Epigastric pain 10/14/2021   Decreased GFR 09/26/2021   Palpitations - suggestive of PVC's 06/24/2021   Abnormal urinalysis 03/21/2021   Acute cystitis with hematuria 12/20/2020   Age related osteoporosis 12/20/2020   Nocturnal hypoxia 12/20/2020   Post-menopausal 12/20/2020   Supplemental oxygen dependent 12/20/2020   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HCC) 10/30/2020   Encounter for immunotherapy 10/30/2020   History of optic neuritis 10/30/2020   Hypokalemia 09/19/2020   Urinary retention 07/17/2020   Encounter for monitoring immunomodulating therapy 05/02/2020   Vitamin D  deficiency 03/07/2020   Numbness 08/29/2019   Gait disturbance 08/29/2019   Abnormal brain MRI 08/29/2019   High risk medication use 08/29/2019   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HCC) 08/11/2019   Adrenal insufficiency (Addison's disease) (HCC) 08/11/2019   Neck pain 02/08/2019   CKD (chronic kidney disease) stage 2, GFR 60-89 ml/min 06/23/2017   Nausea and vomiting 05/05/2017   S/P left TKA 08/25/2016   S/P total knee replacement 08/25/2016   Imbalance 01/16/2016   Lumbar disc herniation with radiculopathy 01/16/2016   Lumbar facet arthropathy 01/16/2016   Low back pain 06/24/2015   Tremor 06/24/2015   Weakness of both lower extremities 06/24/2015   S/P shoulder replacement 06/29/2014   OSA (obstructive sleep apnea) 01/22/2014   Left ureteral stone 12/18/2013   Frequent UTI 12/18/2013   Morbid obesity (HCC) 08/09/2013   S/P right TKA 08/08/2013   Inflammation of eyelid, left 06/01/2013   Dry eye of left side 05/01/2013   Nasolacrimal duct obstruction, acquired 03/25/2013   Epiphora due to insufficient drainage 03/09/2013   Chronic pain syndrome  03/04/2013   Depressive disorder, not elsewhere classified 03/04/2013   Ectropion, senile 02/20/2013   Punctal stenosis 02/20/2013   Trichiasis of eyelid 02/20/2013   Need for prophylactic vaccination and inoculation against influenza 10/04/2012   Erosive osteoarthritis of multiple sites 10/04/2012   Chronic cough 10/04/2012   Swelling of limb 08/21/2012   Essential hypertension, benign 08/21/2012   Preoperative general physical examination 06/12/2012   Restless leg syndrome 06/12/2012   Anxiety 06/12/2012   Headache 04/04/2012   Sinusitis, chronic 04/04/2012   Blocked lacrimal duct 09/23/2011   Excessive tearing 09/23/2011   Lung nodule 07/16/2011   Cough 04/21/2011   Wolff-Parkinson-White (WPW) syndrome    Osteoarthritis    Hyperlipidemia    IBS (irritable bowel syndrome)    Nephrolithiasis     Current Outpatient Medications  Medication Sig Dispense Refill   carvedilol  (COREG ) 25 MG tablet Take 1 tablet by mouth twice daily 180 tablet 2   fosfomycin (MONUROL ) 3 g PACK Take 3 g by mouth every 10 days 9 g 3   furosemide  (LASIX ) 40 MG tablet TAKE 2 TABLETS BY MOUTH EVERY OTHER DAY 90 tablet 3   hydrocortisone  (CORTEF ) 10 MG tablet Take 5-10 mg by mouth See admin instructions. Take 10mg  in the AM and 5mg  in the afternoon.     LORazepam  (ATIVAN ) 0.5 MG tablet TAKE 1 TABLET BY MOUTH TWICE DAILY AS NEEDED 60 tablet 5   Multiple Vitamin (MULTIVITAMIN ADULT PO) Take 1 tablet by mouth daily.     ondansetron  (ZOFRAN ) 4 MG tablet Take 4 mg by mouth every 8 (eight) hours as needed.     potassium  chloride (KLOR-CON ) 10 MEQ tablet Take 10 mEq by mouth as needed. WITH LASIX      pramipexole  (MIRAPEX ) 1 MG tablet TAKE 1 PO BID 180 tablet 3   predniSONE  (DELTASONE ) 20 MG tablet Take 3 pills po the day before infusions. 12 tablet 0   riTUXimab  (RITUXAN IV) Inject into the vein.     tiZANidine  (ZANAFLEX ) 4 MG tablet TAKE 1 TO 1 & 1/2 (ONE & ONE-HALF) TABLETS BY MOUTH THREE TIMES DAILY AS NEEDED  270 tablet 2   traMADol  (ULTRAM ) 50 MG tablet TAKE 1 TABLET BY MOUTH EVERY 8 HOURS 90 tablet 0   citalopram  (CELEXA ) 40 MG tablet Take 1 tablet (40 mg total) by mouth daily. 90 tablet 3   LINZESS  72 MCG capsule Take 1 capsule (72 mcg total) by mouth daily before breakfast. 90 capsule 3   telmisartan -hydrochlorothiazide  (MICARDIS  HCT) 80-12.5 MG tablet Take 1 tablet by mouth daily. 90 tablet 3   No current facility-administered medications for this visit.   Facility-Administered Medications Ordered in Other Visits  Medication Dose Route Frequency Provider Last Rate Last Admin   chlorhexidine  (HIBICLENS ) 4 % liquid 4 application  60 mL Topical Once Dixon, Debby Cain, PA-C       technetium tetrofosmin  (TC-MYOVIEW ) injection 31.7 milli Curie  31.7 millicurie Intravenous Once PRN Maranda Leim DEL, MD        Allergies: Codeine, Hydrocodone , Lamotrigine , Macrobid  [nitrofurantoin ], and Robaxin  [methocarbamol ]  Past Medical History:  Diagnosis Date   Adrenal insufficiency (HCC)    Anemia    denies   Anxiety    Basal cell carcinoma    eye lid   Cataract    Chronic kidney disease, stage 3 (HCC)    Cough    hx. dry cough chronic   Disorder of tear duct system    frequent tearing of eyes   GERD (gastroesophageal reflux disease)    H/O: whooping cough 2000   History of kidney infection 2015   ? sepsis   History of kidney stones    History of kidney stones    Hyperlipidemia    Hypertension    IBS (irritable bowel syndrome)    controlled with med   Osteoarthritis    arthritis -joints-limited ROM shoulders more on right   PONV (postoperative nausea and vomiting)     per pt hx of low BP following shoulder surgery in 05-2015; was managed in hospital with IV fluids and d/c BP being taken a tthe time; pt was dced and F/U with endcrinoopgist who dx with adrenal insurffiency and started steroid therapy; treatmetnt with positive outome, now managed chronically on hydrocortisone  tablets;    Sleep apnea    no longer using cpap   Stroke (HCC)    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 Windy)   APPENDECTOMY     CARDIAC ELECTROPHYSIOLOGY STUDY AND ABLATION     '93 for WPW syndrome   CARPAL TUNNEL RELEASE Right 12/21/2014   Procedure: RIGHT CARPAL TUNNEL RELEASE ;  Surgeon: Marcey Her, MD;  Location: [facility] OR;  Service: Orthopedics;  Laterality: Right;   CARPAL TUNNEL RELEASE Left    CERVICAL SPINE SURGERY     cervical fusion with hardware retained   COLON SURGERY     colon detached from abdomen   COLONOSCOPY WITH PROPOFOL  N/A 05/10/2014   Procedure: COLONOSCOPY WITH PROPOFOL ;  Surgeon: Renaye Sous, MD;  Location: [facility];  Service: Endoscopy;  Laterality: N/A;   CYSTOSCOPY/RETROGRADE/URETEROSCOPY Left 12/18/2013   Procedure: CYSTOSCOPY/RETROGRADE/LEFT STENT;  Surgeon: Norleen JINNY Seltzer, MD;  Location: [facility];  Service: Urology;  Laterality: Left;   ESOPHAGOGASTRODUODENOSCOPY (EGD) WITH PROPOFOL  N/A 05/10/2014   Procedure: ESOPHAGOGASTRODUODENOSCOPY (EGD) WITH PROPOFOL ;  Surgeon: Renaye Sous, MD;  Location: [facility];  Service: Endoscopy;  Laterality: N/A;   EYE SURGERY     EYELID CARCINOMA EXCISION     INSERTION OF DIALYSIS CATHETER N/A 08/16/2019   Procedure: INSERTION OF DIALYSIS CATHETER;  Surgeon: Brenna Adine CROME, DO;  Location: [facility];  Service: Pulmonary;  Laterality: N/A;   JOINT REPLACEMENT     KNEE ARTHROSCOPY Left 07/16/2016   REVERSE SHOULDER ARTHROPLASTY Right 06/29/2014   Procedure: REVERSE TOTAL SHOULDER ARTHROPLASTY;  Surgeon:  Marcey Her, MD;  Location: [facility] OR;  Service: Orthopedics;  Laterality: Right;   REVISION TOTAL SHOULDER TO REVERSE TOTAL SHOULDER Left 05/15/2015   REVISION TOTAL SHOULDER TO REVERSE TOTAL SHOULDER Left 05/15/2015   Procedure: LEFT SHOULDER REVISION TOTAL SHOULDER ARTHRPLASTY TO REVERSE TOTAL SHOULDER ARTHROPLASTY ;  Surgeon: Marcey Her, MD;  Location: [facility] OR;  Service: Orthopedics;  Laterality: Left;   SPINE SURGERY     tear duct surgery     TOTAL ABDOMINAL HYSTERECTOMY     TOTAL HIP ARTHROPLASTY     x 3(rt x2, lt x1)   TOTAL KNEE ARTHROPLASTY Right 08/08/2013   Procedure: RIGHT TOTAL KNEE ARTHROPLASTY WITH SAPHENOUS NERVE SCAR EXCISION;  Surgeon: Donnice JONETTA Car, MD;  Location: [facility];  Service: Orthopedics;  Laterality: Right;   TOTAL KNEE ARTHROPLASTY Left 08/25/2016   Procedure: LEFT TOTAL KNEE ARTHROPLASTY;  Surgeon: Car Donnice, MD;  Location: [facility];  Service: Orthopedics;  Laterality: Left;  70 mins; Adductor block   TOTAL SHOULDER ARTHROPLASTY Left 07/15/2012   Procedure: LEFT TOTAL SHOULDER ARTHROPLASTY;  Surgeon: [name], MD;  Location: [facility] OR;  Service: Orthopedics;  Laterality: Left;   TOTAL SHOULDER REVISION Right 12/21/2014   Procedure: RIGHT SHOULDER POLY EXCHANGE ;  Surgeon: Marcey Her, MD;  Location[facility] OR;  Service: Orthopedics;  Laterality: Right;   URETEROSCOPY  stent placement    Family History  Problem Relation Age of Onset   Heart disease Mother    Hypertension Mother    Hyperlipidemia Mother    Heart attack Mother    Heart failure Mother    Leukemia Brother    Anxiety disorder Brother    Cancer Maternal Uncle        bone   Heart disease Maternal Grandfather    Heart attack Maternal Grandfather    Cancer Paternal Grandfather    Lung cancer Paternal Grandfather    COPD Brother    Seizures Neg Hx     Social History   Tobacco Use   Smoking status: Never   Smokeless tobacco: Never  Substance Use Topics   Alcohol use: No    Alcohol/week: 0.0  standard drinks of alcohol    Subjective:   Follow up on chronic health needs-  1) Requesting refills on Linzess , Citalopram , Micardis  HCT; 2) Requesting to have lung nodules re-checked; notes she has been  having some increased wheezing for the past months- last chest CT done in 2023;  3) Patient is due to see her cardiologist to follow up on recent heart monitor- last OV there in June 2025; concerned about fluctuations in heart rate/ ? A fib from home monitor;  4) Needs referral to orthopedist to discuss treatment for ganglion cyst left wrist; Continuing  to work with neurology and endocrinology regularly;     Objective:  Vitals:   09/10/23 0903  BP: 118/70  Pulse: 62  Resp: 16  SpO2: 95%  Height: 5' 5 (1.651 m)    General: Well developed, well nourished, in no acute distress  Skin : Warm and dry.  Head: Normocephalic and atraumatic  Eyes: Sclera and conjunctiva clear; pupils round and reactive to light; extraocular movements intact  Ears: External normal; canals clear; tympanic membranes normal  Oropharynx: Pink, supple. No suspicious lesions  Neck: Supple without thyromegaly, adenopathy  Lungs: Respirations unlabored; clear to auscultation bilaterally without wheeze, rales, rhonchi  CVS exam: normal rate and regular rhythm.  Abdomen: Soft; nontender; nondistended; normoactive bowel sounds; no masses or hepatosplenomegaly  Musculoskeletal: No deformities; no active joint inflammation  Extremities: No edema, cyanosis, clubbing  Vessels: Symmetric bilaterally  Neurologic: Alert and oriented; speech intact; face symmetrical; moves all extremities well; CNII-XII intact without focal deficit   Assessment:  1. Ganglion cyst of dorsum of left wrist   2. BMI 45.0-49.9, adult (HCC)   3. Essential hypertension, benign   4. Lung nodule   5. Postinflammatory pulmonary fibrosis (HCC) Chronic  6. Adrenal insufficiency (Addison's disease) (HCC) Chronic  7. Disease of spinal cord  (HCC) Chronic    Plan:  Refer to ortho; Refer to [facility]; Stable; refill updated; Update chest CT;   Continue close follow up with her neurologist and endocrinologist;   Return in about 1 year (around 09/09/2024) for follow up.  Orders Placed This Encounter  Procedures   CT Chest Wo Contrast    Standing Status:   Future    Expiration Date:   09/09/2024    Preferred imaging location?:   [facility]   Ambulatory referral to Orthopedic Surgery    Referral Priority:   Routine    Referral Type:   Surgical    Referral Reason:   Specialty Services Required    Requested Specialty:   Orthopedic Surgery    Number of Visits Requested:   1   Amb Ref to [facility]    Referral Priority:   Routine    Referral Type:   Consultation    Number of Visits Requested:   1    Requested Prescriptions   Signed Prescriptions Disp Refills   citalopram  (CELEXA ) 40 MG tablet 90 tablet 3    Sig: Take 1 tablet (40 mg total) by mouth daily.   LINZESS  72 MCG capsule 90 capsule 3    Sig: Take 1 capsule (72 mcg total) by mouth daily before breakfast.   telmisartan -hydrochlorothiazide  (MICARDIS  HCT) 80-12.5 MG tablet 90 tablet 3    Sig: Take 1 tablet by mouth daily.

## 2023-09-13 ENCOUNTER — Encounter (INDEPENDENT_AMBULATORY_CARE_PROVIDER_SITE_OTHER): Payer: Self-pay

## 2023-09-13 DIAGNOSIS — Z1231 Encounter for screening mammogram for malignant neoplasm of breast: Secondary | ICD-10-CM | POA: Diagnosis not present

## 2023-09-13 LAB — HM MAMMOGRAPHY

## 2023-09-15 ENCOUNTER — Encounter: Payer: Self-pay | Admitting: Neurology

## 2023-09-15 ENCOUNTER — Ambulatory Visit (INDEPENDENT_AMBULATORY_CARE_PROVIDER_SITE_OTHER): Admitting: Neurology

## 2023-09-15 ENCOUNTER — Encounter: Payer: Self-pay | Admitting: Family

## 2023-09-15 VITALS — BP 112/65 | HR 65 | Ht 65.0 in | Wt 291.5 lb

## 2023-09-15 DIAGNOSIS — R339 Retention of urine, unspecified: Secondary | ICD-10-CM | POA: Diagnosis not present

## 2023-09-15 DIAGNOSIS — Z79899 Other long term (current) drug therapy: Secondary | ICD-10-CM

## 2023-09-15 DIAGNOSIS — Z8669 Personal history of other diseases of the nervous system and sense organs: Secondary | ICD-10-CM

## 2023-09-15 DIAGNOSIS — R404 Transient alteration of awareness: Secondary | ICD-10-CM

## 2023-09-15 DIAGNOSIS — Z7961 Long term (current) use of immunomodulator: Secondary | ICD-10-CM

## 2023-09-15 DIAGNOSIS — R269 Unspecified abnormalities of gait and mobility: Secondary | ICD-10-CM

## 2023-09-15 DIAGNOSIS — G369 Acute disseminated demyelination, unspecified: Secondary | ICD-10-CM | POA: Diagnosis not present

## 2023-09-15 MED ORDER — TIZANIDINE HCL 4 MG PO TABS: ORAL_TABLET | ORAL | 3 refills | Status: AC

## 2023-09-15 NOTE — Progress Notes (Signed)
 [facility]  PATIENT: Connie Haynes DOB: 02-Jan-1956  REFERRING DOCTOR OR PCP: Grayce Greenland, MD SOURCE: Patient, notes from recent hospitalization, imaging and laboratory reports, MRI images personally reviewed.  _________________________________   HISTORICAL  CHIEF COMPLAINT:  Chief Complaint  Patient presents with   Follow-up    Pt in room 10. Alone. Here for 6 month follow up. Patient states she stable, no falls.    HISTORY OF PRESENT ILLNESS:  Connie Haynes is a 68 y.o. 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   Her last Rituximab  was 04/22/2023.  She has tolerated it well and has not had any definite exacerbation.  We have done 500 mg due to continued low IgG.   Due to itching, she is taking prednisone  the day befpre the infusion now with benefit.      MRI brain 05/30/2023 showed Nonenhancing T2/FLAIR hyperintense focus in the inferior right frontal lobe, unchanged compared to the 10/05/2021 MRI and improved compared to the 08/11/2019 MRI when it was enhancing.   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  Couple scattered small T2/FLAIR hyperintense foci in the cerebral hemispheres.  None of these appear to be acute.  No change compared to the MRI from 10/05/2021.  Gait, Motor and sensory symptoms are stable over the last year.  Balance is poor (stable) and she has stumbles more than once a month - usually against the wall/door jamb so no bad fall  to ground .  She has a walker  Monitoring has shown she has variable heart rate and some AFib.   She sees Dr. Fernande.  She was tried on flecainide  and felt much more fatigued so she stopped.      She has had spells since January - initially with swimmy headed and about t pass out but not vertigo.    She feels off balanced.  She notes altered awareness, understanding what others were saying but having  a blank face and not talking.   First couple spells were mild and due to rapid recovery she was less concerned.  In March, she had a couple more severe episodes and would have fallen with one if husband had not caught.      Spells last 30-90 seconds and last episode was 05/19/2023.    Her friend though her mouth was twitching but no GTC.  Most spells occurred while sitting and 2 while standing.  None while laying down.     She did not have any medication change before the spells started.   She only takes tramodol 4 times a month  We did an EEG - during the hyperventialtion she felt strange (notes from 05/25/2023):  From 05/25/2023:  [name] was seen in the office yesterday (for details see my note from yesterday).  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  Besides the LETM, she also had an enhancing lesion in the brain at diagnosis  Due to spells of altered awareness she had an EEG today.  As part of the EEG she did 2 to 3 minutes of hyperventilation.  She felt very strange afterwards and then started to shaking (more like chills rather than seizure).  She also became more confused.  At the end of the EEG, she was unable to stand up (she is normally weak in  her legs but is able to walk, often without support)  I evaluated her.  There was no seizure activity.  She was able to move her legs but more weakly than typical.  She was not her typical self and also seem very anxious.  I helped her get into her car.  I advised the husband that I recommend that she go to the emergency room for evaluation.  I am concerned that she is having more neurologic symptoms.  I am hoping that she is able to get an MRI of the brain, cervical spine and thoracic spine.  We need to rule out additional demyelination.  If present we will need to consider a different medication besides Rituxan.  At additionally, because of her  fluctuating nature and chills, I am hoping that she can be evaluated for UTI, pneumonia and metabolic changes.  Note: The EEG was essentially normal and did not show any epileptiform activity.   Vision is stable.   She is not driving since her health issues worsened.   Her husband drives.  She has a left altitudinal visual field cut (confirmed on recent Humphrey VF testing with ophth).  She feels vision may be slightly worse.  She also has recently noted difficulty tracking items  She is reporting more pain in her legs.  She also notes more left leg numbness and more RLS.   Pramipexole  has not helped much. Tramadol  has helped the legs some and she only takes once a week or so..    She prefer not to do gabapentin as poorly tolerated and prefers not to do Lyrica.    Lamotrigine  caused dizziness.    She takes prn lorazepam  but has not taken at bedtime.  Legs bother her less if she sits or is active but more if resting a long time.   She notes absent sensation below the hips with just a little bit of sensation in the thighs..Arms are not numb.   . She notes mild weakness in legs.   Due to the numbness, she needs to see her feet as she walks but she is able to walk without a cane.    She has dysesthesias on legs that can often be very painful.     She has urinary retention and hesitancy.  She has occasional UTI - has one now.     She will feel some pressure when the bladder is full but not every time.   Similar with bowel.   Scheduled urination and defecation may help some.    She is on steroid for adrenal insufficiency and had a recent crisis during an infection.      Repeat spine imaging MRIs 07/23/2020 compared them to the previous ones.  The MRIs look better showing no new lesions and fairly complete resolution of the enhancing focus that had extended from the mid cervical spine to the upper thoracic spine.   RLS is better on lorazepam  addition to pramipexole .   LETM/NMOSD HISTORY In late June  2021, she had the onset of urinary retention and the next morning had numbness from the waist down.   She initially presented to the ED but left due to the long wait.   She re-presented to [facility] emergency room when she had worsening with more intense numbness, weakness and reduced gait.  MRIs showed a longitudinal enhancing lesion in the lower cervical and upper thoracic spine (C4-T4) and the brain showed 2 foci, one that enhanced near the basal ganglia on the  right.    She was admitted for further evaluation and treatment.  She had high dose steroids for 5 days but did not note a benefit.  She then had 5 treatments of plasma exchange..    Strength improved 08/22/2019 and she was able to take a few steps.   She was doing some physical therapy while in the hospital.  By the time of discharge, she was able to use a walker.  She improved further over the next month but continued to have numbness and reduced gait.    Her LETM and left ON occurred within a few days back in 2021.  Additionally, she had a demyelinating focus within the brain.  The vision change noticed shortly after presenting to the hospital for her leg/groin numbness,     She had a Covid vaccination 4 months earlier but nothing within a month.      We received the results of the [facility] anti-NMO and anti-MOG test.  Both were negative.  MRIs showed a longitudinal enhancing lesion in the lower cervical and upper thoracic spine (C4-T4) and the brain showed 2 foci, one that enhanced near the basal ganglia on the right.  We had discussed that, despite that result, I am very concerned that she is at an elevated risk of a relapse.  Besides the inflammatory transverse myelitis, she also had an enhancing lesion in the brain.  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  Other tests including vasculitis labs were negative though CNS vasculitis would not always show up in blood work.  She has adrenal insufficiency and is on Cortef .  She also has  stage 2 CKD, HTN, WPW (S/p ablation).  I personally reviewed the MRIs of the lumbar spine, thoracic spine, cervical spine and brain performed 08/10/2019 and 08/11/2019.  Anti-AQ4 was  checked with new FACS test in 2025 and was negative.       Imaging review: MRI brain 08/11/2019: There is a 9 mm enhancing focus in the inferior right frontal lobe associated with some mass-effect.  Additionally, there is a small left T2/flair hyperintense focus just above the left internal capsule.  The etiology of the focus lesion is uncertain.  It could represent a focus of demyelination.  Neoplasm cannot be completely ruled out.  The more chronic appearing focus could be demyelinating or ischemic  MRI of the cervical spine and thoracic spine 08/10/2019: Central to posterior enhancing focus within the spinal cord from C4-T4.  This causes some enlargement of the cord.  Additionally there is ACDF at C5-C7 and DJD at C3-C4 and C4-C5.  MRI of the lumbar spine 08/10/2019: There is anterolisthesis of L3 upon L4 associated with facet hypertrophy and protrusion of the uncovered disc.  There is foraminal narrowing, more on the right.  Milder degenerative changes are noted at some of the lumbar levels but no spinal stenosis or significant nerve root compression.  MRI of the cervical and thoracic spine 01/10/2020 showed greatly improved appearance of the abnormal signal.  On this MRI, there is just minimal T2 hyperintensity adjacent to C6-C7 and mild abnormal enhancement posteriorly adjacent to C7.  MRI of the brain 01/10/2020 showed interval improvement of the enhancing focus in the inferior right frontal lobe that have been seen on the 08/11/2019 MRI.  There was still a small focus of enhancement in that region.  Several other T2/FLAIR hyperintense foci in the hemispheres are more consistent with minimal stable chronic microvascular ischemic change.  MRI of the cervical  and thoracic spine 07/23/2020 showed complete resolution of the  enhancing focus from C4-T4 that has been seen on previous MRIs.   However, there appears to be mild atrophy at C7-T1.    MRI of the brain 10/05/2021 showed a right inferior frontal/medial temporal focus that is hyperintense on T2 weighted and FLAIR images consistent with remote demyelination.  In 2021, this focus was larger, associated with edema and enhanced.  It no longer is associated with edema or enhances.   Remote lacunar infarction adjacent to the basal ganglia on the left, unchanged compared to the previous MRI.  Few scattered T2/FLAIR hyperintense foci in the hemispheres consistent with minimal age appropriate chronic microvascular ischemic changes.  MRI of the cervical and thoracic spine 822/2023 showed resolution of the C4-T4 lesion noted on the previous MRIs.  MRI brain 05/30/2023 showed Nonenhancing T2/FLAIR hyperintense focus in the inferior right frontal lobe, unchanged compared to the 10/05/2021 MRI and improved compared to the 08/11/2019 MRI when it was enhancing.   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  Couple scattered small T2/FLAIR hyperintense foci in the cerebral hemispheres.  None of these appear to be acute.  No change compared to the MRI from 10/05/2021.  Laboratory review While in the hospital 08/2019, she had a LP (CSF was normal except slightly elevated protein); NMO Ab (negative), anti-MOG (negative).  Additionally she had ANCA, HIV, TB, B12, rheumatoid factor, anti-Jo 1, ENA, HSV, ACE.  These were all negative or normal.    July 2021:  Hep B cAb, Hep B sAg, Hep B sAb all negative.    ANA was negative  Repeat [facility] anti-Mog and anti-NMO were negative 09/2019   REVIEW OF SYSTEMS: Constitutional: No fevers, chills, sweats, or change in appetite Eyes: No visual changes, double vision, eye pain Ear, nose and throat: No hearing loss, ear pain, nasal congestion, sore throat Cardiovascular: No chest pain, palpitations Respiratory:  No shortness  of breath at rest or with exertion.   No wheezes GastrointestinaI: No nausea, vomiting, diarrhea, abdominal pain, fecal incontinence Genitourinary:  No dysuria, urinary retention or frequency.  No nocturia. Musculoskeletal:  No neck pain, back pain Integumentary: No rash, pruritus, skin lesions Neurological: as above Psychiatric: No depression at this time.  No anxiety Endocrine: No palpitations, diaphoresis, change in appetite, change in weigh or increased thirst Hematologic/Lymphatic:  No anemia, purpura, petechiae. Allergic/Immunologic: No itchy/runny eyes, nasal congestion, recent allergic reactions, rashes  ALLERGIES: Allergies  Allergen Reactions   Codeine Nausea And Vomiting    Can take if takes phernergan   Hydrocodone  Nausea Only   Lamotrigine      dizziness, shakiness and just feeling not right.   Macrobid  [Nitrofurantoin ] Diarrhea and Nausea And Vomiting   Robaxin  [Methocarbamol ] Rash    HOME MEDICATIONS:  Current Outpatient Medications:    carvedilol  (COREG ) 25 MG tablet, Take 1 tablet by mouth twice daily, Disp: 180 tablet, Rfl: 2   citalopram  (CELEXA ) 40 MG tablet, Take 1 tablet (40 mg total) by mouth daily., Disp: 90 tablet, Rfl: 3   fosfomycin (MONUROL ) 3 g PACK, Take 3 g by mouth every 10 days, Disp: 9 g, Rfl: 3   furosemide  (LASIX ) 40 MG tablet, TAKE 2 TABLETS BY MOUTH EVERY OTHER DAY, Disp: 90 tablet, Rfl: 3   hydrocortisone  (CORTEF ) 10 MG tablet, Take 5-10 mg by mouth See admin instructions. Take 10mg  in the AM and 5mg  in the afternoon., Disp: , Rfl:    LINZESS  72 MCG capsule, Take  1 capsule (72 mcg total) by mouth daily before breakfast., Disp: 90 capsule, Rfl: 3   LORazepam  (ATIVAN ) 0.5 MG tablet, TAKE 1 TABLET BY MOUTH TWICE DAILY AS NEEDED, Disp: 60 tablet, Rfl: 5   Multiple Vitamin (MULTIVITAMIN ADULT PO), Take 1 tablet by mouth daily., Disp: , Rfl:    ondansetron  (ZOFRAN ) 4 MG tablet, Take 4 mg by mouth every 8 (eight) hours as needed., Disp: , Rfl:     potassium chloride  (KLOR-CON ) 10 MEQ tablet, Take 10 mEq by mouth as needed. WITH LASIX , Disp: , Rfl:    pramipexole  (MIRAPEX ) 1 MG tablet, TAKE 1 PO BID, Disp: 180 tablet, Rfl: 3   predniSONE  (DELTASONE ) 20 MG tablet, Take 3 pills po the day before infusions., Disp: 12 tablet, Rfl: 0   riTUXimab  (RITUXAN IV), Inject into the vein., Disp: , Rfl:    telmisartan -hydrochlorothiazide  (MICARDIS  HCT) 80-12.5 MG tablet, Take 1 tablet by mouth daily., Disp: 90 tablet, Rfl: 3   traMADol  (ULTRAM ) 50 MG tablet, TAKE 1 TABLET BY MOUTH EVERY 8 HOURS, Disp: 90 tablet, Rfl: 0   tiZANidine  (ZANAFLEX ) 4 MG tablet, TAKE 1 TO 1 & 1/2 (ONE & ONE-HALF) TABLETS BY MOUTH THREE TIMES DAILY AS NEEDED, Disp: 270 tablet, Rfl: 3 No current facility-administered medications for this visit.  Facility-Administered Medications Ordered in Other Visits:    chlorhexidine  (HIBICLENS ) 4 % liquid 4 application, 60 mL, Topical, Once, Dixon, Debby Cain, PA-C   technetium tetrofosmin  (TC-MYOVIEW ) injection 31.7 milli Curie, 31.7 millicurie, Intravenous, Once PRN, Maranda Leim DEL, MD  PAST MEDICAL HISTORY: Past Medical History:  Diagnosis Date   A-fib [facility])    Adrenal insufficiency (HCC)    Anemia    denies   Anxiety    Basal cell carcinoma    eye lid   Cataract    Chronic kidney disease, stage 3 (HCC)    Cough    hx. dry cough chronic   Disorder of tear duct system    frequent tearing of eyes   GERD (gastroesophageal reflux disease)    H/O: whooping cough 2000   History of kidney infection 2015   ? sepsis   History of kidney stones    History of kidney stones    Hyperlipidemia    Hypertension    IBS (irritable bowel syndrome)    controlled with med   Osteoarthritis    arthritis -joints-limited ROM shoulders more on right   PONV (postoperative nausea and vomiting)     per pt hx of low BP following shoulder surgery in 05-2015; was managed in hospital with IV fluids and d/c BP being taken a tthe time; pt was  dced and F/U with endcrinoopgist who dx with adrenal insurffiency and started steroid therapy; treatmetnt with positive outome, now managed chronically on hydrocortisone  tablets;   Sleep apnea    no longer using cpap   Stroke (HCC)    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 Windy)   APPENDECTOMY     CARDIAC ELECTROPHYSIOLOGY STUDY AND ABLATION     '  93 for WPW syndrome   CARPAL TUNNEL RELEASE Right 12/21/2014   Procedure: RIGHT CARPAL TUNNEL RELEASE ;  Surgeon: Marcey Her, MD;  Location: [facility] OR;  Service: Orthopedics;  Laterality: Right;   CARPAL TUNNEL RELEASE Left    CERVICAL SPINE SURGERY     cervical fusion with hardware retained   COLON SURGERY     colon detached from abdomen   COLONOSCOPY WITH PROPOFOL  N/A 05/10/2014   Procedure: COLONOSCOPY WITH PROPOFOL ;  Surgeon: Renaye Sous, MD;  Location: [facility];  Service: Endoscopy;  Laterality: N/A;   CYSTOSCOPY/RETROGRADE/URETEROSCOPY Left 12/18/2013   Procedure: CYSTOSCOPY/RETROGRADE/LEFT STENT;  Surgeon: Norleen JINNY Seltzer, MD;  Location: [facility];  Service: Urology;  Laterality: Left;   ESOPHAGOGASTRODUODENOSCOPY (EGD) WITH PROPOFOL  N/A 05/10/2014   Procedure: ESOPHAGOGASTRODUODENOSCOPY (EGD) WITH PROPOFOL ;  Surgeon: Renaye Sous, MD;  Location: [facility];  Service: Endoscopy;  Laterality: N/A;   EYE SURGERY     EYELID CARCINOMA EXCISION     INSERTION OF DIALYSIS CATHETER N/A 08/16/2019   Procedure: INSERTION OF DIALYSIS CATHETER;  Surgeon: Brenna Adine CROME, DO;  Location: [facility]COPY;   Service: Pulmonary;  Laterality: N/A;   JOINT REPLACEMENT     KNEE ARTHROSCOPY Left 07/16/2016   REVERSE SHOULDER ARTHROPLASTY Right 06/29/2014   Procedure: REVERSE TOTAL SHOULDER ARTHROPLASTY;  Surgeon: Marcey Her, MD;  Location[facility] OR;  Service: Orthopedics;  Laterality: Right;   REVISION TOTAL SHOULDER TO REVERSE TOTAL SHOULDER Left 05/15/2015   REVISION TOTAL SHOULDER TO REVERSE TOTAL SHOULDER Left 05/15/2015   Procedure: LEFT SHOULDER REVISION TOTAL SHOULDER ARTHRPLASTY TO REVERSE TOTAL SHOULDER ARTHROPLASTY ;  Surgeon: Marcey Her, MD;  Location: [facility] OR;  Service: Orthopedics;  Laterality: Left;   SPINE SURGERY     tear duct surgery     TOTAL ABDOMINAL HYSTERECTOMY     TOTAL HIP ARTHROPLASTY     x 3(rt x2, lt x1)   TOTAL KNEE ARTHROPLASTY Right 08/08/2013   Procedure: RIGHT TOTAL KNEE ARTHROPLASTY WITH SAPHENOUS NERVE SCAR EXCISION;  Surgeon: Donnice JONETTA Car, MD;  Location: [facility];  Service: Orthopedics;  Laterality: Right;   TOTAL KNEE ARTHROPLASTY Left 08/25/2016   Procedure: LEFT TOTAL KNEE ARTHROPLASTY;  Surgeon: Car Donnice, MD;  Location: [facility];  Service: Orthopedics;  Laterality: Left;  70 mins; Adductor block   TOTAL SHOULDER ARTHROPLASTY Left 07/15/2012   Procedure: LEFT TOTAL SHOULDER ARTHROPLASTY;  Surgeon: [name], MD;  Location: [facility] OR;  Service: Orthopedics;  Laterality: Left;   TOTAL SHOULDER REVISION Right 12/21/2014   Procedure: RIGHT SHOULDER POLY EXCHANGE ;  Surgeon: Marcey Her, MD;  Location: [facility] OR;  Service: Orthopedics;  Laterality: Right;   URETEROSCOPY  stent placement    FAMILY HISTORY: Family History  Problem Relation Age of Onset   Heart disease Mother    Hypertension Mother    Hyperlipidemia Mother    Heart attack Mother    Heart failure Mother    Leukemia Brother    Anxiety disorder Brother    Cancer Maternal Uncle        bone   Heart disease Maternal Grandfather    Heart attack Maternal Grandfather    Cancer Paternal Grandfather     Lung cancer Paternal Grandfather    COPD Brother    Seizures Neg Hx     SOCIAL HISTORY:  Social History   Socioeconomic History   Marital status: Married    Spouse name: Orlyn   Number of children: 0   Years of education: Not on file  Highest education level: Associate degree: occupational, Scientist, product/process development, or vocational program  Occupational History   Occupation: Retired     Associate Professor: [employer]  Tobacco Use   Smoking status: Never   Smokeless tobacco: Never  Vaping Use   Vaping status: Never Used  Substance and Sexual Activity   Alcohol use: No    Alcohol/week: 0.0 standard drinks of alcohol   Drug use: No   Sexual activity: Not Currently    Partners: Male  Other Topics Concern   Not on file  Social History Narrative   Marital Status:  Married Armed forces operational officer)   Living Situation: Lives with spouse   Occupation: Psychologist, 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   Tobacco Use/Exposure:  None   Alcohol Use: None   Drug Use:  None   Diet:  Low Fat/Low Carb   Exercise:  3-4 Days per week   Hobbies:  Reading, Travel   Left handed    Caffeine: 1 cup coffee every morning, diet coke (rare)   Social Drivers of Health   Financial Resource Strain: Medium Risk (08/24/2023)   Overall Financial Resource Strain (CARDIA)    Difficulty of Paying Living Expenses: Somewhat hard  Food Insecurity: No Food Insecurity (08/24/2023)   Hunger Vital Sign    Worried About Running Out of Food in the Last Year: Never true    Ran Out of Food in the Last Year: Never true  Transportation Needs: No Transportation Needs (08/24/2023)   PRAPARE - Administrator, Civil Service (Medical): No    Lack of Transportation (Non-Medical): No  Physical Activity: Sufficiently Active (08/24/2023)   Exercise Vital Sign    Days of Exercise per Week: 5 days    Minutes of Exercise per Session: 30 min  Stress: Stress Concern Present (08/24/2023)   [employer] of Occupational Health -  Occupational Stress Questionnaire    Feeling of Stress: To some extent  Social Connections: Moderately Isolated (08/24/2023)   Social Connection and Isolation Panel    Frequency of Communication with Friends and Family: Three times a week    Frequency of Social Gatherings with Friends and Family: Once a week    Attends Religious Services: Never    Database administrator or Organizations: No    Attends Banker Meetings: Never    Marital Status: Married  Catering manager Violence: Not At Risk (08/24/2023)   Humiliation, Afraid, Rape, and Kick questionnaire    Fear of Current or Ex-Partner: No    Emotionally Abused: No    Physically Abused: No    Sexually Abused: No     PHYSICAL EXAM  Vitals:   09/15/23 1244  BP: 112/65  Pulse: 65  SpO2: 98%  Weight: 291 lb 8 oz (132.2 kg)  Height: 5' 5 (1.651 m)    Body mass index is 48.51 kg/m.   General: The patient is well-developed and well-nourished and in no acute distress  HEENT:  Head is [place]/AT.  Sclera are anicteric.   Neck: No carotid bruits are noted.  The neck is tender in lower cervical paraspinal muscles  Skin: Extremities are without rash or edema.  Neurologic Exam  Mental status: The patient is alert and oriented x 3 at the time of the examination. The patient has apparent normal recent and remote memory, with an apparently normal attention span and concentration ability.   Speech is normal.  Cranial nerves: She had difficulty with saccades but was able to track a  larger item.  Extraocular movements are full.  Color vision is reduced OS, VA is reduced OS but she can still read large print  Facial symmetry is present. There is good facial sensation to soft touch bilaterally.Facial strength is normal.  Trapezius and sternocleidomastoid strength is normal. No dysarthria is noted.   No obvious hearing deficits are noted.  Motor:  Muscle bulk is normal.   Tone is normal.  Strength is 4+/5 deltoids, 5 biceps and 4+/5  left tricep, 3 to 4 -/5 in proximal legs, 4/5 with quadriceps and 4 -/5 in the feet and ankles  Sensory: She has absent vibration sensation in the legs and absent touch/ temperaturein in left leg and reduced in right leg (notes sensation more in the thighs than lower leg) and lower flanks to lower chest, left worse than right in trunk.   Slight reduced vibration sensation in 5th fingers relative to 2nd fingers.    Coordination: Cerebellar testing reveals good finger-nose-finger.  She has a poor heel-to-shin.    Gait and station: She is unable to stand up from the chair without using her arms for support.  She is able to walk within the room not using her cane.  She cannot do a tandem walk.  The Romberg is borderline positive.  Reflexes: Deep tendon reflexes are symmetric and normal in arms, cross adductors at the knees.  No clonus.SABRA        DIAGNOSTIC DATA (LABS, IMAGING, TESTING) - I reviewed patient records, labs, notes, testing and imaging myself where available.  Lab Results  Component Value Date   WBC 7.2 05/25/2023   HGB 13.7 05/25/2023   HCT 42.7 05/25/2023   MCV 95.5 05/25/2023   PLT 268 05/25/2023      Component Value Date/Time   NA 139 05/25/2023 1322   NA 139 12/04/2022 0908   K 3.6 05/25/2023 1322   CL 105 05/25/2023 1322   CO2 23 05/25/2023 1322   GLUCOSE 97 05/25/2023 1322   BUN 20 05/25/2023 1322   BUN 16 12/04/2022 0908   CREATININE 0.89 05/25/2023 1322   CREATININE 0.87 06/18/2015 1207   CALCIUM  9.5 05/25/2023 1322   PROT 6.7 05/25/2023 1322   ALBUMIN  4.2 05/25/2023 1322   ALBUMIN  4.0 08/10/2019 2356   AST 17 05/25/2023 1322   ALT 13 05/25/2023 1322   ALKPHOS 67 05/25/2023 1322   BILITOT 0.7 05/25/2023 1322   GFRNONAA >60 05/25/2023 1322   GFRAA >60 08/24/2019 0417   Lab Results  Component Value Date   CHOL 79 08/17/2019   HDL 22 (L) 08/17/2019   LDLCALC 29 08/17/2019   TRIG 141 08/17/2019   CHOLHDL 3.6 08/17/2019   No results found for:  HGBA1C Lab Results  Component Value Date   VITAMINB12 376 08/11/2019   Lab Results  Component Value Date   TSH 3.119 08/11/2019       ASSESSMENT AND PLAN  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HCC) - Plan: IgG, IgA, IgM, CBC with Differential/Platelet, CD20 B Cells  High risk medication use - Plan: IgG, IgA, IgM, CBC with Differential/Platelet, CD20 B Cells  History of optic neuritis  Gait disturbance  Urinary retention  Long-term current use of immunomodulator  Transient alteration of awareness   1.   She will continue Rituxan at 500 mg q6 months.  We discussed that they may modify the dose if IgG or IgM dropped further.  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   2.   She has not had any more severe episodes of altered awareness.  EEG did not show any epileptiform activity and MRI did not show any additional findings. 3.    Continue tizanidine .  Continue lorazepam  at bedtime and continue pramipexole  (can not tolerate gabapentin).   4.   Stay active and exercise as tolerated.   .   5.   Return in 6 months or sooner if there are new or worsening neurologic symptoms.     40-minute office visit with the majority of the time spent face-to-face for history and physical, discussion/counseling and decision-making.  Additional time with record review and documentation.  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  Ayiana Winslett A. Vear, MD, [facility] 09/15/2023, 8:26 PM Certified in Neurology, Clinical Neurophysiology, Sleep Medicine and Neuroimaging  [facility] [address] [place], [place] [address] [phone]

## 2023-09-16 LAB — CBC WITH DIFFERENTIAL/PLATELET
Basophils Absolute: 0.1 x10E3/uL (ref 0.0–0.2)
Basos: 1 %
EOS (ABSOLUTE): 0.3 x10E3/uL (ref 0.0–0.4)
Eos: 4 %
Hematocrit: 39.1 % (ref 34.0–46.6)
Hemoglobin: 12.9 g/dL (ref 11.1–15.9)
Immature Grans (Abs): 0 x10E3/uL (ref 0.0–0.1)
Immature Granulocytes: 0 %
Lymphocytes Absolute: 1.1 x10E3/uL (ref 0.7–3.1)
Lymphs: 14 %
MCH: 31.4 pg (ref 26.6–33.0)
MCHC: 33 g/dL (ref 31.5–35.7)
MCV: 95 fL (ref 79–97)
Monocytes Absolute: 0.6 x10E3/uL (ref 0.1–0.9)
Monocytes: 8 %
Neutrophils Absolute: 5.9 x10E3/uL (ref 1.4–7.0)
Neutrophils: 73 %
Platelets: 259 x10E3/uL (ref 150–450)
RBC: 4.11 x10E6/uL (ref 3.77–5.28)
RDW: 12.7 % (ref 11.7–15.4)
WBC: 8 x10E3/uL (ref 3.4–10.8)

## 2023-09-16 LAB — IGG, IGA, IGM
IgA/Immunoglobulin A, Serum: 63 mg/dL — ABNORMAL LOW (ref 87–352)
IgG (Immunoglobin G), Serum: 414 mg/dL — ABNORMAL LOW (ref 586–1602)
IgM (Immunoglobulin M), Srm: 7 mg/dL — ABNORMAL LOW (ref 26–217)

## 2023-09-17 ENCOUNTER — Ambulatory Visit: Payer: Self-pay | Admitting: Neurology

## 2023-09-20 ENCOUNTER — Ambulatory Visit (HOSPITAL_BASED_OUTPATIENT_CLINIC_OR_DEPARTMENT_OTHER)
Admission: RE | Admit: 2023-09-20 | Discharge: 2023-09-20 | Disposition: A | Discharge status: Home / Self Care | Source: Ambulatory Visit | Attending: Family | Admitting: Family

## 2023-09-20 DIAGNOSIS — R911 Solitary pulmonary nodule: Secondary | ICD-10-CM | POA: Insufficient documentation

## 2023-09-20 NOTE — Telephone Encounter (Signed)
 Gave updated order to [facility]

## 2023-09-22 ENCOUNTER — Other Ambulatory Visit: Payer: Self-pay | Admitting: Urology

## 2023-09-22 DIAGNOSIS — N390 Urinary tract infection, site not specified: Secondary | ICD-10-CM

## 2023-09-22 MED ORDER — FOSFOMYCIN TROMETHAMINE 3 G PO PACK
PACK | ORAL | 3 refills | Status: DC
Start: 2023-09-22 — End: 2023-11-24

## 2023-09-23 ENCOUNTER — Telehealth: Payer: Self-pay | Admitting: Urology

## 2023-09-23 NOTE — Telephone Encounter (Signed)
LVM for patient to call back and sch

## 2023-09-23 NOTE — Telephone Encounter (Signed)
-----   Message from Adine Manly sent at 09/22/2023  4:54 PM EDT ----- Please schedule patient for a follow-up appointment next available.  She was last seen in January 2025.

## 2023-09-27 ENCOUNTER — Encounter: Payer: Self-pay | Admitting: Family

## 2023-09-27 ENCOUNTER — Other Ambulatory Visit: Payer: Self-pay

## 2023-09-27 ENCOUNTER — Other Ambulatory Visit: Payer: Self-pay | Admitting: Neurology

## 2023-09-27 MED ORDER — TRAMADOL HCL 50 MG PO TABS: 50.0000 mg | ORAL_TABLET | Freq: Three times a day (TID) | ORAL | 0 refills | Status: DC

## 2023-09-27 NOTE — Telephone Encounter (Signed)
 Pt Last seen 09/15/2023 Upcoming Appointment 04/19/24  Tramadol  Last filled 07/20/2023

## 2023-09-28 ENCOUNTER — Ambulatory Visit: Admitting: Dermatology

## 2023-09-28 ENCOUNTER — Encounter: Payer: Self-pay | Admitting: Dermatology

## 2023-09-28 ENCOUNTER — Encounter: Payer: Self-pay | Admitting: Neurology

## 2023-09-28 VITALS — BP 120/67 | HR 68

## 2023-09-28 DIAGNOSIS — L814 Other melanin hyperpigmentation: Secondary | ICD-10-CM | POA: Diagnosis not present

## 2023-09-28 DIAGNOSIS — L570 Actinic keratosis: Secondary | ICD-10-CM

## 2023-09-28 DIAGNOSIS — D1801 Hemangioma of skin and subcutaneous tissue: Secondary | ICD-10-CM

## 2023-09-28 DIAGNOSIS — W908XXA Exposure to other nonionizing radiation, initial encounter: Secondary | ICD-10-CM | POA: Diagnosis not present

## 2023-09-28 DIAGNOSIS — L821 Other seborrheic keratosis: Secondary | ICD-10-CM | POA: Diagnosis not present

## 2023-09-28 DIAGNOSIS — Z1283 Encounter for screening for malignant neoplasm of skin: Secondary | ICD-10-CM | POA: Diagnosis not present

## 2023-09-28 DIAGNOSIS — D229 Melanocytic nevi, unspecified: Secondary | ICD-10-CM

## 2023-09-28 DIAGNOSIS — L578 Other skin changes due to chronic exposure to nonionizing radiation: Secondary | ICD-10-CM

## 2023-09-28 DIAGNOSIS — Z85828 Personal history of other malignant neoplasm of skin: Secondary | ICD-10-CM

## 2023-09-28 MED ORDER — TRAMADOL HCL 50 MG PO TABS: 50.0000 mg | ORAL_TABLET | Freq: Three times a day (TID) | ORAL | 0 refills | Status: DC

## 2023-09-28 NOTE — Patient Instructions (Signed)
 Important Information  Due to recent changes in healthcare laws, you may see results of your pathology and/or laboratory studies on MyChart before the doctors have had a chance to review them. We understand that in some cases there may be results that are confusing or concerning to you. Please understand that not all results are received at the same time and often the doctors may need to interpret multiple results in order to provide you with the best plan of care or course of treatment. Therefore, we ask that you please give us  2 business days to thoroughly review all your results before contacting the office for clarification. Should we see a critical lab result, you will be contacted sooner.   If You Need Anything After Your Visit  If you have any questions or concerns for your doctor, please call our main line at [phone] If no one answers, please leave a voicemail as directed and we will return your call as soon as possible. Messages left after 4 pm will be answered the following business day.   You may also send us  a message via MyChart. We typically respond to MyChart messages within 1-2 business days.  For prescription refills, please ask your pharmacy to contact our office. Our fax number is [phone].  If you have an urgent issue when the clinic is closed that cannot wait until the next business day, you can page your doctor at the number below.    Please note that while we do our best to be available for urgent issues outside of office hours, we are not available 24/7.   If you have an urgent issue and are unable to reach us , you may choose to seek medical care at your doctor's office, retail clinic, urgent care center, or emergency room.  If you have a medical emergency, please immediately call 911 or go to the emergency department. In the event of inclement weather, please call our main line at [phone] for an update on the status of any delays or  closures.  Dermatology Medication Tips: Please keep the boxes that topical medications come in in order to help keep track of the instructions about where and how to use these. Pharmacies typically print the medication instructions only on the boxes and not directly on the medication tubes.   If your medication is too expensive, please contact our office at [phone] or send us  a message through MyChart.   We are unable to tell what your co-pay for medications will be in advance as this is different depending on your insurance coverage. However, we may be able to find a substitute medication at lower cost or fill out paperwork to get insurance to cover a needed medication.   If a prior authorization is required to get your medication covered by your insurance company, please allow us  1-2 business days to complete this process.  Drug prices often vary depending on where the prescription is filled and some pharmacies may offer cheaper prices.  The website www.goodrx.com contains coupons for medications through different pharmacies. The prices here do not account for what the cost may be with help from insurance (it may be cheaper with your insurance), but the website can give you the price if you did not use any insurance.  - You can print the associated coupon and take it with your prescription to the pharmacy.  - You may also stop by our office during regular business hours and pick up a GoodRx coupon card.  - If  you need your prescription sent electronically to a different pharmacy, notify our office through [facility] or by phone at [phone]    Skin Education :   I counseled the patient regarding the following: Sun screen (SPF 30 or greater) should be applied during peak UV exposure (between 10am and 2pm) and reapplied after exercise or swimming.  The ABCDEs of melanoma were reviewed with the patient, and the importance of monthly self-examination of moles was emphasized.  Should any moles change in shape or color, or itch, bleed or burn, pt will contact our office for evaluation sooner then their interval appointment.  Plan: Sunscreen Recommendations I recommended a broad spectrum sunscreen with a SPF of 30 or higher. I explained that SPF 30 sunscreens block approximately 97 percent of the sun's harmful rays. Sunscreens should be applied at least 15 minutes prior to expected sun exposure and then every 2 hours after that as long as sun exposure continues. If swimming or exercising sunscreen should be reapplied every 45 minutes to an hour after getting wet or sweating. One ounce, or the equivalent of a shot glass full of sunscreen, is adequate to protect the skin not covered by a bathing suit. I also recommended a lip balm with a sunscreen as well. Sun protective clothing can be used in lieu of sunscreen but must be worn the entire time you are exposed to the sun's rays.  Patient Handout: Wound Care for Skin Biopsy Site  Taking Care of Your Skin Biopsy Site  Proper care of the biopsy site is essential for promoting healing and minimizing scarring. This handout provides instructions on how to care for your biopsy site to ensure optimal recovery.  1. Cleaning the Wound:  Clean the biopsy site daily with gentle soap and water. Gently pat the area dry with a clean, soft towel. Avoid harsh scrubbing or rubbing the area, as this can irritate the skin and delay healing.  2. Applying Aquaphor and Bandage:  After cleaning the wound, apply a thin layer of Aquaphor ointment to the biopsy site. Cover the area with a sterile bandage to protect it from dirt, bacteria, and friction. Change the bandage daily or as needed if it becomes soiled or wet.  3. Continued Care for One Week:  Repeat the cleaning, Aquaphor application, and bandaging process daily for one week following the biopsy procedure. Keeping the wound clean and moist during this initial healing period will help  prevent infection and promote optimal healing.  4. Massaging Aquaphor into the Area:  ---After one week, discontinue the use of bandages but continue to apply Aquaphor to the biopsy site. ----Gently massage the Aquaphor into the area using circular motions. ---Massaging the skin helps to promote circulation and prevent the formation of scar tissue.   Additional Tips:  Avoid exposing the biopsy site to direct sunlight during the healing process, as this can cause hyperpigmentation or worsen scarring. If you experience any signs of infection, such as increased redness, swelling, warmth, or drainage from the wound, contact your healthcare provider immediately. Follow any additional instructions provided by your healthcare provider for caring for the biopsy site and managing any discomfort. Conclusion:  Taking proper care of your skin biopsy site is crucial for ensuring optimal healing and minimizing scarring. By following these instructions for cleaning, applying Aquaphor, and massaging the area, you can promote a smooth and successful recovery. If you have any questions or concerns about caring for your biopsy site, don't hesitate to contact your healthcare  provider for guidance.

## 2023-09-28 NOTE — Telephone Encounter (Signed)
 Last seen on 09/15/23 Follow up scheduled on 04/19/24   Dispensed Days Supply Quantity Provider Pharmacy  TRAMADOL  50MG  TAB 07/20/2023 30 90 each Sater, Charlie LABOR, MD [facility]...    Rx pending to be signed

## 2023-09-28 NOTE — Progress Notes (Signed)
 Follow-Up Visit   Subjective  Connie Haynes is a 68 y.o. female who presents for the following: Skin Cancer Screening and Full Body Skin Exam  The patient presents for Total-Body Skin Exam (TBSE) for skin cancer screening and mole check. The patient has spots, moles and lesions to be evaluated, some may be new or changing.  The following portions of the chart were reviewed this encounter and updated as appropriate: medications, allergies, medical history  Review of Systems:  No other skin or systemic complaints except as noted in HPI or Assessment and Plan.  Objective  Well appearing patient in no apparent distress; mood and affect are within normal limits.  A full examination was performed including scalp, head, eyes, ears, nose, lips, neck, chest, axillae, abdomen, back, buttocks, bilateral upper extremities, bilateral lower extremities, hands, feet, fingers, toes, fingernails, and toenails. All findings within normal limits unless otherwise noted below.   Relevant physical exam findings are noted in the Assessment and Plan.    Assessment & Plan   SKIN CANCER SCREENING PERFORMED TODAY.  ACTINIC DAMAGE - Chronic condition, secondary to cumulative UV/sun exposure - diffuse scaly erythematous macules with underlying dyspigmentation - Recommend daily broad spectrum sunscreen SPF 30+ to sun-exposed areas, reapply every 2 hours as needed.  - Staying in the shade or wearing long sleeves, sun glasses (UVA+UVB protection) and wide brim hats (4-inch brim around the entire circumference of the hat) are also recommended for sun protection.  - Call for new or changing lesions.  LENTIGINES, SEBORRHEIC KERATOSES, HEMANGIOMAS - Benign normal skin lesions - Benign-appearing - Call for any changes  MELANOCYTIC NEVI - Tan-brown and/or pink-flesh-colored symmetric macules and papules - Benign appearing on exam today - Observation - Call clinic for new or changing moles - Recommend daily use  of broad spectrum spf 30+ sunscreen to sun-exposed areas.   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  - Call if any new or changing lesions are noted between office visits   ACTINIC KERATOSIS Exam: Erythematous thin papules/macules with gritty scale  Actinic keratoses are precancerous spots that appear secondary to cumulative UV radiation exposure/sun exposure over time. They are chronic with expected duration over 1 year. A portion of actinic keratoses will progress to squamous cell carcinoma of the skin. It is not possible to reliably predict which spots will progress to skin cancer and so treatment is recommended to prevent development of skin cancer.  Recommend daily broad spectrum sunscreen SPF 30+ to sun-exposed areas, reapply every 2 hours as needed.  Recommend staying in the shade or wearing long sleeves, sun glasses (UVA+UVB protection) and wide brim hats (4-inch brim around the entire circumference of the hat). Call for new or changing lesions.  Treatment Plan:  Prior to procedure, discussed risks of blister formation, small wound, skin dyspigmentation, or rare scar following cryotherapy. Recommend Vaseline ointment to treated areas while healing.  Destruction Procedure Note Destruction method: cryotherapy   Informed consent: discussed and consent obtained   Lesion destroyed using liquid nitrogen: Yes   Outcome: patient tolerated procedure well with no complications   Post-procedure details: wound care instructions given   Locations: right posterior shoulder  # of Lesions Treated: 1    Return in about 1 year (around 09/27/2024) for TBSC in 6-12 months.  LILLETTE Kins  Court, Surg Tech III, am acting as scribe for RUFUS CHRISTELLA HOLY, MD.   Documentation: I have reviewed the above  documentation for accuracy and completeness, and I agree with the above.  RUFUS CHRISTELLA HOLY, MD

## 2023-09-29 ENCOUNTER — Encounter: Payer: Self-pay | Admitting: Family

## 2023-09-29 ENCOUNTER — Other Ambulatory Visit: Payer: Self-pay | Admitting: Neurology

## 2023-09-29 MED ORDER — TRAMADOL HCL 50 MG PO TABS: 50.0000 mg | ORAL_TABLET | Freq: Three times a day (TID) | ORAL | 5 refills | Status: DC

## 2023-10-04 ENCOUNTER — Other Ambulatory Visit: Payer: Self-pay | Admitting: Family

## 2023-10-04 ENCOUNTER — Ambulatory Visit: Payer: Self-pay | Admitting: Family

## 2023-10-04 DIAGNOSIS — E041 Nontoxic single thyroid nodule: Secondary | ICD-10-CM

## 2023-10-06 DIAGNOSIS — E041 Nontoxic single thyroid nodule: Secondary | ICD-10-CM | POA: Diagnosis not present

## 2023-10-14 DIAGNOSIS — E041 Nontoxic single thyroid nodule: Secondary | ICD-10-CM | POA: Diagnosis not present

## 2023-10-21 ENCOUNTER — Ambulatory Visit: Payer: Medicare Other | Admitting: Neurology

## 2023-10-22 ENCOUNTER — Encounter (INDEPENDENT_AMBULATORY_CARE_PROVIDER_SITE_OTHER): Payer: Self-pay | Admitting: Family Medicine

## 2023-10-24 NOTE — Progress Notes (Deleted)
 Assessment: 1. Frequent UTI   2. History of nephrolithiasis      Plan: No evidence of UTI today. I discussed possible causes of her left-sided back pain including nephrolithiasis. I reviewed the CT study from July 2024 which did not show any nephrolithiasis. Continue methods to reduce the risk of UTIs including increased fluid intake, timed and double voiding, daily cranberry supplement, daily probiotics, use of vaginal hormone cream. Continue vaginal hormone cream 2-3 times/week.  Continue fosfomycin 3 g every 10 days. Will schedule for CT renal stone protocol for evaluation of her back pain and history of nephrolithiasis.   Will contact her with results and to arrange follow-up.  Chief Complaint:  No chief complaint on file.   History of Present Illness:  Connie Haynes is a 68 y.o. female who is seen for further evaluation of frequent UTI's and history of nephrolithiasis. She has been treated for frequent UTIs.  No typical UTI symptoms.  No dysuria or gross hematuria.  No fevers, chills, or flank pain.  She does occasionally notice some cloudy urine with a strong odor.  She was last treated for a UTI in May 2024.  Urine culture results: 6/21 50-100K E. Coli 6/22 <10K mixed flora 11/22 >100K Klebsiella 1/24 50-100K Enterobacter 3/24 <10K mixed flora 4/24 Mixed flora 5/24 10-49K Enterococcus 6/24 Mixed flora  She is unaware of the need to void.  She does not have a sensation of bladder fullness.  She voids on a schedule both daytime and nighttime.  She is not sure if she empties her bladder completely.  She has occasional episodes of incontinence without awareness.  These symptoms have been present since her diagnosis of transverse myelitis in 2021.  She has a history of kidney stones.  She required stent placement for a infected stone in 2015.  She reports passing a stone in June 2024.  PVR = 21 ml. Resolve MDX urine culture from 08/04/2022 grew Enterococcus and Klebsiella.   She was treated with Augmentin  x 7 days. CT abdomen and pelvis without contrast from 08/21/2022 showed no renal or ureteral calculi, no evidence of obstruction or other urologic abnormalities.  She noted some improvement in her voiding symptoms after completing the antibiotics.  She resumed her regular voiding symptoms with hesitancy and sensation of incomplete emptying.  She also noted a decreased stream.  She does not have a awareness of her need to void so she continues to void on a schedule.  No dysuria or gross hematuria.  No flank pain.  No fevers or chills. Urine culture from 09/01/2022 grew 50-100 K Enterobacter.  She was treated with Bactrim  and started on daily Macrobid  for UTI prevention. She discontinued the Macrobid  due to nausea and vomiting. At her visit in August 2024, she was doing well without any new urinary symptoms.  No UTI symptoms. She continued on Premarin  vaginal cream and was started on methenamine  twice daily. At her visit in September 2024, she continued on methenamine  and was doing well.  She had not had any recent UTI symptoms.  She had discontinued the methenamine  for approximately 1 month due to GI symptoms with the medication. She developed flank pain in early November 2024 and was concerned about a possible UTI.  Resolve MDX urine culture grew Klebsiella.  She was treated with Levaquin  x 7 days.  Her flank pain resolved.  No other symptoms. She tried to resume the methenamine  but continued to have GI symptoms.  She was treated for UTI symptoms  with Levaquin  in 12/24.  She was also started on fosfomycin every 10 days.   She reported some low back pain on the left which she thought was a possible UTI in early January 2025.  She was treated with 5 days of Levaquin  for possible UTI.  At her visit in January 2025, she had completed the Levaquin .  She was  on the fosfomycin every 10 days.  She continued to have fairly constant pain in the left lower back area with radiation  into the left lower quadrant.  She reported passing a small kidney stone.  No dysuria or gross hematuria.  No fevers or chills.   Portions of the above documentation were copied from a prior visit for review purposes only.   Past Medical History:  Past Medical History:  Diagnosis Date   A-fib (HCC)    Adrenal insufficiency (HCC)    Anemia    denies   Anxiety    Basal cell carcinoma    eye lid   Cataract    Chronic kidney disease, stage 3 (HCC)    Cough    hx. dry cough chronic   Disorder of tear duct system    frequent tearing of eyes   GERD (gastroesophageal reflux disease)    H/O: whooping cough 2000   History of kidney infection 2015   ? sepsis   History of kidney stones    History of kidney stones    Hyperlipidemia    Hypertension    IBS (irritable bowel syndrome)    controlled with med   Osteoarthritis    arthritis -joints-limited ROM shoulders more on right   PONV (postoperative nausea and vomiting)     per pt hx of low BP following shoulder surgery in 05-2015; was managed in hospital with IV fluids and d/c BP being taken a tthe time; pt was dced and F/U with endcrinoopgist who dx with adrenal insurffiency and started steroid therapy; treatmetnt with positive outome, now managed chronically on hydrocortisone  tablets;   Sleep apnea    no longer using cpap   Stroke (HCC)    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 Windy)   APPENDECTOMY     CARDIAC ELECTROPHYSIOLOGY STUDY AND ABLATION     '93 for WPW syndrome   CARPAL  TUNNEL RELEASE Right 12/21/2014   Procedure: RIGHT CARPAL TUNNEL RELEASE ;  Surgeon: Marcey Her, MD;  Location: [facility] OR;  Service: Orthopedics;  Laterality: Right;   CARPAL TUNNEL RELEASE Left    CERVICAL SPINE SURGERY     cervical fusion with hardware retained   COLON SURGERY     colon detached from abdomen   COLONOSCOPY WITH PROPOFOL  N/A 05/10/2014   Procedure: COLONOSCOPY WITH PROPOFOL ;  Surgeon: Renaye Sous, MD;  Location: [facility];  Service: Endoscopy;  Laterality: N/A;   CYSTOSCOPY/RETROGRADE/URETEROSCOPY Left 12/18/2013   Procedure: CYSTOSCOPY/RETROGRADE/LEFT STENT;  Surgeon: Norleen JINNY Seltzer, MD;  Location: [facility];  Service: Urology;  Laterality: Left;   ESOPHAGOGASTRODUODENOSCOPY (EGD) WITH PROPOFOL  N/A 05/10/2014  Procedure: ESOPHAGOGASTRODUODENOSCOPY (EGD) WITH PROPOFOL ;  Surgeon: Renaye Sous, MD;  Location: [facility];  Service: Endoscopy;  Laterality: N/A;   EYE SURGERY     EYELID CARCINOMA EXCISION     INSERTION OF DIALYSIS CATHETER N/A 08/16/2019   Procedure: INSERTION OF DIALYSIS CATHETER;  Surgeon: Brenna Adine CROME, DO;  Location: [facility];  Service: Pulmonary;  Laterality: N/A;   JOINT REPLACEMENT     KNEE ARTHROSCOPY Left 07/16/2016   REVERSE SHOULDER ARTHROPLASTY Right 06/29/2014   Procedure: REVERSE TOTAL SHOULDER ARTHROPLASTY;  Surgeon: Marcey Her, MD;  Location: [facility] OR;  Service: Orthopedics;  Laterality: Right;   REVISION TOTAL SHOULDER TO REVERSE TOTAL SHOULDER Left 05/15/2015   REVISION TOTAL SHOULDER TO REVERSE TOTAL SHOULDER Left 05/15/2015   Procedure: LEFT SHOULDER REVISION TOTAL SHOULDER ARTHRPLASTY TO REVERSE TOTAL SHOULDER ARTHROPLASTY ;  Surgeon: Marcey Her, MD;  Location: [facility] OR;  Service: Orthopedics;  Laterality: Left;   SPINE SURGERY     tear duct surgery     TOTAL ABDOMINAL HYSTERECTOMY     TOTAL HIP ARTHROPLASTY     x 3(rt x2, lt x1)   TOTAL KNEE ARTHROPLASTY Right 08/08/2013   Procedure: RIGHT TOTAL KNEE ARTHROPLASTY WITH SAPHENOUS  NERVE SCAR EXCISION;  Surgeon: Donnice JONETTA Car, MD;  Location: [facility];  Service: Orthopedics;  Laterality: Right;   TOTAL KNEE ARTHROPLASTY Left 08/25/2016   Procedure: LEFT TOTAL KNEE ARTHROPLASTY;  Surgeon: Car Donnice, MD;  Location: [facility];  Service: Orthopedics;  Laterality: Left;  70 mins; Adductor block   TOTAL SHOULDER ARTHROPLASTY Left 07/15/2012   Procedure: LEFT TOTAL SHOULDER ARTHROPLASTY;  Surgeon: [name], MD;  Location: [facility] OR;  Service: Orthopedics;  Laterality: Left;   TOTAL SHOULDER REVISION Right 12/21/2014   Procedure: RIGHT SHOULDER POLY EXCHANGE ;  Surgeon: Marcey Her, MD;  Location: [facility] OR;  Service: Orthopedics;  Laterality: Right;   URETEROSCOPY  stent placement    Allergies:  Allergies  Allergen Reactions   Codeine Nausea And Vomiting    Can take if takes phernergan   Hydrocodone  Nausea Only   Lamotrigine      dizziness, shakiness and just feeling not right.   Macrobid  [Nitrofurantoin ] Diarrhea and Nausea And Vomiting   Robaxin  [Methocarbamol ] Rash    Family History:  Family History  Problem Relation Age of Onset   Heart disease Mother    Hypertension Mother    Hyperlipidemia Mother    Heart attack Mother    Heart failure Mother    Leukemia Brother    Anxiety disorder Brother    Cancer Maternal Uncle        bone   Heart disease Maternal Grandfather    Heart attack Maternal Grandfather    Cancer Paternal Grandfather    Lung cancer Paternal Grandfather    COPD Brother    Seizures Neg Hx     Social History:  Social History   Tobacco Use   Smoking status: Never   Smokeless tobacco: Never  Vaping Use   Vaping status: Never Used  Substance Use Topics   Alcohol use: No    Alcohol/week: 0.0 standard drinks of alcohol   Drug use: No    ROS: Constitutional:  Negative for fever, chills, weight loss CV: Negative for chest pain, previous MI, hypertension Respiratory:  Negative for shortness of breath, wheezing, sleep apnea, frequent  cough GI:  Negative for nausea, vomiting, bloody stool, GERD  Physical exam: There were no vitals taken for this visit. GENERAL APPEARANCE:  Well appearing, well  developed, well nourished, NAD HEENT:  Atraumatic, normocephalic, oropharynx clear NECK:  Supple without lymphadenopathy or thyromegaly ABDOMEN:  Soft, non-tender, no masses EXTREMITIES:  Moves all extremities well, without clubbing, cyanosis, or edema NEUROLOGIC:  Alert and oriented x 3, normal gait, CN II-XII grossly intact MENTAL STATUS:  appropriate BACK:  Non-tender to palpation, No CVAT SKIN:  Warm, dry, and intact   Results: U/A:

## 2023-10-25 ENCOUNTER — Ambulatory Visit: Admitting: Urology

## 2023-10-25 ENCOUNTER — Encounter: Payer: Self-pay | Admitting: Urology

## 2023-10-25 ENCOUNTER — Ambulatory Visit (INDEPENDENT_AMBULATORY_CARE_PROVIDER_SITE_OTHER): Admitting: Urology

## 2023-10-25 ENCOUNTER — Institutional Professional Consult (permissible substitution) (INDEPENDENT_AMBULATORY_CARE_PROVIDER_SITE_OTHER): Admitting: Family Medicine

## 2023-10-25 VITALS — BP 120/74 | HR 60

## 2023-10-25 DIAGNOSIS — Z87442 Personal history of urinary calculi: Secondary | ICD-10-CM | POA: Diagnosis not present

## 2023-10-25 DIAGNOSIS — N200 Calculus of kidney: Secondary | ICD-10-CM

## 2023-10-25 DIAGNOSIS — R8290 Unspecified abnormal findings in urine: Secondary | ICD-10-CM

## 2023-10-25 DIAGNOSIS — N390 Urinary tract infection, site not specified: Secondary | ICD-10-CM | POA: Diagnosis not present

## 2023-10-25 LAB — URINALYSIS, ROUTINE W REFLEX MICROSCOPIC
Bilirubin, UA: NEGATIVE
Glucose, UA: NEGATIVE
Ketones, UA: NEGATIVE
Nitrite, UA: NEGATIVE
Protein,UA: NEGATIVE
RBC, UA: NEGATIVE
Specific Gravity, UA: 1.025 (ref 1.005–1.030)
Urobilinogen, Ur: 0.2 mg/dL (ref 0.2–1.0)
pH, UA: 5.5 (ref 5.0–7.5)

## 2023-10-25 LAB — MICROSCOPIC EXAMINATION

## 2023-10-25 NOTE — Progress Notes (Signed)
 Assessment: 1. Frequent UTI   2. Nephrolithiasis   3. Abnormal urine findings     Plan: Continue methods to reduce the risk of UTIs including increased fluid intake, timed and double voiding, daily cranberry supplement, daily probiotics, use of vaginal hormone cream. Continue vaginal hormone cream 2-3 times/week.  Continue fosfomycin 3 g every 10 days. Urine culture sent today Return to office in 6 months  Chief Complaint:  Chief Complaint  Patient presents with   Frequent UTI    History of Present Illness:  Connie Haynes is a 68 y.o. female who is seen for further evaluation of frequent UTI's and history of nephrolithiasis. She has been treated for frequent UTIs.  No typical UTI symptoms.  No dysuria or gross hematuria.  No fevers, chills, or flank pain.  She does occasionally notice some cloudy urine with a strong odor.  She was last treated for a UTI in May 2024.  Urine culture results: 6/21 50-100K E. Coli 6/22 <10K mixed flora 11/22 >100K Klebsiella 1/24 50-100K Enterobacter 3/24 <10K mixed flora 4/24 Mixed flora 5/24 10-49K Enterococcus 6/24 Mixed flora  She is unaware of the need to void.  She does not have a sensation of bladder fullness.  She voids on a schedule both daytime and nighttime.  She is not sure if she empties her bladder completely.  She has occasional episodes of incontinence without awareness.  These symptoms have been present since her diagnosis of transverse myelitis in 2021.  She has a history of kidney stones.  She required stent placement for a infected stone in 2015.  She reports passing a stone in June 2024.  PVR = 21 ml. Resolve MDX urine culture from 08/04/2022 grew Enterococcus and Klebsiella.  She was treated with Augmentin  x 7 days. CT abdomen and pelvis without contrast from 08/21/2022 showed no renal or ureteral calculi, no evidence of obstruction or other urologic abnormalities.  She noted some improvement in her voiding symptoms after  completing the antibiotics.  She resumed her regular voiding symptoms with hesitancy and sensation of incomplete emptying.  She also noted a decreased stream.  She does not have a awareness of her need to void so she continues to void on a schedule.  No dysuria or gross hematuria.  No flank pain.  No fevers or chills. Urine culture from 09/01/2022 grew 50-100 K Enterobacter.  She was treated with Bactrim  and started on daily Macrobid  for UTI prevention. She discontinued the Macrobid  due to nausea and vomiting. At her visit in August 2024, she was doing well without any new urinary symptoms.  No UTI symptoms. She continued on Premarin  vaginal cream and was started on methenamine  twice daily. At her visit in September 2024, she continued on methenamine  and was doing well.  She had not had any recent UTI symptoms.  She had discontinued the methenamine  for approximately 1 month due to GI symptoms with the medication. She developed flank pain in early November 2024 and was concerned about a possible UTI.  Resolve MDX urine culture grew Klebsiella.  She was treated with Levaquin  x 7 days.  Her flank pain resolved.  No other symptoms. She tried to resume the methenamine  but continued to have GI symptoms.  She was treated for UTI symptoms with Levaquin  in 12/24.  She was also started on fosfomycin every 10 days.   She reported some low back pain on the left which she thought was a possible UTI in early January 2025.  She was  treated with 5 days of Levaquin  for possible UTI.  At her visit in January 2025, she had completed the Levaquin .  She was  on the fosfomycin every 10 days.  She continued to have fairly constant pain in the left lower back area with radiation into the left lower quadrant.  She reported passing a small kidney stone.  No dysuria or gross hematuria.  No fevers or chills.  She returns today for follow-up.  She continues on fosfomycin every 10 days for UTI prevention.  She has not had any  significant UTI symptoms.  She reports occasional bladder symptoms which resolve.  She reports passing a stone after her last visit.  She has not had any additional flank pain or lower abdominal pain.  No dysuria or gross hematuria.   Portions of the above documentation were copied from a prior visit for review purposes only.   Past Medical History:  Past Medical History:  Diagnosis Date   A-fib (HCC)    Adrenal insufficiency (HCC)    Anemia    denies   Anxiety    Basal cell carcinoma    eye lid   Cataract    Chronic kidney disease, stage 3 (HCC)    Cough    hx. dry cough chronic   Disorder of tear duct system    frequent tearing of eyes   GERD (gastroesophageal reflux disease)    H/O: whooping cough 2000   History of kidney infection 2015   ? sepsis   History of kidney stones    History of kidney stones    Hyperlipidemia    Hypertension    IBS (irritable bowel syndrome)    controlled with med   Osteoarthritis    arthritis -joints-limited ROM shoulders more on right   PONV (postoperative nausea and vomiting)     per pt hx of low BP following shoulder surgery in 05-2015; was managed in hospital with IV fluids and d/c BP being taken a tthe time; pt was dced and F/U with endcrinoopgist who dx with adrenal insurffiency and started steroid therapy; treatmetnt with positive outome, now managed chronically on hydrocortisone  tablets;   Sleep apnea    no longer using cpap   Stroke (HCC)    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 Windy)    APPENDECTOMY     CARDIAC ELECTROPHYSIOLOGY STUDY AND ABLATION     '93 for WPW syndrome   CARPAL TUNNEL RELEASE Right 12/21/2014   Procedure: RIGHT CARPAL TUNNEL RELEASE ;  Surgeon: Marcey Her, MD;  Location: [facility] OR;  Service: Orthopedics;  Laterality: Right;   CARPAL TUNNEL RELEASE Left    CERVICAL SPINE SURGERY     cervical fusion with hardware retained   COLON SURGERY     colon detached from abdomen   COLONOSCOPY WITH PROPOFOL  N/A 05/10/2014   Procedure: COLONOSCOPY WITH PROPOFOL ;  Surgeon: Renaye Sous, MD;  Location: [facility];  Service: Endoscopy;  Laterality: N/A;   CYSTOSCOPY/RETROGRADE/URETEROSCOPY Left 12/18/2013   Procedure: CYSTOSCOPY/RETROGRADE/LEFT STENT;  Surgeon:  Norleen JINNY Seltzer, MD;  Location: [facility];  Service: Urology;  Laterality: Left;   ESOPHAGOGASTRODUODENOSCOPY (EGD) WITH PROPOFOL  N/A 05/10/2014   Procedure: ESOPHAGOGASTRODUODENOSCOPY (EGD) WITH PROPOFOL ;  Surgeon: Renaye Sous, MD;  Location: [facility];  Service: Endoscopy;  Laterality: N/A;   EYE SURGERY     EYELID CARCINOMA EXCISION     INSERTION OF DIALYSIS CATHETER N/A 08/16/2019   Procedure: INSERTION OF DIALYSIS CATHETER;  Surgeon: Brenna Adine CROME, DO;  Location: [facility];  Service: Pulmonary;  Laterality: N/A;   JOINT REPLACEMENT     KNEE ARTHROSCOPY Left 07/16/2016   REVERSE SHOULDER ARTHROPLASTY Right 06/29/2014   Procedure: REVERSE TOTAL SHOULDER ARTHROPLASTY;  Surgeon: Marcey Her, MD;  Location: [facility] OR;  Service: Orthopedics;  Laterality: Right;   REVISION TOTAL SHOULDER TO REVERSE TOTAL SHOULDER Left 05/15/2015   REVISION TOTAL SHOULDER TO REVERSE TOTAL SHOULDER Left 05/15/2015   Procedure: LEFT SHOULDER REVISION TOTAL SHOULDER ARTHRPLASTY TO REVERSE TOTAL SHOULDER ARTHROPLASTY ;  Surgeon: Marcey Her, MD;  Location: [facility] OR;  Service: Orthopedics;  Laterality: Left;   SPINE SURGERY     tear duct surgery     TOTAL ABDOMINAL HYSTERECTOMY     TOTAL HIP ARTHROPLASTY     x 3(rt x2, lt x1)    TOTAL KNEE ARTHROPLASTY Right 08/08/2013   Procedure: RIGHT TOTAL KNEE ARTHROPLASTY WITH SAPHENOUS NERVE SCAR EXCISION;  Surgeon: Donnice JONETTA Car, MD;  Location: [facility];  Service: Orthopedics;  Laterality: Right;   TOTAL KNEE ARTHROPLASTY Left 08/25/2016   Procedure: LEFT TOTAL KNEE ARTHROPLASTY;  Surgeon: Car Donnice, MD;  Location: [facility];  Service: Orthopedics;  Laterality: Left;  70 mins; Adductor block   TOTAL SHOULDER ARTHROPLASTY Left 07/15/2012   Procedure: LEFT TOTAL SHOULDER ARTHROPLASTY;  Surgeon: [name], MD;  Location[facility] OR;  Service: Orthopedics;  Laterality: Left;   TOTAL SHOULDER REVISION Right 12/21/2014   Procedure: RIGHT SHOULDER POLY EXCHANGE ;  Surgeon: Marcey Her, MD;  Location: [facility] OR;  Service: Orthopedics;  Laterality: Right;   URETEROSCOPY  stent placement    Allergies:  Allergies  Allergen Reactions   Codeine Nausea And Vomiting    Can take if takes phernergan   Hydrocodone  Nausea Only   Lamotrigine      dizziness, shakiness and just feeling not right.   Macrobid  [Nitrofurantoin ] Diarrhea and Nausea And Vomiting   Robaxin  [Methocarbamol ] Rash    Family History:  Family History  Problem Relation Age of Onset   Heart disease Mother    Hypertension Mother    Hyperlipidemia Mother    Heart attack Mother    Heart failure Mother    Leukemia Brother    Anxiety disorder Brother    Cancer Maternal Uncle        bone   Heart disease Maternal Grandfather    Heart attack Maternal Grandfather    Cancer Paternal Grandfather    Lung cancer Paternal Grandfather    COPD Brother    Seizures Neg Hx     Social History:  Social History   Tobacco Use   Smoking status: Never   Smokeless tobacco: Never  Vaping Use   Vaping status: Never Used  Substance Use Topics   Alcohol use: No    Alcohol/week: 0.0 standard drinks of alcohol   Drug use: No    ROS: Constitutional:  Negative for fever, chills, weight loss CV: Negative for chest pain, previous  MI, hypertension Respiratory:  Negative for shortness of breath, wheezing, sleep apnea, frequent cough GI:  Negative for nausea, vomiting, bloody stool, GERD  Physical exam: BP 120/74   Pulse 60  GENERAL APPEARANCE:  Well appearing, well developed, well nourished, NAD HEENT:  Atraumatic, normocephalic, oropharynx clear NECK:  Supple without lymphadenopathy or thyromegaly ABDOMEN:  Soft, non-tender, no masses EXTREMITIES:  Moves all extremities well, without clubbing, cyanosis, or edema NEUROLOGIC:  Alert and oriented x 3, normal gait, CN II-XII grossly intact MENTAL STATUS:  appropriate BACK:  Non-tender to palpation, No CVAT SKIN:  Warm, dry, and intact   Results: U/A: 6/10 WBCs, 0-2 RBCs, moderate bacteria

## 2023-10-27 ENCOUNTER — Ambulatory Visit: Payer: Self-pay | Admitting: Urology

## 2023-10-27 LAB — URINE CULTURE

## 2023-10-29 ENCOUNTER — Telehealth: Payer: Self-pay

## 2023-10-29 DIAGNOSIS — I700 Atherosclerosis of aorta: Secondary | ICD-10-CM

## 2023-10-29 DIAGNOSIS — I10 Essential (primary) hypertension: Secondary | ICD-10-CM

## 2023-10-29 NOTE — Telephone Encounter (Signed)
 Copied from CRM [id number]. Topic: Clinical - Lab/Test Results >> Oct 29, 2023 12:50 PM Viola F wrote: Reason for CRM: Patient scheduled transfer of care appt with [facility] 01/05/24 - she would like a call to discuss CT results from 09/20/23. She has some concerns and questions. Please call [phone] (M)

## 2023-11-01 NOTE — Telephone Encounter (Signed)
 Discussed CT with patient and answered all questions. She will call with any further problems before appt.

## 2023-11-20 ENCOUNTER — Ambulatory Visit
Admission: EM | Admit: 2023-11-20 | Discharge: 2023-11-20 | Disposition: A | Discharge status: Home / Self Care | Attending: Internal Medicine | Admitting: Internal Medicine

## 2023-11-20 DIAGNOSIS — L97822 Non-pressure chronic ulcer of other part of left lower leg with fat layer exposed: Secondary | ICD-10-CM | POA: Diagnosis not present

## 2023-11-20 DIAGNOSIS — R600 Localized edema: Secondary | ICD-10-CM | POA: Diagnosis not present

## 2023-11-20 DIAGNOSIS — I700 Atherosclerosis of aorta: Secondary | ICD-10-CM | POA: Diagnosis not present

## 2023-11-20 MED ORDER — MUPIROCIN 2 % EX OINT: 1.0000 | TOPICAL_OINTMENT | Freq: Two times a day (BID) | CUTANEOUS | 1 refills | Status: DC

## 2023-11-20 NOTE — ED Triage Notes (Signed)
 Pt reports open wound in the left lower leg x 3 months.States started like a mosquito bite. States is  getting bigger and deeper. Pt is using Neosporin and hydroperoxide.

## 2023-11-20 NOTE — Discharge Instructions (Addendum)
 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  There does not appear to be any infection present.  Although pulses in the left foot are palpable, they are difficult to palpate which could be due to edema but would like to check to make sure that the arterial flow into the leg is adequate for healing.  We recommend the following: Mupirocin ointment twice daily to the affected area until healed.  Use a gauze pad or Telfa pad over the area and keep covered day and night.  Do not use a Band-Aid on the area. Do not wash the area with peroxide or use Neosporin.  Recommend washing it with soap and water  twice daily Make sure to elevate your legs during the day to help with swelling We will send order for ankle brachial indexes which is an ultrasound that will check the blood flow into your legs.  They will contact you from the ultrasound department to schedule this We have placed a referral to the wound care center at [facility] however you will need to call them next week to schedule an appointment. If you feel the area is worsening and are unable to get an appointment with the wound care clinic then recommend returning to urgent care

## 2023-11-20 NOTE — ED Provider Notes (Signed)
 [facility]    CSN: [id number] Arrival date & time: 11/20/23  1159      History   Chief Complaint Chief Complaint  Patient presents with   Wound Check    HPI Connie Haynes is a 68 y.o. female.   68 year old female who presents to urgent care with complaints of a left lower extremity ulceration that is not healing.  She reports that this started as a red bump like a mosquito bite on her left shin but has progressively worsened.  At times it does look like it is infected but then it will resolve.  The area is tender at times.  She denies any history of having poor healing on her lower extremities.  She does not have diabetes.  She does not have any history of peripheral arterial disease in the lower extremities but does have some chronic lower extremity edema.  She is currently on Rituxan.  She denies any fevers or chills at current.  She has been cleaning the area with peroxide daily and using Neosporin.   Wound Check Pertinent negatives include no chest pain, no abdominal pain and no shortness of breath.    Past Medical History:  Diagnosis Date   A-fib [facility])    Adrenal insufficiency    Anemia    denies   Anxiety    Basal cell carcinoma    eye lid   Cataract    Chronic kidney disease, stage 3 (HCC)    Cough    hx. dry cough chronic   Disorder of tear duct system    frequent tearing of eyes   GERD (gastroesophageal reflux disease)    H/O: whooping cough 2000   History of kidney infection 2015   ? sepsis   History of kidney stones    History of kidney stones    Hyperlipidemia    Hypertension    IBS (irritable bowel syndrome)    controlled with med   Osteoarthritis    arthritis -joints-limited ROM shoulders more on right   PONV (postoperative nausea and vomiting)     per pt hx of low BP following shoulder surgery in 05-2015; was managed in hospital with IV fluids and d/c BP being taken a tthe time; pt was dced and F/U with endcrinoopgist who dx with adrenal  insurffiency and started steroid therapy; treatmetnt with positive outome, now managed chronically on hydrocortisone  tablets;   Sleep apnea    no longer using cpap   Stroke [facility])    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  that were causing LLE tremors and weakneess; underwent physcal thereapy with return to normal ADLs with exception of occasional tremors in right hand    UTI (lower urinary tract infection)    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   had ablation done  and now managed on carvedilol      Patient Active Problem List   Diagnosis Date Noted   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   Long-term current use of immunomodulator 05/18/2022   Acid reflux 10/14/2021   Epigastric pain 10/14/2021   Decreased GFR 09/26/2021   Palpitations - suggestive of PVC's 06/24/2021   Abnormal urinalysis 03/21/2021   Acute cystitis with hematuria 12/20/2020   Age related osteoporosis 12/20/2020   Nocturnal hypoxia 12/20/2020   Post-menopausal 12/20/2020   Supplemental oxygen dependent 12/20/2020   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HCC) 10/30/2020   Encounter for immunotherapy 10/30/2020   History of optic neuritis 10/30/2020  Hypokalemia 09/19/2020   Urinary retention 07/17/2020   Encounter for monitoring immunomodulating therapy 05/02/2020   Vitamin D  deficiency 03/07/2020   Numbness 08/29/2019   Gait disturbance 08/29/2019   Abnormal brain MRI 08/29/2019   High risk medication use 08/29/2019   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HCC) 08/11/2019   Adrenal insufficiency (Addison's disease) (HCC) 08/11/2019   Neck pain 02/08/2019   CKD (chronic kidney disease) stage 2, GFR 60-89 ml/min 06/23/2017   Nausea and vomiting 05/05/2017   S/P left TKA 08/25/2016   S/P total knee replacement 08/25/2016   Imbalance 01/16/2016   Lumbar disc herniation with radiculopathy 01/16/2016   Lumbar facet arthropathy 01/16/2016   Low back pain 06/24/2015   Tremor 06/24/2015    Weakness of both lower extremities 06/24/2015   S/P shoulder replacement 06/29/2014   OSA (obstructive sleep apnea) 01/22/2014   Left ureteral stone 12/18/2013   Frequent UTI 12/18/2013   Morbid obesity (HCC) 08/09/2013   S/P right TKA 08/08/2013   Inflammation of eyelid, left 06/01/2013   Dry eye of left side 05/01/2013   Nasolacrimal duct obstruction, acquired 03/25/2013   Epiphora due to insufficient drainage 03/09/2013   Chronic pain syndrome 03/04/2013   Depressive disorder, not elsewhere classified 03/04/2013   Ectropion, senile 02/20/2013   Punctal stenosis 02/20/2013   Trichiasis of eyelid 02/20/2013   Need for prophylactic vaccination and inoculation against influenza 10/04/2012   Erosive osteoarthritis of multiple sites 10/04/2012   Chronic cough 10/04/2012   Swelling of limb 08/21/2012   Essential hypertension, benign 08/21/2012   Preoperative general physical examination 06/12/2012   Restless leg syndrome 06/12/2012   Anxiety 06/12/2012   Headache 04/04/2012   Sinusitis, chronic 04/04/2012   Blocked lacrimal duct 09/23/2011   Excessive tearing 09/23/2011   Lung nodule 07/16/2011   Cough 04/21/2011   Wolff-Parkinson-Amador Braddy (WPW) syndrome    Osteoarthritis    Hyperlipidemia    IBS (irritable bowel syndrome)    Nephrolithiasis     Past Surgical History:  Procedure Laterality Date   ablation surgery     1993 [facility] (Dr. Windy)   APPENDECTOMY     CARDIAC ELECTROPHYSIOLOGY STUDY AND ABLATION     '93 for WPW syndrome   CARPAL TUNNEL RELEASE Right 12/21/2014   Procedure: RIGHT CARPAL TUNNEL RELEASE ;  Surgeon: Marcey Her, MD;  Location: [facility] OR;  Service: Orthopedics;  Laterality: Right;   CARPAL TUNNEL RELEASE Left    CERVICAL SPINE SURGERY     cervical fusion with hardware retained   COLON SURGERY     colon detached from abdomen   COLONOSCOPY WITH PROPOFOL  N/A 05/10/2014   Procedure: COLONOSCOPY WITH PROPOFOL ;  Surgeon: Renaye Sous, MD;  Location: [facility]  ENDOSCOPY;  Service: Endoscopy;  Laterality: N/A;   CYSTOSCOPY/RETROGRADE/URETEROSCOPY Left 12/18/2013   Procedure: CYSTOSCOPY/RETROGRADE/LEFT STENT;  Surgeon: Norleen JINNY Seltzer, MD;  Location: [facility] ORS;  Service: Urology;  Laterality: Left;   ESOPHAGOGASTRODUODENOSCOPY (EGD) WITH PROPOFOL  N/A 05/10/2014   Procedure: ESOPHAGOGASTRODUODENOSCOPY (EGD) WITH PROPOFOL ;  Surgeon: Renaye Sous, MD;  Location: [facility] ENDOSCOPY;  Service: Endoscopy;  Laterality: N/A;   EYE SURGERY     EYELID CARCINOMA EXCISION     INSERTION OF DIALYSIS CATHETER N/A 08/16/2019   Procedure: INSERTION OF DIALYSIS CATHETER;  Surgeon: Brenna Adine CROME, DO;  Location: [facility] ENDOSCOPY;  Service: Pulmonary;  Laterality: N/A;   JOINT REPLACEMENT     KNEE ARTHROSCOPY Left 07/16/2016   REVERSE SHOULDER ARTHROPLASTY Right 06/29/2014   Procedure: REVERSE TOTAL SHOULDER ARTHROPLASTY;  Surgeon: Marcey Her, MD;  Location: [facility]  OR;  Service: Orthopedics;  Laterality: Right;   REVISION TOTAL SHOULDER TO REVERSE TOTAL SHOULDER Left 05/15/2015   REVISION TOTAL SHOULDER TO REVERSE TOTAL SHOULDER Left 05/15/2015   Procedure: LEFT SHOULDER REVISION TOTAL SHOULDER ARTHRPLASTY TO REVERSE TOTAL SHOULDER ARTHROPLASTY ;  Surgeon: Marcey Her, MD;  Location: [facility] OR;  Service: Orthopedics;  Laterality: Left;   SPINE SURGERY     tear duct surgery     TOTAL ABDOMINAL HYSTERECTOMY     TOTAL HIP ARTHROPLASTY     x 3(rt x2, lt x1)   TOTAL KNEE ARTHROPLASTY Right 08/08/2013   Procedure: RIGHT TOTAL KNEE ARTHROPLASTY WITH SAPHENOUS NERVE SCAR EXCISION;  Surgeon: Donnice JONETTA Car, MD;  Location: [facility] ORS;  Service: Orthopedics;  Laterality: Right;   TOTAL KNEE ARTHROPLASTY Left 08/25/2016   Procedure: LEFT TOTAL KNEE ARTHROPLASTY;  Surgeon: Car Donnice, MD;  Location: [facility]S;  Service: Orthopedics;  Laterality: Left;  70 mins; Adductor block   TOTAL SHOULDER ARTHROPLASTY Left 07/15/2012   Procedure: LEFT TOTAL SHOULDER ARTHROPLASTY;  Surgeon: [name], MD;   Location: [facility] OR;  Service: Orthopedics;  Laterality: Left;   TOTAL SHOULDER REVISION Right 12/21/2014   Procedure: RIGHT SHOULDER POLY EXCHANGE ;  Surgeon: Marcey Her, MD;  Location: [facility] OR;  Service: Orthopedics;  Laterality: Right;   URETEROSCOPY  stent placement    OB History   No obstetric history on file.      Home Medications    Prior to Admission medications   Medication Sig Start Date End Date Taking? Authorizing Provider  mupirocin ointment (BACTROBAN) 2 % Apply 1 Application topically 2 (two) times daily. 11/20/23  Yes Cadynce Garrette A, PA-C  carvedilol  (COREG ) 25 MG tablet Take 1 tablet by mouth twice daily 07/20/23   Lesia Ozell Barter, PA-C  citalopram  (CELEXA ) 40 MG tablet Take 1 tablet (40 mg total) by mouth daily. 09/10/23   Jason Leita Repine, FNP  fosfomycin (MONUROL ) 3 g PACK Take 3 g by mouth every 10 days 09/22/23   Roseann Adine PARAS., MD  furosemide  (LASIX ) 40 MG tablet TAKE 2 TABLETS BY MOUTH EVERY OTHER DAY 08/10/22   Fernande Elspeth BROCKS, MD  hydrocortisone  (CORTEF ) 10 MG tablet Take 5-10 mg by mouth See admin instructions. Take 10mg  in the AM and 5mg  in the afternoon.    [provider]  LINZESS  72 MCG capsule Take 1 capsule (72 mcg total) by mouth daily before breakfast. 09/10/23   Jason Leita Repine, FNP  LORazepam  (ATIVAN ) 0.5 MG tablet TAKE 1 TABLET BY MOUTH TWICE DAILY AS NEEDED 04/05/23   Sater, Charlie LABOR, MD  Multiple Vitamin (MULTIVITAMIN ADULT PO) Take 1 tablet by mouth daily.    [provider]  ondansetron  (ZOFRAN ) 4 MG tablet Take 4 mg by mouth every 8 (eight) hours as needed. 09/25/21   [provider]  potassium chloride  (KLOR-CON ) 10 MEQ tablet Take 10 mEq by mouth as needed. WITH LASIX  09/20/20   [provider]  pramipexole  (MIRAPEX ) 1 MG tablet TAKE 1 PO BID 10/20/22   Sater, Charlie LABOR, MD  predniSONE  (DELTASONE ) 20 MG tablet Take 3 pills po the day before infusions. 10/30/20   Sater, Charlie LABOR, MD   riTUXimab  (RITUXAN IV) Inject into the vein.    [provider]  telmisartan -hydrochlorothiazide  (MICARDIS  HCT) 80-12.5 MG tablet Take 1 tablet by mouth daily. 09/10/23   Jason Leita Repine, FNP  tiZANidine  (ZANAFLEX ) 4 MG tablet TAKE 1 TO 1 & 1/2 (ONE & ONE-HALF) TABLETS BY MOUTH  THREE TIMES DAILY AS NEEDED 09/15/23   Sater, Charlie LABOR, MD  traMADol  (ULTRAM ) 50 MG tablet Take 1 tablet (50 mg total) by mouth every 8 (eight) hours. 09/29/23   Sater, Charlie LABOR, MD    Family History Family History  Problem Relation Age of Onset   Heart disease Mother    Hypertension Mother    Hyperlipidemia Mother    Heart attack Mother    Heart failure Mother    Leukemia Brother    Anxiety disorder Brother    Cancer Maternal Uncle        bone   Heart disease Maternal Grandfather    Heart attack Maternal Grandfather    Cancer Paternal Grandfather    Lung cancer Paternal Grandfather    COPD Brother    Seizures Neg Hx     Social History Social History   Tobacco Use   Smoking status: Never   Smokeless tobacco: Never  Vaping Use   Vaping status: Never Used  Substance Use Topics   Alcohol use: No    Alcohol/week: 0.0 standard drinks of alcohol   Drug use: No     Allergies   Codeine, Hydrocodone , Lamotrigine , Macrobid  [nitrofurantoin ], and Robaxin  [methocarbamol ]   Review of Systems Review of Systems  Constitutional:  Negative for chills and fever.  HENT:  Negative for ear pain and sore throat.   Eyes:  Negative for pain and visual disturbance.  Respiratory:  Negative for cough and shortness of breath.   Cardiovascular:  Negative for chest pain and palpitations.  Gastrointestinal:  Negative for abdominal pain and vomiting.  Genitourinary:  Negative for dysuria and hematuria.  Musculoskeletal:  Negative for arthralgias and back pain.  Skin:  Positive for wound. Negative for color change and rash.  Neurological:  Negative for seizures and syncope.  All other systems reviewed and  are negative.    Physical Exam Triage Vital Signs ED Triage Vitals  Encounter Vitals Group     BP 11/20/23 1255 (!) 147/82     Girls Systolic BP Percentile --      Girls Diastolic BP Percentile --      Boys Systolic BP Percentile --      Boys Diastolic BP Percentile --      Pulse Rate 11/20/23 1255 67     Resp 11/20/23 1255 20     Temp 11/20/23 1255 97.6 F (36.4 C)     Temp Source 11/20/23 1255 Oral     SpO2 11/20/23 1255 96 %     Weight --      Height --      Head Circumference --      Peak Flow --      Pain Score 11/20/23 1253 0     Pain Loc --      Pain Education --      Exclude from Growth Chart --    No data found.  Updated Vital Signs BP (!) 147/82 (BP Location: Right Arm)   Pulse 67   Temp 97.6 F (36.4 C) (Oral)   Resp 20   SpO2 96%   Visual Acuity Right Eye Distance:   Left Eye Distance:   Bilateral Distance:    Right Eye Near:   Left Eye Near:    Bilateral Near:     Physical Exam Vitals and nursing note reviewed.  Constitutional:      General: She is not in acute distress.    Appearance: She is well-developed.  HENT:  Head: Normocephalic and atraumatic.  Eyes:     Conjunctiva/sclera: Conjunctivae normal.  Cardiovascular:     Rate and Rhythm: Normal rate and regular rhythm.     Pulses:          Dorsalis pedis pulses are 1+ on the left side.       Posterior tibial pulses are 1+ on the left side.     Heart sounds: No murmur heard. Pulmonary:     Effort: Pulmonary effort is normal. No respiratory distress.  Abdominal:     Palpations: Abdomen is soft.  Musculoskeletal:        General: No swelling.     Cervical back: Neck supple.  Skin:    General: Skin is warm and dry.     Capillary Refill: Capillary refill takes less than 2 seconds.      Neurological:     Mental Status: She is alert.  Psychiatric:        Mood and Affect: Mood normal.        Behavior: Behavior normal.        Thought Content: Thought content normal.         Judgment: Judgment normal.      UC Treatments / Results  Labs (all labs ordered are listed, but only abnormal results are displayed) Labs Reviewed - No data to display  EKG   Radiology No results found.  Procedures Procedures (including critical care time)  Medications Ordered in UC Medications - No data to display  Initial Impression / Assessment and Plan / UC Course  I have reviewed the triage vital signs and the nursing notes.  Pertinent labs & imaging results that were available during my care of the patient were reviewed by me and considered in my medical decision making (see chart for details).     Ulcer of left pretibial region with fat layer exposed (HCC)  Lower extremity edema  Aortic atherosclerosis   Nonhealing ulceration on the left lower extremity with bilateral lower extremity edema as well as recent findings of some atherosclerosis in the aorta on CT.  There does not appear to be any infection present.  Although pulses in the left foot are palpable, they are difficult to palpate which could be due to edema but would like to check to make sure that the arterial flow into the leg is adequate for healing.  We recommend the following: Mupirocin ointment twice daily to the affected area until healed.  Use a gauze pad or Telfa pad over the area and keep covered day and night.  Do not use a Band-Aid on the area. Do not wash the area with peroxide or use Neosporin.  Recommend washing it with soap and water  twice daily Make sure to elevate your legs during the day to help with swelling We will send order for ankle brachial indexes which is an ultrasound that will check the blood flow into your legs.  They will contact you from the ultrasound department to schedule this We have placed a referral to the wound care center at [facility] however you will need to call them next week to schedule an appointment. If you feel the area is worsening and are unable to get an  appointment with the wound care clinic then recommend returning to urgent care  Final Clinical Impressions(s) / UC Diagnoses   Final diagnoses:  Ulcer of left pretibial region with fat layer exposed [facility])  Lower extremity edema  Aortic atherosclerosis     Discharge  Instructions      Nonhealing ulceration on the left lower extremity with bilateral lower extremity edema as well as recent findings of some atherosclerosis in the aorta on CT.  There does not appear to be any infection present.  Although pulses in the left foot are palpable, they are difficult to palpate which could be due to edema but would like to check to make sure that the arterial flow into the leg is adequate for healing.  We recommend the following: Mupirocin ointment twice daily to the affected area until healed.  Use a gauze pad or Telfa pad over the area and keep covered day and night.  Do not use a Band-Aid on the area. Do not wash the area with peroxide or use Neosporin.  Recommend washing it with soap and water  twice daily Make sure to elevate your legs during the day to help with swelling We will send order for ankle brachial indexes which is an ultrasound that will check the blood flow into your legs.  They will contact you from the ultrasound department to schedule this We have placed a referral to the wound care center at [facility] however you will need to call them next week to schedule an appointment. If you feel the area is worsening and are unable to get an appointment with the wound care clinic then recommend returning to urgent care    ED Prescriptions     Medication Sig Dispense Auth. Provider   mupirocin ointment (BACTROBAN) 2 % Apply 1 Application topically 2 (two) times daily. 22 g Teresa Almarie LABOR, [place]      PDMP not reviewed this encounter.   Teresa Almarie LABOR, PA-C 11/20/23 1340

## 2023-11-24 ENCOUNTER — Other Ambulatory Visit: Payer: Self-pay | Admitting: Urology

## 2023-11-24 DIAGNOSIS — N390 Urinary tract infection, site not specified: Secondary | ICD-10-CM

## 2023-12-02 ENCOUNTER — Encounter: Payer: Self-pay | Admitting: Neurology

## 2023-12-02 ENCOUNTER — Other Ambulatory Visit: Payer: Self-pay | Admitting: Neurology

## 2023-12-02 DIAGNOSIS — G360 Neuromyelitis optica [Devic]: Secondary | ICD-10-CM | POA: Diagnosis not present

## 2023-12-02 MED ORDER — MODAFINIL 200 MG PO TABS: 200.0000 mg | ORAL_TABLET | Freq: Every day | ORAL | 5 refills | Status: DC

## 2023-12-12 ENCOUNTER — Other Ambulatory Visit: Payer: Self-pay | Admitting: Neurology

## 2023-12-13 ENCOUNTER — Telehealth: Payer: Self-pay

## 2023-12-13 ENCOUNTER — Other Ambulatory Visit (HOSPITAL_COMMUNITY): Payer: Self-pay

## 2023-12-13 NOTE — Telephone Encounter (Signed)
 Requested Prescriptions          Pending Prescriptions Disp Refills   LORazepam  (ATIVAN ) 0.5 MG tablet [Pharmacy Med Name: LORazepam  0.5 MG Oral Tablet] 30 tablet 0      Sig: TAKE 1 TABLET BY MOUTH ONCE DAILY AS NEEDED    LAST SEEN 09/15/23 Next appt 04/19/24  Dispenses     Dispensed Days Supply Quantity Provider Pharmacy  LORAZEPAM  0.5MG      TAB 09/28/2023 30 60 each Sater, Charlie LABOR, MD [facility]  LORAZEPAM  0.5MG      TAB 07/22/2023 30 60 each Sater, Charlie LABOR, MD [facility]  LORAZEPAM  0.5MG      TAB 06/13/2023 30 60 each Sater, Charlie LABOR, MD [facility]  LORAZEPAM  0.5MG      TAB 04/05/2023 30 60 each Sater, Charlie LABOR, MD [facility]  LORAZEPAM  0.5MG      TAB 03/01/2023 30 30 each Sater, Charlie LABOR, MD [facility]  LORAZEPAM  0.5MG      TAB 01/20/2023 30 30 each Sater, Charlie LABOR, MD [email or web address]

## 2023-12-13 NOTE — Telephone Encounter (Signed)
 Pharmacy Patient Advocate Encounter   Received notification from CoverMyMeds that prior authorization for Modafinil 200MG  tablets  is required/requested.   Insurance verification completed.   The patient is insured through [facility].   Per test claim: Please advise

## 2023-12-14 ENCOUNTER — Encounter (HOSPITAL_BASED_OUTPATIENT_CLINIC_OR_DEPARTMENT_OTHER): Attending: General Surgery | Admitting: General Surgery

## 2023-12-14 DIAGNOSIS — L97829 Non-pressure chronic ulcer of other part of left lower leg with unspecified severity: Secondary | ICD-10-CM | POA: Insufficient documentation

## 2023-12-14 DIAGNOSIS — E271 Primary adrenocortical insufficiency: Secondary | ICD-10-CM | POA: Insufficient documentation

## 2023-12-14 DIAGNOSIS — Z7952 Long term (current) use of systemic steroids: Secondary | ICD-10-CM | POA: Insufficient documentation

## 2023-12-14 DIAGNOSIS — Z79620 Long term (current) use of immunosuppressive biologic: Secondary | ICD-10-CM | POA: Insufficient documentation

## 2023-12-15 NOTE — Telephone Encounter (Signed)
 Pharmacy Patient Advocate Encounter    Per test claim: PA required; PA submitted to above mentioned insurance via Latent Key/confirmation #/EOC [id number] Status is pending

## 2023-12-15 NOTE — Telephone Encounter (Signed)
 Pharmacy Patient Advocate Encounter  Received notification from [facility] that Prior Authorization for Modafinil 200MG  tablets has been APPROVED from 12-15-2023 to 12-14-2024   PA #/Case ID/Reference #: [id number]

## 2023-12-16 ENCOUNTER — Encounter (HOSPITAL_BASED_OUTPATIENT_CLINIC_OR_DEPARTMENT_OTHER): Admitting: General Surgery

## 2023-12-20 ENCOUNTER — Encounter (HOSPITAL_BASED_OUTPATIENT_CLINIC_OR_DEPARTMENT_OTHER): Admitting: General Surgery

## 2023-12-20 DIAGNOSIS — L97829 Non-pressure chronic ulcer of other part of left lower leg with unspecified severity: Secondary | ICD-10-CM | POA: Diagnosis not present

## 2023-12-23 ENCOUNTER — Encounter (HOSPITAL_BASED_OUTPATIENT_CLINIC_OR_DEPARTMENT_OTHER): Admitting: General Surgery

## 2024-01-05 ENCOUNTER — Encounter: Payer: Self-pay | Admitting: Family Medicine

## 2024-01-05 ENCOUNTER — Ambulatory Visit (INDEPENDENT_AMBULATORY_CARE_PROVIDER_SITE_OTHER): Admitting: Family Medicine

## 2024-01-05 VITALS — BP 136/58 | HR 62 | Ht 65.0 in | Wt 291.0 lb

## 2024-01-05 DIAGNOSIS — Z0000 Encounter for general adult medical examination without abnormal findings: Secondary | ICD-10-CM

## 2024-01-05 DIAGNOSIS — I10 Essential (primary) hypertension: Secondary | ICD-10-CM | POA: Diagnosis not present

## 2024-01-05 DIAGNOSIS — I2510 Atherosclerotic heart disease of native coronary artery without angina pectoris: Secondary | ICD-10-CM | POA: Diagnosis not present

## 2024-01-05 DIAGNOSIS — K5909 Other constipation: Secondary | ICD-10-CM

## 2024-01-05 DIAGNOSIS — F419 Anxiety disorder, unspecified: Secondary | ICD-10-CM

## 2024-01-05 DIAGNOSIS — E271 Primary adrenocortical insufficiency: Secondary | ICD-10-CM

## 2024-01-05 MED ORDER — CITALOPRAM HYDROBROMIDE 40 MG PO TABS: 40.0000 mg | ORAL_TABLET | Freq: Every day | ORAL | 3 refills | Status: AC

## 2024-01-05 MED ORDER — LINZESS 72 MCG PO CAPS: 72.0000 ug | ORAL_CAPSULE | Freq: Every day | ORAL | 3 refills | Status: AC

## 2024-01-05 NOTE — Assessment & Plan Note (Signed)
-   Ordered coronary calcium  scan. Lipid panel today, fasting.  -Keep scheduled cardiology appointment in January.

## 2024-01-05 NOTE — Assessment & Plan Note (Signed)
 Managed with citalopram  40 mg daily. No SI/HI. Stable.

## 2024-01-05 NOTE — Assessment & Plan Note (Signed)
 Following with [facility].

## 2024-01-05 NOTE — Assessment & Plan Note (Signed)
 Blood pressure well-controlled at 136/58 mmHg on current medication regimen. - BP goal <130/80 - monitor and log blood pressures at home - check around the same time each day in a relaxed setting - Limit salt to <2000 mg/day - Follow DASH eating plan (heart healthy diet) - limit alcohol to 2 standard drinks per day for men and 1 per day for women - avoid tobacco products - get at least 2 hours of regular aerobic exercise weekly Patient aware of signs/symptoms requiring further/urgent evaluation.

## 2024-01-05 NOTE — Progress Notes (Signed)
 New Patient Office Visit   Subjective     Patient ID: Connie Haynes, female   DOB: 10/05/1955  Age: 68 y.o. MRN: [id number]   CC:  Chief Complaint  Patient presents with   Establish Care      HPI Connie Haynes presents to establish/transfer care. She lives with her husband, brother and sister. She is retired from [employer]   Discussed the use of AI scribe software for clinical note transcription with the patient, who gave verbal consent to proceed.  History of Present Illness Connie Haynes is a 68 year old female who presents to transfer care.  She has a history of atrial fibrillation and is currently not on anticoagulation therapy. She can detect when she is in atrial fibrillation and uses an EKG device at home to monitor her heart rate. She was previously on flecainide  but is no longer taking it. Her current medications for heart-related issues include carvedilol  25 mg twice a day and a combination of telmisartan  and hydrochlorothiazide .  She has chronic kidney disease and recurrent urinary tract infections, for which she takes fosfomycin. She also has a history of kidney stones. She follows with Dr. Roseann  She has history of stroke with some decreased leg sensation. She experiences nerve pain and involuntary leg movements, for which she takes lorazepam  and tramadol  as needed. She also uses tizanidine  as a muscle relaxer.  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 She follows up regularly with neurology.  Her past surgical history includes appendectomy, cardiac ablation, carpal tunnel surgery on both sides, neck surgery, colon surgery, eyelid carcinoma removal, multiple knee and shoulder surgeries, hip surgeries, and a hysterectomy.  She has a thyroid nodule that was biopsied and found to be a benign follicular lesion. Followed by endocrinology.   Her medication list includes citalopram  40 mg once a day, hydrocortisone  for  Addison's disease, Ativan  as needed for nerve pain, a multivitamin, mupirocin  ointment for a non-healing wound, potassium on days she takes Lasix , Mirapex , prednisone  on infusion days, rituximab  infusions, and high-dose vitamin D  once a week.       01/05/2024    1:16 PM 08/24/2023    9:01 AM 06/07/2023    5:31 PM  PHQ9 SCORE ONLY  PHQ-9 Total Score 6 1  1       Data saved with a previous flowsheet row definition      01/05/2024    1:17 PM  GAD 7 : Generalized Anxiety Score  Nervous, Anxious, on Edge 1  Control/stop worrying 2  Worry too much - different things 2  Trouble relaxing 3  Restless 2  Easily annoyed or irritable 0  Afraid - awful might happen 0  Total GAD 7 Score 10  Anxiety Difficulty Not difficult at all      Outpatient Medications Prior to Visit  Medication Sig   carvedilol  (COREG ) 25 MG tablet Take 1 tablet by mouth twice daily   fosfomycin (MONUROL ) 3 g PACK TAKE 3 GRAMS BY MOUTH EVERY 10 DAYS   furosemide  (LASIX ) 40 MG tablet TAKE 2 TABLETS BY MOUTH EVERY OTHER DAY   hydrocortisone  (CORTEF ) 10 MG tablet Take 5-10 mg by mouth See admin instructions. Take 10mg  in the AM and 5mg  in the afternoon.   LORazepam  (ATIVAN ) 0.5 MG tablet Take 1 tablet by mouth twice daily as needed   Multiple Vitamin (MULTIVITAMIN ADULT PO) Take 1 tablet by mouth daily.   mupirocin   ointment (BACTROBAN ) 2 % Apply 1 Application topically 2 (two) times daily.   potassium chloride  (KLOR-CON ) 10 MEQ tablet Take 10 mEq by mouth as needed. WITH LASIX    pramipexole  (MIRAPEX ) 1 MG tablet TAKE 1 PO BID   predniSONE  (DELTASONE ) 20 MG tablet Take 3 pills po the day before infusions.   riTUXimab  (RITUXAN IV) Inject into the vein.   telmisartan -hydrochlorothiazide  (MICARDIS  HCT) 80-12.5 MG tablet Take 1 tablet by mouth daily.   tiZANidine  (ZANAFLEX ) 4 MG tablet TAKE 1 TO 1 & 1/2 (ONE & ONE-HALF) TABLETS BY MOUTH THREE TIMES DAILY AS NEEDED   traMADol  (ULTRAM ) 50 MG tablet Take 1 tablet (50 mg  total) by mouth every 8 (eight) hours.   Vitamin D , Ergocalciferol , (DRISDOL) 1.25 MG (50000 UNIT) CAPS capsule Take 50,000 Units by mouth once a week.   [DISCONTINUED] citalopram  (CELEXA ) 40 MG tablet Take 1 tablet (40 mg total) by mouth daily.   [DISCONTINUED] LINZESS  72 MCG capsule Take 1 capsule (72 mcg total) by mouth daily before breakfast.   [DISCONTINUED] estradiol  (ESTRACE ) 0.01 % CREA vaginal cream Place 1 Applicatorful vaginally 3 (three) times a week.   [DISCONTINUED] flecainide  (TAMBOCOR ) 50 MG tablet Take 75 mg by mouth 2 (two) times daily.   [DISCONTINUED] lacosamide  (VIMPAT ) 50 MG TABS tablet Take 50 mg by mouth 2 (two) times daily.   [DISCONTINUED] LORazepam  (ATIVAN ) 0.5 MG tablet TAKE 1 TABLET BY MOUTH ONCE DAILY AS NEEDED   [DISCONTINUED] modafinil  (PROVIGIL ) 200 MG tablet Take 1 tablet (200 mg total) by mouth daily.   [DISCONTINUED] ondansetron  (ZOFRAN ) 4 MG tablet Take 4 mg by mouth every 8 (eight) hours as needed.   Facility-Administered Medications Prior to Visit  Medication Dose Route Frequency Provider   chlorhexidine  (HIBICLENS ) 4 % liquid 4 application  60 mL Topical Once Dixon, Debby Cain, PA-C   technetium tetrofosmin  (TC-MYOVIEW ) injection 31.7 milli Curie  31.7 millicurie Intravenous Once PRN Maranda Leim DEL, MD   Past Medical History:  Diagnosis Date   A-fib [facility])    Adrenal insufficiency    Anemia    denies   Anxiety    Basal cell carcinoma    eye lid   Cataract    Chronic kidney disease, stage 3 (HCC)    Cough    hx. dry cough chronic   Disorder of tear duct system    frequent tearing of eyes   GERD (gastroesophageal reflux disease)    H/O: whooping cough 2000   History of kidney infection 2015   ? sepsis   History of kidney stones    History of kidney stones    Hyperlipidemia    Hypertension    IBS (irritable bowel syndrome)    controlled with med   Osteoarthritis    arthritis -joints-limited ROM shoulders more on right   PONV  (postoperative nausea and vomiting)     per pt hx of low BP following shoulder surgery in 05-2015; was managed in hospital with IV fluids and d/c BP being taken a tthe time; pt was dced and F/U with endcrinoopgist who dx with adrenal insurffiency and started steroid therapy; treatmetnt with positive outome, now managed chronically on hydrocortisone  tablets;   Sleep apnea    no longer using cpap   Stroke [facility])    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 Windy)   APPENDECTOMY     CARDIAC ELECTROPHYSIOLOGY STUDY AND ABLATION     '93 for WPW syndrome   CARPAL TUNNEL RELEASE Right 12/21/2014   Procedure: RIGHT CARPAL TUNNEL RELEASE ;  Surgeon: Marcey Her, MD;  Location: [facility] OR;  Service: Orthopedics;  Laterality: Right;   CARPAL TUNNEL RELEASE Left    CERVICAL SPINE SURGERY     cervical fusion with hardware retained   COLON SURGERY     colon detached from abdomen   COLONOSCOPY WITH PROPOFOL  N/A 05/10/2014   Procedure: COLONOSCOPY WITH PROPOFOL ;  Surgeon: Renaye Sous, MD;  Location: [facility];  Service: Endoscopy;  Laterality: N/A;   CYSTOSCOPY/RETROGRADE/URETEROSCOPY Left 12/18/2013   Procedure: CYSTOSCOPY/RETROGRADE/LEFT STENT;  Surgeon: Norleen JINNY Seltzer, MD;  Location: [facility] ORS;  Service: Urology;  Laterality: Left;   ESOPHAGOGASTRODUODENOSCOPY (EGD) WITH PROPOFOL  N/A 05/10/2014   Procedure: ESOPHAGOGASTRODUODENOSCOPY (EGD) WITH PROPOFOL ;  Surgeon: Renaye Sous, MD;  Location: [facility];  Service: Endoscopy;  Laterality: N/A;   EYE SURGERY     EYELID CARCINOMA EXCISION      INSERTION OF DIALYSIS CATHETER N/A 08/16/2019   Procedure: INSERTION OF DIALYSIS CATHETER;  Surgeon: Brenna Adine CROME, DO;  Location: [facility]OSCOPY;  Service: Pulmonary;  Laterality: N/A;   JOINT REPLACEMENT     KNEE ARTHROSCOPY Left 07/16/2016   REVERSE SHOULDER ARTHROPLASTY Right 06/29/2014   Procedure: REVERSE TOTAL SHOULDER ARTHROPLASTY;  Surgeon: Marcey Her, MD;  Location: [facility] OR;  Service: Orthopedics;  Laterality: Right;   REVISION TOTAL SHOULDER TO REVERSE TOTAL SHOULDER Left 05/15/2015   REVISION TOTAL SHOULDER TO REVERSE TOTAL SHOULDER Left 05/15/2015   Procedure: LEFT SHOULDER REVISION TOTAL SHOULDER ARTHRPLASTY TO REVERSE TOTAL SHOULDER ARTHROPLASTY ;  Surgeon: Marcey Her, MD;  Location: [facility] OR;  Service: Orthopedics;  Laterality: Left;   SPINE SURGERY     tear duct surgery     TOTAL ABDOMINAL HYSTERECTOMY     TOTAL HIP ARTHROPLASTY     x 3(rt x2, lt x1)   TOTAL KNEE ARTHROPLASTY Right 08/08/2013   Procedure: RIGHT TOTAL KNEE ARTHROPLASTY WITH SAPHENOUS NERVE SCAR EXCISION;  Surgeon: Donnice JONETTA Car, MD;  Location: [facility];  Service: Orthopedics;  Laterality: Right;   TOTAL KNEE ARTHROPLASTY Left 08/25/2016   Procedure: LEFT TOTAL KNEE ARTHROPLASTY;  Surgeon: Car Donnice, MD;  Location: [facility];  Service: Orthopedics;  Laterality: Left;  70 mins; Adductor block   TOTAL SHOULDER ARTHROPLASTY Left 07/15/2012   Procedure: LEFT TOTAL SHOULDER ARTHROPLASTY;  Surgeon: [name], MD;  Location: [facility] OR;  Service: Orthopedics;  Laterality: Left;   TOTAL SHOULDER REVISION Right 12/21/2014   Procedure: RIGHT SHOULDER POLY EXCHANGE ;  Surgeon: Marcey Her, MD;  Location: [facility] OR;  Service: Orthopedics;  Laterality: Right;   URETEROSCOPY  stent placement     Family History  Problem Relation Age of Onset   Heart disease Mother    Hypertension Mother    Hyperlipidemia Mother    Heart attack Mother    Heart failure Mother    Leukemia Brother    Anxiety disorder Brother    Cancer  Maternal Uncle        bone   Heart disease Maternal Grandfather    Heart attack  Maternal Grandfather    Cancer Paternal Grandfather    Lung cancer Paternal Grandfather    COPD Brother    Seizures Neg Hx     Social History   Socioeconomic History   Marital status: Married    Spouse name: Sport And Exercise Psychologist   Number of children: 0   Years of education: Not on file   Highest education level: Associate degree: occupational, scientist, product/process development, or vocational program  Occupational History   Occupation: Retired     Associate Professor: [employer]  Tobacco Use   Smoking status: Never   Smokeless tobacco: Never  Vaping Use   Vaping status: Never Used  Substance and Sexual Activity   Alcohol use: No    Alcohol/week: 0.0 standard drinks of alcohol   Drug use: No   Sexual activity: Not Currently    Partners: Male  Other Topics Concern   Not on file  Social History Narrative   Marital Status:  Married Armed Forces Operational Officer)   Living Situation: Lives with spouse   Occupation: Psychologist, 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   Tobacco Use/Exposure:  None   Alcohol Use: None   Drug Use:  None   Diet:  Low Fat/Low Carb   Exercise:  3-4 Days per week   Hobbies:  Reading, Travel   Left handed    Caffeine: 1 cup coffee every morning, diet coke (rare)   Social Drivers of Corporate Investment Banker Strain: Low Risk  (01/04/2024)   Overall Financial Resource Strain (CARDIA)    Difficulty of Paying Living Expenses: Not very hard  Food Insecurity: No Food Insecurity (01/04/2024)   Hunger Vital Sign    Worried About Running Out of Food in the Last Year: Never true    Ran Out of Food in the Last Year: Never true  Transportation Needs: No Transportation Needs (01/04/2024)   PRAPARE - Administrator, Civil Service (Medical): No    Lack of Transportation (Non-Medical): No  Physical Activity: Insufficiently Active (01/04/2024)   Exercise Vital Sign    Days of Exercise per Week: 7 days    Minutes of  Exercise per Session: 10 min  Stress: Stress Concern Present (01/04/2024)   [employer] of Occupational Health - Occupational Stress Questionnaire    Feeling of Stress: To some extent  Social Connections: Moderately Isolated (01/04/2024)   Social Connection and Isolation Panel    Frequency of Communication with Friends and Family: Three times a week    Frequency of Social Gatherings with Friends and Family: Once a week    Attends Religious Services: Never    Database Administrator or Organizations: No    Attends Engineer, Structural: Not on file    Marital Status: Married       ROS All review of systems negative except what is listed in the HPI    Objective     BP (!) 136/58   Pulse 62   Ht 5' 5 (1.651 m)   Wt 291 lb (132 kg) Comment: did not check today per patient request/ last weight recorded  SpO2 99%   BMI 48.42 kg/m   Physical Exam Vitals reviewed.  Constitutional:      Appearance: Normal appearance. She is obese.  Cardiovascular:     Rate and Rhythm: Normal rate and regular rhythm.  Pulmonary:     Effort: Pulmonary effort is normal.     Breath sounds: Normal breath sounds.  Skin:    General: Skin  is warm and dry.  Neurological:     Mental Status: She is alert and oriented to person, place, and time.  Psychiatric:        Mood and Affect: Mood normal.        Behavior: Behavior normal.        Thought Content: Thought content normal.        Judgment: Judgment normal.        Assessment & Plan:     Problem List Items Addressed This Visit       Active Problems   Anxiety   Managed with citalopram  40 mg daily. No SI/HI. Stable.       Relevant Medications   citalopram  (CELEXA ) 40 MG tablet   Essential hypertension, benign   Blood pressure well-controlled at 136/58 mmHg on current medication regimen. - BP goal <130/80 - monitor and log blood pressures at home - check around the same time each day in a relaxed setting - Limit salt to <2000  mg/day - Follow DASH eating plan (heart healthy diet) - limit alcohol to 2 standard drinks per day for men and 1 per day for women - avoid tobacco products - get at least 2 hours of regular aerobic exercise weekly Patient aware of signs/symptoms requiring further/urgent evaluation.       Relevant Orders   TSH   Comprehensive metabolic panel with GFR   Morbid obesity (HCC)   Healthy lifestyle encouraged.       Adrenal insufficiency (Addison's disease) [facility])   Following with [facility] end[facility].       Coronary artery calcification - Primary   - Ordered coronary calcium  scan. Lipid panel today, fasting.  -Keep scheduled cardiology appointment in January.      Relevant Orders   CT CARDIAC SCORING (SELF PAY ONLY)   Lipid panel   Other Visit Diagnoses       Encounter for medical examination to establish care         Chronic constipation       Relevant Medications   LINZESS  72 MCG capsule             Return in about 3 months (around 04/06/2024) for chronic disease management.  Waddell KATHEE Mon, NP  I,Emily Lagle,acting as a scribe for Waddell KATHEE Mon, NP.,have documented all relevant documentation on the behalf of Waddell KATHEE Mon, NP.  I, Waddell KATHEE Mon, NP, have reviewed all documentation for this visit. The documentation on 01/05/2024 for the exam, diagnosis, procedures, and orders are all accurate and complete.

## 2024-01-05 NOTE — Assessment & Plan Note (Signed)
Healthy lifestyle encouraged.

## 2024-01-06 LAB — LIPID PANEL
Cholesterol: 186 mg/dL (ref <200)
HDL: 68 mg/dL (ref >=50)
LDL Cholesterol (Calc): 99 mg/dL
Non-HDL Cholesterol (Calc): 118 mg/dL (ref <130)
Total CHOL/HDL Ratio: 2.7 (calc) (ref <5.0)
Triglycerides: 96 mg/dL (ref <150)

## 2024-01-06 LAB — COMPREHENSIVE METABOLIC PANEL WITH GFR
AG Ratio: 2.2 (calc) (ref 1.0–2.5)
ALT: 14 U/L (ref 6–29)
AST: 13 U/L (ref 10–35)
Albumin: 4.4 g/dL (ref 3.6–5.1)
Alkaline phosphatase (APISO): 75 U/L (ref 37–153)
BUN/Creatinine Ratio: 34 (calc) — ABNORMAL HIGH (ref 6–22)
BUN: 32 mg/dL — ABNORMAL HIGH (ref 7–25)
CO2: 27 mmol/L (ref 20–32)
Calcium: 9.6 mg/dL (ref 8.6–10.4)
Chloride: 103 mmol/L (ref 98–110)
Creat: 0.94 mg/dL (ref 0.60–1.00)
Globulin: 2 g/dL (ref 1.9–3.7)
Glucose, Bld: 101 mg/dL — ABNORMAL HIGH (ref 65–99)
Potassium: 3.5 mmol/L (ref 3.5–5.3)
Sodium: 141 mmol/L (ref 135–146)
Total Bilirubin: 0.6 mg/dL (ref 0.2–1.2)
Total Protein: 6.4 g/dL (ref 6.1–8.1)
eGFR: 65 mL/min/1.73m2 (ref >=60)

## 2024-01-06 LAB — TSH: TSH: 1.18 m[IU]/L (ref 0.40–4.50)

## 2024-01-10 ENCOUNTER — Ambulatory Visit: Payer: Self-pay | Admitting: Family Medicine

## 2024-01-13 ENCOUNTER — Encounter (HOSPITAL_BASED_OUTPATIENT_CLINIC_OR_DEPARTMENT_OTHER): Attending: General Surgery | Admitting: General Surgery

## 2024-01-13 DIAGNOSIS — L97829 Non-pressure chronic ulcer of other part of left lower leg with unspecified severity: Secondary | ICD-10-CM | POA: Diagnosis present

## 2024-01-13 DIAGNOSIS — Z7952 Long term (current) use of systemic steroids: Secondary | ICD-10-CM | POA: Insufficient documentation

## 2024-01-13 DIAGNOSIS — E271 Primary adrenocortical insufficiency: Secondary | ICD-10-CM | POA: Diagnosis not present

## 2024-01-13 DIAGNOSIS — Z79620 Long term (current) use of immunosuppressive biologic: Secondary | ICD-10-CM | POA: Diagnosis not present

## 2024-01-17 ENCOUNTER — Ambulatory Visit (HOSPITAL_BASED_OUTPATIENT_CLINIC_OR_DEPARTMENT_OTHER)
Admission: RE | Admit: 2024-01-17 | Discharge: 2024-01-17 | Disposition: A | Discharge status: Home / Self Care | Payer: Self-pay | Source: Ambulatory Visit | Attending: Family Medicine | Admitting: Family Medicine

## 2024-01-17 DIAGNOSIS — I2510 Atherosclerotic heart disease of native coronary artery without angina pectoris: Secondary | ICD-10-CM | POA: Insufficient documentation

## 2024-01-25 ENCOUNTER — Encounter: Payer: Self-pay | Admitting: Family Medicine

## 2024-01-25 ENCOUNTER — Telehealth: Admitting: Family Medicine

## 2024-01-25 VITALS — Ht 65.0 in | Wt 291.0 lb

## 2024-01-25 DIAGNOSIS — I2510 Atherosclerotic heart disease of native coronary artery without angina pectoris: Secondary | ICD-10-CM

## 2024-01-25 NOTE — Assessment & Plan Note (Signed)
 Discussed results of most recent lipid panel and coronary calcium  scan. Questions answered. Reviewed options, and she prefers to focus on lifestyle measures + Omega-3 supplement before adding cholesterol medications.  Encouraged heart healthy diet and regular exercise.  Cardiology appointment scheduled for January.

## 2024-01-25 NOTE — Progress Notes (Signed)
 Virtual Video Visit via MyChart Note  I connected with  Connie Haynes on @TODAY @ at  9:20 AM EST by the video enabled telemedicine application for MyChart, and verified that I am speaking with the correct person using two identifiers.   I introduced myself as a Publishing Rights Manager with the practice. We discussed the limitations of evaluation and management by telemedicine and the availability of in person appointments. The patient expressed understanding and agreed to proceed.  Participating parties in this visit include: The patient and the nurse practitioner listed.  The patient is: At home I am: In the office - [facility]  Subjective:    CC:  Chief Complaint  Patient presents with   Hyperlipidemia    HPI: Connie Haynes is a 68 y.o. year old female presenting today via MyChart today for medication discussion.  Discussed the use of AI scribe software for clinical note transcription with the patient, who gave verbal consent to proceed.  History of Present Illness Connie Haynes is a 68 year old female who presents for evaluation of her cholesterol levels and coronary calcium  scan results.  She is concerned about her cholesterol levels and the results of a recent coronary calcium  scan, which showed a score of 44, placing her in the 61st percentile for her age, gender, and ethnicity. Her total cholesterol is 186 mg/dL, which is within the normal range, and her LDL cholesterol is 99 mg/dL, which  could be improved.  She is apprehensive about starting cholesterol medication due to potential side effects and interactions with her current medications. She prefers to monitor her condition through lifestyle changes, including diet and exercise, rather than starting medication immediately.  Thorough discussion and questions answered today.      Hyperlipidemia: - 01/17/2024 CT Cardiac Score: 44, 61st percentile for demographics - medications: none -  compliance: n/a - medication SEs: n/a The ASCVD Risk score (Arnett DK, et al., 2019) failed to calculate for the following reasons:   Risk score cannot be calculated because patient has a medical history suggesting prior/existing ASCVD   * - Cholesterol units were assumed  The ASCVD Risk score (Arnett DK, et al., 2019) failed to calculate for the following reasons:   Risk score cannot be calculated because patient has a medical history suggesting prior/existing ASCVD   * - Cholesterol units were assumed    Past medical history, Surgical history, Family history not pertinant except as noted below, Social history, Allergies, and medications have been entered into the medical record, reviewed, and corrections made.   Review of Systems:  All review of systems negative except what is listed in the HPI   Objective:    General:  Speaking clearly in complete sentences. Absent shortness of breath noted.   Alert and oriented x3.   Normal judgment.  Absent acute distress.   Impression and Recommendations:    Problem List Items Addressed This Visit       Active Problems   Coronary artery calcification - Primary   Discussed results of most recent lipid panel and coronary calcium  scan. Questions answered. Reviewed options, and she prefers to focus on lifestyle measures + Omega-3 supplement before adding cholesterol medications.  Encouraged heart healthy diet and regular exercise.  Cardiology appointment scheduled for January.       Routine follow-up in 3-4 months.     I discussed the assessment and treatment plan with the patient. The patient was provided an opportunity to ask  questions and all were answered. The patient agreed with the plan and demonstrated an understanding of the instructions.   The patient was advised to call back or seek an in-person evaluation if the symptoms worsen or if the condition fails to improve as anticipated.   Connie KATHEE Mon, NP  I,Emily  Lagle,acting as a scribe for Connie KATHEE Mon, NP.,have documented all relevant documentation on the behalf of Connie KATHEE Mon, NP.  I, Connie KATHEE Mon, NP, have reviewed all documentation for this visit. The documentation on 01/25/2024 for the exam, diagnosis, procedures, and orders are all accurate and complete.

## 2024-02-17 ENCOUNTER — Other Ambulatory Visit: Payer: Self-pay | Admitting: Student

## 2024-02-17 ENCOUNTER — Other Ambulatory Visit: Payer: Self-pay | Admitting: Neurology

## 2024-02-24 ENCOUNTER — Encounter (HOSPITAL_BASED_OUTPATIENT_CLINIC_OR_DEPARTMENT_OTHER): Payer: Self-pay | Admitting: Cardiovascular Disease

## 2024-02-24 ENCOUNTER — Ambulatory Visit (INDEPENDENT_AMBULATORY_CARE_PROVIDER_SITE_OTHER): Admitting: Cardiovascular Disease

## 2024-02-24 VITALS — BP 90/50 | HR 58 | Ht 65.0 in | Wt 305.0 lb

## 2024-02-24 DIAGNOSIS — I2510 Atherosclerotic heart disease of native coronary artery without angina pectoris: Secondary | ICD-10-CM | POA: Diagnosis not present

## 2024-02-24 DIAGNOSIS — R0609 Other forms of dyspnea: Secondary | ICD-10-CM | POA: Diagnosis not present

## 2024-02-24 DIAGNOSIS — I10 Essential (primary) hypertension: Secondary | ICD-10-CM | POA: Diagnosis not present

## 2024-02-24 DIAGNOSIS — G4733 Obstructive sleep apnea (adult) (pediatric): Secondary | ICD-10-CM

## 2024-02-24 DIAGNOSIS — I456 Pre-excitation syndrome: Secondary | ICD-10-CM

## 2024-02-24 DIAGNOSIS — N182 Chronic kidney disease, stage 2 (mild): Secondary | ICD-10-CM | POA: Diagnosis not present

## 2024-02-24 MED ORDER — ROSUVASTATIN CALCIUM 10 MG PO TABS: 10.0000 mg | ORAL_TABLET | Freq: Every day | ORAL | 3 refills | Status: AC

## 2024-02-24 MED ORDER — METOPROLOL TARTRATE 50 MG PO TABS: ORAL_TABLET | ORAL | 0 refills | Status: DC

## 2024-02-24 NOTE — Patient Instructions (Addendum)
 Medication Instructions:  START ROSUVASTATIN  TO 10 MG DAILY   HOLD YOUR CARVEDILOL  MORNING OF CARDIAC CT AND TAKE METOPROLOL  50 MG 2 HOURS PRIOR TO PROCEDURE   *If you need a refill on your cardiac medications before your next appointment, please call your pharmacy*  Lab Work: BMET 1 WEEK PRIOR TO CT   FASTING LIPID/CMET IN 2 TO 3 MONTHS ABOUT A WEEK PRIOR TO FOLLOW UP   If you have labs (blood work) drawn today and your tests are completely normal, you will receive your results only by: MyChart Message (if you have MyChart) OR A paper copy in the mail If you have any lab test that is abnormal or we need to change your treatment, we will call you to review the results.  Testing/Procedures: Your physician has requested that you have an echocardiogram. Echocardiography is a painless test that uses sound waves to create images of your heart. It provides your doctor with information about the size and shape of your heart and how well your hearts chambers and valves are working. This procedure takes approximately one hour. There are no restrictions for this procedure. Please do NOT wear cologne, perfume, aftershave, or lotions (deodorant is allowed). Please arrive 15 minutes prior to your appointment time.  Please note: We ask at that you not bring children with you during ultrasound (echo/ vascular) testing. Due to room size and safety concerns, children are not allowed in the ultrasound rooms during exams. Our front office staff cannot provide observation of children in our lobby area while testing is being conducted. An adult accompanying a patient to their appointment will only be allowed in the ultrasound room at the discretion of the ultrasound technician under special circumstances. We apologize for any inconvenience.   Your physician has requested that you have cardiac CT. Cardiac computed tomography (CT) is a painless test that uses an x-ray machine to take clear, detailed pictures of  your heart. For further information please visit [email or web address] Please follow instruction sheet as given.  [facility] HOME SLEEP STUDY   Follow-Up: At [facility], you and your health needs are our priority.  As part of our continuing mission to provide you with exceptional heart care, our providers are all part of one team.  This team includes your primary Cardiologist (physician) and Advanced Practice Providers or APPs (Physician Assistants and Nurse Practitioners) who all work together to provide you with the care you need, when you need it.  Your next appointment:   2 TO 3 month(s)  Provider:   Annabella Scarce, MD, Rosaline Bane, NP, or Reche Finder, NP   You have been referred to ELECTROPHYSIOLOGY    We recommend signing up for the patient portal called MyChart.  Sign up information is provided on this After Visit Summary.  MyChart is used to connect with patients for Virtual Visits (Telemedicine).  Patients are able to view lab/test results, encounter notes, upcoming appointments, etc.  Non-urgent messages can be sent to your provider as well.   To learn more about what you can do with MyChart, go to [email or web address].   Other Instructions   Your cardiac CT will be scheduled at one of the below locations:   [facility] [address] [place], [place] [address] [phone] (Severe contrast allergies only)  OR   [facility] [address] [place], [place] [address] [phone]  OR   [facility] [address] [place], [place] [address] [phone]  OR   [facility] [address]  [place], [place] [address]  OR   [facility] [address] [place], [place] [phone]  If scheduled at [facility], please arrive at the [facility] (Entrance C2) of [facility] 30 minutes prior to test start time. You can use  the FREE valet parking offered at entrance C (encouraged to control the heart rate for the test)  Proceed to the [facility] Radiology Department (first floor) to check-in and test prep.  All radiology patients and guests should use entrance C2 at [facility], accessed from [facility], even though the hospital's physical address listed is [address]  If scheduled at the Heart and Vascular Tower at [address], please enter the parking lot using the [address] entrance and use the FREE valet service at the patient drop-off area. Enter the building and check-in with registration on the main floor.  If scheduled at [facility], please arrive to the Heart and Vascular Center 15 mins early for check-in and test prep.  There is spacious parking and easy access to the radiology department from the [facility] V[facility] entrance. Please enter here and check-in with the desk attendant.   If scheduled at [facility], please arrive 30 minutes early for check-in and test prep.  Please follow these instructions carefully (unless otherwise directed):  An IV will be required for this test and Nitroglycerin will be given.  Hold all erectile dysfunction medications at least 3 days (72 hrs) prior to test. (Ie viagra, cialis, sildenafil, tadalafil, etc)   On the Night Before the Test: Be sure to Drink plenty of water . Do not consume any caffeinated/decaffeinated beverages or chocolate 12 hours prior to your test. Do not take any antihistamines 12 hours prior to your test.  On the Day of the Test: Drink plenty of water  until 1 hour prior to the test. Do not eat any food 1 hour prior to test. You may take your regular medications prior to the test.  Take metoprolol  (Lopressor ) two hours prior to test. If you take Furosemide /Hydrochlorothiazide /Spironolactone/Chlorthalidone, please HOLD on the morning of the test. Patients who wear a  continuous glucose monitor MUST remove the device prior to scanning. FEMALES- please wear underwire-free bra if available, avoid dresses & tight clothing  After the Test: Drink plenty of water . After receiving IV contrast, you may experience a mild flushed feeling. This is normal. On occasion, you may experience a mild rash up to 24 hours after the test. This is not dangerous. If this occurs, you can take Benadryl  25 mg, Zyrtec, Claritin, or Allegra and increase your fluid intake. (Patients taking Tikosyn should avoid Benadryl , and may take Zyrtec, Claritin, or Allegra) If you experience trouble breathing, this can be serious. If it is severe call 911 IMMEDIATELY. If it is mild, please call our office.  We will call to schedule your test 2-4 weeks out understanding that some insurance companies will need an authorization prior to the service being performed.   For more information and frequently asked questions, please visit our website : [email or web address]  For non-scheduling related questions, please contact the cardiac imaging nurse navigator should you have any questions/concerns: Cardiac Imaging Nurse Navigators Direct Office Dial: [phone]   For scheduling needs, including cancellations and rescheduling, please call Brittany, [phone].  Cardiac CT Angiogram A cardiac CT angiogram is a procedure to look at the heart  and the area around the heart. It may be done to help find the cause of chest pains or other symptoms of heart disease. During this procedure, a substance called contrast dye is injected into a vein in the arm. The contrast highlights the blood vessels in the area to be checked. A large X-ray machine (CT scanner), then takes detailed pictures of the heart and the surrounding area. The procedure is also sometimes called a coronary CT angiogram, coronary artery scanning, or CTA. A cardiac CT angiogram allows the health care provider to see how well blood is  flowing to and from the heart. The provider will be able to see if there are any problems, such as: Blockage or narrowing of the arteries in the heart. Fluid around the heart. Signs of weakness or disease in the muscles, valves, and tissues of the heart. Tell a health care provider about: Any allergies you have. This is especially important if you have had a previous allergic reaction to medicines, contrast dye, or iodine. All medicines you are taking, including vitamins, herbs, eye drops, creams, and over-the-counter medicines. Any bleeding problems you have. Any surgeries you have had. Any medical conditions you have, including kidney problems or kidney failure. Whether you are pregnant or may be pregnant. Any anxiety disorders, chronic pain, or other conditions you have. These may increase your stress or prevent you from lying still. Any history of abnormal heart rhythms or heart procedures. What are the risks? Your provider will talk with you about risks. These may include: Bleeding. Infection. Allergic reactions to medicines or dyes. Damage to other structures or organs. Kidney damage from the contrast dye. Increased risk of cancer from radiation exposure. This risk is low. Talk with your provider about: The risks and benefits of testing. How you can receive the lowest dose of radiation. What happens before the procedure? Wear comfortable clothing and remove any jewelry, glasses, dentures, and hearing aids. Follow instructions from your provider about eating and drinking. These may include: 12 hours before the procedure Avoid caffeine. This includes tea, coffee, soda, energy drinks, and diet pills. Drink plenty of water  or other fluids that do not have caffeine in them. Being well hydrated can prevent complications. 4-6 hours before the procedure Stop eating and drinking. This will reduce the risk of nausea from the contrast dye. Ask your provider about changing or stopping your  regular medicines. These include: Diabetes medicines. Medicines to treat problems with erections (erectile dysfunction). If you have kidney problems, you may need to receive IV hydration before and after the test. What happens during the procedure?  Hair on your chest may need to be removed so that small sticky patches called electrodes can be placed on your chest. These will transmit information that helps to monitor your heart during the procedure. An IV will be inserted into one of your veins. You might be given a medicine to control your heart rate during the procedure. This will help to ensure that good images are obtained. You will be asked to lie on an exam table. This table will slide in and out of the CT machine during the procedure. Contrast dye will be injected into the IV. You might feel warm, or you may get a metallic taste in your mouth. You may be given medicines to relax or dilate the arteries in your heart. If you are allergic to contrast dyes or iodine you may be given medicine before the test to reduce the risk of an allergic reaction.  The table that you are lying on will move into the CT machine tunnel for the scan. The person running the machine will give you instructions while the scans are being done. You may be asked to: Keep your arms above your head. Hold your breath for short periods. Stay very still, even if the table is moving. The procedure may vary among providers and hospitals. What can I expect after the procedure? After your procedure, it is common to have: A metallic taste in your mouth from the contrast dye. A feeling of warmth. A headache from the heart medicine. Follow these instructions at home: Take over-the-counter and prescription medicines only as told by your provider. If you are told, drink enough fluid to keep your pee pale yellow. This will help to flush the contrast dye out of your body. Most people can return to their normal activities right  after the procedure. Ask your provider what activities are safe for you. It is up to you to get the results of your procedure. Ask your provider, or the department that is doing the procedure, when your results will be ready. Contact a health care provider if: You have any symptoms of allergy  to the contrast dye. These include: Shortness of breath. Rash or hives. A racing heartbeat. You notice a change in your peeing (urination). This information is not intended to replace advice given to you by your health care provider. Make sure you discuss any questions you have with your health care provider. Document Revised: 08/29/2021 Document Reviewed: 08/29/2021 Elsevier Patient Education  2024 Elsevier Inc.  [place]?  Is a FDA cleared portable home sleep study test that uses a watch and 3 points of contact to monitor 7 different channels, including your heart rate, oxygen saturations, body position, snoring, and chest motion.  The study is easy to use from the comfort of your own home and accurately detect sleep apnea.  Before bed, you attach the chest sensor, attached the sleep apnea bracelet to your nondominant hand, and attach the finger probe.  After the study, the raw data is downloaded from the watch and scored for apnea events.   For more information: [email or web address]  Patient Testing Instructions:  Do not put battery into the device until bedtime when you are ready to begin the test. Please call the support number if you need assistance after following the instructions below: 24 hour support line- [phone] or [facility] support at [phone] (option 2)  Download the [facility] WatchPAT One app through the google play store or App Store  Be sure to turn on or enable access to bluetooth in settlings on your smartphone/ device  Make sure no other bluetooth devices are on and within the vicinity of your smartphone/ device and WatchPAT watch during testing.  Make sure to  leave your smart phone/ device plugged in and charging all night.  When ready for bed:  Follow the instructions step by step in the WatchPAT One App to activate the testing device. For additional instructions, including video instruction, visit the WatchPAT One video on Youtube. You can search for WatchPat One within Youtube (video is 4 minutes and 18 seconds) or enter: https://youtube/[email or web address]?[email or web address] Please note: You will be prompted to enter a Pin to connect via bluetooth when starting the test. The PIN will be assigned to you when you receive the test.  The device is disposable, but it recommended that you retain the device until you receive a call letting you know the study has been received and  the results have been interpreted.  We will let you know if the study did not transmit to us  properly after the test is completed. You do not need to call us  to confirm the receipt of the test.  Please complete the test within 48 hours of receiving PIN.   Frequently Asked Questions:  What is Watch Bruna one?  A single use fully disposable home sleep apnea testing device and will not need to be returned after completion.  What are the requirements to use WatchPAT one?  The be able to have a successful watchpat one sleep study, you should have your Watch pat one device, your smart phone, watch pat one app, your PIN number and Internet access What type of phone do I need?  You should have a smart phone that uses Android 5.1 and above or any Iphone with IOS 10 and above How can I download the WatchPAT one app?  Based on your device type search for WatchPAT one app either in google play for android devices or APP store for Iphone's Where will I get my PIN for the study?  Your PIN will be provided by your physician's office. It is used for authentication and if you lose/forget your PIN, please reach out to your providers office.  I do not have Internet at home. Can I do WatchPAT one study?  WatchPAT One  needs Internet connection throughout the night to be able to transmit the sleep data. You can use your home/local internet or your cellular's data package. However, it is always recommended to use home/local Internet. It is estimated that between 20MB-30MB will be used with each study.However, the application will be looking for space in the phone to start the study.  What happens if I lose internet or bluetooth connection?  During the internet disconnection, your phone will not be able to transmit the sleep data. All the data, will be stored in your phone. As soon as the internet connection is back on, the phone will being sending the sleep data. During the bluetooth disconnection, WatchPAT one will not be able to to send the sleep data to your phone. Data will be kept in the WatchPAT one until two devices have bluetooth connection back on. As soon as the connection is back on, WatchPAT one will send the sleep data to the phone.  How long do I need to wear the WatchPAT one?  After you start the study, you should wear the device at least 6 hours.  How far should I keep my phone from the device?  During the night, your phone should be within 15 feet.  What happens if I leave the room for restroom or other reasons?  Leaving the room for any reason will not cause any problem. As soon as your get back to the room, both devices will reconnect and will continue to send the sleep data. Can I use my phone during the sleep study?  Yes, you can use your phone as usual during the study. But it is recommended to put your watchpat one on when you are ready to go to bed.  How will I get my study results?  A soon as you completed your study, your sleep data will be sent to the provider. They will then share the results with you when they are ready.

## 2024-02-24 NOTE — Progress Notes (Signed)
 " Cardiology Office Note:  .   Date:  02/24/2024  ID:  Connie Haynes, DOB 05-Jun-1955, MRN [id number] PCP: [name], NP  [facility] Providers Cardiologist:  None Electrophysiologist:  Elspeth Sage, MD (Inactive)    History of Present Illness: .    Connie Haynes is a 69 y.o. female with hypertension, hyperlipidemia, WPW status post ablation, PAT/SVT, PVCs, Addison's disease, transverse myelitis, neuromyelitis, and brief atrial fibrillation here for evaluation of coronary calcification.  She has seen Dr. Sage for years.  She wore an event monitor in 2022 for palpitations and was found to have blocked PACs with pauses and junctional escape.  Discussed the use of AI scribe software for clinical note transcription with the patient, who gave verbal consent to proceed.  History of Present Illness [name] was referred by her primary care physician for concerns about her calcium  score and arrhythmic episodes.  She experiences episodes of arrhythmia, including supraventricular tachycardia and atrial fibrillation, approximately once a week. These episodes cause her heart rate to reach up to 150 bpm, lasting from 30 seconds to 10 minutes. Previous ambulatory monitoring has captured brief episodes, but not consistently due to their short duration.  She has a history of transverse myelitis and neuromyelitis optica, which significantly impact her daily life. Severe fatigue limits her active hours to about four per day, primarily in the morning. Stress due to family issues has affected her exercise routine. She is attempting to resume physical therapy exercises to strengthen her thighs and core, as she has no feeling from the knee down.  She experiences shortness of breath during physical activity and sometimes while lying down. She also reports chest pain, primarily in the back and across the side, and persistent swelling in her legs and feet. Her blood pressure is generally low, and she monitors  it daily, adjusting her medication based on the readings. She takes telmisartan  hydrochlorothiazide  when her blood pressure is high and carvedilol  regularly. She uses Lasix  as needed for swelling, approximately twice a week.  She describes episodes of 'crashes' where both her blood pressure and pulse drop, which she manages with salt intake and Diet Coke to prevent passing out. She has a family history of heart disease, with her mother having had congestive heart failure and her brother having an unspecified heart condition.  She reports worsening shortness of breath compared to the previous year and has a history of TIAs. She has undergone multiple joint replacements and experiences significant pain, particularly in her shoulder, which affects her blood pressure readings.   ROS:  As per HPI  Studies Reviewed: .       Calcium  score 01/17/2024: IMPRESSION:   Coronary calcium  score of 44. This was 28 percentile for age-, race-,   and sex-matched controls.  7 Day Zio Monitor: 05/2023:   Impression:   Sinus rhythm  Rates 50 to 107 bpm   Average HR 66 bpm Rare PAC, rare PVC    Rare short bursts of SVT (31), fastest for 5 beats at 176 bpm; longest for 15 beats at 131 bpm   Triggered events/diary entries correlated with SR, SR with PACs The short bursts of SVT were not detected  Echo 12/2022:  1. Left ventricular ejection fraction, by estimation, is 60 to 65%. The  left ventricle has normal function. The left ventricle has no regional  wall motion abnormalities. Left ventricular diastolic parameters were  normal. The average left ventricular  global longitudinal strain is -19.2 %.  The global longitudinal strain is  normal.   2. Right ventricular systolic function is normal. The right ventricular  size is normal.   3. The mitral valve is normal in structure. Trivial mitral valve  regurgitation. No evidence of mitral stenosis.   4. The aortic valve has an indeterminant number of cusps.  Aortic valve  regurgitation is mild. No aortic stenosis is present.   5. The inferior vena cava is normal in size with greater than 50%  respiratory variability, suggesting right atrial pressure of 3 mmHg.    Risk Assessment/Calculations:             Physical Exam:   VS:  BP (!) 90/50   Pulse (!) 58   Ht 5' 5 (1.651 m)   Wt (!) 305 lb (138.3 kg)   SpO2 96%   BMI 50.75 kg/m  , BMI Body mass index is 50.75 kg/m. GENERAL:  No acute distress.  Grimace from R shoulder pain.  HEENT: Pupils equal round and reactive, fundi not visualized, oral mucosa unremarkable NECK:  No jugular venous distention, waveform within normal limits, carotid upstroke brisk and symmetric, no bruits, no thyromegaly LUNGS:  Clear to auscultation bilaterally HEART:  RRR.  PMI not displaced or sustained,S1 and S2 within normal limits, no S3, no S4, no clicks, no rubs, no murmurs ABD:  Flat, positive bowel sounds normal in frequency in pitch, no bruits, no rebound, no guarding, no midline pulsatile mass, no hepatomegaly, no splenomegaly EXT:  2 plus pulses throughout, tense LE edema, No pitting.  no cyanosis no clubbing SKIN:  No rashes no nodules NEURO:  Cranial nerves II through XII grossly intact, motor grossly intact throughout PSYCH:  Cognitively intact, oriented to person place and time   ASSESSMENT AND PLAN: .    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 Symptoms suggest possible coronary artery disease, but she has many reasons to be short of breath.  Unable to exercise to better understand whether her symptoms are ischemic. Discussed statins' benefits and alternatives. - Ordered coronary CT-A to better assess for obstructive CAD - Prescribed rosuvastatin  10 mg daily. - Repeat lipids/CMP in 2-3 months - Ordered repeat echocardiogram to assess heart function.  # PAF: # SVT: # PAT: Experiences weekly episodes of  palpitations.  She previously had <14min of afib on monitor.  Follows with EP and hasn't been seen since 07/2023 with plans for 4 months follow up. - Will refer to electrophysiology team for follow-up. - Will consider diltiazem as needed for palpitations. -Continue carvedilol .  Essential hypertension Blood pressure well-controlled with current regimen. Discussed importance of tight control to prevent cardiovascular events. - Continue current antihypertensive regimen with telmisartan  hydrochlorothiazide  and carvedilol .  # Hyperlipidemia LDL cholesterol above target. Discussed statins' benefits and side effects. Agreed to try low-dose rosuvastatin . - Prescribed rosuvastatin  10 mg daily. - Will recheck cholesterol levels in a couple of months.  # Obstructive sleep apnea Reports snoring and sleep difficulties. Previous CPAP not tolerated. Discussed sleep apnea's potential impact on heart rhythm. - Ordered home sleep study to assess for obstructive sleep apnea.          Dispo: f/u 2-3 months  Signed, Annabella Scarce, MD   "

## 2024-03-01 ENCOUNTER — Telehealth (HOSPITAL_BASED_OUTPATIENT_CLINIC_OR_DEPARTMENT_OTHER): Payer: Self-pay

## 2024-03-01 NOTE — Telephone Encounter (Signed)
"  ° °  Pre-operative Risk Assessment    Patient Name: Connie Haynes  DOB: [birth year] MRN: [id number]   Date of last office visit: 02/24/24 with [facility]  Date of next office visit: 05/29/24 with [facility]  Request for Surgical Clearance    Procedure:  Right Revision Reverse TSA  Date of Surgery:  Clearance 03/13/24                                  Surgeon:  Dr. Kay Socks Group or Practice Name:  [facility]  Phone number:  [phone] Fax number:  [phone]   Type of Clearance Requested:   - Medical    Type of Anesthesia:  Choice   Additional requests/questions:    SignedAugustin JONETTA Daring   03/01/2024, 3:26 PM   "

## 2024-03-01 NOTE — Telephone Encounter (Signed)
 Dr. Raford,  You saw this patient on 02/24/2024. You ordered a coronary CTA and echo. Her coronary CTA is scheduled for 1/27 but her echo is not scheduled until 2/12. She is pending shoulder replacement on  2/2. Will she need to undergo echo before preoperative risk assessment can be finalized?   Please route your response to P CV DIV Preop. I will communicate with requesting office once you have given recommendations.   Thank you!  Barnie Hila, NP

## 2024-03-02 ENCOUNTER — Telehealth: Payer: Self-pay | Admitting: Neurology

## 2024-03-02 ENCOUNTER — Other Ambulatory Visit

## 2024-03-02 LAB — BASIC METABOLIC PANEL WITH GFR
BUN/Creatinine Ratio: 24 (ref 12–28)
BUN: 23 mg/dL (ref 8–27)
CO2: 22 mmol/L (ref 20–29)
Calcium: 9.7 mg/dL (ref 8.7–10.3)
Chloride: 104 mmol/L (ref 96–106)
Creatinine, Ser: 0.97 mg/dL (ref 0.57–1.00)
Glucose: 85 mg/dL (ref 70–99)
Potassium: 4.3 mmol/L (ref 3.5–5.2)
Sodium: 144 mmol/L (ref 134–144)
eGFR: 63 mL/min/1.73

## 2024-03-02 NOTE — Telephone Encounter (Signed)
 I'm fine with cardiology signing off their part and cardiac meds. They also have labs pending.  She needs to get instructions from whoever is prescribing the rituximab  to know how long to hold prior to surgery.

## 2024-03-02 NOTE — Telephone Encounter (Signed)
 Received clearance request from [facility] for right revision reverse TSA with choice anesthesia. Asking for any medication instructions.   She does have cardiology. Do you need appointment for medical clearance? Last seen in November.

## 2024-03-03 ENCOUNTER — Encounter (HOSPITAL_COMMUNITY): Payer: Self-pay

## 2024-03-03 NOTE — Progress Notes (Signed)
 Sent message, via epic in basket, requesting orders in epic from Careers adviser.

## 2024-03-03 NOTE — Telephone Encounter (Signed)
 Clearance completed and faxed back with confirmation received.

## 2024-03-06 ENCOUNTER — Ambulatory Visit: Payer: Self-pay | Admitting: Cardiovascular Disease

## 2024-03-06 ENCOUNTER — Encounter: Payer: Self-pay | Admitting: Neurology

## 2024-03-06 NOTE — Patient Instructions (Signed)
 SURGICAL WAITING ROOM VISITATION  Patients having surgery or a procedure may have no more than 2 support people in the waiting area - these visitors may rotate.    Children ages 75 and under will not be able to visit patients in [facility] under most circumstances.   Visitors with respiratory illnesses are discouraged from visiting and should remain at home.  If the patient needs to stay at the hospital during part of their recovery, the visitor guidelines for inpatient rooms apply. Pre-op nurse will coordinate an appropriate time for 1 support person to accompany patient in pre-op.  This support person may not rotate.    Please refer to the [facility] website for the visitor guidelines for Inpatients (after your surgery is over and you are in a regular room).    Your procedure is scheduled on: 03/13/24   Report to [facility] Main Entrance    Report to admitting at 1:30 PM   Call this number if you have problems the morning of surgery [phone]   Do not eat food :After Midnight.   After Midnight you may have the following liquids until ______ AM/ PM DAY OF SURGERY  Water  Non-Citrus Juices (without pulp, NO RED-Apple, White grape, White cranberry) Black Coffee (NO MILK/CREAM OR CREAMERS, sugar ok)  Clear Tea (NO MILK/CREAM OR CREAMERS, sugar ok) regular and decaf                             Plain Jell-O (NO RED)                                           Fruit ices (not with fruit pulp, NO RED)                                     Popsicles (NO RED)                                                               Sports drinks like Gatorade (NO RED)              Drink 2 Ensure/G2 drinks AT 10:00 PM the night before surgery.        The day of surgery:  Drink ONE (1) Pre-Surgery Clear Ensure or G2 at AM the morning of surgery. Drink in one sitting. Do not sip.  This drink was given to you during your hospital  pre-op appointment visit. Nothing else to drink  after completing the  Pre-Surgery Clear Ensure or G2.          If you have questions, please contact your surgeons office.   FOLLOW BOWEL PREP AND ANY ADDITIONAL PRE OP INSTRUCTIONS YOU RECEIVED FROM YOUR SURGEON'S OFFICE!!!     Oral Hygiene is also important to reduce your risk of infection.                                    Remember - BRUSH YOUR TEETH THE  MORNING OF SURGERY WITH YOUR REGULAR TOOTHPASTE  DENTURES WILL BE REMOVED PRIOR TO SURGERY PLEASE DO NOT APPLY Poly grip OR ADHESIVES!!!   Do NOT smoke after Midnight   Stop all vitamins and herbal supplements 7 days before surgery.   Take these medicines the morning of surgery with A SIP OF WATER :   Carvedilol  (Coreg ) Citalopram  (Celexa ) Metoprolol  Tartrate (Lopressor ) Tramadol  (Ultram )  DO NOT TAKE ANY ORAL DIABETIC MEDICATIONS DAY OF YOUR SURGERY  Bring CPAP mask and tubing day of surgery.                              You may not have any metal on your body including hair pins, jewelry, and body piercing             Do not wear make-up, lotions, powders, perfumes, or deodorant  Do not wear nail polish including gel and S&S, artificial/acrylic nails, or any other type of covering on natural nails including finger and toenails. If you have artificial nails, gel coating, etc. that needs to be removed by a nail salon please have this removed prior to surgery or surgery may need to be canceled/ delayed if the surgeon/ anesthesia feels like they are unable to be safely monitored.   Do not shave  48 hours prior to surgery.    Do not bring valuables to the hospital. [facility] IS NOT             RESPONSIBLE   FOR VALUABLES.   Contacts, glasses, dentures or bridgework may not be worn into surgery.   Bring small overnight bag day of surgery.   DO NOT BRING YOUR HOME MEDICATIONS TO THE HOSPITAL. PHARMACY WILL DISPENSE MEDICATIONS LISTED ON YOUR MEDICATION LIST TO YOU DURING YOUR ADMISSION IN THE HOSPITAL!   Special  Instructions: Bring a copy of your healthcare power of attorney and living will documents the day of surgery if you haven't scanned them before.              Please read over the following fact sheets you were given: IF YOU HAVE QUESTIONS ABOUT YOUR PRE-OP INSTRUCTIONS PLEASE CALL [phone].   If you received a COVID test during your pre-op visit  it is requested that you wear a mask when out in public, stay away from anyone that may not be feeling well and notify your surgeon if you develop symptoms. If you test positive for Covid or have been in contact with anyone that has tested positive in the last 10 days please notify you surgeon.      Pre-operative 4 CHG Bath Instructions  DYNA-Hex 4 Chlorhexidine  Gluconate 4% Solution Antiseptic 4 fl. oz   You can play a key role in reducing the risk of infection after surgery. Your skin needs to be as free of germs as possible. You can reduce the number of germs on your skin by washing with CHG (chlorhexidine  gluconate) soap before surgery. CHG is an antiseptic soap that kills germs and continues to kill germs even after washing.   DO NOT use if you have an allergy  to chlorhexidine /CHG or antibacterial soaps. If your skin becomes reddened or irritated, stop using the CHG and notify one of our RNs at   Please shower with the CHG soap starting 4 days before surgery using the following schedule:     Please keep in mind the following:  DO NOT shave, including legs and underarms, starting  the day of your first shower.   You may shave your face at any point before/day of surgery.  Place clean sheets on your bed the day you start using CHG soap. Use a clean washcloth (not used since being washed) for each shower. DO NOT sleep with pets once you start using the CHG.  CHG Shower Instructions:  If you choose to wash your hair and private area, wash first with your normal shampoo/soap.  After you use shampoo/soap, rinse your hair and body  thoroughly to remove shampoo/soap residue.  Turn the water  OFF and apply about 3 tablespoons (45 ml) of CHG soap to a CLEAN washcloth.  Apply CHG soap ONLY FROM YOUR NECK DOWN TO YOUR TOES (washing for 3-5 minutes)  DO NOT use CHG soap on face, private areas, open wounds, or sores.  Pay special attention to the area where your surgery is being performed.  If you are having back surgery, having someone wash your back for you may be helpful. Wait 2 minutes after CHG soap is applied, then you may rinse off the CHG soap.  Pat dry with a clean towel  Put on clean clothes/pajamas   If you choose to wear lotion, please use ONLY the CHG-compatible lotions on the back of this paper.     Additional instructions for the day of surgery: DO NOT APPLY any lotions, deodorants, cologne, or perfumes.   Put on clean/comfortable clothes.  Brush your teeth.  Ask your nurse before applying any prescription medications to the skin.   CHG Compatible Lotions   Aveeno Moisturizing lotion  Cetaphil Moisturizing Cream  Cetaphil Moisturizing Lotion  Clairol Herbal Essence Moisturizing Lotion, Dry Skin  Clairol Herbal Essence Moisturizing Lotion, Extra Dry Skin  Clairol Herbal Essence Moisturizing Lotion, Normal Skin  Curel Age Defying Therapeutic Moisturizing Lotion with Alpha Hydroxy  Curel Extreme Care Body Lotion  Curel Soothing Hands Moisturizing Hand Lotion  Curel Therapeutic Moisturizing Cream, Fragrance-Free  Curel Therapeutic Moisturizing Lotion, Fragrance-Free  Curel Therapeutic Moisturizing Lotion, Original Formula  Eucerin Daily Replenishing Lotion  Eucerin Dry Skin Therapy Plus Alpha Hydroxy Crme  Eucerin Dry Skin Therapy Plus Alpha Hydroxy Lotion  Eucerin Original Crme  Eucerin Original Lotion  Eucerin Plus Crme Eucerin Plus Lotion  Eucerin TriLipid Replenishing Lotion  Keri Anti-Bacterial Hand Lotion  Keri Deep Conditioning Original Lotion Dry Skin Formula Softly Scented  Keri Deep  Conditioning Original Lotion, Fragrance Free Sensitive Skin Formula  Keri Lotion Fast Absorbing Fragrance Free Sensitive Skin Formula  Keri Lotion Fast Absorbing Softly Scented Dry Skin Formula  Keri Original Lotion  Keri Skin Renewal Lotion Keri Silky Smooth Lotion  Keri Silky Smooth Sensitive Skin Lotion  Nivea Body Creamy Conditioning Oil  Nivea Body Extra Enriched Teacher, Adult Education Moisturizing Lotion Nivea Crme  Nivea Skin Firming Lotion  NutraDerm 30 Skin Lotion  NutraDerm Skin Lotion  NutraDerm Therapeutic Skin Cream  NutraDerm Therapeutic Skin Lotion  ProShield Protective Hand Cream  Provon moisturizing lotion   WHAT IS A BLOOD TRANSFUSION? Blood Transfusion Information  A transfusion is the replacement of blood or some of its parts. Blood is made up of multiple cells which provide different functions. Red blood cells carry oxygen and are used for blood loss replacement. White blood cells fight against infection. Platelets control bleeding. Plasma helps clot blood. Other blood products are available for specialized needs, such as hemophilia or other clotting disorders. BEFORE THE TRANSFUSION  Who gives blood for transfusions?  Healthy volunteers who are fully evaluated to make sure their blood is safe. This is blood bank blood. Transfusion therapy is the safest it has ever been in the practice of medicine. Before blood is taken from a donor, a complete history is taken to make sure that person has no history of diseases nor engages in risky social behavior (examples are intravenous drug use or sexual activity with multiple partners). The donor's travel history is screened to minimize risk of transmitting infections, such as malaria. The donated blood is tested for signs of infectious diseases, such as HIV and hepatitis. The blood is then tested to be sure it is compatible with you in order to minimize the chance of a transfusion reaction. If  you or a relative donates blood, this is often done in anticipation of surgery and is not appropriate for emergency situations. It takes many days to process the donated blood. RISKS AND COMPLICATIONS Although transfusion therapy is very safe and saves many lives, the main dangers of transfusion include:  Getting an infectious disease. Developing a transfusion reaction. This is an allergic reaction to something in the blood you were given. Every precaution is taken to prevent this. The decision to have a blood transfusion has been considered carefully by your caregiver before blood is given. Blood is not given unless the benefits outweigh the risks. AFTER THE TRANSFUSION Right after receiving a blood transfusion, you will usually feel much better and more energetic. This is especially true if your red blood cells have gotten low (anemic). The transfusion raises the level of the red blood cells which carry oxygen, and this usually causes an energy increase. The nurse administering the transfusion will monitor you carefully for complications. HOME CARE INSTRUCTIONS  No special instructions are needed after a transfusion. You may find your energy is better. Speak with your caregiver about any limitations on activity for underlying diseases you may have. SEEK MEDICAL CARE IF:  Your condition is not improving after your transfusion. You develop redness or irritation at the intravenous (IV) site. SEEK IMMEDIATE MEDICAL CARE IF:  Any of the following symptoms occur over the next 12 hours: Shaking chills. You have a temperature by mouth above 102 F (38.9 C), not controlled by medicine. Chest, back, or muscle pain. People around you feel you are not acting correctly or are confused. Shortness of breath or difficulty breathing. Dizziness and fainting. You get a rash or develop hives. You have a decrease in urine output. Your urine turns a dark color or changes to pink, red, or brown. Any of the  following symptoms occur over the next 10 days: You have a temperature by mouth above 102 F (38.9 C), not controlled by medicine. Shortness of breath. Weakness after normal activity. The white part of the eye turns yellow (jaundice). You have a decrease in the amount of urine or are urinating less often. Your urine turns a dark color or changes to pink, red, or brown. Document Released: 01/24/2000 Document Revised: 04/20/2011 Document Reviewed: 09/12/2007 [facility] Patient Information 2014 [place], [place].  _______________________________________________________________________

## 2024-03-06 NOTE — Progress Notes (Signed)
 Date of COVID positive in last 90 days:  PCP - [name], NP Cardiologist - Annabella Richard, MD EP- Elspeth Sage, MD Endocrinologist- Elsie Sharps, MD Neurologist- Charlie Crete, MD  Chest CT- 09/20/23 Epic Chest x-ray - N/A EKG - 07/21/23 Epic brady at 56 Stress Test - 07/24/21 Epic ECHO - scheduled 03/07/24 Cardiac Cath - N/A Pacemaker/ICD device last checked:N/A Spinal Cord Stimulator:N/A  Bowel Prep - N/A  Sleep Study - N/A CPAP -   Fasting Blood Sugar - N/A Checks Blood Sugar _____ times a day  Last dose of GLP1 agonist-  N/A GLP1 instructions:  Do not take after     Last dose of SGLT-2 inhibitors-  N/A SGLT-2 instructions:  Do not take after     Blood Thinner Instructions: N/A Last dose:   Time: Aspirin  Instructions:N/A Last Dose:  Activity level:  Can go up a flight of stairs and perform activities of daily living without stopping and without symptoms of chest pain or shortness of breath.  Able to exercise without symptoms  Unable to go up a flight of stairs without symptoms of     Anesthesia review: WPW, HTN, Coronary artery calcification, OSA, TIAs, a fib, 3mm nodule RLL, cardiac clearance pending ECHO  Patient denies shortness of breath, fever, cough and chest pain at PAT appointment  Patient verbalized understanding of instructions that were given to them at the PAT appointment. Patient was also instructed that they will need to review over the PAT instructions again at home before surgery.

## 2024-03-06 NOTE — Progress Notes (Signed)
 Please place orders for PAT appointment scheduled 03/07/24.

## 2024-03-07 ENCOUNTER — Encounter (HOSPITAL_COMMUNITY)
Admission: RE | Admit: 2024-03-07 | Discharge: 2024-03-07 | Disposition: A | Discharge status: Home / Self Care | Source: Ambulatory Visit | Attending: Orthopedic Surgery | Admitting: Orthopedic Surgery

## 2024-03-07 ENCOUNTER — Encounter (HOSPITAL_COMMUNITY): Payer: Self-pay

## 2024-03-07 ENCOUNTER — Ambulatory Visit (HOSPITAL_BASED_OUTPATIENT_CLINIC_OR_DEPARTMENT_OTHER)
Admission: RE | Admit: 2024-03-07 | Discharge: 2024-03-07 | Disposition: A | Discharge status: Home / Self Care | Source: Ambulatory Visit | Attending: Cardiovascular Disease | Admitting: Cardiovascular Disease

## 2024-03-07 ENCOUNTER — Other Ambulatory Visit: Payer: Self-pay

## 2024-03-07 VITALS — BP 121/67 | HR 52 | Temp 97.8°F | Resp 16 | Ht 65.0 in | Wt 300.0 lb

## 2024-03-07 DIAGNOSIS — I2510 Atherosclerotic heart disease of native coronary artery without angina pectoris: Secondary | ICD-10-CM

## 2024-03-07 DIAGNOSIS — R0609 Other forms of dyspnea: Secondary | ICD-10-CM | POA: Insufficient documentation

## 2024-03-07 DIAGNOSIS — I10 Essential (primary) hypertension: Secondary | ICD-10-CM | POA: Insufficient documentation

## 2024-03-07 DIAGNOSIS — Z01818 Encounter for other preprocedural examination: Secondary | ICD-10-CM

## 2024-03-07 HISTORY — DX: Heart failure, unspecified: I50.9

## 2024-03-07 MED ORDER — NITROGLYCERIN 0.4 MG SL SUBL
SUBLINGUAL_TABLET | SUBLINGUAL | Status: AC
  Administered 2024-03-07: 0.8 mg via SUBLINGUAL
  Filled 2024-03-07: qty 10

## 2024-03-07 MED ORDER — IOHEXOL 350 MG/ML SOLN
100.0000 mL | Freq: Once | INTRAVENOUS | Status: AC | PRN
  Administered 2024-03-07: 110 mL via INTRAVENOUS

## 2024-03-07 MED ORDER — NITROGLYCERIN 0.4 MG SL SUBL: 0.8000 mg | SUBLINGUAL_TABLET | Freq: Once | SUBLINGUAL | Status: AC

## 2024-03-07 NOTE — H&P (Signed)
 " Patient's anticipated LOS is less than 2 midnights, meeting these requirements: - Younger than 14 - Lives within 1 hour of care - Has a competent adult at home to recover with post-op recover - NO history of  - Chronic pain requiring opiods  - Diabetes  - Coronary Artery Disease  - Heart failure  - Heart attack  - Stroke  - DVT/VTE  - Cardiac arrhythmia  - Respiratory Failure/COPD  - Renal failure  - Anemia  - Advanced Liver disease     Connie Haynes is an 69 y.o. female.    Chief Complaint: right shoulder pain  HPI: Pt is a 69 y.o. female complaining of right shoulder pain for multiple years. Pain had continually increased since the beginning. X-rays in the clinic show reverse total shoulder replacement.Various options are discussed with the patient. Risks, benefits and expectations were discussed with the patient. Patient understand the risks, benefits and expectations and wishes to proceed with surgery.   PCP:  [name], NP  D/C Plans: Home  PMH: Past Medical History:  Diagnosis Date   A-fib [facility])    Adrenal insufficiency    Anemia    denies   Anxiety    Basal cell carcinoma    eye lid   Cataract    CHF (congestive heart failure) (HCC)    Chronic kidney disease, stage 3 (HCC)    Cough    hx. dry cough chronic   Disorder of tear duct system    frequent tearing of eyes   GERD (gastroesophageal reflux disease)    H/O: whooping cough 2000   History of kidney infection 2015   ? sepsis   History of kidney stones    History of kidney stones    Hyperlipidemia    Hypertension    IBS (irritable bowel syndrome)    controlled with med   Osteoarthritis    arthritis -joints-limited ROM shoulders more on right   PONV (postoperative nausea and vomiting)     per pt hx of low BP following shoulder surgery in 05-2015; was managed in hospital with IV fluids and d/c BP being taken a tthe time; pt was dced and F/U with endcrinoopgist who dx with adrenal insurffiency and  started steroid therapy; treatmetnt with positive outome, now managed chronically on hydrocortisone  tablets;   Sleep apnea    no longer using cpap   Stroke (HCC)    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 Windy)   APPENDECTOMY     CARDIAC ELECTROPHYSIOLOGY STUDY AND ABLATION     '93 for WPW syndrome   CARPAL TUNNEL RELEASE Right 12/21/2014   Procedure: RIGHT CARPAL TUNNEL RELEASE ;  Surgeon: Marcey Her, MD;  [facility];  Service: Orthopedics;  Laterality: Right;   CARPAL TUNNEL RELEASE Left    CERVICAL SPINE SURGERY     cervical fusion with hardware retained   COLON SURGERY     colon detached from abdomen   COLONOSCOPY WITH PROPOFOL  N/A 05/10/2014   Procedure: COLONOSCOPY WITH PROPOFOL ;  Surgeon: Renaye Sous, MD;  [facility];  Service: Endoscopy;  Laterality: N/A;   CYSTOSCOPY/RETROGRADE/URETEROSCOPY Left 12/18/2013   Procedure: CYSTOSCOPY/RETROGRADE/LEFT STENT;  Surgeon: Norleen JINNY Seltzer, MD;  [facility];  Service: Urology;  Laterality: Left;   ESOPHAGOGASTRODUODENOSCOPY (EGD) WITH PROPOFOL  N/A 05/10/2014   Procedure: ESOPHAGOGASTRODUODENOSCOPY (EGD) WITH PROPOFOL ;  Surgeon: Renaye Sous, MD;  [facility];  Service: Endoscopy;  Laterality: N/A;   EYE SURGERY     EYELID CARCINOMA EXCISION     INSERTION OF DIALYSIS CATHETER N/A 08/16/2019   Procedure: INSERTION OF DIALYSIS CATHETER;  Surgeon: Brenna Adine CROME, DO;  [facility];  Service: Pulmonary;  Laterality: N/A;   JOINT REPLACEMENT     KNEE ARTHROSCOPY Left  07/16/2016   REVERSE SHOULDER ARTHROPLASTY Right 06/29/2014   Procedure: REVERSE TOTAL SHOULDER ARTHROPLASTY;  Surgeon: Marcey Her, MD;  Location: [facility] OR;  Service: Orthopedics;  Laterality: Right;   REVISION TOTAL SHOULDER TO REVERSE TOTAL SHOULDER Left 05/15/2015   REVISION TOTAL SHOULDER TO REVERSE TOTAL SHOULDER Left 05/15/2015   Procedure: LEFT SHOULDER REVISION TOTAL SHOULDER ARTHRPLASTY TO REVERSE TOTAL SHOULDER ARTHROPLASTY ;  Surgeon: Marcey Her, MD;  [facility];  Service: Orthopedics;  Laterality: Left;   SPINE SURGERY     cervical   tear duct surgery     TOTAL ABDOMINAL HYSTERECTOMY     TOTAL HIP ARTHROPLASTY     x 3(rt x2, lt x1)   TOTAL KNEE ARTHROPLASTY Right 08/08/2013   Procedure: RIGHT TOTAL KNEE ARTHROPLASTY WITH SAPHENOUS NERVE SCAR EXCISION;  Surgeon: Donnice JONETTA Car, MD;  [facility];  Service: Orthopedics;  Laterality: Right;   TOTAL KNEE ARTHROPLASTY Left 08/25/2016   Procedure: LEFT TOTAL KNEE ARTHROPLASTY;  Surgeon: Car Donnice, MD;  [facility];  Service: Orthopedics;  Laterality: Left;  70 mins; Adductor block   TOTAL SHOULDER ARTHROPLASTY Left 07/15/2012   Procedure: LEFT TOTAL SHOULDER ARTHROPLASTY;  Surgeon: [name], MD;  Location[facility] OR;  Service: Orthopedics;  Laterality: Left;   TOTAL SHOULDER REVISION Right 12/21/2014   Procedure: RIGHT SHOULDER POLY EXCHANGE ;  Surgeon: Marcey Her, MD;  Location: [facility] OR;  Service: Orthopedics;  Laterality: Right;   URETEROSCOPY  stent placement    Social History:  reports that she has never smoked. She has never used smokeless tobacco. She reports that she does not drink alcohol and does not use drugs. BMI: Estimated body mass index is 50.75 kg/m as calculated from the following:   Height as of 03/07/24: 5' 5 (1.651 m).   Weight as of 02/24/24: 138.3 kg.  Lab Results  Component Value Date   ALBUMIN  4.2 05/25/2023   Diabetes: Patient does not have a diagnosis of diabetes.     Smoking  Status:   reports that she has never smoked. She has never used smokeless tobacco.    Allergies:  Allergies[1]  Medications: No current facility-administered medications for this encounter.   Current Outpatient Medications  Medication Sig Dispense Refill   amoxicillin  (AMOXIL ) 500 MG capsule Take 2,000 mg by mouth See admin instructions. 1 hour prior to dental procedure     carvedilol  (COREG ) 25 MG tablet Take 1 tablet by mouth twice daily 180 tablet 0   citalopram  (CELEXA ) 40 MG tablet Take 1 tablet (40 mg total) by mouth daily. 90 tablet 3   fosfomycin  (MONUROL ) 3 g PACK TAKE 3 GRAMS BY MOUTH EVERY 10 DAYS 3 each 6   furosemide  (LASIX ) 40 MG tablet TAKE 2 TABLETS BY MOUTH  EVERY OTHER DAY (Patient taking differently: Take 80 mg by mouth daily as needed for fluid.) 90 tablet 3   hydrocortisone  (CORTEF ) 10 MG tablet Take 10 mg by mouth 2 (two) times daily. Take 10mg  in the AM and 10mg  at noon.     LINZESS  72 MCG capsule Take 1 capsule (72 mcg total) by mouth daily before breakfast. (Patient taking differently: Take 72 mcg by mouth at bedtime.) 90 capsule 3   LORazepam  (ATIVAN ) 0.5 MG tablet Take 1 tablet by mouth twice daily as needed (Patient taking differently: Take 0.5 mg by mouth daily as needed (nerve pain).) 60 tablet 5   metoprolol  tartrate (LOPRESSOR ) 50 MG tablet Take 50 mg (1 tablet) TWO hours prior to CT scan. HOLD CARVEDILOL  MORNING OF CT 1 tablet 0   Multiple Vitamin (MULTIVITAMIN ADULT PO) Take 1 tablet by mouth daily.     potassium chloride  (KLOR-CON ) 10 MEQ tablet Take 10 mEq by mouth as needed. WITH LASIX      pramipexole  (MIRAPEX ) 1 MG tablet Take 1 tablet by mouth twice daily 180 tablet 0   telmisartan -hydrochlorothiazide  (MICARDIS  HCT) 80-12.5 MG tablet Take 1 tablet by mouth daily. 90 tablet 3   tiZANidine  (ZANAFLEX ) 4 MG tablet TAKE 1 TO 1 & 1/2 (ONE & ONE-HALF) TABLETS BY MOUTH THREE TIMES DAILY AS NEEDED (Patient taking differently: Take 4 mg by mouth at bedtime.  Patient takes 1 tablet every night at bedtime and 1 tablet in the afternoon if needed TAKE 1 TO 1 & 1/2 (ONE & ONE-HALF) TABLETS BY MOUTH THREE TIMES DAILY AS NEEDED) 270 tablet 3   traMADol  (ULTRAM ) 50 MG tablet Take 1 tablet (50 mg total) by mouth every 8 (eight) hours. 90 tablet 5   Vitamin D , Ergocalciferol , (DRISDOL) 1.25 MG (50000 UNIT) CAPS capsule Take 50,000 Units by mouth once a week.     mupirocin  ointment (BACTROBAN ) 2 % Apply 1 Application topically 2 (two) times daily. (Patient not taking: No sig reported) 22 g 1   predniSONE  (DELTASONE ) 20 MG tablet Take 3 pills po the day before infusions. (Patient not taking: Reported on 03/02/2024) 12 tablet 0   riTUXimab  (RITUXAN IV) Inject into the vein.     rosuvastatin  (CRESTOR ) 10 MG tablet Take 1 tablet (10 mg total) by mouth daily. (Patient not taking: Reported on 03/02/2024) 90 tablet 3   Facility-Administered Medications Ordered in Other Encounters  Medication Dose Route Frequency Provider Last Rate Last Admin   chlorhexidine  (HIBICLENS ) 4 % liquid 4 application  60 mL Topical Once [place], Debby Cain, PA-C       technetium tetrofosmin  (TC-MYOVIEW ) injection 31.7 milli Curie  31.7 millicurie Intravenous Once PRN Maranda Leim DEL, MD        No results found for this or any previous visit (from the past 48 hours). CT CORONARY MORPH W/CTA COR W/SCORE W/CA W/CM &/OR WO/CM Result Date: 03/07/2024 CLINICAL DATA:  Chest pain EXAM: Cardiac/Coronary CTA TECHNIQUE: A non-contrast, gated CT scan was obtained with axial slices of 2.5 mm through the heart for calcium  scoring. Calcium  scoring was performed using the Agatston method. A 120 kV prospective, gated, contrast cardiac CT scan was obtained. Gantry rotation speed was 230 msec and collimation was 0.63 mm. Two sublingual nitroglycerin  tablets (0.8 mg) were given. The 3D data set was reconstructed with motion correction for the best systolic or diastolic phase. Images were analyzed on a dedicated  workstation using MPR, MIP, and VRT modes. The patient received 95 cc of contrast. FINDINGS: Image  quality: Excellent. Noise artifact is: Limited. Coronary Arteries:  Normal coronary origin.  Left dominance. Left main: The left main is a large caliber vessel with a normal take off from the left coronary cusp that bifurcates to form a left anterior descending artery and a left circumflex artery. There is no plaque or stenosis. Left anterior descending artery: The proximal and mid LAD segments contain mild calcified plaque (25-49%). The distal LAD is patent. The LAD gives off 1 patent diagonal branch. Left circumflex artery: The LCX is dominant and patent with no evidence of plaque or stenosis. The LCX gives off 2 patent obtuse marginal branches. The LCX terminates as a patent PDA and PLV. Right coronary artery: The RCA is non-dominant with normal take off from the right coronary cusp. There is minimal non-calcified plaque (<25%). Right [facility]: Right atrial size is within normal limits. Right Ventricle: The right ventricular cavity is within normal limits. Left [facility]: Left atrial size is normal in size with no left atrial appendage filling defect. Left Ventricle: The ventricular cavity size is within normal limits. Pulmonary arteries: Dilated pulmonary artery suggestive of pulmonary hypertension. Pulmonary veins: Normal pulmonary venous drainage. Pericardium: Normal thickness without significant effusion or calcium  present. Cardiac valves: The aortic valve is trileaflet without significant calcification. The mitral valve is normal without significant calcification. Aorta: Normal caliber without significant disease. Extra-cardiac findings: See attached radiology report for non-cardiac structures. IMPRESSION: 1. Coronary calcium  score of 50.9. This was 63rd percentile for age-, sex, and race-matched controls. 2. Normal coronary origin with left dominance. 3. Mild calcified plaque (25-49%) in the LAD. 4. Minimal  non-calcified plaque in the RCA (<25%). 5. Dilated pulmonary artery suggestive of pulmonary hypertension. RECOMMENDATIONS: 1. CAD-RADS 2: Mild non-obstructive CAD (25-49%). Consider non-atherosclerotic causes of chest pain. Consider preventive therapy and risk factor modification. Darryle Decent, MD Electronically Signed   By: Darryle Decent M.D.   On: 03/07/2024 11:11    ROS: Pain with rom of the right upper extremity  Physical Exam: Alert and oriented 69 y.o. female in no acute distress Cranial nerves 2-12 intact Cervical spine: full rom with no tenderness, nv intact distally Chest: active breath sounds bilaterally, no wheeze rhonchi or rales Heart: regular rate and rhythm, no murmur Abd: non tender non distended with active bowel sounds Hip is stable with rom  Right shoulder with reverse total shoulder and well healed incision No signs of infection or dvt  Assessment/Plan Assessment: right shoulder reverse total shoulder looseness  Plan:  Patient will undergo a right reverse total shoulder revision by Dr. Kay at [place] Risks benefits and expectations were discussed with the patient. Patient understand risks, benefits and expectations and wishes to proceed. Preoperative templating of the joint replacement has been completed, documented, and submitted to the Operating Room personnel in order to optimize intra-operative equipment management.   Arvella Fireman PA-C, MPAS [facility] is now [employer] [address], [address], [place], [place] [address] Phone: [phone] [email or web address]  [employer], [employer]/film/video        [1]  Allergies Allergen Reactions   Codeine Nausea And Vomiting    Can take if takes phernergan   Hydrocodone  Nausea Only   Lamotrigine      dizziness, shakiness and just feeling not right.   Macrobid  [Nitrofurantoin ] Diarrhea and Nausea And Vomiting   Robaxin  [Methocarbamol ] Rash   "

## 2024-03-08 ENCOUNTER — Encounter (HOSPITAL_COMMUNITY)
Admission: RE | Admit: 2024-03-08 | Discharge: 2024-03-08 | Disposition: A | Discharge status: Home / Self Care | Source: Ambulatory Visit | Attending: Orthopedic Surgery | Admitting: Orthopedic Surgery

## 2024-03-08 DIAGNOSIS — Z01818 Encounter for other preprocedural examination: Secondary | ICD-10-CM

## 2024-03-08 DIAGNOSIS — I2510 Atherosclerotic heart disease of native coronary artery without angina pectoris: Secondary | ICD-10-CM | POA: Diagnosis not present

## 2024-03-08 DIAGNOSIS — Z01812 Encounter for preprocedural laboratory examination: Secondary | ICD-10-CM | POA: Insufficient documentation

## 2024-03-08 LAB — CBC
HCT: 41 % (ref 36.0–46.0)
Hemoglobin: 12.9 g/dL (ref 12.0–15.0)
MCH: 30.4 pg (ref 26.0–34.0)
MCHC: 31.5 g/dL (ref 30.0–36.0)
MCV: 96.5 fL (ref 80.0–100.0)
Platelets: 266 10*3/uL (ref 150–400)
RBC: 4.25 MIL/uL (ref 3.87–5.11)
RDW: 12.9 % (ref 11.5–15.5)
WBC: 9.4 10*3/uL (ref 4.0–10.5)
nRBC: 0 % (ref 0.0–0.2)

## 2024-03-08 LAB — SURGICAL PCR SCREEN
MRSA, PCR: NEGATIVE
Staphylococcus aureus: NEGATIVE

## 2024-03-09 ENCOUNTER — Encounter (HOSPITAL_BASED_OUTPATIENT_CLINIC_OR_DEPARTMENT_OTHER): Payer: Self-pay | Admitting: Cardiovascular Disease

## 2024-03-09 NOTE — Telephone Encounter (Signed)
 Megan with [facility] called me in regard to seeing if we might be able to move echo up sooner. I stated I will start a secure chat with her and our admin echo team, Olam  to see if she can echo done sooner.    Olam was able to get pt on tomorrow 03/10/24 @ 2:30 for preop echo. Pt agreeable to plan of care.

## 2024-03-09 NOTE — Progress Notes (Signed)
 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 Patient last seen by Dr. Raford on 02/24/24. Per Dr. Raford: Symptoms suggest possible coronary artery disease, but she has many reasons to be short of breath.  Unable to exercise to better understand whether her symptoms are ischemic. A Coronary CTA was ordered which showed mild nonobstructive disease. Echo was ordered and showed normal LVEF 60-65%, mild-mod TR.  Patient follows with Neurology for hx of transverse myelitis and NMOSD. Last seen by Dr. Vear on 09/15/23. Noted to be stable without any exacerbations. She gets rituximab  infusions every 6 months.  Hx of adrenal insufficiency and follows with Endocrine at [facility]. Last seen on 01/19/24. At baseline, she is taking hydrocortisone  10 mg in the AM and 5 mg in the afternoon. Advised to increase dose if ill but otherwise continue current dosing and f/u in 6 months.   VS: BP 121/67   Pulse (!) 52   Temp 36.6 C (Oral)   Resp 16   Ht 5' 5 (1.651 m)   Wt 136.1 kg   SpO2 98%   BMI 49.92 kg/m   PROVIDERS: Almarie Waddell NOVAK, NP Cardiologist - Annabella Richard, MD EP- Elspeth Sage, MD Endocrinologist- Elsie Sharps,  MD Neurologist- Charlie Vear, MD  LABS: Labs reviewed: Acceptable for surgery. (all labs ordered are listed, but only abnormal results are displayed)  Labs Reviewed - No data to display  CT Chest 09/20/23:  IMPRESSION: 1. Stable 3 mm right lower lobe nodule. No further follow-up is required. 2. Aortic and coronary artery atherosclerosis. 3. 2.4 cm heterogeneous nodule in the right lobe of the thyroid. Nonemergent follow-up ultrasound is recommended. Reference: J Am Coll Radiol. 2015 Feb;12(2): 143-50.   EKG 07/21/23   Sinus bradycardia, rate 56    Echo 03/10/24:  IMPRESSIONS    1. Left ventricular ejection fraction, by estimation, is 60 to 65%. The left ventricle has normal function. The left ventricle has no regional wall motion abnormalities. Left ventricular diastolic parameters were normal.  2. Right ventricular systolic function is normal. The right ventricular size is normal. There is mildly elevated pulmonary artery systolic pressure. The estimated right ventricular systolic pressure is 44.0 mmHg.  3. Right atrial size was mildly dilated.  4. The mitral valve is normal in structure. Trivial mitral valve regurgitation. No evidence of mitral stenosis.  5. Tricuspid valve regurgitation is mild to moderate.  6. The aortic valve is tricuspid. Aortic valve regurgitation is trivial. No aortic stenosis is present.  7. The inferior vena cava is dilated in size with >50% respiratory variability, suggesting right atrial pressure of 8 mmHg.  Coronary CTA 03/07/24:  IMPRESSION: 1. Coronary calcium  score of 50.9. This was 63rd percentile for age-, sex, and race-matched controls.   2. Normal coronary origin with  left dominance.   3. Mild calcified plaque (25-49%) in the LAD.   4. Minimal non-calcified plaque in the RCA (<25%).   5. Dilated pulmonary artery suggestive of pulmonary hypertension.   RECOMMENDATIONS: 1. CAD-RADS 2: Mild non-obstructive CAD (25-49%).  Consider non-atherosclerotic causes of chest pain. Consider preventive therapy and risk factor modification.   Past Medical History:  Diagnosis Date   A-fib [facility])    Adrenal insufficiency    Anemia    denies   Anxiety    Basal cell carcinoma    eye lid   Cataract    CHF (congestive heart failure) (HCC)    Chronic kidney disease, stage 3 (HCC)    Cough    hx. dry cough chronic   Disorder of tear duct system    frequent tearing of eyes   GERD (gastroesophageal reflux disease)    H/O: whooping cough 2000   History of kidney infection 2015   ? sepsis   History of kidney stones    History of kidney stones    Hyperlipidemia    Hypertension    IBS (irritable bowel syndrome)    controlled with med   Osteoarthritis    arthritis -joints-limited ROM shoulders more on right   PONV (postoperative nausea and vomiting)     per pt hx of low BP following shoulder surgery in 05-2015; was managed in hospital with IV fluids and d/c BP being taken a tthe time; pt was dced and F/U with endcrinoopgist who dx with adrenal insurffiency and started steroid therapy; treatmetnt with positive outome, now managed chronically on hydrocortisone  tablets;   Sleep apnea    no longer using cpap   Stroke (HCC)    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 Windy)   APPENDECTOMY     CARDIAC ELECTROPHYSIOLOGY STUDY AND ABLATION     '93 for WPW syndrome   CARPAL TUNNEL RELEASE Right 12/21/2014   Procedure: RIGHT CARPAL TUNNEL RELEASE ;  Surgeon: Marcey Her, MD;  [facility];  Service:  Orthopedics;  Laterality: Right;   CARPAL TUNNEL RELEASE Left    CERVICAL SPINE SURGERY     cervical fusion with hardware retained   COLON SURGERY     colon detached from abdomen   COLONOSCOPY WITH PROPOFOL  N/A 05/10/2014   Procedure: COLONOSCOPY WITH PROPOFOL ;  Surgeon: Renaye Sous, MD;  [facility];  Service: Endoscopy;  Laterality: N/A;   CYSTOSCOPY/RETROGRADE/URETEROSCOPY Left 12/18/2013   Procedure: CYSTOSCOPY/RETROGRADE/LEFT STENT;  Surgeon: Norleen JINNY Seltzer, MD;  [facility];  Service: Urology;  Laterality: Left;   ESOPHAGOGASTRODUODENOSCOPY (EGD) WITH PROPOFOL  N/A 05/10/2014   Procedure: ESOPHAGOGASTRODUODENOSCOPY (EGD) WITH PROPOFOL ;  Surgeon: Renaye Sous, MD;  [facility];  Service: Endoscopy;  Laterality: N/A;   EYE SURGERY     EYELID CARCINOMA EXCISION     INSERTION OF DIALYSIS CATHETER N/A 08/16/2019   Procedure: INSERTION OF DIALYSIS CATHETER;  Surgeon: Brenna Adine CROME, DO;  [facility];  Service: Pulmonary;  Laterality: N/A;   JOINT REPLACEMENT     KNEE  ARTHROSCOPY Left 07/16/2016   REVERSE SHOULDER ARTHROPLASTY Right 06/29/2014   Procedure: REVERSE TOTAL SHOULDER ARTHROPLASTY;  Surgeon: Marcey Her, MD;  Location: [facility] OR;  Service: Orthopedics;  Laterality: Right;   REVISION TOTAL SHOULDER TO REVERSE TOTAL SHOULDER Left 05/15/2015   REVISION TOTAL SHOULDER TO REVERSE TOTAL SHOULDER Left 05/15/2015   Procedure: LEFT SHOULDER REVISION TOTAL SHOULDER ARTHRPLASTY TO REVERSE TOTAL SHOULDER ARTHROPLASTY ;  Surgeon: Marcey Her, MD;  [facility];  Service: Orthopedics;  Laterality: Left;   SPINE SURGERY     cervical   tear duct surgery     TOTAL ABDOMINAL HYSTERECTOMY     TOTAL HIP ARTHROPLASTY     x 3(rt x2, lt x1)   TOTAL KNEE ARTHROPLASTY Right 08/08/2013   Procedure: RIGHT TOTAL KNEE ARTHROPLASTY WITH SAPHENOUS NERVE SCAR EXCISION;  Surgeon: Donnice JONETTA Car, MD;  [facility];  Service: Orthopedics;  Laterality: Right;   TOTAL  KNEE ARTHROPLASTY Left 08/25/2016   Procedure: LEFT TOTAL KNEE ARTHROPLASTY;  Surgeon: Car Donnice, MD;  [facility];  Service: Orthopedics;  Laterality: Left;  70 mins; Adductor block   TOTAL SHOULDER ARTHROPLASTY Left 07/15/2012   Procedure: LEFT TOTAL SHOULDER ARTHROPLASTY;  Surgeon: [name], MD;  Location: [facility] OR;  Service: Orthopedics;  Laterality: Left;   TOTAL SHOULDER REVISION Right 12/21/2014   Procedure: RIGHT SHOULDER POLY EXCHANGE ;  Surgeon: Marcey Her, MD;  Location: [facility] OR;  Service: Orthopedics;  Laterality: Right;   URETEROSCOPY  stent placement    MEDICATIONS:  amoxicillin  (AMOXIL ) 500 MG capsule   carvedilol  (COREG ) 25 MG tablet   citalopram  (CELEXA ) 40 MG tablet   fosfomycin  (MONUROL ) 3 g PACK   furosemide  (LASIX ) 40 MG tablet   hydrocortisone  (CORTEF ) 10 MG tablet   LINZESS  72 MCG capsule   LORazepam  (ATIVAN ) 0.5 MG tablet   metoprolol  tartrate (LOPRESSOR ) 50 MG tablet   Multiple Vitamin (MULTIVITAMIN ADULT PO)   mupirocin  ointment (BACTROBAN ) 2 %   potassium chloride  (KLOR-CON ) 10 MEQ tablet   pramipexole  (MIRAPEX ) 1 MG tablet   predniSONE  (DELTASONE ) 20 MG tablet   riTUXimab  (RITUXAN IV)   rosuvastatin  (CRESTOR ) 10 MG tablet   telmisartan -hydrochlorothiazide  (MICARDIS  HCT) 80-12.5 MG tablet   tiZANidine  (ZANAFLEX ) 4 MG tablet   traMADol  (ULTRAM ) 50 MG tablet   Vitamin D , Ergocalciferol , (DRISDOL ) 1.25 MG (50000 UNIT) CAPS capsule    chlorhexidine  (HIBICLENS ) 4 % liquid 4 application   technetium tetrofosmin  (TC-MYOVIEW ) injection 31.7 milli Curie   Burnard CHRISTELLA Senna, [place] [facility] Surgical Short Stay/Anesthesiology [facility] Phone [phone] 03/10/2024 7:20 PM        "

## 2024-03-10 ENCOUNTER — Ambulatory Visit (HOSPITAL_COMMUNITY)
Admission: RE | Admit: 2024-03-10 | Discharge: 2024-03-10 | Disposition: A | Source: Ambulatory Visit | Attending: Cardiovascular Disease | Admitting: Cardiovascular Disease

## 2024-03-10 DIAGNOSIS — I2510 Atherosclerotic heart disease of native coronary artery without angina pectoris: Secondary | ICD-10-CM | POA: Diagnosis present

## 2024-03-10 DIAGNOSIS — I10 Essential (primary) hypertension: Secondary | ICD-10-CM | POA: Insufficient documentation

## 2024-03-10 LAB — ECHOCARDIOGRAM COMPLETE
Area-P 1/2: 3.53 cm2
S' Lateral: 3.1 cm

## 2024-03-10 NOTE — Anesthesia Preprocedure Evaluation (Signed)
 "                                  Anesthesia Evaluation  Patient identified by MRN, date of birth, ID band Patient awake    Reviewed: Allergy  & Precautions, NPO status , Patient's Chart, lab work & pertinent test results  History of Anesthesia Complications (+) PONV and history of anesthetic complications  Airway Mallampati: III  TM Distance: >3 FB Neck ROM: Full    Dental  (+) Teeth Intact, Dental Advisory Given   Pulmonary shortness of breath and with exertion, sleep apnea , neg COPD, neg recent URI   breath sounds clear to auscultation + decreased breath sounds      Cardiovascular hypertension, Pt. on medications and Pt. on home beta blockers (-) angina + CAD, +CHF and + DOE   Rhythm:Regular   1. Left ventricular ejection fraction, by estimation, is 60 to 65%. The  left ventricle has normal function. The left ventricle has no regional  wall motion abnormalities. Left ventricular diastolic parameters were  normal.   2. Right ventricular systolic function is normal. The right ventricular  size is normal. There is mildly elevated pulmonary artery systolic  pressure. The estimated right ventricular systolic pressure is 44.0 mmHg.   3. Right atrial size was mildly dilated.   4. The mitral valve is normal in structure. Trivial mitral valve  regurgitation. No evidence of mitral stenosis.   5. Tricuspid valve regurgitation is mild to moderate.   6. The aortic valve is tricuspid. Aortic valve regurgitation is trivial.  No aortic stenosis is present.   7. The inferior vena cava is dilated in size with >50% respiratory  variability, suggesting right atrial pressure of 8 mmHg.      Neuro/Psych  Headaches PSYCHIATRIC DISORDERS Anxiety      Neuromuscular disease CVA, No Residual Symptoms    GI/Hepatic Neg liver ROS,GERD  ,,  Endo/Other  neg diabetes  Class 4 obesityAddison's (adrenal insufficient) did not take daily dose today.   Renal/GU CRFRenal diseaseLab  Results      Component                Value               Date                      NA                       144                 03/02/2024                K                        4.3                 03/02/2024                CO2                      22                  03/02/2024                GLUCOSE  85                  03/02/2024                BUN                      23                  03/02/2024                CREATININE               0.97                03/02/2024                CALCIUM                   9.7                 03/02/2024                GFR                      64.55               06/04/2022                EGFR                     63                  03/02/2024                GFRNONAA                 >60                 05/25/2023                Musculoskeletal  (+) Arthritis ,    Abdominal   Peds  Hematology negative hematology ROS (+) Lab Results      Component                Value               Date                      WBC                      9.4                 03/08/2024                HGB                      12.9                03/08/2024                HCT                      41.0                03/08/2024                MCV  96.5                03/08/2024                PLT                      266                 03/08/2024              Anesthesia Other Findings   Reproductive/Obstetrics                              Anesthesia Physical Anesthesia Plan  ASA: 4  Anesthesia Plan: General   Post-op Pain Management: Ofirmev  IV (intra-op)*   Induction: Intravenous  PONV Risk Score and Plan: 4 or greater and Ondansetron , Dexamethasone  and Propofol  infusion  Airway Management Planned: Oral ETT  Additional Equipment: ClearSight  Intra-op Plan:   Post-operative Plan: Extubation in OR  Informed Consent: I have reviewed the patients History and Physical, chart, labs and  discussed the procedure including the risks, benefits and alternatives for the proposed anesthesia with the patient or authorized representative who has indicated his/her understanding and acceptance.     Dental advisory given  Plan Discussed with:   Anesthesia Plan Comments: (See PAT note from 1/27  Hydrocortisone  50mg  IV prior to incision)         Anesthesia Quick Evaluation  "

## 2024-03-13 ENCOUNTER — Observation Stay (HOSPITAL_COMMUNITY)
Admission: RE | Admit: 2024-03-13 | Discharge: 2024-03-15 | DRG: 483 | Disposition: A | Attending: Orthopedic Surgery | Admitting: Orthopedic Surgery

## 2024-03-13 ENCOUNTER — Ambulatory Visit (HOSPITAL_COMMUNITY): Payer: Self-pay | Admitting: Anesthesiology

## 2024-03-13 ENCOUNTER — Ambulatory Visit (HOSPITAL_COMMUNITY): Payer: Self-pay | Admitting: Medical

## 2024-03-13 ENCOUNTER — Encounter (HOSPITAL_COMMUNITY): Payer: Self-pay | Admitting: Orthopedic Surgery

## 2024-03-13 ENCOUNTER — Other Ambulatory Visit: Payer: Self-pay

## 2024-03-13 ENCOUNTER — Encounter (HOSPITAL_COMMUNITY): Admission: RE | Disposition: A | Payer: Self-pay | Source: Home / Self Care | Attending: Orthopedic Surgery

## 2024-03-13 ENCOUNTER — Ambulatory Visit (HOSPITAL_COMMUNITY)

## 2024-03-13 DIAGNOSIS — I10 Essential (primary) hypertension: Secondary | ICD-10-CM | POA: Diagnosis present

## 2024-03-13 DIAGNOSIS — Z8249 Family history of ischemic heart disease and other diseases of the circulatory system: Secondary | ICD-10-CM

## 2024-03-13 DIAGNOSIS — F32A Depression, unspecified: Secondary | ICD-10-CM | POA: Diagnosis present

## 2024-03-13 DIAGNOSIS — E271 Primary adrenocortical insufficiency: Secondary | ICD-10-CM | POA: Diagnosis not present

## 2024-03-13 DIAGNOSIS — Z96612 Presence of left artificial shoulder joint: Secondary | ICD-10-CM | POA: Diagnosis present

## 2024-03-13 DIAGNOSIS — Z9049 Acquired absence of other specified parts of digestive tract: Secondary | ICD-10-CM

## 2024-03-13 DIAGNOSIS — I2510 Atherosclerotic heart disease of native coronary artery without angina pectoris: Secondary | ICD-10-CM | POA: Diagnosis present

## 2024-03-13 DIAGNOSIS — Z87442 Personal history of urinary calculi: Secondary | ICD-10-CM

## 2024-03-13 DIAGNOSIS — G0491 Myelitis, unspecified: Secondary | ICD-10-CM | POA: Diagnosis not present

## 2024-03-13 DIAGNOSIS — Z8673 Personal history of transient ischemic attack (TIA), and cerebral infarction without residual deficits: Secondary | ICD-10-CM

## 2024-03-13 DIAGNOSIS — I480 Paroxysmal atrial fibrillation: Secondary | ICD-10-CM

## 2024-03-13 DIAGNOSIS — Z85828 Personal history of other malignant neoplasm of skin: Secondary | ICD-10-CM

## 2024-03-13 DIAGNOSIS — Z885 Allergy status to narcotic agent status: Secondary | ICD-10-CM

## 2024-03-13 DIAGNOSIS — I4891 Unspecified atrial fibrillation: Secondary | ICD-10-CM

## 2024-03-13 DIAGNOSIS — I110 Hypertensive heart disease with heart failure: Secondary | ICD-10-CM

## 2024-03-13 DIAGNOSIS — Z888 Allergy status to other drugs, medicaments and biological substances status: Secondary | ICD-10-CM

## 2024-03-13 DIAGNOSIS — Z90710 Acquired absence of both cervix and uterus: Secondary | ICD-10-CM

## 2024-03-13 DIAGNOSIS — I4819 Other persistent atrial fibrillation: Principal | ICD-10-CM | POA: Diagnosis present

## 2024-03-13 DIAGNOSIS — E785 Hyperlipidemia, unspecified: Secondary | ICD-10-CM | POA: Diagnosis present

## 2024-03-13 DIAGNOSIS — Z79620 Long term (current) use of immunosuppressive biologic: Secondary | ICD-10-CM

## 2024-03-13 DIAGNOSIS — G369 Acute disseminated demyelination, unspecified: Secondary | ICD-10-CM | POA: Diagnosis present

## 2024-03-13 DIAGNOSIS — Z96611 Presence of right artificial shoulder joint: Secondary | ICD-10-CM

## 2024-03-13 DIAGNOSIS — I509 Heart failure, unspecified: Secondary | ICD-10-CM

## 2024-03-13 DIAGNOSIS — I959 Hypotension, unspecified: Secondary | ICD-10-CM | POA: Diagnosis not present

## 2024-03-13 DIAGNOSIS — Z87440 Personal history of urinary (tract) infections: Secondary | ICD-10-CM

## 2024-03-13 DIAGNOSIS — Z79899 Other long term (current) drug therapy: Secondary | ICD-10-CM

## 2024-03-13 DIAGNOSIS — Z96653 Presence of artificial knee joint, bilateral: Secondary | ICD-10-CM | POA: Diagnosis present

## 2024-03-13 DIAGNOSIS — M25311 Other instability, right shoulder: Principal | ICD-10-CM | POA: Diagnosis present

## 2024-03-13 DIAGNOSIS — Z825 Family history of asthma and other chronic lower respiratory diseases: Secondary | ICD-10-CM

## 2024-03-13 DIAGNOSIS — E2740 Unspecified adrenocortical insufficiency: Secondary | ICD-10-CM | POA: Diagnosis present

## 2024-03-13 DIAGNOSIS — Z6841 Body Mass Index (BMI) 40.0 and over, adult: Secondary | ICD-10-CM

## 2024-03-13 DIAGNOSIS — Z83438 Family history of other disorder of lipoprotein metabolism and other lipidemia: Secondary | ICD-10-CM

## 2024-03-13 LAB — TYPE AND SCREEN
ABO/RH(D): O POS
Antibody Screen: NEGATIVE

## 2024-03-13 MED ORDER — LIDOCAINE HCL (PF) 2 % IJ SOLN
INTRAMUSCULAR | Status: AC
  Filled 2024-03-13: qty 5

## 2024-03-13 MED ORDER — ONDANSETRON HCL 4 MG/2ML IJ SOLN
INTRAMUSCULAR | Status: AC
  Filled 2024-03-13: qty 2

## 2024-03-13 MED ORDER — ROSUVASTATIN CALCIUM 10 MG PO TABS
10.0000 mg | ORAL_TABLET | Freq: Every day | ORAL | Status: DC
  Administered 2024-03-14 – 2024-03-15 (×2): 10 mg via ORAL
  Filled 2024-03-13 (×2): qty 1

## 2024-03-13 MED ORDER — SODIUM CHLORIDE 0.9 % IV SOLN: INTRAVENOUS | Status: AC

## 2024-03-13 MED ORDER — SUGAMMADEX SODIUM 200 MG/2ML IV SOLN
INTRAVENOUS | Status: DC | PRN
  Administered 2024-03-13: 400 mg via INTRAVENOUS

## 2024-03-13 MED ORDER — ORAL CARE MOUTH RINSE: 15.0000 mL | Freq: Once | OROMUCOSAL | Status: AC

## 2024-03-13 MED ORDER — VITAMIN D (ERGOCALCIFEROL) 1.25 MG (50000 UNIT) PO CAPS
50000.0000 [IU] | ORAL_CAPSULE | ORAL | Status: DC
  Administered 2024-03-15: 50000 [IU] via ORAL
  Filled 2024-03-13: qty 1

## 2024-03-13 MED ORDER — HYDROCORTISONE 20 MG PO TABS
20.0000 mg | ORAL_TABLET | Freq: Two times a day (BID) | ORAL | Status: DC
  Administered 2024-03-14: 20 mg via ORAL
  Filled 2024-03-13 (×2): qty 1

## 2024-03-13 MED ORDER — PROPOFOL 10 MG/ML IV BOLUS
INTRAVENOUS | Status: AC
  Filled 2024-03-13: qty 20

## 2024-03-13 MED ORDER — OXYCODONE HCL 5 MG PO TABS: 5.0000 mg | ORAL_TABLET | Freq: Once | ORAL | Status: DC | PRN

## 2024-03-13 MED ORDER — CARVEDILOL 25 MG PO TABS: 25.0000 mg | ORAL_TABLET | Freq: Two times a day (BID) | ORAL | Status: DC

## 2024-03-13 MED ORDER — FENTANYL CITRATE (PF) 100 MCG/2ML IJ SOLN
INTRAMUSCULAR | Status: DC | PRN
  Administered 2024-03-13 (×2): 100 ug via INTRAVENOUS

## 2024-03-13 MED ORDER — LINACLOTIDE 72 MCG PO CAPS
72.0000 ug | ORAL_CAPSULE | Freq: Every day | ORAL | Status: DC
  Administered 2024-03-13 – 2024-03-14 (×2): 72 ug via ORAL
  Filled 2024-03-13 (×2): qty 1

## 2024-03-13 MED ORDER — METOCLOPRAMIDE HCL 5 MG/ML IJ SOLN
5.0000 mg | Freq: Three times a day (TID) | INTRAMUSCULAR | Status: DC | PRN
  Administered 2024-03-14: 10 mg via INTRAVENOUS
  Filled 2024-03-13: qty 2

## 2024-03-13 MED ORDER — PROPOFOL 1000 MG/100ML IV EMUL
INTRAVENOUS | Status: AC
  Filled 2024-03-13: qty 100

## 2024-03-13 MED ORDER — ACETAMINOPHEN 325 MG PO TABS
325.0000 mg | ORAL_TABLET | Freq: Four times a day (QID) | ORAL | Status: DC | PRN
  Administered 2024-03-14: 650 mg via ORAL
  Filled 2024-03-13: qty 2

## 2024-03-13 MED ORDER — SENNA 8.6 MG PO TABS
1.0000 | ORAL_TABLET | Freq: Every day | ORAL | Status: DC
  Administered 2024-03-13 – 2024-03-15 (×3): 8.6 mg via ORAL
  Filled 2024-03-13 (×3): qty 1

## 2024-03-13 MED ORDER — BUPIVACAINE-EPINEPHRINE (PF) 0.25% -1:200000 IJ SOLN
INTRAMUSCULAR | Status: AC
  Filled 2024-03-13: qty 30

## 2024-03-13 MED ORDER — ESMOLOL HCL 100 MG/10ML IV SOLN
INTRAVENOUS | Status: DC | PRN
  Administered 2024-03-13 (×2): 50 ug via INTRAVENOUS

## 2024-03-13 MED ORDER — FENTANYL CITRATE (PF) 50 MCG/ML IJ SOSY
25.0000 ug | PREFILLED_SYRINGE | INTRAMUSCULAR | Status: DC | PRN
  Administered 2024-03-13 (×2): 50 ug via INTRAVENOUS

## 2024-03-13 MED ORDER — TRANEXAMIC ACID-NACL 1000-0.7 MG/100ML-% IV SOLN
1000.0000 mg | Freq: Once | INTRAVENOUS | Status: AC
  Administered 2024-03-13: 1000 mg via INTRAVENOUS
  Filled 2024-03-13: qty 100

## 2024-03-13 MED ORDER — STERILE WATER FOR IRRIGATION IR SOLN
Status: DC | PRN
  Administered 2024-03-13: 2000 mL

## 2024-03-13 MED ORDER — FUROSEMIDE 40 MG PO TABS: 80.0000 mg | ORAL_TABLET | Freq: Every day | ORAL | Status: DC | PRN

## 2024-03-13 MED ORDER — HYDROCHLOROTHIAZIDE 12.5 MG PO TABS: 12.5000 mg | ORAL_TABLET | Freq: Every day | ORAL | Status: DC

## 2024-03-13 MED ORDER — PHENYLEPHRINE HCL-NACL 20-0.9 MG/250ML-% IV SOLN
INTRAVENOUS | Status: DC | PRN
  Administered 2024-03-13: 20 ug/min via INTRAVENOUS

## 2024-03-13 MED ORDER — HYDROCORTISONE SOD SUC (PF) 100 MG IJ SOLR
50.0000 mg | Freq: Once | INTRAMUSCULAR | Status: AC
  Administered 2024-03-13: 50 mg via INTRAVENOUS
  Filled 2024-03-13: qty 1

## 2024-03-13 MED ORDER — BUPIVACAINE-EPINEPHRINE (PF) 0.25% -1:200000 IJ SOLN
INTRAMUSCULAR | Status: DC | PRN
  Administered 2024-03-13: 20 mL

## 2024-03-13 MED ORDER — VANCOMYCIN HCL 1000 MG IV SOLR
INTRAVENOUS | Status: AC
  Filled 2024-03-13: qty 20

## 2024-03-13 MED ORDER — DILTIAZEM LOAD VIA INFUSION
10.0000 mg | Freq: Once | INTRAVENOUS | Status: AC
  Administered 2024-03-13: 10 mg via INTRAVENOUS
  Filled 2024-03-13: qty 10

## 2024-03-13 MED ORDER — 0.9 % SODIUM CHLORIDE (POUR BTL) OPTIME
TOPICAL | Status: DC | PRN
  Administered 2024-03-13: 1000 mL

## 2024-03-13 MED ORDER — LACTATED RINGERS IV SOLN: INTRAVENOUS | Status: DC

## 2024-03-13 MED ORDER — TRANEXAMIC ACID-NACL 1000-0.7 MG/100ML-% IV SOLN
1000.0000 mg | INTRAVENOUS | Status: AC
  Administered 2024-03-13: 1000 mg via INTRAVENOUS
  Filled 2024-03-13: qty 100

## 2024-03-13 MED ORDER — CEFAZOLIN SODIUM-DEXTROSE 3-4 GM/150ML-% IV SOLN
3.0000 g | INTRAVENOUS | Status: AC
  Administered 2024-03-13: 3 g via INTRAVENOUS
  Filled 2024-03-13: qty 150

## 2024-03-13 MED ORDER — ONDANSETRON HCL 4 MG/2ML IJ SOLN
INTRAMUSCULAR | Status: DC | PRN
  Administered 2024-03-13: 4 mg via INTRAVENOUS

## 2024-03-13 MED ORDER — CEFAZOLIN SODIUM-DEXTROSE 2-4 GM/100ML-% IV SOLN
2.0000 g | Freq: Four times a day (QID) | INTRAVENOUS | Status: AC
  Administered 2024-03-13 – 2024-03-14 (×2): 2 g via INTRAVENOUS
  Filled 2024-03-13 (×2): qty 100

## 2024-03-13 MED ORDER — ACETAMINOPHEN 10 MG/ML IV SOLN: 1000.0000 mg | Freq: Once | INTRAVENOUS | Status: DC | PRN

## 2024-03-13 MED ORDER — ESMOLOL HCL 100 MG/10ML IV SOLN
INTRAVENOUS | Status: AC
  Filled 2024-03-13: qty 10

## 2024-03-13 MED ORDER — MENTHOL 3 MG MT LOZG: 1.0000 | LOZENGE | OROMUCOSAL | Status: DC | PRN

## 2024-03-13 MED ORDER — LIDOCAINE HCL (PF) 2 % IJ SOLN
INTRAMUSCULAR | Status: DC | PRN
  Administered 2024-03-13: 100 mg via INTRADERMAL

## 2024-03-13 MED ORDER — SUGAMMADEX SODIUM 200 MG/2ML IV SOLN
INTRAVENOUS | Status: AC
  Filled 2024-03-13: qty 2

## 2024-03-13 MED ORDER — METOPROLOL TARTRATE 5 MG/5ML IV SOLN
INTRAVENOUS | Status: DC | PRN
  Administered 2024-03-13 (×2): 5 mg via INTRAVENOUS

## 2024-03-13 MED ORDER — ONDANSETRON HCL 4 MG PO TABS: 4.0000 mg | ORAL_TABLET | Freq: Four times a day (QID) | ORAL | Status: DC | PRN

## 2024-03-13 MED ORDER — TRAMADOL HCL 50 MG PO TABS
50.0000 mg | ORAL_TABLET | Freq: Four times a day (QID) | ORAL | Status: DC | PRN
  Administered 2024-03-14: 50 mg via ORAL
  Filled 2024-03-13: qty 1

## 2024-03-13 MED ORDER — ROCURONIUM BROMIDE 10 MG/ML (PF) SYRINGE
PREFILLED_SYRINGE | INTRAVENOUS | Status: AC
  Filled 2024-03-13: qty 10

## 2024-03-13 MED ORDER — HYDROMORPHONE HCL 1 MG/ML IJ SOLN
0.5000 mg | INTRAMUSCULAR | Status: DC | PRN
  Administered 2024-03-13 – 2024-03-14 (×5): 1 mg via INTRAVENOUS
  Filled 2024-03-13 (×5): qty 1

## 2024-03-13 MED ORDER — DEXAMETHASONE SOD PHOSPHATE PF 10 MG/ML IJ SOLN
INTRAMUSCULAR | Status: AC
  Filled 2024-03-13: qty 1

## 2024-03-13 MED ORDER — LORAZEPAM 0.5 MG PO TABS
0.5000 mg | ORAL_TABLET | Freq: Two times a day (BID) | ORAL | Status: DC | PRN
  Administered 2024-03-14 (×2): 0.5 mg via ORAL
  Filled 2024-03-13 (×2): qty 1

## 2024-03-13 MED ORDER — DILTIAZEM HCL-DEXTROSE 125-5 MG/125ML-% IV SOLN (PREMIX)
5.0000 mg/h | INTRAVENOUS | Status: DC
  Administered 2024-03-13: 5 mg/h via INTRAVENOUS
  Filled 2024-03-13: qty 125

## 2024-03-13 MED ORDER — IRBESARTAN 300 MG PO TABS
300.0000 mg | ORAL_TABLET | Freq: Every day | ORAL | Status: DC
  Administered 2024-03-14: 300 mg via ORAL
  Filled 2024-03-13: qty 1

## 2024-03-13 MED ORDER — PROPOFOL 10 MG/ML IV BOLUS
INTRAVENOUS | Status: DC | PRN
  Administered 2024-03-13: 75 ug/kg/min via INTRAVENOUS
  Administered 2024-03-13: 150 mg via INTRAVENOUS

## 2024-03-13 MED ORDER — AMOXICILLIN 500 MG PO CAPS: 2000.0000 mg | ORAL_CAPSULE | ORAL | Status: DC

## 2024-03-13 MED ORDER — VANCOMYCIN HCL 1 G IV SOLR
INTRAVENOUS | Status: DC | PRN
  Administered 2024-03-13: 1000 mg via TOPICAL

## 2024-03-13 MED ORDER — OXYCODONE HCL 5 MG PO TABS
5.0000 mg | ORAL_TABLET | ORAL | Status: DC | PRN
  Administered 2024-03-13 – 2024-03-14 (×2): 10 mg via ORAL
  Filled 2024-03-13 (×3): qty 2

## 2024-03-13 MED ORDER — PHENOL 1.4 % MT LIQD: 1.0000 | OROMUCOSAL | Status: DC | PRN

## 2024-03-13 MED ORDER — PRAMIPEXOLE DIHYDROCHLORIDE 1 MG PO TABS
1.0000 mg | ORAL_TABLET | Freq: Two times a day (BID) | ORAL | Status: DC
  Administered 2024-03-13 – 2024-03-15 (×4): 1 mg via ORAL
  Filled 2024-03-13 (×4): qty 1

## 2024-03-13 MED ORDER — ONDANSETRON HCL 4 MG/2ML IJ SOLN
4.0000 mg | Freq: Four times a day (QID) | INTRAMUSCULAR | Status: DC | PRN
  Administered 2024-03-13 – 2024-03-14 (×2): 4 mg via INTRAVENOUS
  Filled 2024-03-13 (×2): qty 2

## 2024-03-13 MED ORDER — PREDNISONE 20 MG PO TABS: 20.0000 mg | ORAL_TABLET | Freq: Two times a day (BID) | ORAL | Status: DC

## 2024-03-13 MED ORDER — DILTIAZEM LOAD VIA INFUSION: 10.0000 mg | Freq: Once | INTRAVENOUS | Status: DC | PRN

## 2024-03-13 MED ORDER — HYDROCORTISONE 10 MG PO TABS: 10.0000 mg | ORAL_TABLET | Freq: Two times a day (BID) | ORAL | Status: DC

## 2024-03-13 MED ORDER — FENTANYL CITRATE (PF) 100 MCG/2ML IJ SOLN
INTRAMUSCULAR | Status: AC
  Filled 2024-03-13: qty 2

## 2024-03-13 MED ORDER — OXYCODONE HCL 5 MG/5ML PO SOLN: 5.0000 mg | Freq: Once | ORAL | Status: DC | PRN

## 2024-03-13 MED ORDER — METOCLOPRAMIDE HCL 5 MG PO TABS: 5.0000 mg | ORAL_TABLET | Freq: Three times a day (TID) | ORAL | Status: DC | PRN

## 2024-03-13 MED ORDER — METOPROLOL TARTRATE 5 MG/5ML IV SOLN
INTRAVENOUS | Status: AC
  Filled 2024-03-13: qty 10

## 2024-03-13 MED ORDER — CHLORHEXIDINE GLUCONATE 0.12 % MT SOLN
15.0000 mL | Freq: Once | OROMUCOSAL | Status: AC
  Administered 2024-03-13: 15 mL via OROMUCOSAL

## 2024-03-13 MED ORDER — POLYETHYLENE GLYCOL 3350 17 G PO PACK: 17.0000 g | PACK | Freq: Every day | ORAL | Status: DC | PRN

## 2024-03-13 MED ORDER — CITALOPRAM HYDROBROMIDE 20 MG PO TABS
40.0000 mg | ORAL_TABLET | Freq: Every day | ORAL | Status: DC
  Administered 2024-03-14 – 2024-03-15 (×2): 40 mg via ORAL
  Filled 2024-03-13 (×2): qty 2

## 2024-03-13 MED ORDER — FENTANYL CITRATE (PF) 50 MCG/ML IJ SOSY
PREFILLED_SYRINGE | INTRAMUSCULAR | Status: AC
  Filled 2024-03-13: qty 2

## 2024-03-13 MED ORDER — ROCURONIUM BROMIDE 100 MG/10ML IV SOLN
INTRAVENOUS | Status: DC | PRN
  Administered 2024-03-13: 80 mg via INTRAVENOUS

## 2024-03-13 MED ORDER — CARVEDILOL 25 MG PO TABS
25.0000 mg | ORAL_TABLET | Freq: Two times a day (BID) | ORAL | Status: DC
  Administered 2024-03-13 – 2024-03-14 (×2): 25 mg via ORAL
  Filled 2024-03-13 (×2): qty 1

## 2024-03-13 MED ORDER — PRONTOSAN WOUND IRRIGATION OPTIME
TOPICAL | Status: DC | PRN
  Administered 2024-03-13: 1

## 2024-03-13 MED ORDER — TELMISARTAN-HCTZ 80-12.5 MG PO TABS: 1.0000 | ORAL_TABLET | Freq: Every day | ORAL | Status: DC

## 2024-03-13 MED ORDER — OXYCODONE HCL 5 MG PO TABS: 2.5000 mg | ORAL_TABLET | Freq: Four times a day (QID) | ORAL | 0 refills | Status: DC | PRN

## 2024-03-13 MED ORDER — TIZANIDINE HCL 4 MG PO TABS: 4.0000 mg | ORAL_TABLET | Freq: Three times a day (TID) | ORAL | Status: DC | PRN

## 2024-03-13 NOTE — Anesthesia Procedure Notes (Signed)
 Procedure Name: Intubation Date/Time: 03/13/2024 1:51 PM  Performed by: Augusta Daved SAILOR, CRNAPre-anesthesia Checklist: Patient identified, Emergency Drugs available, Suction available and Patient being monitored Patient Re-evaluated:Patient Re-evaluated prior to induction Oxygen Delivery Method: Circle System Utilized Preoxygenation: Pre-oxygenation with 100% oxygen Induction Type: IV induction Ventilation: Mask ventilation without difficulty and Oral airway inserted - appropriate to patient size Laryngoscope Size: Glidescope and 3 Grade View: Grade I Tube type: Oral Tube size: 7.0 mm Number of attempts: 1 Airway Equipment and Method: Stylet and Oral airway Placement Confirmation: ETT inserted through vocal cords under direct vision, positive ETCO2 and breath sounds checked- equal and bilateral Secured at: 21 (at the lip; ETT taped to the left side of mouth) cm Tube secured with: Tape Dental Injury: Teeth and Oropharynx as per pre-operative assessment

## 2024-03-13 NOTE — Interval H&P Note (Signed)
 History and Physical Interval Note:  03/13/2024 12:00 PM  Connie Haynes  has presented today for surgery, with the diagnosis of History of total arthroplasty of right shoulder.  The various methods of treatment have been discussed with the patient and family. After consideration of risks, benefits and other options for treatment, the patient has consented to  Procedures: REVISION, REVERSE TOTAL ARTHROPLASTY, SHOULDER (Right) as a surgical intervention.  The patient's history has been reviewed, patient examined, no change in status, stable for surgery.  I have reviewed the patient's chart and labs.  Questions were answered to the patient's satisfaction.     [name]

## 2024-03-13 NOTE — Telephone Encounter (Signed)
" ° °  Patient Name: Connie Haynes  DOB: 08/27/1955 MRN: [id number]  Primary Cardiologist: None  Chart reviewed as part of pre-operative protocol coverage. Pre-op clearance already addressed by colleagues in earlier phone notes. To summarize recommendations:  -She had echo 1/30 -Per Dr. Raford Southerly shows that your heart is squeezing well. The pressure in her lungs is elevated. It will be important for us  to get the sleep apnea treated. Continue the lasix .  Will route this bundled recommendation to requesting provider via Epic fax function and remove from pre-op pool. Please call with questions.  Mardy KATHEE Pizza, FNP 03/13/2024, 11:04 AM  "

## 2024-03-13 NOTE — Brief Op Note (Signed)
 03/13/2024 12:45 PM  3:30 PM  PATIENT:  Connie Haynes  69 y.o. 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  POST-OPERATIVE DIAGNOSIS:  Same  PROCEDURE:  Procedures: REVISION, REVERSE TOTAL ARTHROPLASTY, SHOULDER (Right)   SURGEON:  Surgeons and Role:    DEWAINE Kay Kemps, MD - Primary  PHYSICIAN ASSISTANT:   ASSISTANTS: Debby KATHEE Fireman, PA-C   ANESTHESIA:   local and general  EBL:  200 mL   BLOOD ADMINISTERED:none  DRAINS: none   LOCAL MEDICATIONS USED:  MARCAINE      SPECIMEN:  Source of specimen - fluid right shoulder to Micro for Aerobic and Anaerobic Cultures and routine gram stain  DISPOSITION OF SPECIMEN:  micro  COUNTS:  YES  TOURNIQUET:  * No tourniquets in log *  DICTATION: .Other Dictation: Dictation Number [id number]  PLAN OF CARE: Admit for overnight observation  PATIENT DISPOSITION:  PACU - hemodynamically stable.   Delay start of Pharmacological VTE agent (>24hrs) due to surgical blood loss or risk of bleeding: not applicable

## 2024-03-13 NOTE — Transfer of Care (Signed)
 Immediate Anesthesia Transfer of Care Note  Patient: Connie Haynes  Procedure(s) Performed: Procedures: REVISION, REVERSE TOTAL ARTHROPLASTY, SHOULDER (Right)  Patient Location: PACU  Anesthesia Type:General  Level of Consciousness:  sedated, patient cooperative and responds to stimulation  Airway & Oxygen Therapy:Patient Spontanous Breathing and Patient connected to face mask oxgen  Post-op Assessment:  Report given to PACU RN and Post -op Vital signs reviewed and stable  Post vital signs:  Reviewed and stable  Last Vitals:  Vitals:   03/13/24 0959  BP: 133/75  Pulse: (!) 58  Resp: 18  Temp: 36.8 C  SpO2: 96%    Complications: No apparent anesthesia complications

## 2024-03-14 ENCOUNTER — Encounter (HOSPITAL_COMMUNITY): Payer: Self-pay | Admitting: Orthopedic Surgery

## 2024-03-14 DIAGNOSIS — I4891 Unspecified atrial fibrillation: Secondary | ICD-10-CM

## 2024-03-14 DIAGNOSIS — Z96611 Presence of right artificial shoulder joint: Secondary | ICD-10-CM

## 2024-03-14 LAB — BASIC METABOLIC PANEL WITH GFR
Anion gap: 13 (ref 5–15)
BUN: 26 mg/dL — ABNORMAL HIGH (ref 8–23)
CO2: 24 mmol/L (ref 22–32)
Calcium: 8.8 mg/dL — ABNORMAL LOW (ref 8.9–10.3)
Chloride: 101 mmol/L (ref 98–111)
Creatinine, Ser: 1.27 mg/dL — ABNORMAL HIGH (ref 0.44–1.00)
GFR, Estimated: 45 mL/min — ABNORMAL LOW
Glucose, Bld: 107 mg/dL — ABNORMAL HIGH (ref 70–99)
Potassium: 4.1 mmol/L (ref 3.5–5.1)
Sodium: 138 mmol/L (ref 135–145)

## 2024-03-14 LAB — BLOOD GAS, VENOUS
Acid-Base Excess: 0.3 mmol/L (ref 0.0–2.0)
Bicarbonate: 27.9 mmol/L (ref 20.0–28.0)
O2 Saturation: 72.6 %
Patient temperature: 37
pCO2, Ven: 58 mmHg (ref 44–60)
pH, Ven: 7.29 (ref 7.25–7.43)
pO2, Ven: 41 mmHg (ref 32–45)

## 2024-03-14 LAB — GLUCOSE, CAPILLARY: Glucose-Capillary: 115 mg/dL — ABNORMAL HIGH (ref 70–99)

## 2024-03-14 LAB — TSH: TSH: 4.3 u[IU]/mL (ref 0.350–4.500)

## 2024-03-14 LAB — CBC
HCT: 40.9 % (ref 36.0–46.0)
Hemoglobin: 12.3 g/dL (ref 12.0–15.0)
MCH: 30 pg (ref 26.0–34.0)
MCHC: 30.1 g/dL (ref 30.0–36.0)
MCV: 99.8 fL (ref 80.0–100.0)
Platelets: 255 10*3/uL (ref 150–400)
RBC: 4.1 MIL/uL (ref 3.87–5.11)
RDW: 12.7 % (ref 11.5–15.5)
WBC: 9.6 10*3/uL (ref 4.0–10.5)
nRBC: 0 % (ref 0.0–0.2)

## 2024-03-14 LAB — MAGNESIUM: Magnesium: 2.3 mg/dL (ref 1.7–2.4)

## 2024-03-14 MED ORDER — APIXABAN 5 MG PO TABS
5.0000 mg | ORAL_TABLET | Freq: Two times a day (BID) | ORAL | Status: DC
  Administered 2024-03-15: 5 mg via ORAL
  Filled 2024-03-14: qty 1

## 2024-03-14 MED ORDER — HYDROXYZINE HCL 10 MG PO TABS
10.0000 mg | ORAL_TABLET | Freq: Three times a day (TID) | ORAL | Status: DC | PRN
  Administered 2024-03-14 (×2): 10 mg via ORAL
  Filled 2024-03-14 (×3): qty 1

## 2024-03-14 MED ORDER — DIPHENHYDRAMINE HCL 25 MG PO CAPS
25.0000 mg | ORAL_CAPSULE | Freq: Three times a day (TID) | ORAL | Status: DC | PRN
  Administered 2024-03-14: 25 mg via ORAL
  Filled 2024-03-14: qty 1

## 2024-03-14 MED ORDER — SODIUM CHLORIDE 0.9 % IV SOLN: Freq: Once | INTRAVENOUS | Status: AC

## 2024-03-14 MED ORDER — TRAMADOL HCL 50 MG PO TABS
50.0000 mg | ORAL_TABLET | Freq: Four times a day (QID) | ORAL | Status: DC | PRN
  Administered 2024-03-14 – 2024-03-15 (×2): 100 mg via ORAL
  Filled 2024-03-14 (×2): qty 2

## 2024-03-14 MED ORDER — HYDROCORTISONE SOD SUC (PF) 100 MG IJ SOLR
50.0000 mg | Freq: Three times a day (TID) | INTRAMUSCULAR | Status: DC
  Administered 2024-03-14 – 2024-03-15 (×4): 50 mg via INTRAVENOUS
  Filled 2024-03-14 (×4): qty 1

## 2024-03-14 MED ORDER — TIZANIDINE HCL 4 MG PO TABS
4.0000 mg | ORAL_TABLET | Freq: Three times a day (TID) | ORAL | Status: DC
  Administered 2024-03-14: 4 mg via ORAL
  Filled 2024-03-14: qty 1

## 2024-03-14 NOTE — Progress Notes (Addendum)
 PHARMACY - ANTICOAGULATION CONSULT NOTE  Pharmacy Consult for Apixaban  Indication: atrial fibrillation  Allergies[1]  Patient Measurements:    Vital Signs: Temp: 98.8 F (37.1 C) (02/03 0825) Temp Source: Oral (02/03 0825) BP: 120/73 (02/03 1400) Pulse Rate: 65 (02/03 1400)  Labs: Recent Labs    03/14/24 0414  HGB 12.3  HCT 40.9  PLT 255  CREATININE 1.27*    Estimated Creatinine Clearance: 57.7 mL/min (A) (by C-G formula based on SCr of 1.27 mg/dL (H)).   Medical History: Past Medical History:  Diagnosis Date   A-fib [facility])    Adrenal insufficiency    Anemia    denies   Anxiety    Basal cell carcinoma    eye lid   Cataract    CHF (congestive heart failure) (HCC)    Chronic kidney disease, stage 3 (HCC)    Cough    hx. dry cough chronic   Disorder of tear duct system    frequent tearing of eyes   GERD (gastroesophageal reflux disease)    H/O: whooping cough 2000   History of kidney infection 2015   ? sepsis   History of kidney stones    History of kidney stones    Hyperlipidemia    Hypertension    IBS (irritable bowel syndrome)    controlled with med   Osteoarthritis    arthritis -joints-limited ROM shoulders more on right   PONV (postoperative nausea and vomiting)     per pt hx of low BP following shoulder surgery in 05-2015; was managed in hospital with IV fluids and d/c BP being taken a tthe time; pt was dced and F/U with endcrinoopgist who dx with adrenal insurffiency and started steroid therapy; treatmetnt with positive outome, now managed chronically on hydrocortisone  tablets;   Sleep apnea    no longer using cpap   Stroke (HCC)    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  Assessment: Pharmacy consulted by Cards to dose apixaban  for atrial fibrillation (to start the morning of 2/4 per Surgery).  CHA2DS2-VASc score equals 3.  Age less than 80 years, weight greater than 60 kg, and serum creatinine less than 1.5.  Goal of Therapy:  Reduce risk of stroke and systemic embolism Monitor platelets by anticoagulation protocol: Yes  Plan:  Apixaban  5 mg orally twice daily With no dose adjustment anticipated, Pharmacy will sign off consult and continue to monitor CBC as ordered by provider along with signs/symptoms of bleeding.  Thank you for allowing pharmacy to be a part of this patients care.  Eleanor EMERSON Agent, PharmD, BCPS Clinical Pharmacist [place] 03/14/2024 5:52 PM       [1]  Allergies Allergen Reactions   Codeine Nausea And Vomiting    Can take if takes phernergan   Hydrocodone  Nausea Only   Lamotrigine      dizziness, shakiness and just feeling not right.   Macrobid  [Nitrofurantoin ] Diarrhea and Nausea And Vomiting   Robaxin  [Methocarbamol ] Rash

## 2024-03-14 NOTE — Progress Notes (Signed)
 Orthopedics Progress Note  Subjective: Patient complains of right shoulder pain 4/10. She has been itching all night as well  Objective:  Vitals:   03/14/24 0505 03/14/24 0825  BP: 131/82 (!) 140/78  Pulse: (!) 56 71  Resp:    Temp: 97.7 F (36.5 C) 98.8 F (37.1 C)  SpO2: 100%     General: Awake and alert  Musculoskeletal: right shoulder incision CDI, Bandage changed to Aquacel Neurovascularly intact  Lab Results  Component Value Date   WBC 9.6 03/14/2024   HGB 12.3 03/14/2024   HCT 40.9 03/14/2024   MCV 99.8 03/14/2024   PLT 255 03/14/2024       Component Value Date/Time   NA 138 03/14/2024 0414   NA 144 03/02/2024 1026   K 4.1 03/14/2024 0414   CL 101 03/14/2024 0414   CO2 24 03/14/2024 0414   GLUCOSE 107 (H) 03/14/2024 0414   BUN 26 (H) 03/14/2024 0414   BUN 23 03/02/2024 1026   CREATININE 1.27 (H) 03/14/2024 0414   CREATININE 0.94 01/05/2024 1348   CALCIUM  8.8 (L) 03/14/2024 0414   GFRNONAA 45 (L) 03/14/2024 0414   GFRAA >60 08/24/2019 0417    Lab Results  Component Value Date   INR 1.1 08/11/2019   INR 0.96 05/07/2015   INR 0.87 07/28/2013    Assessment/Plan: POD #1 s/p Procedures: REVISION, REVERSE TOTAL ARTHROPLASTY, SHOULDER Likely allergy  to Oxycodone  - will switch back to tramadol  and d/c oxy Dilaudid  IV as back up Patient has converted back to NSR Drip stopped. Appreciate internal medicine support. Cards consult pending for any changes to medications, thank you!  Would prefer not to anticoagulate if possible. Call me with questions on this [phone]  Elspeth SAUNDERS. Kay, MD 03/14/2024 8:42 AM

## 2024-03-14 NOTE — Plan of Care (Signed)
" °  Problem: Pain Managment: Goal: General experience of comfort will improve and/or be controlled Outcome: Progressing   Problem: Skin Integrity: Goal: Risk for impaired skin integrity will decrease Outcome: Progressing   Problem: Education: Goal: Knowledge of the prescribed therapeutic regimen will improve Outcome: Progressing   "

## 2024-03-14 NOTE — Progress Notes (Signed)
 Called due to decreased responsiveness, and hypotension. Patient A/O X 4, responsive to voice but will fall asleep if not stimulated. Lowest recorded BP with SPB in 60's, MD ordered Solu-cortef  50mg  IV and 1 liter fluid bolus. After administering Solu-cortef  BP began to rebound. Patient still drowsy. Last set of vitals taken by myself are included in this note.   1500 bedside RN notified myself and MD that patient is less drowsy and more responsive. VBG also noted to be WNL.   Continue to monitor patient and notify MD and Rapid Response if patients condition worsens.    03/14/24 1400  Vitals  BP 120/73  MAP (mmHg) 88  BP Location Left Arm  BP Method Automatic  Patient Position (if appropriate) Lying  Pulse Rate 65  Pulse Rate Source Monitor  ECG Heart Rate 63  Resp 16  Level of Consciousness  Level of Consciousness Responds to Voice  Oxygen Therapy  SpO2 95 %  O2 Device Nasal Cannula  O2 Flow Rate (L/min) 3 L/min  Patient Activity (if Appropriate) In bed  Pulse Oximetry Type Intermittent  MEWS Score  MEWS Temp 0  MEWS Systolic 0  MEWS Pulse 0  MEWS RR 0  MEWS LOC 1  MEWS Score 1  MEWS Score Color [name]

## 2024-03-15 ENCOUNTER — Other Ambulatory Visit (HOSPITAL_COMMUNITY): Payer: Self-pay

## 2024-03-15 MED ORDER — FUROSEMIDE 10 MG/ML IJ SOLN
40.0000 mg | Freq: Once | INTRAMUSCULAR | Status: AC
  Administered 2024-03-15: 40 mg via INTRAVENOUS
  Filled 2024-03-15: qty 4

## 2024-03-15 MED ORDER — TELMISARTAN 80 MG PO TABS
80.0000 mg | ORAL_TABLET | Freq: Every day | ORAL | 2 refills | Status: AC
  Filled 2024-03-15: qty 30, 30d supply, fill #0

## 2024-03-15 MED ORDER — FUROSEMIDE 40 MG PO TABS
20.0000 mg | ORAL_TABLET | Freq: Every day | ORAL | 3 refills | Status: DC | PRN
  Filled 2024-03-15: qty 45, 90d supply, fill #0

## 2024-03-15 MED ORDER — APIXABAN 5 MG PO TABS: 5.0000 mg | ORAL_TABLET | Freq: Two times a day (BID) | ORAL | 1 refills | Status: DC

## 2024-03-15 MED ORDER — POTASSIUM CHLORIDE ER 10 MEQ PO TBCR
10.0000 meq | EXTENDED_RELEASE_TABLET | Freq: Every day | ORAL | 2 refills | Status: AC
  Filled 2024-03-15: qty 30, 30d supply, fill #0

## 2024-03-15 MED ORDER — TRAMADOL HCL 50 MG PO TABS
50.0000 mg | ORAL_TABLET | Freq: Four times a day (QID) | ORAL | 0 refills | Status: AC | PRN
  Filled 2024-03-15: qty 60, 10d supply, fill #0

## 2024-03-15 MED ORDER — TELMISARTAN-HCTZ 40-12.5 MG PO TABS
1.0000 | ORAL_TABLET | Freq: Every day | ORAL | 1 refills | Status: DC
  Filled 2024-03-15: qty 30, 30d supply, fill #0

## 2024-03-15 MED ORDER — FUROSEMIDE 40 MG PO TABS
80.0000 mg | ORAL_TABLET | Freq: Every day | ORAL | 2 refills | Status: AC
  Filled 2024-03-15: qty 60, 30d supply, fill #0

## 2024-03-15 MED ORDER — METOPROLOL SUCCINATE ER 25 MG PO TB24
25.0000 mg | ORAL_TABLET | Freq: Every day | ORAL | 2 refills | Status: AC
  Filled 2024-03-15: qty 30, 30d supply, fill #0

## 2024-03-15 MED ORDER — TRAMADOL HCL 50 MG PO TABS: 50.0000 mg | ORAL_TABLET | Freq: Four times a day (QID) | ORAL | 0 refills | Status: DC | PRN

## 2024-03-15 MED ORDER — METOPROLOL SUCCINATE ER 25 MG PO TB24
12.5000 mg | ORAL_TABLET | Freq: Every day | ORAL | 11 refills | Status: DC
  Filled 2024-03-15: qty 15, 30d supply, fill #0

## 2024-03-15 MED ORDER — APIXABAN 5 MG PO TABS
5.0000 mg | ORAL_TABLET | Freq: Two times a day (BID) | ORAL | 1 refills | Status: AC
  Filled 2024-03-15 – 2024-03-16 (×2): qty 60, 30d supply, fill #0

## 2024-03-15 NOTE — Progress Notes (Signed)
 Discharge medications delivered to patient at the bedside in a secure bag.

## 2024-03-15 NOTE — Anesthesia Postprocedure Evaluation (Signed)
"   Anesthesia Post Note  Patient: Connie Haynes  Procedure(s) Performed: REVISION, REVERSE TOTAL ARTHROPLASTY, SHOULDER (Right: Shoulder)     Patient location during evaluation: PACU Anesthesia Type: General Level of consciousness: awake and alert Pain management: pain level controlled Vital Signs Assessment: post-procedure vital signs reviewed and stable Respiratory status: spontaneous breathing, nonlabored ventilation, respiratory function stable and patient connected to nasal cannula oxygen Cardiovascular status: blood pressure returned to baseline and stable Postop Assessment: no apparent nausea or vomiting Anesthetic complications: no   No notable events documented.            Lynwood MARLA Cornea      "

## 2024-03-15 NOTE — Progress Notes (Signed)
 Orthopedics Progress Note  Subjective: Pain controlled this AM.   Objective:  Vitals:   03/14/24 1940 03/15/24 0417  BP: (!) 129/50 107/64  Pulse: 77 76  Resp:  17  Temp: 98.1 F (36.7 C) 98 F (36.7 C)  SpO2: 93% 94%    General: Awake and alert  Musculoskeletal: Right shoulder Aquacel dressing in place with minimal spotting Neurovascularly intact  Lab Results  Component Value Date   WBC 9.6 03/14/2024   HGB 12.3 03/14/2024   HCT 40.9 03/14/2024   MCV 99.8 03/14/2024   PLT 255 03/14/2024       Component Value Date/Time   NA 138 03/14/2024 0414   NA 144 03/02/2024 1026   K 4.1 03/14/2024 0414   CL 101 03/14/2024 0414   CO2 24 03/14/2024 0414   GLUCOSE 107 (H) 03/14/2024 0414   BUN 26 (H) 03/14/2024 0414   BUN 23 03/02/2024 1026   CREATININE 1.27 (H) 03/14/2024 0414   CREATININE 0.94 01/05/2024 1348   CALCIUM  8.8 (L) 03/14/2024 0414   GFRNONAA 45 (L) 03/14/2024 0414   GFRAA >60 08/24/2019 0417    Lab Results  Component Value Date   INR 1.1 08/11/2019   INR 0.96 05/07/2015   INR 0.87 07/28/2013    Assessment/Plan: POD #2 s/p Procedures: REVISION, REVERSE TOTAL ARTHROPLASTY, SHOULDER Discharge to home later this morning Start anticoagulation for A Fib Follow up in two weeks in the office  Elspeth R. Kay, MD 03/15/2024 7:54 AM

## 2024-03-15 NOTE — Progress Notes (Signed)
 Discharge instructions reviewed with patient and patient's family, verbalized understanding. All questions answered. All belongings accounted for. Patient to follow up with MD in  1 weeks.   Waiting for delivery of TOC meds from outpatient pharmacy

## 2024-03-15 NOTE — Discharge Summary (Signed)
 "  In most cases prophylactic antibiotics for Dental procdeures after total joint surgery are not necessary.  Exceptions are as follows:  1. History of prior total joint infection  2. Severely immunocompromised (Organ Transplant, cancer chemotherapy, Rheumatoid biologic meds such as Humera)  3. Poorly controlled diabetes (A1C &gt; 8.0, blood glucose over 200)  If you have one of these conditions, contact your surgeon for an antibiotic prescription, prior to your dental procedure. Orthopedic Discharge Summary        Physician Discharge Summary  Patient ID: Connie Haynes MRN: [id number] DOB/AGE: 06-14-1955 69 y.o.  Admit date: 03/13/2024 Discharge date: 03/15/2024   Procedures:  Procedures (LRB): REVISION, REVERSE TOTAL ARTHROPLASTY, SHOULDER (Right)  Attending Physician:  Dr. Elspeth Her  Admission Diagnoses:   Right shoulder pain and instability following revision reverse TSA  Discharge Diagnoses:  same   Past Medical History:  Diagnosis Date   A-fib [facility])    Adrenal insufficiency    Anemia    denies   Anxiety    Basal cell carcinoma    eye lid   Cataract    CHF (congestive heart failure) (HCC)    Chronic kidney disease, stage 3 (HCC)    Cough    hx. dry cough chronic   Disorder of tear duct system    frequent tearing of eyes   GERD (gastroesophageal reflux disease)    H/O: whooping cough 2000   History of kidney infection 2015   ? sepsis   History of kidney stones    History of kidney stones    Hyperlipidemia    Hypertension    IBS (irritable bowel syndrome)    controlled with med   Osteoarthritis    arthritis -joints-limited ROM shoulders more on right   PONV (postoperative nausea and vomiting)     per pt hx of low BP following shoulder surgery in 05-2015; was managed in hospital with IV fluids and d/c BP being taken a tthe time; pt was dced and F/U with endcrinoopgist who dx with adrenal insurffiency and started steroid therapy; treatmetnt with  positive outome, now managed chronically on hydrocortisone  tablets;   Sleep apnea    no longer using cpap   Stroke (HCC)    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 Patient tolerated the procedure well and brought to the recovery room in stable condition and subsequently to the tele floor. Patient had A Fib that started in the OR and then resolved on the floor. Internal medicine and cardiology consulted for medical management. After feeling better and remaining in sinus rhythm and getting her started on anticoagulation she was stable for discharge.Follow up with ORtho in two weeks and cardiology post discharge   Disposition: Discharge disposition: 01-Home or Self Care      with follow up in 2 weeks    Follow-up Information     Her Kemps, MD. Call in 2 week(s).   Specialty: Orthopedic Surgery Why: please call [phone] for appt in two weeks Contact information: [address]  [address] [place] [address] [phone]                 Dental Antibiotics:  In most cases prophylactic antibiotics for Dental procdeures after total joint surgery are not necessary.  Exceptions are as follows:  1. History of prior total joint infection  2. Severely immunocompromised (Organ Transplant, cancer chemotherapy, Rheumatoid biologic meds such as Humera)  3. Poorly controlled diabetes (A1C &gt; 8.0, blood glucose over 200)  If you have one of these conditions, contact your surgeon for an antibiotic prescription,  prior to your dental procedure.  Discharge Instructions     Call MD / Call 911   Complete by: As directed    If you experience chest pain or shortness of breath, CALL 911 and be transported to the hospital emergency room.  If you develope a fever above 101 F, pus (white drainage) or increased drainage or redness at the wound, or calf pain, call your surgeon's office.   Constipation Prevention   Complete by: As directed    Drink plenty of fluids.  Prune juice may be helpful.  You may use a stool softener, such as Colace (over the counter) 100 mg twice a day.  Use MiraLax  (over the counter) for constipation as needed.   Increase activity slowly as tolerated   Complete by: As directed    Post-operative opioid taper instructions:   Complete by: As directed    POST-OPERATIVE OPIOID TAPER INSTRUCTIONS: It is important to wean off of your opioid medication as soon as possible. If you do not need pain medication after your surgery it is ok to stop day one. Opioids include: Codeine, Hydrocodone (Norco, Vicodin), Oxycodone (Percocet, oxycontin ) and hydromorphone  amongst others.  Long term and even short term use of opiods can cause: Increased pain response Dependence Constipation Depression Respiratory depression And more.  Withdrawal symptoms can include Flu like symptoms Nausea, vomiting And more Techniques to manage these symptoms Hydrate well Eat regular healthy meals Stay active Use relaxation techniques(deep breathing, meditating, yoga) Do Not substitute Alcohol to help with tapering If you have been on opioids for less than two weeks and do not have pain than it is ok to stop all together.  Plan to wean off of opioids This plan should start within one week post op of your joint replacement. Maintain the same interval or time between taking each dose and first decrease the dose.  Cut the total daily intake of opioids by one tablet each day Next start to increase the time between  doses. The last dose that should be eliminated is the evening dose.          Allergies as of 03/15/2024       Reactions   Codeine Nausea And Vomiting   Can take if takes phernergan   Hydrocodone  Nausea Only   Lamotrigine     dizziness, shakiness and just feeling not right.   Macrobid  [nitrofurantoin ] Diarrhea, Nausea And Vomiting   Robaxin  [methocarbamol ] Rash        Medication List     STOP taking these medications    metoprolol  tartrate 50 MG tablet Commonly known as: LOPRESSOR        TAKE these medications    pramipexole  1 MG tablet Commonly known as: MIRAPEX  Take 1 tablet by mouth twice daily The timing of this medication is very important.   amoxicillin  500 MG capsule Commonly known as: AMOXIL  Take 2,000 mg by mouth See admin instructions. 1 hour prior to dental procedure  apixaban  5 MG Tabs tablet Commonly known as: ELIQUIS  Take 1 tablet (5 mg total) by mouth 2 (two) times daily.   carvedilol  25 MG tablet Commonly known as: COREG  Take 1 tablet by mouth twice daily   citalopram  40 MG tablet Commonly known as: CELEXA  Take 1 tablet (40 mg total) by mouth daily.   fosfomycin  3 g Pack Commonly known as: MONUROL  TAKE 3 GRAMS BY MOUTH EVERY 10 DAYS   furosemide  40 MG tablet Commonly known as: LASIX  TAKE 2 TABLETS BY MOUTH EVERY OTHER DAY What changed: See the new instructions.   hydrocortisone  10 MG tablet Commonly known as: CORTEF  Take 10 mg by mouth 2 (two) times daily. Take 10mg  in the AM and 10mg  at noon.   Linzess  72 MCG capsule Generic drug: linaclotide  Take 1 capsule (72 mcg total) by mouth daily before breakfast. What changed: when to take this   LORazepam  0.5 MG tablet Commonly known as: ATIVAN  Take 1 tablet by mouth twice daily as needed What changed:  when to take this reasons to take this   MULTIVITAMIN ADULT PO Take 1 tablet by mouth daily.   potassium chloride  10 MEQ tablet Commonly known as: KLOR-CON  Take 10 mEq by  mouth as needed. WITH LASIX    predniSONE  20 MG tablet Commonly known as: DELTASONE  Take 3 pills po the day before infusions.   RITUXAN IV Inject into the vein.   rosuvastatin  10 MG tablet Commonly known as: CRESTOR  Take 1 tablet (10 mg total) by mouth daily.   telmisartan -hydrochlorothiazide  80-12.5 MG tablet Commonly known as: MICARDIS  HCT Take 1 tablet by mouth daily.   tiZANidine  4 MG tablet Commonly known as: ZANAFLEX  TAKE 1 TO 1 & 1/2 (ONE & ONE-HALF) TABLETS BY MOUTH THREE TIMES DAILY AS NEEDED What changed:  how much to take how to take this when to take this additional instructions   traMADol  50 MG tablet Commonly known as: ULTRAM  Take 1-2 tablets (50-100 mg total) by mouth every 6 (six) hours as needed for severe pain (pain score 7-10). What changed:  how much to take when to take this reasons to take this   Vitamin D  (Ergocalciferol ) 1.25 MG (50000 UNIT) Caps capsule Commonly known as: DRISDOL  Take 50,000 Units by mouth once a week.          Signed: [name] 03/15/2024, 8:00 AM  [facility] is now [facility] [address], [address], [place], [place] [address] Phone: [phone] Facebook  Instagram  LinkedIn  Twitter     "

## 2024-03-15 NOTE — Progress Notes (Addendum)
 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  She went into A-fib RVR after inducing anesthesia and was given a beta-blocker and was started on a Cardizem  drip she then converted to sinus rhythm and drip has been stopped.  Patient was able to finish the surgery. We were consulted by Ortho.   Assessment & Plan:   Principal Problem:   Atrial fibrillation, persistent (HCC) Active Problems:   S/P shoulder replacement, right   Atrial fibrillation with rapid ventricular response (HCC)   #1 status post reverse total arthroplasty right shoulder revision Ortho following  #2 A-fib RVR with history of WPW and ablation  now in sinus rhythm starting Cardizem  drip.  Continue beta-blockers orally.  Appreciate cardiology input.  Ortho would like to hold anticoagulation if possible. TSH 4.3  #3 adrenal insufficiency continue home dose of hydrocortisone  on discharge.  #4 history of transverse myelitis and neuromyelitis on rituximab  every 6 months  #5 possible undiagnosed sleep apnea she does not have a CPAP at home she is supposed to have a sleep study done as an outpatient.  #6 essential hypertension continue Coreg  and olmesartan.   She had an episode of hypotension yesterday antihypertensives held she received a liter of fluid.  With improvement in mentation and blood pressure.  Discussed with cardiology she will be discharged on Lasix  80 mg daily olmesartan 80 mg daily potassium 10 mg daily with Toprol  25 mg daily in place of carvedilol   #7 itching  resolved Estimated body mass index is 49.92 kg/m as calculated from the following:   Height as of 03/08/24: 5' 5 (1.651 m).   Weight as of 03/08/24: 136.1 kg.  DVT prophylaxis:  scd Code Status: full Family Communication: Sister in room Disposition Plan:  Status is: Observation   Subjective: Much more awake alert sister at bedside anxious to go home slept better pain better  Objective: Vitals:   03/14/24 1354 03/14/24 1400 03/14/24 1940 03/15/24 0417  BP: 136/84 120/73 (!) 129/50 107/64  Pulse:  65 77 76  Resp: 15 16  17   Temp:   98.1 F (36.7 C) 98 F (36.7 C)  TempSrc:    Oral  SpO2:  95% 93% 94%    Intake/Output Summary (Last 24 hours) at 03/15/2024 1303 Last data filed at 03/15/2024 0956 Gross per 24 hour  Intake 995.57 ml  Output 1100 ml  Net -104.43 ml   There were no vitals filed for this visit.  Examination:  General exam: Appears in no acute distress Respiratory system: Clear to auscultation. Respiratory effort normal. Cardiovascular system: Rate regular not tachycardic Gastrointestinal system: Abdomen is distended, soft and nontender. No organomegaly or masses felt. Normal bowel sounds heard. Central nervous system: Alert and oriented. No focal neurological deficits. Extremities: Trace edema right upper extremity in sling   Data Reviewed: I have personally reviewed following labs and imaging studies  CBC: Recent Labs  Lab 03/08/24 1310 03/14/24 0414  WBC 9.4 9.6  HGB 12.9 12.3  HCT 41.0 40.9  MCV 96.5 99.8  PLT 266 255   Basic Metabolic Panel: Recent Labs  Lab 03/14/24 0414  NA 138  K 4.1  CL 101  CO2 24  GLUCOSE 107*  BUN 26*  CREATININE 1.27*  CALCIUM  8.8*  MG 2.3   GFR: Estimated Creatinine Clearance: 57.7 mL/min (A) (by C-G formula based on SCr of 1.27 mg/dL (H)). Liver Function Tests: No results for input(s): AST, ALT, ALKPHOS, BILITOT, PROT, ALBUMIN  in the last 168 hours. No results for input(s): LIPASE, AMYLASE  in the last 168 hours. No results for input(s): AMMONIA in the last 168 hours. Coagulation Profile: No results for input(s): INR, PROTIME in the last 168 hours. Cardiac Enzymes: No results for input(s): CKTOTAL, CKMB, CKMBINDEX, TROPONINI in the last 168 hours. BNP (last 3 results) No results for input(s): PROBNP in the last 8760 hours. HbA1C: No results for input(s): HGBA1C in the last 72 hours. CBG: Recent Labs  Lab 03/14/24 1400  GLUCAP 115*   Lipid Profile: No results for input(s): CHOL, HDL, LDLCALC, TRIG, CHOLHDL, LDLDIRECT in the last 72 hours. Thyroid Function Tests: Recent Labs    03/14/24 1210  TSH 4.300   Anemia Panel: No results for input(s): VITAMINB12, FOLATE, FERRITIN, TIBC, IRON, RETICCTPCT in the last 72 hours. Sepsis Labs: No results for input(s): PROCALCITON, LATICACIDVEN in the last 168 hours.  Recent Results (from the past 240 hours)  Surgical pcr screen     Status: None   Collection Time: 03/08/24  2:03 PM   Specimen: Nasal Mucosa; Nasal Swab  Result Value Ref Range Status   MRSA, PCR NEGATIVE NEGATIVE Final   Staphylococcus aureus NEGATIVE NEGATIVE Final    Comment: (NOTE) The Xpert SA Assay (FDA approved for NASAL specimens in patients 72 years of age and older), is one component of a comprehensive surveillance program. It is not intended to diagnose infection nor to guide or monitor treatment. Performed at [facility], [address] [place], [place] [address]   Aerobic/Anaerobic Culture w Gram Stain (surgical/deep wound)     Status: None (Preliminary result)   Collection Time: 03/13/24  3:06 PM   Specimen: Synovial, Right Shoulder; Body Fluid  Result Value Ref Range Status   Specimen Description   Final    SYNOVIAL R Shoulder Performed at [facility], [address], [place], [place] [address]    Special Requests   Final    NONE Performed at [facility], [address] [place], [place] [address]    Gram Stain NO WBC SEEN NO ORGANISMS SEEN   Final   Culture   Final    NO GROWTH 2 DAYS Performed at [facility], [address] [place], [place] [address]    Report Status PENDING  Incomplete     Radiology Studies: DG Shoulder Right Port Result Date: 03/13/2024 CLINICAL DATA:  068512 S/P shoulder replacement, right [id number] EXAM: RIGHT SHOULDER - 1 VIEW COMPARISON:  December 21, 2014 FINDINGS: Status post revision of a reversed RIGHT shoulder arthroplasty. Adjacent soft tissue air. Overlapping surgical staples. IMPRESSION: Status post revision of a reversed RIGHT shoulder arthroplasty. Electronically Signed   By: Corean Salter M.D.   On: 03/13/2024 16:33     Scheduled Meds:  apixaban   5 mg Oral BID   citalopram   40 mg Oral Daily   furosemide   40 mg Intravenous Once   hydrocortisone  sod succinate (SOLU-CORTEF ) inj  50 mg Intravenous Q8H   linaclotide   72 mcg Oral QHS   pramipexole   1 mg Oral BID   rosuvastatin   10 mg Oral Daily   senna  1 tablet Oral Daily   Vitamin D  (Ergocalciferol )  50,000 Units Oral Weekly   Continuous Infusions:     LOS: 1 day    Almarie KANDICE Hoots, MD  03/15/2024, 1:03 PM   "

## 2024-03-15 NOTE — Plan of Care (Signed)
  Problem: Education: Goal: Knowledge of General Education information will improve Description: Including pain rating scale, medication(s)/side effects and non-pharmacologic comfort measures Outcome: Progressing   Problem: Clinical Measurements: Goal: Ability to maintain clinical measurements within normal limits will improve Outcome: Progressing   Problem: Activity: Goal: Risk for activity intolerance will decrease Outcome: Progressing   Problem: Coping: Goal: Level of anxiety will decrease Outcome: Progressing   Problem: Pain Managment: Goal: General experience of comfort will improve and/or be controlled Outcome: Progressing   Problem: Safety: Goal: Ability to remain free from injury will improve Outcome: Progressing

## 2024-03-15 NOTE — Progress Notes (Addendum)
 " Progress Note  Patient Name: Connie Haynes Date of Encounter: 03/15/2024  Primary Cardiologist: Annabella Scarce, MD  Subjective   Denies CP or SOB. Hopeful for DC today. Somewhat wheezy on exam. Had hypotension yesterday prompting DC of BP meds in afternoon, f/u BP 107/64 this AM. Has DC order in. I sent msg to primary team/nurse/ortho not to DC until receiving follow-up notification from our team, need to discuss recs with MD  Inpatient Medications    Scheduled Meds:  apixaban   5 mg Oral BID   citalopram   40 mg Oral Daily   hydrocortisone  sod succinate (SOLU-CORTEF ) inj  50 mg Intravenous Q8H   linaclotide   72 mcg Oral QHS   pramipexole   1 mg Oral BID   rosuvastatin   10 mg Oral Daily   senna  1 tablet Oral Daily   Vitamin D  (Ergocalciferol )  50,000 Units Oral Weekly   Continuous Infusions:  PRN Meds: acetaminophen , hydrOXYzine , LORazepam , menthol  **OR** phenol, metoCLOPramide  **OR** metoCLOPramide  (REGLAN ) injection, ondansetron  **OR** ondansetron  (ZOFRAN ) IV, polyethylene glycol, traMADol    Vital Signs    Vitals:   03/14/24 1354 03/14/24 1400 03/14/24 1940 03/15/24 0417  BP: 136/84 120/73 (!) 129/50 107/64  Pulse:  65 77 76  Resp: 15 16  17   Temp:   98.1 F (36.7 C) 98 F (36.7 C)  TempSrc:    Oral  SpO2:  95% 93% 94%    Intake/Output Summary (Last 24 hours) at 03/15/2024 1022 Last data filed at 03/15/2024 0956 Gross per 24 hour  Intake 995.57 ml  Output 1100 ml  Net -104.43 ml      03/08/2024   12:59 PM 02/24/2024    9:42 AM 01/25/2024    8:18 AM  Last 3 Weights  Weight (lbs) 300 lb 305 lb 291 lb  Weight (kg) 136.079 kg 138.347 kg 131.997 kg     Telemetry    No longer on telemetry - Personally Reviewed   Physical Exam   GEN: No acute distress. Morbidly obese. HEENT: Normocephalic, atraumatic, sclera non-icteric. Neck: No JVD or bruits. Cardiac: RRR no murmurs, rubs, or gallops.  Respiratory: Diffuse quiet wheezing to expiration bilaterally.  Breathing is unlabored. GI: Soft, nontender, non-distended, BS +x 4. MS: no deformity. Extremities: No clubbing or cyanosis. 1+ BLE edema. Distal pedal pulses are 2+ and equal bilaterally. Neuro:  AAOx3. Follows commands. Psych:  Responds to questions appropriately with a normal affect.  Labs    High Sensitivity Troponin:  No results for input(s): TROPONINIHS in the last 720 hours.    Cardiac EnzymesNo results for input(s): TROPONINI in the last 168 hours. No results for input(s): TROPIPOC in the last 168 hours.   Chemistry Recent Labs  Lab 03/14/24 0414  NA 138  K 4.1  CL 101  CO2 24  GLUCOSE 107*  BUN 26*  CREATININE 1.27*  CALCIUM  8.8*  GFRNONAA 45*  ANIONGAP 13     Hematology Recent Labs  Lab 03/08/24 1310 03/14/24 0414  WBC 9.4 9.6  RBC 4.25 4.10  HGB 12.9 12.3  HCT 41.0 40.9  MCV 96.5 99.8  MCH 30.4 30.0  MCHC 31.5 30.1  RDW 12.9 12.7  PLT 266 255    BNPNo results for input(s): BNP, PROBNP in the last 168 hours.   DDimer No results for input(s): DDIMER in the last 168 hours.   Radiology    DG Shoulder Right Port Result Date: 03/13/2024 CLINICAL DATA:  068512 S/P shoulder replacement, right [id number] EXAM: RIGHT SHOULDER - 1 VIEW  COMPARISON:  December 21, 2014 FINDINGS: Status post revision of a reversed RIGHT shoulder arthroplasty. Adjacent soft tissue air. Overlapping surgical staples. IMPRESSION: Status post revision of a reversed RIGHT shoulder arthroplasty. Electronically Signed   By: Corean Salter M.D.   On: 03/13/2024 16:33    Cardiac Studies   2d echo 03/10/24   1. Left ventricular ejection fraction, by estimation, is 60 to 65%. The  left ventricle has normal function. The left ventricle has no regional  wall motion abnormalities. Left ventricular diastolic parameters were  normal.   2. Right ventricular systolic function is normal. The right ventricular  size is normal. There is mildly elevated pulmonary artery systolic   pressure. The estimated right ventricular systolic pressure is 44.0 mmHg.   3. Right atrial size was mildly dilated.   4. The mitral valve is normal in structure. Trivial mitral valve  regurgitation. No evidence of mitral stenosis.   5. Tricuspid valve regurgitation is mild to moderate.   6. The aortic valve is tricuspid. Aortic valve regurgitation is trivial.  No aortic stenosis is present.   7. The inferior vena cava is dilated in size with >50% respiratory  variability, suggesting right atrial pressure of 8 mmHg.   Patient Profile     69 y.o. 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 Admitted for shoulder surgery. Shortly after inductino of anesthesia, developed AF RVR -> s/p BB/diltiazem , converted to NSR. Also had recent outpatient palpitations as well. Started on anticoagulation. She also had an episode of hypotension yesterday requiring stress dose steroids and holding of antihypertensives.  Assessment & Plan    1. Paroxysmal atrial fibrillation, with prior hx of WPW s/p ablation - prior remote history of such, with recent outpatient palpitations/Apple Watch notifications of atrial fib, and then recognition of AF RVR upon induction of surgery - converted to NSR after IV diltiazem  - no longer on telemetry but in NSR on exam today - had episode of hypotension yesterday, requiring d/c of antihypertensives - DC med rec currently lists to resume carvedilol  25mg  BID, telmisartan  hydrochlorothiazide  and Lasix , asked primary teams to hold on DC pending MD review - also with some wheezing this AM - would benefit from EP f/u as OP, will send msg to EP scheduler to call pt with appt info  2. Wheezing - will  review further with MD, may need to consider alternative BB  3. HTN with hypotension this admission - suspect multifactorial  4. Chronic BLE edema - takes Lasix  as needed approximately 2x/week - patient states a little worse today than usual, will review recs with MD - recent echo with normal LVEF, normal RV, mild pHTN, mild-moderate TR -> findings suspicious for untreated OSA, pending OP sleep study  5. Mild CAD - recently started on rosuvastatin  by Dr. Raford with plan for f/u 05/2024 - no ASA given concomitant Eliquis   To be seen by MD - Dr. Floretta  Addendum: per Dr. Floretta - plan IV Lasix  40mg  x1 prior to DC, then send home on: Lasix  80mg  once a day Continue telmistan 40 WITHOUT HCTZ Kcl 10meq once a day New Toprol  25mg  daily in place of carvedilol  Sent to [facility] Notified IM to refresh DC summary and also address apixaban /tramadol  sent to other pharmacy earlier if desired to  [facility]  Have arranged gen cards f/u at [facility] with APP on 2/10 (no availability at [facility]) Will send EP scheduler a msg to get back in with EP APP Keep f/u 05/2024 with Dr. Raford  For questions or updates, please contact [facility] Please consult [email or web address] for contact info under Cardiology/STEMI.  Signed, Jarreau Callanan N Lerin Jech, PA-C 03/15/2024, 10:22 AM    "

## 2024-03-16 ENCOUNTER — Telehealth: Payer: Self-pay

## 2024-03-16 ENCOUNTER — Other Ambulatory Visit (HOSPITAL_COMMUNITY): Payer: Self-pay

## 2024-03-16 NOTE — Transitions of Care (Post Inpatient/ED Visit) (Signed)
 "  03/16/2024  Name: Connie Haynes MRN: [id number] DOB: 02/19/1955  Today's TOC FU Call Status: Today's TOC FU Call Status:: Successful TOC FU Call Completed TOC FU Call Complete Date: 03/16/24  Patient's Name and Date of Birth confirmed. Name, DOB  Transition Care Management Follow-up Telephone Call Date of Discharge: 03/15/24 Discharge Facility: [facility]) Type of Discharge: Inpatient Admission Primary Inpatient Discharge Diagnosis:: Reverse Shoulder Arthropasty How have you been since you were released from the hospital?: Same Any questions or concerns?: Yes Patient Questions/Concerns:: Pain medication Patient Questions/Concerns Addressed: Notified Provider of Patient Questions/Concerns  Items Reviewed: Did you receive and understand the discharge instructions provided?: Yes Medications obtained,verified, and reconciled?: Yes (Medications Reviewed) Any new allergies since your discharge?: No Dietary orders reviewed?: NA Do you have support at home?: Yes People in Home [RPT]: spouse Name of Support/Comfort Primary Source: [name]  Medications Reviewed Today: Medications Reviewed Today     Reviewed by Moises Reusing, RN (Case Manager) on 03/16/24 at 1254  Med List Status: <None>   Medication Order Taking? Sig Documenting Provider Last Dose Status Informant  amoxicillin  (AMOXIL ) 500 MG capsule [id number]  Take 2,000 mg by mouth See admin instructions. 1 hour prior to dental procedure [provider]  Active Self, Pharmacy Records  apixaban  (ELIQUIS ) 5 MG TABS tablet [id number]  Take 1 tablet (5 mg total) by mouth 2 (two) times daily. [name], MD  Active   chlorhexidine  (HIBICLENS ) 4 % liquid 4 application [id number]   [name], PA-C  Active   citalopram  (CELEXA ) 40 MG tablet [id number]  Take 1 tablet (40 mg total) by mouth daily. [name], NP  Active Self, Pharmacy Records  fosfomycin  (MONUROL ) 3 g [name]  TAKE 3 GRAMS BY  MOUTH EVERY 10 DAYS Stoneking, Cain PARAS., MD  Active Self, Pharmacy Records  furosemide  (LASIX ) 40 MG tablet [id number] Yes Take 2 tablets (80 mg total) by mouth daily. Dunn, Dayna N, PA-C  Active   hydrocortisone  (CORTEF ) 10 MG tablet [id number]  Take 10 mg by mouth 2 (two) times daily. Take 10mg  in the AM and 10mg  at noon. [provider]  Active Self, Pharmacy Records  LINZESS  72 MCG capsule [id number]  Take 1 capsule (72 mcg total) by mouth daily before breakfast.  Patient taking differently: Take 72 mcg by mouth at bedtime.   [name], NP  Active Self, Pharmacy Records  LORazepam  (ATIVAN ) 0.5 MG tablet [id number]  Take 1 tablet by mouth twice daily as needed  Patient taking differently: Take 0.5 mg by mouth daily as needed (nerve pain).   Sater, Charlie LABOR, MD  Active Self, Pharmacy Records  metoprolol  succinate (TOPROL  XL) 25 MG 24 hr tablet [id number] Yes Take 1 tablet (25 mg total) by mouth daily. Dunn, Dayna N, PA-C  Active   Multiple Vitamin (MULTIVITAMIN ADULT PO) [id number]  Take 1 tablet by mouth daily. [provider]  Active Self, Pharmacy Records  potassium chloride  (KLOR-CON ) 10 MEQ tablet [id number] Yes Take 1 tablet (10 mEq total) by mouth daily. With furosemide /Lasix  Dunn, Dayna N, PA-C  Active   pramipexole  (MIRAPEX ) 1 MG tablet [id number]  Take 1 tablet by mouth twice daily Sater, Charlie LABOR, MD  Active Self, Pharmacy Records  predniSONE  (DELTASONE ) 20 MG tablet [id number]  Take 3 pills po the day before infusions.  Patient not taking: Reported on 03/02/2024   Vear Charlie LABOR, MD  Active Self, Pharmacy Records  riTUXimab  [facility] IV) [id number]  Inject  into the vein. [provider]  Active Self, Pharmacy Records  rosuvastatin  (CRESTOR ) 10 MG tablet [id number]  Take 1 tablet (10 mg total) by mouth daily.  Patient not taking: Reported on 03/02/2024   Raford Riggs, MD  Active Self, Pharmacy Records  technetium tetrofosmin  (TC-MYOVIEW ) injection 31.7  milli Curie [id number]   Maranda Leim DEL, MD  Active   telmisartan  (MICARDIS ) 80 MG tablet [id number] Yes Take 1 tablet (80 mg total) by mouth daily. Dunn, Dayna N, PA-C  Active   tiZANidine  (ZANAFLEX ) 4 MG tablet [id number]  TAKE 1 TO 1 & 1/2 (ONE & ONE-HALF) TABLETS BY MOUTH THREE TIMES DAILY AS NEEDED  Patient taking differently: Take 4 mg by mouth at bedtime. Patient takes 1 tablet every night at bedtime and 1 tablet in the afternoon if needed TAKE 1 TO 1 & 1/2 (ONE & ONE-HALF) TABLETS BY MOUTH THREE TIMES DAILY AS NEEDED   Sater, Charlie LABOR, MD  Active Self, Pharmacy Records  traMADol  (ULTRAM ) 50 MG tablet [id number] Yes Take 1-2 tablets (50-100 mg total) by mouth every 6 (six) hours as needed for severe pain (pain score 7-10). [name], MD  Active   Vitamin D , Ergocalciferol , (DRISDOL ) 1.25 MG (50000 UNIT) CAPS capsule [id number]  Take 50,000 Units by mouth once a week. [provider]  Active Self, Pharmacy Records            Home Care and Equipment/Supplies: Were Home Health Services Ordered?: No Any new equipment or medical supplies ordered?: No  Functional Questionnaire: Do you need assistance with bathing/showering or dressing?: Yes (Right shoulder limitations) Do you need assistance with meal preparation?: Yes (Right shoulder limitations) Do you need assistance with eating?: No Do you have difficulty maintaining continence: No Do you need assistance with getting out of bed/getting out of a chair/moving?: No Do you have difficulty managing or taking your medications?: No  Follow up appointments reviewed: PCP Follow-up appointment confirmed?: Yes Date of PCP follow-up appointment?: 03/28/24 Follow-up Provider: [name] Specialist [name] Follow-up appointment confirmed?: Yes Date of Specialist follow-up appointment?: 03/21/24 Follow-Up Specialty Provider:: [name] Do you need transportation to your follow-up appointment?: No Do you understand  care options if your condition(s) worsen?: Yes-patient verbalized understanding  SDOH Interventions Today    Flowsheet Row Most Recent Value  SDOH Interventions   Food Insecurity Interventions Intervention Not Indicated  Housing Interventions Intervention Not Indicated  Transportation Interventions Intervention Not Indicated  Utilities Interventions Intervention Not Indicated    Goals Addressed             This Visit's Progress    VBCI Transitions of Care (TOC) Care Plan       Problems:  Recent Hospitalization for treatment of Atrial Fibrillation and Right Shoulder Arthroplasty Knowledge Deficit Related to A-Fib  Goal:  Over the next 30 days, the patient will not experience hospital readmission  Interventions:   AFIB Interventions:   Reviewed importance of adherence to anticoagulant exactly as prescribed Advised patient to discuss Eliquis  and long term use  with provider Counseled on bleeding risk associated with Eliquis  and importance of self-monitoring for signs/symptoms of bleeding Counseled on seeking medical attention after a head injury or if there is blood in the urine/stool Fall Precautions and potential for bruising   Surgery 03/13/24 - Right Total Shoulder Arthroplasty by Elspeth Her Discuss Pain Management control each visit Monitor for Signs and Symptoms of infection at the incision site each visit Encourage mobility Attend Ortho follow up appointment as  scheduled  Patient Self Care Activities:  Attend all scheduled provider appointments Call pharmacy for medication refills 3-7 days in advance of running out of medications Call provider office for new concerns or questions  Notify RN Care Manager of [facility] call rescheduling needs Participate in Transition of Care Program/Attend [facility] scheduled calls Take medications as prescribed    Plan:  Telephone follow up appointment with care management team member scheduled for:  Thursday February 12th at 1:30pm         Medford Balboa, BSN, RN [place]  VBCI - Population Health RN Care Manager [phone]  "

## 2024-03-16 NOTE — Patient Instructions (Signed)
 Visit Information  Thank you for taking time to visit with me today. Please don't hesitate to contact me if I can be of assistance to you before our next scheduled telephone appointment.  Our next appointment is by telephone on Thursday February 12th at 1:30pm  Following is a copy of your care plan:   Goals Addressed             This Visit's Progress    VBCI Transitions of Care (TOC) Care Plan       Problems:  Recent Hospitalization for treatment of Atrial Fibrillation and Right Shoulder Arthroplasty Knowledge Deficit Related to A-Fib  Goal:  Over the next 30 days, the patient will not experience hospital readmission  Interventions:   AFIB Interventions:   Reviewed importance of adherence to anticoagulant exactly as prescribed Advised patient to discuss Eliquis  and long term use  with provider Counseled on bleeding risk associated with Eliquis  and importance of self-monitoring for signs/symptoms of bleeding Counseled on seeking medical attention after a head injury or if there is blood in the urine/stool Fall Precautions and potential for bruising   Surgery 03/13/24 - Right Total Shoulder Arthroplasty by Elspeth Her Discuss Pain Management control each visit Monitor for Signs and Symptoms of infection at the incision site each visit Encourage mobility Attend Ortho follow up appointment as scheduled  Patient Self Care Activities:  Attend all scheduled provider appointments Call pharmacy for medication refills 3-7 days in advance of running out of medications Call provider office for new concerns or questions  Notify RN Care Manager of [facility] call rescheduling needs Participate in Transition of Care Program/Attend [facility] scheduled calls Take medications as prescribed    Plan:  Telephone follow up appointment with care management team member scheduled for:  Thursday February 12th at 1:30pm        Patient verbalizes understanding of instructions and care plan provided today  and agrees to view in MyChart. Active MyChart status and patient understanding of how to access instructions and care plan via MyChart confirmed with patient.     The patient has been provided with contact information for the care management team and has been advised to call with any health related questions or concerns.   Please call the care guide team at [phone] if you need to cancel or reschedule your appointment.   Please call the Suicide and Crisis Lifeline: 988 call the [place]  National Suicide Prevention Lifeline: 445-524-3815 or TTY: 551-811-3275 TTY 805-803-2228) to talk to a trained counselor if you are experiencing a Mental Health or Behavioral Health Crisis or need someone to talk to.  Medford Balboa, BSN, RN [place]  VBCI - [employer] [facility] RN Care Manager [phone]

## 2024-03-17 LAB — AEROBIC/ANAEROBIC CULTURE W GRAM STAIN (SURGICAL/DEEP WOUND)
Culture: NO GROWTH
Gram Stain: NONE SEEN

## 2024-03-21 ENCOUNTER — Ambulatory Visit: Admitting: Cardiology

## 2024-03-23 ENCOUNTER — Other Ambulatory Visit (HOSPITAL_BASED_OUTPATIENT_CLINIC_OR_DEPARTMENT_OTHER)

## 2024-03-23 ENCOUNTER — Telehealth

## 2024-03-28 ENCOUNTER — Ambulatory Visit: Admitting: Family Medicine

## 2024-04-04 ENCOUNTER — Ambulatory Visit: Admitting: Dermatology

## 2024-04-07 ENCOUNTER — Other Ambulatory Visit (HOSPITAL_BASED_OUTPATIENT_CLINIC_OR_DEPARTMENT_OTHER)

## 2024-04-19 ENCOUNTER — Ambulatory Visit: Admitting: Neurology

## 2024-04-25 ENCOUNTER — Ambulatory Visit: Admitting: Urology

## 2024-04-26 ENCOUNTER — Ambulatory Visit: Admitting: Cardiovascular Disease

## 2024-05-29 ENCOUNTER — Ambulatory Visit (HOSPITAL_BASED_OUTPATIENT_CLINIC_OR_DEPARTMENT_OTHER): Admitting: Cardiovascular Disease

## 2024-08-29 ENCOUNTER — Ambulatory Visit
